# Patient Record
Sex: Female | Born: 1935
Health system: Southern US, Community
[De-identification: ages and names within clinical notes are randomized; demographics above are authoritative.]

## PROBLEM LIST (undated history)

## (undated) ENCOUNTER — Ambulatory Visit (HOSPITAL_COMMUNITY): Payer: Medicare Other

## (undated) DIAGNOSIS — I480 Paroxysmal atrial fibrillation: Secondary | ICD-10-CM

## (undated) DIAGNOSIS — E039 Hypothyroidism, unspecified: Secondary | ICD-10-CM

## (undated) DIAGNOSIS — R9430 Abnormal result of cardiovascular function study, unspecified: Secondary | ICD-10-CM

## (undated) DIAGNOSIS — Z950 Presence of cardiac pacemaker: Secondary | ICD-10-CM

## (undated) DIAGNOSIS — I10 Essential (primary) hypertension: Secondary | ICD-10-CM

## (undated) DIAGNOSIS — IMO0002 Reserved for concepts with insufficient information to code with codable children: Secondary | ICD-10-CM

## (undated) DIAGNOSIS — I4819 Other persistent atrial fibrillation: Secondary | ICD-10-CM

## (undated) DIAGNOSIS — L659 Nonscarring hair loss, unspecified: Secondary | ICD-10-CM

## (undated) DIAGNOSIS — I495 Sick sinus syndrome: Secondary | ICD-10-CM

## (undated) HISTORY — DX: Other persistent atrial fibrillation: I48.19

## (undated) HISTORY — DX: Paroxysmal atrial fibrillation: I48.0

## (undated) HISTORY — DX: Sick sinus syndrome: I49.5

## (undated) HISTORY — PX: JOINT REPLACEMENT: SHX530

## (undated) HISTORY — DX: Essential (primary) hypertension: I10

## (undated) HISTORY — DX: Abnormal result of cardiovascular function study, unspecified: R94.30

## (undated) HISTORY — PX: ABDOMINAL HYSTERECTOMY: SHX81

## (undated) HISTORY — DX: Nonscarring hair loss, unspecified: L65.9

## (undated) HISTORY — DX: Hypothyroidism, unspecified: E03.9

## (undated) HISTORY — DX: Reserved for concepts with insufficient information to code with codable children: IMO0002

---

## 2000-01-01 ENCOUNTER — Ambulatory Visit (HOSPITAL_COMMUNITY): Admission: RE | Admit: 2000-01-01 | Discharge: 2000-01-01 | Payer: Self-pay | Admitting: *Deleted

## 2001-02-18 ENCOUNTER — Encounter: Admission: RE | Admit: 2001-02-18 | Discharge: 2001-02-18 | Payer: Self-pay | Admitting: Family Medicine

## 2001-02-18 ENCOUNTER — Encounter: Payer: Self-pay | Admitting: Family Medicine

## 2001-08-15 ENCOUNTER — Ambulatory Visit (HOSPITAL_COMMUNITY): Admission: RE | Admit: 2001-08-15 | Discharge: 2001-08-15 | Payer: Self-pay | Admitting: Gastroenterology

## 2001-08-15 ENCOUNTER — Encounter (INDEPENDENT_AMBULATORY_CARE_PROVIDER_SITE_OTHER): Payer: Self-pay | Admitting: Specialist

## 2002-09-08 ENCOUNTER — Encounter: Admission: RE | Admit: 2002-09-08 | Discharge: 2002-09-08 | Payer: Self-pay | Admitting: *Deleted

## 2002-09-08 ENCOUNTER — Encounter: Payer: Self-pay | Admitting: *Deleted

## 2003-04-11 ENCOUNTER — Emergency Department (HOSPITAL_COMMUNITY): Admission: EM | Admit: 2003-04-11 | Discharge: 2003-04-11 | Payer: Self-pay | Admitting: Emergency Medicine

## 2003-05-19 ENCOUNTER — Emergency Department (HOSPITAL_COMMUNITY): Admission: EM | Admit: 2003-05-19 | Discharge: 2003-05-19 | Payer: Self-pay | Admitting: Emergency Medicine

## 2003-09-25 ENCOUNTER — Encounter: Admission: RE | Admit: 2003-09-25 | Discharge: 2003-09-25 | Payer: Self-pay | Admitting: Orthopedic Surgery

## 2003-10-14 ENCOUNTER — Ambulatory Visit (HOSPITAL_COMMUNITY): Admission: RE | Admit: 2003-10-14 | Discharge: 2003-10-14 | Payer: Self-pay | Admitting: Orthopedic Surgery

## 2004-07-05 ENCOUNTER — Ambulatory Visit: Payer: Self-pay | Admitting: Cardiology

## 2005-02-21 ENCOUNTER — Observation Stay (HOSPITAL_COMMUNITY): Admission: RE | Admit: 2005-02-21 | Discharge: 2005-02-22 | Payer: Self-pay | Admitting: Obstetrics and Gynecology

## 2005-07-09 ENCOUNTER — Ambulatory Visit: Payer: Self-pay | Admitting: Cardiology

## 2006-06-17 ENCOUNTER — Ambulatory Visit: Payer: Self-pay | Admitting: Cardiology

## 2007-06-12 HISTORY — PX: PACEMAKER PLACEMENT: SHX43

## 2007-06-16 ENCOUNTER — Ambulatory Visit: Payer: Self-pay | Admitting: Cardiology

## 2007-07-10 ENCOUNTER — Ambulatory Visit (HOSPITAL_COMMUNITY): Admission: RE | Admit: 2007-07-10 | Discharge: 2007-07-10 | Payer: Self-pay | Admitting: Internal Medicine

## 2008-03-05 ENCOUNTER — Observation Stay (HOSPITAL_COMMUNITY): Admission: EM | Admit: 2008-03-05 | Discharge: 2008-03-06 | Payer: Self-pay | Admitting: Emergency Medicine

## 2008-03-05 ENCOUNTER — Ambulatory Visit: Payer: Self-pay | Admitting: Cardiology

## 2008-03-17 ENCOUNTER — Ambulatory Visit: Payer: Self-pay

## 2008-03-17 ENCOUNTER — Encounter: Payer: Self-pay | Admitting: Cardiology

## 2008-03-22 ENCOUNTER — Ambulatory Visit: Payer: Self-pay | Admitting: Cardiology

## 2008-03-23 ENCOUNTER — Ambulatory Visit: Payer: Self-pay | Admitting: Cardiology

## 2008-04-09 ENCOUNTER — Ambulatory Visit: Payer: Self-pay | Admitting: Cardiology

## 2008-04-12 ENCOUNTER — Ambulatory Visit: Payer: Self-pay | Admitting: Internal Medicine

## 2008-04-12 LAB — CONVERTED CEMR LAB
BUN: 15 mg/dL (ref 6–23)
Basophils Absolute: 0.1 10*3/uL (ref 0.0–0.1)
Basophils Relative: 0.8 % (ref 0.0–3.0)
CO2: 32 meq/L (ref 19–32)
Calcium: 9.2 mg/dL (ref 8.4–10.5)
Chloride: 104 meq/L (ref 96–112)
Creatinine, Ser: 0.9 mg/dL (ref 0.4–1.2)
Eosinophils Absolute: 0.4 10*3/uL (ref 0.0–0.7)
Eosinophils Relative: 6.4 % — ABNORMAL HIGH (ref 0.0–5.0)
GFR calc Af Amer: 78 mL/min
GFR calc non Af Amer: 64 mL/min
Glucose, Bld: 85 mg/dL (ref 70–99)
HCT: 41 % (ref 36.0–46.0)
Hemoglobin: 14.5 g/dL (ref 12.0–15.0)
INR: 0.9 (ref 0.8–1.0)
Lymphocytes Relative: 30.8 % (ref 12.0–46.0)
MCHC: 35.3 g/dL (ref 30.0–36.0)
MCV: 93.7 fL (ref 78.0–100.0)
Monocytes Absolute: 0.9 10*3/uL (ref 0.1–1.0)
Monocytes Relative: 14.1 % — ABNORMAL HIGH (ref 3.0–12.0)
Neutro Abs: 3 10*3/uL (ref 1.4–7.7)
Neutrophils Relative %: 47.9 % (ref 43.0–77.0)
Platelets: 233 10*3/uL (ref 150–400)
Potassium: 3.9 meq/L (ref 3.5–5.1)
Prothrombin Time: 11 s (ref 10.9–13.3)
RBC: 4.37 M/uL (ref 3.87–5.11)
RDW: 11.8 % (ref 11.5–14.6)
Sodium: 141 meq/L (ref 135–145)
WBC: 6.4 10*3/uL (ref 4.5–10.5)
aPTT: 26.2 s (ref 21.7–29.8)

## 2008-04-16 ENCOUNTER — Ambulatory Visit: Payer: Self-pay | Admitting: Cardiology

## 2008-04-19 ENCOUNTER — Ambulatory Visit: Payer: Self-pay | Admitting: Internal Medicine

## 2008-04-20 ENCOUNTER — Inpatient Hospital Stay (HOSPITAL_COMMUNITY): Admission: RE | Admit: 2008-04-20 | Discharge: 2008-04-20 | Payer: Self-pay | Admitting: Internal Medicine

## 2008-04-21 ENCOUNTER — Ambulatory Visit: Payer: Self-pay | Admitting: Internal Medicine

## 2008-04-26 ENCOUNTER — Ambulatory Visit: Payer: Self-pay | Admitting: Cardiology

## 2008-04-26 ENCOUNTER — Ambulatory Visit: Payer: Self-pay

## 2008-05-10 ENCOUNTER — Ambulatory Visit: Payer: Self-pay

## 2008-05-10 ENCOUNTER — Ambulatory Visit: Payer: Self-pay | Admitting: Cardiovascular Disease

## 2008-05-19 ENCOUNTER — Ambulatory Visit: Payer: Self-pay | Admitting: Cardiology

## 2008-05-24 ENCOUNTER — Ambulatory Visit: Payer: Self-pay | Admitting: Cardiology

## 2008-06-07 ENCOUNTER — Ambulatory Visit: Payer: Self-pay | Admitting: Cardiology

## 2008-06-17 ENCOUNTER — Ambulatory Visit: Payer: Self-pay | Admitting: Cardiovascular Disease

## 2008-06-21 ENCOUNTER — Ambulatory Visit: Payer: Self-pay | Admitting: Cardiology

## 2008-07-05 ENCOUNTER — Ambulatory Visit: Payer: Self-pay | Admitting: Cardiology

## 2008-07-20 ENCOUNTER — Ambulatory Visit: Payer: Self-pay | Admitting: Internal Medicine

## 2008-08-02 ENCOUNTER — Ambulatory Visit: Payer: Self-pay | Admitting: Cardiology

## 2008-08-16 ENCOUNTER — Ambulatory Visit: Payer: Self-pay | Admitting: Cardiology

## 2008-08-23 ENCOUNTER — Ambulatory Visit: Payer: Self-pay | Admitting: Cardiology

## 2008-09-06 ENCOUNTER — Ambulatory Visit: Payer: Self-pay | Admitting: Cardiology

## 2008-09-27 ENCOUNTER — Telehealth: Payer: Self-pay | Admitting: Cardiology

## 2008-09-27 ENCOUNTER — Ambulatory Visit: Payer: Self-pay | Admitting: Cardiology

## 2008-10-01 ENCOUNTER — Encounter (INDEPENDENT_AMBULATORY_CARE_PROVIDER_SITE_OTHER): Payer: Self-pay | Admitting: *Deleted

## 2008-10-04 ENCOUNTER — Ambulatory Visit: Payer: Self-pay | Admitting: Cardiology

## 2008-10-25 ENCOUNTER — Ambulatory Visit: Payer: Self-pay | Admitting: Cardiovascular Disease

## 2008-11-05 ENCOUNTER — Encounter: Payer: Self-pay | Admitting: Cardiology

## 2008-11-09 ENCOUNTER — Encounter: Payer: Self-pay | Admitting: *Deleted

## 2008-11-22 ENCOUNTER — Ambulatory Visit: Payer: Self-pay | Admitting: Cardiology

## 2008-11-22 LAB — CONVERTED CEMR LAB
POC INR: 2.7
Protime: 19.8

## 2008-12-15 ENCOUNTER — Encounter: Payer: Self-pay | Admitting: *Deleted

## 2008-12-20 ENCOUNTER — Encounter (INDEPENDENT_AMBULATORY_CARE_PROVIDER_SITE_OTHER): Payer: Self-pay | Admitting: Cardiology

## 2008-12-20 ENCOUNTER — Ambulatory Visit: Payer: Self-pay | Admitting: Cardiovascular Disease

## 2008-12-20 LAB — CONVERTED CEMR LAB
POC INR: 2.1
Prothrombin Time: 17.9 s

## 2009-01-17 ENCOUNTER — Ambulatory Visit: Payer: Self-pay | Admitting: Cardiology

## 2009-01-17 LAB — CONVERTED CEMR LAB
POC INR: 3
Prothrombin Time: 20.8 s

## 2009-02-07 ENCOUNTER — Encounter: Payer: Self-pay | Admitting: Cardiology

## 2009-02-09 ENCOUNTER — Ambulatory Visit: Payer: Self-pay | Admitting: Cardiology

## 2009-02-09 LAB — CONVERTED CEMR LAB: POC INR: 4

## 2009-02-23 ENCOUNTER — Ambulatory Visit: Payer: Self-pay | Admitting: Cardiology

## 2009-02-23 LAB — CONVERTED CEMR LAB: POC INR: 2.7

## 2009-03-23 ENCOUNTER — Ambulatory Visit: Payer: Self-pay | Admitting: Cardiology

## 2009-03-23 LAB — CONVERTED CEMR LAB: POC INR: 2.7

## 2009-04-11 ENCOUNTER — Telehealth: Payer: Self-pay | Admitting: Cardiology

## 2009-04-20 ENCOUNTER — Ambulatory Visit: Payer: Self-pay | Admitting: Internal Medicine

## 2009-04-20 LAB — CONVERTED CEMR LAB: POC INR: 2.5

## 2009-05-10 ENCOUNTER — Telehealth (INDEPENDENT_AMBULATORY_CARE_PROVIDER_SITE_OTHER): Payer: Self-pay | Admitting: *Deleted

## 2009-05-18 ENCOUNTER — Ambulatory Visit: Payer: Self-pay | Admitting: Cardiology

## 2009-05-18 LAB — CONVERTED CEMR LAB: POC INR: 2.5

## 2009-06-10 ENCOUNTER — Encounter (INDEPENDENT_AMBULATORY_CARE_PROVIDER_SITE_OTHER): Payer: Self-pay | Admitting: Cardiology

## 2009-06-14 ENCOUNTER — Encounter: Payer: Self-pay | Admitting: Cardiology

## 2009-06-14 LAB — CONVERTED CEMR LAB
POC INR: 3.8
Prothrombin Time: 37.2 s

## 2009-06-15 ENCOUNTER — Ambulatory Visit: Payer: Self-pay | Admitting: Cardiovascular Disease

## 2009-06-15 LAB — CONVERTED CEMR LAB: POC INR: 3.2

## 2009-07-06 ENCOUNTER — Ambulatory Visit: Payer: Self-pay | Admitting: Cardiology

## 2009-07-06 LAB — CONVERTED CEMR LAB: POC INR: 3.4

## 2009-07-15 ENCOUNTER — Ambulatory Visit: Payer: Self-pay | Admitting: Internal Medicine

## 2009-07-15 LAB — CONVERTED CEMR LAB: POC INR: 2

## 2009-08-05 ENCOUNTER — Ambulatory Visit: Payer: Self-pay | Admitting: Cardiology

## 2009-08-05 LAB — CONVERTED CEMR LAB: POC INR: 2.5

## 2009-08-16 ENCOUNTER — Ambulatory Visit: Payer: Self-pay | Admitting: Cardiology

## 2009-08-31 ENCOUNTER — Telehealth: Payer: Self-pay | Admitting: Cardiology

## 2009-09-05 ENCOUNTER — Ambulatory Visit: Payer: Self-pay | Admitting: Internal Medicine

## 2009-09-05 LAB — CONVERTED CEMR LAB: POC INR: 1.2

## 2009-09-08 ENCOUNTER — Telehealth: Payer: Self-pay | Admitting: Cardiology

## 2009-09-15 ENCOUNTER — Ambulatory Visit: Payer: Self-pay | Admitting: Cardiology

## 2009-09-19 LAB — CONVERTED CEMR LAB
Basophils Absolute: 0 10*3/uL (ref 0.0–0.1)
Basophils Relative: 0.9 % (ref 0.0–3.0)
Eosinophils Absolute: 0.4 10*3/uL (ref 0.0–0.7)
Eosinophils Relative: 8.6 % — ABNORMAL HIGH (ref 0.0–5.0)
HCT: 41.5 % (ref 36.0–46.0)
Hemoglobin: 14.1 g/dL (ref 12.0–15.0)
Lymphocytes Relative: 32.6 % (ref 12.0–46.0)
Lymphs Abs: 1.6 10*3/uL (ref 0.7–4.0)
MCHC: 34.1 g/dL (ref 30.0–36.0)
MCV: 95.5 fL (ref 78.0–100.0)
Monocytes Absolute: 0.7 10*3/uL (ref 0.1–1.0)
Monocytes Relative: 13.9 % — ABNORMAL HIGH (ref 3.0–12.0)
Neutro Abs: 2.1 10*3/uL (ref 1.4–7.7)
Neutrophils Relative %: 44 % (ref 43.0–77.0)
Platelets: 218 10*3/uL (ref 150.0–400.0)
RBC: 4.35 M/uL (ref 3.87–5.11)
RDW: 13.7 % (ref 11.5–14.6)
WBC: 4.8 10*3/uL (ref 4.5–10.5)

## 2009-11-14 ENCOUNTER — Telehealth: Payer: Self-pay | Admitting: Internal Medicine

## 2009-11-21 ENCOUNTER — Emergency Department (HOSPITAL_COMMUNITY)
Admission: EM | Admit: 2009-11-21 | Discharge: 2009-11-21 | Payer: Self-pay | Source: Home / Self Care | Admitting: Emergency Medicine

## 2009-11-23 ENCOUNTER — Telehealth: Payer: Self-pay | Admitting: Cardiology

## 2010-01-03 ENCOUNTER — Telehealth: Payer: Self-pay | Admitting: Cardiology

## 2010-02-09 ENCOUNTER — Encounter: Payer: Self-pay | Admitting: Cardiology

## 2010-02-10 ENCOUNTER — Ambulatory Visit: Payer: Self-pay | Admitting: Cardiology

## 2010-02-10 ENCOUNTER — Encounter: Payer: Self-pay | Admitting: Internal Medicine

## 2010-02-15 ENCOUNTER — Telehealth: Payer: Self-pay | Admitting: Cardiology

## 2010-02-16 ENCOUNTER — Telehealth: Payer: Self-pay | Admitting: Cardiology

## 2010-03-06 ENCOUNTER — Ambulatory Visit: Payer: Self-pay | Admitting: Cardiology

## 2010-04-24 ENCOUNTER — Telehealth: Payer: Self-pay | Admitting: Cardiology

## 2010-05-10 ENCOUNTER — Encounter: Admission: RE | Admit: 2010-05-10 | Discharge: 2010-05-10 | Payer: Self-pay | Admitting: Internal Medicine

## 2010-06-15 ENCOUNTER — Ambulatory Visit
Admission: RE | Admit: 2010-06-15 | Discharge: 2010-06-15 | Payer: Self-pay | Source: Home / Self Care | Attending: Cardiology | Admitting: Cardiology

## 2010-06-19 ENCOUNTER — Telehealth: Payer: Self-pay | Admitting: Cardiology

## 2010-06-20 ENCOUNTER — Telehealth: Payer: Self-pay | Admitting: Cardiology

## 2010-07-09 LAB — CONVERTED CEMR LAB
Basophils Absolute: 0 10*3/uL (ref 0.0–0.1)
Basophils Relative: 0.8 % (ref 0.0–3.0)
Eosinophils Absolute: 0.5 10*3/uL (ref 0.0–0.7)
Eosinophils Relative: 7.9 % — ABNORMAL HIGH (ref 0.0–5.0)
HCT: 38.5 % (ref 36.0–46.0)
Hemoglobin: 13.4 g/dL (ref 12.0–15.0)
Lymphocytes Relative: 29.8 % (ref 12.0–46.0)
Lymphs Abs: 1.8 10*3/uL (ref 0.7–4.0)
MCHC: 34.8 g/dL (ref 30.0–36.0)
MCV: 95.6 fL (ref 78.0–100.0)
Monocytes Absolute: 0.8 10*3/uL (ref 0.1–1.0)
Monocytes Relative: 13.3 % — ABNORMAL HIGH (ref 3.0–12.0)
Neutro Abs: 2.8 10*3/uL (ref 1.4–7.7)
Neutrophils Relative %: 48.2 % (ref 43.0–77.0)
Platelets: 212 10*3/uL (ref 150.0–400.0)
RBC: 4.03 M/uL (ref 3.87–5.11)
RDW: 13.4 % (ref 11.5–14.6)
TSH: 0.59 microintl units/mL (ref 0.35–5.50)
WBC: 5.9 10*3/uL (ref 4.5–10.5)

## 2010-07-13 NOTE — Progress Notes (Signed)
Summary: refill request  Phone Note Refill Request Message from:  Patient on June 19, 2010 1:54 PM  Refills Requested: Medication #1:  PRADAXA 150 MG CAPS Take 1 tablet by mouth two times a day pt needs 90 day supply with a year of refills   Method Requested: Telephone to Pharmacy Initial call taken by: Glynda Jaeger,  June 19, 2010 1:55 PM    Prescriptions: PRADAXA 150 MG CAPS (DABIGATRAN ETEXILATE MESYLATE) Take 1 tablet by mouth two times a day  #180 x 3   Entered by:   Hardin Negus, RMA   Authorized by:   Talitha Givens, MD, [facility]   Signed by:   Hardin Negus, RMA on 06/20/2010   Method used:   [facility]  [address] [id number]       [address]       [place], [place]  [address]       Ph: [phone] or [phone]       Fax: [phone]   RxID:   [id number]

## 2010-07-13 NOTE — Progress Notes (Signed)
Summary: CALLING ABOUT   MEDICATION  Phone Note Call from Patient Call back at Home Phone [phone]   Caller: Patient Summary of Call: PT REQUEST CALL ABOUT A MEDICATION Initial call taken by: Judie Grieve,  August 31, 2009 1:42 PM  Follow-up for Phone Call        Called spoke with pt.  Pt has received Pradaxa in the mail.  Wishes to stop coumadin and start Pradaxa.  Instructed pt must be off coumadin x 3 days and have INR less than 2.0.  Instructed pt to take coumadin until 09/01/09.  Hold coumadin 3/25-3/28, made OV on 09/05/09 at 9:30 to check INR.  Follow-up by: Cloyde Reams RN,  August 31, 2009 2:14 PM

## 2010-07-13 NOTE — Progress Notes (Signed)
Summary: pt has questions about pacer  Phone Note Call from Patient Call back at Home Phone [phone]   Caller: Patient Reason for Call: Talk to Nurse, Talk to Doctor Summary of Call: pt has questions regarding pacer Initial call taken by: Omer Jack,  November 14, 2009 9:05 AM  Follow-up for Phone Call        Called patient and left message on machine Gypsy Balsam RN BSN  November 14, 2009 4:10 PM   Additional Follow-up for Phone Call Additional follow up Details #1::        [name] was home from [place] and had to go back a day early and patient walked through metal detector at airport.  Pt was concerned that it had affected her device.  Pt reassured.  Gypsy Balsam RN BSN  November 14, 2009 4:19 PM

## 2010-07-13 NOTE — Progress Notes (Signed)
Summary: pt wanting appt  Phone Note Call from Patient Call back at Home Phone [phone]   Caller: Patient [phone] Reason for Call: Talk to Nurse Complaint: Nausea/Vomiting/Diarrhea Summary of Call: pt calling re er visit last night bp 200/96 they didn't find anything wrong and sent her home w.instructions to see her pc/she has appt with dr Valentina Lucks tomorrow at 145p/now has dizziness,nausea, and some weakness, she however didn't tell the pc that, her brother suggested she cll and see dr Myrtis Ser today-pt advised he is not here Initial call taken by: Glynda Jaeger,  November 23, 2009 9:04 AM  Follow-up for Phone Call        spoke w/pt she will keep appt w/Dr Valentina Lucks and call us tom and let us know what he did Meredith Staggers, RN  November 23, 2009 1:49 PM

## 2010-07-13 NOTE — Assessment & Plan Note (Signed)
Summary: [name]   Visit Type:  Follow-up Primary Provider:  Kirby Funk, MD  CC:  atrial fibrillation.  History of Present Illness: The patient is seen for followup of atrial fibrillation.  She is doing well.  She has noted episodes of palpitations.  She also tells me about an episode of increased blood pressure. .  This was treated with the addition of an ACE inhibitor and then Losartan.  The pacemaker is interrogated today.  It is functioning properly.  She has had 12 episodes in the last 200 days with a burst of rapid atrial fibrillation as high as 190 beats per minute.  These spells have lasted as long as 12 minutes.  She feels this but has not had syncope or presyncope.  Current Medications (verified): 1)  Levoxyl 150 Mcg Tabs (Levothyroxine Sodium) .... Take 1 Tablet By Mouth Daily 2)  Metoprolol Succinate 25 Mg Xr24h-Tab (Metoprolol Succinate) .... Take One Tablet By Mouth Daily 3)  Rythmol Sr 225 Mg Xr12h-Cap (Propafenone Hcl) .Marland Kitchen.. 1 Tab Two Times A Day 4)  Calcium Carbonate-Vitamin D 600-400 Mg-Unit  Tabs (Calcium Carbonate-Vitamin D) .... Take 1 Tablet By Mouth Daily 5)  Fish Oil   Oil (Fish Oil) .... 1000mg  1 Tab Once Daily 6)  Ocuvite  Tabs (Multiple Vitamins-Minerals) .... Take 1 Tablet By Mouth Daily 7)  Niacin 500 Mg Tabs (Niacin) .... Take 1 Tablet By Mouth Daily 8)  Losartan Potassium-Hctz 100-25 Mg Tabs (Losartan Potassium-Hctz) .... Take 1 Tablet By Mouth Once A Day 9)  Vitamin D 400 Unit  Tabs (Cholecalciferol) .... Once Daily 10)  Pradaxa 150 Mg Caps (Dabigatran Etexilate Mesylate) .... Take 1 Tablet By Mouth Two Times A Day 11)  Voltaren 1 % Gel (Diclofenac Sodium) .... Two Times A Day  Allergies (verified): 1)  ! Lisinopril 2)  ! Penicillin  Past History:  Past Medical History: Hysterectomy * RHYTHMOL RX COUMADIN THERAPY (ICD-V58.61)  //  switch to pradaxa  March, 2011... patient request BRADYCARDIA-TACHYCARDIA SYNDROME (ICD-427.81) PACEMAKER,  PERMANENT (ICD-V45.01) HYPOTHYROIDISM (ICD-244.9) ATRIAL FIBRILLATION (ICD-427.31)  ..episodes of rapid atrial fib with pacemaker interrogation.. August, 2011 Cough... chronic component and ACE inhibitor component Hair loss... patient feels may be Coumadin related EF  70%... echo.... October, 2009.... vigorous LV function Nuclear   November, 2009...Marland KitchenMarland Kitchen no ischemia.... shifting breast attenuation.  Review of Systems       Patient denies fever, chills, headache, sweats, rash, change in vision, change in hearing, chest pain, cough, nausea vomiting, urinary symptoms.  All of the systems are reviewed and are negative.  Vital Signs:  Patient profile:   77 year old female Height:      64 inches Weight:      190 pounds BMI:     32.73 Pulse rate:   60 / minute BP sitting:   100 / 50  (right arm) Cuff size:   regular  Vitals Entered By: Hardin Negus, RMA (February 10, 2010 2:13 PM)  Physical Exam  General:  patient is stable today. Head:  head is atraumatic. Eyes:  no xanthelasma. Neck:  no jugular venous distention. Chest Wall:  no chest wall tenderness. Lungs:  lungs are clear.  Respiratory effort is nonlabored. Heart:  cardiac exam reveals S1-S2.  No clicks or significant murmurs. Abdomen:  abdomen is soft. Msk:  no musculoskeletal deformities. Extremities:  no peripheral edema. Skin:    No skin rashes. Psych:  patient is oriented to person time and place.  Affect is normal.   PPM  Specifications Following MD:  Belva Crome     PPM Vendor:  [employer]     PPM Model Number:  [id number]     PPM Serial Number:  [id number] PPM DOI:  04/29/2008     PPM Implanting MD:  [name], MD  Lead 1    Location: [facility]     DOI: 04/19/2008     Model #: S45     Serial #: [id number]     Status: active Lead 2    Location: [facility]     DOI: 04/19/2008     Model #: ST60     Serial #: [id number]     Status: active   Indications:  Tachy brady syndrome   Episodes Coumadin:  Yes  Parameters Mode:  DDD/CLS      Lower Rate Limit:  60     Upper Rate Limit:  120 Paced AV Delay:  250     Impression & Recommendations:  Problem # 1:  * RHYTHMOL RX The patient will continue on Rythmol.  I am considering increasing the dose to try to stop the episodes of atrial fib.  However I will not do this until she has better blockade of the AV node.  Problem # 2:  COUMADIN THERAPY (ICD-V58.61) The patient is actually on Pradaxa now.  She's stable with this.  Problem # 3:  BRADYCARDIA-TACHYCARDIA SYNDROME (ICD-427.81)  The following medications were removed from the medication list:    Warfarin Sodium 5 Mg Tabs (Warfarin sodium) ..... Use as directed by anticoagulation clinic Her updated medication list for this problem includes:    Metoprolol Succinate 25 Mg Xr24h-tab (Metoprolol succinate) .Marland Kitchen... Take one tablet by mouth daily    Rythmol Sr 225 Mg Xr12h-cap (Propafenone hcl) .Marland Kitchen... 1 tab two times a day    Cardizem Cd 240 Mg Xr24h-cap (Diltiazem hcl coated beads) .Marland Kitchen... Take 1 tablet by mouth once a day The patient has episodes of rapid atrial fibrillation.  We will take her off losartan and start Cardizem CD.  Her blood pressure will be watched.  She had been on some hydrocodone Dyazide with the Cozaar I will see her for early followup.  At that time we will interrogate her pacer in to see if she's having episodes and how fast they are.  I will then decide if the Rythmol dose should be adjusted  Problem # 4:  HYPOTHYROIDISM (ICD-244.9)  Her updated medication list for this problem includes:    Levoxyl 150 Mcg Tabs (Levothyroxine sodium) .Marland Kitchen... Take 1 tablet by mouth daily The patient's thyroid history.  TSH will be checked today to be sure that there is no suggestion that her dose has become too high for her.  Orders: TLB-TSH (Thyroid Stimulating Hormone) (84443-TSH)  Other Orders: TLB-CBC Platelet - w/Differential (85025-CBCD)  Patient Instructions: 1)  Your physician recommends that you schedule a follow-up  appointment in: 3 weeks 2)  Your physician has recommended you make the following change in your medication: please discontinue losartan/HCTZ and start cardiazem CD --1 tablet daily Prescriptions: CARDIZEM CD 240 MG XR24H-CAP (DILTIAZEM HCL COATED BEADS) Take 1 tablet by mouth once a day  #90 x 3   Entered by:   Ledon Snare, RN   Authorized by:   Talitha Givens, MD, [facility]   Signed by:   Ledon Snare, RN on 02/10/2010   Method used:   [facility]  [address] [phone]  [address]       [place], [place]  [address]       Ph: [phone] or [phone]       Fax: [phone]   RxID:   [id number]

## 2010-07-13 NOTE — Progress Notes (Signed)
Summary: needs labwork  Phone Note Outgoing Call   Call placed by: Meredith Staggers, RN,  September 08, 2009 10:31 AM Call placed to: Patient Summary of Call: pt started Pradaxa 3/28, needs CBC on 4/7 have called pt and Left message to call back   Follow-up for Phone Call        pt called back, lab sch for 4/7 at [facility], RN  September 08, 2009 2:46 PM

## 2010-07-13 NOTE — Assessment & Plan Note (Signed)
Summary: per check out/sf   Visit Type:  Follow-up Primary [name]:  [name] Funk, MD  CC:  atrial fibrillation.  History of Present Illness: The patient returns today for followup of atrial fibrillation.  I saw her last on February 10, 2010.  At that time she was having episodes of palpitations.  She also had unusual sensation of discomfort in her chest it did not sound like angina.  Interrogation of her pacemaker revealed that over 200 days she had 12 episodes of rapid bursts of atrial fib.  The longus was 12 minutes.  The rate was fast.  I chose to check her TSH which was normal.  Hemoglobin was 13.4.  Losartan hydrochlorothiazide was changed to Cardizem CD.  She had some mild swelling and we add hydrochlorothiazide back.  She feels much better.  She has not had any rapid palpitations.  Current Medications (verified): 1)  Levoxyl 150 Mcg Tabs (Levothyroxine Sodium) .... Take 1 Tablet By Mouth Daily 2)  Metoprolol Succinate 25 Mg Xr24h-Tab (Metoprolol Succinate) .... Take One Tablet By Mouth Daily 3)  Rythmol Sr 225 Mg Xr12h-Cap (Propafenone Hcl) .Marland Kitchen.. 1 Tab Two Times A Day 4)  Calcium Carbonate-Vitamin D 600-400 Mg-Unit  Tabs (Calcium Carbonate-Vitamin D) .... Take 1 Tablet By Mouth Daily 5)  Fish Oil   Oil (Fish Oil) .... 1000mg  1 Tab Once Daily 6)  Ocuvite  Tabs (Multiple Vitamins-Minerals) .... Take 1 Tablet By Mouth Daily 7)  Niacin 500 Mg Tabs (Niacin) .... Take 1 Tablet By Mouth Daily 8)  Vitamin D 400 Unit  Tabs (Cholecalciferol) .... Once Daily 9)  Pradaxa 150 Mg Caps (Dabigatran Etexilate Mesylate) .... Take 1 Tablet By Mouth Two Times A Day 10)  Voltaren 1 % Gel (Diclofenac Sodium) .... Two Times A Day 11)  Cardizem Cd 240 Mg Xr24h-Cap (Diltiazem Hcl Coated Beads) .... Take 1 Tablet By Mouth Once A Day 12)  Hydrochlorothiazide 25 Mg Tabs (Hydrochlorothiazide) .... Take One Tablet By Mouth Daily.  Allergies (verified): 1)  ! Lisinopril 2)  ! Penicillin  Past History:  Past  Medical History: Hysterectomy * RHYTHMOL RX COUMADIN THERAPY (ICD-V58.61)  //  switch to pradaxa  March, 2011... patient request BRADYCARDIA-TACHYCARDIA SYNDROME (ICD-427.81) PACEMAKER, PERMANENT (ICD-V45.01) HYPOTHYROIDISM (ICD-244.9) ATRIAL FIBRILLATION (ICD-427.31)  ..episodes of rapid atrial fib with pacemaker interrogation.. August, 2011..diltiazem added.... patient improved Cough... chronic component and ACE inhibitor component Hair loss... patient feels may be Coumadin related EF  70%... echo.... October, 2009.... vigorous LV function Nuclear   November, 2009...Marland KitchenMarland Kitchen no ischemia.... shifting breast attenuation.  Review of Systems       Patient denies fever, chills, headache, sweats, rash, change in vision, change in hearing, chest pain, cough, nausea vomiting, urinary symptoms.  All other systems are reviewed and are negative.  Vital Signs:  Patient profile:   77 year old female Height:      64 inches Weight:      192 pounds BMI:     33.08 Pulse rate:   70 / minute BP sitting:   128 / 66  (left arm) Cuff size:   regular  Vitals Entered By: [name], RMA (March 06, 2010 2:13 PM)  Physical Exam  General:  patient is stable today. Eyes:  no xanthelasma. Neck:  no jugular venous distention. Lungs:  lungs are clear.  Respiratory effort is nonlabored. Heart:  cardiac exam reveals S1 and S2.  No clicks or significant murmurs. Abdomen:  abdomen is soft. Extremities:  no peripheral edema. Psych:  patient is  oriented to person time and place.  Affect is normal.   PPM Specifications Following MD:  Belva Crome     PPM Vendor:  [employer]     PPM Model Number:  [id number]     PPM Serial Number:  [id number] PPM DOI:  04/29/2008     PPM Implanting MD:  [name], MD  Lead 1    Location: [facility]     DOI: 04/19/2008     Model #: S45     Serial #: [id number]     Status: active Lead 2    Location: [facility]     DOI: 04/19/2008     Model #: ST60     Serial #: [id number]     Status:  active   Indications:  Tachy brady syndrome   PPM Follow Up Pacer Dependent:  No      Episodes Coumadin:  Yes  Parameters Mode:  DDD/CLS     Lower Rate Limit:  60     Upper Rate Limit:  120 Paced AV Delay:  250     Impression & Recommendations:  Problem # 1:  * RHYTHMOL RX The patient is greatly improved with the addition of Cardizem.  At the time of the last visit I had considered interrogating the pacemaker again in considering a higher dose of the medication.  Clinically this does not appear to be necessary.  I would rather not raise the dose if not need.  Her pacemaker is not be interrogated today.  Problem # 2:  ATRIAL FIBRILLATION (ICD-427.31)  Her updated medication list for this problem includes:    Metoprolol Succinate 25 Mg Xr24h-tab (Metoprolol succinate) .Marland Kitchen... Take one tablet by mouth daily    Rythmol Sr 225 Mg Xr12h-cap (Propafenone hcl) .Marland Kitchen... 1 tab two times a day The patient's symptoms are much better with the addition of diltiazem.  She seems to have good blood pressure control with a combination of diltiazem and HCTZ.  No change today.  Problem # 3:  COUGH (ICD-786.2)  Her updated medication list for this problem includes:    Metoprolol Succinate 25 Mg Xr24h-tab (Metoprolol succinate) .Marland Kitchen... Take one tablet by mouth daily    Rythmol Sr 225 Mg Xr12h-cap (Propafenone hcl) .Marland Kitchen... 1 tab two times a day    Cardizem Cd 240 Mg Xr24h-cap (Diltiazem hcl coated beads) .Marland Kitchen... Take 1 tablet by mouth once a day Her cough is greatly improved.  Patient Instructions: 1)  Follow up in 3 months.

## 2010-07-13 NOTE — Miscellaneous (Signed)
  Clinical Lists Changes  Observations: Added new observation of PAST MED HX: Hysterectomy * RHYTHMOL RX COUMADIN THERAPY (ICD-V58.61)  //  switch to pradaxa  March, 2011... 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 chronic component and ACE inhibitor component Hair loss... patient feels may be Coumadin related EF  70%... echo.... October, 2009.... vigorous LV function Nuclear   November, 2009...Marland KitchenMarland Kitchen no ischemia.... shifting breast attenuation.  (02/09/2010 14:41) Added new observation of PRIMARY MD: Kirby Funk, MD (02/09/2010 14:41)       Past History:  Past Medical History: Hysterectomy * RHYTHMOL RX COUMADIN THERAPY (ICD-V58.61)  //  switch to pradaxa  March, 2011... 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 chronic component and ACE inhibitor component Hair loss... patient feels may be Coumadin related EF  70%... echo.... October, 2009.... vigorous LV function Nuclear   November, 2009...Marland KitchenMarland Kitchen no ischemia.... shifting breast attenuation.

## 2010-07-13 NOTE — Progress Notes (Signed)
Summary: Need prescription mailed to her for refills  Phone Note Call from Patient Call back at Home Phone [phone]   Caller: Patient Summary of Call: Pt calling to get a prescription for Propasenone 225mg   with a year refill with 180 tabs send  prescription to pt home. Initial call taken by: Judie Grieve,  February 16, 2010 9:08 AM  Follow-up for Phone Call        spoke w/pt she needs prescription to take to [facility], she is aware Dr Myrtis Ser will be here and sign on Mon and she will pick up then Meredith Staggers, RN  February 16, 2010 9:37 AM     Prescriptions: RYTHMOL SR 225 MG XR12H-CAP (PROPAFENONE HCL) 1 tab two times a day  #180 x 3   Entered by:   Meredith Staggers, RN   Authorized by:   Talitha Givens, MD, [facility]   Signed by:   Meredith Staggers, RN on 02/16/2010   Method used:   Print then Give to Patient   RxID:   [id number]

## 2010-07-13 NOTE — Medication Information (Signed)
Summary: rov/tm  Anticoagulant Therapy  Managed by: Cloyde Reams, RN, BSN PCP: Kirby Funk, MD Supervising MD: Shirlee Latch MD, Nedra Mcinnis Indication 1: Atrial Fibrillation (ICD-427.31) Lab Used: [facility] [place] Site: [address] INR POC 3.2 INR RANGE 2 - 3  Dietary changes: no    Health status changes: no    Bleeding/hemorrhagic complications: yes       Details: Bruising R arm and back s/p fall.  Recent/future hospitalizations: no    Any changes in medication regimen? yes       Details: Prn pain meds s/p fall.  Recent/future dental: no  Any missed doses?: yes     Details: Pt hasn't taken this am.  Had PT/INR done at Urgent Care INR 3.8.   Is patient compliant with meds? yes      Comments: Pt fell Monday 1 week ago in house went to urgent care, injured arm, cracked rib, bruised back.    Allergies (verified): 1)  ! Lisinopril 2)  ! Penicillin  Anticoagulation Management History:      The patient is taking warfarin and comes in today for a routine follow up visit.  Positive risk factors for bleeding include an age of 75 years or older.  The bleeding index is 'intermediate risk'.  Positive CHADS2 values include Age > 31 years old.  The start date was 04/09/2008.  Her last INR was 0.9 RATIO.  Anticoagulation responsible provider: Shirlee Latch MD, Giah Fickett.  INR POC: 3.2.  Cuvette Lot#: [id number].  Exp: 07/2010.    Anticoagulation Management Assessment/Plan:      The patient's current anticoagulation dose is Warfarin sodium 5 mg tabs: Use as directed by Anticoagulation Clinic, Warfarin sodium 2.5 mg tabs: Take as directed by coumadin clinic..  The target INR is 2 - 3.  The next INR is due 07/06/2009.  Anticoagulation instructions were given to patient.  Results were reviewed/authorized by Cloyde Reams, RN, BSN.  She was notified by Cloyde Reams RN.         Prior Anticoagulation Instructions: Attempted to reach pt, no answer, LMOM. Bethena Midget, RN, BSN  June 14, 2009  2:10 PM  D/w pt on 06/14/2009 at 1555...she states this test was done on Friday and she didn't receive instruction until today after  she had taken 0.5 tab.  She will hold tomorrow's dose until we see her.  Pt verbalizes understanding.    Current Anticoagulation Instructions: INR 3.2  Pt has held coumadin today per Shelby Dubin, PharmD yesterday.  Resume same dosage 5mg  daily except 2.5mg  on Tuesdays and Saturdays.  Recheck in 3 weeks.   Prescriptions: WARFARIN SODIUM 2.5 MG TABS (WARFARIN SODIUM) Take as directed by coumadin clinic.  #90 x 1   Entered by:   Cloyde Reams RN   Authorized by:   Talitha Givens, MD, [facility]   Signed by:   Cloyde Reams RN on 06/15/2009   Method used:   [facility]  [address] [id number]       [address]       [place], [place]  [address]       Ph: [phone] or [phone]       Fax: [phone]   RxID:   [id number]

## 2010-07-13 NOTE — Progress Notes (Signed)
Summary: edema  Phone Note Outgoing Call   Call placed by: Meredith Staggers, RN,  February 15, 2010 3:18 PM Summary of Call: called pt w/labs results, she states she was going to call us today her meds were changed on Fri at her ov and now she has noticed swelling in her feet, she had been on losartan/hctz and was changed to cardizem.  No sob, she does state she feels like her face is also a little swollen, will discussed w/Dr Myrtis Ser and call her back  Follow-up for Phone Call        Restart Hctz 25mg  daily.  Talitha Givens, MD, [facility]  February 15, 2010 4:42 PM  pt aware, new rx called in Meredith Staggers, RN  February 15, 2010 4:44 PM     New/Updated Medications: HYDROCHLOROTHIAZIDE 25 MG TABS (HYDROCHLOROTHIAZIDE) Take one tablet by mouth daily. Prescriptions: HYDROCHLOROTHIAZIDE 25 MG TABS (HYDROCHLOROTHIAZIDE) Take one tablet by mouth daily.  #30 x 6   Entered by:   Meredith Staggers, RN   Authorized by:   Talitha Givens, MD, [facility]   Signed by:   Meredith Staggers, RN on 02/15/2010   Method used:   [facility] [address] [facility]       [address]       [place], [place]  [address]       Ph: [phone] or [phone]       Fax: [phone]   RxID:   [id number]

## 2010-07-13 NOTE — Progress Notes (Signed)
Summary: need prescription mailed to her  Phone Note Call from Patient Call back at Home Phone [phone]   Caller: Patient Summary of Call: Pt wants a prescription for her Pradaxa mailed to her home not sent to a pharmacy.    Initial call taken by: Judie Grieve,  June 20, 2010 8:42 AM  Follow-up for Phone Call        called pt, LMTCB, Dr Myrtis Ser is not back in the office till the 17th. [facility], do we need to cancel script?  Hardin Negus, RMA  June 20, 2010 11:13 AM  Needs to mail  to the [place] .Marland Kitchen..she has mth& half left she is fine with waiting till Dr Myrtis Ser is back from vacation. i told her he would be back on the 17th and she has an appt close by so she would like to pick it up then.  Hardin Negus, RMA  June 21, 2010 3:01 PM  script is ready at the front desk for pick up   Hardin Negus, RMA  June 27, 2010 9:43 AM     Prescriptions: PRADAXA 150 MG CAPS (DABIGATRAN ETEXILATE MESYLATE) Take 1 tablet by mouth two times a day  #180 x 3   Entered by:   Hardin Negus, RMA   Authorized by:   Talitha Givens, MD, [facility]   Signed by:   Hardin Negus, RMA on 06/27/2010   Method used:   Print then Give to Patient   RxID:   [id number]

## 2010-07-13 NOTE — Assessment & Plan Note (Signed)
Summary: f43m   Visit Type:  Follow-up Primary Provider:  Kirby Funk, MD  CC:  atrial fibrillation.  History of Present Illness: The patient is seen in followup atrial fibrillation.  In September, 2011, she been having rapid palpitations.  Interrogation of her pacemaker revealed that she had had a good deal of atrial fibrillation with a rapid rate.  TSH was normal.  Her losartan was changed to Cardizem.  After that time she felt much better.  In fact she also diuresed.  She has done well since then.  She has some mild palpitations but they're very brief.  There is no chest pain.  Current Medications (verified): 1)  Levoxyl 150 Mcg Tabs (Levothyroxine Sodium) .... Take 1 Tablet By Mouth Daily 2)  Metoprolol Succinate 25 Mg Xr24h-Tab (Metoprolol Succinate) .... Take One Tablet By Mouth Daily 3)  Rythmol Sr 225 Mg Xr12h-Cap (Propafenone Hcl) .Marland Kitchen.. 1 Tab Two Times A Day 4)  Calcium Carbonate-Vitamin D 600-400 Mg-Unit  Tabs (Calcium Carbonate-Vitamin D) .... Take 1 Tablet By Mouth Daily 5)  Fish Oil   Oil (Fish Oil) .... 1000mg  1 Tab Once Daily 6)  Ocuvite  Tabs (Multiple Vitamins-Minerals) .... Take 1 Tablet By Mouth Daily 7)  Niacin 500 Mg Tabs (Niacin) .... Take 1 Tablet By Mouth Daily 8)  Vitamin D 400 Unit  Tabs (Cholecalciferol) .... Once Daily 9)  Pradaxa 150 Mg Caps (Dabigatran Etexilate Mesylate) .... Take 1 Tablet By Mouth Two Times A Day 10)  Voltaren 1 % Gel (Diclofenac Sodium) .... Two Times A Day 11)  Cardizem Cd 240 Mg Xr24h-Cap (Diltiazem Hcl Coated Beads) .... Take 1 Tablet By Mouth Once A Day 12)  Hydrochlorothiazide 25 Mg Tabs (Hydrochlorothiazide) .... Take One Tablet By Mouth Daily.  Allergies (verified): 1)  ! Lisinopril 2)  ! Penicillin  Past History:  Past Medical History: Hysterectomy * RHYTHMOL RX COUMADIN THERAPY (ICD-V58.61)  //  switch to pradaxa  March, 2011... 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 August, 2011..diltiazem added.... patient improved Cough... chronic component and ACE inhibitor component Hair loss... patient feels may be Coumadin related EF  70%... echo.... October, 2009.... vigorous LV function Nuclear   November, 2009...Marland KitchenMarland Kitchen no ischemia.... shifting breast attenuation...  Review of Systems       Patient denies fever, chills, headache, sweats, rash, change in vision, change in hearing, chest pain, cough, nausea vomiting, urinary symptoms.  All other systems are reviewed and are negative  Vital Signs:  Patient profile:   77 year old female Height:      64 inches Weight:      193 pounds BMI:     33.25 Pulse rate:   70 / minute BP sitting:   108 / 50  (left arm) Cuff size:   regular  Vitals Entered By: Hardin Negus, RMA (June 15, 2010 4:01 PM)  Physical Exam  General:  patient is stable today. Eyes:  normal extraocular motion. Neck:  no jugular venous distention. Lungs:  lungs are clear.  Respiratory effort is nonlabored. Heart:  cardiac exam reveals an S1-S2.  No clicks or significant murmurs. Abdomen:  abdomen soft. Msk:  no musculoskeletal deformities. Extremities:  no peripheral edema. Psych:  patient is oriented to person time and place.  Affect is normal.   PPM Specifications Following MD:  Belva Crome     PPM Vendor:  [employer]     PPM Model Number:  [id number]     PPM Serial Number:  [id number] DOI:  04/29/2008     PPM Implanting MD:  [name], MD  Lead 1    Location: [facility]     DOI: 04/19/2008     Model #: S45     Serial #: [id number]     Status: active Lead 2    Location: [facility]     DOI: 04/19/2008     Model #: ST60     Serial #: [id number]     Status: active   Indications:  Tachy brady syndrome   PPM Follow Up Pacer Dependent:  No      Episodes Coumadin:  Yes  Parameters Mode:  DDD/CLS     Lower Rate Limit:   60     Upper Rate Limit:  120 Paced AV Delay:  250     Impression & Recommendations:  Problem # 1:  * RHYTHMOL RX Patient continues on Rythmol.  She stable.  Problem # 2:  * PRADAXA THERAPY The patient is on pradaxa and she is doing well.  Problem # 3:  ATRIAL FIBRILLATION (ICD-427.31)  Her updated medication list for this problem includes:    Metoprolol Succinate 25 Mg Xr24h-tab (Metoprolol succinate) .Marland Kitchen... Take one tablet by mouth daily    Rythmol Sr 225 Mg Xr12h-cap (Propafenone hcl) .Marland Kitchen... 1 tab two times a day Clinically the patient is doing well with Rythmol metoprolol and Cardizem.  She will see Dr. Johney Frame for pacemaker check in February.  He can assess at that time how much atrial fib she is having and whether further medicine adjustments are needed.  I'll see her back in 3 months.  Patient Instructions: 1)  Your physician wants you to follow-up in:  3 months.  You will receive a reminder letter in the mail two months in advance. If you don't receive a letter, please call our office to schedule the follow-up appointment. Prescriptions: HYDROCHLOROTHIAZIDE 25 MG TABS (HYDROCHLOROTHIAZIDE) Take one tablet by mouth daily.  #90 x 3   Entered by:   Meredith Staggers, RN   Authorized by:   Talitha Givens, MD, [facility]   Signed by:   Meredith Staggers, RN on 06/15/2010   Method used:   Print then Give to Patient   RxID:   [id number] CARDIZEM CD 240 MG XR24H-CAP (DILTIAZEM HCL COATED BEADS) Take 1 tablet by mouth once a day  #90 x 3   Entered by:   Meredith Staggers, RN   Authorized by:   Talitha Givens, MD, [facility]   Signed by:   Meredith Staggers, RN on 06/15/2010   Method used:   Print then Give to Patient   RxID:   [id number]

## 2010-07-13 NOTE — Medication Information (Signed)
Summary: Coumadin Clinic  Anticoagulant Therapy  Managed by: Shelby Dubin PharmD, BCPS, CPP PCP: Kirby Funk, MD Supervising MD: Juanda Chance MD, Kenzli Barritt Indication 1: Atrial Fibrillation (ICD-427.31) Lab Used: [facility] [place] Site: [address] PT 37.2 INR POC 3.80 INR RANGE 2 - 3        Any missed doses?: yes     Details: Held dose per Urgent Care instruction, notified on 06/13/09.     Allergies: 1)  ! Lisinopril 2)  ! Penicillin  Anticoagulation Management History:      Her anticoagulation is being managed by telephone today.  Positive risk factors for bleeding include an age of 75 years or older.  The bleeding index is 'intermediate risk'.  Positive CHADS2 values include Age > 75 years old.  The start date was 04/09/2008.  Her last INR was 0.9 RATIO.  Prothrombin time is 37.2.  Anticoagulation responsible provider: Juanda Chance MD, Smitty Cords.  INR POC: 3.80.    Anticoagulation Management Assessment/Plan:      The patient's current anticoagulation dose is Warfarin sodium 5 mg tabs: Use as directed by Anticoagulation Clinic, Warfarin sodium 2.5 mg tabs: Take as directed by coumadin clinic..  The target INR is 2 - 3.  The next INR is due 06/15/2009.  Anticoagulation instructions were given to patient.  Results were reviewed/authorized by Shelby Dubin PharmD, BCPS, CPP.  She was notified by Shelby Dubin PharmD, BCPS, CPP.         Prior Anticoagulation Instructions: INR 2.5 Continue 5mg s daily except 2.5mg s on Tuesdays and Saturdays. Recheck in 4 weeks.   Current Anticoagulation Instructions: Attempted to reach pt, no answer, LMOM. Bethena Midget, RN, BSN  June 14, 2009 2:10 PM  D/w pt on 06/14/2009 at 1555...she states this test was done on Friday and she didn't receive instruction until today after  she had taken 0.5 tab.  She will hold tomorrow's dose until we see her.  Pt verbalizes understanding.

## 2010-07-13 NOTE — Medication Information (Signed)
Summary: rov/tm  Anticoagulant Therapy  Managed by: Cloyde Reams, RN, BSN PCP: Kirby Funk, MD Supervising MD: Johney Frame MD, Fayrene Fearing Indication 1: Atrial Fibrillation (ICD-427.31) Lab Used: [facility] [place] Site: [address] INR POC 2.0 INR RANGE 2 - 3  Dietary changes: no    Health status changes: yes       Details: C/O persistant arthritis pain.  Bleeding/hemorrhagic complications: no    Recent/future hospitalizations: no    Any changes in medication regimen? yes       Details: Decr use of Celebrex.    Recent/future dental: no  Any missed doses?: no       Is patient compliant with meds? yes       Allergies (verified): 1)  ! Lisinopril 2)  ! Penicillin  Anticoagulation Management History:      The patient is taking warfarin and comes in today for a routine follow up visit.  Positive risk factors for bleeding include an age of 22 years or older.  The bleeding index is 'intermediate risk'.  Positive CHADS2 values include Age > 42 years old.  The start date was 04/09/2008.  Her last INR was 0.9 RATIO.  Anticoagulation responsible provider: Basilio Meadow MD, Fayrene Fearing.  INR POC: 2.0.  Cuvette Lot#: [id number].  Exp: 09/2010.    Anticoagulation Management Assessment/Plan:      The patient's current anticoagulation dose is Warfarin sodium 5 mg tabs: Use as directed by Anticoagulation Clinic, Warfarin sodium 2.5 mg tabs: Take as directed by coumadin clinic..  The target INR is 2 - 3.  The next INR is due 08/05/2009.  Anticoagulation instructions were given to patient.  Results were reviewed/authorized by Cloyde Reams, RN, BSN.  She was notified by Cloyde Reams RN.         Prior Anticoagulation Instructions: INR 3.4  Skip today then decrease dose to 5mg  tablet daily except 2.5mg  on Tuesdays, Thursdays, and Saturdays. Recheck in 2 weeks.  Current Anticoagulation Instructions: INR 2.0  Take 5mg  tomorrow then resume same dosage 5mg  daily except 2.5mg  on Tuesdays, Thursdays  and Saturdays.  Recheck in 3 weeks.

## 2010-07-13 NOTE — Medication Information (Signed)
Summary: Pradaxa start/holding coumadin since 08/05/09/ewj  Anticoagulant Therapy  Managed by: Inactive PCP: Kirby Funk, MD Supervising MD: Tenny Craw MD, Gunnar Fusi Indication 1: Atrial Fibrillation (ICD-427.31) Lab Used: [facility] [place] Site: [address] INR POC 1.2 INR RANGE 2 - 3  Dietary changes: no    Health status changes: no    Bleeding/hemorrhagic complications: no    Recent/future hospitalizations: no    Any changes in medication regimen? no    Recent/future dental: no  Any missed doses?: yes     Details: Holding coumadin since 09/02/09.    Is patient compliant with meds? yes      Comments: Pending Pradaxa start, OK to start INR less than 2.0.  Will need CBC in 10 days after Pradaxa start.    Allergies: 1)  ! Lisinopril 2)  ! Penicillin  Anticoagulation Management History:      The patient is taking warfarin and comes in today for a routine follow up visit.  Positive risk factors for bleeding include an age of 75 years or older.  The bleeding index is 'intermediate risk'.  Positive CHADS2 values include Age > 75 years old.  The start date was 04/09/2008.  Her last INR was 0.9 RATIO.  Anticoagulation responsible provider: Tenny Craw MD, Gunnar Fusi.  INR POC: 1.2.  Cuvette Lot#: [id number].  Exp: 10/2010.    Anticoagulation Management Assessment/Plan:      The patient's current anticoagulation dose is Warfarin sodium 5 mg tabs: Use as directed by Anticoagulation Clinic.  The target INR is 2 - 3.  The next INR is due 09/02/2009.  Anticoagulation instructions were given to patient.  Results were reviewed/authorized by Inactive.  She was notified by Cloyde Reams RN.         Prior Anticoagulation Instructions: INR 2.5  Continue on same dosage 5mg  daily except 2.5mg  on Tuesdays, Thursdays, and Saturdays.  Recheck in 4 weeks.    Current Anticoagulation Instructions: INR 1.2  OK to start Pradaxa twice daily.  Will need CBC in 10 days after Pradax start.  Will send flag  to Dr Myrtis Ser pt is starting.

## 2010-07-13 NOTE — Assessment & Plan Note (Signed)
Summary: pc2   Visit Type:  Follow-up Primary Provider:  Kirby Funk, MD   History of Present Illness: The patient presents today for routine electrophysiology followup. She reports doing very well since last being seen in our clinic.  She feels that her afib has significantly improved.  The patient denies symptoms of palpitations, chest pain, shortness of breath, orthopnea, PND, lower extremity edema, dizziness, presyncope, syncope, or neurologic sequela. The patient is tolerating medications without difficulties and is otherwise without complaint today.  She reports occasional fatigue.  Current Medications (verified): 1)  Levoxyl 150 Mcg Tabs (Levothyroxine Sodium) .... Take 1 Tablet By Mouth Daily 2)  Metoprolol Succinate 25 Mg Xr24h-Tab (Metoprolol Succinate) .... Take One Tablet By Mouth Daily 3)  Rythmol Sr 325 Mg Xr12h-Cap (Propafenone Hcl) .... Take 1 Tablet By Mouth Two Times A Day 4)  Warfarin Sodium 5 Mg Tabs (Warfarin Sodium) .... Use As Directed By Anticoagulation Clinic 5)  Calcium Carbonate-Vitamin D 600-400 Mg-Unit  Tabs (Calcium Carbonate-Vitamin D) .... Take 1 Tablet By Mouth Daily 6)  Fish Oil   Oil (Fish Oil) .... Take 1200mg  Capsule By Mouth Once Daily 7)  Ocuvite  Tabs (Multiple Vitamins-Minerals) .... Take 1 Tablet By Mouth Daily 8)  Niacin 500 Mg Tabs (Niacin) .... Take 1 Tablet By Mouth Daily 9)  Hydrochlorothiazide 25 Mg Tabs (Hydrochlorothiazide) .... Take One Tablet By Mouth Daily. 10)  Warfarin Sodium 2.5 Mg Tabs (Warfarin Sodium) .... Take As Directed By Coumadin Clinic. 11)  Celebrex 200 Mg Caps (Celecoxib) .... As Needed  Allergies: 1)  ! Lisinopril 2)  ! Penicillin  Past History:  Past Medical History: Reviewed history from 02/07/2009 and no changes required. Hysterectomy * RHYTHMOL RX COUMADIN THERAPY (ICD-V58.61) BRADYCARDIA-TACHYCARDIA SYNDROME (ICD-427.81) PACEMAKER, PERMANENT (ICD-V45.01) HYPOTHYROIDISM (ICD-244.9) ATRIAL FIBRILLATION  (ICD-427.31)  Social History: Tobacco Use - Yes.  denies recent tobacco Alcohol Use - no Drug Use - no  Review of Systems       .  Vital Signs:  Patient profile:   77 year old female Height:      64 inches Weight:      198 pounds BMI:     34.11 Pulse rate:   58 / minute BP sitting:   132 / 62  (left arm)  Vitals Entered By: Laurance Flatten CMA (July 15, 2009 10:48 AM)  Physical Exam  General:  obese Head:  normocephalic and atraumatic Eyes:  PERRLA/EOM intact; conjunctiva and lids normal. Mouth:  Teeth, gums and palate normal. Oral mucosa normal. Neck:  Neck supple, no JVD. No masses, thyromegaly or abnormal cervical nodes. Chest Wall:  her pacemaker site is nicely healed. Lungs:  Clear bilaterally to auscultation and percussion. Heart:  Non-displaced PMI, chest non-tender; regular rate and rhythm, S1, S2 without murmurs, rubs or gallops. Carotid upstroke normal, no bruit. Normal abdominal aortic size, no bruits. Femorals normal pulses, no bruits. Pedals normal pulses. No edema, no varicosities. Abdomen:  Bowel sounds positive; abdomen soft and non-tender without masses, organomegaly, or hernias noted. No hepatosplenomegaly. Msk:  Back normal, normal gait. Muscle strength and tone normal. Pulses:  pulses normal in all 4 extremities Extremities:  No clubbing or cyanosis. Neurologic:  Alert and oriented x 3. Skin:  Intact without lesions or rashes. Cervical Nodes:  no significant adenopathy Psych:  Normal affect.   PPM Specifications Following MD:  Belva Crome     PPM Vendor:  [employer]     PPM Model Number:  [id number]     PPM Serial  Number:  [id number] DOI:  04/29/2008     PPM Implanting MD:  [name], MD  Lead 1    Location: [facility]     DOI: 04/19/2008     Model #: S45     Serial #: [id number]     Status: active Lead 2    Location: [facility]     DOI: 04/19/2008     Model #: ST60     Serial #: [id number]     Status: active   Indications:  Tachy brady syndrome   PPM Follow  Up Remote Check?  No Battery Est. Longevity:  6.4 years       PPM Device Measurements Atrium  Amplitude: 2.6 mV, Impedance: 593 ohms, Threshold: 0.8 V at 0.4 msec Right Ventricle  Amplitude: 31 mV, Impedance: 982 ohms, Threshold: 0.7 V at 0.4 msec  Episodes MS Episodes:  85     Percent Mode Switch:  1%     Coumadin:  Yes Atrial Pacing:  70%     Ventricular Pacing:  11%  Parameters Mode:  DDD/CLS     Lower Rate Limit:  60     Upper Rate Limit:  120 Paced AV Delay:  250     Next Cardiology Appt Due:  01/09/2010 Tech Comments:  CLS programmed on.  Checked by [employer].  ROV 6 months. Altha Harm, LPN  July 15, 2009 11:09 AM  MD Comments:  CLS programmed on for better rate reponse. Pt has had some afib, longest 3 hours 57 minutes  Impression & Recommendations:  Problem # 1:  ATRIAL FIBRILLATION (ICD-427.31) stable pt is currently happy with her afib burden. rates are controlled continue coumadin  Her updated medication list for this problem includes:    Metoprolol Succinate 25 Mg Xr24h-tab (Metoprolol succinate) .Marland Kitchen... Take one tablet by mouth daily    Rythmol Sr 325 Mg Xr12h-cap (Propafenone hcl) .Marland Kitchen... Take 1 tablet by mouth two times a day    Warfarin Sodium 5 Mg Tabs (Warfarin sodium) ..... Use as directed by anticoagulation clinic    Warfarin Sodium 2.5 Mg Tabs (Warfarin sodium) .Marland Kitchen... Take as directed by coumadin clinic.  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  Her updated medication list for this problem includes:    Metoprolol Succinate 25 Mg Xr24h-tab (Metoprolol succinate) .Marland Kitchen... Take one tablet by mouth daily    Rythmol Sr 325 Mg Xr12h-cap (Propafenone hcl) .Marland Kitchen... Take 1 tablet by mouth two times a day    Warfarin Sodium 5 Mg Tabs (Warfarin sodium) ..... Use as directed by anticoagulation clinic    Warfarin Sodium 2.5 Mg Tabs (Warfarin sodium) .Marland Kitchen... Take as directed by coumadin clinic.  Patient  Instructions: 1)  Your physician recommends that you schedule a follow-up appointment in: 12 months with Dr Johney Frame

## 2010-07-13 NOTE — Medication Information (Signed)
Summary: rov/ewj  Anticoagulant Therapy  Managed by: Shelby Dubin, PharmD PCP: Kirby Funk, MD Supervising MD: Juanda Chance MD, Perla Echavarria Indication 1: Atrial Fibrillation (ICD-427.31) Lab Used: [facility] [place] Site: [address] INR POC 3.4 INR RANGE 2 - 3  Dietary changes: no    Health status changes: no    Bleeding/hemorrhagic complications: no    Recent/future hospitalizations: no    Any changes in medication regimen? yes       Details: Started Celebrex 200mg  2 weeks ago for arthritis.  Recent/future dental: no  Any missed doses?: no       Is patient compliant with meds? yes       Current Medications (verified): 1)  Levoxyl 150 Mcg Tabs (Levothyroxine Sodium) .... Take 1 Tablet By Mouth Daily 2)  Metoprolol Succinate 25 Mg Xr24h-Tab (Metoprolol Succinate) .... Take One Tablet By Mouth Daily 3)  Rythmol Sr 325 Mg Xr12h-Cap (Propafenone Hcl) .... Take 1 Tablet By Mouth Two Times A Day 4)  Warfarin Sodium 5 Mg Tabs (Warfarin Sodium) .... Use As Directed By Anticoagulation Clinic 5)  Calcium Carbonate-Vitamin D 600-400 Mg-Unit  Tabs (Calcium Carbonate-Vitamin D) .... Take 1 Tablet By Mouth Daily 6)  Fish Oil   Oil (Fish Oil) .... Take 1200mg  Capsule By Mouth Two Times A Day 7)  Ocuvite  Tabs (Multiple Vitamins-Minerals) .... Take 1 Tablet By Mouth Daily 8)  Niacin 500 Mg Tabs (Niacin) .... Take 1 Tablet By Mouth Daily 9)  Hydrochlorothiazide 25 Mg Tabs (Hydrochlorothiazide) .... Take One Tablet By Mouth Daily. 10)  Warfarin Sodium 2.5 Mg Tabs (Warfarin Sodium) .... Take As Directed By Coumadin Clinic. 11)  Celebrex 200 Mg Caps (Celecoxib) .... Take 1 Tablet Daily For Back Pain.  Allergies: 1)  ! Lisinopril 2)  ! Penicillin  Anticoagulation Management History:      The patient is taking warfarin and comes in today for a routine follow up visit.  Positive risk factors for bleeding include an age of 95 years or older.  The bleeding index is 'intermediate risk'.   Positive CHADS2 values include Age > 27 years old.  The start date was 04/09/2008.  Her last INR was 0.9 RATIO.  Anticoagulation responsible provider: Juanda Chance MD, Smitty Cords.  INR POC: 3.4.  Cuvette Lot#: [id number].  Exp: 07/2010.    Anticoagulation Management Assessment/Plan:      The patient's current anticoagulation dose is Warfarin sodium 5 mg tabs: Use as directed by Anticoagulation Clinic, Warfarin sodium 2.5 mg tabs: Take as directed by coumadin clinic..  The target INR is 2 - 3.  The next INR is due 07/15/2009.  Anticoagulation instructions were given to patient.  Results were reviewed/authorized by Shelby Dubin, PharmD.  She was notified by Lew Dawes, PharmD Candidate.         Prior Anticoagulation Instructions: INR 3.2  Pt has held coumadin today per Shelby Dubin, PharmD yesterday.  Resume same dosage 5mg  daily except 2.5mg  on Tuesdays and Saturdays.  Recheck in 3 weeks.    Current Anticoagulation Instructions: INR 3.4  Skip today then decrease dose to 5mg  tablet daily except 2.5mg  on Tuesdays, Thursdays, and Saturdays. Recheck in 2 weeks.

## 2010-07-13 NOTE — Procedures (Signed)
Summary: Cardiology Device Clinic   Allergies: 1)  ! Lisinopril 2)  ! Penicillin  PPM Specifications Following MD:  Belva Crome     PPM Vendor:  [employer]     PPM Model Number:  [id number]     PPM Serial Number:  [id number] DOI:  04/29/2008     PPM Implanting MD:  [name], MD  Lead 1    Location: [facility]     DOI: 04/19/2008     Model #: S45     Serial #: [id number]     Status: active Lead 2    Location: [facility]     DOI: 04/19/2008     Model #: ST60     Serial #: [id number]     Status: active   Indications:  Tachy brady syndrome   PPM Follow Up Remote Check?  No Battery Voltage:  2.77 V     Battery Est. Longevity:  5.6years     Pacer Dependent:  No       PPM Device Measurements Atrium  Amplitude: 1.6 mV, Threshold: 0.7 V at 0.4 msec Right Ventricle  Amplitude: 28.7 mV, Threshold: 0.8 V at 0.4 msec  Episodes Percent Mode Switch:  7%     Coumadin:  Yes Atrial Pacing:  80%     Ventricular Pacing:  25%  Parameters Mode:  DDD/CLS     Lower Rate Limit:  60     Upper Rate Limit:  120 Paced AV Delay:  250     Tech Comments:  A-fib with RVR 7%.  Checked by industry during scheduled appt. with Dr. Myrtis Ser.   Altha Harm, LPN  February 10, 2010 2:58 PM

## 2010-07-13 NOTE — Assessment & Plan Note (Signed)
Summary: [name]   Visit Type:  Follow-up Primary Provider:  Kirby Funk, MD  CC:  atrial fibrillation.  History of Present Illness: The patient is seen for followup of atrial fibrillation.  She has tachybradycardia syndrome.  She is quite stable on Rythmol with her pacemaker.  Her pacemaker parameters were changed and she feels better.  We know that she had a cough that was made worse with an ACE inhibitor.  She tells me that there is still some cough but it is much better.  It appears that she has both a chronic cough and an additional reaction to an ACE inhibitor.  Current Medications (verified): 1)  Levoxyl 150 Mcg Tabs (Levothyroxine Sodium) .... Take 1 Tablet By Mouth Daily 2)  Metoprolol Succinate 25 Mg Xr24h-Tab (Metoprolol Succinate) .... Take One Tablet By Mouth Daily 3)  Rythmol Sr 225 Mg Xr12h-Cap (Propafenone Hcl) .Marland Kitchen.. 1 Tab Two Times A Day 4)  Warfarin Sodium 5 Mg Tabs (Warfarin Sodium) .... Use As Directed By Anticoagulation Clinic 5)  Calcium Carbonate-Vitamin D 600-400 Mg-Unit  Tabs (Calcium Carbonate-Vitamin D) .... Take 1 Tablet By Mouth Daily 6)  Fish Oil   Oil (Fish Oil) .... 1000mg  1 Tab Two Times A Day 7)  Ocuvite  Tabs (Multiple Vitamins-Minerals) .... Take 1 Tablet By Mouth Daily 8)  Niacin 500 Mg Tabs (Niacin) .... Take 1 Tablet By Mouth Daily 9)  Hydrochlorothiazide 25 Mg Tabs (Hydrochlorothiazide) .... Take One Tablet By Mouth Daily. 10)  Celebrex 200 Mg Caps (Celecoxib) .... As Needed 11)  Vitamin D 400iu .Marland Kitchen.. 1 Tab Once Daily  Allergies (verified): 1)  ! Lisinopril 2)  ! Penicillin  Past History:  Past Medical History: Hysterectomy * RHYTHMOL RX COUMADIN THERAPY (ICD-V58.61)  //  switch to pradaxa  March, 2011... 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 chronic component and ACE inhibitor component Hair loss... patient feels may  be Coumadin related  Review of Systems       Patient denies fever, chills, headache, sweats, rash, change in vision, change in hearing.  She is having some hair loss.  She has no nausea or vomiting.  All other systems are reviewed and are negative.  Vital Signs:  Patient profile:   77 year old female Height:      64 inches Weight:      199 pounds BMI:     34.28 Pulse rate:   68 / minute BP sitting:   123 / 70  (left arm) Cuff size:   large  Vitals Entered By: Burnett Kanaris, CNA (August 16, 2009 12:05 PM)  Physical Exam  General:  patient is stable. Head:  head is atraumatic. Eyes:  no xanthelasma. Neck:  no jugular venous attention. Lungs:  eyes are clear.  Respiratory effort is nonlabored. Extremities:  no peripheral edema. Psych:  patient is oriented to person time and place.  Affect is normal.   PPM Specifications Following MD:  Belva Crome     PPM Vendor:  Biotronik     PPM Model Number:  [id number]     PPM Serial Number:  [id number] PPM DOI:  04/29/2008     PPM Implanting MD:  [name], MD  Lead 1    Location: [facility]     DOI: 04/19/2008     Model #: [id number]     Serial #: [id number]     Status: active Lead 2    Location: RV     DOI: 04/19/2008  Model #: [id number]     Serial #: [id number]     Status: active   Indications:  Tachy brady syndrome   Episodes Coumadin:  Yes  Parameters Mode:  DDD/CLS     Lower Rate Limit:  60     Upper Rate Limit:  120 Paced AV Delay:  250     Impression & Recommendations:  Problem # 1:  * HAIR LOSS Patient feels this is related to Coumadin.  Problem # 2:  COUGH (ICD-786.2)  The following medications were removed from the medication list:    Warfarin Sodium 2.5 Mg Tabs (Warfarin sodium) .Marland Kitchen... Take as directed by coumadin clinic. Her updated medication list for this problem includes:    Metoprolol Succinate 25 Mg Xr24h-tab (Metoprolol succinate) .Marland Kitchen... Take one tablet by mouth daily    Rythmol Sr 225 Mg Xr12h-cap (Propafenone hcl) .Marland Kitchen... 1 tab two  times a day    Warfarin Sodium 5 Mg Tabs (Warfarin sodium) ..... Use as directed by anticoagulation clinic We will keep the patient off ACE inhibitors.  There is an additional chronic component to her cough also.  Problem # 3:  COUMADIN THERAPY (ICD-V58.61) The patient will be changed to pradaxa at her choice.  Problem # 4:  PACEMAKER, PERMANENT (ICD-V45.01) The patient feels even better since her pacer was adjusted.  Patient Instructions: 1)  We have given you a prescription for Pradaxa to send to the [place] when you get the medicine call the coumadin clinic and let them know, you will need to be off coumadin for 3 days and then see them for ok to start Pradaxa. 2)  Follow up in 6 months Prescriptions: PRADAXA 150 MG CAPS (DABIGATRAN ETEXILATE MESYLATE) Take 1 tablet by mouth two times a day  #180 x 3   Entered by:   Meredith Staggers, RN   Authorized by:   Talitha Givens, MD, [facility]   Signed by:   Meredith Staggers, RN on 08/16/2009   Method used:   Print then Give to Patient   RxID:   [id number]

## 2010-07-13 NOTE — Progress Notes (Signed)
Summary: refill request/2nd call refill  Phone Note Refill Request Message from:  Patient on April 24, 2010 8:53 AM  Refills Requested: Medication #1:  CARDIZEM CD 240 MG XR24H-CAP Take 1 tablet by mouth once a day pt wants written rx- wants to pick up-wants 90 days with three refills-pt will call to see if ready while in the area today   Method Requested: Pick up at Office Initial call taken by: Glynda Jaeger,  April 24, 2010 8:56 AM  Follow-up for Phone Call        pt would like to know when she can pick up prescription [phone] or cell# [phone] Follow-up by: Roe Coombs,  April 24, 2010 12:53 PM  Additional Follow-up for Phone Call Additional follow up Details #1::        called Pt, let her know that Dr Myrtis Ser is not in the office today but I can get this done for her 1st thing in the morning on Wed, so she can pick it up then and it would be waiting at the front desk. Hardin Negus, RMA  April 24, 2010 1:36 PM     Prescriptions: CARDIZEM CD 240 MG XR24H-CAP (DILTIAZEM HCL COATED BEADS) Take 1 tablet by mouth once a day  #90 x 3   Entered by:   Hardin Negus, RMA   Authorized by:   Talitha Givens, MD, [facility]   Signed by:   Hardin Negus, RMA on 04/25/2010   Method used:   Print then Give to Patient   RxID:   [id number]   Appended Document: refill request/2nd call refill Annice Pih returned a prescription that was left at the front so I called and spoke with Ms Morford, it was Clelia Schaumann be hard to get it in time sending it to the [place] so she got a script local and is coming in to see Dr Myrtis Ser on 1/5 and will get a script then if she is to continue on med.

## 2010-07-13 NOTE — Progress Notes (Signed)
Summary: question re med orthopedic prescribed  Phone Note Call from Patient   Caller: Patient Reason for Call: Talk to Nurse Summary of Call: pt went to orthopedic dr for knee and back -was told to check with [place] to see if she can take the voltaren she was prescribed-if so she wants to fill it asap-pls call [phone] Initial call taken by: Glynda Jaeger,  January 03, 2010 9:34 AM  Follow-up for Phone Call        spoke w/pt Dr Penni Bombard gave her prescription for Voltareen gel and wanted to call and make sure it was ok w/Dr Myrtis Ser before she started it, advised Dr Myrtis Ser on vacation until 8/11, will send to him for review Dennis Bast, RN  Additional Follow-up for Phone Call Additional follow up Details #1::        Agree.   OK to take med.  Talitha Givens, MD, [facility]  January 09, 2010 9:56 AM  pt is aware Meredith Staggers, RN  January 09, 2010 3:44 PM

## 2010-07-13 NOTE — Medication Information (Signed)
Summary: rov/ewj  Anticoagulant Therapy  Managed by: Cloyde Reams, RN, BSN PCP: Kirby Funk, MD Supervising MD: Jens Som MD, Arlys John Indication 1: Atrial Fibrillation (ICD-427.31) Lab Used: [facility] [place] Site: [address] INR POC 2.5 INR RANGE 2 - 3  Dietary changes: no    Health status changes: no    Bleeding/hemorrhagic complications: no    Recent/future hospitalizations: no    Any changes in medication regimen? no    Recent/future dental: no  Any missed doses?: no       Is patient compliant with meds? yes       Allergies (verified): 1)  ! Lisinopril 2)  ! Penicillin  Anticoagulation Management History:      The patient is taking warfarin and comes in today for a routine follow up visit.  Positive risk factors for bleeding include an age of 75 years or older.  The bleeding index is 'intermediate risk'.  Positive CHADS2 values include Age > 63 years old.  The start date was 04/09/2008.  Her last INR was 0.9 RATIO.  Anticoagulation responsible provider: Jens Som MD, Arlys John.  INR POC: 2.5.  Cuvette Lot#: [id number].  Exp: 10/2010.    Anticoagulation Management Assessment/Plan:      The patient's current anticoagulation dose is Warfarin sodium 5 mg tabs: Use as directed by Anticoagulation Clinic, Warfarin sodium 2.5 mg tabs: Take as directed by coumadin clinic..  The target INR is 2 - 3.  The next INR is due 09/02/2009.  Anticoagulation instructions were given to patient.  Results were reviewed/authorized by Cloyde Reams, RN, BSN.  She was notified by Cloyde Reams RN.         Prior Anticoagulation Instructions: INR 2.0  Take 5mg  tomorrow then resume same dosage 5mg  daily except 2.5mg  on Tuesdays, Thursdays and Saturdays.  Recheck in 3 weeks.    Current Anticoagulation Instructions: INR 2.5  Continue on same dosage 5mg  daily except 2.5mg  on Tuesdays, Thursdays, and Saturdays.  Recheck in 4 weeks.

## 2010-07-20 ENCOUNTER — Encounter: Payer: Self-pay | Admitting: Internal Medicine

## 2010-07-20 ENCOUNTER — Encounter (INDEPENDENT_AMBULATORY_CARE_PROVIDER_SITE_OTHER): Payer: Medicare Other | Admitting: Internal Medicine

## 2010-07-20 ENCOUNTER — Other Ambulatory Visit: Payer: Self-pay | Admitting: Internal Medicine

## 2010-07-20 DIAGNOSIS — I10 Essential (primary) hypertension: Secondary | ICD-10-CM

## 2010-07-20 DIAGNOSIS — I495 Sick sinus syndrome: Secondary | ICD-10-CM

## 2010-07-20 DIAGNOSIS — I4891 Unspecified atrial fibrillation: Secondary | ICD-10-CM

## 2010-07-21 LAB — BASIC METABOLIC PANEL
BUN: 20 mg/dL (ref 6–23)
CO2: 33 mEq/L — ABNORMAL HIGH (ref 19–32)
Calcium: 9.6 mg/dL (ref 8.4–10.5)
Chloride: 99 mEq/L (ref 96–112)
Creatinine, Ser: 1 mg/dL (ref 0.4–1.2)
GFR: 54.67 mL/min — ABNORMAL LOW (ref >60.00)
Glucose, Bld: 87 mg/dL (ref 70–99)
Potassium: 3.6 mEq/L (ref 3.5–5.1)
Sodium: 139 mEq/L (ref 135–145)

## 2010-07-21 LAB — CBC WITH DIFFERENTIAL/PLATELET
Basophils Absolute: 0 10*3/uL (ref 0.0–0.1)
Basophils Relative: 0.7 % (ref 0.0–3.0)
Eosinophils Absolute: 0.5 10*3/uL (ref 0.0–0.7)
Eosinophils Relative: 7.2 % — ABNORMAL HIGH (ref 0.0–5.0)
HCT: 41.9 % (ref 36.0–46.0)
Hemoglobin: 14.1 g/dL (ref 12.0–15.0)
Lymphocytes Relative: 28.5 % (ref 12.0–46.0)
Lymphs Abs: 2 10*3/uL (ref 0.7–4.0)
MCHC: 33.7 g/dL (ref 30.0–36.0)
MCV: 96.6 fl (ref 78.0–100.0)
Monocytes Absolute: 1 10*3/uL (ref 0.1–1.0)
Monocytes Relative: 14.9 % — ABNORMAL HIGH (ref 3.0–12.0)
Neutro Abs: 3.4 10*3/uL (ref 1.4–7.7)
Neutrophils Relative %: 48.7 % (ref 43.0–77.0)
Platelets: 223 10*3/uL (ref 150.0–400.0)
RBC: 4.34 Mil/uL (ref 3.87–5.11)
RDW: 13.5 % (ref 11.5–14.6)
WBC: 6.9 10*3/uL (ref 4.5–10.5)

## 2010-07-27 NOTE — Assessment & Plan Note (Signed)
Summary: f1y/per pt call/mj/kwb   Visit Type:  Follow-up Primary Provider:  Kirby Funk, MD   History of Present Illness: The patient presents today for routine electrophysiology followup. She reports doing very well since last being seen in our clinic.  Her afib burden has improved.  Her palpitations have significantly improved with cardizem.  She remains very active for her age.  She denies chest pain, shortness of breath, orthopnea, PND, lower extremity edema, dizziness, presyncope, syncope, or neurologic sequela. The patient is tolerating medications without difficulties and is otherwise without complaint today.   Current Medications (verified): 1)  Levoxyl 150 Mcg Tabs (Levothyroxine Sodium) .... Take 1 Tablet By Mouth Daily 2)  Metoprolol Succinate 25 Mg Xr24h-Tab (Metoprolol Succinate) .... Take One Tablet By Mouth Daily 3)  Rythmol Sr 225 Mg Xr12h-Cap (Propafenone Hcl) .Marland Kitchen.. 1 Tab Two Times A Day 4)  Calcium Carbonate-Vitamin D 600-400 Mg-Unit  Tabs (Calcium Carbonate-Vitamin D) .... Take 1 Tablet By Mouth Daily 5)  Fish Oil   Oil (Fish Oil) .... 1000mg  1 Tab Once Daily 6)  Ocuvite  Tabs (Multiple Vitamins-Minerals) .... Take 1 Tablet By Mouth Daily 7)  Niacin 500 Mg Tabs (Niacin) .... Take 1 Tablet By Mouth Daily 8)  Vitamin D 400 Unit  Tabs (Cholecalciferol) .... Once Daily 9)  Pradaxa 150 Mg Caps (Dabigatran Etexilate Mesylate) .... Take 1 Tablet By Mouth Two Times A Day 10)  Voltaren 1 % Gel (Diclofenac Sodium) .... Two Times A Day 11)  Cardizem Cd 240 Mg Xr24h-Cap (Diltiazem Hcl Coated Beads) .... Take 1 Tablet By Mouth Once A Day 12)  Hydrochlorothiazide 25 Mg Tabs (Hydrochlorothiazide) .... Take One Tablet By Mouth Daily. 13)  Pravastatin Sodium 20 Mg Tabs (Pravastatin Sodium) .... Take One Tablet By Mouth Daily At Bedtime 14)  Vitamin D 400 Unit Caps (Cholecalciferol) .... Uad  Allergies: 1)  ! Lisinopril 2)  ! Penicillin  Past History:  Past Medical  History: Hysterectomy * RHYTHMOL RX COUMADIN THERAPY (ICD-V58.61)  //  switch to pradaxa  March, 2011... patient request BRADYCARDIA-TACHYCARDIA SYNDROME (ICD-427.81) s/p PPM by JA HYPOTHYROIDISM (ICD-244.9) PAROXYSMAL ATRIAL FIBRILLATION (ICD-427.31)  ..episodes of rapid atrial fib with pacemaker interrogation.. August, 2011..diltiazem added.... patient improved Cough... chronic component and ACE inhibitor component Hair loss... patient feels may be Coumadin related EF  70%... echo.... October, 2009.... vigorous LV function Nuclear   November, 2009...Marland KitchenMarland Kitchen no ischemia.... shifting breast attenuation...  Past Surgical History: s/p Biotronic PPM by JA 04/19/08  Social History: Reviewed history from 07/15/2009 and no changes required. Tobacco Use - Yes.  denies recent tobacco Alcohol Use - no Drug Use - no  Review of Systems       All systems are reviewed and negative except as listed in the HPI.   Vital Signs:  Patient profile:   77 year old female Weight:      1933 pounds Pulse rate:   68 / minute BP sitting:   140 / 70  (left arm)  Vitals Entered By: Laurance Flatten CMA (July 20, 2010 4:05 PM)  Physical Exam  General:  NAD, appears very well for her age Head:  head is atraumatic. Eyes:  PERRLA/EOM intact; conjunctiva and lids normal. Mouth:  Teeth, gums and palate normal. Oral mucosa normal. Neck:  supple Chest Wall:  pacemaker pocket is well healed Lungs:  Clear bilaterally to auscultation and percussion. Heart:  RRR, no m/r/g Abdomen:  Bowel sounds positive; abdomen soft and non-tender without masses, organomegaly, or hernias noted. No hepatosplenomegaly.  Msk:  Back normal, normal gait. Muscle strength and tone normal. Extremities:  No clubbing or cyanosis. Neurologic:  Alert and oriented x 3.   PPM Specifications Following MD:  Belva Crome     PPM Vendor:  Biotronik     PPM Model Number:  [id number]     PPM Serial Number:  [id number] PPM DOI:  04/29/2008     PPM  Implanting MD:  [name], MD  Lead 1    Location: RA     DOI: 04/19/2008     Model #: S45     Serial #: [id number]     Status: active Lead 2    Location: RV     DOI: 04/19/2008     Model #: [id number]     Serial #: [id number]     Status: active   Indications:  Tachy brady syndrome   PPM Follow Up Battery Voltage:  2.76 V     Battery Est. Longevity:  5 yrs 4 mths     Pacer Dependent:  No       PPM Device Measurements Atrium  Amplitude: 3.8 mV, Impedance: 549 ohms, Threshold: 0.8 V at 0.4 msec Right Ventricle  Amplitude: 30.7 mV, Impedance: 869 ohms, Threshold: 0.8 V at 0.4 msec  Episodes MS Episodes:  2440     Percent Mode Switch:  3.0%     Coumadin:  Yes Ventricular High Rate:  0     Atrial Pacing:  83%     Ventricular Pacing:  53%  Parameters Mode:  DDD/CLS     Lower Rate Limit:  60     Upper Rate Limit:  120 Paced AV Delay:  300     Next Cardiology Appt Due:  01/10/2011 Tech Comments:  CHECKED BY INDUSTRY---2440 MODE SWITCHES--LONGEST WAS 4 HRS. AF W/RVR 174. NORMAL DEVICE FUNCTION. CHANGED PAV DELAY FROM 250 TO 300. ROV IN 6 MTHS W/DEVICE CLINIC. Vella Kohler  July 20, 2010 4:51 PM  MD Comments:  agree She V paces 53%.  AV delay adjusted as above to minimze ventricular pacing  Impression & Recommendations:  Problem # 1:  ATRIAL FIBRILLATION (ICD-427.31) maintaining sinus rhythm afib burden by PPM check is only 3% at this point V rates remain slightly elevated at times during afib.  We may need to increase cardizem further down the road.  As she is doing so well, no changes are made today.  We will check Creatinine clearance and CBC today as she is on pradaxa.  Problem # 2:  BRADYCARDIA-TACHYCARDIA SYNDROME (ICD-427.81) normal pacemaker function as above  Problem # 3:  ESSENTIAL HYPERTENSION, BENIGN (ICD-401.1) controlled no changes today  Other Orders: TLB-CBC Platelet - w/Differential (85025-CBCD) TLB-BMP (Basic Metabolic Panel-BMET) (80048-METABOL)  Patient  Instructions: 1)  Your physician recommends that you schedule a follow-up appointment in: 6 months with device clinic. 2)  Your physician recommends that you continue on your current medications as directed. Please refer to the Current Medication list given to you today.

## 2010-08-16 ENCOUNTER — Encounter (INDEPENDENT_AMBULATORY_CARE_PROVIDER_SITE_OTHER): Payer: Self-pay | Admitting: *Deleted

## 2010-08-22 NOTE — Letter (Signed)
Summary: Appointment - Reschedule  [facility], Main Office  [address]   [place], [place] [address]   Phone: [phone]  Fax: [phone]     August 16, 2010 MRN: [id number]   [facility] [address] [place], [place]  [address]   Dear Ms. SPRUNG,   Due to a change in our office schedule, your appointment on  Apirl 11,2012 at 9:30 must be changed.  It is very important that we reach you to reschedule this appointment. We look forward to participating in your health care needs. Please contact us at the number listed above at your earliest convenience to reschedule this appointment.     Sincerely, Pension scheme manager

## 2010-08-26 ENCOUNTER — Encounter: Payer: Self-pay | Admitting: Cardiology

## 2010-09-06 ENCOUNTER — Encounter: Payer: Self-pay | Admitting: Cardiology

## 2010-09-06 ENCOUNTER — Ambulatory Visit (INDEPENDENT_AMBULATORY_CARE_PROVIDER_SITE_OTHER)
Admission: RE | Admit: 2010-09-06 | Discharge: 2010-09-06 | Disposition: A | Discharge status: Home / Self Care | Payer: Medicare Other | Source: Ambulatory Visit | Attending: Cardiology | Admitting: Cardiology

## 2010-09-06 ENCOUNTER — Ambulatory Visit (INDEPENDENT_AMBULATORY_CARE_PROVIDER_SITE_OTHER): Payer: Medicare Other | Admitting: Cardiology

## 2010-09-06 VITALS — BP 120/66 | HR 75 | Ht 63.0 in | Wt 196.0 lb

## 2010-09-06 DIAGNOSIS — Z79899 Other long term (current) drug therapy: Secondary | ICD-10-CM | POA: Insufficient documentation

## 2010-09-06 DIAGNOSIS — E039 Hypothyroidism, unspecified: Secondary | ICD-10-CM | POA: Insufficient documentation

## 2010-09-06 DIAGNOSIS — R05 Cough: Secondary | ICD-10-CM

## 2010-09-06 DIAGNOSIS — R059 Cough, unspecified: Secondary | ICD-10-CM | POA: Insufficient documentation

## 2010-09-06 DIAGNOSIS — I495 Sick sinus syndrome: Secondary | ICD-10-CM | POA: Insufficient documentation

## 2010-09-06 DIAGNOSIS — Z950 Presence of cardiac pacemaker: Secondary | ICD-10-CM

## 2010-09-06 DIAGNOSIS — I4891 Unspecified atrial fibrillation: Secondary | ICD-10-CM

## 2010-09-06 DIAGNOSIS — I480 Paroxysmal atrial fibrillation: Secondary | ICD-10-CM | POA: Insufficient documentation

## 2010-09-06 NOTE — Assessment & Plan Note (Signed)
Pacemaker is working well.  No change in her settings are needed.  She was not interrogated today.  She has had pacemaker followup here in the office.

## 2010-09-06 NOTE — Assessment & Plan Note (Signed)
Atrial fibrillation is under good control.  I will not be changing any of her medicines at this time.

## 2010-09-06 NOTE — Patient Instructions (Signed)
Labwork today A chest x-Epler takes a picture of the organs and structures inside the chest, including the heart, lungs, and blood vessels. This test can show several things, including, whether the heart is enlarges; whether fluid is building up in the lungs; and whether pacemaker / defibrillator leads are still in place. Your physician recommends that you schedule a follow-up appointment in: 3 months.

## 2010-09-06 NOTE — Assessment & Plan Note (Signed)
The patient has had a cough in the past.  We felt that there may have been an ACE inhibitor component.  She is not on an ace.  She has a wet cough here today.  She'll need further followup with her primary physician.  Chest x-Heaps will be ordered

## 2010-09-06 NOTE — Progress Notes (Signed)
HPI Patient is seen for followup of atrial fibrillation.  She is not having a lot of palpitations.  She has been bothered by a cough.  This has been persistent.  In the past we felt that her cough is multifactorial.  There may have been an ACE component but she is no longer on an ACE.  She is not having chest pain.  The cough is productive intermittently. Allergies  Allergen Reactions  . Lisinopril     REACTION: Cough  . Penicillins     REACTION: Swelling, Rash    Current Outpatient Prescriptions  Medication Sig Dispense Refill  . Calcium Carbonate-Vit D-Min (CALCIUM 600+D PLUS MINERALS) 600-400 MG-UNIT TABS Take 1 tablet by mouth daily.        . Cholecalciferol (VITAMIN D) 400 UNITS capsule Take 400 Units by mouth daily.        . dabigatran (PRADAXA) 150 MG CAPS Take 150 mg by mouth every 12 (twelve) hours.        . diclofenac sodium (VOLTAREN) 1 % GEL Apply topically 2 (two) times daily.       Marland Kitchen diltiazem (CARDIZEM CD) 240 MG 24 hr capsule Take 240 mg by mouth daily.        . hydrochlorothiazide 25 MG tablet Take 25 mg by mouth daily.        Marland Kitchen levothyroxine (LEVOXYL) 150 MCG tablet Take 150 mcg by mouth daily.        . metoprolol succinate (TOPROL-XL) 25 MG 24 hr tablet Take 25 mg by mouth daily.        . Multiple Vitamins-Minerals (OCUVITE PO) 1 tab po qd       . niacin (NIASPAN) 500 MG CR tablet Take 500 mg by mouth at bedtime.        . Omega-3 Fatty Acids (FISH OIL EXTRA STRENGTH PO) 1 tab po qd       . pravastatin (PRAVACHOL) 20 MG tablet Take 20 mg by mouth daily.        . propafenone (RYTHMOL) 225 MG tablet Take 225 mg by mouth 2 (two) times daily.          History   Social History  . Marital Status: Widowed    Spouse Name: N/A    Number of Children: N/A  . Years of Education: N/A   Occupational History  . Not on file.   Social History Main Topics  . Smoking status: Former Smoker    Quit date: 09/06/1990  . Smokeless tobacco: Not on file  . Alcohol Use: No  . Drug  Use: No  . Sexually Active: Not on file   Other Topics Concern  . Not on file   Social History Narrative  . No narrative on file    Family History  Problem Relation Age of Onset  . Coronary artery disease      Past Medical History  Diagnosis Date  . Brady-tachy syndrome     Pacemaker   Dr.Allred  . Hypothyroidism   . Atrial fibrillation     Episodes rapid atrial fib noted by pacemaker interrogation, August, 2011, diltiazem added, patient improved  . History of hysterectomy   . Drug therapy     Rythmol, atrial fibrillation  . Drug therapy     Pradaxa  (Switch from Coumadin-patient request-March, 2011)  . Pacemaker     Brady tachycardia syndrome  . Cough     Chronic--an ACE inhibitor component  . Hair loss     Patient questioned  Coumadin, changed toPradaxa  . Atrial fibrillation     EF 70%, echo, October, 2009, vigorous LV function  /  nuclear November, 2009, no ischemia, shifting breast attenuation  . Cough     March, 2012    Past Surgical History  Procedure Date  . Pacemaker placement 2009    PPM-Biotronik    ROS  Patient denies fever, chills, headache, sweats, rash, change in vision, change in hearing, chest pain, nausea or vomiting, urinary symptoms.  She feels fatigued in general.  All other systems are reviewed and are negative. PHYSICAL EXAM Patient is overweight.   She is oriented to person time and place.  Affect is normal.  Head is atraumatic.  There is no xanthelasma.  There is no jugular venous distention.  Lungs reveal a few rhonchi.  There is no respiratory distress.  Cardiac exam reveals S1-S2.  There no clicks or significant murmurs.  Abdomen is soft.  There is no peripheral edema.  There are no musculoskeletal deformities.  There is no significant skin rashes. Filed Vitals:   09/06/10 1551  BP: 120/66  Pulse: 75  Height: 5\' 3"  (1.6 m)  Weight: 196 lb (88.905 kg)    EKG EKG is not done today.  ASSESSMENT & PLAN

## 2010-09-07 LAB — CBC WITH DIFFERENTIAL/PLATELET
Basophils Absolute: 0 10*3/uL (ref 0.0–0.1)
Eosinophils Absolute: 0.5 10*3/uL (ref 0.0–0.7)
Lymphocytes Relative: 27.5 % (ref 12.0–46.0)
Lymphs Abs: 1.8 10*3/uL (ref 0.7–4.0)
MCHC: 34 g/dL (ref 30.0–36.0)
Monocytes Relative: 12.7 % — ABNORMAL HIGH (ref 3.0–12.0)
Neutro Abs: 3.5 10*3/uL (ref 1.4–7.7)
Platelets: 208 10*3/uL (ref 150.0–400.0)
RDW: 12.8 % (ref 11.5–14.6)

## 2010-09-07 LAB — BASIC METABOLIC PANEL
BUN: 21 mg/dL (ref 6–23)
CO2: 31 mEq/L (ref 19–32)
Calcium: 9.6 mg/dL (ref 8.4–10.5)
GFR: 50.14 mL/min — ABNORMAL LOW (ref >60.00)
Glucose, Bld: 91 mg/dL (ref 70–99)

## 2010-09-20 ENCOUNTER — Ambulatory Visit: Payer: Medicare Other | Admitting: Cardiology

## 2010-10-24 NOTE — Assessment & Plan Note (Signed)
[facility]                            CARDIOLOGY OFFICE NOTE   [name], [name]                         MRN:          [id number]  DATE:08/16/2008                            DOB:          June 02, 1928    HISTORY OF PRESENT ILLNESS:  [name] is seen for cardiology follow-up.  From the cardiac viewpoint, she is stable.  She has had paroxysmal  atrial fibrillation and brady tachy syndrome.  She is on Coumadin and  she does have a pacemaker in place.  This was placed on April 19, 2008.  She is feeling and doing well.   Recently, she had an upper respiratory infection.  This was treated, but  she has recurrence, and she will be seeing her primary physician today.   ALLERGIES:  PENICILLIN.   MEDICATIONS:  Niacin, multivitamin, calcium, Levoxyl, Vesicare,  lisinopril, Ocuvite, Coumadin, Rythmol 225 b.i.d., fish oil and Toprol  25.   OTHER MEDICAL PROBLEMS:  See the list below.   REVIEW OF SYSTEMS:  She is not having any GI or GU symptoms.  She has no  rashes.  She does have some slight upper respiratory wheezing on end  expiration by her report at home.  Her major problem is her ongoing  upper respiratory problems.  Otherwise, review of systems is negative.   PHYSICAL EXAMINATION:  VITAL SIGNS:  Blood pressure 150/88 with a pulse  60.  The patient is oriented to person, time and place.  Affect normal.  HEENT:  Reveals no xanthelasma.  She has normal extraocular motion.  There are no carotid bruits.  There is no jugular venous distention.  LUNGS:  Clear.  Respiratory effort is not labored.  CARDIAC:  Exam reveals S1-S2.  There are no clicks or significant  murmur.  ABDOMEN:  Soft.  She has no peripheral edema.   PROBLEMS:  1. Status post hysterectomy.  2. Hypertension treated.  3. Paroxysmal atrial fibrillation that was symptomatic in the past.  4. Hypothyroidism treated.  5. Brady-tachy syndrome treated with a pacemaker.  6. Coumadin  therapy.  7. Current respiratory infection.   The patient has a penicillin allergy.  I am not sure of the full history  with this.  If she is to received a medication in the erythromycin  bracket, a Z-Pak would be the preferred route.  She is on Rythmol.  The  erythromycin is not absolutely contraindicated.  However, a third  generation  cephalosporin would probably be optimal unless it is felt that she has  high risk of cross reactivity from her penicillin allergy.  I will leave  this up to her primary physician.     [name] Abed, MD, [facility]  Electronically Signed    JDK/MedQ  DD: 08/16/2008  DT: 08/16/2008  Job #: [id number]   cc:   [name], M.D.

## 2010-10-24 NOTE — Assessment & Plan Note (Signed)
[facility]ALTHCARE                            CARDIOLOGY OFFICE NOTE   [name], [name]                         MRN:          [id number]  DATE:06/16/2007                            DOB:          July 13, 1927    Ms. Kren is doing well.  She describes one very limited episode of atrial  fibrillation recently.  She had taken cold medicine.  She is not having  this on a regular basis.  With this in mind, I am still comfortable with  her being treated with aspirin as she is. She is not having any chest  pain or shortness of breath.  She does walk regularly.   PAST MEDICAL HISTORY:  Allergies to PENICILLIN.   MEDICATIONS:  Multivitamin, calcium and vitamin D, Toprol XL 25, fish  oil, aspirin, Levoxyl, Vesicare, Crestor and lisinopril.   OTHER MEDICAL PROBLEMS:  See the list below.   REVIEW OF SYSTEMS:  She really doing well and she has no significant  complaints.  See the HPI.   PHYSICAL EXAM:  Blood pressure is 139/65 with a pulse of 58.  The  patient is oriented to person, time and place.  Affect is normal.  HEENT:  Reveals no xanthelasma.  She has normal extraocular motion.  There are no carotid bruits.  There is no jugular venous distention.  LUNGS:  Clear.  Respiratory effort is not labored.  CARDIAC:  Reveals an S1 with an S2.  There are no clicks or significant  murmurs.  ABDOMEN:  Soft.  She has no peripheral edema.   EKG reveals mild sinus bradycardia with old nonspecific ST-T wave  changes.   IMPRESSION:  1. Status post hysterectomy.  2. Hypertension.  Lisinopril has been added since last year and she is      nicely controlled.  3. Very rare episode of very limited atrial fib in the past.  We have      chosen not to have her on Coumadin and I am still comfortable with      this. She is on aspirin.  4. Hypothyroidism treated.  5. Good left ventricular function.  6. History of elevated triglycerides followed by Dr. Timothy Lasso.   Cardiac status is  stable.  No other workup at this time.  I will see her  back in 1 year.     Luis Abed, MD, [facility]  Electronically Signed    JDK/MedQ  DD: 06/16/2007  DT: 06/16/2007  Job #: [id number]   cc:   Gwen Pounds, MD

## 2010-10-24 NOTE — Assessment & Plan Note (Signed)
[facility]                            CARDIOLOGY OFFICE NOTE   COLISHA, REDLER                         MRN:          [id number]  DATE:03/22/2008                            DOB:          1927-12-16    Ms. Art is here for the followup of her atrial fibrillation with tachy-  brady syndrome and palpitations.  She was recently hospitalized.  This  occurred from March 05, 2008, to March 06, 2008.  She awoke with  palpitations.  She slept more and then called the office, and came to  the emergency room.  When she was at home she had felt poorly.  She  thought she might pass out going to the bathroom.  When she arrived to  the emergency room, she gave this history.  We are assuming she was  having bursts of atrial fib.  But I cannot absolutely document that we  saw atrial fib in the emergency room.  Her EKG in the emergency room  revealed sinus bradycardia with rates ranging from 40-50.  She  stabilized and had no MI and was allowed to go home.  She was discharged  on March 06, 2008.  TSH was documented in the normal range of 0.844.   Since being at home she may have had one additional short burst of  atrial fib, but otherwise has been stable.  She is here today with her  daughter for a very careful and complete discussion.  She mentioned that  the question of a pacemaker was mentioned in the hospital.  She all-day  also mentioned that there was question of her wearing an event recorder  which I am in agreement with.   PAST MEDICAL HISTORY:   ALLERGIES:  PENICILLIN.   MEDICATIONS:  1. Niacin.  2. Calcium.  3. Toprol XL 12.5 b.i.d.  4. Fish oil.  5. Aspirin.  6. Levoxyl.  7. Vesicare.  8. Lisinopril.   OTHER MEDICAL PROBLEMS:  See the list below.   REVIEW OF SYSTEMS:  She is not having any fevers or chills.  She has no  GI or GU symptoms.  Her review of systems is negative.   PHYSICAL EXAMINATION:  VITAL SIGNS:  Blood pressure is  140/66 with the  pulse of 54.  GENERAL:  The patient is oriented to person, time and place.  Affect is  normal.  As I mentioned, she is here with her daughter.  HEENT:  No xanthelasma.  She has normal extraocular motion.  NECK:  There are no carotid bruits.  There is no jugular venous  distention.  LUNGS:  Clear.  Respiratory effort is not labored.  CARDIAC:  S1 with an S2.  There are no clicks or significant murmurs.  ABDOMEN:  Soft.  EXTREMITIES:  She has no peripheral edema.   I have reviewed her exercise Cardiolite scan.  She walked four and a  half minutes.  I read the study.  I felt that she had some changes in  the anterior wall that were due to shifting breast attenuation.  She had  a  2-D echo that showed that she has vigorous LV function with an  ejection fraction of 75%, but no evidence of outflow tract obstruction.   PROBLEMS:  1. Status post hysterectomy.  2. History of hypertension under good control.  3. Episodes of atrial fibrillation in the past.  Most recently      palpitations that occurred during the night and she felt poorly.      When she arrived to the emergency room, her 12-lead revealed sinus      bradycardia.  We are assuming she had atrial fibrillation.  I am      not sure that this could be documented.  Her thyroid-stimulating      hormone was normal.  The patient clearly has bradycardia-      tachycardia syndrome.  I cannot push her medications any further.      We we will place an event recorder to try to get a better handle on      her overall swings in heart rate.  We will then adjust her      medicines.  The patient may well need a pacemaker.  4. Hypothyroidism, treated.  5. Question of Coumadin.  The patient is over 70 years of age.  She      therefore along with other potential risk factors followed by [place]      for the treatment of atrial fibrillation.  She qualifies for      Coumadin treatment.  I have made this clear to her, but have not       pushed forward today.  The patient seems hesitant.  The patient's      daughter was still trying to understand the situation.  We will      obtain an event recorder.  I will see her back in 2 weeks to make      sure how she is doing and then we will continue to discuss the      issues of bradycardia, bradycardia-tachycardia syndrome, and      Coumadin.     Luis Abed, MD, [facility]  Electronically Signed    JDK/MedQ  DD: 03/22/2008  DT: 03/23/2008  Job #: [id number]

## 2010-10-24 NOTE — H&P (Signed)
[name], [name]                  ACCOUNT NO.:  [id number]   MEDICAL RECORD NO.:  [id number]          PATIENT TYPE:  INP   LOCATION:  4731                         FACILITY:  [facility]   PHYSICIAN:  Thomas C. Wall, MD, FACCDATE OF BIRTH:  [birth year]   DATE OF ADMISSION:  03/05/2008  DATE OF DISCHARGE:                              HISTORY & PHYSICAL   PRIMARY CARDIOLOGIST:  Luis Abed, MD, [facility]   PRIMARY CARE Jaunice Mirza:  Thora Lance, MD   [name] is a very pleasant 77 year old Caucasian female with a known  history of paroxysmal atrial fib, who presented to [facility] Emergency  Room today complaining of palpitations with presyncopal symptoms.  She  has had intermittent burst of atrial fib since 2000, usually has been  controlled with a beta-blocker.  Over the last few weeks, she has  increased episodes of palpitations with increased frequency and  increased symptoms with increased duration.  She states last night  around 11, she took some trash out, had some palpitations then, went on  to bed, woke up around 3:20 this morning because of the palpitations.  She took an extra 12.5 of her Toprol and tried to relax, started to  develop a headache, took 2 Tylenol then laid down.  She finally fell  back asleep, slept till 6 this morning, got up, and took her thyroid  medicine, and went back to sleep.  She still was having irregular  rhythms, woke up around 7:15.  She states she got up to go the bathroom,  felt like she is going to pass out.  She called our office and was told  to come to the ER.  She denies any chest heaviness, tightness, or  pressure with these episodes.  Denies any over-the-counter cold-type  medications.   Past medical history includes:  1. PAF, previous Coumadin therapy.  The patient has a CHADS score of      1, and it was decided in the past not to use Coumadin on her, as      she has done so well.  2. Dyslipidemia.  3. Hypothyroidism.  4. Osteoarthritis.   Surgical history includes:  1. Hysterectomy.  2. Right knee surgery.  3. Rectocele.  4. Cataract surgery.  5. She has had 6 vaginal deliveries without any problems.   SOCIAL HISTORY:  She lives in [place] alone.  She is very active in  her church.  She enjoys walking.  She has 6 children.  Tries to follow a  heart healthy diet.   FAMILY HISTORY:  Mother deceased.  She had some type of heart rhythm  problem.  The patient remembers her wearing a heart monitor.  Father  deceased at age 21 with complications of a stroke following bypass.  He  has had previous MI.  She has brothers, who have had coronary artery  disease.   REVIEW OF SYSTEMS:  Positive for headache, shortness of breath, dyspnea  on exertion, palpitations, presyncope, and weakness.   Allergies include PENICILLIN.   MEDICINES:  1. Toprol XL 12.5 b.i.d.  2. Aspirin  325.  3. Lisinopril 10 mg.  4. VESIcare.  5. Vitamin D and calcium.  6. Fish oil.  7. Niacin.  8. Ocuvite.  9. Levoxyl 137 mcg.   PHYSICAL EXAMINATION:  VITAL SIGNS:  Temperature 96.9, heart rate 55,  respirations 18, blood pressure 127/64, sat 99% on room air.  GENERAL:  In no acute distress.  HEENT:  Unremarkable.  NECK:  Supple without lymphadenopathy.  No bruits.  No JVD.  CARDIOVASCULAR:  S1 and S2, brady rhythm at this time.  LUNGS:  Clear to auscultation.  SKIN:  Warm and dry.  ABDOMEN:  Soft, nontender, positive bowel sounds.  LOWER EXTREMITIES:  Without clubbing, cyanosis, or edema.  NEUROLOGIC:  Alert and oriented x3.   Chest x-Moeser is pending.  EKG, the patient is currently in sinus brady in  the 40s and 50s.  No acute ST or T-wave changes.  Lab work is pending.   IMPRESSION:  Paroxysmal atrial fibrillation with CHADS score of 1.  The  patient with increased frequency and duration of PAF with increased  dyspnea, presyncopal episodes, and now in sinus brady.  We ill admit the  patient for 23-hour observation, cycle enzymes, check a  TSH, baseline  lab work.  If no further problems, the patient will be discharged home  to follow up with Dr. Myrtis Ser.  I have scheduled her for outpatient  echocardiogram, adenosine Myoview, question whether or not the patient  might benefit from antiarrhythmic therapy.  We will go ahead and arrange  for followup with Dr. Myrtis Ser after her stress echo.  Dr. Valera Castle has  been in to examine and assess the patient and agrees with plan of care.      Dorian Pod, ACNP      Jesse Sans. Daleen Squibb, MD, [facility]  Electronically Signed    MB/MEDQ  D:  03/05/2008  T:  03/06/2008  Job:  [id number]

## 2010-10-24 NOTE — Assessment & Plan Note (Signed)
[facility]ALTHCARE                            CARDIOLOGY OFFICE NOTE   LASHYA, PASSE                         MRN:          [id number]  DATE:04/09/2008                            DOB:          [birth year]    [name] is seen for the followup of her atrial fibrillation and tachy-  brady syndrome.  I had seen her on March 22, 2008.  See my complete  note.  At that time, we decided to arrange for an event recorder to see  if we could further document the type of rhythm she is having, when she  has symptoms.  We know that she has bradycardia and tachycardia.  We did  not document well in the hospital, however, what her major problem was.  We know that she has good LV function.   Today, she returns and she tells me that on March 28, 2008, she had a  spell in the evening that she did called in.  She felt the rapid heart  rate.  She also feels poorly with this and gets a headache.  She also  has increased urination around this time.  She does not get chest pain.   I have reviewed carefully the information we have from her event  recorder.  In fact, she does have sinus bradycardia at many times, which  is fairly significant.  On March 28, 2008, she had definite episode of  atrial fibrillation with an increased rate.  This clearly corresponds  with the symptoms that she had at that time.   PAST MEDICAL HISTORY:   ALLERGIES:  PENICILLIN.   MEDICATIONS:  Niacin, calcium, Toprol 12.5 mg b.i.d., fish oil, aspirin,  Levoxyl, VESIcare, lisinopril, and vitamins.   OTHER MEDICAL PROBLEMS:  See the list below.   REVIEW OF SYSTEMS:  She is not having any GI or GU symptoms.  She has no  fevers or chills and no skin rashes.  Otherwise, her review of systems  is negative.   PHYSICAL EXAMINATION:  VITAL SIGNS:  Blood pressure today is 150/60.  Her pulse rate is 44.  GENERAL:  The patient is oriented to person, time, and place.  Affect is  normal.  HEENT:  No  xanthelasma.  She has normal extraocular motion.  There were  no carotid bruits.  NECK:  There is no jugular venous distention.  LUNGS:  Clear.  Respiratory effort is not labored.  CARDIAC:  An S1 with an S2.  There are no clicks or significant murmurs.  ABDOMEN:  Soft.  She has no peripheral edema.   As outlined above, I have carefully reviewed her event recorder data as  outlined above.  She has a significant bradycardia that is sinus.  She  also has symptomatic first tachycardia.  I think it is now time to  formally address the issue of pacemaker.  I believe she does need a  pacemaker.  I will refer her to EP for full evaluation to get their  formal opinion.  The patient is in agreement with this approach.   PROBLEMS:  1.  Status post hysterectomy.  2. Hypertension.  3. Paroxysmal atrial fibrillation that is symptomatic.  4. Hypothyroidism.  5. Bradycardia with brady-tachy syndrome.  6. Question of Coumadin.  She will need Coumadin in the future, but I      have not started it yet.     Luis Abed, MD, [facility]  Electronically Signed    JDK/MedQ  DD: 04/09/2008  DT: 04/09/2008  Job #: [id number]

## 2010-10-24 NOTE — Assessment & Plan Note (Signed)
[facility]                            CARDIOLOGY OFFICE NOTE   JANEECE, [name]                         MRN:          [id number]  DATE:05/19/2008                            DOB:          03-Jul-1927    [name] is seen for followup.  I had seen her on April 09, 2008, and  felt that a pacemaker was needed.  She saw Dr. Johney Frame and she did  receive a pacemaker.  She is doing great.  She is back to her  activities.  She feels much better.  She has had some rare palpitations.  When her pacer was interrogated, she had not been having any atrial fib.  She knows to take extra beta-blocker if she has a prolonged episode of  increased heart rate.   PAST MEDICAL HISTORY:   ALLERGIES:  PENICILLIN.   MEDICATIONS:  See the flow sheet.   REVIEW OF SYSTEMS:  She has no GI or GU symptoms.  She is not having any  headache, fevers, or chills.  Her review of systems, otherwise, is  negative.   PHYSICAL EXAMINATION:  VITAL SIGNS:  Blood pressure is 140/82 with a  pulse of 75.  GENERAL:  The patient is oriented to person, time, and place.  Affect is  normal.  HEENT:  No xanthelasma.  She has normal extraocular motion.  NECK:  There are no carotid bruits.  There is no jugular venous  distention.  LUNGS:  Clear.  Respiratory effort is not labored.  CARDIAC:  S1 with an S2.  There are no clicks or significant murmurs.  Her pacemaker site is nicely healed.  ABDOMEN:  Soft.  EXTREMITIES:  She has no significant peripheral edema.   Problems are listed on my note of April 09, 2008.  #7.  Status post permanent transvenous pacemaker.  She is doing very  well.  She is not having significant burst of atrial fibrillation.  I  will not change her medications at this time.  She will be following up  with Dr. Johney Frame post pacer and I will see her back for Cardiology  followup in 3 months.     Luis Abed, MD, [facility]  Electronically Signed    JDK/MedQ  DD:  05/19/2008  DT: 05/20/2008  Job #: [id number]

## 2010-10-24 NOTE — Discharge Summary (Signed)
NAMELIVI, MCGANN                  ACCOUNT NO.:  [id number]   MEDICAL RECORD NO.:  [id number]          PATIENT TYPE:  INP   LOCATION:  4731                         FACILITY:  [facility]   PHYSICIAN:  Learta Codding, MD,FACC DATE OF BIRTH:  [birth year]   DATE OF ADMISSION:  03/05/2008  DATE OF DISCHARGE:  03/06/2008                               DISCHARGE SUMMARY   PRIMARY CARDIOLOGIST:  Luis Abed, MD, [facility]   PRIMARY CARE PHYSICIAN:  Thora Lance, MD   PROCEDURES PERFORMED DURING HOSPITALIZATION:  None.   FINAL DISCHARGE DIAGNOSES:  1. Presyncope.  2. Paroxysmal atrial fibrillation with probable sick sinus syndrome.  3. Rule out chronotropic insufficiency.  4. Shortness of breath with dyspnea.      a.     Rule out coronary artery disease with already scheduled       myocardial perfusion study and echocardiogram.  5. Sinus bradycardia.   HISTORY OF PRESENT ILLNESS:  This is a 77 year old Caucasian female who  woke up secondary to palpitations around 3 a.m. early morning hours on  day of admission.  The patient took an extra 2.5 mg of metoprolol, which  she normally takes b.i.d. along with Tylenol secondary headache and laid  down.  She slept until around 6 a.m. and got up and took her regular  medications, still felt her heart rate irregular, called the office, and  was told to come to the emergency room.  She felt some presyncope  lasting several minutes and had some episodes of atrial fibrillation in  the past with several episodes of presyncope lately.  As a result of  this, the patient came to the emergency room at the request of the  office and was seen and examined by Dr. Valera Castle.  The patient was  admitted to rule out myocardial infarction and monitored overnight.  The  patient's blood pressure was normal at 127/64, heart rate 55, and  respirations 18 on admission.   The following day, the patient was seen and examined by Dr. Lewayne Bunting.  There were no evidence of  arrhythmias or pauses or dyspnea on exertion  during overnight hours.  The patient's labs were normal.  Cardiac  enzymes were negative.  The patient was found to be stable for discharge  with followup appointment with Dr. Myrtis Ser, and previously scheduled stress  Myoview, echocardiogram.  Dr. Andee Lineman recommends an outpatient CardioNet  to evaluate for premature pauses and arrhythmias.  This will be set up  in our office once the patient has been discharged.   DISCHARGE LABORATORIES:  Sodium 141, potassium 4.1, chloride 105, CO2 of  29, BUN 17, creatinine 0.93, and glucose 89.  Cardiac enzymes are  negative x3.  BNP 226.  Hemoglobin 15.1, hematocrit 43.6, white blood  cells 6.2, and platelets 249.   Blood pressure 133/51, heart rate of 54, respirations 17, and  temperature 97.8.   DISCHARGE MEDICATIONS:  1. Metoprolol 12.5 mg twice a day.  2. Lisinopril 10 mg daily.  3. Niacin 500 mg daily.  4. Aspirin 325 mg daily.  5. Levothyroxine  137 mcg daily.  6. Enablex 15 mg daily.  7. Lovaza 1 g daily.  8. VESIcare as directed.   ALLERGIES:  PENICILLIN.   FOLLOWUP PLANS AND APPOINTMENT:  1. The patient is scheduled for a stress Myoview and an echocardiogram      on March 17, 2008, at 8:15 a.m. at the [facility] Cardiology office.  2. The patient is to follow up with Dr. Willa Rough on March 22, 2008, at 2:15 p.m.  3. If above stress test and echocardiogram are normal, recommendations      for CardioNet recorder has been issued per Dr. Andee Lineman, to be      followed at Dr. Henrietta Hoover discretion.   Time spent with the patient to include physician time 40 minutes.       Bettey Mare. Lyman Bishop, NP      Learta Codding, MD,FACC  Electronically Signed    KML/MEDQ  D:  03/06/2008  T:  03/07/2008  Job:  [id number]

## 2010-10-24 NOTE — Op Note (Signed)
[name], TROOP                  ACCOUNT NO.:  [id number]   MEDICAL RECORD NO.:  [id number]          PATIENT TYPE:  INP   LOCATION:  3703                         FACILITY:  [facility]   PHYSICIAN:  [name], MD       DATE OF BIRTH:  May 16, 1928   DATE OF PROCEDURE:  04/19/2008  DATE OF DISCHARGE:                               OPERATIVE REPORT   PREPROCEDURE DIAGNOSES:  1. Paroxysmal atrial fibrillation.  2. Tachycardia-bradycardia syndrome.  3. Sick sinus syndrome.   PROCEDURE:  Dual-chamber pacemaker implantation.   DESCRIPTION OF PROCEDURE:  Informed written consent was obtained and the  patient was brought to the Electrophysiology lab in the fasting state.  She was adequately sedated with intravenous Versed and fentanyl as  outlined in the nursing report.  The patient's left chest was prepped  and draped in the usual sterile fashion by the EP lab staff.  The skin  overlying the left deltopectoral region was infiltrated with lidocaine  for local anesthetic.  A 5-cm incision was then made into the left  deltopectoral region.  A pacemaker pocket was fashioned using a  combination of blunt and sharp dissection.  Electrocautery was used to  assure hemostasis.  The left cephalic vein was visualized and  cannulated.  Through this vein, a Biotronik Setrox S - 45, (serial  [id number]), right atrial lead and a Biotronik Selox ST - 60 (serial  [id number]), right ventricular lead were advanced into the right atrial  appendage and right ventricular apex positions respectively.  The leads  were then secured to the left deltopectoral fascia using #2 silk suture  over the suture sleeve.  Fluoroscopic visualization was used throughout  the case.  Initial lead measurements revealed an atrial lead P-wave of 3  mV with an impedance of 527 ohms and a threshold of 1.3 volts at 0.4  msec.  The right ventricular lead R-wave measured 22 mV with an  impedance of 1767 ohms (high voltage lead) and a  threshold of 0.4 volts  at 0.4 msec.  The leads were then connected to a Biotronik Cylos DR  (serial [id number]) dual-chamber pacemaker.  The pacemaker was placed  into the subcutaneous pocket.  The pocket was irrigated with copious  gentamicin solution.  The pocket was then closed in 2 layers with 2-0  Vicryl suture for the subcutaneous and subcuticular layers.  Steri-  Strips and a sterile dressings were then applied.  There were no early  apparent complications.   CONCLUSIONS:  1. Successful dual-chamber pacemaker implanted.  2. No early apparent complications.      [name], MD  Electronically Signed     JA/MEDQ  D:  04/19/2008  T:  04/19/2008  Job:  [id number]   cc:   Luis Abed, MD, [facility]

## 2010-10-24 NOTE — Discharge Summary (Signed)
NAMEZELINA, JIMERSON                  ACCOUNT NO.:  [id number]   MEDICAL RECORD NO.:  [id number]          PATIENT TYPE:  OIB   LOCATION:  3703                         FACILITY:  [facility]   PHYSICIAN:  [name], MD       DATE OF BIRTH:  [birth year]   DATE OF ADMISSION:  04/19/2008  DATE OF DISCHARGE:  04/20/2008                               DISCHARGE SUMMARY   ALLERGIES:  This patient has an allergy to PENICILLIN.   TIME FOR THIS DICTATION AND EXAM:  Greater than 35 minutes.   FINAL DIAGNOSES:  1. Discharging day #1 status post Biotronik CYLOS DR dual-chamber      pacemaker.  2. Paroxysmal atrial fibrillation, on chronic Coumadin therapy.  3. Tachy-brady syndrome with presyncope, had marked bradycardia.  4. Symptomatic tachycardia, which leads to the patient with fatigue      and the patient feels palpitations.   SECONDARY DIAGNOSES:  1. Hyperthyroidism, noted as cause of her original atrial fibrillation      onset.  2. Hypertension.  3. Total abdominal hysterectomy.  4. Status post rectocele repair.  5. Right total knee arthroplasty.  6. Right cataract repair.   PROCEDURES:  On April 19, 2008, implant of the Biotronik dual-chamber  pacemaker, Dr. [name].  The patient has no hematoma at the  incision site and the pain is controlled well with oral analgesia.   BRIEF HISTORY:  Ms. Escalera is a 77 year old female.  She is referred by Dr.  Myrtis Ser to Dr. Johney Frame for consideration of pacemaker implantation.  The  patient has a history of paroxysmal atrial fibrillation.  She also has  found to have tachy-brady syndrome   The patient was initially diagnosed with atrial fibrillation in 2000.  She was also diagnosed with hyperthyroidism.  She had iodine-131  ablation.  She has done well for about 3-4 years without any further  atrial fibrillation; however, over the past 3-4 years, she has had  recurrent symptoms of atrial fibrillation.  They are increasing in  frequency and duration  lately.   The patient has been treated with Toprol-XL 12.5 mg twice daily;  however, the patient has symptomatic bradycardia and it has limited her  activities.  Was hospitalized several weeks ago following an episode of  presyncope.  This was thought to be secondary to marked bradycardia.  The patient also has atrial fibrillation with rapid rates, which cause  palpitation, later the patient fatigued.   THERAPEUTIC STRATEGIES FOR ATRIAL FIBRILLATION:  It is rapid or limited  due to the proclivity for the patient to have bradycardia.  At this  time, pacemaker implantation seems is a good choice.  The risks and  benefits have been described to the patient and she wishes to proceed.   HOSPITAL COURSE:  The patient presents electively on April 19, 2008.  She underwent implantation of a Biotronik dual-chamber pacemaker without  complication.  She is discharging postprocedure day #1.  She is asked to  keep her incision dry for the next 7 days and to sponge bathe until  Monday, April 26, 2008.  MEDICATIONS:  1. A new one, Rythmol 225 mg twice daily.  2. Increases her Toprol-XL to 25 mg daily.  3. Coumadin 5 mg daily or as directed by Coumadin Clinic.  4. Levoxyl 137 mcg daily.  5. VESIcare 10 mg daily.  6. Lisinopril 10 mg daily.  7. Niacin 500 mg daily.   She has followup at [facility], [address].  1. Coumadin Clinic on Wednesday, April 21, 2008.  2. Stress test on Monday, April 26, 2008, at 9 o'clock.  She is      asked to eat nothing after midnight on Sunday, April 25, 2008.      She may take her morning meds with sips of water.  This stress test      is to evaluate for ischemia with the patient on Rythmol.   She also follows up at [facility] regarding her pacemaker.  1. Pacer Clinic on Monday, May 10, 2008, at 9:20.  2. Sees Dr. Johney Frame on Tuesday, July 20, 2008, at 10:40.  At that      time, Dr. Johney Frame will also evaluate for  further therapies for her      atrial fibrillation.  Of note, the patient did have a stress test      on March 17, 2008.  The study showed that the patient had an      ejection fraction of 76%.  Overall, left ventricular function is      normal.  Normal uptake in all areas of the myocardium.   LABORATORY STUDIES:  Pertinent to this admission were drawn on April 12, 2008.  White cells 6.4, hemoglobin 14.5, hematocrit 41, platelets of  233.  Protime 11, INR 0.9, sodium 141, potassium 3.9, chloride 104,  carbonate 32, glucose 85, BUN is 15, creatinine is 0.9.      Maple Mirza, [place]      [name], MD  Electronically Signed    GM/MEDQ  D:  04/20/2008  T:  04/20/2008  Job:  [id number]   cc:   Luis Abed, MD, [facility]  Thora Lance, M.D.

## 2010-10-24 NOTE — Letter (Signed)
April 12, 2008    Luis Abed, MD, [facility]  [address]  [address]  [place], [place] [address]   RE:  [name]  MRN:  [id number]  /  DOB:  February 14, 1928   Dear [name],   It was my pleasure to see your patient [name] in EP consultation  today.  As you recall, she is a very pleasant 77 year old female with  paroxysmal atrial fibrillation and tachycardia-bradycardia syndrome.  She reports initially being diagnosed with atrial fibrillation in 2000  after developing symptomatic heart racing.  She was evaluated by you  and diagnosed with hyperthyroidism which was successfully treated with  radioactive iodine administration.  She did well for 3-4 years without  any further symptomatic episodes of atrial fibrillation.  Unfortunately  over the past 3-4 years, she has developed recurrent symptoms of atrial  fibrillation.  Initially, these episodes occurred only once per year,  however, they have subsequently increased in both frequency and  duration.  Presently, she reports that every 3-4 weeks, she has  symptomatic heart racing lasting for several hours.  These episodes  typically occur at night and worsened if she has been excessively active  during the preceding day.  She has been treated with Toprol-XL 12.5 mg  twice daily, however, significant symptomatic bradycardia has limited  her therapies.  She was hospitalized several weeks ago following an  episode of presyncope which was felt to be possibly secondary to  symptomatic bradycardia.  Upon recent evaluation in your office, an  event monitor was placed which revealed sinus bradycardia with heart  rates in the 40s and 50s as well as atrial fibrillation with rapid  ventricular rates up to 150 beats per minute.  The patient reports  significant symptoms of palpitations and irregular heart beating at that  time.  She reports that after episodes of atrial fibrillation that she  frequently feels washed out for the following day.  She is  otherwise  without chest pain, nausea, vomiting, diaphoresis, or syncope.  She  reports that she was previously very active, walking approximately 2  miles per day.  Over the past 7 months, she has had difficulty with  recurrent radicular pain within her left leg and therefore has  significantly decreased her exercise ability.  The patient also reports  frequent fatigue of unclear etiology, but decreased exercise capacity  associated with this.  She is otherwise without complaint today.   PAST MEDICAL HISTORY:  1. Paroxysmal atrial fibrillation (as above).  2. Hyperthyroidism, status post radioactive iodine administration in      2000.  3. Symptomatic tachycardia-bradycardia syndrome (as above).  4. Hypertension.  5. Degenerative disk disease.  6. G6,P6.  7. Status post total abdominal hysterectomy.  8. Status post rectocele repair.  9. Status post right knee arthroscopy.  10.Status post right cataract surgery.   MEDICATIONS:  1. Niacin 500 mg daily.  2. Multivitamin daily.  3. Calcium plus vitamin D 600 mg daily.  4. Toprol-XL 12.5 mg b.i.d.  5. Fish oil 2000 mg daily.  6. Aspirin 325 mg daily.  7. Levothyroxine 137 mcg daily.  8. VESIcare 10 mg daily.  9. Lisinopril 10 mg daily.   ALLERGIES:  PENICILLIN causes rash.   SOCIAL HISTORY:  The patient lives in [place], [place], alone.  She is retired.  She has a history of tobacco, but quit approximately 20  years ago.  She denies alcohol or drug use.   FAMILY HISTORY:  She denies  diabetes.  The patient has a brother with  ITP and a second brother, who had coronary artery disease in his 89s.   REVIEW OF SYSTEMS:  All systems were reviewed and negative except as  outlined in the HPI above and as documented below.  The patient reports  polyuria without UTI symptoms for which she has been treated  successfully with VESIcare.   PHYSICAL EXAMINATION:  VITAL SIGNS:  Blood pressure 128/62, heart rate  56, respirations  18, and weight 188 pounds.  GENERAL:  The patient is an elderly female in no acute distress.  She  appears younger than her stated age.  HEENT:  Normocephalic and atraumatic.  Sclerae clear.  Conjunctivae  pink.  Oropharynx clear.  NECK:  Supple.  No JVD, lymphadenopathy, or bruits.  LUNGS:  Clear to auscultation bilaterally.  HEART:  Bradycardic, regular rhythm.  No murmurs, rubs, or gallops.  GI:  Soft, nontender, and nondistended.  Positive bowel sounds.  EXTREMITIES:  No clubbing, cyanosis, or edema.  NEUROLOGIC:  Strength and sensation are intact.  PSYCH:  Euthymic mood and full affect.  SKIN:  No ecchymosis or lacerations.  MUSCULOSKELETAL:  No deformity or atrophy.   IMPRESSION:  Ms. Balash is a very pleasant 77 year old female with a  history of hypertension, symptomatic tachycardia-bradycardia syndrome,  and paroxysmal atrial fibrillation, who presents today for EP  consultation.  [name], I agree with you that therapeutic strategies for  atrial fibrillation with rapid ventricular rates are very limited due to  the patient's symptomatic episodes of bradycardia.  Therapeutic  strategies for atrial fibrillation including both medicine and pacemaker  implantation were discussed with discussed in detail with the patient  today.  I have also reviewed the patient's risk for stroke with atrial  fibrillation given her CHADS score of 2.  I believe that the best  strategy for this patient would be pacemaker implantation which will  then allow for aggressive medical therapy of her atrial fibrillation.   PLAN:  Risks, benefits, and alternatives to pacemaker implantation were  discussed in detail with the patient today.  These risk include, but are  not limited to bleeding, pain, infection, perforation, pericardial  effusion, pneumothorax, and renal failure.  The patient accepts these  risks and wishes to proceed with pacemaker implantation at the next  available date.  We will therefore plan  to implant a dual-chamber  pacemaker at the next available time.  After pacemaker has been  implanted, we can then be more intentional with beta-blocker therapy for  control of rapid ventricular rates.  I would also consider initiation of  an antiarrhythmic medication  probably propafenone at the same time.  I have taken the liberty to  initiate the patient on Coumadin today with a goal INR of 2-3.   I appreciate the opportunity participating in the care of Ms. Matsumura.    Sincerely,      [name], MD  Electronically Signed    JA/MedQ  DD: 04/12/2008  DT: 04/13/2008  Job #: [id number]   CC:    Thora Lance, M.D.

## 2010-10-24 NOTE — Assessment & Plan Note (Signed)
[facility]                         ELECTROPHYSIOLOGY OFFICE NOTE   [name], [name]                         MRN:          [id number]  DATE:07/20/2008                            DOB:          1927-12-31    INTRODUCTION:  [name] is a pleasant 77 year old female with a history  of atrial fibrillation and tachycardia-bradycardia syndrome status post  implantation of a Biotronik Cylos [id number] dual-chamber pacemaker,  April 19, 2008, who presents today for followup.   PROBLEM LIST:  1. Paroxysmal atrial fibrillation.  2. Tachycardia-bradycardia syndrome.  3. Sick sinus syndrome.  4. Successful implantation of a Biotronik Cylos DR dual-chamber      pacemaker, April 19, 2008.  The right atrial lead is a Biotronik      Setrox S-45 lead and the right ventricular lead is a Biotronik      Selox ST-60 lead.  5. Hypertension.   INTERVAL HISTORY:  [name] presents today for routine followup.  She  reports doing very well since her pacemaker was implanted.  She reports  very rare palpitations.  She reports no difficulties with her pacemaker  and feels that her pocket has healed quite nicely.  She denies chest  pain, shortness of breath, presyncope or syncope, and is otherwise  without complaint today.   PHYSICAL EXAMINATION:  VITALS:  Blood pressure 116/60, heart rate 64,  respirations 18.  GENERAL:  The patient is a well-appearing female in no acute distress.  She is alert and oriented x3.  HEENT:  Normocephalic, atraumatic.  Sclerae clear.  Conjunctivae pink.  Oropharynx clear.  NECK:  Supple.  No JVD, thyromegaly, or bruits.  LUNGS:  Clear to auscultation bilaterally.  HEART:  Regular rate and rhythm.  No murmurs, rubs, or gallops.  GASTROINTESTINAL:  Soft, nontender, nondistended, positive bowel sounds.  EXTREMITIES:  No clubbing, cyanosis, or edema.  SKIN:  The patient's pacemaker pocket appears to be well healed with no  evidence of hematoma  or infection.  NEUROLOGIC:  Nonfocal.   DEVICE INTERROGATION:  The patient's dual-chamber pacemaker is  interrogated today and found to be functioning appropriately and the DDD  pacing mode with a lower rate limit of 60 and an upper tracking rate of  120 beats per minute.  She is paced in the atrium 92% of the time and in  the ventricle 32% of the time.  90% of her heart rates are between 60  and 90 beats per minute with the upper sensed heart rate of 130 beats  per minute.  No mode switches are observed.  The battery status is good  with a voltage of 2.78 volts.  The atrial lead P-wave measures 2.5  millivolts with an impedance of 593 ohms and a threshold of 0.9 volts at  0.4 milliseconds.  The right ventricular lead R-wave measures 35  millivolts with an impedance of 1180 ohms and a threshold of 0.7 volts  at 0.5 milliseconds.  The right atrial and right ventricular pacing  outputs were decreased to 2.0 volts at 0.4 milliseconds and 2.5 volts at  0.4 milliseconds respectively.  Rate response therapy was programmed on.  No other changes were made today.   IMPRESSION:  [name] is a very pleasant female with a history of  paroxysmal atrial fibrillation and tachycardia-bradycardia syndrome  status post pacemaker implantation.  She appears to be doing quite well  with minimal symptomatic episodes of atrial fibrillation.  I would  therefore recommend that we continue her on Rhythmol and Toprol-XL for  rate control.  She appears to be tolerating Coumadin without any  evidence of bleeding.  Her pacemaker appears to be functioning  appropriately.  Her blood pressure is at goal today.  Her device was  reprogrammed as noted above.  No other medication changes were made  today.   PLAN:  1. Followup with Dr. Myrtis Ser as previously scheduled.  2. Followup with me in 1 year.     [name], MD  Electronically Signed    JA/MedQ  DD: 07/20/2008  DT: 07/21/2008  Job #: [id number]   cc:   Luis Abed, MD, [facility]

## 2010-10-27 NOTE — Op Note (Signed)
[name], [name]                  ACCOUNT NO.:  [id number]   MEDICAL RECORD NO.:  [id number]          PATIENT TYPE:  INP   LOCATION:  9374                          FACILITY:  [facility]   PHYSICIAN:  [name], M.D.   DATE OF BIRTH:  [birth year]   DATE OF PROCEDURE:  02/21/2005  DATE OF DISCHARGE:                                 OPERATIVE REPORT   PREOPERATIVE DIAGNOSIS:  Symptomatic rectocele.   POSTOP DIAGNOSES:  Symptomatic rectocele.   PROCEDURE:  Posterior colporrhaphies with perineorrhaphy.   SURGEON:  [name], M.D.   ASSISTANT:  Cordelia Pen A. Rosalio Macadamia, M.D. and Gerri Spore B. Earlene Plater, M.D.   COMPLICATIONS:  None.   ESTIMATED BLOOD LOSS:  100 mL.   SPECIMENS:  None.   FINDINGS:  Large rectocele extending 3/4 of the way up the vagina.   INDICATIONS:  This is a 77 year old gravida 6, para 6 white female with a  large symptomatic rectocele who presents today for repair. The patient's  symptoms consist of a pressure sensation and a bulge in the vagina as well  as having to splint in order to move her bowels. Prior to procedure the  risks, benefits, alternatives of the procedure reviewed with the patient in  detail. We discussed risks which include but are not limited to bleeding,  infection, fistula formation, injury to the rectum which could require  additional surgery in the future and anesthetic related risks including DVT,  the patient voiced understanding of all these risks before proceeding to the  OR and informed consent was obtained.   PROCEDURE:  The patient was taken to the operating room where she was given  a general anesthetic. She was then prepped and draped in the usual sterile  fashion and placed in the dorsal lithotomy position. A red rubber catheter  was then used to drain the bladder. Allis clamps were then placed on the  edge of the original hymeneal ring and a wedge-shaped perineorrhaphy  incision was then made. The vaginal mucosa was then undermined  using Strully  scissors and a linear incision was then made with the scissors along the  length of the rectocele. The edges of the vaginal mucosa was then held on  tension with Allis clamps and the underlying rectum was then dissected off  the vaginal mucosa until the endopelvic fascia could be identified. Once  adequate dissection had been performed, there was very large midline defect  in the midline. The rectocele was reduced by placing a pursestring Vicryl  suture and then the endopelvic fascia was reapproximated using interrupted  figure-of-eight Vicryl sutures. Once adequate reapproximation had been  performed and any areas of bleeding cauterized with the Bovie cautery, a  Rectal exam was performed and there was no evidence of any suture in the  rectum and there was good reduction of the rectocele.  There was no  enterocele.  At this point the edges of vaginal mucosa were then trimmed and  the vagina was then reapproximated in the midline with interrupted Vicryl  suture. Just proximal to the hymeneal ring, there was area bleeding noted  coming from the edge of the repair that was made hemostatic using the Bovie  and a small piece of Gelfoam was placed for additional hemostasis. Once  hemostasis was secured the remainder of the vaginal incision was then  reapproximated. The perineorrhaphy incision was repaired using 3-0 Vicryl to  recreate the perineal body and the skin was then closed with a subcuticular  3-0 Vicryl suture. Rectovaginal exam was then performed. There was good  support of the rectovaginal septum. The vagina was then packed with 2-inch  plain gauze soaked in Estrace cream and Foley catheter was placed. The  patient tolerated the procedure very well without complications and was  taken to the recovery room awake and in stable condition.      [name], M.D.  Electronically Signed     JW/MEDQ  D:  02/21/2005  T:  02/22/2005  Job:  [id number]

## 2010-10-27 NOTE — Discharge Summary (Signed)
NAMEAISLYN, [name]                  ACCOUNT NO.:  [id number]   MEDICAL RECORD NO.:  [id number]          PATIENT TYPE:  OBV   LOCATION:  9374                          FACILITY:  [facility]   PHYSICIAN:  [name], M.D.   DATE OF BIRTH:  [birth year]   DATE OF ADMISSION:  02/21/2005  DATE OF DISCHARGE:  02/22/2005                                 DISCHARGE SUMMARY   ADMISSION DIAGNOSIS:  Symptomatic rectocele.   HISTORY OF PRESENT ILLNESS:  Please see admission history and physical for  details.  Briefly, this is a 77 year old gravida 6, para 77, white female who  underwent repair of a symptomatic rectocele on February 21, 2005.  The  patient's surgery was uncomplicated.  Medical history significant for atrial  fibrillation not requiring anticoagulation.  The patient was monitored in  the hospital the night following her surgery for routine postoperative care.  On postoperative day #1 the patient was ambulating well, was voiding without  difficulty, passing flatus and tolerating a regular diet, and her pain was  controlled.  Cardiac rhythm remained regular throughout her hospital stay.  She was discharged to home on postoperative day #1 in good condition.  Labs  on postoperative day #1 showed a white count of 10.6, hematocrit of 36.7,  hemoglobin of 12.4, platelet count of 204.   DISPOSITION:  To home.   CONDITION:  Good.   MEDICATIONS:  Percocet one to two tablets q.4-6h. p.r.n. pain, Colace 100 mg  twice daily.  The patient is to be continued on her home medications, which  include Levoxyl, Toprol XL, Crestor, Fosamax, calcium supplementation,  aspirin, folic acid, multivitamin.   INSTRUCTIONS:  Routine postop instructions were reviewed with the patient.  She is to call for fever greater than 100.4, pain not relieved with pain  medication, or increased vaginal bleeding.  She was given instructions not  to place anything in the vagina or rectum for six weeks, and she is to  follow up  for routine postop visit in four weeks.      [name], M.D.  Electronically Signed     JW/MEDQ  D:  04/09/2005  T:  04/10/2005  Job:  [id number]

## 2010-10-27 NOTE — H&P (Signed)
NAMEWILHELMENIA, [name]                  ACCOUNT NO.:  [id number]   MEDICAL RECORD NO.:  [id number]         PATIENT TYPE:  WOBV   LOCATION:                                 FACILITY:   PHYSICIAN:  [name], M.D.        DATE OF BIRTH:   DATE OF ADMISSION:  02/21/2005  DATE OF DISCHARGE:  02/22/2005                                HISTORY & PHYSICAL   PREOPERATIVE DIAGNOSIS:  Symptomatic rectocele.   HISTORY OF PRESENT ILLNESS:  This is a 77 year old white female, gravida 6,  para 6, who has a very large symptomatic rectocele, who presents today for  repair of rectocele.  The patient's symptoms are such that she can palpate a  bulge in the vagina and also has to splint in order to move her bowels.  She  denies any involuntary loss of stool or flatus.  She does have urge  incontinence symptoms and has been evaluated by urology, who performed  urodynamic studies which showed no evidence of any stress incontinence and  no indication for a concomitant procedure with a transvaginal tape.   PAST OBSTETRICAL HISTORY:  Vaginal delivery x6.   GYNECOLOGIC HISTORY:  Status post total abdominal hysterectomy with some  sort of bladder suspension performed in 1973 and was on estrogen replacement  therapy for menopausal symptoms, but discontinued.   PAST MEDICAL HISTORY:  Hypothyroidism, atrial fibrillation, osteoporosis,  hypertriglyceridemia.   PAST SURGICAL HISTORY:  Total abdominal hysterectomy with bladder suspension  and radiation to the thyroid.   FAMILY HISTORY:  Significant for heart disease, no history of breast,  ovarian, uterine, or colon cancer.   SOCIAL HISTORY:  Denies tobacco, alcohol, or drugs.   MEDICATIONS:  1.  Crestor.  2.  Toprol.  3.  Levoxyl.  4.  Aspirin.  5.  Fosamax.   ALLERGIES:  PENICILLIN.   REVIEW OF SYSTEMS:  Denies chest pain, shortness of breath, fever, chills,  sweats, nausea, vomiting, or diarrhea.  Complains of pressure in the vagina  and loss of  urine when the bladder is too full.   PHYSICAL EXAMINATION:  VITAL SIGNS:  Blood pressure 110/70, weight 179,  height 5 feet 3-3/4 inches.  GENERAL:  She is a well-developed, well-nourished, white female who is in no  acute distress.  HEART:  Regular rate and rhythm.  LUNGS:  Clear to auscultation bilaterally.  ABDOMEN:  Soft, nontender, and nondistended with no palpable masses.  Liver  and spleen are normal.  There is no hernia.  EXTREMITIES:  No cyanosis, clubbing, or edema.  Distal pulses are intact.  PELVIC:  The external genitalia appear normal.  The vagina is without  lesions and with good vaginal apex support.  Bimanual examination; there are  no palpable masses.  Adnexa are not palpable.  There is a very large  rectocele with a bulge noted at the introitus.  Rectovaginal examination  confirms the above examination with no obvious enterocele.  Good rectal  tone.   ASSESSMENT:  A 77 year old gravida 6, para 6 white female with symptomatic  rectocele, who presents for  repair.   PLAN:  Will proceed with posterior colporrhaphy and possible perineorrhaphy  for repair of rectocele.  Risks, benefits, and alternatives to the procedure  reviewed with the patient in detail.  We discussed the risks which include  but are not limited to, hemorrhage, infection, anesthetic-related  complications, DVT and pulmonary embolism, and damage to the rectum at the  time of the procedure.  The patient voiced understanding of the above risks  and desires to proceed.  Informed consent has been obtained.      [name], M.D.  Electronically Signed     JW/MEDQ  D:  02/20/2005  T:  02/20/2005  Job:  [id number]

## 2010-10-27 NOTE — Op Note (Signed)
NAME:  [name], [name]                            ACCOUNT NO.:  [id number]   MEDICAL RECORD NO.:  [id number]                   PATIENT TYPE:  AMB   LOCATION:  DAY                                  FACILITY:  [facility]   PHYSICIAN:  Ollen Gross, M.D.                 DATE OF BIRTH:  1928/02/28   DATE OF PROCEDURE:  10/14/2003  DATE OF DISCHARGE:                                 OPERATIVE REPORT   PREOPERATIVE DIAGNOSES:  Right knee medial and lateral meniscal tears.   POSTOPERATIVE DIAGNOSES:  Right knee medial and lateral meniscal tears.   PROCEDURE:  Right knee arthroscopy with medial and lateral meniscal  debridement.   SURGEON:  Ollen Gross, M.D.   ANESTHESIA:  General.   ESTIMATED BLOOD LOSS:  Minimal.   DRAINS:  Hemovac x1.   COMPLICATIONS:  Stable to recovery.   CONDITION:  Stable to recovery.   BRIEF CLINICAL NOTE:  Ms. Magos is a 77 year old female who has a 1-2 month  history of significant medial and lateral discomfort in her right knee.  She  has had significant mechanical symptoms.  Examination and history are  consistent with meniscal tear confirmed by MRI. She presents now for  arthroscopy and debridement.   DESCRIPTION OF PROCEDURE:  After successful administration of general  anesthetic, a tourniquet was placed high on the right thigh and right lower  extremity prepped and draped in the usual sterile fashion.  Standard  superomedial and inferolateral incisions are made after infiltration of the  three portals with a total of 10 mL of 1% Xylocaine. The incisions are made,  the inflow cannula passed superomedial, camera passed inferolateral.  Arthroscopic visualization proceeds.   The suprapatellar area shows no loose bodies. The undersurface of the  patella looks normal. The trochlea shows some grade 1 change but no focal  chondral defects. The medial and lateral gutters are visualized, there are  no loose bodies.  Flexion and valgus force is applied to the knee  and the  medial compartment entered. Immediately upon entering, it is noted that she  has some grade 2 and 3 change on focal areas of the medial and femoral  condyle but once again no full thickness cartilage lost. There was a  degenerative tear throughout the body and posterior horn of the medial  meniscus.  It is debrided back to a stable meniscal remnant with baskets and  a 4.2 mm shaver.  It is probed and found to be stable. The intercondylar  notch is visualized, ACL looks normal.  The lateral compartment is entered  and there is a degenerative tear in the body extending to the posterior horn  of the lateral meniscus. It is debrided back to a stable base with baskets  and a shaver. The rest of the joint is again inspected and there is no other  focal abnormalities.  The arthroscopic equipment is then  removed from the  inferior portals which are closed with interrupted 4-0 nylon.  20 mL of  0.25% Marcaine with epinephrine are injected through the inflow cannula and  a  Hemovac drain is threaded through the cannula.  The cannula removed and the  incision closed with an interrupted 4-0 nylon. The drain is then hooked to  suction and a bulky sterile dressing applied and she is awakened and  transported to recovery in stable condition.                                               Ollen Gross, M.D.    FA/MEDQ  D:  10/14/2003  T:  10/14/2003  Job:  [id number]

## 2010-10-27 NOTE — Assessment & Plan Note (Signed)
[facility]                            CARDIOLOGY OFFICE NOTE   AYANE, DELANCEY                         MRN:          [id number]  DATE:06/17/2006                            DOB:          1928/03/02    Ms. Seith is doing well.  In the remote past, she had some atrial  fibrillation.  We had very carefully considered Coumadin.  She had no  recurrence, and she is not on Coumadin.  She is on full-dose aspirin.  She is not having any chest pain or shortness of breath.  She is stable.   PAST MEDICAL HISTORY:  Allergies:  Penicillin.   MEDICATIONS:  1. Vitamins.  2. Levoxyl 0.112.  3. Toprol XL 12.5 in the morning, 12.5 in the evening.  4. Fish oil.  5. Aspirin 325.  6. Vesicare.  7. Tricor.   OTHER MEDICAL PROBLEMS:  See the list below.   REVIEW OF SYSTEMS:  She has had several stressors in her life.  She also  has had cataract surgery.  At this point, she is otherwise relatively  stable, and her review of systems otherwise is negative.   PHYSICAL EXAMINATION:  VITAL SIGNS:  Blood pressure today is elevated at  188/75.  She and I discussed this, and the results will be sent to Dr.  Timothy Lasso.  Pulse is 56.  The patient is oriented to person, time and place,  and her affect is normal.  There is no xanthelasma.  She has no carotid  bruits.  There is no jugular venous distention.  LUNGS:  Clear.  Respiratory effort is non-labored.  CARDIAC:  Reveals an S1 and S2.  There are no clicks or significant  murmurs.  ABDOMEN:  Soft.  There are no masses or bruits.  She has no peripheral  edema.   EKG reveals sinus rhythm with mild sinus bradycardia.   PROBLEMS:  1. Status post hysterectomy.  2. Hypertension.  This has been borderline in the past.  I have talked      with her about this, and she will be seeing Dr. Timothy Lasso in the near      future.  At that time, I am sure that they will want to consider      addition of other meds for her blood pressure.  3.  Very rare episode of atrial fib in the remote past.  She is not on      Coumadin.  She is on full-dose aspirin.  Over time, we will      continue to keep this in mind and decide if there should be any      other approach.  4. Hypothyroidism, treated.  5. Good LV function.  6. Elevated triglycerides, treated.   Ms. Olivero is stable, and I will see her for cardiology followup in one  year.     Luis Abed, MD, [facility]  Electronically Signed    JDK/MedQ  DD: 06/17/2006  DT: 06/17/2006  Job #: [id number]   cc:   Gwen Pounds, MD

## 2010-10-27 NOTE — Procedures (Signed)
[facility]  Patient:    [name], [name] Visit Number: [id number] MRN: [id number]          Service Type: END Location: ENDO Attending Physician:  Nelda Marseille Dictated by:   Petra Kuba, M.D. Proc. Date: 08/15/01 Admit Date:  08/15/2001   CC:         Gwen Pounds, M.D.   Procedure Report  PROCEDURE:  Colonoscopy and polypectomy.  INDICATION:  Screening. Consent was signed after risk, benefits, methods, and options were thoroughly discussed in the office.  MEDICINES USED:  Demerol 60 mg, Versed 6 mg.  DESCRIPTION OF PROCEDURE:  Rectal inspection is pertinent for external hemorrhoids, small. Digital exam is negative. Video pediatric adjustable colonoscope was inserted, fairly easily advanced around the colon to the cecum. This did require some left lower quadrant pressure and turning her on her back. On insertion, a distal sigmoid polyp was seen. Also on insertion, some left-sided diverticula were seen. A small descending polyp was seen and was cold biopsied x 1 on insertion to mark the spot. No other abnormalities were seen as we advanced to the cecum which was identified by the appendiceal orifice and ileocecal valve. The scope was slowly withdrawn. The prep was adequate. There was some liquid stool that required washing and suctioning. In the ascending colon, a small polyp was seen and was hot biopsied x 2. The scope was further withdrawn. The left-sided diverticula were confirmed. The descending polyp cold biopsy was seen and hot biopsied as well x 1 and put with the other polyp in the ascending and the cold biopsy portion as well in container #1. No other abnormalities were seen other than the left-sided diverticula as we withdrew to the distal sigmoid. The sigmoid polyp was found, snare electrocautery applied and the polyp was suctioned onto the head of the scope after removal. This polyp was recovered and put in a second  container. The scope was reinserted to the polypectomy site. There was a nice white coagulum without any obvious residual and no missed tissue. The scope was further withdrawn back to the rectum to retroflex; pertinent for some internal hemorrhoids. The scope was straightened, advanced shortways up the sigmoid. Air was suctioned, scope removed. The patient tolerated the procedure well. There was no obvious immediate complication.  ENDOSCOPIC DIAGNOSES: 1. Internal/external hemorrhoids. 2. Left-sided diverticula. 3. Sigmoid 1-cm polyp, status post snare. 4. Two small polyps, one in the descending colon and hot biopsied; one in the    ascending, hot biopsied x 2. 5. Otherwise within normal limits of the cecum.  PLAN:  She will have followup p.r.n. with yearly rectals and guaiacs per Dr. Timothy Lasso. I will be happy to see her back but will await pathology to determine future colonic screening. Dictated by:   Petra Kuba, M.D. Attending Physician:  Nelda Marseille DD:  08/15/01 TD:  08/16/01 Job: [id number]

## 2010-11-23 ENCOUNTER — Encounter: Payer: Self-pay | Admitting: Cardiology

## 2010-11-23 ENCOUNTER — Ambulatory Visit (INDEPENDENT_AMBULATORY_CARE_PROVIDER_SITE_OTHER): Payer: Medicare Other | Admitting: Cardiology

## 2010-11-23 DIAGNOSIS — R05 Cough: Secondary | ICD-10-CM

## 2010-11-23 DIAGNOSIS — R110 Nausea: Secondary | ICD-10-CM | POA: Insufficient documentation

## 2010-11-23 DIAGNOSIS — R059 Cough, unspecified: Secondary | ICD-10-CM

## 2010-11-23 DIAGNOSIS — Z79899 Other long term (current) drug therapy: Secondary | ICD-10-CM

## 2010-11-23 DIAGNOSIS — Z5189 Encounter for other specified aftercare: Secondary | ICD-10-CM

## 2010-11-23 DIAGNOSIS — I4891 Unspecified atrial fibrillation: Secondary | ICD-10-CM

## 2010-11-23 LAB — CBC WITH DIFFERENTIAL/PLATELET
Eosinophils Relative: 7 % — ABNORMAL HIGH (ref 0.0–5.0)
HCT: 39.7 % (ref 36.0–46.0)
Hemoglobin: 13.6 g/dL (ref 12.0–15.0)
Lymphs Abs: 1.6 10*3/uL (ref 0.7–4.0)
MCV: 95.3 fl (ref 78.0–100.0)
Monocytes Absolute: 0.7 10*3/uL (ref 0.1–1.0)
Monocytes Relative: 11 % (ref 3.0–12.0)
Neutro Abs: 3.2 10*3/uL (ref 1.4–7.7)
RDW: 13 % (ref 11.5–14.6)
WBC: 6 10*3/uL (ref 4.5–10.5)

## 2010-11-23 NOTE — Patient Instructions (Signed)
Lab today (CBC)  Call Dr Jone Baseman office and make an appt for as soon as you return  Your physician recommends that you schedule a follow-up appointment in: 3 months

## 2010-11-23 NOTE — Assessment & Plan Note (Signed)
I am very concerned about this symptom.  I have asked her to see her primary physician soon.  She is on anticoagulant for her atrial fib.  CBC will be checked today.

## 2010-11-23 NOTE — Progress Notes (Signed)
HPI Patient is seen for followup of atrial fibrillation.  I saw her last September 06, 2010.  She had a cough at that time.  Chest x-Facemire showed no marked abnormality.  She has been treated nicely by her primary physician and she is feeling better.  She has not had any significant palpitations.  She is on Rythmol.  The patient mentions to me that sometimes she has some mild nausea several hours after eating.  This feels better if she has something to eat.  She does not describe any other signs or symptoms of peptic ulcer disease.  This would be a significant concern as she is on an anticoagulant Allergies  Allergen Reactions  . Lisinopril     REACTION: Cough  . Penicillins     REACTION: Swelling, Rash    Current Outpatient Prescriptions  Medication Sig Dispense Refill  . Calcium Carbonate-Vit D-Min (CALCIUM 600+D PLUS MINERALS) 600-400 MG-UNIT TABS Take 1 tablet by mouth daily.        . Cholecalciferol (VITAMIN D) 400 UNITS capsule Take 400 Units by mouth daily.        . dabigatran (PRADAXA) 150 MG CAPS Take 150 mg by mouth every 12 (twelve) hours.        . diclofenac sodium (VOLTAREN) 1 % GEL Apply topically 2 (two) times daily.       Marland Kitchen diltiazem (CARDIZEM CD) 240 MG 24 hr capsule Take 240 mg by mouth daily.        . hydrochlorothiazide 25 MG tablet Take 25 mg by mouth daily.        Marland Kitchen levothyroxine (LEVOXYL) 150 MCG tablet Take 150 mcg by mouth daily.        . metoprolol succinate (TOPROL-XL) 25 MG 24 hr tablet Take 25 mg by mouth daily.        . Multiple Vitamins-Minerals (OCUVITE PO) 1 tab po qd       . niacin (NIASPAN) 500 MG CR tablet Take 500 mg by mouth at bedtime.        . Omega-3 Fatty Acids (FISH OIL EXTRA STRENGTH PO) 1 tab po qd       . pravastatin (PRAVACHOL) 20 MG tablet Take 20 mg by mouth daily.        . propafenone (RYTHMOL) 225 MG tablet Take 225 mg by mouth 2 (two) times daily.          History   Social History  . Marital Status: Widowed    Spouse Name: N/A    Number  of Children: N/A  . Years of Education: N/A   Occupational History  . Not on file.   Social History Main Topics  . Smoking status: Former Smoker    Quit date: 09/06/1990  . Smokeless tobacco: Not on file  . Alcohol Use: No  . Drug Use: No  . Sexually Active: Not on file   Other Topics Concern  . Not on file   Social History Narrative  . No narrative on file    Family History  Problem Relation Age of Onset  . Coronary artery disease      Past Medical History  Diagnosis Date  . Brady-tachy syndrome     Pacemaker   Dr.Allred  . Hypothyroidism   . Atrial fibrillation     Episodes rapid atrial fib noted by pacemaker interrogation, August, 2011, diltiazem added, patient improved  . History of hysterectomy   . Drug therapy     Rythmol, atrial fibrillation  . Drug  therapy     Pradaxa  (Switch from Coumadin-patient request-March, 2011)  . Pacemaker     Brady tachycardia syndrome  . Cough     Chronic--an ACE inhibitor component  . Hair loss     Patient questioned Coumadin, changed toPradaxa  . Atrial fibrillation     EF 70%, echo, October, 2009, vigorous LV function  /  nuclear November, 2009, no ischemia, shifting breast attenuation  . Cough     March, 2012    Past Surgical History  Procedure Date  . Pacemaker placement 2009    PPM-Biotronik    ROS  Patient denies fever, chills, headache, sweats, rash, change in vision, change in hearing, chest pain, Urinary symptoms.  All other systems are reviewed and are negative.  PHYSICAL EXAM Patient is oriented to person time and place.  Affect is normal.  Head is atraumatic.  Lungs are clear.  Respiratory effort is nonlabored.  There is no jugular venous distention.  Cardiac exam reveals an S1-S2.  The rhythm is regular.  Abdomen is soft.  There is no peripheral edema.  There are no musculoskeletal deformities.  No skin rashes. Filed Vitals:   11/23/10 1400  BP: 126/65  Pulse: 66  Resp: 20  Height: 5\' 3"  (1.6 m)    Weight: 197 lb (89.359 kg)    EKG Is done today.  There is atrial pacing with normal conduction to a narrow QRS.  ASSESSMENT & PLAN

## 2010-11-23 NOTE — Assessment & Plan Note (Signed)
Her cough is improved with the therapy that she has received from her primary physician.  No further workup

## 2010-11-23 NOTE — Assessment & Plan Note (Signed)
The patient's rhythm is stable.  She needs no further workup at this time.  She is on an anticoagulant.

## 2010-11-23 NOTE — Assessment & Plan Note (Signed)
The patient does well with Rythmol.  No change.

## 2010-11-23 NOTE — Assessment & Plan Note (Signed)
The patient is not Pradaxa.  She has been stable with this.  She is getting some symptoms that may represent dyspepsia.  I've urged her to see her primary physician soon.  We will also check a CBC today.

## 2010-12-12 ENCOUNTER — Telehealth: Payer: Self-pay | Admitting: Cardiology

## 2010-12-12 NOTE — Telephone Encounter (Signed)
Ok w/Pradaxa per Dr Excell Seltzer, left voice mail for pt ok to take

## 2010-12-12 NOTE — Telephone Encounter (Signed)
Pt calling to see if ok to take metronidazole 500 mg with her plavix

## 2010-12-29 ENCOUNTER — Telehealth: Payer: Self-pay | Admitting: Cardiology

## 2010-12-29 NOTE — Telephone Encounter (Signed)
On 10.12.12 pt is having knee replacement. Pt will fax sur clearance form.

## 2010-12-29 NOTE — Telephone Encounter (Signed)
Pt just wanted to give [place] a heads up on her upcoming surgery she states Dr Despina Hick is going to fax a clearance form to [place]

## 2011-01-22 ENCOUNTER — Encounter: Payer: Self-pay | Admitting: Internal Medicine

## 2011-01-22 ENCOUNTER — Ambulatory Visit (INDEPENDENT_AMBULATORY_CARE_PROVIDER_SITE_OTHER): Payer: Medicare Other | Admitting: Internal Medicine

## 2011-01-22 DIAGNOSIS — I495 Sick sinus syndrome: Secondary | ICD-10-CM

## 2011-01-22 DIAGNOSIS — I4891 Unspecified atrial fibrillation: Secondary | ICD-10-CM

## 2011-01-22 DIAGNOSIS — Z950 Presence of cardiac pacemaker: Secondary | ICD-10-CM

## 2011-01-22 LAB — PACEMAKER DEVICE OBSERVATION
AL IMPEDENCE PM: 593 Ohm
ATRIAL PACING PM: 78
BATTERY VOLTAGE: 2.76 V
RV LEAD IMPEDENCE PM: 982 Ohm

## 2011-01-22 MED ORDER — PROPAFENONE HCL 225 MG PO TABS: 225.0000 mg | ORAL_TABLET | Freq: Two times a day (BID) | ORAL | Status: DC

## 2011-01-22 NOTE — Patient Instructions (Signed)
Your physician wants you to follow-up in: 6 months with the device clinic. You will receive a reminder letter in the mail two months in advance. If you don't receive a letter, please call our office to schedule the follow-up appointment.  

## 2011-01-22 NOTE — Assessment & Plan Note (Signed)
Stable Check BMET and CBC on pradaxa.

## 2011-01-22 NOTE — Progress Notes (Signed)
The patient presents today for routine electrophysiology followup.  Since last being seen in our clinic, the patient reports doing very well.  Her afib is stable.  Her rates have improved with diltiazem.  Today, she denies symptoms of palpitations, chest pain, shortness of breath, orthopnea, PND, lower extremity edema, dizziness, presyncope, syncope, or neurologic sequela.  The patient feels that she is tolerating medications without difficulties and is otherwise without complaint today.   Past Medical History  Diagnosis Date  . Brady-tachy syndrome     Pacemaker   Dr.Jossie Smoot  . Hypothyroidism   . Atrial fibrillation   . History of hysterectomy   . Drug therapy     Rythmol, atrial fibrillation  . Drug therapy     Pradaxa  (Switch from Coumadin-patient request-March, 2011)  . Pacemaker     Brady tachycardia syndrome  . Cough     Chronic--an ACE inhibitor component  . Hair loss     Patient questioned Coumadin, changed toPradaxa  . Atrial fibrillation     EF 70%, echo, October, 2009, vigorous LV function  /  nuclear November, 2009, no ischemia, shifting breast attenuation  . Cough     March, 2012  . Nausea     Nausea that occurs 2 or 3 hours after eating that is improved with eating a small snack   Past Surgical History  Procedure Date  . Pacemaker placement 2009    PPM-Biotronik    Current Outpatient Prescriptions  Medication Sig Dispense Refill  . Calcium Carbonate-Vit D-Min (CALCIUM 600+D PLUS MINERALS) 600-400 MG-UNIT TABS Take 1 tablet by mouth daily.        . Cholecalciferol (VITAMIN D) 400 UNITS capsule Take 400 Units by mouth daily.        . dabigatran (PRADAXA) 150 MG CAPS Take 150 mg by mouth every 12 (twelve) hours.        . diclofenac sodium (VOLTAREN) 1 % GEL Apply topically 2 (two) times daily.       Marland Kitchen diltiazem (CARDIZEM CD) 240 MG 24 hr capsule Take 240 mg by mouth daily.        . hydrochlorothiazide 25 MG tablet Take 25 mg by mouth daily.        Marland Kitchen levothyroxine  (LEVOXYL) 150 MCG tablet Take 150 mcg by mouth daily.        . metoprolol succinate (TOPROL-XL) 25 MG 24 hr tablet Take 25 mg by mouth daily.        . Multiple Vitamins-Minerals (OCUVITE PO) 1 tab po qd       . niacin (NIASPAN) 500 MG CR tablet Take 500 mg by mouth at bedtime.        . Omega-3 Fatty Acids (FISH OIL EXTRA STRENGTH PO) 1 tab po qd       . pravastatin (PRAVACHOL) 20 MG tablet Take 20 mg by mouth daily.        . propafenone (RYTHMOL) 225 MG tablet Take 1 tablet (225 mg total) by mouth 2 (two) times daily.  60 tablet  11    Allergies  Allergen Reactions  . Lisinopril     REACTION: Cough  . Penicillins     REACTION: Swelling, Rash    History   Social History  . Marital Status: Widowed    Spouse Name: N/A    Number of Children: N/A  . Years of Education: N/A   Occupational History  . Not on file.   Social History Main Topics  . Smoking status: Former  Smoker    Quit date: 09/06/1990  . Smokeless tobacco: Not on file  . Alcohol Use: No  . Drug Use: No  . Sexually Active: Not on file   Other Topics Concern  . Not on file   Social History Narrative  . No narrative on file    Family History  Problem Relation Age of Onset  . Coronary artery disease     Physical Exam: Filed Vitals:   01/22/11 1115  BP: 110/59  Pulse: 65  Resp: 12  Height: 5\' 3"  (1.6 m)  Weight: 198 lb (89.812 kg)    GEN- The patient is well appearing, alert and oriented x 3 today.   Head- normocephalic, atraumatic Eyes-  Sclera clear, conjunctiva pink Ears- hearing intact Oropharynx- clear Neck- supple, no JVP Lymph- no cervical lymphadenopathy Lungs- Clear to ausculation bilaterally, normal work of breathing Chest- pacemaker pocket is well healed Heart- Regular rate and rhythm, no murmurs, rubs or gallops, PMI not laterally displaced GI- soft, NT, ND, + BS Extremities- no clubbing, cyanosis, or edema Neuro- strength and sensation are intact  Pacemaker interrogation- reviewed  in detail today,  See PACEART report  Assessment and Plan:

## 2011-01-22 NOTE — Assessment & Plan Note (Signed)
Normal pacemaker function See Pace Art report No changes today  

## 2011-01-31 ENCOUNTER — Telehealth: Payer: Self-pay | Admitting: Cardiology

## 2011-01-31 MED ORDER — METOPROLOL SUCCINATE ER 25 MG PO TB24: 25.0000 mg | ORAL_TABLET | Freq: Every day | ORAL | Status: DC

## 2011-01-31 NOTE — Telephone Encounter (Signed)
Toprol refilled.

## 2011-01-31 NOTE — Telephone Encounter (Signed)
Pt needs written rx for metoprolol 25 mg and valtaren gel, lives in [place] and will be in the area around lunch time if possible that it can be ready by then, pt [phone]

## 2011-02-19 ENCOUNTER — Telehealth: Payer: Self-pay | Admitting: Cardiology

## 2011-02-19 NOTE — Telephone Encounter (Signed)
Pt calling regarding pt knee replacement. Pt needs to get surgical clearance prior to surgery. Please return pt call to discuss further.

## 2011-02-19 NOTE — Telephone Encounter (Signed)
Pt is scheduled for a knee replacement on March 23, 2011.  She is needing a surgery clearance and has an appt with Dr Myrtis Ser 02/28/11 and is wondering if any testing is needed prior to the appt.

## 2011-02-22 NOTE — Telephone Encounter (Signed)
Pt was notified that no testing is needed in advance of her appt.

## 2011-02-22 NOTE — Telephone Encounter (Signed)
Pt rtn call to debbie re surgical clearance, pls call [phone]

## 2011-02-27 ENCOUNTER — Encounter: Payer: Self-pay | Admitting: Cardiology

## 2011-02-27 DIAGNOSIS — IMO0001 Reserved for inherently not codable concepts without codable children: Secondary | ICD-10-CM | POA: Insufficient documentation

## 2011-02-27 DIAGNOSIS — R9430 Abnormal result of cardiovascular function study, unspecified: Secondary | ICD-10-CM | POA: Insufficient documentation

## 2011-02-27 DIAGNOSIS — IMO0002 Reserved for concepts with insufficient information to code with codable children: Secondary | ICD-10-CM | POA: Insufficient documentation

## 2011-02-28 ENCOUNTER — Encounter: Payer: Self-pay | Admitting: Cardiology

## 2011-02-28 ENCOUNTER — Ambulatory Visit (INDEPENDENT_AMBULATORY_CARE_PROVIDER_SITE_OTHER): Payer: Medicare Other | Admitting: Cardiology

## 2011-02-28 DIAGNOSIS — Z5189 Encounter for other specified aftercare: Secondary | ICD-10-CM

## 2011-02-28 DIAGNOSIS — Z01810 Encounter for preprocedural cardiovascular examination: Secondary | ICD-10-CM

## 2011-02-28 DIAGNOSIS — I495 Sick sinus syndrome: Secondary | ICD-10-CM

## 2011-02-28 DIAGNOSIS — Z79899 Other long term (current) drug therapy: Secondary | ICD-10-CM

## 2011-02-28 NOTE — Assessment & Plan Note (Signed)
The patient is stable on Rythmol.  No change in therapy.

## 2011-02-28 NOTE — Assessment & Plan Note (Signed)
The patient's Pradaxa Will have to be held for several days before her knee surgery.  This will be safe from the cardiac viewpoint.  Her anticoagulation can be restarted carefully after her surgery.

## 2011-02-28 NOTE — Assessment & Plan Note (Signed)
The patient's cardiac status is stable for knee surgery.  Her Pradaxa will have to be held for several days and then anticoagulation will have to be started after her surgery

## 2011-02-28 NOTE — Assessment & Plan Note (Signed)
The patient has not had any marked symptoms of arrhythmias.  No further workup.

## 2011-02-28 NOTE — Progress Notes (Signed)
HPI Patient is seen to followup atrial fibrillation.  I've seen her last on November 23, 2010.  Her overall cardiac status was stable.  However I was concerned about her nausea.  She has seen her primary physician and been treated in the meantime.  Her cardiac status is stable.  She needs to be cleared for knee surgery. Allergies  Allergen Reactions  . Lisinopril     REACTION: Cough  . Penicillins     REACTION: Swelling, Rash    Current Outpatient Prescriptions  Medication Sig Dispense Refill  . Calcium Carbonate-Vit D-Min (CALCIUM 600+D PLUS MINERALS) 600-400 MG-UNIT TABS Take 1 tablet by mouth daily.        . Cholecalciferol (VITAMIN D) 400 UNITS capsule Take 400 Units by mouth daily.        . dabigatran (PRADAXA) 150 MG CAPS Take 150 mg by mouth every 12 (twelve) hours.        . diclofenac sodium (VOLTAREN) 1 % GEL Apply topically 2 (two) times daily.       Marland Kitchen diltiazem (CARDIZEM CD) 240 MG 24 hr capsule Take 240 mg by mouth daily.        . hydrochlorothiazide 25 MG tablet Take 25 mg by mouth daily.        Marland Kitchen levothyroxine (LEVOXYL) 150 MCG tablet Take 150 mcg by mouth daily.        . metoprolol succinate (TOPROL-XL) 25 MG 24 hr tablet Take 1 tablet (25 mg total) by mouth daily.  30 tablet  6  . Multiple Vitamins-Minerals (OCUVITE PO) 1 tab po qd       . niacin (NIASPAN) 500 MG CR tablet Take 500 mg by mouth at bedtime.        . Omega-3 Fatty Acids (FISH OIL EXTRA STRENGTH PO) 1 tab po qd       . pravastatin (PRAVACHOL) 20 MG tablet Take 20 mg by mouth daily.        . propafenone (RYTHMOL) 225 MG tablet Take 1 tablet (225 mg total) by mouth 2 (two) times daily.  60 tablet  11    History   Social History  . Marital Status: Widowed    Spouse Name: N/A    Number of Children: N/A  . Years of Education: N/A   Occupational History  . Not on file.   Social History Main Topics  . Smoking status: Former Smoker    Quit date: 09/06/1990  . Smokeless tobacco: Not on file  . Alcohol Use: No   . Drug Use: No  . Sexually Active: Not on file   Other Topics Concern  . Not on file   Social History Narrative  . No narrative on file    Family History  Problem Relation Age of Onset  . Coronary artery disease      Past Medical History  Diagnosis Date  . Brady-tachy syndrome     Pacemaker   Dr.Allred  . Hypothyroidism   . Atrial fibrillation   . History of hysterectomy   . Drug therapy     Rythmol, atrial fibrillation  . Drug therapy     Pradaxa  (Switch from Coumadin-patient request-March, 2011)  . Pacemaker     Brady tachycardia syndrome  . Cough     Chronic--an ACE inhibitor component  . Hair loss     Patient questioned Coumadin, changed toPradaxa  . Atrial fibrillation     EF 70%, echo, October, 2009, vigorous LV function  /  nuclear November,  2009, no ischemia, shifting breast attenuation  . Cough     March, 2012  . Nausea     Nausea that occurs 2 or 3 hours after eating that is improved with eating a small snack  . Ejection fraction     EF 70%, echo, October, 2009  . Normal nuclear stress test     Nuclear, November, 2009, breast attenuation  . Preop cardiovascular exam     Clearance for her right knee surgery October, 2012    Past Surgical History  Procedure Date  . Pacemaker placement 2009    PPM-Biotronik    ROS  Patient denies fever, chills, headache, sweats, rash,Change in vision, change in hearing, chest pain, cough, or urinary symptoms.  All other systems are reviewed and are negative. PHYSICAL EXAM Patient is stable.  She is oriented to person time and place.  Affect is normal.  Head is atraumatic.  There is no jugulovenous distention.  Lungs are clear.  Respiratory effort is nonlabored.  Cardiac exam reveals an S1-S2.  No clicks or significant murmurs.  The abdomen is soft.  No peripheral edema. Filed Vitals:   02/28/11 1114  BP: 132/70  Pulse: 84  Height: 5' 3.5" (1.613 m)  Weight: 199 lb (90.266 kg)    EKGEKG is not done  today.  ASSESSMENT & PLAN

## 2011-02-28 NOTE — Patient Instructions (Signed)
Your physician recommends that you schedule a follow-up appointment in: 6 months with Dr. Katz  

## 2011-03-12 LAB — CBC
HCT: 43.6
Hemoglobin: 15.1 — ABNORMAL HIGH
MCV: 94.1
RBC: 4.64
WBC: 6.2

## 2011-03-12 LAB — COMPREHENSIVE METABOLIC PANEL
AST: 27
Albumin: 4
Alkaline Phosphatase: 42
Chloride: 105
Creatinine, Ser: 0.93
GFR calc Af Amer: >60
Potassium: 4.1
Total Bilirubin: 0.8
Total Protein: 6.8

## 2011-03-12 LAB — TSH: TSH: 0.844

## 2011-03-12 LAB — CARDIAC PANEL(CRET KIN+CKTOT+MB+TROPI)
Relative Index: 2.4
Relative Index: 2.8 — ABNORMAL HIGH
Troponin I: 0.03

## 2011-03-12 LAB — POCT CARDIAC MARKERS
CKMB, poc: 3.6
Troponin i, poc: <0.05

## 2011-03-12 LAB — CK TOTAL AND CKMB (NOT AT ARMC): Total CK: 195 — ABNORMAL HIGH

## 2011-03-13 LAB — PROTIME-INR
INR: 2.1 — ABNORMAL HIGH
Prothrombin Time: 25 — ABNORMAL HIGH

## 2011-03-20 ENCOUNTER — Encounter (HOSPITAL_COMMUNITY): Payer: Medicare Other

## 2011-03-20 ENCOUNTER — Other Ambulatory Visit: Payer: Self-pay | Admitting: Orthopedic Surgery

## 2011-03-20 LAB — CBC
MCV: 93.7 fL (ref 78.0–100.0)
Platelets: 193 10*3/uL (ref 150–400)
RBC: 4.6 MIL/uL (ref 3.87–5.11)
RDW: 12.7 % (ref 11.5–15.5)
WBC: 6 10*3/uL (ref 4.0–10.5)

## 2011-03-20 LAB — COMPREHENSIVE METABOLIC PANEL
ALT: 53 U/L — ABNORMAL HIGH (ref 0–35)
BUN: 17 mg/dL (ref 6–23)
CO2: 29 mEq/L (ref 19–32)
Calcium: 9.8 mg/dL (ref 8.4–10.5)
GFR calc Af Amer: 77 mL/min — ABNORMAL LOW (ref >90)
GFR calc non Af Amer: 66 mL/min — ABNORMAL LOW (ref >90)
Glucose, Bld: 88 mg/dL (ref 70–99)
Total Protein: 7.2 g/dL (ref 6.0–8.3)

## 2011-03-20 LAB — URINALYSIS, ROUTINE W REFLEX MICROSCOPIC
Bilirubin Urine: NEGATIVE
Ketones, ur: NEGATIVE mg/dL
Nitrite: NEGATIVE
Protein, ur: NEGATIVE mg/dL
Urobilinogen, UA: 0.2 mg/dL (ref 0.0–1.0)

## 2011-03-20 LAB — ABO/RH: ABO/RH(D): A POS

## 2011-03-20 LAB — PROTIME-INR
INR: 0.97 (ref 0.00–1.49)
Prothrombin Time: 13.1 seconds (ref 11.6–15.2)

## 2011-03-20 LAB — SURGICAL PCR SCREEN: MRSA, PCR: NEGATIVE

## 2011-03-20 LAB — APTT: aPTT: 31 seconds (ref 24–37)

## 2011-03-23 ENCOUNTER — Inpatient Hospital Stay (HOSPITAL_COMMUNITY)
Admission: RE | Admit: 2011-03-23 | Discharge: 2011-03-26 | DRG: 470 | Disposition: A | Discharge status: Skilled Nursing Facility | Payer: Medicare Other | Source: Ambulatory Visit | Attending: Orthopedic Surgery | Admitting: Orthopedic Surgery

## 2011-03-23 DIAGNOSIS — E876 Hypokalemia: Secondary | ICD-10-CM | POA: Diagnosis not present

## 2011-03-23 DIAGNOSIS — E7800 Pure hypercholesterolemia, unspecified: Secondary | ICD-10-CM | POA: Diagnosis present

## 2011-03-23 DIAGNOSIS — F29 Unspecified psychosis not due to a substance or known physiological condition: Secondary | ICD-10-CM | POA: Diagnosis not present

## 2011-03-23 DIAGNOSIS — Z01812 Encounter for preprocedural laboratory examination: Secondary | ICD-10-CM

## 2011-03-23 DIAGNOSIS — H539 Unspecified visual disturbance: Secondary | ICD-10-CM | POA: Diagnosis present

## 2011-03-23 DIAGNOSIS — T382X5A Adverse effect of antithyroid drugs, initial encounter: Secondary | ICD-10-CM | POA: Diagnosis present

## 2011-03-23 DIAGNOSIS — E018 Other iodine-deficiency related thyroid disorders and allied conditions: Secondary | ICD-10-CM | POA: Diagnosis present

## 2011-03-23 DIAGNOSIS — Z780 Asymptomatic menopausal state: Secondary | ICD-10-CM

## 2011-03-23 DIAGNOSIS — Z87440 Personal history of urinary (tract) infections: Secondary | ICD-10-CM

## 2011-03-23 DIAGNOSIS — Z950 Presence of cardiac pacemaker: Secondary | ICD-10-CM

## 2011-03-23 DIAGNOSIS — I10 Essential (primary) hypertension: Secondary | ICD-10-CM | POA: Diagnosis present

## 2011-03-23 DIAGNOSIS — E871 Hypo-osmolality and hyponatremia: Secondary | ICD-10-CM | POA: Diagnosis not present

## 2011-03-23 DIAGNOSIS — L259 Unspecified contact dermatitis, unspecified cause: Secondary | ICD-10-CM | POA: Diagnosis present

## 2011-03-23 DIAGNOSIS — M1710 Unilateral primary osteoarthritis, unspecified knee: Principal | ICD-10-CM | POA: Diagnosis present

## 2011-03-23 DIAGNOSIS — I4891 Unspecified atrial fibrillation: Secondary | ICD-10-CM | POA: Diagnosis present

## 2011-03-23 DIAGNOSIS — K649 Unspecified hemorrhoids: Secondary | ICD-10-CM | POA: Diagnosis present

## 2011-03-23 LAB — TYPE AND SCREEN: ABO/RH(D): A POS

## 2011-03-23 NOTE — Op Note (Signed)
[name], [name]                  ACCOUNT NO.:  [id number]  MEDICAL RECORD NO.:  [id number]  LOCATION:  0002                         FACILITY:  [facility]  PHYSICIAN:  Ollen Gross, M.D.    DATE OF BIRTH:  [birth year]  DATE OF PROCEDURE:  03/23/2011 DATE OF DISCHARGE:                              OPERATIVE REPORT   PREOPERATIVE DIAGNOSIS:  Osteoarthritis, right knee.  POSTOPERATIVE DIAGNOSIS:  Osteoarthritis, right knee.  PROCEDURE:  Right total knee arthroplasty.  SURGEON:  Ollen Gross, M.D.  ASSISTANT:  Alexzandrew L. Perkins, P.A.C.  ANESTHESIA:  Spinal.  ESTIMATED BLOOD LOSS:  Minimal.  DRAINS:  Hemovac x1.  TOURNIQUET TIME:  34 minutes at 300 mmHg.  COMPLICATIONS:  None.  CONDITION:  Stable to recovery.  CLINICAL NOTE:  [name] is an 77 year old female with advanced end-stage arthritis of the right knee with progressively worsening pain and dysfunction.  She has bone-on-bone changes in the medial and patellofemoral compartments.  She has failed nonoperative management including injections and analgesics.  She presents now for right total knee arthroplasty.  PROCEDURE IN DETAIL:  After successful administration of spinal anesthetic, a tourniquet was placed high on her right thigh and her right lower extremity was prepped and draped in the usual sterile fashion.  Extremities wrapped in Esmarch, knee flexed, and tourniquet inflated to 300 mmHg.  Midline incision was made with a 10-blade through subcutaneous tissue to the level of the extensor mechanism.  A fresh blade was used to make a medial parapatellar arthrotomy.  Soft tissue on the proximal medial tibia was subperiosteally elevated to the joint line with the knife and into the semimembranosus bursa with a Cobb elevator. Soft tissue laterally was elevated with attention being paid to avoiding the patellar tendon on tibial tubercle.  The patella was everted, knee flexed 90 degrees, ACL and PCL removed.  Drill  was used to create a starting hole in the distal femur and the canal was thoroughly irrigated.  The 5-degree right valgus alignment guide was placed. Distal femoral cutting block was pinned to remove 10 mm off the distal femur.  Resection was made with an oscillating saw.  The tibia subluxed forward and the menisci removed.  The extramedullary tibial alignment guide was placed, referencing proximally at the medial aspect of the tibial tubercle and distally along the second metatarsal axis and tibial crest.  Block was pinned to remove 2-mm off the more deficient medial side.  Tibial resection was made with an oscillating saw.  Size 3 was the most appropriate tibial component.  The proximal tibia was then prepared to modular drill and keel punch for the size 3.  The femoral sizing guide was placed, size 4 narrow as most appropriate. Rotations marked at the epicondylar axis and confirmed by creating a rectangular flexion gap at 90 degrees.  The block was pinned in this rotation and the anterior, posterior, and chamfer cuts were made. Intercondylar block was placed and that cut was made.  Trial size 4 narrow posterior stabilized femur was placed.  A 10 mm posterior stabilized rotating platform insert trial was placed, with the 10 full extensions achieved with excellent varus-valgus and anterior-posterior balance throughout  full range of motion.  Patella was everted and the thickness measured to be 24-mm.  Freehand resection was taken to 14-mm, 35 template was placed, lug holes were drilled, trial patella was placed and it tracks normally.  Osteophytes removed off the posterior femur with the trial in place.  All trials were removed and the cut bone surfaces were prepared with pulsatile lavage.  Cement was mixed and once ready for implantation, the size 3 mobile bearing tibial tray, size 4 narrow posterior stabilized femur, and 35 patella were cemented into place.  The patella was held with  a clamp.  Trial 10-mm inserts were placed, knee held in full extension, and all extruded cement removed. When cement fully hardened, then the permanent 10-mm posterior stabilized rotating platform insert was placed into the tibial tray. The wound was copiously irrigated with saline solution and the arthrotomy closed over Hemovac drain with interrupted #1 PDS.  Flexion against gravity was about 135 degrees and patella tracks normally. Tourniquets released with total tourniquet time of 34 minutes.  Subcu was closed with interrupted 2-0 Vicryl and subcuticular running 4-0 Monocryl.  Catheter for Marcaine pain pump placed and pumps initiated. Incisions cleaned and dried and Steri-Strips and a bulky sterile dressing were applied.  She was then awakened and transported to recovery in stable condition.  Please note that the intraoperative findings including bone-on-bone changes in the medial and patellofemoral compartments.  Also please note that a surgical assistant was a medical necessity for this procedure in order to perform it in a safe and expeditious manner.  Surgical assistant was necessary for retraction of ligaments and vital neurovascular structures.  Surgical assistant was also necessary for proper positioning of the limb to allow for anatomic placement of the prosthesis.     Ollen Gross, M.D.     FA/MEDQ  D:  03/23/2011  T:  03/23/2011  Job:  [id number]  Electronically Signed by Ollen Gross M.D. on 03/23/2011 84:13:24 PM

## 2011-03-24 LAB — BASIC METABOLIC PANEL
BUN: 16 mg/dL (ref 6–23)
CO2: 28 mEq/L (ref 19–32)
Calcium: 8.6 mg/dL (ref 8.4–10.5)
Chloride: 99 mEq/L (ref 96–112)
Creatinine, Ser: 0.68 mg/dL (ref 0.50–1.10)

## 2011-03-24 LAB — CBC
HCT: 35.3 % — ABNORMAL LOW (ref 36.0–46.0)
MCH: 31.4 pg (ref 26.0–34.0)
MCV: 94.6 fL (ref 78.0–100.0)
Platelets: 163 10*3/uL (ref 150–400)
RBC: 3.73 MIL/uL — ABNORMAL LOW (ref 3.87–5.11)
RDW: 12.8 % (ref 11.5–15.5)
WBC: 8.8 10*3/uL (ref 4.0–10.5)

## 2011-03-25 LAB — CBC
HCT: 31.3 % — ABNORMAL LOW (ref 36.0–46.0)
MCHC: 34.2 g/dL (ref 30.0–36.0)
MCV: 92.9 fL (ref 78.0–100.0)
Platelets: 128 10*3/uL — ABNORMAL LOW (ref 150–400)
RDW: 12.5 % (ref 11.5–15.5)
WBC: 8.9 10*3/uL (ref 4.0–10.5)

## 2011-03-25 LAB — BASIC METABOLIC PANEL
BUN: 8 mg/dL (ref 6–23)
Calcium: 8.5 mg/dL (ref 8.4–10.5)
Creatinine, Ser: 0.68 mg/dL (ref 0.50–1.10)
GFR calc Af Amer: >90 mL/min (ref >90)
GFR calc non Af Amer: 80 mL/min — ABNORMAL LOW (ref >90)

## 2011-03-26 LAB — CBC
HCT: 29.1 % — ABNORMAL LOW (ref 36.0–46.0)
MCHC: 34.7 g/dL (ref 30.0–36.0)
Platelets: 125 10*3/uL — ABNORMAL LOW (ref 150–400)
RDW: 12.4 % (ref 11.5–15.5)
WBC: 9.2 10*3/uL (ref 4.0–10.5)

## 2011-04-04 NOTE — Discharge Summary (Addendum)
[name], [name]                  ACCOUNT NO.:  [id number]  MEDICAL RECORD NO.:  [id number]  LOCATION:  1614                         FACILITY:  [facility]  PHYSICIAN:  Ollen Gross, M.D.    DATE OF BIRTH:  10-12-1927  DATE OF ADMISSION:  03/23/2011 DATE OF DISCHARGE:  03/26/2011                        DISCHARGE SUMMARY - REFERRING   ADMITTING DIAGNOSES: 1. Osteoarthritis, right knee. 2. Impaired vision. 3. Hypertension. 4. Hypercholesterolemia. 5. Paroxysmal atrial fibrillation. 6. Hemorrhoids. 7. Tachybrady syndrome requiring pacemaker. 8. Hyperthyroidism post iodine treatment. 9. Currently hypothyroidism. 10.Postmenopausal. 11.Childhood illness, measles, mumps. 12.Eczema. 13.History of urinary tract infection.  DISCHARGE DIAGNOSES: 1. Osteoarthritis, right knee status post right total knee replacement     and arthroplasty. 2. Postoperative hyponatremia. 3. Postoperative hypokalemia. 4. Osteoarthritis, right knee. 5. Impaired vision. 6. Hypertension. 7. Hypercholesterolemia. 8. Paroxysmal atrial fibrillation. 9. Hemorrhoids. 10.Tachybrady syndrome requiring pacemaker. 11.Hyperthyroidism post iodine treatment. 12.Currently hypothyroidism. 13.Postmenopausal. 14.Childhood illness, measles, mumps. 15.Eczema. 16.History of urinary tract infection.  PROCEDURE:  On March 23, 2011, right total knee.  SURGEON:  Ollen Gross, M.D.  ASSISTANT:  Alexzandrew L. Perkins, P.A.C.  ANESTHESIA:  Spinal.  TOURNIQUET TIME:  34 minutes.  CONSULTATIONS:  None.  BRIEF HISTORY:  The patient is an 77 year old female with advanced arthritis to the right knee, progressive worsening pain and dysfunction, bone-on-bone changes medial and patellofemoral compartments, failed nonoperative management including injections and analgesics who now presents for total knee arthroplasty.  LABORATORY DATA:  Preoperative CBC showed hemoglobin of 14.3, hematocrit 43.1, white cell count 6.0,  and platelet 193.  PT and INR 13.1 and 0.97 with PTT of 31.  Chemistry panel on admission, total bilirubin low at 0.2, AST elevated at 61, and ALT elevated at 53.  Remaining Chem panel within normal limits.  Preoperative UA, large leukocytes, rare squamous,21 to 50 red cells treated preoperatively.  Blood group type A positive.  Nasal swab showed negative Staphylococcus aureus and negative for MRSA.  Serial CBCs were followed.  Hemoglobin dropped down to 14,13, 11.7 and then 10.7.  Last noted H and H 10.1 and 29.1.  Serial BMETs were positive for sodium had slightly dropped from 137 to 134 and potassium dropped from 4.2 to 3.2.  Chest x-Spike dated on September 06, 2010, no active lung disease, mild peribronchial thickening may indicate bronchitis.  EKG dated on June 24, 2010, electronic atrial pacer performed by Dr. Samuel Bouche.  HOSPITAL COURSE:  The patient was admitted to [facility] and taken to OR, underwent above stated procedure without complications and the patient tolerated the procedure well and later transferred to the recovery room orthopedic floor, given 24 hours postoperative IV antibiotics, started on IV and p.o. analgesics for pain control, actually had a pretty rough night on the evening of surgery and felt a little bit better on day #1 started therapy, started getting out of bed,allowed to be weightbearing as tolerated and they would not look into skilled facility, so social work involved.  Postoperative laboratories were followed, had a hemoglobin 11.7 on day #1 and 10.7 on day #2 and did pretty well.  She was getting up and walking short distances on day #1 and day #2 and by  the end of the weekend she is walking about 20 to 25 feet.  She had a little bit of low O2 saturation and otherwise no complaints.  She is doing well.  She was seen on round on day #3 as long as she do well with therapy and progress very well, she will be transferred over to a friend's home in  [place] where she is residing.  DISCHARGE PLAN:  Tentative transfer today on March 26, 2011, to friend's home in [place].  DISCHARGE DIAGNOSES:  Please see above.  DISCHARGE MEDICATIONS: 1. Her current medications at the time of transfer include Xarelto 10     mg p.o. daily for 3 weeks and then discontinue the Xarelto. 2. Colace 100 mg p.o. b.i.d. 3. Artificial tears OP daily for dry eyes. 4. Flonase nasal spray 2 sprays nasally daily. 5. Niacin 500 mg p.o. daily. 6. Pravastatin 20 mg daily. 7. Metoprolol XL 25 mg p.o. q.h.s. 8. Diltiazem 240 mg p.o. q.h.s. 9. Ocuvite daily. 10.Hydrochlorothiazide 25 mg daily. 11.Rythmol 225 mg twice a day. 12.Levothyroxine 150 mcg p.o. daily. 13.Tylenol 325 one or two every 4 to 6 hours as needed for mild pain,     temperature, or headaches. 14.Lax of choice. 15.Enema of choice. 16.Robaxin 500 mg p.o. q.6-8 h. p.r.n. spasm. 17.Restoril 15 to 30 mg p.o. q.h.s. p.r.n. sleep. 18.OxyIR 5 to 10 mg p.o. q.4 h. p.r.n. pain.  DIET:  Heart healthy diet.  ACTIVITY:  Weightbearing as tolerated, total knee protocol, PT and OT for gait training, ambulation, ADLs, range of motion, strength, and exercises.  Continue a total knee protocol.  FOLLOWUP:  The patient will follow up with Dr. Lequita Halt in the office 2 weeks from the date of surgery.  Please contact the office at [phone], help setup appointment and transfer the patient.  DISPOSITION:  Plan to go to a friend's home at [place].  CONDITION UPON DISCHARGE:  Pending at the time of dictation, but to be transferred if improved.     Alexzandrew L. Julien Girt, P.A.C.   ______________________________ Ollen Gross, M.D.    ALP/MEDQ  D:  03/26/2011  T:  03/26/2011  Job:  [id number]  cc:   Dr. Massie Maroon, MD [address] [place], [place] [address]  Electronically Signed by Patrica Duel P.A.C. on 03/31/2011 09:16:40 AM Electronically Signed by  Ollen Gross M.D. on 04/04/2011 11:25:57 AM Electronically Signed by Ollen Gross M.D. on 04/04/2011 11:33:45 AM

## 2011-04-05 ENCOUNTER — Telehealth: Payer: Self-pay | Admitting: Cardiology

## 2011-04-05 NOTE — Telephone Encounter (Signed)
Pt was ok'd to continue her pradaxa, but needs to verify dosage pls fax to [phone] att helene

## 2011-04-05 NOTE — Telephone Encounter (Signed)
Dose faxed.

## 2011-04-15 NOTE — H&P (Signed)
NAMEMANNIE, WINELAND NO.:  [id number]  MEDICAL RECORD NO.:  [id number]  LOCATION:                               FACILITY:  [facility]  PHYSICIAN:  Ollen Gross, M.D.    DATE OF BIRTH:  07/30/1927  DATE OF ADMISSION:  03/23/2011 DATE OF DISCHARGE:                             HISTORY & PHYSICAL   DATE OF ADMISSION:  March 23, 2011  CHIEF COMPLAINT:  Right knee pain.  HISTORY OF PRESENT ILLNESS:  The patient is an 77 year old female who has been seen by Dr. Lequita Halt for ongoing right knee pain.  She has had known significant progressive arthritis for quite some time now.  She has been treated conservatively in the past including injections and Synvisc; however, she has failed conservative management and injections. It is felt due to the arthritic bone-on-bone changes in the medial and patellofemoral compartment, she would benefit from undergoing surgical intervention.  Risks and benefits have been discussed.  She elected to proceed with surgery.  She has been seen by Dr. Myrtis Ser and also by Dr. Valentina Lucks and felt to be stable for the upcoming procedure.  She denies any ongoing contraindications such as infection or progressive neurological disease.  ALLERGIES:  PENICILLIN causes throat swelling and rash.  "MYCINS" cause tongue swelling and diarrhea.  CURRENT MEDICATIONS:  Pradaxa, Cardizem, hydrochlorothiazide, Levoxyl, metoprolol, Niaspan, pravastatin, Rythmol, calcium with vitamin D, multivitamin, fish oil.  PAST MEDICAL HISTORY: 1. Right hand carpal tunnel syndrome. 2. Impaired vision. 3. Cataracts. 4. Hypertension. 5. Hypercholesterolemia. 6. Tachybrady syndrome, requiring pacemaker. 7. History of paroxysmal atrial fibrillation. 8. External hemorrhoids. 9. History of hyperthyroidism with iodine treatment. 10.History of thyroid goiter, currently hypothyroidism post iodine     treatment. 11.Urinary incontinence. 12.History of urinary tract  infections. 13.Hyperglycemia. 14.Osteopenia/osteoporosis postmenopausal.  CHILDHOOD ILLNESSES:  Measles, mumps, and also eczema.  PREVIOUS SURGERY: 1. Hysterectomy. 2. Right cataracts. 3. Rectocele repair. 4. Pacemaker placement.  FAMILY HISTORY:  Father deceased in his 7s with heart disease and stroke.  Mother deceased, age 34 with depression.  SOCIAL HISTORY:  Widowed retired.  Past smoker.  No alcohol.  She lives over at [facility] and wants to go to the Skilled Facility at [facility].  She does have a living will, healthcare power of attorney.  REVIEW OF SYSTEMS:  GENERAL:  No fevers, chills, or night sweats. NEUROLOGIC:  No seizure, syncope, paralysis.  RESPIRATORY:  No shortness of breath, productive cough, or hemoptysis.  Occasional dry cough. CARDIOVASCULAR:  No chest pain.  No orthopnea.  GI:  No nausea, vomiting, diarrhea, or constipation.  GU:  Little bit of urinary incontinence, nocturia.  No dysuria, hematuria.  MUSCULOSKELETAL:  Knee pain.  PHYSICAL EXAMINATION:  VITAL SIGNS:  Pulse 68, respirations 12, blood pressure 132/60. GENERAL:  An 77 year old female, well nourished, well developed, slightly overweight, no acute distress.  Alert and cooperative, pleasant. HEENT:  Normocephalic, atraumatic.  Pupils are round, reactive.  EOMs intact.  Noted to have contacts.  Does have upper and lower denture plates. NECK:  Supple. CHEST:  Clear. HEART:  Regular rate and rhythm with a faint early systolic ejection murmur. ABDOMEN:  Soft,  round.  Bowel sounds present. RECTAL, BREASTS, GENITALIA:  Not done, not part of present illness. EXTREMITIES:  Right knee, no effusion.  Range of motion 5 to 125, marked crepitus.  Tender over medial and lateral.  No instability.  IMPRESSION:  Osteoarthritis, right knee.  PLAN:  The patient admitted to [facility] to undergo right total knee replacement and arthroplasty.  Surgery will be performed by Dr.  Ollen Gross.  She wants to go to the Skilled Unit over at [facility], and we would consider [facility] if [facility] is full.     Alexzandrew L. Julien Girt, P.A.C.   ______________________________ Ollen Gross, M.D.    ALP/MEDQ  D:  04/12/2011  T:  04/12/2011  Job:  [id number]  cc:   Thora Lance, M.D. Fax: [phone]  Luis Abed, MD, [facility] [address] [place] [address]  Electronically Signed by Patrica Duel P.A.C. on 04/14/2011 09:27:54 AM Electronically Signed by Ollen Gross M.D. on 04/15/2011 08:39:50 PM

## 2011-05-16 ENCOUNTER — Telehealth: Payer: Self-pay | Admitting: Cardiology

## 2011-05-16 NOTE — Telephone Encounter (Signed)
Pt needs HZTZ [facility]days [facility] [address], written rx needed for 90 days with three refills, as well as Diltiazem and Pradaxa

## 2011-05-17 MED ORDER — DABIGATRAN ETEXILATE MESYLATE 150 MG PO CAPS: 150.0000 mg | ORAL_CAPSULE | Freq: Two times a day (BID) | ORAL | Status: DC

## 2011-05-17 MED ORDER — HYDROCHLOROTHIAZIDE 25 MG PO TABS: 25.0000 mg | ORAL_TABLET | Freq: Every day | ORAL | Status: DC

## 2011-05-17 MED ORDER — DILTIAZEM HCL ER COATED BEADS 240 MG PO CP24: 240.0000 mg | ORAL_CAPSULE | Freq: Every day | ORAL | Status: DC

## 2011-05-18 ENCOUNTER — Telehealth: Payer: Self-pay | Admitting: Cardiology

## 2011-05-18 DIAGNOSIS — I4891 Unspecified atrial fibrillation: Secondary | ICD-10-CM

## 2011-05-18 NOTE — Telephone Encounter (Signed)
Walk in pt Form " Pt needs to Discuss Prescriptions" sent to Message nurse 05/18/11/km

## 2011-05-18 NOTE — Telephone Encounter (Signed)
Pt's written rx's were called into the pharmacy, pls call into [facility] by mail, instead of written rx

## 2011-05-22 MED ORDER — PROPAFENONE HCL 225 MG PO TABS: 225.0000 mg | ORAL_TABLET | Freq: Two times a day (BID) | ORAL | Status: DC

## 2011-05-22 MED ORDER — HYDROCHLOROTHIAZIDE 25 MG PO TABS: 25.0000 mg | ORAL_TABLET | Freq: Every day | ORAL | Status: DC

## 2011-05-22 MED ORDER — METOPROLOL SUCCINATE ER 25 MG PO TB24: 25.0000 mg | ORAL_TABLET | Freq: Every day | ORAL | Status: DC

## 2011-05-22 MED ORDER — DILTIAZEM HCL ER COATED BEADS 240 MG PO CP24: 240.0000 mg | ORAL_CAPSULE | Freq: Every day | ORAL | Status: DC

## 2011-05-22 MED ORDER — DABIGATRAN ETEXILATE MESYLATE 150 MG PO CAPS: 150.0000 mg | ORAL_CAPSULE | Freq: Two times a day (BID) | ORAL | Status: DC

## 2011-05-22 MED ORDER — PRAVASTATIN SODIUM 20 MG PO TABS: 20.0000 mg | ORAL_TABLET | Freq: Every day | ORAL | Status: DC

## 2011-05-22 NOTE — Telephone Encounter (Signed)
Will call her and let her know when it is done so she can pick them up

## 2011-05-28 NOTE — Telephone Encounter (Signed)
These were signed by the DOD and left at the front desk, pt is aware

## 2011-07-03 ENCOUNTER — Other Ambulatory Visit: Payer: Self-pay | Admitting: Dermatology

## 2011-07-03 DIAGNOSIS — C44319 Basal cell carcinoma of skin of other parts of face: Secondary | ICD-10-CM | POA: Diagnosis not present

## 2011-08-14 DIAGNOSIS — Z85828 Personal history of other malignant neoplasm of skin: Secondary | ICD-10-CM | POA: Diagnosis not present

## 2011-08-14 DIAGNOSIS — D485 Neoplasm of uncertain behavior of skin: Secondary | ICD-10-CM | POA: Diagnosis not present

## 2011-08-22 ENCOUNTER — Ambulatory Visit (INDEPENDENT_AMBULATORY_CARE_PROVIDER_SITE_OTHER): Payer: Medicare Other | Admitting: Family Medicine

## 2011-08-22 VITALS — BP 146/77 | HR 80 | Temp 98.4°F | Resp 16 | Ht 64.0 in | Wt 194.0 lb

## 2011-08-22 DIAGNOSIS — N390 Urinary tract infection, site not specified: Secondary | ICD-10-CM

## 2011-08-22 DIAGNOSIS — R300 Dysuria: Secondary | ICD-10-CM | POA: Diagnosis not present

## 2011-08-22 LAB — POCT URINALYSIS DIPSTICK
Bilirubin, UA: NEGATIVE
pH, UA: 5.5

## 2011-08-22 LAB — POCT UA - MICROSCOPIC ONLY
Bacteria, U Microscopic: NEGATIVE
Casts, Ur, LPF, POC: NEGATIVE
Mucus, UA: NEGATIVE

## 2011-08-22 MED ORDER — CIPROFLOXACIN HCL 500 MG PO TABS: 500.0000 mg | ORAL_TABLET | Freq: Two times a day (BID) | ORAL | Status: AC

## 2011-08-22 MED ORDER — ONDANSETRON 8 MG PO TBDP: 8.0000 mg | ORAL_TABLET | Freq: Three times a day (TID) | ORAL | Status: AC | PRN

## 2011-08-22 NOTE — Patient Instructions (Signed)
Nausea and Vomiting  Nausea is a sick feeling that often comes before throwing up (vomiting). Vomiting is a reflex where stomach contents come out of your mouth. Vomiting can cause severe loss of body fluids (dehydration). Children and elderly adults can become dehydrated quickly, especially if they also have diarrhea. Nausea and vomiting are symptoms of a condition or disease. It is important to find the cause of your symptoms.  CAUSES    Direct irritation of the stomach lining. This irritation can result from increased acid production (gastroesophageal reflux disease), infection, food poisoning, taking certain medicines (such as nonsteroidal anti-inflammatory drugs), alcohol use, or tobacco use.   Signals from the brain.These signals could be caused by a headache, heat exposure, an inner ear disturbance, increased pressure in the brain from injury, infection, a tumor, or a concussion, pain, emotional stimulus, or metabolic problems.   An obstruction in the gastrointestinal tract (bowel obstruction).   Illnesses such as diabetes, hepatitis, gallbladder problems, appendicitis, kidney problems, cancer, sepsis, atypical symptoms of a heart attack, or eating disorders.   Medical treatments such as chemotherapy and radiation.   Receiving medicine that makes you sleep (general anesthetic) during surgery.  DIAGNOSIS  Your caregiver may ask for tests to be done if the problems do not improve after a few days. Tests may also be done if symptoms are severe or if the reason for the nausea and vomiting is not clear. Tests may include:   Urine tests.   Blood tests.   Stool tests.   Cultures (to look for evidence of infection).   X-rays or other imaging studies.  Test results can help your caregiver make decisions about treatment or the need for additional tests.  TREATMENT  You need to stay well hydrated. Drink frequently but in small amounts.You may wish to drink water, sports drinks, clear broth, or eat frozen  ice pops or gelatin dessert to help stay hydrated.When you eat, eating slowly may help prevent nausea.There are also some antinausea medicines that may help prevent nausea.  HOME CARE INSTRUCTIONS    Take all medicine as directed by your caregiver.   If you do not have an appetite, do not force yourself to eat. However, you must continue to drink fluids.   If you have an appetite, eat a normal diet unless your caregiver tells you differently.   Eat a variety of complex carbohydrates (rice, wheat, potatoes, bread), lean meats, yogurt, fruits, and vegetables.   Avoid high-fat foods because they are more difficult to digest.   Drink enough water and fluids to keep your urine clear or pale yellow.   If you are dehydrated, ask your caregiver for specific rehydration instructions. Signs of dehydration may include:   Severe thirst.   Dry lips and mouth.   Dizziness.   Dark urine.   Decreasing urine frequency and amount.   Confusion.   Rapid breathing or pulse.  SEEK IMMEDIATE MEDICAL CARE IF:    You have blood or brown flecks (like coffee grounds) in your vomit.   You have black or bloody stools.   You have a severe headache or stiff neck.   You are confused.   You have severe abdominal pain.   You have chest pain or trouble breathing.   You do not urinate at least once every 8 hours.   You develop cold or clammy skin.   You continue to vomit for longer than 24 to 48 hours.   You have a fever.  MAKE SURE YOU:      Understand these instructions.   Will watch your condition.   Will get help right away if you are not doing well or get worse.  Document Released: 05/28/2005 Document Revised: 05/17/2011 Document Reviewed: 10/25/2010  [employer] Patient Information 2012 [employer].

## 2011-08-22 NOTE — Progress Notes (Signed)
S:  3 days of vomiting and diarrhea followed by intense dysuria and frequency last 12 hours.   Results for orders placed in visit on 08/22/11  POCT URINALYSIS DIPSTICK      Component Value Range   Color, UA orange     Clarity, UA cloudy     Glucose, UA 100mg      Bilirubin, UA neg     Ketones, UA trace     Spec Grav, UA 1.015     Blood, UA large     pH, UA 5.5     Protein, UA 100mg      Urobilinogen, UA 1.0     Nitrite, UA pos     Leukocytes, UA large (3+)    POCT UA - MICROSCOPIC ONLY      Component Value Range   WBC, Ur, HPF, POC tntc     RBC, urine, microscopic 1-4     Bacteria, U Microscopic neg     Mucus, UA neg     Epithelial cells, urine per micros 0-1     Crystals, Ur, HPF, POC neg     Casts, Ur, LPF, POC neg     Yeast, UA neg    chest:  Clear Heart: reg, no murmur Abd: mildly tender, no CAT  A:  UTI, acute  P:  Cipro 500 mg bid x 5 days chk U.cx

## 2011-08-23 ENCOUNTER — Encounter: Payer: Self-pay | Admitting: Cardiology

## 2011-08-25 LAB — URINE CULTURE: Colony Count: 15000

## 2011-08-27 ENCOUNTER — Ambulatory Visit: Payer: Medicare Other | Admitting: Cardiology

## 2011-08-30 DIAGNOSIS — IMO0002 Reserved for concepts with insufficient information to code with codable children: Secondary | ICD-10-CM | POA: Diagnosis not present

## 2011-08-30 DIAGNOSIS — M1710 Unilateral primary osteoarthritis, unspecified knee: Secondary | ICD-10-CM | POA: Diagnosis not present

## 2011-10-12 ENCOUNTER — Ambulatory Visit (INDEPENDENT_AMBULATORY_CARE_PROVIDER_SITE_OTHER): Payer: Medicare Other | Admitting: Cardiology

## 2011-10-12 ENCOUNTER — Encounter: Payer: Self-pay | Admitting: Cardiology

## 2011-10-12 VITALS — BP 124/60 | HR 75 | Ht 64.0 in | Wt 199.4 lb

## 2011-10-12 DIAGNOSIS — Z79899 Other long term (current) drug therapy: Secondary | ICD-10-CM

## 2011-10-12 DIAGNOSIS — Z5189 Encounter for other specified aftercare: Secondary | ICD-10-CM | POA: Diagnosis not present

## 2011-10-12 DIAGNOSIS — I495 Sick sinus syndrome: Secondary | ICD-10-CM | POA: Diagnosis not present

## 2011-10-12 DIAGNOSIS — I4891 Unspecified atrial fibrillation: Secondary | ICD-10-CM

## 2011-10-12 DIAGNOSIS — I10 Essential (primary) hypertension: Secondary | ICD-10-CM

## 2011-10-12 NOTE — Assessment & Plan Note (Signed)
She's not having any significant symptomsShe's not having any significant symptoms of bradycardia tachycardia syndrome.

## 2011-10-12 NOTE — Assessment & Plan Note (Signed)
Blood pressures control. No change in therapy. 

## 2011-10-12 NOTE — Assessment & Plan Note (Signed)
Patient is switched from Coumadin to Pradaxa. She takes a very carefully and she does well.

## 2011-10-12 NOTE — Progress Notes (Signed)
HPI Patient seen as followup 6 sinus syndrome with paroxysmal atrial fibrillation.  Facilitation last September, 2012. She was cleared at that time for knee surgery. This was done October, 2012 she's done very well. She has not had any marked palpitations.  Allergies  Allergen Reactions  . Lisinopril     REACTION: Cough  . Penicillins     REACTION: Swelling, Rash  . Penicillins Cross Reactors     Any meds with mycin in the name    Current Outpatient Prescriptions  Medication Sig Dispense Refill  . Calcium Carbonate-Vit D-Min (CALCIUM 600+D PLUS MINERALS) 600-400 MG-UNIT TABS Take 1 tablet by mouth daily.        . Cholecalciferol (VITAMIN D) 400 UNITS capsule Take 400 Units by mouth daily.        . dabigatran (PRADAXA) 150 MG CAPS Take 1 capsule (150 mg total) by mouth every 12 (twelve) hours.  180 capsule  3  . diclofenac sodium (VOLTAREN) 1 % GEL Apply 1 application topically as needed.       . diltiazem (CARDIZEM CD) 240 MG 24 hr capsule Take 1 capsule (240 mg total) by mouth daily.  90 capsule  3  . hydrochlorothiazide (HYDRODIURIL) 25 MG tablet Take 1 tablet (25 mg total) by mouth daily.  90 tablet  3  . levothyroxine (LEVOXYL) 150 MCG tablet Take 150 mcg by mouth daily.        . metoprolol succinate (TOPROL-XL) 25 MG 24 hr tablet Take 1 tablet (25 mg total) by mouth daily.  90 tablet  3  . Multiple Vitamins-Minerals (OCUVITE PO) 1 tab po qd       . Omega-3 Fatty Acids (FISH OIL EXTRA STRENGTH PO) 1 tab po qd       . potassium chloride SA (K-DUR,KLOR-CON) 20 MEQ tablet Take 20 mEq by mouth daily.      . pravastatin (PRAVACHOL) 20 MG tablet Take 1 tablet (20 mg total) by mouth daily.  90 tablet  3  . propafenone (RYTHMOL) 225 MG tablet Take 1 tablet (225 mg total) by mouth 2 (two) times daily.  180 tablet  3    History   Social History  . Marital Status: Widowed    Spouse Name: N/A    Number of Children: N/A  . Years of Education: N/A   Occupational History  . Not on  file.   Social History Main Topics  . Smoking status: Former Smoker    Quit date: 09/06/1990  . Smokeless tobacco: Not on file  . Alcohol Use: No  . Drug Use: No  . Sexually Active: Not on file   Other Topics Concern  . Not on file   Social History Narrative  . No narrative on file    Family History  Problem Relation Age of Onset  . Coronary artery disease      Past Medical History  Diagnosis Date  . Brady-tachy syndrome     Pacemaker   Dr.Allred  . Hypothyroidism   . Atrial fibrillation   . History of hysterectomy   . Drug therapy     Rythmol, atrial fibrillation  . Drug therapy     Pradaxa  (Switch from Coumadin-patient request-March, 2011)  . Pacemaker     Brady tachycardia syndrome  . Cough     Chronic--an ACE inhibitor component  . Hair loss     Patient questioned Coumadin, changed toPradaxa  . Atrial fibrillation     EF 70%, echo, October, 2009, vigorous  LV function  /  nuclear November, 2009, no ischemia, shifting breast attenuation  . Cough     March, 2012  . Nausea     Nausea that occurs 2 or 3 hours after eating that is improved with eating a small snack  . Ejection fraction     EF 70%, echo, October, 2009  . Normal nuclear stress test     Nuclear, November, 2009, breast attenuation  . Preop cardiovascular exam     Clearance for her right knee surgery October, 2012    Past Surgical History  Procedure Date  . Pacemaker placement 2009    PPM-Biotronik    ROS  Patient denies fever, chills, headache, sweats, rash, change in vision, change in hearing, chest pain, cough, nausea vomiting, urinary symptoms. All other systems are reviewed and are negative.  PHYSICAL EXAM Patient is oriented to person time and place. Affect is normal. There is no jugulovenous distention. There no carotid bruits. Lungs are clear. Respiratory effort is nonlabored. Cardiac exam vitals S1 and S2. There no clicks or significant murmurs. The abdomen is soft. There is no  peripheral edema. There no musculoskeletal deformities. There are no skin rashes.  Filed Vitals:   10/12/11 1408  BP: 124/60  Pulse: 75  Height: 5\' 4"  (1.626 m)  Weight: 199 lb 6.4 oz (90.447 kg)   EKG is done today and reviewed by me. Her baseline tracing shows atrial pacing. With a magnet she has dual-chamber pacing.  ASSESSMENT & PLAN

## 2011-10-12 NOTE — Assessment & Plan Note (Signed)
Patient continues on Rythmol diltiazem. She's very stable with this. No change in therapy.

## 2011-10-12 NOTE — Patient Instructions (Signed)
Your physician wants you to follow-up in:  6 months. You will receive a reminder letter in the mail two months in advance. If you don't receive a letter, please call our office to schedule the follow-up appointment.   

## 2011-11-01 ENCOUNTER — Encounter: Payer: Self-pay | Admitting: Family Medicine

## 2011-11-01 ENCOUNTER — Ambulatory Visit (INDEPENDENT_AMBULATORY_CARE_PROVIDER_SITE_OTHER): Payer: Medicare Other | Admitting: Family Medicine

## 2011-11-01 VITALS — BP 132/62 | HR 60 | Temp 97.0°F | Resp 16 | Ht 63.5 in | Wt 198.0 lb

## 2011-11-01 DIAGNOSIS — E039 Hypothyroidism, unspecified: Secondary | ICD-10-CM | POA: Diagnosis not present

## 2011-11-01 LAB — TSH: TSH: 0.598 u[IU]/mL (ref 0.350–4.500)

## 2011-11-01 LAB — T4, FREE: Free T4: 1.61 ng/dL (ref 0.80–1.80)

## 2011-11-01 NOTE — Progress Notes (Signed)
This 78 year old retired woman comes in to get established. In particular she is have her thyroid checked. It was last evaluated in change in October of 2012. At that time her dose was increased from 150 mcg to 175 mcg of levothyroxine.  Patient's history of thyroid disease dates back approximately 7 years when she was diagnosed with hyperthyroidism. She was treated with radioactive iodine and her thyroid was completely ablated. She's been taking thyroid replacement since.  Patient is careful about taking her thyroid on an empty stomach and never with other medicines. She's having no new symptoms referable to her thyroid disease.  Past medical history is significant for atrial fibrillation which is intermittent at this time.  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  Objective: Older woman in no distress, overweight and well groomed  HEENT: Unremarkable  Chest: Clear  Heart: Regular with a 2/6 early systolic murmur, no gallop  Extremities: No edema  Skin: No rash  Assessment: Stable on current dose clinically, this seems to be a very high dose however.  Plan: Set up time for physical exam  TS H. and free T4 today 1. Hypothyroid  TSH, T4, Free

## 2011-11-01 NOTE — Patient Instructions (Signed)

## 2011-11-06 DIAGNOSIS — H524 Presbyopia: Secondary | ICD-10-CM | POA: Diagnosis not present

## 2011-11-06 DIAGNOSIS — Z961 Presence of intraocular lens: Secondary | ICD-10-CM | POA: Diagnosis not present

## 2011-11-13 DIAGNOSIS — D1801 Hemangioma of skin and subcutaneous tissue: Secondary | ICD-10-CM | POA: Diagnosis not present

## 2011-11-13 DIAGNOSIS — D235 Other benign neoplasm of skin of trunk: Secondary | ICD-10-CM | POA: Diagnosis not present

## 2011-11-13 DIAGNOSIS — D485 Neoplasm of uncertain behavior of skin: Secondary | ICD-10-CM | POA: Diagnosis not present

## 2011-11-13 DIAGNOSIS — Z85828 Personal history of other malignant neoplasm of skin: Secondary | ICD-10-CM | POA: Diagnosis not present

## 2011-11-21 ENCOUNTER — Encounter: Payer: Self-pay | Admitting: Family Medicine

## 2011-11-21 ENCOUNTER — Ambulatory Visit (INDEPENDENT_AMBULATORY_CARE_PROVIDER_SITE_OTHER): Payer: Medicare Other | Admitting: Family Medicine

## 2011-11-21 VITALS — BP 122/59 | HR 96 | Temp 97.8°F | Resp 20 | Ht 64.0 in | Wt 200.2 lb

## 2011-11-21 DIAGNOSIS — M1990 Unspecified osteoarthritis, unspecified site: Secondary | ICD-10-CM

## 2011-11-21 DIAGNOSIS — R8281 Pyuria: Secondary | ICD-10-CM

## 2011-11-21 DIAGNOSIS — R5383 Other fatigue: Secondary | ICD-10-CM

## 2011-11-21 DIAGNOSIS — R2681 Unsteadiness on feet: Secondary | ICD-10-CM

## 2011-11-21 DIAGNOSIS — Z0000 Encounter for general adult medical examination without abnormal findings: Secondary | ICD-10-CM | POA: Diagnosis not present

## 2011-11-21 DIAGNOSIS — R5381 Other malaise: Secondary | ICD-10-CM | POA: Diagnosis not present

## 2011-11-21 DIAGNOSIS — E785 Hyperlipidemia, unspecified: Secondary | ICD-10-CM

## 2011-11-21 DIAGNOSIS — M549 Dorsalgia, unspecified: Secondary | ICD-10-CM

## 2011-11-21 DIAGNOSIS — R82998 Other abnormal findings in urine: Secondary | ICD-10-CM | POA: Diagnosis not present

## 2011-11-21 DIAGNOSIS — Z23 Encounter for immunization: Secondary | ICD-10-CM

## 2011-11-21 LAB — POCT UA - MICROSCOPIC ONLY
Casts, Ur, LPF, POC: NEGATIVE
Crystals, Ur, HPF, POC: NEGATIVE
Mucus, UA: NEGATIVE
Yeast, UA: NEGATIVE

## 2011-11-21 LAB — POCT URINALYSIS DIPSTICK
Bilirubin, UA: NEGATIVE
Glucose, UA: NEGATIVE
Ketones, UA: NEGATIVE
Nitrite, UA: NEGATIVE
Protein, UA: NEGATIVE
Spec Grav, UA: 1.025
Urobilinogen, UA: 0.2
pH, UA: 7

## 2011-11-21 LAB — IFOBT (OCCULT BLOOD): IFOBT: NEGATIVE

## 2011-11-21 MED ORDER — TETANUS-DIPHTH-ACELL PERTUSSIS 5-2.5-18.5 LF-MCG/0.5 IM SUSP: 0.5000 mL | Freq: Once | INTRAMUSCULAR | Status: DC

## 2011-11-21 NOTE — Progress Notes (Signed)
@[email or web address] @  Patient ID: [name] MRN: [id number], DOB: [birth year], 78 y.o. Date of Encounter: 11/21/2011, 12:55 PM  Primary Physician: Lillia Mountain, MD  Chief Complaint: Physical (CPE)  HPI: 78 y.o. y/o female with history of noted below here for CPE.  Doing well. Complains of chronic cough (x years) which improves with nasal spray which she doesn't like taking since it caused all nasal hairs to fall out.  She has recently restarted the fluticasone Complains of chronic fatigue Notes chronic use of walker since her total knee right last October.   H/O chronic back pain since fall in 1987 which fluctuates in intensity  LMP: 20 years ago at least Pap: 20 years ago MMG: over 5 years ago.  She knows this is her responsibility Review of Systems: Consitutional: No fever, chills, night sweats, lymphadenopathy, or weight changes. Eyes: No visual changes, eye redness, or discharge. ENT/Mouth: Ears: No otalgia, tinnitus, hearing loss, discharge. Nose: No congestion, rhinorrhea, sinus pain, or epistaxis. Throat: No sore throat, post nasal drip, or teeth pain. Cardiovascular: No CP, palpitations, diaphoresis, DOE, edema, orthopnea, PND. Respiratory: No hemoptysis, SOB, or wheezing. Gastrointestinal: No anorexia, dysphagia, reflux, pain, nausea, vomiting, hematemesis, diarrhea, constipation, BRBPR, or melena. Breast: No discharge, pain, swelling, or mass. Genitourinary: No dysuria, frequency, urgency, hematuria, incontinence, nocturia, amenorrhea, vaginal discharge, pruritis, burning, abnormal bleeding, or pain. Musculoskeletal: No decreased ROM, myalgias, stiffness, joint swelling, or weakness. Skin: No rash, erythema, lesion changes, pain, warmth, jaundice, or pruritis. Neurological: No headache, dizziness, syncope, seizures, tremors, memory loss, coordination problems, or paresthesias. Psychological: No anxiety, depression, hallucinations, SI/HI. Endocrine: No fatigue, polydipsia,  polyphagia, polyuria, or known diabetes. All other systems were reviewed and are otherwise negative. Questionnaire for depression score:  0 We reviewed advance directives copy of which is in administration at [facility]  Past Medical History  Diagnosis Date  . Brady-tachy syndrome     Pacemaker   Dr.Allred  . Hypothyroidism   . Atrial fibrillation   . History of hysterectomy   . Drug therapy     Rythmol, atrial fibrillation  . Drug therapy     Pradaxa  (Switch from Coumadin-patient request-March, 2011)  . Pacemaker     Brady tachycardia syndrome  . Cough     Chronic--an ACE inhibitor component  . Hair loss     Patient questioned Coumadin, changed toPradaxa  . Atrial fibrillation     EF 70%, echo, October, 2009, vigorous LV function  /  nuclear November, 2009, no ischemia, shifting breast attenuation  . Cough     March, 2012  . Nausea     Nausea that occurs 2 or 3 hours after eating that is improved with eating a small snack  . Ejection fraction     EF 70%, echo, October, 2009  . Normal nuclear stress test     Nuclear, November, 2009, breast attenuation  . Preop cardiovascular exam     Clearance for her right knee surgery October, 2012  . Back pain   . Heart murmur   . Hypertension      Past Surgical History  Procedure Date  . Pacemaker placement 2009    PPM-Biotronik  . Abdominal hysterectomy     Home Meds:  Prior to Admission medications   Medication Sig Start Date End Date Taking? Authorizing Provider  Calcium Carbonate-Vit D-Min (CALCIUM 600+D PLUS MINERALS) 600-400 MG-UNIT TABS Take 1 tablet by mouth daily.     Yes Historical Provider, MD  Cholecalciferol (VITAMIN D) 400 UNITS  capsule Take 400 Units by mouth daily.     Yes Historical Provider, MD  dabigatran (PRADAXA) 150 MG CAPS Take 1 capsule (150 mg total) by mouth every 12 (twelve) hours. 05/22/11  Yes Luis Abed, MD  diltiazem (CARDIZEM CD) 240 MG 24 hr capsule Take 1 capsule (240 mg total) by  mouth daily. 05/22/11  Yes Luis Abed, MD  fluticasone [place]) 50 MCG/ACT nasal spray Place 2 sprays into the nose daily.   Yes Historical Provider, MD  hydrochlorothiazide (HYDRODIURIL) 25 MG tablet Take 1 tablet (25 mg total) by mouth daily. 05/22/11  Yes Luis Abed, MD  levothyroxine (LEVOXYL) 150 MCG tablet Take 175 mcg by mouth daily.    Yes Historical Provider, MD  metoprolol succinate (TOPROL-XL) 25 MG 24 hr tablet Take 1 tablet (25 mg total) by mouth daily. 05/22/11  Yes Luis Abed, MD  Multiple Vitamins-Minerals (OCUVITE PO) 1 tab po qd    Yes Historical Provider, MD  Omega-3 Fatty Acids (FISH OIL EXTRA STRENGTH PO) 1 tab po qd    Yes Historical Provider, MD  pravastatin (PRAVACHOL) 20 MG tablet Take 1 tablet (20 mg total) by mouth daily. 05/22/11  Yes Luis Abed, MD  propafenone (RYTHMOL) 225 MG tablet Take 1 tablet (225 mg total) by mouth 2 (two) times daily. 05/22/11  Yes Luis Abed, MD  diclofenac sodium (VOLTAREN) 1 % GEL Apply 1 application topically as needed.     Historical Provider, MD  potassium chloride SA (K-DUR,KLOR-CON) 20 MEQ tablet Take 20 mEq by mouth daily.    Historical Provider, MD    Allergies:  Allergies  Allergen Reactions  . Erythromycin   . Lisinopril     REACTION: Cough  . Penicillins     REACTION: Swelling, Rash  . Penicillins Cross Reactors     Any meds with mycin in the name    History   Social History  . Marital Status: Widowed    Spouse Name: N/A    Number of Children: N/A  . Years of Education: N/A   Occupational History  . Not on file.   Social History Main Topics  . Smoking status: Former Smoker    Quit date: 09/06/1990  . Smokeless tobacco: Not on file  . Alcohol Use: No  . Drug Use: No  . Sexually Active: Not on file   Other Topics Concern  . Not on file   Social History Narrative  . No narrative on file    Family History  Problem Relation Age of Onset  . Coronary artery disease    . Heart disease  Father   . Heart disease Son     Physical Exam: overweight Blood pressure 122/59, pulse 96, temperature 97.8 F (36.6 C), resp. rate 20, height 5\' 4"  (1.626 m), weight 200 lb 3.2 oz (90.81 kg)., Body mass index is 34.36 kg/(m^2). General: Well developed, well nourished, in no acute distress. HEENT: Normocephalic, atraumatic. Conjunctiva pink, sclera non-icteric. Pupils 2 mm constricting to 1 mm, round, regular, and equally reactive to light and accomodation. EOMI. Internal auditory canal clear. TMs with good cone of light and without pathology. Nasal mucosa pink. Nares are without discharge. No sinus tenderness. Oral mucosa pink. Dentition: edentulous. Pharynx without exudate.   Neck: Supple. Trachea midline. No thyromegaly. Full ROM. No lymphadenopathy. Lungs: Clear to auscultation bilaterally without wheezes, rales, or rhonchi. Breathing is of normal effort and unlabored. Cardiovascular: RRR with S1 S2. No murmurs, rubs, or gallops appreciated. Distal  pulses 2+ symmetrically. No carotid or abdominal bruits. Breast: Symmetrical. No masses. Nipples without discharge. Abdomen: Soft, non-tender, non-distended with normoactive bowel sounds. No hepatosplenomegaly or masses. No rebound/guarding. No CVA tenderness. Without hernias. Rectal exam: no masses.  Hemoccult done. Musculoskeletal: Full range of motion and 5/5 strength throughout. Without swelling, atrophy, tenderness, crepitus, or warmth. Extremities without clubbing, cyanosis, or edema. Calves supple. Skin: Warm and moist without erythema, ecchymosis, wounds, or rash. Neuro: A+Ox3. CN II-XII grossly intact. Moves all extremities spontaneously. Full sensation throughout. Normal gait. DTR 2+ throughout upper and lower extremities. Finger to nose intact. Psych:  Responds to questions appropriately with a normal affect.   Studies:  CMET, Lipid all pending. Patient is not fasting UA:  Results for orders placed in visit on 11/01/11  TSH       Component Value Range   TSH 0.598  0.350 - 4.500 uIU/mL  T4, FREE      Component Value Range   Free T4 1.61  0.80 - 1.80 ng/dL     Assessment/Plan:  78 y.o. y/o female here for CPE.  It's unclear why patient has the fatigue. It's possible that the Rythmol is playing a role in this. It's also possible that she has an abnormality of her potassium. Her thyroid levels at the last visit were fine however.  I do think that her recovery from the total right knee surgery last October left her with an unsteady gait that needs attention as well.  Also of concern is her persistent congested cough. She does not seem bothered by this too much and she is willing to restart her fluticasone nasal spray. - 1. Healthcare maintenance  Lipid panel, IFOBT POC (occult bld, rslt in office), Comprehensive metabolic panel, TDaP (BOOSTRIX) injection 0.5 mL, POCT UA - Microscopic Only, POCT urinalysis dipstick  2. Hyperlipidemia  Lipid panel  3. Routine general medical examination at a health care facility  POCT UA - Microscopic Only, POCT urinalysis dipstick   I see no reason to administer the Hep B vaccine, as patient has no risk factors for contracting the disease She has had a pneumonia vaccine post age 50, so this is also not indicated. The PAP test is not indicated due to patient lifestyle and age.  Signed, Elvina Sidle, MD 11/21/2011 12:55 PM   Results for orders placed in visit on 11/21/11  IFOBT (OCCULT BLOOD)      Component Value Range   IFOBT Negative    POCT UA - MICROSCOPIC ONLY      Component Value Range   WBC, Ur, HPF, POC 0-2     RBC, urine, microscopic 0-1     Bacteria, U Microscopic trace     Mucus, UA neg     Epithelial cells, urine per micros 2-5     Crystals, Ur, HPF, POC neg     Casts, Ur, LPF, POC neg     Yeast, UA neg    POCT URINALYSIS DIPSTICK      Component Value Range   Color, UA yellow     Clarity, UA hazy     Glucose, UA neg     Bilirubin, UA neg     Ketones, UA neg      Spec Grav, UA 1.025     Blood, UA trace     pH, UA 7.0     Protein, UA neg     Urobilinogen, UA 0.2     Nitrite, UA neg     Leukocytes, UA moderate (2+)

## 2011-11-21 NOTE — Patient Instructions (Addendum)
Advance Directives (My Voice, My Choice) Advance directives are a means for you to make choices about your health care. It is a way that you may accept or refuse medical treatment if you cannot speak for yourself. An advance directive gives you a way to express your wishes about treatment choices in the event that you cannot speak for yourself. These directives protect your right to make your own health care choices. Some examples of advance directives would be:  A living will is a prepared document that designates your wishes in the event of a serious illness when you cannot care for yourself.   A patient advocate designation for health care means you choose someone who knows your wishes and can speak for you, on your behalf, should you not be able to do so yourself. This is often a close friend or family member.   Think about what is important for you in your life. To what extent do you want machines to keep you alive? How much pain are you willing to accept?   Decide what types of life-sustaining treatments you would or would not want.   Name a person to be your advocate who understands all your wishes and is willing and able to carry them out.   A durable power of attorney for health care is a formal legal agreement with an attorney or legal representative who will be bound to carry out your wishes in the event you are unable to care for or represent yourself. This should be someone you trust to make important medical decisions for you.   Do Not Resuscitate (DNR) is a request to do nothing in the event that your heart stops. A DNR order is used if you are very ill and not expected to recover. DNR orders are accepted by nearly all caregivers and hospitals.  Most caregiver's offices and hospitals have advance directive forms you can use. You may cancel or change these documents at any time. You must be mentally sound and able to communicate your wishes at the time you fill out these  forms. Regardless of how you let your final wishes be known in the event of a terminal illness, make sure you discuss them with your family and friends. Copies should be given to your caregiver, your hospital, your advocate or attorney, and to significant others. If you travel, you may want to find out what is legal and binding in the states where you will be. Laws vary from state to state. Document Released: 08/06/2001 Document Revised: 05/17/2011 Document Reviewed: 02/03/2008 ExitCare Patient Information 2012 [employer].   Health Maintenance, Females A healthy lifestyle and preventative care can promote health and wellness.  Maintain regular health, dental, and eye exams.   Eat a healthy diet. Foods like vegetables, fruits, whole grains, low-fat dairy products, and lean protein foods contain the nutrients you need without too many calories. Decrease your intake of foods high in solid fats, added sugars, and salt. Get information about a proper diet from your caregiver, if necessary.   Regular physical exercise is one of the most important things you can do for your health. Most adults should get at least 150 minutes of moderate-intensity exercise (any activity that increases your heart rate and causes you to sweat) each week. In addition, most adults need muscle-strengthening exercises on 2 or more days a week.    Maintain a healthy weight. The body mass index (BMI) is a screening tool to identify possible weight problems. It provides   an estimate of body fat based on height and weight. Your caregiver can help determine your BMI, and can help you achieve or maintain a healthy weight. For adults 20 years and older:   A BMI below 18.5 is considered underweight.   A BMI of 18.5 to 24.9 is normal.   A BMI of 25 to 29.9 is considered overweight.   A BMI of 30 and above is considered obese.   Maintain normal blood lipids and cholesterol by exercising and minimizing your intake of saturated  fat. Eat a balanced diet with plenty of fruits and vegetables. Blood tests for lipids and cholesterol should begin at age 20 and be repeated every 5 years. If your lipid or cholesterol levels are high, you are over 50, or you are a high risk for heart disease, you may need your cholesterol levels checked more frequently.Ongoing high lipid and cholesterol levels should be treated with medicines if diet and exercise are not effective.   If you smoke, find out from your caregiver how to quit. If you do not use tobacco, do not start.   If you are pregnant, do not drink alcohol. If you are breastfeeding, be very cautious about drinking alcohol. If you are not pregnant and choose to drink alcohol, do not exceed 1 drink per day. One drink is considered to be 12 ounces (355 mL) of beer, 5 ounces (148 mL) of wine, or 1.5 ounces (44 mL) of liquor.   Avoid use of street drugs. Do not share needles with anyone. Ask for help if you need support or instructions about stopping the use of drugs.   High blood pressure causes heart disease and increases the risk of stroke. Blood pressure should be checked at least every 1 to 2 years. Ongoing high blood pressure should be treated with medicines, if weight loss and exercise are not effective.   If you are 55 to 76 years old, ask your caregiver if you should take aspirin to prevent strokes.   Diabetes screening involves taking a blood sample to check your fasting blood sugar level. This should be done once every 3 years, after age 45, if you are within normal weight and without risk factors for diabetes. Testing should be considered at a younger age or be carried out more frequently if you are overweight and have at least 1 risk factor for diabetes.   Breast cancer screening is essential preventative care for women. You should practice "breast self-awareness." This means understanding the normal appearance and feel of your breasts and may include breast self-examination.  Any changes detected, no matter how small, should be reported to a caregiver. Women in their 20s and 30s should have a clinical breast exam (CBE) by a caregiver as part of a regular health exam every 1 to 3 years. After age 40, women should have a CBE every year. Starting at age 40, women should consider having a mammogram (breast X-Reny) every year. Women who have a family history of breast cancer should talk to their caregiver about genetic screening. Women at a high risk of breast cancer should talk to their caregiver about having an MRI and a mammogram every year.   The Pap test is a screening test for cervical cancer. Women should have a Pap test starting at age 21. Between ages 21 and 29, Pap tests should be repeated every 2 years. Beginning at age 30, you should have a Pap test every 3 years as long as the past 3 Pap   tests have been normal. If you had a hysterectomy for a problem that was not cancer or a condition that could lead to cancer, then you no longer need Pap tests. If you are between ages 65 and 70, and you have had normal Pap tests going back 10 years, you no longer need Pap tests. If you have had past treatment for cervical cancer or a condition that could lead to cancer, you need Pap tests and screening for cancer for at least 20 years after your treatment. If Pap tests have been discontinued, risk factors (such as a new sexual partner) need to be reassessed to determine if screening should be resumed. Some women have medical problems that increase the chance of getting cervical cancer. In these cases, your caregiver may recommend more frequent screening and Pap tests.   The human papillomavirus (HPV) test is an additional test that may be used for cervical cancer screening. The HPV test looks for the virus that can cause the cell changes on the cervix. The cells collected during the Pap test can be tested for HPV. The HPV test could be used to screen women aged 30 years and older, and should  be used in women of any age who have unclear Pap test results. After the age of 30, women should have HPV testing at the same frequency as a Pap test.   Colorectal cancer can be detected and often prevented. Most routine colorectal cancer screening begins at the age of 50 and continues through age 75. However, your caregiver may recommend screening at an earlier age if you have risk factors for colon cancer. On a yearly basis, your caregiver may provide home test kits to check for hidden blood in the stool. Use of a small camera at the end of a tube, to directly examine the colon (sigmoidoscopy or colonoscopy), can detect the earliest forms of colorectal cancer. Talk to your caregiver about this at age 50, when routine screening begins. Direct examination of the colon should be repeated every 5 to 10 years through age 75, unless early forms of pre-cancerous polyps or small growths are found.   Hepatitis C blood testing is recommended for all people born from 1945 through 1965 and any individual with known risks for hepatitis C.   Practice safe sex. Use condoms and avoid high-risk sexual practices to reduce the spread of sexually transmitted infections (STIs). Sexually active women aged 25 and younger should be checked for Chlamydia, which is a common sexually transmitted infection. Older women with new or multiple partners should also be tested for Chlamydia. Testing for other STIs is recommended if you are sexually active and at increased risk.   Osteoporosis is a disease in which the bones lose minerals and strength with aging. This can result in serious bone fractures. The risk of osteoporosis can be identified using a bone density scan. Women ages 65 and over and women at risk for fractures or osteoporosis should discuss screening with their caregivers. Ask your caregiver whether you should be taking a calcium supplement or vitamin D to reduce the rate of osteoporosis.   Menopause can be associated  with physical symptoms and risks. Hormone replacement therapy is available to decrease symptoms and risks. You should talk to your caregiver about whether hormone replacement therapy is right for you.   Use sunscreen with a sun protection factor (SPF) of 30 or greater. Apply sunscreen liberally and repeatedly throughout the day. You should seek shade when your shadow is shorter   than you. Protect yourself by wearing long sleeves, pants, a wide-brimmed hat, and sunglasses year round, whenever you are outdoors.   Notify your caregiver of new moles or changes in moles, especially if there is a change in shape or color. Also notify your caregiver if a mole is larger than the size of a pencil eraser.   Stay current with your immunizations.  Document Released: 12/11/2010 Document Revised: 05/17/2011 Document Reviewed: 12/11/2010 [employer] Patient Information 2012 [employer]. 

## 2011-11-22 LAB — CBC WITH DIFFERENTIAL/PLATELET
Basophils Absolute: 0 10*3/uL (ref 0.0–0.1)
Basophils Relative: 1 % (ref 0–1)
Eosinophils Absolute: 0.6 10*3/uL (ref 0.0–0.7)
Eosinophils Relative: 10 % — ABNORMAL HIGH (ref 0–5)
HCT: 45.8 % (ref 36.0–46.0)
Hemoglobin: 15.2 g/dL — ABNORMAL HIGH (ref 12.0–15.0)
Lymphocytes Relative: 36 % (ref 12–46)
Lymphs Abs: 2.1 10*3/uL (ref 0.7–4.0)
MCH: 31 pg (ref 26.0–34.0)
MCHC: 33.2 g/dL (ref 30.0–36.0)
MCV: 93.5 fL (ref 78.0–100.0)
Monocytes Absolute: 0.6 10*3/uL (ref 0.1–1.0)
Monocytes Relative: 11 % (ref 3–12)
Neutro Abs: 2.5 10*3/uL (ref 1.7–7.7)
Neutrophils Relative %: 42 % — ABNORMAL LOW (ref 43–77)
Platelets: 220 10*3/uL (ref 150–400)
RBC: 4.9 MIL/uL (ref 3.87–5.11)
RDW: 13.8 % (ref 11.5–15.5)
WBC: 5.8 10*3/uL (ref 4.0–10.5)

## 2011-11-22 LAB — COMPREHENSIVE METABOLIC PANEL
ALT: 51 U/L — ABNORMAL HIGH (ref 0–35)
AST: 56 U/L — ABNORMAL HIGH (ref 0–37)
Albumin: 4 g/dL (ref 3.5–5.2)
Alkaline Phosphatase: 58 U/L (ref 39–117)
BUN: 16 mg/dL (ref 6–23)
CO2: 26 mEq/L (ref 19–32)
Calcium: 9.4 mg/dL (ref 8.4–10.5)
Chloride: 106 mEq/L (ref 96–112)
Creat: 0.93 mg/dL (ref 0.50–1.10)
Glucose, Bld: 100 mg/dL — ABNORMAL HIGH (ref 70–99)
Potassium: 3.9 mEq/L (ref 3.5–5.3)
Sodium: 143 mEq/L (ref 135–145)
Total Bilirubin: 0.4 mg/dL (ref 0.3–1.2)
Total Protein: 6.9 g/dL (ref 6.0–8.3)

## 2011-11-22 LAB — LIPID PANEL
Cholesterol: 165 mg/dL (ref 0–200)
HDL: 42 mg/dL (ref >39)
LDL Cholesterol: 98 mg/dL (ref 0–99)
Total CHOL/HDL Ratio: 3.9 Ratio
Triglycerides: 127 mg/dL (ref <150)
VLDL: 25 mg/dL (ref 0–40)

## 2011-11-22 LAB — SEDIMENTATION RATE: Sed Rate: 5 mm/hr (ref 0–22)

## 2011-11-24 LAB — URINE CULTURE: Colony Count: 25000

## 2011-11-26 ENCOUNTER — Other Ambulatory Visit: Payer: Self-pay | Admitting: Family Medicine

## 2011-11-26 MED ORDER — TRIMETHOPRIM 100 MG PO TABS: 100.0000 mg | ORAL_TABLET | Freq: Two times a day (BID) | ORAL | Status: AC

## 2011-11-28 ENCOUNTER — Telehealth: Payer: Self-pay

## 2011-11-28 DIAGNOSIS — R269 Unspecified abnormalities of gait and mobility: Secondary | ICD-10-CM | POA: Diagnosis not present

## 2011-11-28 DIAGNOSIS — M545 Low back pain, unspecified: Secondary | ICD-10-CM | POA: Diagnosis not present

## 2011-11-28 DIAGNOSIS — R5381 Other malaise: Secondary | ICD-10-CM | POA: Diagnosis not present

## 2011-11-28 DIAGNOSIS — R5383 Other fatigue: Secondary | ICD-10-CM | POA: Diagnosis not present

## 2011-11-28 MED ORDER — NITROFURANTOIN MONOHYD MACRO 100 MG PO CAPS: 100.0000 mg | ORAL_CAPSULE | Freq: Two times a day (BID) | ORAL | Status: AC

## 2011-11-28 NOTE — Telephone Encounter (Signed)
See labs. Can we please send abx to [facility] on [address]. Thanks

## 2011-11-28 NOTE — Telephone Encounter (Signed)
Nitrofurantoin sent to pharmacy.

## 2011-11-30 DIAGNOSIS — R5381 Other malaise: Secondary | ICD-10-CM | POA: Diagnosis not present

## 2011-11-30 DIAGNOSIS — R269 Unspecified abnormalities of gait and mobility: Secondary | ICD-10-CM | POA: Diagnosis not present

## 2011-11-30 DIAGNOSIS — M545 Low back pain, unspecified: Secondary | ICD-10-CM | POA: Diagnosis not present

## 2011-11-30 DIAGNOSIS — R5383 Other fatigue: Secondary | ICD-10-CM | POA: Diagnosis not present

## 2011-12-03 DIAGNOSIS — R5381 Other malaise: Secondary | ICD-10-CM | POA: Diagnosis not present

## 2011-12-03 DIAGNOSIS — R269 Unspecified abnormalities of gait and mobility: Secondary | ICD-10-CM | POA: Diagnosis not present

## 2011-12-03 DIAGNOSIS — M545 Low back pain, unspecified: Secondary | ICD-10-CM | POA: Diagnosis not present

## 2011-12-05 DIAGNOSIS — R5381 Other malaise: Secondary | ICD-10-CM | POA: Diagnosis not present

## 2011-12-05 DIAGNOSIS — M545 Low back pain, unspecified: Secondary | ICD-10-CM | POA: Diagnosis not present

## 2011-12-05 DIAGNOSIS — R5383 Other fatigue: Secondary | ICD-10-CM | POA: Diagnosis not present

## 2011-12-05 DIAGNOSIS — R269 Unspecified abnormalities of gait and mobility: Secondary | ICD-10-CM | POA: Diagnosis not present

## 2011-12-07 DIAGNOSIS — R269 Unspecified abnormalities of gait and mobility: Secondary | ICD-10-CM | POA: Diagnosis not present

## 2011-12-07 DIAGNOSIS — R5383 Other fatigue: Secondary | ICD-10-CM | POA: Diagnosis not present

## 2011-12-07 DIAGNOSIS — R5381 Other malaise: Secondary | ICD-10-CM | POA: Diagnosis not present

## 2011-12-07 DIAGNOSIS — M545 Low back pain, unspecified: Secondary | ICD-10-CM | POA: Diagnosis not present

## 2011-12-11 DIAGNOSIS — R5381 Other malaise: Secondary | ICD-10-CM | POA: Diagnosis not present

## 2011-12-11 DIAGNOSIS — M545 Low back pain, unspecified: Secondary | ICD-10-CM | POA: Diagnosis not present

## 2011-12-11 DIAGNOSIS — R5383 Other fatigue: Secondary | ICD-10-CM | POA: Diagnosis not present

## 2011-12-11 DIAGNOSIS — R269 Unspecified abnormalities of gait and mobility: Secondary | ICD-10-CM | POA: Diagnosis not present

## 2011-12-12 DIAGNOSIS — R5381 Other malaise: Secondary | ICD-10-CM | POA: Diagnosis not present

## 2011-12-12 DIAGNOSIS — M545 Low back pain, unspecified: Secondary | ICD-10-CM | POA: Diagnosis not present

## 2011-12-12 DIAGNOSIS — R269 Unspecified abnormalities of gait and mobility: Secondary | ICD-10-CM | POA: Diagnosis not present

## 2011-12-12 DIAGNOSIS — R5383 Other fatigue: Secondary | ICD-10-CM | POA: Diagnosis not present

## 2011-12-14 ENCOUNTER — Other Ambulatory Visit: Payer: Self-pay | Admitting: Family Medicine

## 2011-12-14 DIAGNOSIS — R5381 Other malaise: Secondary | ICD-10-CM | POA: Diagnosis not present

## 2011-12-14 DIAGNOSIS — R5383 Other fatigue: Secondary | ICD-10-CM | POA: Diagnosis not present

## 2011-12-14 DIAGNOSIS — M545 Low back pain, unspecified: Secondary | ICD-10-CM | POA: Diagnosis not present

## 2011-12-14 DIAGNOSIS — R269 Unspecified abnormalities of gait and mobility: Secondary | ICD-10-CM | POA: Diagnosis not present

## 2011-12-18 DIAGNOSIS — M545 Low back pain, unspecified: Secondary | ICD-10-CM | POA: Diagnosis not present

## 2011-12-18 DIAGNOSIS — R269 Unspecified abnormalities of gait and mobility: Secondary | ICD-10-CM | POA: Diagnosis not present

## 2011-12-18 DIAGNOSIS — R5381 Other malaise: Secondary | ICD-10-CM | POA: Diagnosis not present

## 2011-12-20 DIAGNOSIS — M545 Low back pain, unspecified: Secondary | ICD-10-CM | POA: Diagnosis not present

## 2011-12-20 DIAGNOSIS — R5381 Other malaise: Secondary | ICD-10-CM | POA: Diagnosis not present

## 2011-12-20 DIAGNOSIS — R269 Unspecified abnormalities of gait and mobility: Secondary | ICD-10-CM | POA: Diagnosis not present

## 2011-12-26 ENCOUNTER — Encounter: Payer: Self-pay | Admitting: Internal Medicine

## 2011-12-26 ENCOUNTER — Ambulatory Visit (INDEPENDENT_AMBULATORY_CARE_PROVIDER_SITE_OTHER): Payer: Medicare Other | Admitting: Internal Medicine

## 2011-12-26 VITALS — BP 128/62 | HR 61 | Resp 18 | Ht 63.0 in | Wt 201.0 lb

## 2011-12-26 DIAGNOSIS — I4891 Unspecified atrial fibrillation: Secondary | ICD-10-CM

## 2011-12-26 DIAGNOSIS — R5381 Other malaise: Secondary | ICD-10-CM

## 2011-12-26 DIAGNOSIS — I10 Essential (primary) hypertension: Secondary | ICD-10-CM | POA: Diagnosis not present

## 2011-12-26 DIAGNOSIS — I495 Sick sinus syndrome: Secondary | ICD-10-CM

## 2011-12-26 DIAGNOSIS — R5383 Other fatigue: Secondary | ICD-10-CM

## 2011-12-26 LAB — PACEMAKER DEVICE OBSERVATION
AL IMPEDENCE PM: 549 Ohm
ATRIAL PACING PM: 71
RV LEAD IMPEDENCE PM: 869 Ohm
VENTRICULAR PACING PM: 26

## 2011-12-26 MED ORDER — FLECAINIDE ACETATE 100 MG PO TABS: 100.0000 mg | ORAL_TABLET | Freq: Two times a day (BID) | ORAL | Status: DC

## 2011-12-26 NOTE — Assessment & Plan Note (Signed)
As above.

## 2011-12-26 NOTE — Patient Instructions (Addendum)
Your physician wants you to follow-up in: 6 months in the device clinic and 12 months with Dr Johney Frame Bonita Quin will receive a reminder letter in the mail two months in advance. If you don't receive a letter, please call our office to schedule the follow-up appointment.  Your physician has recommended you make the following change in your medication:  1) Stop Rythmol 2) Start Flecanide 100mg  bid

## 2011-12-26 NOTE — Assessment & Plan Note (Signed)
Stable No change required today  

## 2011-12-26 NOTE — Assessment & Plan Note (Signed)
Normal pacemaker function See Pace Art report No changes today  

## 2011-12-26 NOTE — Assessment & Plan Note (Signed)
Stable by pacemaker interrogation, though she does have occasional episodes She would like to stop rhythmol.  Clearly she needs an AAD at this point. I dont think that rhythmol is the source for her troubles but am willing to try something else.  I suspect that her fatigue may actually be due to sleep apnea. She is very clear that she will not pursue sleep study at this time.  Stop rhythmol Start flecainide 100mg  BID Consider stopping toprol if fatigue does not improve I think that a sleep study would be quite helpful should she change her mind in the future.

## 2011-12-26 NOTE — Progress Notes (Signed)
Primary Cardiologist:  Dr Myrtis Ser PCP: Lillia Mountain, MD   The patient presents today for routine electrophysiology followup.  Since last being seen in our clinic, the patient reports doing well.  Her afib is stable. Her concern today is that she is "tired" in the mornings.  She is up during the night to use the bathroom and does not feel well rested up waking.  She snores.  She is concerned that rhythmol may be the cause for her feeling tired.  Today, she denies symptoms of palpitations, chest pain, shortness of breath, orthopnea, PND, lower extremity edema, dizziness, presyncope, syncope, or neurologic sequela.  The patient feels that she is tolerating medications without difficulties and is otherwise without complaint today.   Past Medical History  Diagnosis Date  . Brady-tachy syndrome     Pacemaker   Dr.Wenonah Milo  . Hypothyroidism   . Atrial fibrillation   . History of hysterectomy   . Drug therapy     Rythmol, atrial fibrillation  . Drug therapy     Pradaxa  (Switch from Coumadin-patient request-March, 2011)  . Pacemaker     Brady tachycardia syndrome  . Cough     Chronic--an ACE inhibitor component  . Hair loss     Patient questioned Coumadin, changed toPradaxa  . Atrial fibrillation     EF 70%, echo, October, 2009, vigorous LV function  /  nuclear November, 2009, no ischemia, shifting breast attenuation  . Cough     March, 2012  . Nausea     Nausea that occurs 2 or 3 hours after eating that is improved with eating a small snack  . Ejection fraction     EF 70%, echo, October, 2009  . Normal nuclear stress test     Nuclear, November, 2009, breast attenuation  . Preop cardiovascular exam     Clearance for her right knee surgery October, 2012  . Back pain   . Heart murmur   . Hypertension    Past Surgical History  Procedure Date  . Pacemaker placement 2009    PPM-Biotronik  . Abdominal hysterectomy   . Joint replacement     Current Outpatient Prescriptions    Medication Sig Dispense Refill  . Calcium Carbonate-Vit D-Min (CALCIUM 600+D PLUS MINERALS) 600-400 MG-UNIT TABS Take 1 tablet by mouth daily.        . Cholecalciferol (VITAMIN D) 400 UNITS capsule Take 400 Units by mouth daily.        . dabigatran (PRADAXA) 150 MG CAPS Take 1 capsule (150 mg total) by mouth every 12 (twelve) hours.  180 capsule  3  . diclofenac sodium (VOLTAREN) 1 % GEL Apply 1 application topically as needed.       . diltiazem (CARDIZEM CD) 240 MG 24 hr capsule Take 1 capsule (240 mg total) by mouth daily.  90 capsule  3  . fluticasone (FLONASE) 50 MCG/ACT nasal spray USE 2 SPRAYS IN EACH NOSTRIL EVERY DAY  16 g  2  . hydrochlorothiazide (HYDRODIURIL) 25 MG tablet Take 1 tablet (25 mg total) by mouth daily.  90 tablet  3  . levothyroxine (LEVOXYL) 150 MCG tablet Take 175 mcg by mouth daily.       . metoprolol succinate (TOPROL-XL) 25 MG 24 hr tablet Take 1 tablet (25 mg total) by mouth daily.  90 tablet  3  . Multiple Vitamins-Minerals (OCUVITE PO) 1 tab po qd       . Omega-3 Fatty Acids (FISH OIL EXTRA STRENGTH PO) 1 tab  po qd       . potassium chloride SA (K-DUR,KLOR-CON) 20 MEQ tablet Take 20 mEq by mouth daily.      . pravastatin (PRAVACHOL) 20 MG tablet Take 1 tablet (20 mg total) by mouth daily.  90 tablet  3  . propafenone (RYTHMOL) 225 MG tablet Take 1 tablet (225 mg total) by mouth 2 (two) times daily.  180 tablet  3   Current Facility-Administered Medications  Medication Dose Route Frequency Provider Last Rate Last Dose  . TDaP (BOOSTRIX) injection 0.5 mL  0.5 mL Intramuscular Once Elvina Sidle, MD        Allergies  Allergen Reactions  . Erythromycin   . Lisinopril     REACTION: Cough  . Penicillins     REACTION: Swelling, Rash  . Penicillins Cross Reactors     Any meds with mycin in the name    History   Social History  . Marital Status: Widowed    Spouse Name: N/A    Number of Children: N/A  . Years of Education: N/A   Occupational History   . Not on file.   Social History Main Topics  . Smoking status: Former Smoker    Quit date: 09/06/1990  . Smokeless tobacco: Not on file  . Alcohol Use: No  . Drug Use: No  . Sexually Active: Not on file   Other Topics Concern  . Not on file   Social History Narrative  . No narrative on file    Family History  Problem Relation Age of Onset  . Coronary artery disease    . Heart disease Father   . Heart disease Son    Physical Exam: Filed Vitals:   12/26/11 1403  BP: 128/62  Pulse: 61  Resp: 18  Height: 5\' 3"  (1.6 m)  Weight: 201 lb (91.173 kg)  SpO2: 97%    GEN- The patient is overweight appearing, alert and oriented x 3 today.   Head- normocephalic, atraumatic Eyes-  Sclera clear, conjunctiva pink Ears- hearing intact Oropharynx- clear Neck- supple, no JVP Lymph- no cervical lymphadenopathy Lungs- Clear to ausculation bilaterally, normal work of breathing Chest- pacemaker pocket is well healed Heart- Regular rate and rhythm, no murmurs, rubs or gallops, PMI not laterally displaced GI- soft, NT, ND, + BS Extremities- no clubbing, cyanosis, or edema Neuro- strength and sensation are intact  Pacemaker interrogation- reviewed in detail today,  See PACEART report  Assessment and Plan:

## 2012-01-01 ENCOUNTER — Encounter: Payer: Self-pay | Admitting: Family Medicine

## 2012-02-12 DIAGNOSIS — M1710 Unilateral primary osteoarthritis, unspecified knee: Secondary | ICD-10-CM | POA: Diagnosis not present

## 2012-02-12 DIAGNOSIS — IMO0002 Reserved for concepts with insufficient information to code with codable children: Secondary | ICD-10-CM | POA: Diagnosis not present

## 2012-02-20 ENCOUNTER — Telehealth: Payer: Self-pay | Admitting: Cardiology

## 2012-02-20 DIAGNOSIS — M545 Low back pain, unspecified: Secondary | ICD-10-CM | POA: Diagnosis not present

## 2012-02-20 NOTE — Telephone Encounter (Signed)
Pt saw dr Ethelene Hal yesterday, has to have back injections, needs to be off plavix for 5 days, if ok, call [phone]

## 2012-02-21 NOTE — Telephone Encounter (Signed)
02-21-12 dr Ethelene Hal office already made appt for pt for 9-20, needs ok for the plavix asap

## 2012-02-21 NOTE — Telephone Encounter (Signed)
N/A.  LM for Dr Grant Fontana office scheduler.

## 2012-02-21 NOTE — Telephone Encounter (Signed)
Pt not on Plavix but she is on Pradaxa.  It is ok for her to hold her Pradaxa for 5 days prior to her injection per Dr Myrtis Ser.

## 2012-02-28 ENCOUNTER — Other Ambulatory Visit: Payer: Self-pay | Admitting: Cardiology

## 2012-02-28 NOTE — Telephone Encounter (Signed)
New problem:  Need an written Rx . [facility]

## 2012-02-29 DIAGNOSIS — M545 Low back pain, unspecified: Secondary | ICD-10-CM | POA: Diagnosis not present

## 2012-02-29 DIAGNOSIS — M47817 Spondylosis without myelopathy or radiculopathy, lumbosacral region: Secondary | ICD-10-CM | POA: Diagnosis not present

## 2012-02-29 DIAGNOSIS — M5137 Other intervertebral disc degeneration, lumbosacral region: Secondary | ICD-10-CM | POA: Diagnosis not present

## 2012-02-29 DIAGNOSIS — M4310 Spondylolisthesis, site unspecified: Secondary | ICD-10-CM | POA: Diagnosis not present

## 2012-02-29 MED ORDER — FLECAINIDE ACETATE 100 MG PO TABS: 100.0000 mg | ORAL_TABLET | Freq: Two times a day (BID) | ORAL | Status: DC

## 2012-02-29 NOTE — Telephone Encounter (Signed)
Left message --- Dr Myrtis Ser is not in till Monday 23rd so we can have him sign.

## 2012-03-14 DIAGNOSIS — M545 Low back pain, unspecified: Secondary | ICD-10-CM | POA: Diagnosis not present

## 2012-03-31 DIAGNOSIS — L259 Unspecified contact dermatitis, unspecified cause: Secondary | ICD-10-CM | POA: Diagnosis not present

## 2012-03-31 DIAGNOSIS — Z85828 Personal history of other malignant neoplasm of skin: Secondary | ICD-10-CM | POA: Diagnosis not present

## 2012-04-01 DIAGNOSIS — Z23 Encounter for immunization: Secondary | ICD-10-CM | POA: Diagnosis not present

## 2012-04-14 ENCOUNTER — Ambulatory Visit (INDEPENDENT_AMBULATORY_CARE_PROVIDER_SITE_OTHER): Payer: Medicare Other | Admitting: Cardiology

## 2012-04-14 ENCOUNTER — Encounter: Payer: Self-pay | Admitting: Cardiology

## 2012-04-14 VITALS — BP 138/58 | HR 79 | Ht 63.0 in | Wt 202.4 lb

## 2012-04-14 DIAGNOSIS — I495 Sick sinus syndrome: Secondary | ICD-10-CM

## 2012-04-14 DIAGNOSIS — Z950 Presence of cardiac pacemaker: Secondary | ICD-10-CM | POA: Diagnosis not present

## 2012-04-14 DIAGNOSIS — Z79899 Other long term (current) drug therapy: Secondary | ICD-10-CM | POA: Diagnosis not present

## 2012-04-14 MED ORDER — PRAVASTATIN SODIUM 20 MG PO TABS: 20.0000 mg | ORAL_TABLET | Freq: Every day | ORAL | Status: DC

## 2012-04-14 MED ORDER — FLECAINIDE ACETATE 100 MG PO TABS: 100.0000 mg | ORAL_TABLET | Freq: Two times a day (BID) | ORAL | Status: DC

## 2012-04-14 MED ORDER — DABIGATRAN ETEXILATE MESYLATE 150 MG PO CAPS: 150.0000 mg | ORAL_CAPSULE | Freq: Two times a day (BID) | ORAL | Status: DC

## 2012-04-14 MED ORDER — DILTIAZEM HCL ER COATED BEADS 240 MG PO CP24: 240.0000 mg | ORAL_CAPSULE | Freq: Every day | ORAL | Status: DC

## 2012-04-14 MED ORDER — HYDROCHLOROTHIAZIDE 25 MG PO TABS: 25.0000 mg | ORAL_TABLET | Freq: Every day | ORAL | Status: DC

## 2012-04-14 MED ORDER — METOPROLOL SUCCINATE ER 25 MG PO TB24: 25.0000 mg | ORAL_TABLET | Freq: Every day | ORAL | Status: DC

## 2012-04-14 NOTE — Patient Instructions (Addendum)
Your physician recommends that you return for lab work in: when able (tsh and flecainide level)-Do not take your flecainide that morning until after you have your blood drawn.  Your physician wants you to follow-up in: 6 months.  You will receive a reminder letter in the mail two months in advance. If you don't receive a letter, please call our office to schedule the follow-up appointment.

## 2012-04-14 NOTE — Assessment & Plan Note (Signed)
Pacemaker is functioning well. No change in therapy.

## 2012-04-14 NOTE — Assessment & Plan Note (Signed)
Patient continues on pradaxa. She is tolerating this well.

## 2012-04-14 NOTE — Assessment & Plan Note (Signed)
Patient is on flecainide. We will obtain a trough level.

## 2012-04-14 NOTE — Progress Notes (Signed)
HPI   Patient returns today for followup of her bradycardia tachycardia syndrome. She has a pacemaker. She had been changed to flecainide July, 2013. She feels very good with this. She says that she thinks she does have some reactive hypoglycemia. She is careful to snack during the day as needed. She had her pacemaker checked in July, 2013. It was working well. This is when she was changed to flecainide.  Allergies  Allergen Reactions  . Erythromycin   . Lisinopril     REACTION: Cough  . Penicillins     REACTION: Swelling, Rash  . Penicillins Cross Reactors     Any meds with mycin in the name    Current Outpatient Prescriptions  Medication Sig Dispense Refill  . Calcium Carbonate-Vit D-Min (CALCIUM 600+D PLUS MINERALS) 600-400 MG-UNIT TABS Take 1 tablet by mouth daily.        . Cholecalciferol (VITAMIN D) 400 UNITS capsule Take 400 Units by mouth daily.        . dabigatran (PRADAXA) 150 MG CAPS Take 1 capsule (150 mg total) by mouth every 12 (twelve) hours.  180 capsule  3  . diclofenac sodium (VOLTAREN) 1 % GEL Apply 1 application topically as needed.       . diltiazem (CARDIZEM CD) 240 MG 24 hr capsule Take 1 capsule (240 mg total) by mouth daily.  90 capsule  3  . flecainide (TAMBOCOR) 100 MG tablet Take 1 tablet (100 mg total) by mouth 2 (two) times daily.  180 tablet  2  . fluticasone (FLONASE) 50 MCG/ACT nasal spray USE 2 SPRAYS IN EACH NOSTRIL EVERY DAY  16 g  2  . hydrochlorothiazide (HYDRODIURIL) 25 MG tablet Take 1 tablet (25 mg total) by mouth daily.  90 tablet  3  . levothyroxine (LEVOXYL) 150 MCG tablet Take 175 mcg by mouth daily.       . metoprolol succinate (TOPROL-XL) 25 MG 24 hr tablet Take 1 tablet (25 mg total) by mouth daily.  90 tablet  3  . Multiple Vitamins-Minerals (OCUVITE PO) 1 tab po qd       . Omega-3 Fatty Acids (FISH OIL EXTRA STRENGTH PO) 1 tab po qd       . pravastatin (PRAVACHOL) 20 MG tablet Take 1 tablet (20 mg total) by mouth daily.  90 tablet  3     Current Facility-Administered Medications  Medication Dose Route Frequency Provider Last Rate Last Dose  . TDaP (BOOSTRIX) injection 0.5 mL  0.5 mL Intramuscular Once Elvina Sidle, MD        History   Social History  . Marital Status: Widowed    Spouse Name: N/A    Number of Children: N/A  . Years of Education: N/A   Occupational History  . Not on file.   Social History Main Topics  . Smoking status: Former Smoker    Quit date: 09/06/1990  . Smokeless tobacco: Not on file  . Alcohol Use: No  . Drug Use: No  . Sexually Active: Not on file   Other Topics Concern  . Not on file   Social History Narrative  . No narrative on file    Family History  Problem Relation Age of Onset  . Coronary artery disease    . Heart disease Father   . Heart disease Son     Past Medical History  Diagnosis Date  . Brady-tachy syndrome     Pacemaker   Dr.Allred  . Hypothyroidism   . Atrial  fibrillation   . History of hysterectomy   . Drug therapy     Rythmol, atrial fibrillation  . Drug therapy     Pradaxa  (Switch from Coumadin-patient request-March, 2011)  . Pacemaker     Brady tachycardia syndrome  . Cough     Chronic--an ACE inhibitor component  . Hair loss     Patient questioned Coumadin, changed toPradaxa  . Atrial fibrillation     EF 70%, echo, October, 2009, vigorous LV function  /  nuclear November, 2009, no ischemia, shifting breast attenuation  . Cough     March, 2012  . Nausea     Nausea that occurs 2 or 3 hours after eating that is improved with eating a small snack  . Ejection fraction     EF 70%, echo, October, 2009  . Normal nuclear stress test     Nuclear, November, 2009, breast attenuation  . Preop cardiovascular exam     Clearance for her right knee surgery October, 2012  . Back pain   . Heart murmur   . Hypertension     Past Surgical History  Procedure Date  . Pacemaker placement 2009    PPM-Biotronik  . Abdominal hysterectomy   . Joint  replacement     Patient Active Problem List  Diagnosis  . PACEMAKER, PERMANENT  . ESSENTIAL HYPERTENSION, BENIGN  . Brady-tachy syndrome  . Hypothyroidism  . Atrial fibrillation  . Drug therapy  . Drug therapy  . Pacemaker  . Cough  . Nausea  . Ejection fraction  . Normal nuclear stress test  . Preop cardiovascular exam  . Fatigue    ROS  Patient denies fever, chills, headache, sweats, rash, change in vision, change in hearing, chest pain, cough, nausea vomiting, urinary symptoms. All other systems are reviewed and are negative.  PHYSICAL EXAM  Patient is stable. She is overweight. She is oriented to person time and place. Affect is normal. She had her knee replaced in the fall of 2012. This is working well. There is no jugulovenous distention. Lungs are clear. Respiratory effort is nonlabored. Cardiac exam reveals S1 and S2. There no clicks or significant murmurs. The abdomen is soft. There is no peripheral edema.  Filed Vitals:   04/14/12 0927  BP: 138/58  Pulse: 79  Height: 5\' 3"  (1.6 m)  Weight: 202 lb 6.4 oz (91.808 kg)  SpO2: 95%   EKG is done today. She is atrially paced. It is reviewed by me.  ASSESSMENT & PLAN

## 2012-04-26 ENCOUNTER — Ambulatory Visit (INDEPENDENT_AMBULATORY_CARE_PROVIDER_SITE_OTHER): Payer: Medicare Other | Admitting: Family Medicine

## 2012-04-26 VITALS — BP 129/66 | HR 66 | Temp 98.7°F | Resp 16 | Ht 63.5 in | Wt 201.0 lb

## 2012-04-26 DIAGNOSIS — R300 Dysuria: Secondary | ICD-10-CM | POA: Diagnosis not present

## 2012-04-26 DIAGNOSIS — R350 Frequency of micturition: Secondary | ICD-10-CM

## 2012-04-26 DIAGNOSIS — R81 Glycosuria: Secondary | ICD-10-CM

## 2012-04-26 LAB — POCT URINALYSIS DIPSTICK
Bilirubin, UA: NEGATIVE
Blood, UA: NEGATIVE
Ketones, UA: NEGATIVE
Leukocytes, UA: NEGATIVE
Nitrite, UA: POSITIVE
Protein, UA: NEGATIVE
Spec Grav, UA: <=1.005
Urobilinogen, UA: 1
pH, UA: 6

## 2012-04-26 LAB — POCT UA - MICROSCOPIC ONLY
Bacteria, U Microscopic: NEGATIVE
Casts, Ur, LPF, POC: NEGATIVE
Crystals, Ur, HPF, POC: NEGATIVE
Mucus, UA: NEGATIVE
Yeast, UA: NEGATIVE

## 2012-04-26 LAB — GLUCOSE, POCT (MANUAL RESULT ENTRY): POC Glucose: 77 mg/dl (ref 70–99)

## 2012-04-26 MED ORDER — CIPROFLOXACIN HCL 250 MG PO TABS
250.0000 mg | ORAL_TABLET | Freq: Two times a day (BID) | ORAL | Status: DC
Start: 2012-04-26 — End: 2012-07-03

## 2012-04-26 NOTE — Progress Notes (Signed)
Subjective:    Patient ID: [name], female    DOB: [birth year], 78 y.o.   MRN: [id number]  HPI [name] is a 78 y.o. female Dysuria, urgency - starting at 4am.  Small amount of urination then. Burning and sore at urethra.  No fever. No vomiting. Sore in low back, but chronic low back pain.  No known hx of DM.   Primary provider: Dr. Milus Glazier.   Tx: azo otc x1 dose this am.  Helped with urinating.   Review of Systems  Constitutional: Negative for fever and chills.  Gastrointestinal: Positive for abdominal pain. Negative for nausea and vomiting.       Lower abdomen/bladder area  Genitourinary: Positive for dysuria, urgency, frequency and difficulty urinating (initially, but improved after Azo. ). Negative for hematuria, vaginal bleeding, vaginal discharge, vaginal pain, menstrual problem and pelvic pain.  Musculoskeletal: Negative for back pain.       Objective:   Physical Exam  Constitutional: She is oriented to person, place, and time. She appears well-developed and well-nourished.  HENT:  Head: Normocephalic and atraumatic.  Pulmonary/Chest: Effort normal.  Abdominal: Soft. Normal appearance. She exhibits no distension. There is no tenderness. There is no rebound, no guarding and no CVA tenderness.  Neurological: She is alert and oriented to person, place, and time.  Skin: Skin is warm.  Psychiatric: She has a normal mood and affect. Her behavior is normal.   Results for orders placed in visit on 04/26/12  POCT URINALYSIS DIPSTICK      Component Value Range   Color, UA orange     Clarity, UA clear     Glucose, UA 100mg /dL     Bilirubin, UA neg     Ketones, UA neg     Spec Grav, UA <=1.005     Blood, UA neg     pH, UA 6.0     Protein, UA neg     Urobilinogen, UA 1.0     Nitrite, UA positive     Leukocytes, UA Negative    POCT UA - MICROSCOPIC ONLY      Component Value Range   WBC, Ur, HPF, POC 0-1     RBC, urine, microscopic 0-1     Bacteria, U  Microscopic neg     Mucus, UA neg     Epithelial cells, urine per micros 0-1     Crystals, Ur, HPF, POC neg     Casts, Ur, LPF, POC neg     Yeast, UA neg    GLUCOSE, POCT (MANUAL RESULT ENTRY)      Component Value Range   POC Glucose 77  70 - 99 mg/dl        Assessment & Plan:  [name] is a 78 y.o. female 1. Dysuria  POCT urinalysis dipstick, POCT UA - Microscopic Only, Urine culture  2. Urinary frequency  POCT urinalysis dipstick, POCT UA - Microscopic Only, POCT glucose (manual entry), Urine culture  3. Glycosuria  POCT glucose (manual entry)   Possible early UTI by symptoms. Likely azo effect on urine with glucose as serum glucose WNL.. Will treat with cipro 250mg  BID, ok to continue Azo for 2 days, and rtc precautions.  Patient Instructions  Your should receive a call or letter about your lab results within the next week to 10 days.  Return to the clinic or go to the nearest emergency room if any of your symptoms worsen or new symptoms occur.  Urinary Tract Infection Urinary  tract infections (UTIs) can develop anywhere along your urinary tract. Your urinary tract is your body's drainage system for removing wastes and extra water. Your urinary tract includes two kidneys, two ureters, a bladder, and a urethra. Your kidneys are a pair of bean-shaped organs. Each kidney is about the size of your fist. They are located below your ribs, one on each side of your spine. CAUSES Infections are caused by microbes, which are microscopic organisms, including fungi, viruses, and bacteria. These organisms are so small that they can only be seen through a microscope. Bacteria are the microbes that most commonly cause UTIs. SYMPTOMS  Symptoms of UTIs may vary by age and gender of the patient and by the location of the infection. Symptoms in young women typically include a frequent and intense urge to urinate and a painful, burning feeling in the bladder or urethra during urination. Older women  and men are more likely to be tired, shaky, and weak and have muscle aches and abdominal pain. A fever may mean the infection is in your kidneys. Other symptoms of a kidney infection include pain in your back or sides below the ribs, nausea, and vomiting. DIAGNOSIS To diagnose a UTI, your caregiver will ask you about your symptoms. Your caregiver also will ask to provide a urine sample. The urine sample will be tested for bacteria and white blood cells. White blood cells are made by your body to help fight infection. TREATMENT  Typically, UTIs can be treated with medication. Because most UTIs are caused by a bacterial infection, they usually can be treated with the use of antibiotics. The choice of antibiotic and length of treatment depend on your symptoms and the type of bacteria causing your infection. HOME CARE INSTRUCTIONS  If you were prescribed antibiotics, take them exactly as your caregiver instructs you. Finish the medication even if you feel better after you have only taken some of the medication.  Drink enough water and fluids to keep your urine clear or pale yellow.  Avoid caffeine, tea, and carbonated beverages. They tend to irritate your bladder.  Empty your bladder often. Avoid holding urine for long periods of time.  Empty your bladder before and after sexual intercourse.  After a bowel movement, women should cleanse from front to back. Use each tissue only once. SEEK MEDICAL CARE IF:   You have back pain.  You develop a fever.  Your symptoms do not begin to resolve within 3 days. SEEK IMMEDIATE MEDICAL CARE IF:   You have severe back pain or lower abdominal pain.  You develop chills.  You have nausea or vomiting.  You have continued burning or discomfort with urination. MAKE SURE YOU:   Understand these instructions.  Will watch your condition.  Will get help right away if you are not doing well or get worse. Document Released: 03/07/2005 Document Revised:  11/27/2011 Document Reviewed: 07/06/2011 [facility] Patient Information 2013 [place], [place].

## 2012-04-26 NOTE — Patient Instructions (Signed)
Your should receive a call or letter about your lab results within the next week to 10 days.  Return to the clinic or go to the nearest emergency room if any of your symptoms worsen or new symptoms occur.  Urinary Tract Infection Urinary tract infections (UTIs) can develop anywhere along your urinary tract. Your urinary tract is your body's drainage system for removing wastes and extra water. Your urinary tract includes two kidneys, two ureters, a bladder, and a urethra. Your kidneys are a pair of bean-shaped organs. Each kidney is about the size of your fist. They are located below your ribs, one on each side of your spine. CAUSES Infections are caused by microbes, which are microscopic organisms, including fungi, viruses, and bacteria. These organisms are so small that they can only be seen through a microscope. Bacteria are the microbes that most commonly cause UTIs. SYMPTOMS  Symptoms of UTIs may vary by age and gender of the patient and by the location of the infection. Symptoms in young women typically include a frequent and intense urge to urinate and a painful, burning feeling in the bladder or urethra during urination. Older women and men are more likely to be tired, shaky, and weak and have muscle aches and abdominal pain. A fever may mean the infection is in your kidneys. Other symptoms of a kidney infection include pain in your back or sides below the ribs, nausea, and vomiting. DIAGNOSIS To diagnose a UTI, your caregiver will ask you about your symptoms. Your caregiver also will ask to provide a urine sample. The urine sample will be tested for bacteria and white blood cells. White blood cells are made by your body to help fight infection. TREATMENT  Typically, UTIs can be treated with medication. Because most UTIs are caused by a bacterial infection, they usually can be treated with the use of antibiotics. The choice of antibiotic and length of treatment depend on your symptoms and the type  of bacteria causing your infection. HOME CARE INSTRUCTIONS  If you were prescribed antibiotics, take them exactly as your caregiver instructs you. Finish the medication even if you feel better after you have only taken some of the medication.  Drink enough water and fluids to keep your urine clear or pale yellow.  Avoid caffeine, tea, and carbonated beverages. They tend to irritate your bladder.  Empty your bladder often. Avoid holding urine for long periods of time.  Empty your bladder before and after sexual intercourse.  After a bowel movement, women should cleanse from front to back. Use each tissue only once. SEEK MEDICAL CARE IF:   You have back pain.  You develop a fever.  Your symptoms do not begin to resolve within 3 days. SEEK IMMEDIATE MEDICAL CARE IF:   You have severe back pain or lower abdominal pain.  You develop chills.  You have nausea or vomiting.  You have continued burning or discomfort with urination. MAKE SURE YOU:   Understand these instructions.  Will watch your condition.  Will get help right away if you are not doing well or get worse. Document Released: 03/07/2005 Document Revised: 11/27/2011 Document Reviewed: 07/06/2011 [facility] Patient Information 2013 [place], [place].

## 2012-04-27 LAB — URINE CULTURE: Colony Count: 25000

## 2012-05-14 ENCOUNTER — Ambulatory Visit (INDEPENDENT_AMBULATORY_CARE_PROVIDER_SITE_OTHER): Payer: Medicare Other | Admitting: *Deleted

## 2012-05-14 DIAGNOSIS — Z79899 Other long term (current) drug therapy: Secondary | ICD-10-CM

## 2012-05-14 DIAGNOSIS — R0989 Other specified symptoms and signs involving the circulatory and respiratory systems: Secondary | ICD-10-CM

## 2012-05-20 ENCOUNTER — Other Ambulatory Visit: Payer: Self-pay | Admitting: Family Medicine

## 2012-05-20 ENCOUNTER — Telehealth: Payer: Self-pay | Admitting: Radiology

## 2012-05-20 DIAGNOSIS — E039 Hypothyroidism, unspecified: Secondary | ICD-10-CM

## 2012-05-20 MED ORDER — LEVOTHYROXINE SODIUM 125 MCG PO TABS: 125.0000 ug | ORAL_TABLET | Freq: Every day | ORAL | Status: DC

## 2012-05-20 NOTE — Telephone Encounter (Signed)
I have left message for patient, Dr Milus Glazier wants her to decrease her thyroid meds, her dose is too high. I have left her a message to call back about this. Amy

## 2012-05-20 NOTE — Telephone Encounter (Signed)
He also wants to see her back in 2 months for follow up.

## 2012-05-20 NOTE — Progress Notes (Signed)
Thyroid medicine has been reduced.  The dose was shown to be too high.  Recheck in two months

## 2012-05-21 LAB — FLECAINIDE LEVEL: Flecainide: 0.52 ug/mL (ref 0.20–1.00)

## 2012-05-21 NOTE — Telephone Encounter (Signed)
Pt CB and I explained change to lower dose thyroid med and 2 mos f/up. Pt agreed.

## 2012-07-03 ENCOUNTER — Encounter: Payer: Self-pay | Admitting: Internal Medicine

## 2012-07-03 ENCOUNTER — Encounter: Payer: Self-pay | Admitting: Cardiology

## 2012-07-03 ENCOUNTER — Ambulatory Visit (INDEPENDENT_AMBULATORY_CARE_PROVIDER_SITE_OTHER): Payer: Medicare Other | Admitting: Cardiology

## 2012-07-03 VITALS — BP 131/70 | HR 66 | Ht 63.0 in | Wt 203.0 lb

## 2012-07-03 DIAGNOSIS — Z950 Presence of cardiac pacemaker: Secondary | ICD-10-CM | POA: Diagnosis not present

## 2012-07-03 DIAGNOSIS — I495 Sick sinus syndrome: Secondary | ICD-10-CM | POA: Diagnosis not present

## 2012-07-03 DIAGNOSIS — I4891 Unspecified atrial fibrillation: Secondary | ICD-10-CM

## 2012-07-03 LAB — PACEMAKER DEVICE OBSERVATION
AL AMPLITUDE: 1.3 mv
AL IMPEDENCE PM: 536 Ohm
ATRIAL PACING PM: 90
DEVICE MODEL PM: 76104415
RV LEAD AMPLITUDE: 29.9 mv
RV LEAD IMPEDENCE PM: 869 Ohm
VENTRICULAR PACING PM: 58

## 2012-07-03 NOTE — Patient Instructions (Addendum)
Your physician wants you to follow-up in: 6 MONTHS WITH DR. ALLRED. You will receive a reminder letter in the mail two months in advance. If you don't receive a letter, please call our office to schedule the follow-up appointment.  

## 2012-07-03 NOTE — Progress Notes (Signed)
ELECTROPHYSIOLOGY OFFICE NOTE  Patient ID: [name] MRN: [id number], DOB/AGE: 01-09-1928   Date of Visit: 07/03/2012  Primary Physician: Elvina Sidle, MD Primary Cardiologist: Willa Rough, MD Primary EP: [name], MD Reason for Visit: EP/device follow-up  History of Present Illness  [name] is a pleasant 79 year old woman with paroxysmal atrial fibrillation and tachy-brady syndrome s/p PPM who presents today for routine electrophysiology followup. Since last being seen in our clinic, she reports she is doing well. She states her palpitations have improved since Dr. Johney Frame changed her AAD from propafenone to flecainide. She denies chest pain, shortness of breath, orthopnea, PND, lower extremity edema, dizziness, near syncope or syncope. [name] feels that she is tolerating her medication without difficulty and is without complaint today.   Past Medical History Past Medical History  Diagnosis Date  . Brady-tachy syndrome     Pacemaker   Dr.Allred  . Hypothyroidism   . Atrial fibrillation   . History of hysterectomy   . Drug therapy     Rythmol, atrial fibrillation  . Drug therapy     Pradaxa  (Switch from Coumadin-patient request-March, 2011)  . Pacemaker     Brady tachycardia syndrome  . Cough     Chronic--an ACE inhibitor component  . Hair loss     Patient questioned Coumadin, changed toPradaxa  . Atrial fibrillation     EF 70%, echo, October, 2009, vigorous LV function  /  nuclear November, 2009, no ischemia, shifting breast attenuation  . Cough     March, 2012  . Nausea     Nausea that occurs 2 or 3 hours after eating that is improved with eating a small snack  . Ejection fraction     EF 70%, echo, October, 2009  . Normal nuclear stress test     Nuclear, November, 2009, breast attenuation  . Preop cardiovascular exam     Clearance for her right knee surgery October, 2012  . Back pain   . Heart murmur   . Hypertension     Past Surgical History Past  Surgical History  Procedure Date  . Pacemaker placement 2009    PPM-Biotronik  . Abdominal hysterectomy   . Joint replacement      Allergies/Intolerances Allergies  Allergen Reactions  . Erythromycin   . Lisinopril     REACTION: Cough  . Penicillins     REACTION: Swelling, Rash  . Penicillins Cross Reactors     Any meds with mycin in the name    Current Home Medications Current Outpatient Prescriptions  Medication Sig Dispense Refill  . Calcium Carbonate-Vit D-Min (CALCIUM 600+D PLUS MINERALS) 600-400 MG-UNIT TABS Take 1 tablet by mouth daily.        . Cholecalciferol (VITAMIN D) 400 UNITS capsule Take 400 Units by mouth daily.        . dabigatran (PRADAXA) 150 MG CAPS Take 1 capsule (150 mg total) by mouth every 12 (twelve) hours.  180 capsule  3  . diclofenac sodium (VOLTAREN) 1 % GEL Apply 1 application topically as needed.       . diltiazem (CARDIZEM CD) 240 MG 24 hr capsule Take 1 capsule (240 mg total) by mouth daily.  90 capsule  3  . flecainide (TAMBOCOR) 100 MG tablet Take 1 tablet (100 mg total) by mouth 2 (two) times daily.  180 tablet  3  . fluticasone (FLONASE) 50 MCG/ACT nasal spray       . hydrochlorothiazide (HYDRODIURIL) 25 MG tablet Take  1 tablet (25 mg total) by mouth daily.  90 tablet  3  . levothyroxine (SYNTHROID, LEVOTHROID) 125 MCG tablet Take 1 tablet (125 mcg total) by mouth daily.  90 tablet  0  . metoprolol succinate (TOPROL-XL) 25 MG 24 hr tablet Take 1 tablet (25 mg total) by mouth daily.  90 tablet  3  . Multiple Vitamins-Minerals (OCUVITE PO) 1 tab po qd       . Omega-3 Fatty Acids (FISH OIL EXTRA STRENGTH PO) 1 tab po qd       . pravastatin (PRAVACHOL) 20 MG tablet Take 1 tablet (20 mg total) by mouth daily.  90 tablet  3    Social History Social History  . Marital Status: Widowed   Social History Main Topics  . Smoking status: Former Smoker    Quit date: 09/06/1990  . Smokeless tobacco: Not on file  . Alcohol Use: No  . Drug Use: No    Review of Systems General: No chills, fever, night sweats or weight changes Cardiovascular: No chest pain, dyspnea on exertion, edema, orthopnea, palpitations, paroxysmal nocturnal dyspnea Dermatological: No rash, lesions or masses Respiratory: No cough, dyspnea Urologic: No hematuria, dysuria Abdominal: No nausea, vomiting, diarrhea, bright red blood per rectum, melena, or hematemesis Neurologic: No visual changes, weakness, changes in mental status All other systems reviewed and are otherwise negative except as noted above.  Physical Exam Blood pressure 131/70, pulse 66, height 5\' 3"  (1.6 m), weight 203 lb (92.08 kg), SpO2 96.00%.  General: Well developed, well appearing 79 year old female in no acute distress. HEENT: Normocephalic, atraumatic. EOMs intact. Sclera nonicteric. Oropharynx clear.  Neck: Supple. No JVD. Lungs: Respirations regular and unlabored, CTA bilaterally. No wheezes, rales or rhonchi. Heart: RRR. S1, S2 present. No murmurs, rub, S3 or S4. Abdomen: Soft, non-distended.  Extremities: No clubbing, cyanosis or edema.  Psych: Normal affect. Neuro: Alert and oriented X 3. Moves all extremities spontaneously.   Diagnostics Device interrogation performed today by industry shows normal PPM function with good battery status and stable lead measurements/parameters; 57 mode switch episodes, EGMs consistent with atrial fibrillation; AT/AF burden 1% of time; rates overall well controlled; no ventricular arrhythmias recorded; histograms appropriate; no programming changes made; see [employer] report  Assessment and Plan 1. Atrial fibrillation with tachy-brady syndrome s/p PPM Normal device function AT/AF burden improved with flecainide Continue flecainide, diltiazem and metoprolol Continue Pradaxa for embolic prophylaxis Follow-up with Dr. Johney Frame in 6 months  Signed, Rick Duff, PA-C 07/03/2012, 10:46 AM

## 2012-07-10 ENCOUNTER — Ambulatory Visit: Payer: Medicare Other | Admitting: Family Medicine

## 2012-07-24 ENCOUNTER — Ambulatory Visit: Payer: Medicare Other | Admitting: Family Medicine

## 2012-08-06 ENCOUNTER — Encounter: Payer: Self-pay | Admitting: Family Medicine

## 2012-08-06 ENCOUNTER — Ambulatory Visit (INDEPENDENT_AMBULATORY_CARE_PROVIDER_SITE_OTHER): Payer: Medicare Other | Admitting: Family Medicine

## 2012-08-06 VITALS — BP 126/68 | HR 68 | Temp 98.0°F | Resp 16 | Ht 64.5 in | Wt 203.0 lb

## 2012-08-06 DIAGNOSIS — E039 Hypothyroidism, unspecified: Secondary | ICD-10-CM

## 2012-08-06 LAB — TSH: TSH: 0.495 u[IU]/mL (ref 0.350–4.500)

## 2012-08-06 NOTE — Progress Notes (Signed)
S:  This 79 y.o. Cauc female has chronic hypothyroidism, treated with Levothyroxine 175 mcg until Dec 2013 when her dose was reduced to 125 mcg daily. She was advised to get thyroid lab checked 2 months after dose change; she is frustrated that her lab appointment has been rescheduled twice. Pt c/o hair loss and matted hair.  ROS: As per HPI; she has follow-up regularly w/ Dr. Myrtis Ser (cardiology).  O: Filed Vitals:   08/06/12 0959  BP: 126/68  Pulse: 68  Temp: 98 F (36.7 C)  Resp: 16   GEN: In NAD; WN,WD. HENT: [place]/AT; EOMI w/ clear  Conj/ sclerae. Oroph clear and moist. COR: RRR. LUNGS: Normal resp rate and effort. SKIN: W&D. NEURO: A&O x 3; CNs intact. Nonfocal. Pt is not in a pleasant mood.  A/P: Unspecified hypothyroidism - Plan: TSH, T3, Free; continue current medication dose pending lab results.  Pt declines to sch f/u with Dr. Milus Glazier. CPE w/ that M.D. was June 2013.

## 2012-08-06 NOTE — Patient Instructions (Addendum)
Hypothyroidism  Hypothyroidism is a condition due to under activity of the thyroid gland.  CAUSES   Hypothyroidism may be due to thyroid surgery or disease.   SYMPTOMS   Symptoms are often vague. They may include:  · Fatigue.  · Mental dullness.  · Weakness.  · Hoarseness.  · Constipation.  · Swelling in the face, hands or feet.  · Dry skin.  · Hair loss.  · Sensitivity to cold.  · Menstrual problems.  · Patients with hypothyroidism are also more likely to have problems with infections.  DIAGNOSIS   The diagnosis is confirmed by lab tests for levels of thyroid hormones (TSH, T3, and T4). These tests are also used to monitor treatment.  TREATMENT   Treatment includes the gradual replacement of thyroid hormones. It is very important that you take your thyroid medicine as prescribed daily, even after you begin to feel better. You will probably require thyroid medicine for the rest of your life. To reduce constipation, eat a diet high in fruits and vegetables. Try to get some exercise every day. Please see your doctor for follow up care as recommended so your treatment can be adjusted as your condition improves.   SEEK IMMEDIATE MEDICAL CARE IF:   You have chest pain, palpitations, shortness of breath, or any other serious symptoms.  Document Released: 07/05/2004 Document Revised: 08/20/2011 Document Reviewed: 05/28/2005  [employer]® Patient Information ©2013 [employer].

## 2012-08-07 NOTE — Progress Notes (Signed)
Quick Note:  Please notify pt that results are normal.   Provide pt with copy of labs. ______ 

## 2012-08-08 ENCOUNTER — Encounter: Payer: Self-pay | Admitting: *Deleted

## 2012-08-12 ENCOUNTER — Other Ambulatory Visit: Payer: Self-pay | Admitting: Family Medicine

## 2012-10-07 DIAGNOSIS — Z85828 Personal history of other malignant neoplasm of skin: Secondary | ICD-10-CM | POA: Diagnosis not present

## 2012-10-07 DIAGNOSIS — B353 Tinea pedis: Secondary | ICD-10-CM | POA: Diagnosis not present

## 2012-10-24 ENCOUNTER — Ambulatory Visit (INDEPENDENT_AMBULATORY_CARE_PROVIDER_SITE_OTHER): Payer: Medicare Other | Admitting: Cardiology

## 2012-10-24 ENCOUNTER — Encounter: Payer: Self-pay | Admitting: Cardiology

## 2012-10-24 ENCOUNTER — Other Ambulatory Visit: Payer: Self-pay | Admitting: Cardiology

## 2012-10-24 VITALS — BP 128/62 | HR 72 | Ht 63.5 in | Wt 203.0 lb

## 2012-10-24 DIAGNOSIS — Z950 Presence of cardiac pacemaker: Secondary | ICD-10-CM | POA: Diagnosis not present

## 2012-10-24 DIAGNOSIS — E039 Hypothyroidism, unspecified: Secondary | ICD-10-CM | POA: Diagnosis not present

## 2012-10-24 DIAGNOSIS — I4891 Unspecified atrial fibrillation: Secondary | ICD-10-CM

## 2012-10-24 DIAGNOSIS — Z7901 Long term (current) use of anticoagulants: Secondary | ICD-10-CM

## 2012-10-24 LAB — CBC WITH DIFFERENTIAL/PLATELET
Basophils Absolute: 0.1 10*3/uL (ref 0.0–0.1)
HCT: 41.6 % (ref 36.0–46.0)
Hemoglobin: 14.6 g/dL (ref 12.0–15.0)
Lymphocytes Relative: 34 % (ref 12–46)
MCV: 93.3 fL (ref 78.0–100.0)
Neutro Abs: 3.2 10*3/uL (ref 1.7–7.7)
Neutrophils Relative %: 45 % (ref 43–77)
Platelets: 183 10*3/uL (ref 150–400)
RDW: 13.5 % (ref 11.5–15.5)

## 2012-10-24 LAB — BASIC METABOLIC PANEL
CO2: 31 mEq/L (ref 19–32)
Chloride: 99 mEq/L (ref 96–112)
Creat: 1.04 mg/dL (ref 0.50–1.10)
Potassium: 3.7 mEq/L (ref 3.5–5.3)
Sodium: 140 mEq/L (ref 135–145)

## 2012-10-24 NOTE — Patient Instructions (Addendum)
**Note De-identified  Obfuscation** Your physician recommends that you continue on your current medications as directed. Please refer to the Current Medication list given to you today.  Your physician recommends that you return for lab work in: today  Your physician wants you to follow-up in: 6 months. You will receive a reminder letter in the mail two months in advance. If you don't receive a letter, please call our office to schedule the follow-up appointment.   

## 2012-10-24 NOTE — Addendum Note (Signed)
**Note De-Identified [name] Obfuscation** Addended by: Demetrios Loll on: 10/24/2012 04:19 PM   Modules accepted: Orders

## 2012-10-24 NOTE — Assessment & Plan Note (Signed)
At one point her TSH had been quite low. Her thyroid dose was reduced. In February there was followup in the TSH was low normal. We will repeat this lab now. Also I will repeat this in 6 months when I see her back.

## 2012-10-24 NOTE — Assessment & Plan Note (Signed)
Her atrial fib is under good control.  No change in therapy. 

## 2012-10-24 NOTE — Progress Notes (Signed)
HPI  Patient is seen to follow up atrial fibrillation. She's actually doing well. Over time we have adjusted her medicines and most recently she is on flecainide and Pradaxa. She is stable with these. She did fall and injure her right wrist. There was no syncope.  Allergies  Allergen Reactions  . Erythromycin   . Lisinopril     REACTION: Cough  . Penicillins     REACTION: Swelling, Rash  . Penicillins Cross Reactors     Any meds with mycin in the name    Current Outpatient Prescriptions  Medication Sig Dispense Refill  . Calcium Carbonate-Vit D-Min (CALCIUM 600+D PLUS MINERALS) 600-400 MG-UNIT TABS Take 1 tablet by mouth daily.        . Cholecalciferol (VITAMIN D) 400 UNITS capsule Take 400 Units by mouth daily.        . dabigatran (PRADAXA) 150 MG CAPS Take 1 capsule (150 mg total) by mouth every 12 (twelve) hours.  180 capsule  3  . diclofenac sodium (VOLTAREN) 1 % GEL Apply 1 application topically as needed.       . diltiazem (CARDIZEM CD) 240 MG 24 hr capsule Take 1 capsule (240 mg total) by mouth daily.  90 capsule  3  . flecainide (TAMBOCOR) 100 MG tablet Take 1 tablet (100 mg total) by mouth 2 (two) times daily.  180 tablet  3  . fluticasone (FLONASE) 50 MCG/ACT nasal spray       . hydrochlorothiazide (HYDRODIURIL) 25 MG tablet Take 1 tablet (25 mg total) by mouth daily.  90 tablet  3  . levothyroxine (SYNTHROID, LEVOTHROID) 125 MCG tablet TAKE 1 TABLET (125 MCG TOTAL) BY MOUTH DAILY.  90 tablet  0  . metoprolol succinate (TOPROL-XL) 25 MG 24 hr tablet Take 1 tablet (25 mg total) by mouth daily.  90 tablet  3  . Multiple Vitamins-Minerals (OCUVITE PO) 1 tab po qd       . Omega-3 Fatty Acids (FISH OIL EXTRA STRENGTH PO) 1 tab po qd       . pravastatin (PRAVACHOL) 20 MG tablet Take 1 tablet (20 mg total) by mouth daily.  90 tablet  3   No current facility-administered medications for this visit.    History   Social History  . Marital Status: Widowed    Spouse Name: N/A      Number of Children: N/A  . Years of Education: N/A   Occupational History  . Not on file.   Social History Main Topics  . Smoking status: Former Smoker    Quit date: 09/06/1990  . Smokeless tobacco: Not on file  . Alcohol Use: No  . Drug Use: No  . Sexually Active: Not on file   Other Topics Concern  . Not on file   Social History Narrative  . No narrative on file    Family History  Problem Relation Age of Onset  . Coronary artery disease    . Heart disease Father   . Heart disease Son     Past Medical History  Diagnosis Date  . Brady-tachy syndrome     Pacemaker   Dr.Allred  . Hypothyroidism   . Atrial fibrillation   . History of hysterectomy   . Drug therapy     Rythmol, atrial fibrillation  . Drug therapy     Pradaxa  (Switch from Coumadin-patient request-March, 2011)  . Pacemaker     Brady tachycardia syndrome  . Cough     Chronic--an ACE inhibitor  component  . Hair loss     Patient questioned Coumadin, changed toPradaxa  . Atrial fibrillation     EF 70%, echo, October, 2009, vigorous LV function  /  nuclear November, 2009, no ischemia, shifting breast attenuation  . Cough     March, 2012  . Nausea     Nausea that occurs 2 or 3 hours after eating that is improved with eating a small snack  . Ejection fraction     EF 70%, echo, October, 2009  . Normal nuclear stress test     Nuclear, November, 2009, breast attenuation  . Preop cardiovascular exam     Clearance for her right knee surgery October, 2012  . Back pain   . Heart murmur   . Hypertension     Past Surgical History  Procedure Laterality Date  . Pacemaker placement  2009    PPM-Biotronik  . Abdominal hysterectomy    . Joint replacement      Patient Active Problem List   Diagnosis Date Noted  . Preop cardiovascular exam     Priority: High  . Ejection fraction     Priority: High  . Normal nuclear stress test     Priority: High  . Nausea     Priority: High  . Drug therapy      Priority: High  . Drug therapy     Priority: High  . Pacemaker     Priority: High  . Cough     Priority: High  . Fatigue 12/26/2011  . Brady-tachy syndrome   . Hypothyroidism   . Atrial fibrillation   . ESSENTIAL HYPERTENSION, BENIGN 07/20/2010    ROS   Patient denies fever, chills, headache, sweats, rash, change in vision, change in hearing, chest pain, cough, nausea vomiting, urinary symptoms. All other systems are reviewed and are negative.  PHYSICAL EXAM  Patient is oriented to person time and place. Affect is normal. She is overweight. Lungs are clear. Respiratory effort is nonlabored. Cardiac exam reveals S1 and S2. The rhythm is regular. The abdomen is soft. There is no peripheral edema. She has mild swelling at the right wrist where she fell. There is slight ecchymoses.  Filed Vitals:   10/24/12 1534  BP: 128/62  Pulse: 72  Height: 5' 3.5" (1.613 m)  Weight: 203 lb (92.08 kg)     ASSESSMENT & PLAN

## 2012-10-24 NOTE — Addendum Note (Signed)
Addended by: Tonita Phoenix on: 10/24/2012 04:20 PM   Modules accepted: Orders

## 2012-10-24 NOTE — Assessment & Plan Note (Signed)
Her pacemaker is being followed carefully. No change in therapy.

## 2012-10-27 LAB — TSH: TSH: 2.751 u[IU]/mL (ref 0.350–4.500)

## 2012-10-29 ENCOUNTER — Encounter: Payer: Self-pay | Admitting: Cardiology

## 2012-10-29 NOTE — Progress Notes (Signed)
TSH continues up after dose adjusted. Need follow up again 2 months to be sure stable.  Jerral Bonito, MD

## 2012-10-31 ENCOUNTER — Other Ambulatory Visit: Payer: Self-pay

## 2012-10-31 DIAGNOSIS — E039 Hypothyroidism, unspecified: Secondary | ICD-10-CM

## 2012-11-07 ENCOUNTER — Ambulatory Visit: Payer: Medicare Other | Admitting: Cardiology

## 2012-11-17 ENCOUNTER — Other Ambulatory Visit: Payer: Self-pay | Admitting: Physician Assistant

## 2012-12-31 ENCOUNTER — Other Ambulatory Visit (INDEPENDENT_AMBULATORY_CARE_PROVIDER_SITE_OTHER): Payer: Medicare Other

## 2012-12-31 DIAGNOSIS — E039 Hypothyroidism, unspecified: Secondary | ICD-10-CM | POA: Diagnosis not present

## 2012-12-31 LAB — TSH: TSH: 1.53 u[IU]/mL (ref 0.35–5.50)

## 2013-01-20 ENCOUNTER — Telehealth: Payer: Self-pay | Admitting: Cardiology

## 2013-01-20 MED ORDER — LEVOTHYROXINE SODIUM 125 MCG PO TABS: 125.0000 ug | ORAL_TABLET | Freq: Every day | ORAL | Status: DC

## 2013-01-20 NOTE — Telephone Encounter (Signed)
**Note De-Identified  Obfuscation** Ok to refill Levothyroxine per Dr Myrtis Ser. RX sent to [facility] to fill, pt aware.

## 2013-01-20 NOTE — Telephone Encounter (Signed)
New prob  Pt would like to speak with you regarding her medication.

## 2013-01-24 ENCOUNTER — Other Ambulatory Visit: Payer: Self-pay | Admitting: Physician Assistant

## 2013-02-06 DIAGNOSIS — H18419 Arcus senilis, unspecified eye: Secondary | ICD-10-CM | POA: Diagnosis not present

## 2013-02-06 DIAGNOSIS — H25049 Posterior subcapsular polar age-related cataract, unspecified eye: Secondary | ICD-10-CM | POA: Diagnosis not present

## 2013-02-06 DIAGNOSIS — H2510 Age-related nuclear cataract, unspecified eye: Secondary | ICD-10-CM | POA: Diagnosis not present

## 2013-02-06 DIAGNOSIS — H02839 Dermatochalasis of unspecified eye, unspecified eyelid: Secondary | ICD-10-CM | POA: Diagnosis not present

## 2013-02-06 DIAGNOSIS — H25019 Cortical age-related cataract, unspecified eye: Secondary | ICD-10-CM | POA: Diagnosis not present

## 2013-02-06 DIAGNOSIS — Z961 Presence of intraocular lens: Secondary | ICD-10-CM | POA: Diagnosis not present

## 2013-02-13 ENCOUNTER — Telehealth: Payer: Self-pay | Admitting: Cardiology

## 2013-02-13 NOTE — Telephone Encounter (Signed)
Received request from Nurse, documents faxed for surgical clearance. To: [facility]  Fax number: [phone] Attention: 02/13/13/km

## 2013-02-25 ENCOUNTER — Encounter: Payer: Self-pay | Admitting: Internal Medicine

## 2013-02-25 ENCOUNTER — Ambulatory Visit (INDEPENDENT_AMBULATORY_CARE_PROVIDER_SITE_OTHER): Payer: Medicare Other | Admitting: Internal Medicine

## 2013-02-25 VITALS — BP 135/69 | HR 68 | Ht 63.0 in | Wt 205.4 lb

## 2013-02-25 DIAGNOSIS — I495 Sick sinus syndrome: Secondary | ICD-10-CM | POA: Diagnosis not present

## 2013-02-25 DIAGNOSIS — I4891 Unspecified atrial fibrillation: Secondary | ICD-10-CM

## 2013-02-25 DIAGNOSIS — Z950 Presence of cardiac pacemaker: Secondary | ICD-10-CM | POA: Diagnosis not present

## 2013-02-25 NOTE — Progress Notes (Signed)
Primary Cardiologist:  Dr Myrtis Ser PCP: Elvina Sidle, MD   The patient presents today for routine electrophysiology followup.  Since last being seen in our clinic, the patient reports doing well.  Her afib has been controlled with flecainide.   Today, she denies symptoms of palpitations, chest pain, shortness of breath, orthopnea, PND, lower extremity edema, dizziness, presyncope, syncope, or neurologic sequela.  The patient feels that she is tolerating medications without difficulties and is otherwise without complaint today.   Past Medical History  Diagnosis Date  . Brady-tachy syndrome     Pacemaker   Dr.Reagann Dolce  . Hypothyroidism   . Atrial fibrillation   . History of hysterectomy   . Drug therapy     Rythmol, atrial fibrillation  . Drug therapy     Pradaxa  (Switch from Coumadin-patient request-March, 2011)  . Pacemaker     Brady tachycardia syndrome  . Cough     Chronic--an ACE inhibitor component  . Hair loss     Patient questioned Coumadin, changed toPradaxa  . Atrial fibrillation     EF 70%, echo, October, 2009, vigorous LV function  /  nuclear November, 2009, no ischemia, shifting breast attenuation  . Cough     March, 2012  . Nausea     Nausea that occurs 2 or 3 hours after eating that is improved with eating a small snack  . Ejection fraction     EF 70%, echo, October, 2009  . Normal nuclear stress test     Nuclear, November, 2009, breast attenuation  . Preop cardiovascular exam     Clearance for her right knee surgery October, 2012  . Back pain   . Heart murmur   . Hypertension    Past Surgical History  Procedure Laterality Date  . Pacemaker placement  2009    PPM-Biotronik  . Abdominal hysterectomy    . Joint replacement      Current Outpatient Prescriptions  Medication Sig Dispense Refill  . Calcium Carbonate-Vit D-Min (CALCIUM 600+D PLUS MINERALS) 600-400 MG-UNIT TABS Take 1 tablet by mouth daily.        . Cholecalciferol (VITAMIN D) 400 UNITS capsule  Take 400 Units by mouth daily.        . dabigatran (PRADAXA) 150 MG CAPS Take 1 capsule (150 mg total) by mouth every 12 (twelve) hours.  180 capsule  3  . diclofenac sodium (VOLTAREN) 1 % GEL Apply 1 application topically as needed.       . diltiazem (CARDIZEM CD) 240 MG 24 hr capsule Take 1 capsule (240 mg total) by mouth daily.  90 capsule  3  . flecainide (TAMBOCOR) 100 MG tablet Take 1 tablet (100 mg total) by mouth 2 (two) times daily.  180 tablet  3  . fluticasone (FLONASE) 50 MCG/ACT nasal spray       . hydrochlorothiazide (HYDRODIURIL) 25 MG tablet Take 1 tablet (25 mg total) by mouth daily.  90 tablet  3  . levothyroxine (SYNTHROID, LEVOTHROID) 125 MCG tablet Take 1 tablet (125 mcg total) by mouth daily before breakfast.  90 tablet  0  . metoprolol succinate (TOPROL-XL) 25 MG 24 hr tablet Take 1 tablet (25 mg total) by mouth daily.  90 tablet  3  . Multiple Vitamins-Minerals (OCUVITE PO) 1 tab po qd       . Omega-3 Fatty Acids (FISH OIL EXTRA STRENGTH PO) 1 tab po qd       . pravastatin (PRAVACHOL) 20 MG tablet Take 1 tablet (20 mg  total) by mouth daily.  90 tablet  3   No current facility-administered medications for this visit.    Allergies  Allergen Reactions  . Erythromycin   . Lisinopril     REACTION: Cough  . Penicillins     REACTION: Swelling, Rash  . Penicillins Cross Reactors     Any meds with mycin in the name    History   Social History  . Marital Status: Widowed    Spouse Name: N/A    Number of Children: N/A  . Years of Education: N/A   Occupational History  . Not on file.   Social History Main Topics  . Smoking status: Former Smoker    Quit date: 09/06/1990  . Smokeless tobacco: Not on file  . Alcohol Use: No  . Drug Use: No  . Sexual Activity: Not on file   Other Topics Concern  . Not on file   Social History Narrative  . No narrative on file    Family History  Problem Relation Age of Onset  . Coronary artery disease    . Heart disease  Father   . Heart disease Son    Physical Exam: Filed Vitals:   02/25/13 0932  BP: 135/69  Pulse: 68  Height: 5\' 3"  (1.6 m)  Weight: 205 lb 6.4 oz (93.169 kg)    GEN- The patient is overweight appearing, alert and oriented x 3 today.   Head- normocephalic, atraumatic Eyes-  Sclera clear, conjunctiva pink Ears- hearing intact Oropharynx- clear Neck- supple, no JVP Lymph- no cervical lymphadenopathy Lungs- Clear to ausculation bilaterally, normal work of breathing Chest- pacemaker pocket is well healed Heart- Regular rate and rhythm, no murmurs, rubs or gallops, PMI not laterally displaced GI- soft, NT, ND, + BS Extremities- no clubbing, cyanosis, or edema Neuro- strength and sensation are intact  Pacemaker interrogation- reviewed in detail today,  See PACEART report  Assessment and Plan:  1. Tachy/bradycardia syndrome Normal pacemaker function See Pace Art report No changes today  2. Afib Well controlled Labs 5/14 are reviewed CrCL 60 at that time Dr Myrtis Ser to continue to follow  Return to the device clinic in 6 months I will see in a year

## 2013-02-25 NOTE — Patient Instructions (Addendum)
Your physician wants you to follow-up in: 6 months with device clinic and 12 months with Dr Allred You will receive a reminder letter in the mail two months in advance. If you don't receive a letter, please call our office to schedule the follow-up appointment.  

## 2013-02-26 LAB — PACEMAKER DEVICE OBSERVATION
AL AMPLITUDE: 1 mv
AL THRESHOLD: 0.8 V
BAMS-0001: 160 {beats}/min
DEVICE MODEL PM: 76104415
RV LEAD AMPLITUDE: 25.8 mv
RV LEAD THRESHOLD: 1.1 V

## 2013-03-20 DIAGNOSIS — H2510 Age-related nuclear cataract, unspecified eye: Secondary | ICD-10-CM | POA: Diagnosis not present

## 2013-03-20 DIAGNOSIS — H269 Unspecified cataract: Secondary | ICD-10-CM | POA: Diagnosis not present

## 2013-04-02 DIAGNOSIS — Z23 Encounter for immunization: Secondary | ICD-10-CM | POA: Diagnosis not present

## 2013-04-24 ENCOUNTER — Encounter: Payer: Self-pay | Admitting: Cardiology

## 2013-04-24 ENCOUNTER — Ambulatory Visit (INDEPENDENT_AMBULATORY_CARE_PROVIDER_SITE_OTHER): Payer: Medicare Other | Admitting: Cardiology

## 2013-04-24 VITALS — BP 130/70 | HR 69 | Ht 63.0 in | Wt 203.0 lb

## 2013-04-24 DIAGNOSIS — I495 Sick sinus syndrome: Secondary | ICD-10-CM | POA: Diagnosis not present

## 2013-04-24 DIAGNOSIS — Z79899 Other long term (current) drug therapy: Secondary | ICD-10-CM | POA: Diagnosis not present

## 2013-04-24 DIAGNOSIS — E039 Hypothyroidism, unspecified: Secondary | ICD-10-CM | POA: Diagnosis not present

## 2013-04-24 DIAGNOSIS — Z950 Presence of cardiac pacemaker: Secondary | ICD-10-CM | POA: Diagnosis not present

## 2013-04-24 LAB — CBC WITH DIFFERENTIAL/PLATELET
Basophils Relative: 0.5 % (ref 0.0–3.0)
Eosinophils Absolute: 0.5 10*3/uL (ref 0.0–0.7)
MCHC: 34.5 g/dL (ref 30.0–36.0)
MCV: 94.7 fl (ref 78.0–100.0)
Monocytes Absolute: 1.1 10*3/uL — ABNORMAL HIGH (ref 0.1–1.0)
Neutrophils Relative %: 58.1 % (ref 43.0–77.0)
Platelets: 218 10*3/uL (ref 150.0–400.0)
RBC: 4.49 Mil/uL (ref 3.87–5.11)
RDW: 12.7 % (ref 11.5–14.6)

## 2013-04-24 LAB — BASIC METABOLIC PANEL
BUN: 15 mg/dL (ref 6–23)
GFR: 65.98 mL/min (ref >60.00)
Potassium: 3.5 mEq/L (ref 3.5–5.1)

## 2013-04-24 LAB — TSH: TSH: 0.84 u[IU]/mL (ref 0.35–5.50)

## 2013-04-24 MED ORDER — DABIGATRAN ETEXILATE MESYLATE 150 MG PO CAPS: 150.0000 mg | ORAL_CAPSULE | Freq: Two times a day (BID) | ORAL | Status: DC

## 2013-04-24 MED ORDER — FLECAINIDE ACETATE 100 MG PO TABS: 100.0000 mg | ORAL_TABLET | Freq: Two times a day (BID) | ORAL | Status: DC

## 2013-04-24 MED ORDER — DILTIAZEM HCL ER COATED BEADS 240 MG PO CP24: 240.0000 mg | ORAL_CAPSULE | Freq: Every day | ORAL | Status: DC

## 2013-04-24 MED ORDER — PRAVASTATIN SODIUM 20 MG PO TABS: 20.0000 mg | ORAL_TABLET | Freq: Every day | ORAL | Status: DC

## 2013-04-24 MED ORDER — HYDROCHLOROTHIAZIDE 25 MG PO TABS: 25.0000 mg | ORAL_TABLET | Freq: Every day | ORAL | Status: DC

## 2013-04-24 MED ORDER — LEVOTHYROXINE SODIUM 125 MCG PO TABS: 125.0000 ug | ORAL_TABLET | Freq: Every day | ORAL | Status: DC

## 2013-04-24 MED ORDER — METOPROLOL SUCCINATE ER 25 MG PO TB24: 25.0000 mg | ORAL_TABLET | Freq: Every day | ORAL | Status: DC

## 2013-04-24 NOTE — Assessment & Plan Note (Signed)
She remains on pradaxa. We will check blood tests today to be sure she is stable at 6 months.

## 2013-04-24 NOTE — Progress Notes (Signed)
HPI  Patient is seen today to followup her history of bradycardia tachycardia syndrome. I saw her last in May, 2014. She's been stable. Her pacemaker was assessed in September, 2014. It was working well. She's doing well. She's not having palpitations or shortness of breath.  Allergies  Allergen Reactions  . Erythromycin   . Lisinopril     REACTION: Cough  . Penicillins     REACTION: Swelling, Rash  . Penicillins Cross Reactors     Any meds with mycin in the name    Current Outpatient Prescriptions  Medication Sig Dispense Refill  . Calcium Carbonate-Vit D-Min (CALCIUM 600+D PLUS MINERALS) 600-400 MG-UNIT TABS Take 1 tablet by mouth daily.        . Cholecalciferol (VITAMIN D) 400 UNITS capsule Take 400 Units by mouth daily.        . dabigatran (PRADAXA) 150 MG CAPS Take 1 capsule (150 mg total) by mouth every 12 (twelve) hours.  180 capsule  3  . diclofenac sodium (VOLTAREN) 1 % GEL Apply 1 application topically as needed.       . diltiazem (CARDIZEM CD) 240 MG 24 hr capsule Take 1 capsule (240 mg total) by mouth daily.  90 capsule  3  . flecainide (TAMBOCOR) 100 MG tablet Take 1 tablet (100 mg total) by mouth 2 (two) times daily.  180 tablet  3  . fluticasone (FLONASE) 50 MCG/ACT nasal spray       . hydrochlorothiazide (HYDRODIURIL) 25 MG tablet Take 1 tablet (25 mg total) by mouth daily.  90 tablet  3  . levothyroxine (SYNTHROID, LEVOTHROID) 125 MCG tablet Take 1 tablet (125 mcg total) by mouth daily before breakfast.  90 tablet  0  . metoprolol succinate (TOPROL-XL) 25 MG 24 hr tablet Take 1 tablet (25 mg total) by mouth daily.  90 tablet  3  . Multiple Vitamins-Minerals (OCUVITE PO) 1 tab po qd       . Omega-3 Fatty Acids (FISH OIL EXTRA STRENGTH PO) 1 tab po qd       . pravastatin (PRAVACHOL) 20 MG tablet Take 1 tablet (20 mg total) by mouth daily.  90 tablet  3   No current facility-administered medications for this visit.    History   Social History  . Marital Status:  Widowed    Spouse Name: N/A    Number of Children: N/A  . Years of Education: N/A   Occupational History  . Not on file.   Social History Main Topics  . Smoking status: Former Smoker    Quit date: 09/06/1990  . Smokeless tobacco: Not on file  . Alcohol Use: No  . Drug Use: No  . Sexual Activity: Not on file   Other Topics Concern  . Not on file   Social History Narrative  . No narrative on file    Family History  Problem Relation Age of Onset  . Coronary artery disease    . Heart disease Father   . Heart disease Son     Past Medical History  Diagnosis Date  . Brady-tachy syndrome     Pacemaker   Dr.Allred  . Hypothyroidism   . Atrial fibrillation   . History of hysterectomy   . Drug therapy     Rythmol, atrial fibrillation  . Drug therapy     Pradaxa  (Switch from Coumadin-patient request-March, 2011)  . Pacemaker     Brady tachycardia syndrome  . Cough     Chronic--an ACE  inhibitor component  . Hair loss     Patient questioned Coumadin, changed toPradaxa  . Atrial fibrillation     EF 70%, echo, October, 2009, vigorous LV function  /  nuclear November, 2009, no ischemia, shifting breast attenuation  . Cough     March, 2012  . Nausea     Nausea that occurs 2 or 3 hours after eating that is improved with eating a small snack  . Ejection fraction     EF 70%, echo, October, 2009  . Normal nuclear stress test     Nuclear, November, 2009, breast attenuation  . Preop cardiovascular exam     Clearance for her right knee surgery October, 2012  . Back pain   . Heart murmur   . Hypertension     Past Surgical History  Procedure Laterality Date  . Pacemaker placement  2009    PPM-Biotronik  . Abdominal hysterectomy    . Joint replacement      Patient Active Problem List   Diagnosis Date Noted  . Preop cardiovascular exam     Priority: High  . Ejection fraction     Priority: High  . Normal nuclear stress test     Priority: High  . Nausea      Priority: High  . Drug therapy     Priority: High  . Drug therapy     Priority: High  . Pacemaker     Priority: High  . Cough     Priority: High  . Fatigue 12/26/2011  . Brady-tachy syndrome   . Hypothyroidism   . Atrial fibrillation   . ESSENTIAL HYPERTENSION, BENIGN 07/20/2010    ROS   Patient denies fever, chills, headache, sweats, rash, change in vision, change in hearing, chest pain, cough, nausea vomiting, urinary symptoms. All other systems are reviewed and are negative.  PHYSICAL EXAM  She is overweight. She is stable. She is oriented to person time and place. Affect is normal. There is no jugulovenous distention. Lungs are clear. Respiratory effort is nonlabored. Cardiac exam reveals S1 and S2. There are no clicks or significant murmurs. The abdomen is soft. There is no peripheral edema.  Filed Vitals:   04/24/13 0906  BP: 130/70  Pulse: 69  Height: 5\' 3"  (1.6 m)  Weight: 203 lb (92.08 kg)   EKG is done today and reviewed by me. There appears to be some normally conducted beats. There is also question of some pacing. This is stable.  ASSESSMENT & PLAN

## 2013-04-24 NOTE — Assessment & Plan Note (Signed)
Pacemaker works well. She's not having any tachycardia symptoms. No change in therapy.

## 2013-04-24 NOTE — Assessment & Plan Note (Signed)
It is appropriate to check a TSH today to be sure that her TSH is remaining in the normal range.

## 2013-04-24 NOTE — Patient Instructions (Signed)
**Note De-identified  Obfuscation** Your physician recommends that you continue on your current medications as directed. Please refer to the Current Medication list given to you today.  Your physician recommends that you return for lab work in: today  Your physician wants you to follow-up in: 6 months. You will receive a reminder letter in the mail two months in advance. If you don't receive a letter, please call our office to schedule the follow-up appointment.   

## 2013-04-24 NOTE — Assessment & Plan Note (Signed)
Pacemaker is working well. No change in therapy. 

## 2013-04-24 NOTE — Assessment & Plan Note (Signed)
We know that she had a good flecainide level in November, 2013. It does not need to be repeated today.

## 2013-04-29 ENCOUNTER — Telehealth: Payer: Self-pay | Admitting: Cardiology

## 2013-04-29 DIAGNOSIS — E039 Hypothyroidism, unspecified: Secondary | ICD-10-CM

## 2013-04-29 NOTE — Telephone Encounter (Signed)
Pt given lab results, she verbalized understanding. TSH scheduled to be drawn on 2/16.

## 2013-04-29 NOTE — Telephone Encounter (Signed)
Follow Up  ° °Pt returned call// please call  °

## 2013-05-18 ENCOUNTER — Other Ambulatory Visit: Payer: Self-pay | Admitting: Cardiology

## 2013-05-18 NOTE — Telephone Encounter (Signed)
[name], did you provide patient a printed rx? If not it probably defaulted to print, just let me know and I can sent it electronically. Thanks, MI

## 2013-07-27 ENCOUNTER — Other Ambulatory Visit: Payer: Medicare Other

## 2013-08-03 ENCOUNTER — Other Ambulatory Visit (INDEPENDENT_AMBULATORY_CARE_PROVIDER_SITE_OTHER): Payer: Medicare Other

## 2013-08-03 DIAGNOSIS — E039 Hypothyroidism, unspecified: Secondary | ICD-10-CM

## 2013-08-03 LAB — TSH: TSH: 1.27 u[IU]/mL (ref 0.35–5.50)

## 2013-08-26 ENCOUNTER — Ambulatory Visit (INDEPENDENT_AMBULATORY_CARE_PROVIDER_SITE_OTHER): Payer: Medicare Other | Admitting: *Deleted

## 2013-08-26 ENCOUNTER — Encounter: Payer: Self-pay | Admitting: Internal Medicine

## 2013-08-26 DIAGNOSIS — I4891 Unspecified atrial fibrillation: Secondary | ICD-10-CM | POA: Diagnosis not present

## 2013-08-26 DIAGNOSIS — I495 Sick sinus syndrome: Secondary | ICD-10-CM | POA: Diagnosis not present

## 2013-08-26 LAB — MDC_IDC_ENUM_SESS_TYPE_INCLINIC
Battery Impedance: 2400 Ohm
Battery Voltage: 2.72 V
Brady Statistic RA Percent Paced: 13 %
Date Time Interrogation Session: 20150318133756
Lead Channel Impedance Value: 549 Ohm
Lead Channel Pacing Threshold Amplitude: 0.8 V
Lead Channel Pacing Threshold Pulse Width: 0.4 ms
Lead Channel Setting Pacing Amplitude: 2 V
Lead Channel Setting Pacing Amplitude: 2.4 V
Lead Channel Setting Pacing Pulse Width: 0.4 ms
Lead Channel Setting Sensing Sensitivity: 2.5 mV
MDC IDC MSMT LEADCHNL RA PACING THRESHOLD AMPLITUDE: 0.8 V
MDC IDC MSMT LEADCHNL RA PACING THRESHOLD PULSEWIDTH: 0.4 ms
MDC IDC MSMT LEADCHNL RA SENSING INTR AMPL: 1.1 mV
MDC IDC MSMT LEADCHNL RV IMPEDANCE VALUE: 1120 Ohm
MDC IDC MSMT LEADCHNL RV SENSING INTR AMPL: 28.9 mV
MDC IDC PG SERIAL: 76104415
MDC IDC STAT BRADY RV PERCENT PACED: 94 %

## 2013-08-28 NOTE — Progress Notes (Signed)
Pacemaker check in clinic (industry checked). Normal device function. Thresholds, sensing, impedances consistent with previous measurements. Device programmed to maximize longevity. 168 mode switches---max dur. 37 mins, Max rate 144---AFL+Pradaxa. 1 high ventricular rate noted x 4 sec on 11-2 @ 182bpm. Device programmed at appropriate safety margins. Histogram distribution appropriate for patient activity level. Device programmed to optimize intrinsic conduction. Estimated longevity 2 years. Patient will follow up with JA in 6 months.

## 2013-10-07 DIAGNOSIS — L821 Other seborrheic keratosis: Secondary | ICD-10-CM | POA: Diagnosis not present

## 2013-10-07 DIAGNOSIS — Z85828 Personal history of other malignant neoplasm of skin: Secondary | ICD-10-CM | POA: Diagnosis not present

## 2013-10-27 ENCOUNTER — Ambulatory Visit (INDEPENDENT_AMBULATORY_CARE_PROVIDER_SITE_OTHER): Payer: Medicare Other | Admitting: Cardiology

## 2013-10-27 ENCOUNTER — Encounter: Payer: Self-pay | Admitting: Cardiology

## 2013-10-27 VITALS — BP 130/70 | HR 64 | Ht 63.0 in | Wt 205.0 lb

## 2013-10-27 DIAGNOSIS — Z79899 Other long term (current) drug therapy: Secondary | ICD-10-CM | POA: Diagnosis not present

## 2013-10-27 DIAGNOSIS — I10 Essential (primary) hypertension: Secondary | ICD-10-CM

## 2013-10-27 DIAGNOSIS — E039 Hypothyroidism, unspecified: Secondary | ICD-10-CM

## 2013-10-27 DIAGNOSIS — I4891 Unspecified atrial fibrillation: Secondary | ICD-10-CM

## 2013-10-27 DIAGNOSIS — I495 Sick sinus syndrome: Secondary | ICD-10-CM

## 2013-10-27 LAB — CBC WITH DIFFERENTIAL/PLATELET
BASOS ABS: 0 10*3/uL (ref 0.0–0.1)
Basophils Relative: 0.6 % (ref 0.0–3.0)
EOS PCT: 5.9 % — AB (ref 0.0–5.0)
Eosinophils Absolute: 0.4 10*3/uL (ref 0.0–0.7)
HEMATOCRIT: 42.9 % (ref 36.0–46.0)
Hemoglobin: 14.5 g/dL (ref 12.0–15.0)
LYMPHS ABS: 2 10*3/uL (ref 0.7–4.0)
Lymphocytes Relative: 28.2 % (ref 12.0–46.0)
MCHC: 33.7 g/dL (ref 30.0–36.0)
MCV: 97.2 fl (ref 78.0–100.0)
Monocytes Absolute: 0.9 10*3/uL (ref 0.1–1.0)
Monocytes Relative: 12.3 % — ABNORMAL HIGH (ref 3.0–12.0)
NEUTROS PCT: 53 % (ref 43.0–77.0)
Neutro Abs: 3.8 10*3/uL (ref 1.4–7.7)
PLATELETS: 192 10*3/uL (ref 150.0–400.0)
RBC: 4.42 Mil/uL (ref 3.87–5.11)
RDW: 12.6 % (ref 11.5–15.5)
WBC: 7.1 10*3/uL (ref 4.0–10.5)

## 2013-10-27 LAB — BASIC METABOLIC PANEL
BUN: 17 mg/dL (ref 6–23)
CALCIUM: 9.4 mg/dL (ref 8.4–10.5)
CO2: 29 meq/L (ref 19–32)
Chloride: 99 mEq/L (ref 96–112)
Creatinine, Ser: 1 mg/dL (ref 0.4–1.2)
GFR: 58.13 mL/min — ABNORMAL LOW (ref >60.00)
Glucose, Bld: 84 mg/dL (ref 70–99)
POTASSIUM: 3.5 meq/L (ref 3.5–5.1)
SODIUM: 137 meq/L (ref 135–145)

## 2013-10-27 NOTE — Progress Notes (Signed)
Patient ID: [name], female   DOB: 04-05-1928, 80 y.o.   MRN: [id number]    HPI  Patient is here to followup Brady tachycardia syndrome. She's actually doing very well. She tells me that when she was in a rehabilitation center recently there was some type of problem with the medications that she could get there. Usually she gets her medicines from the [place]. Ultimately she was sent home on day 11 from the rehabilitation center. She thinks she may have been sent home on the early side because they could not obtain her medicines. Regardless of the details, she is totally fine. I told her that if she needs rehabilitation in the future, we can look into helping with the medication adjustments. She's not having any significant palpitations.  Allergies  Allergen Reactions  . Erythromycin   . Lisinopril     REACTION: Cough  . Morphine And Related     Patient states that she went "crazy" with this  . Penicillins     REACTION: Swelling, Rash  . Penicillins Cross Reactors     Any meds with mycin in the name    Current Outpatient Prescriptions  Medication Sig Dispense Refill  . Calcium Carbonate-Vit D-Min (CALCIUM 600+D PLUS MINERALS) 600-400 MG-UNIT TABS Take 1 tablet by mouth daily.        . Cholecalciferol (VITAMIN D) 400 UNITS capsule Take 400 Units by mouth daily.        . dabigatran (PRADAXA) 150 MG CAPS capsule Take 1 capsule (150 mg total) by mouth every 12 (twelve) hours.  180 capsule  3  . diclofenac sodium (VOLTAREN) 1 % GEL Apply 1 application topically as needed.       . diltiazem (CARDIZEM CD) 240 MG 24 hr capsule Take 1 capsule (240 mg total) by mouth daily.  90 capsule  3  . flecainide (TAMBOCOR) 100 MG tablet Take 1 tablet (100 mg total) by mouth 2 (two) times daily.  180 tablet  3  . fluticasone (FLONASE) 50 MCG/ACT nasal spray       . hydrochlorothiazide (HYDRODIURIL) 25 MG tablet Take 1 tablet (25 mg total) by mouth daily.  90 tablet  3  . levothyroxine (SYNTHROID, LEVOTHROID)  125 MCG tablet Take 1 tablet (125 mcg total) by mouth daily.  90 tablet  3  . metoprolol succinate (TOPROL-XL) 25 MG 24 hr tablet Take 1 tablet (25 mg total) by mouth daily.  90 tablet  3  . Multiple Vitamins-Minerals (OCUVITE PO) 1 tab po qd       . Omega-3 Fatty Acids (FISH OIL EXTRA STRENGTH PO) 1 tab po qd       . pravastatin (PRAVACHOL) 20 MG tablet Take 1 tablet (20 mg total) by mouth daily.  90 tablet  3   No current facility-administered medications for this visit.    History   Social History  . Marital Status: Widowed    Spouse Name: N/A    Number of Children: N/A  . Years of Education: N/A   Occupational History  . Not on file.   Social History Main Topics  . Smoking status: Former Smoker    Quit date: 09/06/1990  . Smokeless tobacco: Not on file  . Alcohol Use: No  . Drug Use: No  . Sexual Activity: Not on file   Other Topics Concern  . Not on file   Social History Narrative  . No narrative on file    Family History  Problem Relation Age of  Onset  . Coronary artery disease    . Heart disease Father   . Heart disease Son     Past Medical History  Diagnosis Date  . Brady-tachy syndrome     Pacemaker   Dr.Allred  . Hypothyroidism   . Atrial fibrillation   . History of hysterectomy   . Drug therapy     Rythmol, atrial fibrillation  . Drug therapy     Pradaxa  (Switch from Coumadin-patient request-March, 2011)  . Pacemaker     Brady tachycardia syndrome  . Cough     Chronic--an ACE inhibitor component  . Hair loss     Patient questioned Coumadin, changed toPradaxa  . Atrial fibrillation     EF 70%, echo, October, 2009, vigorous LV function  /  nuclear November, 2009, no ischemia, shifting breast attenuation  . Cough     March, 2012  . Nausea     Nausea that occurs 2 or 3 hours after eating that is improved with eating a small snack  . Ejection fraction     EF 70%, echo, October, 2009  . Normal nuclear stress test     Nuclear, November, 2009,  breast attenuation  . Preop cardiovascular exam     Clearance for her right knee surgery October, 2012  . Back pain   . Heart murmur   . Hypertension     Past Surgical History  Procedure Laterality Date  . Pacemaker placement  2009    PPM-Biotronik  . Abdominal hysterectomy    . Joint replacement      Patient Active Problem List   Diagnosis Date Noted  . Ejection fraction     Priority: High  . Normal nuclear stress test     Priority: High  . Nausea     Priority: High  . Drug therapy     Priority: High  . Drug therapy     Priority: High  . Pacemaker     Priority: High  . Cough     Priority: High  . Fatigue 12/26/2011  . Brady-tachy syndrome   . Hypothyroidism   . Atrial fibrillation   . ESSENTIAL HYPERTENSION, BENIGN 07/20/2010    ROS   Patient denies fever, chills, headache, sweats, rash, change in vision, change in hearing, chest pain, cough, nausea or vomiting, urinary symptoms. All other systems are reviewed and are negative.  PHYSICAL EXAM  Patient is oriented to person time and place. Affect is normal. She is overweight. Sclera and conjunctiva are normal. Head is atraumatic. There is no jugulovenous distention. Lungs are clear. Respiratory effort is nonlabored. Cardiac exam reveals S1 and S2. Abdomen is soft. There is no peripheral edema.  Filed Vitals:   10/27/13 1012  BP: 130/70  Pulse: 64  Height: 5\' 3"  (1.6 m)  Weight: 205 lb (92.987 kg)    ASSESSMENT & PLAN

## 2013-10-27 NOTE — Assessment & Plan Note (Signed)
She's not having any significant symptoms at this time. Plan to continue the same medications.

## 2013-10-27 NOTE — Assessment & Plan Note (Signed)
Recent TSH was normal 

## 2013-10-27 NOTE — Assessment & Plan Note (Signed)
Blood pressure is controlled. No change in therapy. 

## 2013-10-27 NOTE — Assessment & Plan Note (Signed)
Her rhythm is stable. No change in therapy. She is anticoagulated.

## 2013-10-27 NOTE — Assessment & Plan Note (Signed)
We will continue Pradaxa and flecainide. She is stable with these.

## 2013-10-27 NOTE — Patient Instructions (Signed)
Your physician recommends that you continue on your current medications as directed. Please refer to the Current Medication list given to you today.  Your physician wants you to follow-up in: 6 months. You will receive a reminder letter in the mail two months in advance. If you don't receive a letter, please call our office to schedule the follow-up appointment.  

## 2013-10-30 ENCOUNTER — Telehealth: Payer: Self-pay | Admitting: Cardiology

## 2013-10-30 NOTE — Telephone Encounter (Signed)
Per Dr Ron Parker the pt is advised that her lab work was normal, she verbalized understanding.

## 2013-10-30 NOTE — Telephone Encounter (Signed)
New message ° ° ° ° ° °Returning a nurses call °

## 2013-11-10 ENCOUNTER — Other Ambulatory Visit: Payer: Self-pay

## 2013-11-10 MED ORDER — LEVOTHYROXINE SODIUM 125 MCG PO TABS: 125.0000 ug | ORAL_TABLET | Freq: Every day | ORAL | Status: DC

## 2013-12-04 DIAGNOSIS — M6530 Trigger finger, unspecified finger: Secondary | ICD-10-CM | POA: Diagnosis not present

## 2014-03-01 ENCOUNTER — Encounter: Payer: Medicare Other | Admitting: Internal Medicine

## 2014-03-03 ENCOUNTER — Encounter: Payer: Self-pay | Admitting: Internal Medicine

## 2014-03-03 ENCOUNTER — Ambulatory Visit (INDEPENDENT_AMBULATORY_CARE_PROVIDER_SITE_OTHER): Payer: Medicare Other | Admitting: Internal Medicine

## 2014-03-03 VITALS — BP 114/78 | HR 83 | Ht 63.5 in | Wt 206.8 lb

## 2014-03-03 DIAGNOSIS — Z950 Presence of cardiac pacemaker: Secondary | ICD-10-CM

## 2014-03-03 DIAGNOSIS — I4891 Unspecified atrial fibrillation: Secondary | ICD-10-CM

## 2014-03-03 DIAGNOSIS — I495 Sick sinus syndrome: Secondary | ICD-10-CM

## 2014-03-03 DIAGNOSIS — I480 Paroxysmal atrial fibrillation: Secondary | ICD-10-CM

## 2014-03-03 LAB — MDC_IDC_ENUM_SESS_TYPE_INCLINIC
Battery Impedance: 2900 Ohm
Battery Voltage: 2.7 V
Brady Statistic RA Percent Paced: 95 %
Implantable Pulse Generator Serial Number: 76104415
Lead Channel Impedance Value: 536 Ohm
Lead Channel Pacing Threshold Amplitude: 0.9 V
Lead Channel Pacing Threshold Amplitude: 0.9 V
Lead Channel Pacing Threshold Pulse Width: 0.4 ms
Lead Channel Pacing Threshold Pulse Width: 0.4 ms
Lead Channel Setting Pacing Amplitude: 2 V
Lead Channel Setting Pacing Amplitude: 2.4 V
Lead Channel Setting Pacing Pulse Width: 0.4 ms
Lead Channel Setting Sensing Sensitivity: 2.5 mV
MDC IDC MSMT LEADCHNL RA SENSING INTR AMPL: 1.4 mV
MDC IDC MSMT LEADCHNL RV IMPEDANCE VALUE: 1310 Ohm
MDC IDC MSMT LEADCHNL RV SENSING INTR AMPL: 24.1 mV
MDC IDC SESS DTM: 20150923134933
MDC IDC STAT BRADY RV PERCENT PACED: 92 %

## 2014-03-03 NOTE — Progress Notes (Signed)
Primary Cardiologist:  Dr Ron Parker PCP: Robyn Haber, MD  The patient presents today for routine electrophysiology followup.  Since last being seen in our clinic, the patient reports doing well.  Her afib has been controlled with flecainide.   Today, she denies symptoms of palpitations, chest pain, shortness of breath, orthopnea, PND, lower extremity edema, dizziness, presyncope, syncope, or neurologic sequela.  The patient feels that she is tolerating medications without difficulties and is otherwise without complaint today.   Past Medical History  Diagnosis Date  . Brady-tachy syndrome     Pacemaker   Dr.Wilford Merryfield  . Hypothyroidism   . Atrial fibrillation   . History of hysterectomy   . Drug therapy     Rythmol, atrial fibrillation  . Drug therapy     Pradaxa  (Switch from Coumadin-patient request-March, 2011)  . Pacemaker     Brady tachycardia syndrome  . Cough     Chronic--an ACE inhibitor component  . Hair loss     Patient questioned Coumadin, changed toPradaxa  . Atrial fibrillation     EF 70%, echo, October, 2009, vigorous LV function  /  nuclear November, 2009, no ischemia, shifting breast attenuation  . Cough     March, 2012  . Nausea     Nausea that occurs 2 or 3 hours after eating that is improved with eating a small snack  . Ejection fraction     EF 70%, echo, October, 2009  . Normal nuclear stress test     Nuclear, November, 2009, breast attenuation  . Preop cardiovascular exam     Clearance for her right knee surgery October, 2012  . Back pain   . Heart murmur   . Hypertension    Past Surgical History  Procedure Laterality Date  . Pacemaker placement  2009    PPM-Biotronik  . Abdominal hysterectomy    . Joint replacement      Current Outpatient Prescriptions  Medication Sig Dispense Refill  . Calcium Carbonate-Vit D-Min (CALCIUM 600+D PLUS MINERALS) 600-400 MG-UNIT TABS Take 1 tablet by mouth daily.        . Cholecalciferol (VITAMIN D) 400 UNITS capsule Take  400 Units by mouth daily.        . dabigatran (PRADAXA) 150 MG CAPS capsule Take 1 capsule (150 mg total) by mouth every 12 (twelve) hours.  180 capsule  3  . diclofenac sodium (VOLTAREN) 1 % GEL Apply 1 application topically as needed (pain).       Marland Kitchen diltiazem (CARDIZEM CD) 240 MG 24 hr capsule Take 1 capsule (240 mg total) by mouth daily.  90 capsule  3  . flecainide (TAMBOCOR) 100 MG tablet Take 1 tablet (100 mg total) by mouth 2 (two) times daily.  180 tablet  3  . fluticasone (FLONASE) 50 MCG/ACT nasal spray Place 2 sprays into both nostrils as needed (cough). Takes about 3 days a week      . hydrochlorothiazide (HYDRODIURIL) 25 MG tablet Take 1 tablet (25 mg total) by mouth daily.  90 tablet  3  . levothyroxine (SYNTHROID, LEVOTHROID) 125 MCG tablet Take 1 tablet (125 mcg total) by mouth daily.  90 tablet  1  . metoprolol succinate (TOPROL-XL) 25 MG 24 hr tablet Take 1 tablet (25 mg total) by mouth daily.  90 tablet  3  . Multiple Vitamins-Minerals (OCUVITE PO) Take 1 tablet by mouth daily.       . Omega-3 Fatty Acids (FISH OIL EXTRA STRENGTH PO) Take 1 tablet by mouth daily.       Marland Kitchen  pravastatin (PRAVACHOL) 20 MG tablet Take 1 tablet (20 mg total) by mouth daily.  90 tablet  3   No current facility-administered medications for this visit.    Allergies  Allergen Reactions  . Morphine And Related Other (See Comments)    Patient states that she went "crazy" with this  . Penicillins Swelling and Rash  . Clindamycin/Lincomycin Other (See Comments)    Any meds with mycin in the name  . Erythromycin Other (See Comments)    Any meds with mycin in the name  . Lisinopril Other (See Comments)    REACTION: Cough  . Penicillins Cross Reactors Other (See Comments)    Any meds with mycin in the name   ROS- all systems are reviewed and negative except as per HPI above  History   Social History  . Marital Status: Widowed    Spouse Name: N/A    Number of Children: N/A  . Years of Education:  N/A   Occupational History  . Not on file.   Social History Main Topics  . Smoking status: Former Smoker    Quit date: 09/06/1990  . Smokeless tobacco: Not on file  . Alcohol Use: No  . Drug Use: No  . Sexual Activity: Not on file   Other Topics Concern  . Not on file   Social History Narrative  . No narrative on file    Family History  Problem Relation Age of Onset  . Coronary artery disease    . Heart disease Father   . Heart disease Son    Physical Exam: Filed Vitals:   03/03/14 1131  BP: 114/78  Pulse: 83  Height: 5' 3.5" (1.613 m)  Weight: 206 lb 12.8 oz (93.804 kg)    GEN- The patient is overweight appearing, alert and oriented x 3 today.   Head- normocephalic, atraumatic Eyes-  Sclera clear, conjunctiva pink Ears- hearing intact Oropharynx- clear Neck- supple  Lungs- Clear to ausculation bilaterally, normal work of breathing Chest- pacemaker pocket is well healed Heart- Regular rate and rhythm, no murmurs, rubs or gallops, PMI not laterally displaced GI- soft, NT, ND, + BS Extremities- no clubbing, cyanosis, or edema Neuro- strength and sensation are intact  Pacemaker interrogation- reviewed in detail today,  See PACEART report ekg today reveals sinus with V pacing  Assessment and Plan:  1. Tachy/bradycardia syndrome Normal pacemaker function See Pace Art report No changes today  2. Afib Well controlled  Return to the device clinic in 6 months I will see in a year

## 2014-03-03 NOTE — Patient Instructions (Signed)
Your physician wants you to follow-up in: 6 months in the device clinic and 12 months with Dr Rayann Heman Dennis Bast will receive a reminder letter in the mail two months in advance. If you don't receive a letter, please call our office to schedule the follow-up appointment.  Thank you for choosing [facility]!!     Janan Halter, RN [phone]

## 2014-03-16 DIAGNOSIS — Z23 Encounter for immunization: Secondary | ICD-10-CM | POA: Diagnosis not present

## 2014-04-28 ENCOUNTER — Encounter: Payer: Self-pay | Admitting: Cardiology

## 2014-04-28 ENCOUNTER — Other Ambulatory Visit: Payer: Self-pay

## 2014-04-28 ENCOUNTER — Ambulatory Visit (INDEPENDENT_AMBULATORY_CARE_PROVIDER_SITE_OTHER): Payer: Medicare Other | Admitting: Cardiology

## 2014-04-28 VITALS — BP 130/62 | HR 69 | Ht 63.5 in | Wt 210.2 lb

## 2014-04-28 DIAGNOSIS — I480 Paroxysmal atrial fibrillation: Secondary | ICD-10-CM | POA: Diagnosis not present

## 2014-04-28 DIAGNOSIS — I10 Essential (primary) hypertension: Secondary | ICD-10-CM | POA: Diagnosis not present

## 2014-04-28 DIAGNOSIS — R609 Edema, unspecified: Secondary | ICD-10-CM

## 2014-04-28 DIAGNOSIS — Z950 Presence of cardiac pacemaker: Secondary | ICD-10-CM | POA: Diagnosis not present

## 2014-04-28 DIAGNOSIS — Z79899 Other long term (current) drug therapy: Secondary | ICD-10-CM

## 2014-04-28 DIAGNOSIS — K219 Gastro-esophageal reflux disease without esophagitis: Secondary | ICD-10-CM

## 2014-04-28 DIAGNOSIS — R600 Localized edema: Secondary | ICD-10-CM | POA: Diagnosis not present

## 2014-04-28 HISTORY — DX: Gastro-esophageal reflux disease without esophagitis: K21.9

## 2014-04-28 LAB — BASIC METABOLIC PANEL
BUN: 15 mg/dL (ref 6–23)
CO2: 29 mEq/L (ref 19–32)
Calcium: 9.5 mg/dL (ref 8.4–10.5)
Chloride: 101 mEq/L (ref 96–112)
Creatinine, Ser: 0.9 mg/dL (ref 0.4–1.2)
GFR: 67.61 mL/min (ref >60.00)
Glucose, Bld: 83 mg/dL (ref 70–99)
POTASSIUM: 3.6 meq/L (ref 3.5–5.1)
SODIUM: 139 meq/L (ref 135–145)

## 2014-04-28 LAB — CBC WITH DIFFERENTIAL/PLATELET
BASOS ABS: 0 10*3/uL (ref 0.0–0.1)
BASOS PCT: 0.6 % (ref 0.0–3.0)
EOS PCT: 7.2 % — AB (ref 0.0–5.0)
Eosinophils Absolute: 0.5 10*3/uL (ref 0.0–0.7)
HCT: 43.6 % (ref 36.0–46.0)
HEMOGLOBIN: 14.4 g/dL (ref 12.0–15.0)
LYMPHS PCT: 28.1 % (ref 12.0–46.0)
Lymphs Abs: 2 10*3/uL (ref 0.7–4.0)
MCHC: 33.1 g/dL (ref 30.0–36.0)
MCV: 97.3 fl (ref 78.0–100.0)
MONOS PCT: 13.2 % — AB (ref 3.0–12.0)
Monocytes Absolute: 0.9 10*3/uL (ref 0.1–1.0)
NEUTROS ABS: 3.6 10*3/uL (ref 1.4–7.7)
Neutrophils Relative %: 50.9 % (ref 43.0–77.0)
Platelets: 186 10*3/uL (ref 150.0–400.0)
RBC: 4.49 Mil/uL (ref 3.87–5.11)
RDW: 12.6 % (ref 11.5–15.5)
WBC: 7.1 10*3/uL (ref 4.0–10.5)

## 2014-04-28 MED ORDER — DABIGATRAN ETEXILATE MESYLATE 150 MG PO CAPS: 150.0000 mg | ORAL_CAPSULE | Freq: Two times a day (BID) | ORAL | Status: DC

## 2014-04-28 MED ORDER — HYDROCHLOROTHIAZIDE 25 MG PO TABS
25.0000 mg | ORAL_TABLET | Freq: Every day | ORAL | Status: DC
Start: 2014-04-28 — End: 2015-03-09

## 2014-04-28 MED ORDER — FLECAINIDE ACETATE 100 MG PO TABS: 100.0000 mg | ORAL_TABLET | Freq: Two times a day (BID) | ORAL | Status: DC

## 2014-04-28 MED ORDER — DILTIAZEM HCL ER COATED BEADS 240 MG PO CP24: 240.0000 mg | ORAL_CAPSULE | Freq: Every day | ORAL | Status: DC

## 2014-04-28 MED ORDER — PRAVASTATIN SODIUM 20 MG PO TABS: 20.0000 mg | ORAL_TABLET | Freq: Every day | ORAL | Status: DC

## 2014-04-28 MED ORDER — METOPROLOL SUCCINATE ER 25 MG PO TB24: 25.0000 mg | ORAL_TABLET | Freq: Every day | ORAL | Status: DC

## 2014-04-28 NOTE — Assessment & Plan Note (Signed)
Patient will continue on flecainide.

## 2014-04-28 NOTE — Assessment & Plan Note (Signed)
Recently she's had some episodes of true liquid reflux. She may have a stricture. She is looking into whether she can have GI evaluation.

## 2014-04-28 NOTE — Progress Notes (Signed)
Patient ID: [name], female   DOB: 1928/04/21, 80 y.o.   MRN: [id number]    HPI Patient is seen today to follow-up bradycardia tachycardia syndrome. She is also seen for the assessment of some edema in her legs. She is not having chest pain or shortness of breath.  Patient saw Dr. Rayann Heman For pacemaker follow-up September, 2015. Her rhythm was holding sinus on flecainide.. Pacemaker function was good.  The patient does not carefully watch her salt intake. Also she drinks excess water. She has some mild leg edema. Usually this goes away after resting at night time. There is no shortness of breath.  The patient is also had some recent episodes that may represent significant  GERD  Allergies  Allergen Reactions  . Morphine And Related Other (See Comments)    Patient states that she went "crazy" with this  . Penicillins Swelling and Rash  . Clindamycin/Lincomycin Other (See Comments)    Any meds with mycin in the name  . Erythromycin Other (See Comments)    Any meds with mycin in the name  . Lisinopril Other (See Comments)    REACTION: Cough  . Penicillins Cross Reactors Other (See Comments)    Any meds with mycin in the name    Current Outpatient Prescriptions  Medication Sig Dispense Refill  . Calcium Carbonate-Vit D-Min (CALCIUM 600+D PLUS MINERALS) 600-400 MG-UNIT TABS Take 1 tablet by mouth daily.      . Cholecalciferol (VITAMIN D) 400 UNITS capsule Take 400 Units by mouth daily.      . dabigatran (PRADAXA) 150 MG CAPS capsule Take 1 capsule (150 mg total) by mouth every 12 (twelve) hours. 180 capsule 3  . diclofenac sodium (VOLTAREN) 1 % GEL Apply 1 application topically as needed (pain).     Marland Kitchen diltiazem (CARDIZEM CD) 240 MG 24 hr capsule Take 1 capsule (240 mg total) by mouth daily. 90 capsule 3  . flecainide (TAMBOCOR) 100 MG tablet Take 1 tablet (100 mg total) by mouth 2 (two) times daily. 180 tablet 3  . fluticasone (FLONASE) 50 MCG/ACT nasal spray Place 2 sprays into both  nostrils as needed. Takes about 3 days a week    . hydrochlorothiazide (HYDRODIURIL) 25 MG tablet Take 1 tablet (25 mg total) by mouth daily. 90 tablet 3  . levothyroxine (SYNTHROID, LEVOTHROID) 125 MCG tablet Take 1 tablet (125 mcg total) by mouth daily. 90 tablet 1  . metoprolol succinate (TOPROL-XL) 25 MG 24 hr tablet Take 1 tablet (25 mg total) by mouth daily. 90 tablet 3  . Multiple Vitamins-Minerals (OCUVITE PO) Take 1 tablet by mouth daily.     . Omega-3 Fatty Acids (FISH OIL EXTRA STRENGTH PO) Take 1 tablet by mouth daily.     . pravastatin (PRAVACHOL) 20 MG tablet Take 1 tablet (20 mg total) by mouth daily. 90 tablet 3   No current facility-administered medications for this visit.    History   Social History  . Marital Status: Widowed    Spouse Name: N/A    Number of Children: N/A  . Years of Education: N/A   Occupational History  . Not on file.   Social History Main Topics  . Smoking status: Former Smoker    Quit date: 09/06/1990  . Smokeless tobacco: Not on file  . Alcohol Use: No  . Drug Use: No  . Sexual Activity: Not on file   Other Topics Concern  . Not on file   Social History Narrative  Family History  Problem Relation Age of Onset  . Coronary artery disease    . Heart disease Father   . Heart disease Son     Past Medical History  Diagnosis Date  . Brady-tachy syndrome     Pacemaker   Dr.Allred  . Hypothyroidism   . Atrial fibrillation   . History of hysterectomy   . Drug therapy     Rythmol, atrial fibrillation  . Drug therapy     Pradaxa  (Switch from Coumadin-patient request-March, 2011)  . Pacemaker     Brady tachycardia syndrome  . Cough     Chronic--an ACE inhibitor component  . Hair loss     Patient questioned Coumadin, changed toPradaxa  . Atrial fibrillation     EF 70%, echo, October, 2009, vigorous LV function  /  nuclear November, 2009, no ischemia, shifting breast attenuation  . Cough     March, 2012  . Nausea     Nausea  that occurs 2 or 3 hours after eating that is improved with eating a small snack  . Ejection fraction     EF 70%, echo, October, 2009  . Normal nuclear stress test     Nuclear, November, 2009, breast attenuation  . Preop cardiovascular exam     Clearance for her right knee surgery October, 2012  . Back pain   . Heart murmur   . Hypertension     Past Surgical History  Procedure Laterality Date  . Pacemaker placement  2009    PPM-Biotronik  . Abdominal hysterectomy    . Joint replacement      Patient Active Problem List   Diagnosis Date Noted  . Ejection fraction     Priority: High  . Normal nuclear stress test     Priority: High  . Nausea     Priority: High  . Drug therapy     Priority: High  . Drug therapy     Priority: High  . Pacemaker     Priority: High  . Cough     Priority: High  . Edema leg 04/28/2014  . Fatigue 12/26/2011  . Brady-tachy syndrome   . Hypothyroidism   . Atrial fibrillation   . ESSENTIAL HYPERTENSION, BENIGN 07/20/2010    ROS  Patient denies fever, chills, headache, sweats, rash, change in vision, change in hearing, chest pain, cough, nausea or vomiting, urinary symptoms. All other systems are reviewed and are negative.  PHYSICAL EXAM Patient is overweight. She is stable. She is oriented to person time and place. Affect is normal. Head is atraumatic. Sclerae and conjunctivae are normal. She is overweight. There is no jugular venous distention. Lungs are clear. Respiratory effort is nonlabored. Cardiac exam reveals S1 and S2. Abdomen is soft. She has 1+ bilateral peripheral edema. There are no musculoskeletal deformities. There are no skin rashes.  Filed Vitals:   04/28/14 1043  BP: 130/62  Pulse: 69  Height: 5' 3.5" (1.613 m)  Weight: 210 lb 3.2 oz (95.346 kg)  SpO2: 95%     ASSESSMENT & PLAN

## 2014-04-28 NOTE — Assessment & Plan Note (Signed)
The patient is developed some mild edema. Her weight has gone up since her last visit. She does not add salt. However she does not eat a salt modified diet. Also, she does drink an excessive amount of water. She is on hydrochlorothiazide. I have encouraged her to cut back her total fluid and salt as the first step for treating her edema. Two-dimensional echo be done to be sure that she has not developed significant left ventricular dysfunction. I will then see her for follow-up.

## 2014-04-28 NOTE — Assessment & Plan Note (Signed)
Blood pressure is controlled. No change in therapy. 

## 2014-04-28 NOTE — Assessment & Plan Note (Signed)
Her pacemaker is being followed carefully. No further workup.

## 2014-04-28 NOTE — Assessment & Plan Note (Signed)
Patient will continue on Pradaxa.

## 2014-04-28 NOTE — Assessment & Plan Note (Signed)
The patient responds well to flecainide. She was holding sinus at the time of her last pacemaker check. Flecainide will be continued. She is anticoagulated.

## 2014-04-28 NOTE — Patient Instructions (Addendum)
**Note De-Identified  Obfuscation** Your physician recommends that you continue on your current medications as directed. Please refer to the Current Medication list given to you today.  Your physician has requested that you have an echocardiogram. Echocardiography is a painless test that uses sound waves to create images of your heart. It provides your doctor with information about the size and shape of your heart and how well your heart's chambers and valves are working. This procedure takes approximately one hour. There are no restrictions for this procedure.  Your physician recommends that you schedule a follow-up appointment in: about 1 week after Echo  Dr Ron Parker wants you to decrease your fluid and salt intake.

## 2014-04-30 ENCOUNTER — Ambulatory Visit (HOSPITAL_COMMUNITY): Payer: Medicare Other | Attending: Cardiovascular Disease | Admitting: Cardiology

## 2014-04-30 DIAGNOSIS — I10 Essential (primary) hypertension: Secondary | ICD-10-CM | POA: Diagnosis not present

## 2014-04-30 DIAGNOSIS — R600 Localized edema: Secondary | ICD-10-CM | POA: Insufficient documentation

## 2014-04-30 DIAGNOSIS — I480 Paroxysmal atrial fibrillation: Secondary | ICD-10-CM

## 2014-04-30 DIAGNOSIS — R609 Edema, unspecified: Secondary | ICD-10-CM | POA: Diagnosis not present

## 2014-04-30 NOTE — Progress Notes (Signed)
Echo performed. 

## 2014-05-03 ENCOUNTER — Encounter: Payer: Self-pay | Admitting: Cardiology

## 2014-05-03 DIAGNOSIS — I340 Nonrheumatic mitral (valve) insufficiency: Secondary | ICD-10-CM | POA: Insufficient documentation

## 2014-05-03 HISTORY — DX: Nonrheumatic mitral (valve) insufficiency: I34.0

## 2014-05-07 ENCOUNTER — Ambulatory Visit (INDEPENDENT_AMBULATORY_CARE_PROVIDER_SITE_OTHER): Payer: Medicare Other | Admitting: Cardiology

## 2014-05-07 ENCOUNTER — Telehealth: Payer: Self-pay | Admitting: *Deleted

## 2014-05-07 ENCOUNTER — Encounter: Payer: Self-pay | Admitting: Cardiology

## 2014-05-07 VITALS — BP 102/62 | HR 69 | Ht 63.5 in | Wt 206.8 lb

## 2014-05-07 DIAGNOSIS — I10 Essential (primary) hypertension: Secondary | ICD-10-CM | POA: Diagnosis not present

## 2014-05-07 DIAGNOSIS — E039 Hypothyroidism, unspecified: Secondary | ICD-10-CM

## 2014-05-07 DIAGNOSIS — R600 Localized edema: Secondary | ICD-10-CM

## 2014-05-07 LAB — TSH: TSH: 2.57 u[IU]/mL (ref 0.35–4.50)

## 2014-05-07 NOTE — Assessment & Plan Note (Signed)
Her edema is definitely improved as she is cut back on her salt and fluid intake. No further workup is needed. LV function is normal.

## 2014-05-07 NOTE — Patient Instructions (Signed)
LAB WORK TODAY; TSH  Your physician wants you to follow-up in: [place] DR. KATZ You will receive a reminder letter in the mail two months in advance. If you don't receive a letter, please call our office to schedule the follow-up appointment.

## 2014-05-07 NOTE — Assessment & Plan Note (Signed)
Blood pressure stable. No change in therapy.

## 2014-05-07 NOTE — Progress Notes (Signed)
Patient ID: [name], female   DOB: Apr 29, 1928, 80 y.o.   MRN: [id number]    HPI Patient is seen today to follow-up edema. I saw her on April 28, 2014. We had a careful discussion about her edema. She was not doing a good job overseeing her salt and fluid intake. At that time we discussed at length. She has decreased her salt. She has cut back on extra water she was drinking during the day. She has diuresed 4 pounds without any change in her medications. Her edema is improved and she feels better. We also checked her labs as she is on an anticoagulant. Chemistry and hematology was stable. Two-dimensional echo was done. Her EF is 55-60%. There is septal dyssynergy related to pacing. There was mild mitral regurgitation. This does not need any further workup at this time.  Allergies  Allergen Reactions  . Morphine And Related Other (See Comments)    Patient states that she went "crazy" with this  . Penicillins Swelling and Rash  . Clindamycin/Lincomycin Other (See Comments)    Any meds with mycin in the name  . Erythromycin Other (See Comments)    Any meds with mycin in the name  . Lisinopril Other (See Comments)    REACTION: Cough  . Penicillins Cross Reactors Other (See Comments)    Any meds with mycin in the name    Current Outpatient Prescriptions  Medication Sig Dispense Refill  . Calcium Carbonate-Vit D-Min (CALCIUM 600+D PLUS MINERALS) 600-400 MG-UNIT TABS Take 1 tablet by mouth daily.      . Cholecalciferol (VITAMIN D) 400 UNITS capsule Take 400 Units by mouth daily.      . dabigatran (PRADAXA) 150 MG CAPS capsule Take 1 capsule (150 mg total) by mouth every 12 (twelve) hours. 180 capsule 3  . diclofenac sodium (VOLTAREN) 1 % GEL Apply 1 application topically as needed (pain).     Marland Kitchen diltiazem (CARDIZEM CD) 240 MG 24 hr capsule Take 1 capsule (240 mg total) by mouth daily. 90 capsule 3  . flecainide (TAMBOCOR) 100 MG tablet Take 1 tablet (100 mg total) by mouth 2 (two) times  daily. 180 tablet 3  . fluticasone (FLONASE) 50 MCG/ACT nasal spray Place 2 sprays into both nostrils as needed. Takes about 3 days a week    . hydrochlorothiazide (HYDRODIURIL) 25 MG tablet Take 1 tablet (25 mg total) by mouth daily. 90 tablet 3  . levothyroxine (SYNTHROID, LEVOTHROID) 125 MCG tablet Take 1 tablet (125 mcg total) by mouth daily. 90 tablet 1  . metoprolol succinate (TOPROL-XL) 25 MG 24 hr tablet Take 1 tablet (25 mg total) by mouth daily. 90 tablet 3  . Multiple Vitamins-Minerals (OCUVITE PO) Take 1 tablet by mouth daily.     . Omega-3 Fatty Acids (FISH OIL EXTRA STRENGTH PO) Take 1 tablet by mouth daily.     . pravastatin (PRAVACHOL) 20 MG tablet Take 1 tablet (20 mg total) by mouth daily. 90 tablet 3   No current facility-administered medications for this visit.    History   Social History  . Marital Status: Widowed    Spouse Name: N/A    Number of Children: N/A  . Years of Education: N/A   Occupational History  . Not on file.   Social History Main Topics  . Smoking status: Former Smoker    Quit date: 09/06/1990  . Smokeless tobacco: Not on file  . Alcohol Use: No  . Drug Use: No  . Sexual Activity:  Not on file   Other Topics Concern  . Not on file   Social History Narrative    Family History  Problem Relation Age of Onset  . Coronary artery disease    . Heart disease Father   . Heart disease Son     Past Medical History  Diagnosis Date  . Brady-tachy syndrome     Pacemaker   Dr.Allred  . Hypothyroidism   . Atrial fibrillation   . History of hysterectomy   . Drug therapy     Rythmol, atrial fibrillation  . Drug therapy     Pradaxa  (Switch from Coumadin-patient request-March, 2011)  . Pacemaker     Brady tachycardia syndrome  . Cough     Chronic--an ACE inhibitor component  . Hair loss     Patient questioned Coumadin, changed toPradaxa  . Atrial fibrillation     EF 70%, echo, October, 2009, vigorous LV function  /  nuclear November,  2009, no ischemia, shifting breast attenuation  . Cough     March, 2012  . Nausea     Nausea that occurs 2 or 3 hours after eating that is improved with eating a small snack  . Ejection fraction     EF 70%, echo, October, 2009  . Normal nuclear stress test     Nuclear, November, 2009, breast attenuation  . Preop cardiovascular exam     Clearance for her right knee surgery October, 2012  . Back pain   . Heart murmur   . Hypertension     Past Surgical History  Procedure Laterality Date  . Pacemaker placement  2009    PPM-Biotronik  . Abdominal hysterectomy    . Joint replacement      Patient Active Problem List   Diagnosis Date Noted  . Ejection fraction     Priority: High  . Normal nuclear stress test     Priority: High  . Nausea     Priority: High  . Drug therapy     Priority: High  . Drug therapy     Priority: High  . Pacemaker     Priority: High  . Cough     Priority: High  . Mitral regurgitation 05/03/2014  . Edema leg 04/28/2014  . GERD (gastroesophageal reflux disease) 04/28/2014  . Fatigue 12/26/2011  . Brady-tachy syndrome   . Hypothyroidism   . Paroxysmal atrial fibrillation   . ESSENTIAL HYPERTENSION, BENIGN 07/20/2010    ROS  Patient denies fever, chills, headache, sweats, rash, change in vision, change in hearing, chest pain, cough, nausea or vomiting, urinary symptoms. All other systems are reviewed and are negative.  PHYSICAL EXAM She looks quite good today. She is overweight but stable. Head is atraumatic. Sclera and conjunctiva are normal. There is no jugular venous distention. Lungs are clear. Respiratory effort is nonlabored. Cardiac exam reveals S1 and S2. The abdomen is soft. There is no peripheral edema today. There are no musculoskeletal deformities. There are no skin rashes.  Filed Vitals:   05/07/14 1018  BP: 102/62  Pulse: 69  Height: 5' 3.5" (1.613 m)  Weight: 206 lb 12.8 oz (93.804 kg)  SpO2: 96%     ASSESSMENT & PLAN

## 2014-05-07 NOTE — Telephone Encounter (Signed)
pt notified about normal lab results with verbal understanding 

## 2014-05-11 ENCOUNTER — Other Ambulatory Visit: Payer: Self-pay

## 2014-05-11 MED ORDER — LEVOTHYROXINE SODIUM 125 MCG PO TABS: 125.0000 ug | ORAL_TABLET | Freq: Every day | ORAL | Status: DC

## 2014-05-20 ENCOUNTER — Telehealth: Payer: Self-pay | Admitting: Family Medicine

## 2014-05-20 NOTE — Telephone Encounter (Signed)
Left a message for patient to come in and receive flu shot or call and update the chart with flu shot information.

## 2014-07-20 DIAGNOSIS — H26492 Other secondary cataract, left eye: Secondary | ICD-10-CM | POA: Diagnosis not present

## 2014-07-20 DIAGNOSIS — H31011 Macula scars of posterior pole (postinflammatory) (post-traumatic), right eye: Secondary | ICD-10-CM | POA: Diagnosis not present

## 2014-07-20 DIAGNOSIS — H1851 Endothelial corneal dystrophy: Secondary | ICD-10-CM | POA: Diagnosis not present

## 2014-09-02 ENCOUNTER — Ambulatory Visit (INDEPENDENT_AMBULATORY_CARE_PROVIDER_SITE_OTHER): Payer: Medicare Other | Admitting: *Deleted

## 2014-09-02 DIAGNOSIS — I480 Paroxysmal atrial fibrillation: Secondary | ICD-10-CM | POA: Diagnosis not present

## 2014-09-02 DIAGNOSIS — I495 Sick sinus syndrome: Secondary | ICD-10-CM

## 2014-09-02 LAB — MDC_IDC_ENUM_SESS_TYPE_INCLINIC
Brady Statistic RA Percent Paced: 95 %
Brady Statistic RV Percent Paced: 96 %
Lead Channel Impedance Value: 1310 Ohm
Lead Channel Pacing Threshold Amplitude: 0.9 V
Lead Channel Pacing Threshold Pulse Width: 0.4 ms
Lead Channel Pacing Threshold Pulse Width: 0.4 ms
Lead Channel Setting Pacing Amplitude: 2.4 V
Lead Channel Setting Pacing Pulse Width: 0.4 ms
Lead Channel Setting Sensing Sensitivity: 2.5 mV
MDC IDC MSMT LEADCHNL RA IMPEDANCE VALUE: 510 Ohm
MDC IDC MSMT LEADCHNL RA SENSING INTR AMPL: 1.4 mV
MDC IDC MSMT LEADCHNL RV PACING THRESHOLD AMPLITUDE: 1 V
MDC IDC MSMT LEADCHNL RV SENSING INTR AMPL: 26.8 mV
MDC IDC PG SERIAL: 76104415
MDC IDC SET LEADCHNL RA PACING AMPLITUDE: 2 V

## 2014-09-02 NOTE — Progress Notes (Signed)
Pacemaker check in clinic. Normal device function. Thresholds, sensing, impedances consistent with previous measurements. Device programmed to maximize longevity. No mode switch or high ventricular rates noted. Device programmed at appropriate safety margins. Histogram distribution appropriate for patient activity level. Device programmed to optimize intrinsic conduction. Estimated longevity 1 year 5 months. ROV in September with JA.

## 2014-09-14 ENCOUNTER — Encounter: Payer: Self-pay | Admitting: Internal Medicine

## 2014-09-21 DIAGNOSIS — E785 Hyperlipidemia, unspecified: Secondary | ICD-10-CM | POA: Diagnosis not present

## 2014-09-21 DIAGNOSIS — I480 Paroxysmal atrial fibrillation: Secondary | ICD-10-CM | POA: Diagnosis not present

## 2014-09-21 DIAGNOSIS — I10 Essential (primary) hypertension: Secondary | ICD-10-CM | POA: Diagnosis not present

## 2014-09-21 DIAGNOSIS — E039 Hypothyroidism, unspecified: Secondary | ICD-10-CM | POA: Diagnosis not present

## 2014-09-21 DIAGNOSIS — I495 Sick sinus syndrome: Secondary | ICD-10-CM | POA: Diagnosis not present

## 2014-09-24 DIAGNOSIS — E785 Hyperlipidemia, unspecified: Secondary | ICD-10-CM | POA: Diagnosis not present

## 2014-09-24 DIAGNOSIS — I10 Essential (primary) hypertension: Secondary | ICD-10-CM | POA: Diagnosis not present

## 2014-09-24 DIAGNOSIS — E039 Hypothyroidism, unspecified: Secondary | ICD-10-CM | POA: Diagnosis not present

## 2014-10-29 ENCOUNTER — Telehealth: Payer: Self-pay | Admitting: Cardiology

## 2014-10-29 NOTE — Telephone Encounter (Signed)
**Note De-Identified  Obfuscation** The pt states that when she is walking, at times, she feels a "surge" that goes up her neck into her temples and she then has a headache. She states that when she sits down and relaxes it goes away. She reports that this has happened X 3 since March. She does not know what her BP is at this time but states that it was 139/61 last night. She is unsure if her symptoms are due to her BP or her pacemaker as she states that she was advised that her pacer battery is close to the end of life. She wants both Dr Ron Parker and Dr Rayann Heman to be aware of her s/s. I offered to schedule her an appointment with a NP or PA but she states she is ok right now and has scheduled a f/u with Dr Ron Parker on 12/17/14. She states that if her symptoms worsen she will call back to be seen sooner.   Will send this note to both Dr Ron Parker and Dr Rayann Heman as Juluis Rainier.

## 2014-10-29 NOTE — Telephone Encounter (Signed)
Tried calling patient back. No answer or voicemail picking up.  She is due to see Dr Ron Parker in May per his last note. Hard to know what is going on until I speak with patient

## 2014-10-29 NOTE — Telephone Encounter (Signed)
New Message   Patient states that she has a pacemaker, and she doesn't know if its her BP but when she walks she get like a surges (lightheadness) to her head and it makes her head hurt , she just needs to speak to nurse. She says she feels fine now.

## 2014-11-03 ENCOUNTER — Other Ambulatory Visit: Payer: Self-pay | Admitting: *Deleted

## 2014-11-03 DIAGNOSIS — Z08 Encounter for follow-up examination after completed treatment for malignant neoplasm: Secondary | ICD-10-CM | POA: Diagnosis not present

## 2014-11-03 DIAGNOSIS — Z85828 Personal history of other malignant neoplasm of skin: Secondary | ICD-10-CM | POA: Diagnosis not present

## 2014-11-03 DIAGNOSIS — L570 Actinic keratosis: Secondary | ICD-10-CM | POA: Diagnosis not present

## 2014-11-03 MED ORDER — LEVOTHYROXINE SODIUM 125 MCG PO TABS: 125.0000 ug | ORAL_TABLET | Freq: Every day | ORAL | Status: DC

## 2014-12-06 DIAGNOSIS — Z23 Encounter for immunization: Secondary | ICD-10-CM | POA: Diagnosis not present

## 2014-12-06 DIAGNOSIS — E039 Hypothyroidism, unspecified: Secondary | ICD-10-CM | POA: Diagnosis not present

## 2014-12-17 ENCOUNTER — Ambulatory Visit (INDEPENDENT_AMBULATORY_CARE_PROVIDER_SITE_OTHER): Payer: Medicare Other | Admitting: Cardiology

## 2014-12-17 ENCOUNTER — Encounter: Payer: Self-pay | Admitting: Cardiology

## 2014-12-17 VITALS — BP 138/78 | HR 69 | Ht 63.0 in | Wt 207.0 lb

## 2014-12-17 DIAGNOSIS — Z79899 Other long term (current) drug therapy: Secondary | ICD-10-CM

## 2014-12-17 DIAGNOSIS — I495 Sick sinus syndrome: Secondary | ICD-10-CM

## 2014-12-17 DIAGNOSIS — Z950 Presence of cardiac pacemaker: Secondary | ICD-10-CM

## 2014-12-17 DIAGNOSIS — I480 Paroxysmal atrial fibrillation: Secondary | ICD-10-CM

## 2014-12-17 DIAGNOSIS — Z7901 Long term (current) use of anticoagulants: Secondary | ICD-10-CM

## 2014-12-17 DIAGNOSIS — R600 Localized edema: Secondary | ICD-10-CM

## 2014-12-17 MED ORDER — FLUTICASONE PROPIONATE 50 MCG/ACT NA SUSP: 2.0000 | NASAL | Status: DC | PRN

## 2014-12-17 NOTE — Assessment & Plan Note (Signed)
Patient has chronic mild peripheral edema. I encouraged her again to watch her salt and fluid intake. I've chosen not to change her mild diarrhetic. I believe that some of the changes are related to venous insufficiency.

## 2014-12-17 NOTE — Assessment & Plan Note (Addendum)
Patient has paroxysmal atrial fibrillation with bradycardia tachycardia syndrome historically. She has been nicely controlled in sinus rhythm with flecainide. A level will be checked. She has a pacemaker.[name]

## 2014-12-17 NOTE — Assessment & Plan Note (Signed)
Patient has a pacemaker for bradycardia tachycardia syndrome. She has normal pacing function is followed by our electrophysiology team.

## 2014-12-17 NOTE — Patient Instructions (Addendum)
**Note De-Identified  Obfuscation** Medication Instructions:  Same-no change  Labwork: July 12-Flecainide level and BMET  Testing/Procedures: None  Follow-Up: Your physician wants you to follow-up in: 6 months with Dr Meda Coffee. You will receive a reminder letter in the mail two months in advance. If you don't receive a letter, please call our office to schedule the follow-up appointment.

## 2014-12-17 NOTE — Assessment & Plan Note (Signed)
The patient is been on flecainide since 2013. A level was checked at that time and was in the therapeutic range. We will arrange for a follow-up flecainide level that will be a trough level.

## 2014-12-17 NOTE — Assessment & Plan Note (Addendum)
The patient is on Pradaxa. This will be continued. CBC and chemistry will be checked.Marland Kitchen

## 2014-12-17 NOTE — Progress Notes (Signed)
Cardiology Office Note   Date:  12/17/2014   ID:  [name], DOB Oct 23, 1927, MRN [id number]  PCP:  Robyn Haber, MD  Cardiologist:  Dola Argyle, MD   Chief Complaint  Patient presents with  . Appointment    Follow-up history of edema      History of Present Illness: [name] is a 81 y.o. female who presents today to follow-up atrial fibrillation and mild chronic edema. She is on long-term flecainide. She is held sinus rhythm. Her flecainide level was checked in 2013.  The patient is aware that I will retire at the end of September, 2016. After a discussion today she would like to be followed by Dr. Meda Coffee. We will make arrangements for this.    Past Medical History  Diagnosis Date  . Brady-tachy syndrome     Pacemaker   Dr.Allred  . Hypothyroidism   . Atrial fibrillation   . History of hysterectomy   . Drug therapy     Rythmol, atrial fibrillation  . Drug therapy     Pradaxa  (Switch from Coumadin-patient request-March, 2011)  . Pacemaker     Brady tachycardia syndrome  . Cough     Chronic--an ACE inhibitor component  . Hair loss     Patient questioned Coumadin, changed toPradaxa  . Atrial fibrillation     EF 70%, echo, October, 2009, vigorous LV function  /  nuclear November, 2009, no ischemia, shifting breast attenuation  . Cough     March, 2012  . Nausea     Nausea that occurs 2 or 3 hours after eating that is improved with eating a small snack  . Ejection fraction     EF 70%, echo, October, 2009  . Normal nuclear stress test     Nuclear, November, 2009, breast attenuation  . Preop cardiovascular exam     Clearance for her right knee surgery October, 2012  . Back pain   . Heart murmur   . Hypertension     Past Surgical History  Procedure Laterality Date  . Pacemaker placement  2009    PPM-Biotronik  . Abdominal hysterectomy    . Joint replacement      Patient Active Problem List   Diagnosis Date Noted  . Ejection fraction    Priority: High  . Normal nuclear stress test     Priority: High  . Nausea     Priority: High  . Drug therapy     Priority: High  . Drug therapy     Priority: High  . Pacemaker     Priority: High  . Cough     Priority: High  . Mitral regurgitation 05/03/2014  . Edema leg 04/28/2014  . GERD (gastroesophageal reflux disease) 04/28/2014  . Fatigue 12/26/2011  . Brady-tachy syndrome   . Hypothyroidism   . Paroxysmal atrial fibrillation   . ESSENTIAL HYPERTENSION, BENIGN 07/20/2010      Current Outpatient Prescriptions  Medication Sig Dispense Refill  . Calcium Carbonate-Vit D-Min (CALCIUM 600+D PLUS MINERALS) 600-400 MG-UNIT TABS Take 1 tablet by mouth every other day.     . Cholecalciferol (VITAMIN D) 400 UNITS capsule Take 1,000 Units by mouth daily.     . dabigatran (PRADAXA) 150 MG CAPS capsule Take 1 capsule (150 mg total) by mouth every 12 (twelve) hours. 180 capsule 3  . diclofenac sodium (VOLTAREN) 1 % GEL Apply 1 application topically as needed (pain).     Marland Kitchen diltiazem (CARDIZEM CD) 240 MG 24 hr  capsule Take 1 capsule (240 mg total) by mouth daily. 90 capsule 3  . flecainide (TAMBOCOR) 100 MG tablet Take 1 tablet (100 mg total) by mouth 2 (two) times daily. 180 tablet 3  . fluticasone (FLONASE) 50 MCG/ACT nasal spray Place 2 sprays into both nostrils as needed. Takes about 3 days a week 9.9 g 6  . hydrochlorothiazide (HYDRODIURIL) 25 MG tablet Take 1 tablet (25 mg total) by mouth daily. 90 tablet 3  . levothyroxine (SYNTHROID, LEVOTHROID) 125 MCG tablet Take 1 tablet (125 mcg total) by mouth daily. 90 tablet 0  . metoprolol succinate (TOPROL-XL) 25 MG 24 hr tablet Take 1 tablet (25 mg total) by mouth daily. 90 tablet 3  . Multiple Vitamins-Minerals (OCUVITE PO) Take 1 tablet by mouth daily.      No current facility-administered medications for this visit.    Allergies:   Morphine and related; Penicillins; Clindamycin/lincomycin; Erythromycin; Lisinopril; and Penicillins  cross reactors    Social History:  The patient  reports that she quit smoking about 24 years ago. She does not have any smokeless tobacco history on file. She reports that she does not drink alcohol or use illicit drugs.   Family History:  The patient's family history includes Coronary artery disease in an other family member; Heart disease in her father and son.    ROS:  Please see the history of present illness.     Patient denies fever, chills, headache, sweats, rash, change in vision, change in hearing, chest pain, cough, nausea or vomiting, urinary symptoms. All other systems are reviewed and are negative.   PHYSICAL EXAM: VS:  BP 138/78 mmHg  Pulse 69  Ht 5\' 3"  (1.6 m)  Wt 207 lb (93.895 kg)  BMI 36.68 kg/m2 , Patient is stable. She is oriented to person time and place. Affect is normal. She is overweight. She has mild chronic peripheral edema. Head is atraumatic. Sclera and conjunctiva are normal. There is no jugular venous distention. Lungs are clear. Respiratory effort is not labored. Cardiac exam reveals S1 and S2. Abdomen is soft. She does have mild peripheral edema. There are no musculoskeletal deformities. There are no skin rashes. Neurologic is grossly intact.  EKG:   EKG is done today and reviewed by me. The rhythm is paced.   Recent Labs: 04/28/2014: BUN 15; Creatinine, Ser 0.9; Hemoglobin 14.4; Platelets 186.0; Potassium 3.6; Sodium 139 05/07/2014: TSH 2.57    Lipid Panel    Component Value Date/Time   CHOL 165 11/21/2011 1322   TRIG 127 11/21/2011 1322   HDL 42 11/21/2011 1322   CHOLHDL 3.9 11/21/2011 1322   VLDL 25 11/21/2011 1322   LDLCALC 98 11/21/2011 1322      Wt Readings from Last 3 Encounters:  12/17/14 207 lb (93.895 kg)  05/07/14 206 lb 12.8 oz (93.804 kg)  04/28/14 210 lb 3.2 oz (95.346 kg)      Current medicines are reviewed  The patient understands her medications.     ASSESSMENT AND PLAN:

## 2014-12-21 ENCOUNTER — Telehealth: Payer: Self-pay | Admitting: *Deleted

## 2014-12-21 ENCOUNTER — Other Ambulatory Visit (INDEPENDENT_AMBULATORY_CARE_PROVIDER_SITE_OTHER): Payer: Medicare Other | Admitting: *Deleted

## 2014-12-21 DIAGNOSIS — I495 Sick sinus syndrome: Secondary | ICD-10-CM

## 2014-12-21 DIAGNOSIS — Z7901 Long term (current) use of anticoagulants: Secondary | ICD-10-CM | POA: Diagnosis not present

## 2014-12-21 DIAGNOSIS — Z79899 Other long term (current) drug therapy: Secondary | ICD-10-CM | POA: Diagnosis not present

## 2014-12-21 DIAGNOSIS — E876 Hypokalemia: Secondary | ICD-10-CM

## 2014-12-21 LAB — CBC WITH DIFFERENTIAL/PLATELET
Basophils Absolute: 0 10*3/uL (ref 0.0–0.1)
Basophils Relative: 0.4 % (ref 0.0–3.0)
EOS PCT: 5.4 % — AB (ref 0.0–5.0)
Eosinophils Absolute: 0.3 10*3/uL (ref 0.0–0.7)
HEMATOCRIT: 43.4 % (ref 36.0–46.0)
Hemoglobin: 14.7 g/dL (ref 12.0–15.0)
LYMPHS PCT: 29.4 % (ref 12.0–46.0)
Lymphs Abs: 1.9 10*3/uL (ref 0.7–4.0)
MCHC: 33.8 g/dL (ref 30.0–36.0)
MCV: 95.6 fl (ref 78.0–100.0)
MONO ABS: 0.7 10*3/uL (ref 0.1–1.0)
Monocytes Relative: 10.7 % (ref 3.0–12.0)
NEUTROS PCT: 54.1 % (ref 43.0–77.0)
Neutro Abs: 3.5 10*3/uL (ref 1.4–7.7)
Platelets: 201 10*3/uL (ref 150.0–400.0)
RBC: 4.54 Mil/uL (ref 3.87–5.11)
RDW: 12.7 % (ref 11.5–15.5)
WBC: 6.4 10*3/uL (ref 4.0–10.5)

## 2014-12-21 LAB — BASIC METABOLIC PANEL
BUN: 17 mg/dL (ref 6–23)
CHLORIDE: 101 meq/L (ref 96–112)
CO2: 29 mEq/L (ref 19–32)
Calcium: 9.7 mg/dL (ref 8.4–10.5)
Creatinine, Ser: 0.87 mg/dL (ref 0.40–1.20)
GFR: 65.72 mL/min (ref >60.00)
Glucose, Bld: 103 mg/dL — ABNORMAL HIGH (ref 70–99)
Potassium: 3.2 mEq/L — ABNORMAL LOW (ref 3.5–5.1)
Sodium: 138 mEq/L (ref 135–145)

## 2014-12-21 MED ORDER — POTASSIUM CHLORIDE CRYS ER 20 MEQ PO TBCR: 20.0000 meq | EXTENDED_RELEASE_TABLET | Freq: Every day | ORAL | Status: DC

## 2014-12-21 NOTE — Telephone Encounter (Signed)
Contacted the pt due to her lab results came back and revealed that her potassium level was 3.2. Noted pt is on no potassium supplementation at this time.  Pt is on HCTZ.  Showed the DOD [employer] PA-C, and per [place] the pt should start taking a potassium supplement for hypokalemia.  Informed the pt that per Richardson Dopp PA-C, the pt should take Potassium 20 mEq 2 tabs (total 40 mEq) tonight only, then start taking Potassium 20 mEq (1 tab tomorrow 7/13) po daily thereafter, and come in for a repeat BMET in one week.  Went over again with the pt on the phone that she should take 2 tabs tonight with dinner to total 40 mEq, and then starting tomorrow she will only take 1 tab to total 20 mEq po daily thereafter. Confirmed the pharmacy of choice with the pt.  Scheduled the pt for a repeat BMET for next Tuesday 7/19 at our office to reassess her K level.  Pt verbalized understanding and agree with this plan.  Will forward this note to Dr Ron Parker and covering nurse to make them aware of new med change and repeat BMET for next week.

## 2014-12-27 ENCOUNTER — Telehealth: Payer: Self-pay | Admitting: Cardiology

## 2014-12-27 NOTE — Telephone Encounter (Signed)
Returned pt's call and she said she was calling to see if she is suppose to be seen by a physician tomorrow while she is here. She states she knows she has to come in for lab work because her K+ was low but she wasn't sure about whether or not she was suppose to see a doctor for it. Informed pt that she is just to have blood work drawn so that we can be sure that her K+ came back up to a normal level and that she would not need to see a provider. Pt verbalized understanding and was in agreement with this plan.

## 2014-12-27 NOTE — Telephone Encounter (Signed)
New message    Pt is having some swelling in your legs, ankles and feet.  Test showed potassium was low. Pt has a scratch on leg and it very sore. Pt wants to know if she should be seen by the doctor.    Pt c/o swelling: STAT is pt has developed SOB within 24 hours  1. How long have you been experiencing swelling? Some since beginning of year but has gotten better since visit on July 8th   2. Where is the swelling located? Legs, ankles and feet  3.  Are you currently taking a "fluid pill"? Hydrochlorothiazide  4.  Are you currently SOB? No  5.  Have you traveled recently?No  Please call to discuss

## 2014-12-28 ENCOUNTER — Other Ambulatory Visit (INDEPENDENT_AMBULATORY_CARE_PROVIDER_SITE_OTHER): Payer: Medicare Other

## 2014-12-28 DIAGNOSIS — E876 Hypokalemia: Secondary | ICD-10-CM

## 2014-12-28 LAB — BASIC METABOLIC PANEL
BUN: 18 mg/dL (ref 6–23)
CO2: 30 mEq/L (ref 19–32)
Calcium: 9.7 mg/dL (ref 8.4–10.5)
Chloride: 102 mEq/L (ref 96–112)
Creatinine, Ser: 0.85 mg/dL (ref 0.40–1.20)
GFR: 67.5 mL/min (ref >60.00)
Glucose, Bld: 114 mg/dL — ABNORMAL HIGH (ref 70–99)
Potassium: 3.7 mEq/L (ref 3.5–5.1)
Sodium: 138 mEq/L (ref 135–145)

## 2015-01-28 DIAGNOSIS — R300 Dysuria: Secondary | ICD-10-CM | POA: Diagnosis not present

## 2015-01-28 DIAGNOSIS — N390 Urinary tract infection, site not specified: Secondary | ICD-10-CM | POA: Diagnosis not present

## 2015-01-31 ENCOUNTER — Other Ambulatory Visit: Payer: Self-pay | Admitting: *Deleted

## 2015-01-31 NOTE — Telephone Encounter (Signed)
**Note De-Identified  Obfuscation** Please refer to the pts PCP.

## 2015-01-31 NOTE — Telephone Encounter (Signed)
Ok to refill 

## 2015-02-01 ENCOUNTER — Other Ambulatory Visit: Payer: Self-pay | Admitting: *Deleted

## 2015-02-01 MED ORDER — LEVOTHYROXINE SODIUM 125 MCG PO TABS: 125.0000 ug | ORAL_TABLET | Freq: Every day | ORAL | Status: DC

## 2015-03-08 DIAGNOSIS — R32 Unspecified urinary incontinence: Secondary | ICD-10-CM | POA: Diagnosis not present

## 2015-03-08 DIAGNOSIS — R300 Dysuria: Secondary | ICD-10-CM | POA: Diagnosis not present

## 2015-03-08 DIAGNOSIS — Z23 Encounter for immunization: Secondary | ICD-10-CM | POA: Diagnosis not present

## 2015-03-09 ENCOUNTER — Encounter: Payer: Self-pay | Admitting: Internal Medicine

## 2015-03-09 ENCOUNTER — Ambulatory Visit (INDEPENDENT_AMBULATORY_CARE_PROVIDER_SITE_OTHER): Payer: Medicare Other | Admitting: Internal Medicine

## 2015-03-09 VITALS — BP 122/80 | HR 74 | Ht 63.0 in | Wt 207.4 lb

## 2015-03-09 DIAGNOSIS — I10 Essential (primary) hypertension: Secondary | ICD-10-CM | POA: Diagnosis not present

## 2015-03-09 DIAGNOSIS — Z950 Presence of cardiac pacemaker: Secondary | ICD-10-CM

## 2015-03-09 DIAGNOSIS — I480 Paroxysmal atrial fibrillation: Secondary | ICD-10-CM | POA: Diagnosis not present

## 2015-03-09 DIAGNOSIS — R600 Localized edema: Secondary | ICD-10-CM

## 2015-03-09 DIAGNOSIS — I495 Sick sinus syndrome: Secondary | ICD-10-CM

## 2015-03-09 LAB — CUP PACEART INCLINIC DEVICE CHECK
Date Time Interrogation Session: 20160928141258
Lead Channel Pacing Threshold Amplitude: 0.8 V
Lead Channel Sensing Intrinsic Amplitude: 1.1 mV
Lead Channel Setting Pacing Amplitude: 2 V
Lead Channel Setting Pacing Amplitude: 2.4 V
MDC IDC MSMT BATTERY REMAINING LONGEVITY: 6 mo
MDC IDC MSMT LEADCHNL RA PACING THRESHOLD AMPLITUDE: 1.1 V
MDC IDC MSMT LEADCHNL RA PACING THRESHOLD PULSEWIDTH: 0.4 ms
MDC IDC MSMT LEADCHNL RV PACING THRESHOLD PULSEWIDTH: 0.4 ms
MDC IDC MSMT LEADCHNL RV SENSING INTR AMPL: 26.5 mV
MDC IDC SET LEADCHNL RV PACING PULSEWIDTH: 0.4 ms
MDC IDC SET LEADCHNL RV SENSING SENSITIVITY: 2.5 mV
Pulse Gen Serial Number: 76104415

## 2015-03-09 MED ORDER — DILTIAZEM HCL ER COATED BEADS 120 MG PO CP24: 120.0000 mg | ORAL_CAPSULE | Freq: Every day | ORAL | Status: DC

## 2015-03-09 MED ORDER — HYDROCHLOROTHIAZIDE 25 MG PO TABS: 12.5000 mg | ORAL_TABLET | Freq: Every day | ORAL | Status: DC

## 2015-03-09 MED ORDER — METOPROLOL SUCCINATE ER 25 MG PO TB24: 25.0000 mg | ORAL_TABLET | Freq: Every day | ORAL | Status: DC

## 2015-03-09 NOTE — Patient Instructions (Signed)
Medication Instructions:  Your physician has recommended you make the following change in your medication:  1) Decrease Diltiazem to 120mg  daily 2) Decrease HCTZ to 12.5mg  daily   Labwork: None ordered   Testing/Procedures: None ordered   Follow-Up: Your physician recommends that you schedule a follow-up appointment in: 3 months in the device clinic   Any Other Special Instructions Will Be Listed Below (If Applicable).

## 2015-03-09 NOTE — Progress Notes (Signed)
Primary Cardiologist:  Dr Ron Parker PCP: Milagros Evener, MD  The patient presents today for routine electrophysiology followup.  Since last being seen in our clinic, the patient reports doing well.  Her afib has been controlled with flecainide.  She has occasional postural dizziness. Today, she denies symptoms of palpitations, chest pain, shortness of breath, orthopnea, PND, lower extremity edema, presyncope, syncope, or neurologic sequela.  The patient feels that she is tolerating medications without difficulties and is otherwise without complaint today.   Past Medical History  Diagnosis Date  . Brady-tachy syndrome     Pacemaker   Dr.Allred  . Hypothyroidism   . Atrial fibrillation   . History of hysterectomy   . Drug therapy     Rythmol, atrial fibrillation  . Drug therapy     Pradaxa  (Switch from Coumadin-patient request-March, 2011)  . Pacemaker     Brady tachycardia syndrome  . Cough     Chronic--an ACE inhibitor component  . Hair loss     Patient questioned Coumadin, changed toPradaxa  . Atrial fibrillation     EF 70%, echo, October, 2009, vigorous LV function  /  nuclear November, 2009, no ischemia, shifting breast attenuation  . Cough     March, 2012  . Nausea     Nausea that occurs 2 or 3 hours after eating that is improved with eating a small snack  . Ejection fraction     EF 70%, echo, October, 2009  . Normal nuclear stress test     Nuclear, November, 2009, breast attenuation  . Preop cardiovascular exam     Clearance for her right knee surgery October, 2012  . Back pain   . Heart murmur   . Hypertension    Past Surgical History  Procedure Laterality Date  . Pacemaker placement  2009    PPM-Biotronik  . Abdominal hysterectomy    . Joint replacement      Current Outpatient Prescriptions  Medication Sig Dispense Refill  . Calcium Carbonate-Vit D-Min (CALCIUM 600+D PLUS MINERALS) 600-400 MG-UNIT TABS Take 1 tablet by mouth every other day.     . Cholecalciferol  (VITAMIN D) 400 UNITS capsule Take 400 Units by mouth daily.     . dabigatran (PRADAXA) 150 MG CAPS capsule Take 1 capsule (150 mg total) by mouth every 12 (twelve) hours. 180 capsule 3  . diclofenac sodium (VOLTAREN) 1 % GEL Apply 1 application topically daily as needed (pain).     Marland Kitchen diltiazem (CARDIZEM CD) 120 MG 24 hr capsule Take 1 capsule (120 mg total) by mouth daily. 90 capsule 3  . flecainide (TAMBOCOR) 100 MG tablet Take 1 tablet (100 mg total) by mouth 2 (two) times daily. 180 tablet 3  . fluticasone (FLONASE) 50 MCG/ACT nasal spray Place 2 sprays into both nostrils daily as needed for allergies or rhinitis.    . hydrochlorothiazide (HYDRODIURIL) 25 MG tablet Take 0.5 tablets (12.5 mg total) by mouth daily. 45 tablet 3  . levothyroxine (SYNTHROID, LEVOTHROID) 125 MCG tablet Take 1 tablet (125 mcg total) by mouth daily. 90 tablet 2  . metoprolol succinate (TOPROL-XL) 25 MG 24 hr tablet Take 1 tablet (25 mg total) by mouth daily. 90 tablet 3  . Multiple Vitamins-Minerals (OCUVITE PO) Take 1 tablet by mouth daily.     . potassium chloride SA (K-DUR,KLOR-CON) 20 MEQ tablet Take 1 tablet (20 mEq total) by mouth daily. 60 tablet 2   No current facility-administered medications for this visit.    Allergies  Allergen  Reactions  . Morphine And Related Other (See Comments)    Patient states that she went "crazy" with this  . Penicillins Swelling and Rash  . Clindamycin/Lincomycin Other (See Comments)    Any meds with mycin in the name  . Erythromycin Other (See Comments)    Any meds with mycin in the name  . Lisinopril Other (See Comments)    REACTION: Cough  . Penicillins Cross Reactors Other (See Comments)    Any meds with mycin in the name   ROS- all systems are reviewed and negative except as per HPI above  Social History   Social History  . Marital Status: Widowed    Spouse Name: N/A  . Number of Children: N/A  . Years of Education: N/A   Occupational History  . Not on  file.   Social History Main Topics  . Smoking status: Former Smoker    Quit date: 09/06/1990  . Smokeless tobacco: Not on file  . Alcohol Use: No  . Drug Use: No  . Sexual Activity: Not on file   Other Topics Concern  . Not on file   Social History Narrative    Family History  Problem Relation Age of Onset  . Coronary artery disease    . Heart disease Father   . Heart disease Son    Physical Exam: Filed Vitals:   03/09/15 1148  BP: 122/80  Pulse: 74  Height: 5\' 3"  (1.6 m)  Weight: 207 lb 6.4 oz (94.076 kg)    GEN- The patient is overweight appearing, alert and oriented x 3 today.   Head- normocephalic, atraumatic Eyes-  Sclera clear, conjunctiva pink Ears- hearing intact Oropharynx- clear Neck- supple  Lungs- Clear to ausculation bilaterally, normal work of breathing Chest- pacemaker pocket is well healed Heart- Regular rate and rhythm, no murmurs, rubs or gallops, PMI not laterally displaced GI- soft, NT, ND, + BS Extremities- no clubbing, cyanosis, +2 edema Neuro- strength and sensation are intact  Pacemaker interrogation- reviewed in detail today,  See PACEART report ekg today reveals A V pacing  Assessment and Plan:  1. Tachy/bradycardia syndrome Normal pacemaker function See Pace Art report No changes today 6 months estimated battery longevity remains Would like switch to a SJM or MDT device at time of generator change for better battery longevity Will return in 3 months to the device clinic.  Once she reaches ERI, she can be scheduled for generator change without an additional visit required.  2. Afib Well controlled Reduce diltiazem to 120mg  daily (particularly given edema)  3. HTN I worry that recent postural dizziness is due to dehydration Reduce hctz to 12.5mg  daily  Return to the device clinic in 3 months Thompson Grayer MD, [facility] 03/09/2015 10:08 PM

## 2015-03-10 ENCOUNTER — Telehealth: Payer: Self-pay | Admitting: Internal Medicine

## 2015-03-10 MED ORDER — DILTIAZEM HCL ER COATED BEADS 120 MG PO CP24
120.0000 mg | ORAL_CAPSULE | Freq: Every day | ORAL | Status: DC
Start: 2015-03-10 — End: 2015-05-24

## 2015-03-10 NOTE — Telephone Encounter (Signed)
New message      Pt was seen yesterday.  She said the wrong medication was called in to the pharmacy.  Please call

## 2015-03-10 NOTE — Telephone Encounter (Signed)
Called her Diltiazem 120 mg to her local drug store  She will let me know if there is a problem.  All her other medications she gets from the [place]

## 2015-03-28 ENCOUNTER — Encounter: Payer: Self-pay | Admitting: Internal Medicine

## 2015-04-07 DIAGNOSIS — R300 Dysuria: Secondary | ICD-10-CM | POA: Diagnosis not present

## 2015-04-21 DIAGNOSIS — J029 Acute pharyngitis, unspecified: Secondary | ICD-10-CM | POA: Diagnosis not present

## 2015-04-21 DIAGNOSIS — R05 Cough: Secondary | ICD-10-CM | POA: Diagnosis not present

## 2015-04-21 DIAGNOSIS — J3489 Other specified disorders of nose and nasal sinuses: Secondary | ICD-10-CM | POA: Diagnosis not present

## 2015-04-21 DIAGNOSIS — R0982 Postnasal drip: Secondary | ICD-10-CM | POA: Diagnosis not present

## 2015-05-23 ENCOUNTER — Telehealth: Payer: Self-pay | Admitting: Cardiology

## 2015-05-23 DIAGNOSIS — E876 Hypokalemia: Secondary | ICD-10-CM

## 2015-05-23 NOTE — Telephone Encounter (Signed)
New message   *STAT* If patient is at the pharmacy, call can be transferred to refill team.   1. Which medications need to be refilled? (please list name of each medication and dose if known)  1. dabigatran (PRADAXA) 150 MG CAPS capsule 2. diltiazem (CARDIZEM CD) 120 MG 24 hr capsule 3. flecainide (TAMBOCOR) 100 MG tablet 4. metoprolol succinate (TOPROL-XL) 25 MG 24 hr tablet 5. potassium chloride SA (K-DUR,KLOR-CON) 20 MEQ tablet (NEW) 6. fluticasone (FLONASE) 50 MCG/ACT nasal spray  2. Which pharmacy/location (including street and city if local pharmacy) is medication to be sent to? Jeani Hawking normally typed this out so that she can mail it out to [place]. Please mail the scripts to her.   3. Do they need a 30 day or 90 day supply? 90 day supply of each. 1 script plus 3 refills for each one.

## 2015-05-24 MED ORDER — METOPROLOL SUCCINATE ER 25 MG PO TB24: 25.0000 mg | ORAL_TABLET | Freq: Every day | ORAL | Status: DC

## 2015-05-24 MED ORDER — FLECAINIDE ACETATE 100 MG PO TABS: 100.0000 mg | ORAL_TABLET | Freq: Two times a day (BID) | ORAL | Status: DC

## 2015-05-24 MED ORDER — DILTIAZEM HCL ER COATED BEADS 120 MG PO CP24: 120.0000 mg | ORAL_CAPSULE | Freq: Every day | ORAL | Status: DC

## 2015-05-24 MED ORDER — POTASSIUM CHLORIDE CRYS ER 20 MEQ PO TBCR: 20.0000 meq | EXTENDED_RELEASE_TABLET | Freq: Every day | ORAL | Status: DC

## 2015-05-24 MED ORDER — DABIGATRAN ETEXILATE MESYLATE 150 MG PO CAPS: 150.0000 mg | ORAL_CAPSULE | Freq: Two times a day (BID) | ORAL | Status: DC

## 2015-05-24 NOTE — Telephone Encounter (Signed)
SPOKE TO PT. SHE STATES SHE HAS 4 TO 5 WEEKS LEFT OF MEDS  WILL FORWARD TO [facility] AND PT. WILL FOLLOW UP WITH DR. Meda Coffee

## 2015-05-24 NOTE — Telephone Encounter (Signed)
Will route this refill request to Dr Meda Coffee to approve and have printed off for her to sign, so these medications can be on a hard script for the pt to submit to the [place] to fill.  This is a former Dr Ron Parker pt, and will be due to see Dr Meda Coffee for reestablished 30 min pt slot (former Ron Parker pt) on 07/18/15 at 2pm.  Will go ahead and reorder and print these meds, and have Dr Meda Coffee sign this when she returns to the office this week.  Will contact the pt to inform her when the prescriptions will be available for pick-up.

## 2015-05-24 NOTE — Telephone Encounter (Signed)
Patient of Dr. Hassell Done  but he is not in the office this week, refills sent to DOD Nurse Penni Homans.

## 2015-05-24 NOTE — Telephone Encounter (Signed)
LEFT VM TO CALL THE OFFICE CONCERNING HER REFILL REQUEST

## 2015-05-25 NOTE — Telephone Encounter (Signed)
I will sign tomorrow.

## 2015-05-26 DIAGNOSIS — N390 Urinary tract infection, site not specified: Secondary | ICD-10-CM | POA: Diagnosis not present

## 2015-05-26 DIAGNOSIS — B9620 Unspecified Escherichia coli [E. coli] as the cause of diseases classified elsewhere: Secondary | ICD-10-CM | POA: Diagnosis not present

## 2015-06-07 DIAGNOSIS — I10 Essential (primary) hypertension: Secondary | ICD-10-CM | POA: Diagnosis not present

## 2015-06-07 DIAGNOSIS — E039 Hypothyroidism, unspecified: Secondary | ICD-10-CM | POA: Diagnosis not present

## 2015-06-07 DIAGNOSIS — E785 Hyperlipidemia, unspecified: Secondary | ICD-10-CM | POA: Diagnosis not present

## 2015-06-08 ENCOUNTER — Encounter: Payer: Self-pay | Admitting: Cardiology

## 2015-06-15 ENCOUNTER — Encounter: Payer: Self-pay | Admitting: Internal Medicine

## 2015-06-15 ENCOUNTER — Ambulatory Visit (INDEPENDENT_AMBULATORY_CARE_PROVIDER_SITE_OTHER): Payer: Medicare Other | Admitting: *Deleted

## 2015-06-15 DIAGNOSIS — I495 Sick sinus syndrome: Secondary | ICD-10-CM

## 2015-06-15 LAB — CUP PACEART INCLINIC DEVICE CHECK
Date Time Interrogation Session: 20170104143205
Implantable Lead Implant Date: 20091109
Implantable Lead Location: 753859
Implantable Lead Serial Number: 28411861
Lead Channel Pacing Threshold Amplitude: 0.7 V
Lead Channel Pacing Threshold Pulse Width: 0.4 ms
Lead Channel Setting Pacing Amplitude: 2 V
Lead Channel Setting Pacing Pulse Width: 0.4 ms
Lead Channel Setting Sensing Sensitivity: 2.5 mV
MDC IDC LEAD IMPLANT DT: 20091109
MDC IDC LEAD LOCATION: 753860
MDC IDC LEAD SERIAL: 24891460
MDC IDC MSMT LEADCHNL RA PACING THRESHOLD AMPLITUDE: 0.8 V
MDC IDC MSMT LEADCHNL RA PACING THRESHOLD PULSEWIDTH: 0.4 ms
MDC IDC MSMT LEADCHNL RA SENSING INTR AMPL: 1.5 mV
MDC IDC MSMT LEADCHNL RA SENSING INTR AMPL: 1.7 mV
MDC IDC MSMT LEADCHNL RV SENSING INTR AMPL: 27.5 mV
MDC IDC PG SERIAL: 76104415
MDC IDC SET LEADCHNL RV PACING AMPLITUDE: 2.4 V

## 2015-06-15 NOTE — Progress Notes (Signed)
Pacemaker check in clinic. Normal device function. Thresholds, sensing, impedances consistent with previous measurements. Device programmed to maximize longevity. No mode switch or high ventricular rates noted since last f/u. Device programmed at appropriate safety margins. Histogram distribution appropriate for patient activity level. Device programmed to optimize intrinsic conduction. Estimated longevity 0 months to ERI---battery status is OK. Patient will follow up with the [facility] on 2/6 (batt check only/next billable 09-2015).

## 2015-06-24 ENCOUNTER — Telehealth: Payer: Self-pay | Admitting: Internal Medicine

## 2015-06-24 DIAGNOSIS — I495 Sick sinus syndrome: Secondary | ICD-10-CM

## 2015-06-24 NOTE — Telephone Encounter (Signed)
New problem    Pt stated her heartrate is going slower.. And need to speak to nurse

## 2015-06-24 NOTE — Telephone Encounter (Signed)
Pt has concerns because she was told her pacemaker needs a new battery and she now is noticing her heart rate slowing. Last app 1/4. Currently feeling well but has occasional need to take a deeper breath. No further symptoms. 1/11 pulse was 64, 1/12 p 57 and today p 54. Pt was asked to take pulse 2-3 times a day for further evaluation.  Pt has f/u 07/18/15, told her to call office again with concerns and was told I will forward to Dr Rayann Heman and Claiborne Billings RN to update them. Pt was satisfied with information.

## 2015-06-24 NOTE — Telephone Encounter (Signed)
Please call and reassure patient that her symptoms should not be from pacemaker  OK to go ahead and schedule gen change. Would use [employer] for the gen change.

## 2015-06-27 NOTE — Telephone Encounter (Signed)
06/15/15---Estimated longevity 0 months to ERI---battery status is OK. Patient will follow up with the [facility] on  2/6 (batt check only/next billable 09-2015).Memory Dance  Do I still need to schedule generator change if device not ERI

## 2015-06-28 NOTE — Telephone Encounter (Signed)
I think we go ahead and set her up. Says 0 months to ERI. I am not sure how reliable this biotronik battery ERI algorythm is. IF patient is concerned, might be best to go ahead and change.

## 2015-07-16 NOTE — Telephone Encounter (Signed)
Will have her labs drawn on 07/18/15 while here to see Dr Meda Coffee  I have set her up for PPM generator change out on 08/02/15.   She will need to check in at the [place] [facility] at 8:00am on 08/02/15 Do not eat or drink after midnight and do not take medications the morning of your procedure

## 2015-07-18 ENCOUNTER — Encounter: Payer: Self-pay | Admitting: Cardiology

## 2015-07-18 ENCOUNTER — Other Ambulatory Visit (INDEPENDENT_AMBULATORY_CARE_PROVIDER_SITE_OTHER): Payer: Medicare Other | Admitting: *Deleted

## 2015-07-18 ENCOUNTER — Ambulatory Visit (INDEPENDENT_AMBULATORY_CARE_PROVIDER_SITE_OTHER): Payer: Medicare Other | Admitting: Cardiology

## 2015-07-18 ENCOUNTER — Ambulatory Visit (INDEPENDENT_AMBULATORY_CARE_PROVIDER_SITE_OTHER): Payer: Medicare Other | Admitting: *Deleted

## 2015-07-18 VITALS — BP 130/60 | HR 56 | Ht 63.0 in | Wt 209.8 lb

## 2015-07-18 DIAGNOSIS — I10 Essential (primary) hypertension: Secondary | ICD-10-CM

## 2015-07-18 DIAGNOSIS — Z950 Presence of cardiac pacemaker: Secondary | ICD-10-CM

## 2015-07-18 DIAGNOSIS — I495 Sick sinus syndrome: Secondary | ICD-10-CM

## 2015-07-18 DIAGNOSIS — I480 Paroxysmal atrial fibrillation: Secondary | ICD-10-CM | POA: Diagnosis not present

## 2015-07-18 LAB — CBC WITH DIFFERENTIAL/PLATELET
BASOS ABS: 0.1 10*3/uL (ref 0.0–0.1)
BASOS PCT: 1 % (ref 0–1)
EOS ABS: 0.5 10*3/uL (ref 0.0–0.7)
EOS PCT: 6 % — AB (ref 0–5)
HEMATOCRIT: 42.9 % (ref 36.0–46.0)
HEMOGLOBIN: 14.6 g/dL (ref 12.0–15.0)
LYMPHS ABS: 2.4 10*3/uL (ref 0.7–4.0)
Lymphocytes Relative: 31 % (ref 12–46)
MCH: 32.6 pg (ref 26.0–34.0)
MCHC: 34 g/dL (ref 30.0–36.0)
MCV: 95.8 fL (ref 78.0–100.0)
MPV: 9.1 fL (ref 8.6–12.4)
Monocytes Absolute: 1 10*3/uL (ref 0.1–1.0)
Monocytes Relative: 13 % — ABNORMAL HIGH (ref 3–12)
Neutro Abs: 3.9 10*3/uL (ref 1.7–7.7)
Neutrophils Relative %: 49 % (ref 43–77)
Platelets: 195 10*3/uL (ref 150–400)
RBC: 4.48 MIL/uL (ref 3.87–5.11)
RDW: 13.7 % (ref 11.5–15.5)
WBC: 7.9 10*3/uL (ref 4.0–10.5)

## 2015-07-18 LAB — CUP PACEART INCLINIC DEVICE CHECK
Date Time Interrogation Session: 20170206160823
Implantable Lead Implant Date: 20091109
Implantable Lead Location: 753860
Implantable Lead Model: 350
Implantable Lead Serial Number: 24891460
Lead Channel Setting Pacing Amplitude: 2 V
Lead Channel Setting Pacing Amplitude: 2.4 V
Lead Channel Setting Pacing Pulse Width: 0.4 ms
Lead Channel Setting Sensing Sensitivity: 2.5 mV
MDC IDC LEAD IMPLANT DT: 20091109
MDC IDC LEAD LOCATION: 753859
MDC IDC LEAD SERIAL: 28411861
MDC IDC PG SERIAL: 76104415

## 2015-07-18 LAB — BASIC METABOLIC PANEL
BUN: 18 mg/dL (ref 7–25)
CALCIUM: 9.5 mg/dL (ref 8.6–10.4)
CHLORIDE: 101 mmol/L (ref 98–110)
CO2: 27 mmol/L (ref 20–31)
CREATININE: 1.01 mg/dL — AB (ref 0.60–0.88)
Glucose, Bld: 85 mg/dL (ref 65–99)
Potassium: 4.1 mmol/L (ref 3.5–5.3)
Sodium: 140 mmol/L (ref 135–146)

## 2015-07-18 NOTE — Patient Instructions (Signed)
Medication Instructions:   Your physician recommends that you continue on your current medications as directed. Please refer to the Current Medication list given to you today.  ----PER DR ALLRED, YOU SHOULD HOLD YOUR PRADAXA 48 HOURS PRIOR TO YOUR SCHEDULED GEN CHANGE ON 08/02/15---    Labwork:  TODAY-- FOR YOUR PRE-PROCEDURE WORK-UP, FOR YOUR GEN CHANGE SCHEDULED FOR 08/02/15 (CBC W DIFF AND BMET)     Follow-Up:  Your physician wants you to follow-up in: [place] will receive a reminder letter in the mail two months in advance. If you don't receive a letter, please call our office to schedule the follow-up appointment.    ---PER DR ALLRED, YOU SHOULD HOLD YOUR PRADAXA 48 HOURS PRIOR TO YOUR SCHEDULED GEN CHANGE ON 08/02/15---      If you need a refill on your cardiac medications before your next appointment, please call your pharmacy.

## 2015-07-18 NOTE — Progress Notes (Signed)
Patient ID: [name], female   DOB: 1928/05/15, 82 y.o.   MRN: [id number]    Cardiology Office Note   Date:  07/18/2015   ID:  [name], DOB [birth year], MRN [id number]  PCP:  Aretta Nip, MD  Cardiologist:  Dorothy Spark, MD, previously Dr Ron Parker, EP: Dr Rayann Heman  Chief complain: Dizziness, LE edema   History of Present Illness: [name] is a 82 y.o. female who presents today to follow-up atrial fibrillation and mild chronic diastolic CHF. She has known tachy-brady syndrome, s/p PM placement and exchanged planned for 08/02/2015 by Dr Rayann Heman. On long term flecainide. She has been started on Diltiazem in the past that caused her significant LE edema and skin discoloration and rash, it has subsided with decreased dose. She sometimes feels dizzy, but denies syncope. She has been feeling progressively more tired in the last 2 tears, slowly quitting volunteering activities.  Denies chest pain.   Past Medical History  Diagnosis Date  . Brady-tachy syndrome ([place])     Pacemaker   Dr.Allred  . Hypothyroidism   . Atrial fibrillation ([place])   . History of hysterectomy   . Drug therapy     Rythmol, atrial fibrillation  . Drug therapy     Pradaxa  (Switch from Coumadin-patient request-March, 2011)  . Pacemaker     Brady tachycardia syndrome  . Cough     Chronic--an ACE inhibitor component  . Hair loss     Patient questioned Coumadin, changed toPradaxa  . Atrial fibrillation ([place])     EF 70%, echo, October, 2009, vigorous LV function  /  nuclear November, 2009, no ischemia, shifting breast attenuation  . Cough     March, 2012  . Nausea     Nausea that occurs 2 or 3 hours after eating that is improved with eating a small snack  . Ejection fraction     EF 70%, echo, October, 2009  . Normal nuclear stress test     Nuclear, November, 2009, breast attenuation  . Preop cardiovascular exam     Clearance for her right knee surgery October, 2012  . Back pain   . Heart murmur    . Hypertension     Past Surgical History  Procedure Laterality Date  . Pacemaker placement  2009    PPM-Biotronik  . Abdominal hysterectomy    . Joint replacement      Patient Active Problem List   Diagnosis Date Noted  . Chronic anticoagulation 12/17/2014  . Mitral regurgitation 05/03/2014  . Edema leg 04/28/2014  . GERD (gastroesophageal reflux disease) 04/28/2014  . Fatigue 12/26/2011  . Ejection fraction   . Normal nuclear stress test   . Nausea   . Brady-tachy syndrome ([place])   . Hypothyroidism   . Paroxysmal atrial fibrillation ([facility])   . Drug therapy   . Drug therapy   . Pacemaker   . Cough   . ESSENTIAL HYPERTENSION, BENIGN 07/20/2010      Current Outpatient Prescriptions  Medication Sig Dispense Refill  . Calcium Carbonate-Vit D-Min (CALCIUM 600+D PLUS MINERALS) 600-400 MG-UNIT TABS Take 1 tablet by mouth every other day.     . Cholecalciferol (VITAMIN D) 400 UNITS capsule Take 400 Units by mouth daily.     . dabigatran (PRADAXA) 150 MG CAPS capsule Take 1 capsule (150 mg total) by mouth every 12 (twelve) hours. 180 capsule 3  . diclofenac sodium (VOLTAREN) 1 % GEL Apply 1 application topically daily as needed (pain).     Marland Kitchen  diltiazem (CARDIZEM CD) 120 MG 24 hr capsule Take 1 capsule (120 mg total) by mouth daily. 90 capsule 3  . flecainide (TAMBOCOR) 100 MG tablet Take 1 tablet (100 mg total) by mouth 2 (two) times daily. 180 tablet 3  . fluticasone (FLONASE) 50 MCG/ACT nasal spray Place 2 sprays into both nostrils daily as needed for allergies or rhinitis.    . hydrochlorothiazide (HYDRODIURIL) 25 MG tablet Take 0.5 tablets (12.5 mg total) by mouth daily. 45 tablet 3  . levothyroxine (SYNTHROID, LEVOTHROID) 137 MCG tablet Take 137 mcg by mouth daily.  0  . metoprolol succinate (TOPROL-XL) 25 MG 24 hr tablet Take 1 tablet (25 mg total) by mouth daily. 90 tablet 3  . Multiple Vitamins-Minerals (OCUVITE PO) Take 1 tablet by mouth daily.     . nitrofurantoin  (MACRODANTIN) 50 MG capsule Take 50 mg by mouth at bedtime.    . potassium chloride SA (K-DUR,KLOR-CON) 20 MEQ tablet Take 1 tablet (20 mEq total) by mouth daily. 90 tablet 3   No current facility-administered medications for this visit.    Allergies:   Morphine and related; Penicillins; Clindamycin/lincomycin; Erythromycin; Lisinopril; and Penicillins cross reactors    Social History:  The patient  reports that she quit smoking about 24 years ago. She does not have any smokeless tobacco history on file. She reports that she does not drink alcohol or use illicit drugs.   Family History:  The patient's family history includes Heart disease in her father and son.    ROS:  Please see the history of present illness.     Patient denies fever, chills, headache, sweats, rash, change in vision, change in hearing, chest pain, cough, nausea or vomiting, urinary symptoms. All other systems are reviewed and are negative.   PHYSICAL EXAM: VS:  BP 130/60 mmHg  Pulse 56  Ht 5\' 3"  (1.6 m)  Wt 209 lb 12.8 oz (95.165 kg)  BMI 37.17 kg/m2 , Patient is stable. She is oriented to person time and place. Affect is normal. She is overweight. She has mild chronic peripheral edema. Head is atraumatic. Sclera and conjunctiva are normal. There is no jugular venous distention. Lungs are clear. Respiratory effort is not labored. Cardiac exam reveals S1 and S2. Abdomen is soft. She does have mild peripheral edema. There are no musculoskeletal deformities. There are no skin rashes. Neurologic is grossly intact.  EKG:   EKG is done today and reviewed by me. The rhythm is paced.   Recent Labs: 12/21/2014: Hemoglobin 14.7; Platelets 201.0 12/28/2014: BUN 18; Creatinine, Ser 0.85; Potassium 3.7; Sodium 138    Lipid Panel    Component Value Date/Time   CHOL 165 11/21/2011 1322   TRIG 127 11/21/2011 1322   HDL 42 11/21/2011 1322   CHOLHDL 3.9 11/21/2011 1322   VLDL 25 11/21/2011 1322   LDLCALC 98 11/21/2011 1322       Wt Readings from Last 3 Encounters:  07/18/15 209 lb 12.8 oz (95.165 kg)  03/09/15 207 lb 6.4 oz (94.076 kg)  12/17/14 207 lb (93.895 kg)      Current medicines are reviewed  The patient understands her medications.  ECG: Ventricular pacing, non-specific ST- T wave abnormalities, previously av sequential pacing   ASSESSMENT AND PLAN:  1. Tachy/bradycardia syndrome PM exchange schedule for 08/02/2015.  2. Afib Well controlled Continue flecainide, cardizem, metoprolol  3. HTN Controlled.  4. Chronic diastolic CHF - if her LE persists (mild today) or her rash worsens, consider d/c cardizem and increasing  metoprolol.  Follow up in 6 months.  Dorothy Spark 07/18/2015

## 2015-07-18 NOTE — Patient Instructions (Signed)
Instructions:   - Check in at the [facility] at 8:00am on 08/02/15. - Do not eat or drink after midnight and do not take medications the morning of your procedure.

## 2015-07-18 NOTE — Progress Notes (Signed)
Battery check in clinic to confirm ERI. ERI as of 06/22/15. Plan for generator change on 08/02/15.

## 2015-08-01 ENCOUNTER — Telehealth: Payer: Self-pay | Admitting: Physician Assistant

## 2015-08-01 NOTE — Telephone Encounter (Signed)
Patient contacted his our cardiology service for advice on tomorrow's pacemaker device change out procedure. Patient's device has reached ERI since January 2017. She is currently pending device change out tomorrow on 08/02/2015. However she started having cold-like symptoms including nasal congestion and voice change. I have discussed with Dr. Rayann Heman, she does not need to come to the hospital at 8 AM tomorrow, we will further reassess, Dr. Rayann Heman will ask his nurse practitioner to contact the patient tomorrow. It is possible we will cancel the procedure if she continued to feel bad. However if decided to proceed with the procedure, the earliest time is probably around 11 AM. I have forwarded this to the patient will display clear understanding and agreeable to the plan.  Hilbert Corrigan PA Pager: [phone]

## 2015-08-02 ENCOUNTER — Telehealth: Payer: Self-pay | Admitting: Nurse Practitioner

## 2015-08-02 NOTE — Telephone Encounter (Signed)
Error

## 2015-08-02 NOTE — Telephone Encounter (Signed)
Spoke with patient. Head cold worse today. Will reschedule pacemaker generator change for Monday 08/08/15 at 11AM. Instructions reviewed with patient. Ok to resume Pradaxa for now.  Hold after Saturday morning prior to procedure.  Chanetta Marshall, NP 08/02/2015 7:10 AM

## 2015-08-04 MED ORDER — HEPARIN SODIUM (PORCINE) 1000 UNIT/ML IJ SOLN
INTRAMUSCULAR | Status: AC
  Filled 2015-08-04: qty 1

## 2015-08-04 MED ORDER — ISOPROTERENOL HCL 0.2 MG/ML IJ SOLN
INTRAMUSCULAR | Status: AC
  Filled 2015-08-04: qty 5

## 2015-08-04 MED ORDER — HEPARIN (PORCINE) IN NACL 2-0.9 UNIT/ML-% IJ SOLN
INTRAMUSCULAR | Status: AC
  Filled 2015-08-04: qty 500

## 2015-08-04 MED ORDER — BUPIVACAINE HCL (PF) 0.25 % IJ SOLN
INTRAMUSCULAR | Status: AC
  Filled 2015-08-04: qty 30

## 2015-08-07 MED ORDER — VANCOMYCIN HCL IN DEXTROSE 1-5 GM/200ML-% IV SOLN
1000.0000 mg | INTRAVENOUS | Status: AC
  Administered 2015-08-08: 1000 mg via INTRAVENOUS
  Filled 2015-08-07 (×3): qty 200

## 2015-08-07 MED ORDER — SODIUM CHLORIDE 0.9 % IR SOLN
80.0000 mg | Status: AC
  Administered 2015-08-08: 80 mg
  Filled 2015-08-07: qty 2

## 2015-08-08 ENCOUNTER — Encounter (HOSPITAL_COMMUNITY): Payer: Self-pay | Admitting: Internal Medicine

## 2015-08-08 ENCOUNTER — Encounter: Payer: Self-pay | Admitting: Internal Medicine

## 2015-08-08 ENCOUNTER — Encounter (HOSPITAL_COMMUNITY)
Admission: RE | Disposition: A | Discharge status: Home / Self Care | Payer: Self-pay | Source: Ambulatory Visit | Attending: Internal Medicine

## 2015-08-08 ENCOUNTER — Ambulatory Visit (HOSPITAL_COMMUNITY)
Admission: RE | Admit: 2015-08-08 | Discharge: 2015-08-08 | Disposition: A | Discharge status: Home / Self Care | Payer: Medicare Other | Source: Ambulatory Visit | Attending: Internal Medicine | Admitting: Internal Medicine

## 2015-08-08 DIAGNOSIS — E039 Hypothyroidism, unspecified: Secondary | ICD-10-CM | POA: Insufficient documentation

## 2015-08-08 DIAGNOSIS — Z8249 Family history of ischemic heart disease and other diseases of the circulatory system: Secondary | ICD-10-CM | POA: Insufficient documentation

## 2015-08-08 DIAGNOSIS — I480 Paroxysmal atrial fibrillation: Secondary | ICD-10-CM | POA: Diagnosis present

## 2015-08-08 DIAGNOSIS — Z7901 Long term (current) use of anticoagulants: Secondary | ICD-10-CM

## 2015-08-08 DIAGNOSIS — Z90710 Acquired absence of both cervix and uterus: Secondary | ICD-10-CM | POA: Insufficient documentation

## 2015-08-08 DIAGNOSIS — I495 Sick sinus syndrome: Secondary | ICD-10-CM | POA: Diagnosis not present

## 2015-08-08 DIAGNOSIS — I10 Essential (primary) hypertension: Secondary | ICD-10-CM | POA: Insufficient documentation

## 2015-08-08 DIAGNOSIS — Z87891 Personal history of nicotine dependence: Secondary | ICD-10-CM | POA: Insufficient documentation

## 2015-08-08 DIAGNOSIS — Z880 Allergy status to penicillin: Secondary | ICD-10-CM | POA: Insufficient documentation

## 2015-08-08 DIAGNOSIS — Z45010 Encounter for checking and testing of cardiac pacemaker pulse generator [battery]: Secondary | ICD-10-CM | POA: Insufficient documentation

## 2015-08-08 HISTORY — PX: EP IMPLANTABLE DEVICE: SHX172B

## 2015-08-08 LAB — BASIC METABOLIC PANEL
Anion gap: 10 (ref 5–15)
BUN: 11 mg/dL (ref 6–20)
CHLORIDE: 104 mmol/L (ref 101–111)
CO2: 28 mmol/L (ref 22–32)
Calcium: 9.4 mg/dL (ref 8.9–10.3)
Creatinine, Ser: 1.03 mg/dL — ABNORMAL HIGH (ref 0.44–1.00)
GFR calc Af Amer: 56 mL/min — ABNORMAL LOW (ref >60)
GFR calc non Af Amer: 48 mL/min — ABNORMAL LOW (ref >60)
GLUCOSE: 103 mg/dL — AB (ref 65–99)
POTASSIUM: 4.1 mmol/L (ref 3.5–5.1)
Sodium: 142 mmol/L (ref 135–145)

## 2015-08-08 LAB — SURGICAL PCR SCREEN
MRSA, PCR: NEGATIVE
Staphylococcus aureus: NEGATIVE

## 2015-08-08 LAB — CBC
HCT: 40.9 % (ref 36.0–46.0)
Hemoglobin: 13.7 g/dL (ref 12.0–15.0)
MCH: 31.9 pg (ref 26.0–34.0)
MCHC: 33.5 g/dL (ref 30.0–36.0)
MCV: 95.1 fL (ref 78.0–100.0)
Platelets: 175 10*3/uL (ref 150–400)
RBC: 4.3 MIL/uL (ref 3.87–5.11)
RDW: 12.5 % (ref 11.5–15.5)
WBC: 6.3 10*3/uL (ref 4.0–10.5)

## 2015-08-08 SURGERY — PPM/BIV PPM GENERATOR CHANGEOUT
Anesthesia: LOCAL

## 2015-08-08 MED ORDER — SODIUM CHLORIDE 0.9 % IR SOLN
Status: AC
  Filled 2015-08-08: qty 2

## 2015-08-08 MED ORDER — SODIUM CHLORIDE 0.9 % IV SOLN
INTRAVENOUS | Status: DC
  Administered 2015-08-08: 10:00:00 via INTRAVENOUS

## 2015-08-08 MED ORDER — SODIUM CHLORIDE 0.9% FLUSH: 3.0000 mL | INTRAVENOUS | Status: DC | PRN

## 2015-08-08 MED ORDER — DABIGATRAN ETEXILATE MESYLATE 150 MG PO CAPS: 150.0000 mg | ORAL_CAPSULE | Freq: Two times a day (BID) | ORAL | Status: DC

## 2015-08-08 MED ORDER — MUPIROCIN 2 % EX OINT
TOPICAL_OINTMENT | CUTANEOUS | Status: AC
  Administered 2015-08-08: 1 via TOPICAL
  Filled 2015-08-08: qty 22

## 2015-08-08 MED ORDER — LIDOCAINE HCL (PF) 1 % IJ SOLN
INTRAMUSCULAR | Status: AC
  Filled 2015-08-08: qty 60

## 2015-08-08 MED ORDER — FENTANYL CITRATE (PF) 100 MCG/2ML IJ SOLN
INTRAMUSCULAR | Status: DC | PRN
  Administered 2015-08-08 (×2): 12.5 ug via INTRAVENOUS

## 2015-08-08 MED ORDER — MIDAZOLAM HCL 2 MG/2ML IJ SOLN
INTRAMUSCULAR | Status: AC
  Filled 2015-08-08: qty 2

## 2015-08-08 MED ORDER — SODIUM CHLORIDE 0.9 % IV SOLN: 250.0000 mL | INTRAVENOUS | Status: DC | PRN

## 2015-08-08 MED ORDER — CHLORHEXIDINE GLUCONATE 4 % EX LIQD: 60.0000 mL | Freq: Once | CUTANEOUS | Status: DC

## 2015-08-08 MED ORDER — MIDAZOLAM HCL 5 MG/5ML IJ SOLN
INTRAMUSCULAR | Status: DC | PRN
  Administered 2015-08-08 (×2): 1 mg via INTRAVENOUS

## 2015-08-08 MED ORDER — ACETAMINOPHEN 325 MG PO TABS: 325.0000 mg | ORAL_TABLET | ORAL | Status: DC | PRN

## 2015-08-08 MED ORDER — ONDANSETRON HCL 4 MG/2ML IJ SOLN: 4.0000 mg | Freq: Four times a day (QID) | INTRAMUSCULAR | Status: DC | PRN

## 2015-08-08 MED ORDER — LIDOCAINE HCL (PF) 1 % IJ SOLN
INTRAMUSCULAR | Status: DC | PRN
  Administered 2015-08-08: 37 mL via INTRADERMAL

## 2015-08-08 MED ORDER — SODIUM CHLORIDE 0.9% FLUSH: 3.0000 mL | Freq: Two times a day (BID) | INTRAVENOUS | Status: DC

## 2015-08-08 MED ORDER — FENTANYL CITRATE (PF) 100 MCG/2ML IJ SOLN
INTRAMUSCULAR | Status: AC
  Filled 2015-08-08: qty 2

## 2015-08-08 MED ORDER — MUPIROCIN 2 % EX OINT
1.0000 "application " | TOPICAL_OINTMENT | Freq: Once | CUTANEOUS | Status: AC
  Administered 2015-08-08: 1 via TOPICAL

## 2015-08-08 SURGICAL SUPPLY — 4 items
CABLE SURGICAL S-101-97-12 (CABLE) ×2 IMPLANT
ELECT DEFIB PAD ADLT CADENCE (PAD) ×2 IMPLANT
PPM ASSURITY DR PM2240 (Pacemaker) ×2 IMPLANT
TRAY PACEMAKER INSERTION (PACKS) ×2 IMPLANT

## 2015-08-08 NOTE — Discharge Instructions (Signed)
Pacemaker Battery Change °A pacemaker battery usually lasts 4 to 12 years. Once or twice per year, you will be asked to visit your health care provider to have a full evaluation of your pacemaker. When a battery needs to be replaced, the entire pacemaker is replaced so that you can benefit from new circuitry and any new features that have been added to pacemakers. Most often, this procedure is very simple because the leads are already in place.  °There are many things that affect how long a pacemaker battery will last, including:  °· The age of the pacemaker.   °· The number of leads (1, 2, or 3).   °· The pacemaker work load. If the pacemaker is helping the heart more often, the battery will not last as long as it would if the pacemaker did not need to help the heart.   °· Power (voltage) settings.  °LET YOUR HEALTH CARE PROVIDER KNOW ABOUT:  °· Any allergies you have.   °· All medicines you are taking, including vitamins, herbs, eye drops, creams, and over-the-counter medicines.   °· Previous problems you or members of your family have had with the use of anesthetics.   °· Any blood disorders you have.   °· Previous surgeries you have had, especially since your last pacemaker placement.   °· Medical conditions you have.   °· Possibility of pregnancy, if this applies. °· Symptoms of chest pain, trouble breathing, palpitations, light-headedness, or feelings of an abnormal or irregular heartbeat. °RISKS AND COMPLICATIONS  °Generally, this is a safe procedure. However, as with any procedure, problems can occur and include:  °· Bleeding.   °· Bruising of the skin around where the incision was made.   °· Pain at the incision site.   °· Pulling apart of the skin at the incision site.   °· Infection.   °· Allergic reaction to anesthetics or other medicines used during the procedure.   °People with diabetes may have a temporary increase in their blood sugar after any surgical procedure.  °BEFORE THE PROCEDURE  °· Wash all  of the skin around the area of the chest where the pacemaker is located.   °· Ask your health care provider for help with any medicine adjustments before the pacemaker is replaced.   °· Do not eat or drink anything after midnight on the night before the procedure or as directed by your health care provider. °· Ask your health care provider if you can take a sip of water with any approved medicines the morning of the procedure. °PROCEDURE  °· After giving medicine to numb the skin (local anesthetic), your health care provider will make a cut to reopen the pocket holding the pacemaker.   °· The old pacemaker will be disconnected from its leads.   °· The leads will be tested.   °· If needed, the leads will be replaced. If the leads are functioning properly, the new pacemaker may be connected to the existing leads. °· A heart monitor and the pacemaker programmer will be used to make sure that the new pacemaker is working properly. °· The incision site will then be closed. A dressing will be placed over the pacemaker site. The dressing will be removed 24-48 hours afterward. °AFTER THE PROCEDURE  °· You will be taken to a recovery area after the new pacemaker implant is completed. Your vital signs such as blood pressure, heart rate, breathing, and oxygen levels will be monitored. °· Your health care provider will tell you when you will need to next test your pacemaker or when to return to the office for follow-up   for removal of stitches. °  °This information is not intended to replace advice given to you by your health care provider. Make sure you discuss any questions you have with your health care provider. °  °Document Released: 09/05/2006 Document Revised: 06/18/2014 Document Reviewed: 12/10/2012 °Elsevier Interactive Patient Education ©2016 [employer] ° °

## 2015-08-08 NOTE — H&P (Signed)
The patient presents today for pacemaker generator change.  Since last being seen in our clinic, the patient reports doing well. Her afib has been controlled with flecainide. She has occasional postural dizziness. Today, she denies symptoms of palpitations, chest pain, shortness of breath, orthopnea, PND, lower extremity edema, presyncope, syncope, or neurologic sequela. The patient feels that she is tolerating medications without difficulties and is otherwise without complaint today.   Past Medical History  Diagnosis Date  . Brady-tachy syndrome     Pacemaker Dr.Webber Michiels  . Hypothyroidism   . Atrial fibrillation   . History of hysterectomy   . Drug therapy     Rythmol, atrial fibrillation  . Drug therapy     Pradaxa (Switch from Coumadin-patient request-March, 2011)  . Pacemaker     Brady tachycardia syndrome  . Cough     Chronic--an ACE inhibitor component  . Hair loss     Patient questioned Coumadin, changed toPradaxa  . Atrial fibrillation     EF 70%, echo, October, 2009, vigorous LV function / nuclear November, 2009, no ischemia, shifting breast attenuation  . Cough     March, 2012  . Nausea     Nausea that occurs 2 or 3 hours after eating that is improved with eating a small snack  . Ejection fraction     EF 70%, echo, October, 2009  . Normal nuclear stress test     Nuclear, November, 2009, breast attenuation  . Preop cardiovascular exam     Clearance for her right knee surgery October, 2012  . Back pain   . Heart murmur   . Hypertension    Past Surgical History  Procedure Laterality Date  . Pacemaker placement  2009    PPM-Biotronik  . Abdominal hysterectomy    . Joint replacement      Current Outpatient Prescriptions  Medication Sig Dispense Refill  . Calcium Carbonate-Vit D-Min (CALCIUM 600+D PLUS MINERALS) 600-400 MG-UNIT TABS Take  1 tablet by mouth every other day.     . Cholecalciferol (VITAMIN D) 400 UNITS capsule Take 400 Units by mouth daily.     . dabigatran (PRADAXA) 150 MG CAPS capsule Take 1 capsule (150 mg total) by mouth every 12 (twelve) hours. 180 capsule 3  . diclofenac sodium (VOLTAREN) 1 % GEL Apply 1 application topically daily as needed (pain).     Marland Kitchen diltiazem (CARDIZEM CD) 120 MG 24 hr capsule Take 1 capsule (120 mg total) by mouth daily. 90 capsule 3  . flecainide (TAMBOCOR) 100 MG tablet Take 1 tablet (100 mg total) by mouth 2 (two) times daily. 180 tablet 3  . fluticasone (FLONASE) 50 MCG/ACT nasal spray Place 2 sprays into both nostrils daily as needed for allergies or rhinitis.    . hydrochlorothiazide (HYDRODIURIL) 25 MG tablet Take 0.5 tablets (12.5 mg total) by mouth daily. 45 tablet 3  . levothyroxine (SYNTHROID, LEVOTHROID) 125 MCG tablet Take 1 tablet (125 mcg total) by mouth daily. 90 tablet 2  . metoprolol succinate (TOPROL-XL) 25 MG 24 hr tablet Take 1 tablet (25 mg total) by mouth daily. 90 tablet 3  . Multiple Vitamins-Minerals (OCUVITE PO) Take 1 tablet by mouth daily.     . potassium chloride SA (K-DUR,KLOR-CON) 20 MEQ tablet Take 1 tablet (20 mEq total) by mouth daily. 60 tablet 2   No current facility-administered medications for this visit.    Allergies  Allergen Reactions  . Morphine And Related Other (See Comments)    Patient states that she went "  crazy" with this  . Penicillins Swelling and Rash  . Clindamycin/Lincomycin Other (See Comments)    Any meds with mycin in the name  . Erythromycin Other (See Comments)    Any meds with mycin in the name  . Lisinopril Other (See Comments)    REACTION: Cough  . Penicillins Cross Reactors Other (See Comments)    Any meds with mycin in the name   ROS- all systems are reviewed and negative except as per HPI above  Social History    Social History  . Marital Status: Widowed    Spouse Name: N/A  . Number of Children: N/A  . Years of Education: N/A   Occupational History  . Not on file.   Social History Main Topics  . Smoking status: Former Smoker    Quit date: 09/06/1990  . Smokeless tobacco: Not on file  . Alcohol Use: No  . Drug Use: No  . Sexual Activity: Not on file   Other Topics Concern  . Not on file   Social History Narrative    Family History  Problem Relation Age of Onset  . Coronary artery disease    . Heart disease Father   . Heart disease Son    Physical Exam: .                  Filed Vitals:   08/08/15 0900 08/08/15 0931  BP: 140/122 154/134  Pulse: 51 53  Temp: 98.1 F (36.7 C)   Resp: 18   GEN- The patient is overweight appearing, alert and oriented x 3 today.  Head- normocephalic, atraumatic Eyes- Sclera clear, conjunctiva pink Ears- hearing intact Oropharynx- clear Neck- supple  Lungs- Clear to ausculation bilaterally, normal work of breathing Chest- pacemaker pocket is well healed Heart- Regular rate and rhythm, no murmurs, rubs or gallops, PMI not laterally displaced GI- soft, NT, ND, + BS Extremities- no clubbing, cyanosis, +1 edema Neuro- strength and sensation are intact   Assessment and Plan:  1. Tachy/bradycardia syndrome She has reached ERI battery status Risks, benefits, and alternatives to pacemaker pulse generator replacement were discussed in detail today.  The patient understands that risks include but are not limited to bleeding, infection, pneumothorax, perforation, tamponade, vascular damage, renal failure, MI, stroke, death,  damage to his existing leads, and lead dislodgement and wishes to proceed.  She has not taken pradaxa x 48 hours.  Thompson Grayer MD, [facility] 08/08/2015 10:06 AM

## 2015-08-08 NOTE — Progress Notes (Signed)
Notified Aaron Edelman, RN in cath lab  H&P needed and BMP pending.  BP 154/134, HR 53 Dr Allred aware ok to send pt to cath lab

## 2015-08-12 DIAGNOSIS — E039 Hypothyroidism, unspecified: Secondary | ICD-10-CM | POA: Diagnosis not present

## 2015-08-13 DIAGNOSIS — R1110 Vomiting, unspecified: Secondary | ICD-10-CM | POA: Diagnosis not present

## 2015-08-19 ENCOUNTER — Ambulatory Visit (INDEPENDENT_AMBULATORY_CARE_PROVIDER_SITE_OTHER): Payer: Medicare Other | Admitting: *Deleted

## 2015-08-19 DIAGNOSIS — Z950 Presence of cardiac pacemaker: Secondary | ICD-10-CM | POA: Diagnosis not present

## 2015-08-19 DIAGNOSIS — I495 Sick sinus syndrome: Secondary | ICD-10-CM | POA: Diagnosis not present

## 2015-08-19 LAB — CUP PACEART INCLINIC DEVICE CHECK
Battery Remaining Longevity: 130.8
Battery Voltage: 3.17 V
Brady Statistic RA Percent Paced: 80 %
Brady Statistic RV Percent Paced: 8.1 %
Date Time Interrogation Session: 20170310142116
Implantable Lead Location: 753860
Implantable Lead Serial Number: 28411861
Lead Channel Impedance Value: 1425 Ohm
Lead Channel Pacing Threshold Amplitude: 1 V
Lead Channel Pacing Threshold Pulse Width: 0.4 ms
Lead Channel Pacing Threshold Pulse Width: 0.4 ms
Lead Channel Setting Pacing Amplitude: 2 V
Lead Channel Setting Sensing Sensitivity: 2 mV
MDC IDC LEAD IMPLANT DT: 20091109
MDC IDC LEAD IMPLANT DT: 20091109
MDC IDC LEAD LOCATION: 753859
MDC IDC LEAD SERIAL: 24891460
MDC IDC MSMT LEADCHNL RA IMPEDANCE VALUE: 450 Ohm
MDC IDC MSMT LEADCHNL RA PACING THRESHOLD AMPLITUDE: 1 V
MDC IDC MSMT LEADCHNL RA SENSING INTR AMPL: 0.7 mV
MDC IDC MSMT LEADCHNL RV PACING THRESHOLD AMPLITUDE: 1 V
MDC IDC MSMT LEADCHNL RV PACING THRESHOLD AMPLITUDE: 1 V
MDC IDC MSMT LEADCHNL RV PACING THRESHOLD PULSEWIDTH: 0.4 ms
MDC IDC MSMT LEADCHNL RV PACING THRESHOLD PULSEWIDTH: 0.4 ms
MDC IDC MSMT LEADCHNL RV SENSING INTR AMPL: 12 mV
MDC IDC PG SERIAL: 7885781
MDC IDC SET LEADCHNL RV PACING AMPLITUDE: 2.5 V
MDC IDC SET LEADCHNL RV PACING PULSEWIDTH: 0.4 ms

## 2015-08-19 NOTE — Progress Notes (Signed)
Wound check appointment s/p pacemaker generator replacement 08/08/15 by Dr. Rayann Heman. Steri-strips removed. Wound without redness or edema. Incision edges approximated, wound well healed. Normal device function. Thresholds, RV sensing, and impedances consistent with implant measurements. P waves 0.63mV today- autosense turned on. Histogram distribution appropriate for patient and level of activity. 2 mode switches- known AF (longest 1.5 mins)- on diltiazem and pradaxa. No high ventricular rates noted. Patient educated about wound care. ROV with JA 11/09/15 at 1:45pm.

## 2015-09-05 DIAGNOSIS — Z87440 Personal history of urinary (tract) infections: Secondary | ICD-10-CM | POA: Diagnosis not present

## 2015-09-05 DIAGNOSIS — N390 Urinary tract infection, site not specified: Secondary | ICD-10-CM | POA: Diagnosis not present

## 2015-09-05 DIAGNOSIS — Z0000 Encounter for general adult medical examination without abnormal findings: Secondary | ICD-10-CM | POA: Diagnosis not present

## 2015-10-06 ENCOUNTER — Telehealth: Payer: Self-pay | Admitting: *Deleted

## 2015-10-06 NOTE — Telephone Encounter (Signed)
Returned patient's call.  Advised that the monitor is set up correctly as it transmitted to [place] that the device software had been updated.  Patient states she is also concerned about an itchy area on the left lateral side of her incision.  She states a scab developed and she has been putting Neosporin on it.  She denies redness, swelling, drainage, fever, or chills.  Advised patient that we should schedule her for a device clinic appointment.  She is agreeable to appointment tomorrow, 10/07/15 at 1:00pm.  Patient is appreciative of call and denies additional questions at this time.

## 2015-10-06 NOTE — Telephone Encounter (Signed)
Follow up     Calling to talk to [facility]

## 2015-10-06 NOTE — Telephone Encounter (Addendum)
Patient received Merlin transmitter in the mail and called to request assistance with sending a transmission.  Patient states that she will have to find her phone jack and call me back.  Patient found her phone jack.  Plugged in monitor, proceeded to set up transmission.  Unable to stay on phone with patient as Location manager requires phone line.  Advised patient that I will call her back when transmission is received.  She is agreeable to this plan.  Patient's daughter called back to say that monitor still isn't transmitting.  Determined that patient received a cell adapter.  Assisted daughter in connecting adapter.  She states that patient has to go to a meeting this afternoon and that she will set up the monitor after.  Advised that this will be fine.  Patient's daughter is appreciative of assistance.

## 2015-10-07 ENCOUNTER — Ambulatory Visit (INDEPENDENT_AMBULATORY_CARE_PROVIDER_SITE_OTHER): Payer: Medicare Other | Admitting: *Deleted

## 2015-10-07 DIAGNOSIS — Z950 Presence of cardiac pacemaker: Secondary | ICD-10-CM

## 2015-10-07 NOTE — Progress Notes (Signed)
Patient seen in clinic, added-on for "itchy area" to the left of her PPM incision.  Patient denies drainage, swelling, tenderness, fever, or chills.  She states that the pruritic area was initially the size of her fingernail, but is now just a pinhole covered with a scab.  Area immediately surrounding site is slightly erythemic, but patient states this is related to scratching.  Dr. Rayann Heman saw patient, site appears to be unrelated to healed incision and is to the left of the incision by ~1cm.  Dr. Rayann Heman palpated and probed area, no stitch, tunneling, or drainage noted.  Bandaid applied, patient instructed to keep covered for now so that she does not scratch the site.  Patient instructed to call us if she develops any signs/symptoms of infection, or if her symptoms worsen.  She verbalizes understanding of all instructions and denies additional questions at this time.

## 2015-11-02 DIAGNOSIS — L570 Actinic keratosis: Secondary | ICD-10-CM | POA: Diagnosis not present

## 2015-11-02 DIAGNOSIS — Z85828 Personal history of other malignant neoplasm of skin: Secondary | ICD-10-CM | POA: Diagnosis not present

## 2015-11-09 ENCOUNTER — Encounter: Payer: Self-pay | Admitting: Internal Medicine

## 2015-11-09 ENCOUNTER — Ambulatory Visit (INDEPENDENT_AMBULATORY_CARE_PROVIDER_SITE_OTHER): Payer: Medicare Other | Admitting: Internal Medicine

## 2015-11-09 VITALS — BP 132/60 | HR 63 | Ht 63.5 in | Wt 208.0 lb

## 2015-11-09 DIAGNOSIS — I480 Paroxysmal atrial fibrillation: Secondary | ICD-10-CM | POA: Diagnosis not present

## 2015-11-09 DIAGNOSIS — R600 Localized edema: Secondary | ICD-10-CM

## 2015-11-09 DIAGNOSIS — I10 Essential (primary) hypertension: Secondary | ICD-10-CM

## 2015-11-09 DIAGNOSIS — I495 Sick sinus syndrome: Secondary | ICD-10-CM | POA: Diagnosis not present

## 2015-11-09 LAB — CUP PACEART INCLINIC DEVICE CHECK
Battery Voltage: 3.02 V
Implantable Lead Implant Date: 20091109
Implantable Lead Implant Date: 20091109
Implantable Lead Location: 753859
Implantable Lead Serial Number: 24891460
Implantable Lead Serial Number: 28411861
Lead Channel Impedance Value: 462.5 Ohm
Lead Channel Pacing Threshold Amplitude: 1 V
Lead Channel Pacing Threshold Amplitude: 1 V
Lead Channel Pacing Threshold Amplitude: 1 V
Lead Channel Pacing Threshold Pulse Width: 0.4 ms
Lead Channel Pacing Threshold Pulse Width: 0.4 ms
Lead Channel Sensing Intrinsic Amplitude: 1.8 mV
Lead Channel Setting Pacing Amplitude: 2.5 V
Lead Channel Setting Pacing Pulse Width: 0.4 ms
MDC IDC LEAD LOCATION: 753860
MDC IDC MSMT BATTERY REMAINING LONGEVITY: 126
MDC IDC MSMT LEADCHNL RA PACING THRESHOLD AMPLITUDE: 1 V
MDC IDC MSMT LEADCHNL RA PACING THRESHOLD PULSEWIDTH: 0.4 ms
MDC IDC MSMT LEADCHNL RA PACING THRESHOLD PULSEWIDTH: 0.4 ms
MDC IDC MSMT LEADCHNL RV IMPEDANCE VALUE: 1450 Ohm
MDC IDC MSMT LEADCHNL RV SENSING INTR AMPL: 12 mV
MDC IDC SESS DTM: 20170531172012
MDC IDC SET LEADCHNL RA PACING AMPLITUDE: 2 V
MDC IDC SET LEADCHNL RV SENSING SENSITIVITY: 2 mV
MDC IDC STAT BRADY RA PERCENT PACED: 90 %
MDC IDC STAT BRADY RV PERCENT PACED: 14 %
Pulse Gen Serial Number: 7885781

## 2015-11-09 MED ORDER — HYDROCHLOROTHIAZIDE 25 MG PO TABS: 25.0000 mg | ORAL_TABLET | Freq: Every day | ORAL | Status: DC

## 2015-11-09 NOTE — Patient Instructions (Addendum)
Medication Instructions:  Your physician has recommended you make the following change in your medication:  1) INCREASE Hydrochlorothiazide to 25 mg daily  Labwork: None ordered  Testing/Procedures: None ordered  Follow-Up: Remote monitoring is used to monitor your Pacemaker of ICD from home. This monitoring reduces the number of office visits required to check your device to one time per year. It allows [place] to keep an eye on the functioning of your device to ensure it is working properly. You are scheduled for a device check from home on 02/08/2016. You may send your transmission at any time that day. If you have a wireless device, the transmission will be sent automatically. After your physician reviews your transmission, you will receive a postcard with your next transmission date.  Your physician wants you to follow-up in: 12 months with Dr. Chanetta Marshall, NP. You will receive a reminder letter in the mail two months in advance. If you don't receive a letter, please call our office to schedule the follow-up appointment.  If you need a refill on your cardiac medications before your next appointment, please call your pharmacy.  Thank you for choosing [facility]!!     Any Other Special Instructions Will Be Listed Below (If Applicable). Low-Sodium Eating Plan Sodium raises blood pressure and causes water to be held in the body. Getting less sodium from food will help lower your blood pressure, reduce any swelling, and protect your heart, liver, and kidneys. We get sodium by adding salt (sodium chloride) to food. Most of our sodium comes from canned, boxed, and frozen foods. Restaurant foods, fast foods, and pizza are also very high in sodium. Even if you take medicine to lower your blood pressure or to reduce fluid in your body, getting less sodium from your food is important. WHAT IS MY PLAN? Most people should limit their sodium intake to 2,300 mg a day. Your health care provider  recommends that you limit your sodium intake to __2000 mg__ a day.  WHAT DO I NEED TO KNOW ABOUT THIS EATING PLAN? For the low-sodium eating plan, you will follow these general guidelines:  Choose foods with a % Daily Value for sodium of less than 5% (as listed on the food label).   Use salt-free seasonings or herbs instead of table salt or sea salt.   Check with your health care provider or pharmacist before using salt substitutes.   Eat fresh foods.  Eat more vegetables and fruits.  Limit canned vegetables. If you do use them, rinse them well to decrease the sodium.   Limit cheese to 1 oz (28 g) per day.   Eat lower-sodium products, often labeled as "lower sodium" or "no salt added."  Avoid foods that contain monosodium glutamate (MSG). MSG is sometimes added to [place] food and some canned foods.  Check food labels (Nutrition Facts labels) on foods to learn how much sodium is in one serving.  Eat more home-cooked food and less restaurant, buffet, and fast food.  When eating at a restaurant, ask that your food be prepared with less salt, or no salt if possible.  HOW DO I READ FOOD LABELS FOR SODIUM INFORMATION? The Nutrition Facts label lists the amount of sodium in one serving of the food. If you eat more than one serving, you must multiply the listed amount of sodium by the number of servings. Food labels may also identify foods as:  Sodium free--Less than 5 mg in a serving.  Very low sodium--35 mg or less  in a serving.  Low sodium--140 mg or less in a serving.  Light in sodium--50% less sodium in a serving. For example, if a food that usually has 300 mg of sodium is changed to become light in sodium, it will have 150 mg of sodium.  Reduced sodium--25% less sodium in a serving. For example, if a food that usually has 400 mg of sodium is changed to reduced sodium, it will have 300 mg of sodium. WHAT FOODS CAN I EAT? Grains Low-sodium cereals, including oats,  puffed wheat and rice, and shredded wheat cereals. Low-sodium crackers. Unsalted rice and pasta. Lower-sodium bread.  Vegetables Frozen or fresh vegetables. Low-sodium or reduced-sodium canned vegetables. Low-sodium or reduced-sodium tomato sauce and paste. Low-sodium or reduced-sodium tomato and vegetable juices.  Fruits Fresh, frozen, and canned fruit. Fruit juice.  Meat and Other Protein Products Low-sodium canned tuna and salmon. Fresh or frozen meat, poultry, seafood, and fish. Lamb. Unsalted nuts. Dried beans, peas, and lentils without added salt. Unsalted canned beans. Homemade soups without salt. Eggs.  Dairy Milk. Soy milk. Ricotta cheese. Low-sodium or reduced-sodium cheeses. Yogurt.  Condiments Fresh and dried herbs and spices. Salt-free seasonings. Onion and garlic powders. Low-sodium varieties of mustard and ketchup. Fresh or refrigerated horseradish. Lemon juice.  Fats and Oils Reduced-sodium salad dressings. Unsalted butter.  Other Unsalted popcorn and pretzels.  The items listed above may not be a complete list of recommended foods or beverages. Contact your dietitian for more options. WHAT FOODS ARE NOT RECOMMENDED? Grains Instant hot cereals. Bread stuffing, pancake, and biscuit mixes. Croutons. Seasoned rice or pasta mixes. Noodle soup cups. Boxed or frozen macaroni and cheese. Self-rising flour. Regular salted crackers. Vegetables Regular canned vegetables. Regular canned tomato sauce and paste. Regular tomato and vegetable juices. Frozen vegetables in sauces. Salted [place] fries. Olives. Angie Fava. Relishes. Sauerkraut. Salsa. Meat and Other Protein Products Salted, canned, smoked, spiced, or pickled meats, seafood, or fish. Bacon, ham, sausage, hot dogs, corned beef, chipped beef, and packaged luncheon meats. Salt pork. Jerky. Pickled herring. Anchovies, regular canned tuna, and sardines. Salted nuts. Dairy Processed cheese and cheese spreads. Cheese curds.  Blue cheese and cottage cheese. Buttermilk.  Condiments Onion and garlic salt, seasoned salt, table salt, and sea salt. Canned and packaged gravies. Worcestershire sauce. Tartar sauce. Barbecue sauce. Teriyaki sauce. Soy sauce, including reduced sodium. Steak sauce. Fish sauce. Oyster sauce. Cocktail sauce. Horseradish that you find on the shelf. Regular ketchup and mustard. Meat flavorings and tenderizers. Bouillon cubes. Hot sauce. Tabasco sauce. Marinades. Taco seasonings. Relishes. Fats and Oils Regular salad dressings. Salted butter. Margarine. Ghee. Bacon fat.  Other Potato and tortilla chips. Corn chips and puffs. Salted popcorn and pretzels. Canned or dried soups. Pizza. Frozen entrees and pot pies.  The items listed above may not be a complete list of foods and beverages to avoid. Contact your dietitian for more information.   This information is not intended to replace advice given to you by your health care provider. Make sure you discuss any questions you have with your health care provider.   Document Released: 11/17/2001 Document Revised: 06/18/2014 Document Reviewed: 04/01/2013 Elsevier Interactive Patient Education [employer].

## 2015-11-09 NOTE — Progress Notes (Signed)
Primary Cardiologist:  previously Dr Meda Coffee PCP: Aretta Nip, MD  The patient presents today for routine electrophysiology followup.  Since her recent generator change, the patient reports doing well.  Her afib has been controlled with flecainide.  + edema due to venous insufficiency.  Today, she denies symptoms of palpitations, chest pain, shortness of breath, orthopnea, PND,  presyncope, syncope, or neurologic sequela.  The patient feels that she is tolerating medications without difficulties and is otherwise without complaint today.   Past Medical History  Diagnosis Date  . Brady-tachy syndrome ([place])     Pacemaker   Dr.Jiayi Lengacher  . Hypothyroidism   . Atrial fibrillation ([place])   . History of hysterectomy   . Drug therapy     Rythmol, atrial fibrillation  . Drug therapy     Pradaxa  (Switch from Coumadin-patient request-March, 2011)  . Pacemaker     Brady tachycardia syndrome  . Cough     Chronic--an ACE inhibitor component  . Hair loss     Patient questioned Coumadin, changed toPradaxa  . Atrial fibrillation ([place])     EF 70%, echo, October, 2009, vigorous LV function  /  nuclear November, 2009, no ischemia, shifting breast attenuation  . Cough     March, 2012  . Nausea     Nausea that occurs 2 or 3 hours after eating that is improved with eating a small snack  . Ejection fraction     EF 70%, echo, October, 2009  . Normal nuclear stress test     Nuclear, November, 2009, breast attenuation  . Preop cardiovascular exam     Clearance for her right knee surgery October, 2012  . Back pain   . Heart murmur   . Hypertension    Past Surgical History  Procedure Laterality Date  . Pacemaker placement  2009    PPM-Biotronik  . Abdominal hysterectomy    . Joint replacement    . Ep implantable device N/A 08/08/2015    Generator change with SJM Assurity DR PPM by Dr Rayann Heman    Current Outpatient Prescriptions  Medication Sig Dispense Refill  . Calcium Carbonate-Vit D-Min  (CALCIUM 600+D PLUS MINERALS) 600-400 MG-UNIT TABS Take 1 tablet by mouth daily.     . Cholecalciferol (VITAMIN D) 400 UNITS capsule Take 400 Units by mouth daily.     Marland Kitchen CRANBERRY PO Take 1 capsule by mouth daily.    . dabigatran (PRADAXA) 150 MG CAPS capsule Take 1 capsule (150 mg total) by mouth every 12 (twelve) hours. Resume pradaxa 2/29/17 am 180 capsule 3  . diclofenac sodium (VOLTAREN) 1 % GEL Apply 1 application topically daily as needed (pain).     Marland Kitchen diltiazem (CARDIZEM CD) 120 MG 24 hr capsule Take 1 capsule (120 mg total) by mouth daily. 90 capsule 3  . flecainide (TAMBOCOR) 100 MG tablet Take 1 tablet (100 mg total) by mouth 2 (two) times daily. 180 tablet 3  . fluticasone (FLONASE) 50 MCG/ACT nasal spray Place 2 sprays into both nostrils daily as needed for allergies or rhinitis.    . hydrochlorothiazide (HYDRODIURIL) 25 MG tablet Take 0.5 tablets (12.5 mg total) by mouth daily. 45 tablet 3  . levothyroxine (SYNTHROID, LEVOTHROID) 137 MCG tablet Take 137 mcg by mouth daily.  0  . metoprolol succinate (TOPROL-XL) 25 MG 24 hr tablet Take 1 tablet (25 mg total) by mouth daily. 90 tablet 3  . Multiple Vitamins-Minerals (OCUVITE PO) Take 1 tablet by mouth daily.     Marland Kitchen  nitrofurantoin (MACRODANTIN) 50 MG capsule Take 50 mg by mouth at bedtime.  2  . potassium chloride SA (K-DUR,KLOR-CON) 20 MEQ tablet Take 1 tablet (20 mEq total) by mouth daily. 90 tablet 3   No current facility-administered medications for this visit.    Allergies  Allergen Reactions  . Morphine And Related Other (See Comments)    Patient states that she went "crazy" with this  . Penicillins Swelling and Rash    Has patient had a PCN reaction causing immediate rash, facial/tongue/throat swelling, SOB or lightheadedness with hypotension: No Has patient had a PCN reaction causing severe rash involving mucus membranes or skin necrosis: Yes Has patient had a PCN reaction that required hospitalization No Has patient had a  PCN reaction occurring within the last 10 years: No If all of the above answers are "NO", then may proceed with Cephalosporin use.  . Clindamycin/Lincomycin Other (See Comments)    Any meds with mycin in the name Unknown reaction  . Erythromycin Other (See Comments)    Any meds with mycin in the name Unknown reaction  . Lisinopril Other (See Comments)    REACTION: Cough  . Penicillins Cross Reactors Other (See Comments)    Any meds with mycin in the name Unknown reaction   ROS- all systems are reviewed and negative except as per HPI above  Social History   Social History  . Marital Status: Widowed    Spouse Name: N/A  . Number of Children: N/A  . Years of Education: N/A   Occupational History  . Not on file.   Social History Main Topics  . Smoking status: Former Smoker    Quit date: 09/06/1990  . Smokeless tobacco: Not on file  . Alcohol Use: No  . Drug Use: No  . Sexual Activity: Not on file   Other Topics Concern  . Not on file   Social History Narrative    Family History  Problem Relation Age of Onset  . Coronary artery disease    . Heart disease Father   . Heart disease Son    Physical Exam: Filed Vitals:   11/09/15 1347  BP: 132/60  Pulse: 63  Height: 5' 3.5" (1.613 m)  Weight: 208 lb (94.348 kg)    GEN- The patient is overweight appearing, alert and oriented x 3 today.   Head- normocephalic, atraumatic Eyes-  Sclera clear, conjunctiva pink Ears- hearing intact Oropharynx- clear Neck- supple  Lungs- Clear to ausculation bilaterally, normal work of breathing Chest- pacemaker pocket is well healed Heart- Regular rate and rhythm, no murmurs, rubs or gallops, PMI not laterally displaced GI- soft, NT, ND, + BS Extremities- no clubbing, cyanosis, +2 edema Neuro- strength and sensation are intact  Pacemaker interrogation- reviewed in detail today,  See PACEART report  Assessment and Plan:  1. Tachy/bradycardia syndrome Normal pacemaker  function See Pace Art report No changes today  2. Afib Well controlled  3. HTN Controlled  4. Venous insufficiency Previously reduced hctz due to postural dizziness. I have tried to educate the patient regarding venous insufficiency and that diuresis is not anticipated to be an effective treatment. She is very clear that she wishes to increase hctz back to 25mg  daily.  2 gram sodium restriction advised Support hose and referral to vein specialist encouraged but she declines If dizziness returns, would reduce hctz I have also advised stopping diltiazem but she declines  [place] Return to see EP NP in 1 year Follow-up with Dr Meda Coffee as scheduled  Thompson Grayer MD, [facility] 11/09/2015 2:00 PM

## 2015-11-14 DIAGNOSIS — N3281 Overactive bladder: Secondary | ICD-10-CM | POA: Diagnosis not present

## 2015-11-14 DIAGNOSIS — N3000 Acute cystitis without hematuria: Secondary | ICD-10-CM | POA: Diagnosis not present

## 2015-12-15 DIAGNOSIS — H5203 Hypermetropia, bilateral: Secondary | ICD-10-CM | POA: Diagnosis not present

## 2015-12-15 DIAGNOSIS — H524 Presbyopia: Secondary | ICD-10-CM | POA: Diagnosis not present

## 2015-12-15 DIAGNOSIS — I10 Essential (primary) hypertension: Secondary | ICD-10-CM | POA: Diagnosis not present

## 2015-12-15 DIAGNOSIS — H52223 Regular astigmatism, bilateral: Secondary | ICD-10-CM | POA: Diagnosis not present

## 2015-12-30 DIAGNOSIS — E039 Hypothyroidism, unspecified: Secondary | ICD-10-CM | POA: Diagnosis not present

## 2015-12-30 DIAGNOSIS — E781 Pure hyperglyceridemia: Secondary | ICD-10-CM | POA: Diagnosis not present

## 2015-12-30 DIAGNOSIS — I10 Essential (primary) hypertension: Secondary | ICD-10-CM | POA: Diagnosis not present

## 2015-12-30 DIAGNOSIS — N390 Urinary tract infection, site not specified: Secondary | ICD-10-CM | POA: Diagnosis not present

## 2016-01-25 DIAGNOSIS — N3020 Other chronic cystitis without hematuria: Secondary | ICD-10-CM | POA: Diagnosis not present

## 2016-01-25 DIAGNOSIS — I10 Essential (primary) hypertension: Secondary | ICD-10-CM | POA: Diagnosis not present

## 2016-02-06 ENCOUNTER — Telehealth: Payer: Self-pay | Admitting: *Deleted

## 2016-02-06 NOTE — Telephone Encounter (Signed)
Patient called with questions regarding her home monitor.  Benjamine Mola, RN spoke with patient and advised that transmission from home monitor is scheduled for 02/08/16 and that it should come through overnight automatically.  Our office will call if transmission is not successfully received.  Patient denied additional questions or concerns at this time.

## 2016-02-08 ENCOUNTER — Telehealth: Payer: Self-pay | Admitting: Cardiology

## 2016-02-08 ENCOUNTER — Ambulatory Visit (INDEPENDENT_AMBULATORY_CARE_PROVIDER_SITE_OTHER): Payer: Medicare Other | Admitting: *Deleted

## 2016-02-08 DIAGNOSIS — I495 Sick sinus syndrome: Secondary | ICD-10-CM | POA: Diagnosis not present

## 2016-02-08 NOTE — Telephone Encounter (Signed)
New message    1. Has your device fired? yes 2. Is you device beeping? yes  3. Are you experiencing draining or swelling at device site? no  4. Are you calling to see if we received your device transmission?no 5. Have you passed out? no

## 2016-02-08 NOTE — Telephone Encounter (Signed)
Spoke with pt and reminded pt of remote transmission that is due today. Pt verbalized understanding.   

## 2016-02-08 NOTE — Telephone Encounter (Signed)
Spoke w/ pt and informed her that we did receive her remote transmission. Pt verbalized understanding.  

## 2016-02-08 NOTE — Progress Notes (Signed)
Remote pacemaker transmission.   

## 2016-02-10 ENCOUNTER — Encounter: Payer: Self-pay | Admitting: Cardiology

## 2016-02-14 DIAGNOSIS — N3020 Other chronic cystitis without hematuria: Secondary | ICD-10-CM | POA: Diagnosis not present

## 2016-02-16 ENCOUNTER — Other Ambulatory Visit: Payer: Self-pay

## 2016-02-22 ENCOUNTER — Encounter: Payer: Self-pay | Admitting: Cardiology

## 2016-02-24 LAB — CUP PACEART REMOTE DEVICE CHECK
Brady Statistic RV Percent Paced: 16 %
Implantable Lead Implant Date: 20091109
Implantable Lead Location: 753859
Implantable Lead Model: 350
Lead Channel Pacing Threshold Amplitude: 1 V
Lead Channel Pacing Threshold Amplitude: 1 V
Lead Channel Pacing Threshold Pulse Width: 0.4 ms
MDC IDC LEAD IMPLANT DT: 20091109
MDC IDC LEAD LOCATION: 753860
MDC IDC LEAD SERIAL: 24891460
MDC IDC LEAD SERIAL: 28411861
MDC IDC MSMT BATTERY VOLTAGE: 3.01 V
MDC IDC MSMT LEADCHNL RA SENSING INTR AMPL: 1.4 mV
MDC IDC MSMT LEADCHNL RV PACING THRESHOLD PULSEWIDTH: 0.4 ms
MDC IDC MSMT LEADCHNL RV SENSING INTR AMPL: 12 mV
MDC IDC SESS DTM: 20170915104632
MDC IDC STAT BRADY RA PERCENT PACED: 87 %
Pulse Gen Serial Number: 7885781

## 2016-03-05 ENCOUNTER — Ambulatory Visit: Payer: Medicare Other | Admitting: Cardiology

## 2016-03-08 ENCOUNTER — Ambulatory Visit (INDEPENDENT_AMBULATORY_CARE_PROVIDER_SITE_OTHER): Payer: Medicare Other | Admitting: Cardiology

## 2016-03-08 ENCOUNTER — Encounter (INDEPENDENT_AMBULATORY_CARE_PROVIDER_SITE_OTHER): Payer: Self-pay

## 2016-03-08 VITALS — BP 130/74 | HR 60 | Ht 63.5 in | Wt 206.0 lb

## 2016-03-08 DIAGNOSIS — Z23 Encounter for immunization: Secondary | ICD-10-CM

## 2016-03-08 DIAGNOSIS — I480 Paroxysmal atrial fibrillation: Secondary | ICD-10-CM | POA: Diagnosis not present

## 2016-03-08 DIAGNOSIS — I350 Nonrheumatic aortic (valve) stenosis: Secondary | ICD-10-CM | POA: Insufficient documentation

## 2016-03-08 DIAGNOSIS — I10 Essential (primary) hypertension: Secondary | ICD-10-CM | POA: Diagnosis not present

## 2016-03-08 DIAGNOSIS — E876 Hypokalemia: Secondary | ICD-10-CM | POA: Diagnosis not present

## 2016-03-08 DIAGNOSIS — I495 Sick sinus syndrome: Secondary | ICD-10-CM | POA: Diagnosis not present

## 2016-03-08 DIAGNOSIS — Z950 Presence of cardiac pacemaker: Secondary | ICD-10-CM | POA: Diagnosis not present

## 2016-03-08 DIAGNOSIS — R011 Cardiac murmur, unspecified: Secondary | ICD-10-CM

## 2016-03-08 HISTORY — DX: Nonrheumatic aortic (valve) stenosis: I35.0

## 2016-03-08 MED ORDER — FUROSEMIDE 20 MG PO TABS: 20.0000 mg | ORAL_TABLET | Freq: Every day | ORAL | 3 refills | Status: DC

## 2016-03-08 MED ORDER — DABIGATRAN ETEXILATE MESYLATE 150 MG PO CAPS: 150.0000 mg | ORAL_CAPSULE | Freq: Two times a day (BID) | ORAL | 3 refills | Status: DC

## 2016-03-08 MED ORDER — FLECAINIDE ACETATE 100 MG PO TABS: 100.0000 mg | ORAL_TABLET | Freq: Two times a day (BID) | ORAL | 3 refills | Status: DC

## 2016-03-08 MED ORDER — POTASSIUM CHLORIDE CRYS ER 20 MEQ PO TBCR: 20.0000 meq | EXTENDED_RELEASE_TABLET | Freq: Every day | ORAL | 3 refills | Status: DC

## 2016-03-08 MED ORDER — DILTIAZEM HCL ER COATED BEADS 120 MG PO CP24: 120.0000 mg | ORAL_CAPSULE | Freq: Every day | ORAL | 3 refills | Status: DC

## 2016-03-08 NOTE — Patient Instructions (Addendum)
Medication Instructions:   STOP TAKING HYDROCHLOROTHIAZIDE NOW  START TAKING LASIX 20 MG ONCE DAILY    Labwork:  PRIOR TO YOUR 6 MONTH FOLLOW-UP APPOINTMENT WITH DR Meda Coffee TO CHECK---CMET AND LIPIDS---PLEASE COME FASTING TO THIS LAB APPOINTMENT   Your physician has requested that you have an echocardiogram. Echocardiography is a painless test that uses sound waves to create images of your heart. It provides your doctor with information about the size and shape of your heart and how well your heart's chambers and valves are working. This procedure takes approximately one hour. There are no restrictions for this procedure.    Follow-Up:  Your physician wants you to follow-up in: [place] will receive a reminder letter in the mail two months in advance. If you don't receive a letter, please call our office to schedule the follow-up appointment.  PLEASE HAVE YOUR LABS DONE PRIOR TO THIS OFFICE VISIT       If you need a refill on your cardiac medications before your next appointment, please call your pharmacy.

## 2016-03-08 NOTE — Progress Notes (Signed)
Patient ID: [name], female   DOB: 11/03/1927, 82 y.o.   MRN: [id number]    Cardiology Office Note   Date:  03/08/2016   ID:  [name], DOB Apr 23, 1928, MRN [id number]  PCP:  Aretta Nip, MD  Cardiologist:  Ena Dawley, MD, previously Dr Ron Parker, EP: Dr Rayann Heman  Chief complain: Dizziness, LE edema   History of Present Illness: [name] is a 82 y.o. female who presents today to follow-up atrial fibrillation and mild chronic diastolic CHF. She has known tachy-brady syndrome, s/p PM placement and exchanged planned for 08/02/2015 by Dr Rayann Heman. On long term flecainide. She has been started on Diltiazem in the past that caused her significant LE edema and skin discoloration and rash, it has subsided with decreased dose. She sometimes feels dizzy, but denies syncope. She has been feeling progressively more tired in the last 2 tears, slowly quitting volunteering activities.  Denies chest pain.  03/08/16 - 4 months follow up, feels well, has on and off LE edema, L>R, stable DOE, no chest pain, palpitations or syncope. Compliant with her meds. She feels better since receiving the new PM in 07/2015. No fever, well healed incision site, no drainage. No bleeding with Dabigatran.   Past Medical History:  Diagnosis Date  . Atrial fibrillation ([place])   . Atrial fibrillation ([place])    EF 70%, echo, October, 2009, vigorous LV function  /  nuclear November, 2009, no ischemia, shifting breast attenuation  . Back pain   . Brady-tachy syndrome ([place])    Pacemaker   Dr.Allred  . Cough    Chronic--an ACE inhibitor component  . Cough    March, 2012  . Drug therapy    Rythmol, atrial fibrillation  . Drug therapy    Pradaxa  (Switch from Coumadin-patient request-March, 2011)  . Ejection fraction    EF 70%, echo, October, 2009  . Hair loss    Patient questioned Coumadin, changed toPradaxa  . Heart murmur   . History of hysterectomy   . Hypertension   . Hypothyroidism   . Nausea    Nausea that occurs 2 or 3 hours after eating that is improved with eating a small snack  . Normal nuclear stress test    Nuclear, November, 2009, breast attenuation  . Pacemaker    Brady tachycardia syndrome  . Preop cardiovascular exam    Clearance for her right knee surgery October, 2012    Past Surgical History:  Procedure Laterality Date  . ABDOMINAL HYSTERECTOMY    . EP IMPLANTABLE DEVICE N/A 08/08/2015   Generator change with SJM Assurity DR PPM by Dr Rayann Heman  . JOINT REPLACEMENT    . PACEMAKER PLACEMENT  2009   PPM-Biotronik    Patient Active Problem List   Diagnosis Date Noted  . Aortic stenosis 03/08/2016  . Chronic anticoagulation 12/17/2014  . Mitral regurgitation 05/03/2014  . Edema leg 04/28/2014  . GERD (gastroesophageal reflux disease) 04/28/2014  . Fatigue 12/26/2011  . Ejection fraction   . Normal nuclear stress test   . Nausea   . Brady-tachy syndrome ([place])   . Hypothyroidism   . Paroxysmal atrial fibrillation ([facility])   . Drug therapy   . Drug therapy   . Pacemaker   . Cough   . ESSENTIAL HYPERTENSION, BENIGN 07/20/2010      Current Outpatient Prescriptions  Medication Sig Dispense Refill  . Calcium Carbonate-Vit D-Min (CALCIUM 600+D PLUS MINERALS) 600-400 MG-UNIT TABS Take 1 tablet by mouth daily.     Marland Kitchen  Cholecalciferol (VITAMIN D) 400 UNITS capsule Take 400 Units by mouth daily.     Marland Kitchen CRANBERRY PO Take 2 capsules by mouth daily.     . dabigatran (PRADAXA) 150 MG CAPS capsule Take 1 capsule (150 mg total) by mouth every 12 (twelve) hours. Resume pradaxa 2/29/17 am 180 capsule 3  . diclofenac sodium (VOLTAREN) 1 % GEL Apply 1 application topically daily as needed (pain).     Marland Kitchen diltiazem (CARDIZEM CD) 120 MG 24 hr capsule Take 1 capsule (120 mg total) by mouth daily. 90 capsule 3  . flecainide (TAMBOCOR) 100 MG tablet Take 1 tablet (100 mg total) by mouth 2 (two) times daily. 180 tablet 3  . fluticasone (FLONASE) 50 MCG/ACT nasal spray Place 2 sprays into  both nostrils daily as needed for allergies or rhinitis.    Marland Kitchen levothyroxine (SYNTHROID, LEVOTHROID) 137 MCG tablet Take 137 mcg by mouth daily.  0  . metoprolol succinate (TOPROL-XL) 25 MG 24 hr tablet Take 1 tablet (25 mg total) by mouth daily. 90 tablet 3  . Multiple Vitamins-Minerals (OCUVITE PO) Take 1 tablet by mouth daily.     . nitrofurantoin (MACRODANTIN) 50 MG capsule Take 50 mg by mouth at bedtime.  2  . potassium chloride SA (K-DUR,KLOR-CON) 20 MEQ tablet Take 1 tablet (20 mEq total) by mouth daily. 90 tablet 3  . Probiotic Product (PROBIOTIC PO) Take 1 tablet by mouth daily.    . furosemide (LASIX) 20 MG tablet Take 1 tablet (20 mg total) by mouth daily. 90 tablet 3   No current facility-administered medications for this visit.     Allergies:   Morphine and related; Penicillins; Clindamycin/lincomycin; Erythromycin; Lisinopril; and Penicillins cross reactors    Social History:  The patient  reports that she quit smoking about 25 years ago. She has never used smokeless tobacco. She reports that she does not drink alcohol or use drugs.   Family History:  The patient's family history includes Heart disease in her father and son.    ROS:  Please see the history of present illness.     Patient denies fever, chills, headache, sweats, rash, change in vision, change in hearing, chest pain, cough, nausea or vomiting, urinary symptoms. All other systems are reviewed and are negative.   PHYSICAL EXAM: VS:  BP 130/74   Pulse 60   Ht 5' 3.5" (1.613 m)   Wt 206 lb (93.4 kg)   BMI 35.92 kg/m  , Patient is stable. She is oriented to person time and place. Affect is normal. She is overweight. She has mild chronic peripheral edema. Head is atraumatic. Sclera and conjunctiva are normal. There is no jugular venous distention. Lungs are clear. Respiratory effort is not labored. Cardiac exam reveals S1 and S2. 3/6 systolic murmur at the RUSB.  Abdomen is soft. She does have mild peripheral edema.  There are no musculoskeletal deformities. There are no skin rashes. Neurologic is grossly intact.  EKG:   EKG is done today and reviewed by me. The rhythm is paced, intermittent atrial.   Recent Labs: 08/08/2015: BUN 11; Creatinine, Ser 1.03; Hemoglobin 13.7; Platelets 175; Potassium 4.1; Sodium 142    Lipid Panel    Component Value Date/Time   CHOL 165 11/21/2011 1322   TRIG 127 11/21/2011 1322   HDL 42 11/21/2011 1322   CHOLHDL 3.9 11/21/2011 1322   VLDL 25 11/21/2011 1322   LDLCALC 98 11/21/2011 1322      Wt Readings from Last 3 Encounters:  03/08/16  206 lb (93.4 kg)  11/09/15 208 lb (94.3 kg)  08/08/15 209 lb (94.8 kg)    Current medicines are reviewed  The patient understands her medications.  ECG: Ventricular pacing, non-specific ST- T wave abnormalities, previously av sequential pacing   ASSESSMENT AND PLAN:  1. Tachy/bradycardia syndrome PM exchange in 2/17, interrogated by Dr Rayann Heman in May 2017, functioning well.  2. Afib Well controlled Continue flecainide, cardizem, metoprolol  3. HTN Controlled.  4. Acute on Chronic diastolic CHF -with LE edema, d/c hydrochlorothiazide and start lasix 20 mg po daily.  5. Systolic murmur - most probably aortic stenosis, previosuly sclerosis, we will order an echocardiogram.  Follow up in 6 months.  Ena Dawley 03/08/2016

## 2016-03-09 NOTE — Addendum Note (Signed)
Addended by: Marlis Edelson C on: 03/09/2016 01:49 PM   Modules accepted: Orders

## 2016-03-21 ENCOUNTER — Ambulatory Visit (HOSPITAL_COMMUNITY): Payer: Medicare Other | Attending: Cardiovascular Disease

## 2016-03-21 ENCOUNTER — Other Ambulatory Visit: Payer: Self-pay

## 2016-03-21 ENCOUNTER — Telehealth: Payer: Self-pay | Admitting: *Deleted

## 2016-03-21 DIAGNOSIS — Z950 Presence of cardiac pacemaker: Secondary | ICD-10-CM | POA: Diagnosis not present

## 2016-03-21 DIAGNOSIS — I110 Hypertensive heart disease with heart failure: Secondary | ICD-10-CM | POA: Insufficient documentation

## 2016-03-21 DIAGNOSIS — Z87891 Personal history of nicotine dependence: Secondary | ICD-10-CM | POA: Diagnosis not present

## 2016-03-21 DIAGNOSIS — I480 Paroxysmal atrial fibrillation: Secondary | ICD-10-CM

## 2016-03-21 DIAGNOSIS — Z8249 Family history of ischemic heart disease and other diseases of the circulatory system: Secondary | ICD-10-CM | POA: Diagnosis not present

## 2016-03-21 DIAGNOSIS — I509 Heart failure, unspecified: Secondary | ICD-10-CM | POA: Diagnosis not present

## 2016-03-21 DIAGNOSIS — I350 Nonrheumatic aortic (valve) stenosis: Secondary | ICD-10-CM

## 2016-03-21 DIAGNOSIS — I340 Nonrheumatic mitral (valve) insufficiency: Secondary | ICD-10-CM | POA: Diagnosis not present

## 2016-03-21 DIAGNOSIS — E876 Hypokalemia: Secondary | ICD-10-CM

## 2016-03-21 DIAGNOSIS — I10 Essential (primary) hypertension: Secondary | ICD-10-CM | POA: Diagnosis not present

## 2016-03-21 DIAGNOSIS — I4891 Unspecified atrial fibrillation: Secondary | ICD-10-CM | POA: Diagnosis present

## 2016-03-21 MED ORDER — FUROSEMIDE 20 MG PO TABS: 20.0000 mg | ORAL_TABLET | Freq: Every day | ORAL | 0 refills | Status: DC

## 2016-03-21 NOTE — Telephone Encounter (Signed)
Pt walked into the clinic to discuss that she mailed off her information and prescription Dr Meda Coffee printed off and gave her at her last OV on 9/28, for lasix 20 mg po daily.  Pt states that the mail delivery service will not have her med delivered to her for a couple of weeks from now, so she will need a month supply to also be sent to her local pharmacy of choice ([facility]), so she will be able to continue taking this on a daily scheduled basis.  Informed the pt that I will send this in now and make a note to the pharmacy that this is only for a 30 day supply, to hold her over until she gets her mail service meds delivered to her.  Pt verbalized understanding, agrees with this plan, and gracious for all the assistance provided.

## 2016-05-09 ENCOUNTER — Ambulatory Visit (INDEPENDENT_AMBULATORY_CARE_PROVIDER_SITE_OTHER): Payer: Medicare Other | Admitting: *Deleted

## 2016-05-09 DIAGNOSIS — I495 Sick sinus syndrome: Secondary | ICD-10-CM | POA: Diagnosis not present

## 2016-05-10 NOTE — Progress Notes (Signed)
Remote pacemaker transmission.   

## 2016-05-18 ENCOUNTER — Encounter: Payer: Self-pay | Admitting: Cardiology

## 2016-05-22 ENCOUNTER — Encounter: Payer: Self-pay | Admitting: Cardiology

## 2016-06-21 LAB — CUP PACEART REMOTE DEVICE CHECK
Battery Remaining Longevity: 121 mo
Brady Statistic AP VS Percent: 72 %
Brady Statistic AS VP Percent: 1 %
Date Time Interrogation Session: 20171129070015
Implantable Lead Implant Date: 20091109
Implantable Lead Location: 753859
Implantable Lead Model: 350
Implantable Lead Serial Number: 24891460
Lead Channel Impedance Value: 450 Ohm
Lead Channel Pacing Threshold Amplitude: 1 V
Lead Channel Sensing Intrinsic Amplitude: 12 mV
Lead Channel Setting Pacing Amplitude: 2 V
Lead Channel Setting Pacing Amplitude: 2.5 V
Lead Channel Setting Pacing Pulse Width: 0.4 ms
MDC IDC LEAD IMPLANT DT: 20091109
MDC IDC LEAD LOCATION: 753860
MDC IDC LEAD SERIAL: 28411861
MDC IDC MSMT BATTERY REMAINING PERCENTAGE: 95.5 %
MDC IDC MSMT BATTERY VOLTAGE: 3.01 V
MDC IDC MSMT LEADCHNL RA PACING THRESHOLD AMPLITUDE: 1 V
MDC IDC MSMT LEADCHNL RA PACING THRESHOLD PULSEWIDTH: 0.4 ms
MDC IDC MSMT LEADCHNL RA SENSING INTR AMPL: 1.3 mV
MDC IDC MSMT LEADCHNL RV IMPEDANCE VALUE: 1475 Ohm
MDC IDC MSMT LEADCHNL RV PACING THRESHOLD PULSEWIDTH: 0.4 ms
MDC IDC PG IMPLANT DT: 20170227
MDC IDC SET LEADCHNL RV SENSING SENSITIVITY: 2 mV
MDC IDC STAT BRADY AP VP PERCENT: 15 %
MDC IDC STAT BRADY AS VS PERCENT: 13 %
MDC IDC STAT BRADY RA PERCENT PACED: 87 %
MDC IDC STAT BRADY RV PERCENT PACED: 15 %
Pulse Gen Model: 2240
Pulse Gen Serial Number: 7885781

## 2016-07-06 ENCOUNTER — Telehealth: Payer: Self-pay | Admitting: Cardiology

## 2016-07-06 MED ORDER — HYDROCHLOROTHIAZIDE 25 MG PO TABS: 25.0000 mg | ORAL_TABLET | Freq: Every day | ORAL | 3 refills | Status: DC

## 2016-07-06 NOTE — Telephone Encounter (Signed)
Called patient about Dr. Francesca Oman recommendations. Sent HCTZ 25 mg by mouth daily to patient's pharmacy. Added Lasix to patient's allergy list. Took Lasix off patient's list. Patient verbalized understanding.

## 2016-07-06 NOTE — Telephone Encounter (Signed)
Please switch back to HCTZ 25 mg po daily

## 2016-07-06 NOTE — Telephone Encounter (Signed)
Pt taking Lasix, thinks she is having an allergic reaction-itching, dryness, stopped taking it 10 days ago, felt better, started taking again now having symptoms again-pls advise--ringing in her ears all summer  Pt c/o medication issue:  1. Name of Medication: lasix  2. How are you currently taking this medication (dosage and times per day)? 1 a day  3. Are you having a reaction (difficulty breathing--STAT)? no  4. What is your medication issue? Wants to go back on the HZTZ  OK TO LEAVE MESSAGE ON VM

## 2016-07-17 DIAGNOSIS — L298 Other pruritus: Secondary | ICD-10-CM | POA: Diagnosis not present

## 2016-08-01 DIAGNOSIS — R8299 Other abnormal findings in urine: Secondary | ICD-10-CM | POA: Diagnosis not present

## 2016-08-01 DIAGNOSIS — R300 Dysuria: Secondary | ICD-10-CM | POA: Diagnosis not present

## 2016-08-08 ENCOUNTER — Ambulatory Visit (INDEPENDENT_AMBULATORY_CARE_PROVIDER_SITE_OTHER): Payer: Medicare Other | Admitting: *Deleted

## 2016-08-08 DIAGNOSIS — I495 Sick sinus syndrome: Secondary | ICD-10-CM | POA: Diagnosis not present

## 2016-08-08 NOTE — Progress Notes (Signed)
Remote pacemaker transmission.   

## 2016-08-09 ENCOUNTER — Encounter: Payer: Self-pay | Admitting: Cardiology

## 2016-08-14 LAB — CUP PACEART REMOTE DEVICE CHECK
Battery Remaining Longevity: 121 mo
Brady Statistic AP VP Percent: 15 %
Brady Statistic AP VS Percent: 72 %
Brady Statistic AS VP Percent: 1 %
Brady Statistic AS VS Percent: 13 %
Brady Statistic RV Percent Paced: 15 %
Implantable Lead Implant Date: 20091109
Implantable Lead Location: 753859
Implantable Lead Model: 350
Implantable Lead Serial Number: 24891460
Implantable Lead Serial Number: 28411861
Lead Channel Impedance Value: 1500 Ohm
Lead Channel Impedance Value: 490 Ohm
Lead Channel Pacing Threshold Amplitude: 1 V
Lead Channel Pacing Threshold Pulse Width: 0.4 ms
Lead Channel Sensing Intrinsic Amplitude: 1.1 mV
Lead Channel Sensing Intrinsic Amplitude: 12 mV
Lead Channel Setting Pacing Amplitude: 2 V
Lead Channel Setting Pacing Amplitude: 2.5 V
Lead Channel Setting Pacing Pulse Width: 0.4 ms
Lead Channel Setting Sensing Sensitivity: 2 mV
MDC IDC LEAD IMPLANT DT: 20091109
MDC IDC LEAD LOCATION: 753860
MDC IDC MSMT BATTERY REMAINING PERCENTAGE: 95.5 %
MDC IDC MSMT BATTERY VOLTAGE: 3.01 V
MDC IDC MSMT LEADCHNL RA PACING THRESHOLD AMPLITUDE: 1 V
MDC IDC MSMT LEADCHNL RA PACING THRESHOLD PULSEWIDTH: 0.4 ms
MDC IDC PG IMPLANT DT: 20170227
MDC IDC PG SERIAL: 7885781
MDC IDC SESS DTM: 20180228070033
MDC IDC STAT BRADY RA PERCENT PACED: 87 %
Pulse Gen Model: 2240

## 2016-09-03 DIAGNOSIS — N3020 Other chronic cystitis without hematuria: Secondary | ICD-10-CM | POA: Diagnosis not present

## 2016-09-20 DIAGNOSIS — M25572 Pain in left ankle and joints of left foot: Secondary | ICD-10-CM | POA: Diagnosis not present

## 2016-10-01 ENCOUNTER — Encounter: Payer: Self-pay | Admitting: Nurse Practitioner

## 2016-10-01 NOTE — Progress Notes (Signed)
Electrophysiology Office Note Date: 10/09/2016  ID:  [name], DOB 07/16/1927, MRN [id number]  PCP: Aretta Nip, MD Primary Cardiologist: Meda Coffee Electrophysiologist: Allred  CC: Pacemaker follow-up  [name] is a 83 y.o. female seen today for Dr Rayann Heman.  She presents today for routine electrophysiology followup.  Since last being seen in our clinic, the patient reports doing reasonably well.  She is mostly sedentary at home.  She denies chest pain, palpitations, dyspnea, PND, orthopnea, nausea, vomiting, dizziness, syncope, edema, weight gain, or early satiety.  Device History: Biotronik dual chamber PPM implanted 2009 for tachy/brady; gen change 2017 to STJ PPM   Past Medical History:  Diagnosis Date  . Brady-tachy syndrome ([place])    a. Biotronik dual chamber PPM implanted 2009 b. gen change to STJ dual chamber PPM 2017  . Hair loss    Patient questioned Coumadin, changed toPradaxa  . Hypertension   . Hypothyroidism   . Persistent atrial fibrillation [facility])    Past Surgical History:  Procedure Laterality Date  . ABDOMINAL HYSTERECTOMY    . EP IMPLANTABLE DEVICE N/A 08/08/2015   Generator change with SJM Assurity DR PPM by Dr Rayann Heman  . JOINT REPLACEMENT    . PACEMAKER PLACEMENT  2009        Current Outpatient Prescriptions  Medication Sig Dispense Refill  . Calcium Carbonate-Vit D-Min (CALCIUM 600+D PLUS MINERALS) 600-400 MG-UNIT TABS Take 1 tablet by mouth daily.     . Cholecalciferol (VITAMIN D) 400 UNITS capsule Take 400 Units by mouth daily.     Marland Kitchen CRANBERRY PO Take 2 capsules by mouth daily.     . dabigatran (PRADAXA) 150 MG CAPS capsule Take 1 capsule (150 mg total) by mouth every 12 (twelve) hours. Resume pradaxa 2/29/17 am 180 capsule 3  . diclofenac sodium (VOLTAREN) 1 % GEL Apply 1 application topically daily as needed (pain).     Marland Kitchen diltiazem (CARDIZEM CD) 120 MG 24 hr capsule Take 1 capsule (120 mg total) by mouth daily. 90 capsule 3  . flecainide  (TAMBOCOR) 100 MG tablet Take 1 tablet (100 mg total) by mouth 2 (two) times daily. 180 tablet 3  . fluticasone (FLONASE) 50 MCG/ACT nasal spray Place 2 sprays into both nostrils daily as needed for allergies or rhinitis.    Marland Kitchen levothyroxine (SYNTHROID, LEVOTHROID) 137 MCG tablet Take 137 mcg by mouth daily.  0  . metoprolol succinate (TOPROL-XL) 25 MG 24 hr tablet Take 1 tablet (25 mg total) by mouth daily. 90 tablet 3  . Multiple Vitamins-Minerals (OCUVITE PO) Take 1 tablet by mouth daily.     . potassium chloride SA (K-DUR,KLOR-CON) 20 MEQ tablet Take 1 tablet (20 mEq total) by mouth daily. 90 tablet 3  . Probiotic Product (PROBIOTIC PO) Take 1 tablet by mouth daily.    . hydrochlorothiazide (HYDRODIURIL) 25 MG tablet Take 1 tablet (25 mg total) by mouth daily. 90 tablet 3   No current facility-administered medications for this visit.     Allergies:   Morphine and related; Penicillins; Lasix [furosemide]; Clindamycin/lincomycin; Erythromycin; Lisinopril; and Penicillins cross reactors   Social History: Social History   Social History  . Marital status: Widowed    Spouse name: N/A  . Number of children: N/A  . Years of education: N/A   Occupational History  . Not on file.   Social History Main Topics  . Smoking status: Former Smoker    Quit date: 09/06/1990  . Smokeless tobacco: Never Used  .  Alcohol use No  . Drug use: No  . Sexual activity: Not on file   Other Topics Concern  . Not on file   Social History Narrative  . No narrative on file    Family History: Family History  Problem Relation Age of Onset  . Heart disease Father   . Coronary artery disease    . Heart disease Son      Review of Systems: All other systems reviewed and are otherwise negative except as noted above.   Physical Exam: VS:  BP (!) 140/50   Pulse 65   Ht 5\' 3"  (1.6 m)   Wt 203 lb (92.1 kg)   SpO2 95%   BMI 35.96 kg/m  , BMI Body mass index is 35.96 kg/m.  GEN- The patient is  elderly and obese appearing, alert and oriented x 3 today.   HEENT: normocephalic, atraumatic; sclera clear, conjunctiva pink; hearing intact; oropharynx clear; neck supple Lungs- Clear to ausculation bilaterally, normal work of breathing.  No wheezes, rales, rhonchi Heart- Regular rate and rhythm GI- soft, non-tender, non-distended, bowel sounds present Extremities- no clubbing, cyanosis, or edema MS- no significant deformity or atrophy Skin- warm and dry, no rash or lesion; PPM pocket well healed Psych- euthymic mood, full affect Neuro- strength and sensation are intact  PPM Interrogation- reviewed in detail today,  See PACEART report  EKG:  EKG is not ordered today.  Recent Labs: No results found for requested labs within last 8760 hours.   Wt Readings from Last 3 Encounters:  10/09/16 203 lb (92.1 kg)  03/08/16 206 lb (93.4 kg)  11/09/15 208 lb (94.3 kg)     Other studies Reviewed: Additional studies/ records that were reviewed today include: Dr. Jackalyn Lombard office notes  Assessment and Plan:  1.  Tachy/brady syndrome Normal PPM function See Pace Art report No changes today  2.  Paroxysmal atrial fibrillation  Burden by device interrogation 0% Continue flecainide  Continue Pradaxa for CHADS2VASC of 4 CBC, BMET today   3.  HTN Stable No change required today  4.  Chronic diastolic heart failure Euvolemic Diet reviewed Continue current therapy   Current medicines are reviewed at length with the patient today.   The patient does not have concerns regarding her medicines.  The following changes were made today:  none  Labs/ tests ordered today include: CBC, BMET Orders Placed This Encounter  Procedures  . Basic Metabolic Panel (BMET)  . CBC w/Diff  . CUP PACEART INCLINIC DEVICE CHECK     Disposition:   Follow up with Delilah Shan, Dr Rayann Heman 1 year     Signed, Chanetta Marshall, NP 10/09/2016 10:06 AM  [facility] [address] [place] [address] [phone] (office) [phone] (fax)

## 2016-10-02 DIAGNOSIS — E039 Hypothyroidism, unspecified: Secondary | ICD-10-CM | POA: Diagnosis not present

## 2016-10-09 ENCOUNTER — Ambulatory Visit (INDEPENDENT_AMBULATORY_CARE_PROVIDER_SITE_OTHER): Payer: Medicare Other | Admitting: Nurse Practitioner

## 2016-10-09 ENCOUNTER — Encounter: Payer: Self-pay | Admitting: Nurse Practitioner

## 2016-10-09 VITALS — BP 140/50 | HR 65 | Ht 63.0 in | Wt 203.0 lb

## 2016-10-09 DIAGNOSIS — I10 Essential (primary) hypertension: Secondary | ICD-10-CM

## 2016-10-09 DIAGNOSIS — I481 Persistent atrial fibrillation: Secondary | ICD-10-CM

## 2016-10-09 DIAGNOSIS — I495 Sick sinus syndrome: Secondary | ICD-10-CM | POA: Diagnosis not present

## 2016-10-09 DIAGNOSIS — I4819 Other persistent atrial fibrillation: Secondary | ICD-10-CM

## 2016-10-09 DIAGNOSIS — I5032 Chronic diastolic (congestive) heart failure: Secondary | ICD-10-CM | POA: Diagnosis not present

## 2016-10-09 LAB — BASIC METABOLIC PANEL
BUN/Creatinine Ratio: 16 (ref 12–28)
BUN: 15 mg/dL (ref 8–27)
CALCIUM: 9.8 mg/dL (ref 8.7–10.3)
CO2: 24 mmol/L (ref 18–29)
Chloride: 97 mmol/L (ref 96–106)
Creatinine, Ser: 0.94 mg/dL (ref 0.57–1.00)
GFR, EST AFRICAN AMERICAN: 63 mL/min/{1.73_m2} (ref >59)
GFR, EST NON AFRICAN AMERICAN: 55 mL/min/{1.73_m2} — AB (ref >59)
Glucose: 108 mg/dL — ABNORMAL HIGH (ref 65–99)
Potassium: 3.9 mmol/L (ref 3.5–5.2)
Sodium: 140 mmol/L (ref 134–144)

## 2016-10-09 LAB — CBC WITH DIFFERENTIAL/PLATELET
BASOS ABS: 0.1 10*3/uL (ref 0.0–0.2)
BASOS: 2 %
EOS (ABSOLUTE): 0.4 10*3/uL (ref 0.0–0.4)
Eos: 6 %
Hematocrit: 42.4 % (ref 34.0–46.6)
Hemoglobin: 14.9 g/dL (ref 11.1–15.9)
IMMATURE GRANS (ABS): 0 10*3/uL (ref 0.0–0.1)
Immature Granulocytes: 0 %
LYMPHS: 31 %
Lymphocytes Absolute: 2 10*3/uL (ref 0.7–3.1)
MCH: 32.8 pg (ref 26.6–33.0)
MCHC: 35.1 g/dL (ref 31.5–35.7)
MCV: 93 fL (ref 79–97)
MONOCYTES: 13 %
Monocytes Absolute: 0.8 10*3/uL (ref 0.1–0.9)
NEUTROS ABS: 3.1 10*3/uL (ref 1.4–7.0)
NEUTROS PCT: 48 %
PLATELETS: 179 10*3/uL (ref 150–379)
RBC: 4.54 x10E6/uL (ref 3.77–5.28)
RDW: 12.8 % (ref 12.3–15.4)
WBC: 6.3 10*3/uL (ref 3.4–10.8)

## 2016-10-09 LAB — CUP PACEART INCLINIC DEVICE CHECK
Implantable Lead Implant Date: 20091109
Implantable Lead Implant Date: 20091109
Implantable Lead Location: 753859
Implantable Lead Model: 350
Implantable Lead Serial Number: 28411861
Implantable Pulse Generator Implant Date: 20170227
MDC IDC LEAD LOCATION: 753860
MDC IDC LEAD SERIAL: 24891460
MDC IDC PG SERIAL: 7885781
MDC IDC SESS DTM: 20180501091150

## 2016-10-09 NOTE — Patient Instructions (Addendum)
Medication Instructions: Your physician recommends that you continue on your current medications as directed. Please refer to the Current Medication list given to you today.  Labwork: Your physician recommends that you have lab work today : CBC and BMET  Procedures/Testing: None Orderd  Follow-Up: Remote monitoring is used to monitor your Pacemaker from home. This monitoring reduces the number of office visits required to check your device to one time per year. It allows [place] to keep an eye on the functioning of your device to ensure it is working properly. You are scheduled for a device check from home on 01/08/17. You may send your transmission at any time that day. If you have a wireless device, the transmission will be sent automatically. After your physician reviews your transmission, you will receive a postcard with your next transmission date.   Your physician wants you to follow-up in 1 YEAR with Dr. Rayann Heman. You will receive a reminder letter in the mail two months in advance. If you don't receive a letter, please call our office to schedule the follow-up appointment.   Any Additional Special Instructions Will Be Listed Below (If Applicable).   If you need a refill on your cardiac medications before your next appointment, please call your pharmacy.

## 2016-10-31 DIAGNOSIS — L820 Inflamed seborrheic keratosis: Secondary | ICD-10-CM | POA: Diagnosis not present

## 2016-10-31 DIAGNOSIS — L905 Scar conditions and fibrosis of skin: Secondary | ICD-10-CM | POA: Diagnosis not present

## 2016-10-31 DIAGNOSIS — Z85828 Personal history of other malignant neoplasm of skin: Secondary | ICD-10-CM | POA: Diagnosis not present

## 2016-11-23 ENCOUNTER — Ambulatory Visit: Payer: Medicare Other | Admitting: Cardiology

## 2016-12-17 DIAGNOSIS — L218 Other seborrheic dermatitis: Secondary | ICD-10-CM | POA: Diagnosis not present

## 2016-12-24 ENCOUNTER — Telehealth: Payer: Self-pay | Admitting: Cardiology

## 2016-12-24 MED ORDER — HYDROCHLOROTHIAZIDE 25 MG PO TABS: 25.0000 mg | ORAL_TABLET | Freq: Two times a day (BID) | ORAL | 2 refills | Status: DC

## 2016-12-24 NOTE — Telephone Encounter (Signed)
Pt calling to let Dr Meda Coffee know that she increased her HCTZ 25 mg po daily to BID x 5 days, for noted swelling throughout the course of those days.  Pt states that when she increased this med to BID, it worked well for her LEE.  Pt states she is allergic to lasix.  Pt would like to ask Dr Meda Coffee if she could switch her HCTZ 25 mg po daily, to HCTZ 25 mg po BID.  Pt states she will soon be up for a refill of this med.  Advised the pt that she should take this as prescribed, until further recommendations are provided.  Informed the pt that Dr Meda Coffee is out of the office today, but I will route this message to her for further review and recommendation, and follow-up with the pt accordingly, thereafter.  Pt verbalized understanding and agrees with this plan.

## 2016-12-24 NOTE — Telephone Encounter (Signed)
Notified the pt that per Dr Meda Coffee, its ok to switch her to HCTZ 25 mg po BID, and she should be careful with her sodium intake, and use compression stocking as needed.  Confirmed the pharmacy of choice with the pt.  Pt verbalized understanding and agrees with this plan.

## 2016-12-24 NOTE — Telephone Encounter (Signed)
New message    Pt is calling asking for a call back. She needs to ask about one of her medications. Please call.

## 2016-12-24 NOTE — Telephone Encounter (Signed)
That's ok, however she should be very careful about salt intake, no added salt, no chips, no canned food. Also advise use of compression stockings.

## 2017-01-08 ENCOUNTER — Ambulatory Visit (INDEPENDENT_AMBULATORY_CARE_PROVIDER_SITE_OTHER): Payer: Medicare Other | Admitting: *Deleted

## 2017-01-08 DIAGNOSIS — I495 Sick sinus syndrome: Secondary | ICD-10-CM | POA: Diagnosis not present

## 2017-01-08 NOTE — Progress Notes (Signed)
Remote pacemaker transmission.   

## 2017-01-11 ENCOUNTER — Encounter: Payer: Self-pay | Admitting: Cardiology

## 2017-02-01 ENCOUNTER — Encounter: Payer: Self-pay | Admitting: Cardiology

## 2017-02-01 ENCOUNTER — Ambulatory Visit (INDEPENDENT_AMBULATORY_CARE_PROVIDER_SITE_OTHER): Payer: Medicare Other | Admitting: Cardiology

## 2017-02-01 VITALS — BP 124/58 | HR 66 | Ht 63.0 in | Wt 206.0 lb

## 2017-02-01 DIAGNOSIS — Z950 Presence of cardiac pacemaker: Secondary | ICD-10-CM | POA: Diagnosis not present

## 2017-02-01 DIAGNOSIS — I4819 Other persistent atrial fibrillation: Secondary | ICD-10-CM

## 2017-02-01 DIAGNOSIS — I480 Paroxysmal atrial fibrillation: Secondary | ICD-10-CM

## 2017-02-01 DIAGNOSIS — I10 Essential (primary) hypertension: Secondary | ICD-10-CM

## 2017-02-01 DIAGNOSIS — I495 Sick sinus syndrome: Secondary | ICD-10-CM | POA: Diagnosis not present

## 2017-02-01 DIAGNOSIS — E876 Hypokalemia: Secondary | ICD-10-CM | POA: Diagnosis not present

## 2017-02-01 DIAGNOSIS — I481 Persistent atrial fibrillation: Secondary | ICD-10-CM | POA: Diagnosis not present

## 2017-02-01 MED ORDER — DABIGATRAN ETEXILATE MESYLATE 150 MG PO CAPS: 150.0000 mg | ORAL_CAPSULE | Freq: Two times a day (BID) | ORAL | 3 refills | Status: DC

## 2017-02-01 MED ORDER — METOPROLOL SUCCINATE ER 50 MG PO TB24: 50.0000 mg | ORAL_TABLET | Freq: Every day | ORAL | 1 refills | Status: DC

## 2017-02-01 MED ORDER — FLECAINIDE ACETATE 100 MG PO TABS: 100.0000 mg | ORAL_TABLET | Freq: Two times a day (BID) | ORAL | 3 refills | Status: DC

## 2017-02-01 MED ORDER — POTASSIUM CHLORIDE CRYS ER 20 MEQ PO TBCR: 20.0000 meq | EXTENDED_RELEASE_TABLET | Freq: Every day | ORAL | 3 refills | Status: DC

## 2017-02-01 MED ORDER — HYDROCHLOROTHIAZIDE 25 MG PO TABS: 25.0000 mg | ORAL_TABLET | Freq: Two times a day (BID) | ORAL | 2 refills | Status: DC

## 2017-02-01 NOTE — Progress Notes (Signed)
Patient ID: [name], female   DOB: 01/04/1928, 83 y.o.   MRN: [id number]    Cardiology Office Note   Date:  02/03/2017   ID:  [name], DOB Jul 08, 1927, MRN [id number]  PCP:  Aretta Nip, MD  Cardiologist:  Ena Dawley, MD, previously Dr Ron Parker, EP: Dr Rayann Heman  Chief complain: Dizziness, LE edema   History of Present Illness: [name] is a 83 y.o. female who presents today to follow-up atrial fibrillation and mild chronic diastolic CHF. She has known tachy-brady syndrome, s/p PM placement and exchanged planned for 08/02/2015 by Dr Rayann Heman. On long term flecainide. She has been started on Diltiazem in the past that caused her significant LE edema and skin discoloration and rash, it has subsided with decreased dose. She sometimes feels dizzy, but denies syncope. She has been feeling progressively more tired in the last 2 tears, slowly quitting volunteering activities.  Denies chest pain.  03/08/16 - 4 months follow up, feels well, has on and off LE edema, L>R, stable DOE, no chest pain, palpitations or syncope. Compliant with her meds. She feels better since receiving the new PM in 07/2015. No fever, well healed incision site, no drainage. No bleeding with Dabigatran.  02/05/2017 - the patient complains of worsening LE edema, no palpitations, no syncope. No orthopnea, PND. No chest pain, DOE. Complaint with meds.   Past Medical History:  Diagnosis Date  . Brady-tachy syndrome ([place])    a. Biotronik dual chamber PPM implanted 2009 b. gen change to STJ dual chamber PPM 2017  . Hair loss    Patient questioned Coumadin, changed toPradaxa  . Hypertension   . Hypothyroidism   . Persistent atrial fibrillation [facility])     Past Surgical History:  Procedure Laterality Date  . ABDOMINAL HYSTERECTOMY    . EP IMPLANTABLE DEVICE N/A 08/08/2015   Generator change with SJM Assurity DR PPM by Dr Rayann Heman  . JOINT REPLACEMENT    . PACEMAKER PLACEMENT  2009        Patient Active  Problem List   Diagnosis Date Noted  . Aortic stenosis 03/08/2016  . Chronic anticoagulation 12/17/2014  . Mitral regurgitation 05/03/2014  . Edema leg 04/28/2014  . GERD (gastroesophageal reflux disease) 04/28/2014  . Fatigue 12/26/2011  . Ejection fraction   . Normal nuclear stress test   . Nausea   . Brady-tachy syndrome ([place])   . Hypothyroidism   . Paroxysmal atrial fibrillation ([facility])   . Drug therapy   . Drug therapy   . Pacemaker   . Cough   . ESSENTIAL HYPERTENSION, BENIGN 07/20/2010      Current Outpatient Prescriptions  Medication Sig Dispense Refill  . Calcium Carbonate-Vit D-Min (CALCIUM 600+D PLUS MINERALS) 600-400 MG-UNIT TABS Take 1 tablet by mouth daily.     . Cholecalciferol (VITAMIN D) 400 UNITS capsule Take 400 Units by mouth daily.     Marland Kitchen CRANBERRY PO Take 2 capsules by mouth daily.     . dabigatran (PRADAXA) 150 MG CAPS capsule Take 1 capsule (150 mg total) by mouth every 12 (twelve) hours. Resume pradaxa 2/29/17 am 180 capsule 3  . diclofenac sodium (VOLTAREN) 1 % GEL Apply 1 application topically daily as needed (pain).     . flecainide (TAMBOCOR) 100 MG tablet Take 1 tablet (100 mg total) by mouth 2 (two) times daily. 180 tablet 3  . fluticasone (FLONASE) 50 MCG/ACT nasal spray Place 2 sprays into both nostrils daily as needed for allergies or rhinitis.    Marland Kitchen  hydrochlorothiazide (HYDRODIURIL) 25 MG tablet Take 1 tablet (25 mg total) by mouth 2 (two) times daily. 180 tablet 2  . levothyroxine (SYNTHROID, LEVOTHROID) 137 MCG tablet Take 137 mcg by mouth daily.  0  . Multiple Vitamins-Minerals (OCUVITE PO) Take 1 tablet by mouth daily.     . potassium chloride SA (K-DUR,KLOR-CON) 20 MEQ tablet Take 1 tablet (20 mEq total) by mouth daily. 90 tablet 3  . Probiotic Product (PROBIOTIC PO) Take 1 tablet by mouth daily.    . metoprolol succinate (TOPROL-XL) 50 MG 24 hr tablet Take 1 tablet (50 mg total) by mouth daily. Take with or immediately following a meal. 90  tablet 1   No current facility-administered medications for this visit.     Allergies:   Morphine and related; Penicillins; Lasix [furosemide]; Clindamycin/lincomycin; Erythromycin; Lisinopril; and Penicillins cross reactors    Social History:  The patient  reports that she quit smoking about 26 years ago. She has never used smokeless tobacco. She reports that she does not drink alcohol or use drugs.   Family History:  The patient's family history includes Coronary artery disease in her unknown relative; Heart disease in her father and son.    ROS:  Please see the history of present illness.     Patient denies fever, chills, headache, sweats, rash, change in vision, change in hearing, chest pain, cough, nausea or vomiting, urinary symptoms. All other systems are reviewed and are negative.   PHYSICAL EXAM: VS:  BP (!) 124/58   Pulse 66   Ht 5\' 3"  (1.6 m)   Wt 206 lb (93.4 kg)   BMI 36.49 kg/m  , Patient is stable. She is oriented to person time and place. Affect is normal. She is overweight. She has mild chronic peripheral edema. Head is atraumatic. Sclera and conjunctiva are normal. There is no jugular venous distention. Lungs are clear. Respiratory effort is not labored. Cardiac exam reveals S1 and S2. 3/6 systolic murmur at the RUSB.  Abdomen is soft. She does have mild peripheral edema. There are no musculoskeletal deformities. There are no skin rashes. Neurologic is grossly intact.  EKG:   EKG is done today and reviewed by me. The rhythm is paced, intermittent atrial.   Recent Labs: 10/09/2016: BUN 15; Creatinine, Ser 0.94; Hemoglobin 14.9; Platelets 179; Potassium 3.9; Sodium 140    Lipid Panel    Component Value Date/Time   CHOL 165 11/21/2011 1322   TRIG 127 11/21/2011 1322   HDL 42 11/21/2011 1322   CHOLHDL 3.9 11/21/2011 1322   VLDL 25 11/21/2011 1322   LDLCALC 98 11/21/2011 1322      Wt Readings from Last 3 Encounters:  02/01/17 206 lb (93.4 kg)  10/09/16 203 lb  (92.1 kg)  03/08/16 206 lb (93.4 kg)    Current medicines are reviewed  The patient understands her medications.  ECG: Ventricular pacing, non-specific ST- T wave abnormalities, previously av sequential pacing   ASSESSMENT AND PLAN:  1. Tachy/bradycardia syndrome PM exchange in 2/17, interrogated by Dr Rayann Heman in May 2017, functioning well.  2. Afib Well controlled Continue flecainide, d/c cardizem, increase toprol XL to 50 mg po daily  3. HTN Controlled.  4. Acute on Chronic diastolic CHF -with LE edema, d/c cardizem start, continue 20 mg po daily.  5. Systolic murmur - mild aortic stenosis on most recent echo in 04/2016.  Follow up in 6 months.  Ena Dawley 02/03/2017

## 2017-02-01 NOTE — Patient Instructions (Signed)
Medication Instructions:   STOP TAKING CARDIZEM CD (DILTIAZEM) NOW  INCREASE YOUR TOPROL XL (METOPROLOL SUCCINATE) TO 50 MG ONCE DAILY     Follow-Up:  3 MONTHS WITH DR Meda Coffee      If you need a refill on your cardiac medications before your next appointment, please call your pharmacy.

## 2017-02-12 LAB — CUP PACEART REMOTE DEVICE CHECK
Battery Voltage: 2.99 V
Brady Statistic AS VP Percent: 1 %
Brady Statistic RA Percent Paced: 84 %
Date Time Interrogation Session: 20180731060012
Implantable Lead Implant Date: 20091109
Implantable Lead Implant Date: 20091109
Implantable Lead Location: 753859
Implantable Lead Location: 753860
Implantable Lead Model: 350
Implantable Lead Serial Number: 24891460
Lead Channel Impedance Value: 460 Ohm
Lead Channel Pacing Threshold Amplitude: 1 V
Lead Channel Pacing Threshold Amplitude: 1 V
Lead Channel Pacing Threshold Pulse Width: 0.4 ms
Lead Channel Sensing Intrinsic Amplitude: 12 mV
Lead Channel Setting Pacing Amplitude: 2 V
Lead Channel Setting Pacing Amplitude: 2.5 V
Lead Channel Setting Pacing Pulse Width: 0.4 ms
MDC IDC LEAD SERIAL: 28411861
MDC IDC MSMT BATTERY REMAINING LONGEVITY: 127 mo
MDC IDC MSMT BATTERY REMAINING PERCENTAGE: 95.5 %
MDC IDC MSMT LEADCHNL RA SENSING INTR AMPL: 1.4 mV
MDC IDC MSMT LEADCHNL RV IMPEDANCE VALUE: 1575 Ohm
MDC IDC MSMT LEADCHNL RV PACING THRESHOLD PULSEWIDTH: 0.4 ms
MDC IDC PG IMPLANT DT: 20170227
MDC IDC SET LEADCHNL RV SENSING SENSITIVITY: 2 mV
MDC IDC STAT BRADY AP VP PERCENT: 12 %
MDC IDC STAT BRADY AP VS PERCENT: 72 %
MDC IDC STAT BRADY AS VS PERCENT: 16 %
MDC IDC STAT BRADY RV PERCENT PACED: 12 %
Pulse Gen Model: 2240
Pulse Gen Serial Number: 7885781

## 2017-03-18 ENCOUNTER — Telehealth: Payer: Self-pay | Admitting: Cardiology

## 2017-03-18 ENCOUNTER — Other Ambulatory Visit: Payer: Self-pay | Admitting: *Deleted

## 2017-03-18 DIAGNOSIS — I495 Sick sinus syndrome: Secondary | ICD-10-CM

## 2017-03-18 DIAGNOSIS — I4819 Other persistent atrial fibrillation: Secondary | ICD-10-CM

## 2017-03-18 MED ORDER — METOPROLOL SUCCINATE ER 50 MG PO TB24: 50.0000 mg | ORAL_TABLET | Freq: Every day | ORAL | 2 refills | Status: DC

## 2017-03-18 NOTE — Telephone Encounter (Signed)
New message    Pt is calling asking for a call back.    *STAT* If patient is at the pharmacy, call can be transferred to refill team.   1. Which medications need to be refilled? (please list name of each medication and dose if known) metoprolol 50 mg  2. Which pharmacy/location (including street and city if local pharmacy) is medication to be sent to? [facility] by mail  3. Do they need a 30 day or 90 day supply? 90 days

## 2017-03-18 NOTE — Telephone Encounter (Signed)
Spoke with patient to make her aware that I would send the rx for metoprolol to meds by mail as requested. Patient very appreciative.

## 2017-04-03 DIAGNOSIS — Z23 Encounter for immunization: Secondary | ICD-10-CM | POA: Diagnosis not present

## 2017-04-03 DIAGNOSIS — I10 Essential (primary) hypertension: Secondary | ICD-10-CM | POA: Diagnosis not present

## 2017-04-03 DIAGNOSIS — E039 Hypothyroidism, unspecified: Secondary | ICD-10-CM | POA: Diagnosis not present

## 2017-04-09 ENCOUNTER — Ambulatory Visit (INDEPENDENT_AMBULATORY_CARE_PROVIDER_SITE_OTHER): Payer: Medicare Other | Admitting: *Deleted

## 2017-04-09 DIAGNOSIS — I495 Sick sinus syndrome: Secondary | ICD-10-CM | POA: Diagnosis not present

## 2017-04-09 NOTE — Progress Notes (Signed)
Remote pacemaker transmission.   

## 2017-04-17 ENCOUNTER — Encounter: Payer: Self-pay | Admitting: Cardiology

## 2017-04-18 LAB — CUP PACEART REMOTE DEVICE CHECK
Battery Remaining Percentage: 95.5 %
Battery Voltage: 2.99 V
Brady Statistic AP VP Percent: 12 %
Brady Statistic AP VS Percent: 77 %
Brady Statistic AS VS Percent: 11 %
Brady Statistic RV Percent Paced: 12 %
Implantable Lead Implant Date: 20091109
Implantable Lead Location: 753860
Implantable Lead Serial Number: 28411861
Implantable Pulse Generator Implant Date: 20170227
Lead Channel Impedance Value: 1550 Ohm
Lead Channel Pacing Threshold Amplitude: 1 V
Lead Channel Sensing Intrinsic Amplitude: 2 mV
Lead Channel Setting Sensing Sensitivity: 2 mV
MDC IDC LEAD IMPLANT DT: 20091109
MDC IDC LEAD LOCATION: 753859
MDC IDC LEAD SERIAL: 24891460
MDC IDC MSMT BATTERY REMAINING LONGEVITY: 126 mo
MDC IDC MSMT LEADCHNL RA IMPEDANCE VALUE: 460 Ohm
MDC IDC MSMT LEADCHNL RA PACING THRESHOLD PULSEWIDTH: 0.4 ms
MDC IDC MSMT LEADCHNL RV PACING THRESHOLD AMPLITUDE: 1 V
MDC IDC MSMT LEADCHNL RV PACING THRESHOLD PULSEWIDTH: 0.4 ms
MDC IDC MSMT LEADCHNL RV SENSING INTR AMPL: 12 mV
MDC IDC PG SERIAL: 7885781
MDC IDC SESS DTM: 20181030073517
MDC IDC SET LEADCHNL RA PACING AMPLITUDE: 2 V
MDC IDC SET LEADCHNL RV PACING AMPLITUDE: 2.5 V
MDC IDC SET LEADCHNL RV PACING PULSEWIDTH: 0.4 ms
MDC IDC STAT BRADY AS VP PERCENT: 1 %
MDC IDC STAT BRADY RA PERCENT PACED: 89 %

## 2017-04-23 DIAGNOSIS — L820 Inflamed seborrheic keratosis: Secondary | ICD-10-CM | POA: Diagnosis not present

## 2017-04-23 DIAGNOSIS — B078 Other viral warts: Secondary | ICD-10-CM | POA: Diagnosis not present

## 2017-04-30 DIAGNOSIS — I4819 Other persistent atrial fibrillation: Secondary | ICD-10-CM | POA: Insufficient documentation

## 2017-04-30 DIAGNOSIS — L659 Nonscarring hair loss, unspecified: Secondary | ICD-10-CM | POA: Insufficient documentation

## 2017-04-30 DIAGNOSIS — I10 Essential (primary) hypertension: Secondary | ICD-10-CM | POA: Insufficient documentation

## 2017-05-13 ENCOUNTER — Ambulatory Visit (INDEPENDENT_AMBULATORY_CARE_PROVIDER_SITE_OTHER): Payer: Medicare Other | Admitting: Cardiology

## 2017-05-13 ENCOUNTER — Encounter: Payer: Self-pay | Admitting: Cardiology

## 2017-05-13 VITALS — BP 140/72 | HR 66 | Ht 63.0 in | Wt 207.0 lb

## 2017-05-13 DIAGNOSIS — I10 Essential (primary) hypertension: Secondary | ICD-10-CM | POA: Diagnosis not present

## 2017-05-13 DIAGNOSIS — I495 Sick sinus syndrome: Secondary | ICD-10-CM | POA: Diagnosis not present

## 2017-05-13 DIAGNOSIS — Z7901 Long term (current) use of anticoagulants: Secondary | ICD-10-CM

## 2017-05-13 DIAGNOSIS — I480 Paroxysmal atrial fibrillation: Secondary | ICD-10-CM | POA: Diagnosis not present

## 2017-05-13 DIAGNOSIS — Z950 Presence of cardiac pacemaker: Secondary | ICD-10-CM

## 2017-05-13 NOTE — Progress Notes (Signed)
Patient ID: [name], female   DOB: 1927/09/05, 83 y.o.   MRN: [id number]    Cardiology Office Note   Date:  05/13/2017   ID:  [name], DOB [birth year], MRN [id number]  PCP:  Aretta Nip, MD  Cardiologist:  Ena Dawley, MD, previously Dr Ron Parker, EP: Dr Rayann Heman  Chief complain: LE edema   History of Present Illness: [name] is a 83 y.o. female who presents today to follow-up atrial fibrillation and mild chronic diastolic CHF. She has known tachy-brady syndrome, s/p PM placement and exchanged planned for 08/02/2015 by Dr Rayann Heman. On long term flecainide. She has been started on Diltiazem in the past that caused her significant LE edema and skin discoloration and rash, it has subsided with decreased dose. She sometimes feels dizzy, but denies syncope. She has been feeling progressively more tired in the last 2 tears, slowly quitting volunteering activities.  Denies chest pain.  03/08/16 - 4 months follow up, feels well, has on and off LE edema, L>R, stable DOE, no chest pain, palpitations or syncope. Compliant with her meds. She feels better since receiving the new PM in 07/2015. No fever, well healed incision site, no drainage. No bleeding with Dabigatran.  02/05/2017 - the patient complains of worsening LE edema, no palpitations, no syncope. No orthopnea, PND. No chest pain, DOE. Complaint with meds.  05/13/2017 - the patient is coming after 3 months, at the last visit I discontinued her Cardizem at her lower extremity edema has significantly improved as well as discolorations. She continues to be fully active, she walks with a cane, denies any palpitations dizziness or syncope. She has no orthopnea or personal nocturnal dyspnea. No bleeding with Pradaxa.   Past Medical History:  Diagnosis Date  . Brady-tachy syndrome ([place])    a. Biotronik dual chamber PPM implanted 2009 b. gen change to STJ dual chamber PPM 2017  . Hair loss    Patient questioned Coumadin, changed  toPradaxa  . Hypertension   . Hypothyroidism   . Persistent atrial fibrillation [facility])    Past Surgical History:  Procedure Laterality Date  . ABDOMINAL HYSTERECTOMY    . EP IMPLANTABLE DEVICE N/A 08/08/2015   Generator change with SJM Assurity DR PPM by Dr Rayann Heman  . JOINT REPLACEMENT    . PACEMAKER PLACEMENT  2009       Patient Active Problem List   Diagnosis Date Noted  . Persistent atrial fibrillation ([place])   . Hypertension   . Hair loss   . Aortic stenosis 03/08/2016  . Chronic anticoagulation 12/17/2014  . Mitral regurgitation 05/03/2014  . Edema leg 04/28/2014  . GERD (gastroesophageal reflux disease) 04/28/2014  . Fatigue 12/26/2011  . Ejection fraction   . Normal nuclear stress test   . Nausea   . Brady-tachy syndrome ([place])   . Hypothyroidism   . Paroxysmal atrial fibrillation ([facility])   . Drug therapy   . Drug therapy   . Pacemaker   . Cough   . ESSENTIAL HYPERTENSION, BENIGN 07/20/2010    Current Outpatient Medications  Medication Sig Dispense Refill  . Calcium Carbonate-Vit D-Min (CALCIUM 600+D PLUS MINERALS) 600-400 MG-UNIT TABS Take 1 tablet by mouth daily.     . Cholecalciferol (VITAMIN D) 400 UNITS capsule Take 400 Units by mouth daily.     Marland Kitchen CRANBERRY PO Take 2 capsules by mouth daily.     . dabigatran (PRADAXA) 150 MG CAPS capsule Take 1 capsule (150 mg total) by mouth every 12 (twelve)  hours. Resume pradaxa 2/29/17 am 180 capsule 3  . diclofenac sodium (VOLTAREN) 1 % GEL Apply 1 application topically daily as needed (pain).     . flecainide (TAMBOCOR) 100 MG tablet Take 1 tablet (100 mg total) by mouth 2 (two) times daily. 180 tablet 3  . fluticasone (FLONASE) 50 MCG/ACT nasal spray Place 2 sprays into both nostrils daily as needed for allergies or rhinitis.    Marland Kitchen levothyroxine (SYNTHROID, LEVOTHROID) 137 MCG tablet Take 137 mcg by mouth daily.  0  . metoprolol succinate (TOPROL-XL) 50 MG 24 hr tablet Take 1 tablet (50 mg total) by mouth daily. Take with or  immediately following a meal. 90 tablet 2  . Multiple Vitamins-Minerals (OCUVITE PO) Take 1 tablet by mouth daily.     . potassium chloride SA (K-DUR,KLOR-CON) 20 MEQ tablet Take 1 tablet (20 mEq total) by mouth daily. 90 tablet 3  . Probiotic Product (PROBIOTIC PO) Take 1 tablet by mouth daily.    . hydrochlorothiazide (HYDRODIURIL) 25 MG tablet Take 1 tablet (25 mg total) by mouth 2 (two) times daily. 180 tablet 2   No current facility-administered medications for this visit.    Allergies:   Morphine and related; Penicillins; Lasix [furosemide]; Clindamycin/lincomycin; Erythromycin; Lisinopril; and Penicillins cross reactors   Social History:  The patient  reports that she quit smoking about 26 years ago. she has never used smokeless tobacco. She reports that she does not drink alcohol or use drugs.   Family History:  The patient's family history includes Coronary artery disease in her unknown relative; Heart disease in her father and son.   ROS:  Please see the history of present illness.     Patient denies fever, chills, headache, sweats, rash, change in vision, change in hearing, chest pain, cough, nausea or vomiting, urinary symptoms. All other systems are reviewed and are negative.  PHYSICAL EXAM: VS:  BP 140/72   Pulse 66   Ht 5\' 3"  (1.6 m)   Wt 207 lb (93.9 kg)   SpO2 97%   BMI 36.67 kg/m  , Patient is stable. She is oriented to person time and place. Affect is normal. She is overweight. She has mild chronic peripheral edema predominantly around the ankles. Head is atraumatic. Sclera and conjunctiva are normal. There is no jugular venous distention. Lungs are clear. Respiratory effort is not labored. Cardiac exam reveals S1 and S2. 3/6 systolic murmur at the RUSB.  Abdomen is soft. She does have mild peripheral edema. There are no musculoskeletal deformities. There are no skin rashes. Neurologic is grossly intact.  EKG:   EKG is done today and reviewed by me. The rhythm is paced,  intermittent atrial.  Recent Labs: 10/09/2016: BUN 15; Creatinine, Ser 0.94; Hemoglobin 14.9; Platelets 179; Potassium 3.9; Sodium 140   Lipid Panel    Component Value Date/Time   CHOL 165 11/21/2011 1322   TRIG 127 11/21/2011 1322   HDL 42 11/21/2011 1322   CHOLHDL 3.9 11/21/2011 1322   VLDL 25 11/21/2011 1322   LDLCALC 98 11/21/2011 1322   Wt Readings from Last 3 Encounters:  05/13/17 207 lb (93.9 kg)  02/01/17 206 lb (93.4 kg)  10/09/16 203 lb (92.1 kg)    Current medicines are reviewed  The patient understands her medications.  ECG: not done today   ASSESSMENT AND PLAN:  1. Tachy/bradycardia syndrome PM exchange in 2/17, followed by Dr Rayann Heman in May 2017, functioning well.  2. Afib Well controlled Continue flecainide, off cardizem, increased toprol  XL to 50 mg po daily  3. HTN Controlled. Normal at home.  4. Acute on Chronic diastolic CHF - with LE edema, improved significantly with discontinuation of cardizem start. Unable to use Lasix as she previously had itching from it.  5. Systolic murmur - mild aortic stenosis on most recent echo in 04/2016. Unchanged murmur today. We will just follow for now.  Follow up in 6 months.  Ena Dawley 05/13/2017

## 2017-05-13 NOTE — Patient Instructions (Signed)
Medication Instructions:   Your physician recommends that you continue on your current medications as directed. Please refer to the Current Medication list given to you today.    Labwork:  TODAY--CMET, CBC W DIFF, TSH, AND LIPIDS      Follow-Up:  Your physician wants you to follow-up in: 6 MONTHS WITH DR NELSON You will receive a reminder letter in the mail two months in advance. If you don't receive a letter, please call our office to schedule the follow-up appointment.        If you need a refill on your cardiac medications before your next appointment, please call your pharmacy.   

## 2017-05-14 LAB — CBC WITH DIFFERENTIAL/PLATELET
Basophils Absolute: 0.1 10*3/uL (ref 0.0–0.2)
Basos: 1 %
EOS (ABSOLUTE): 0.5 10*3/uL — ABNORMAL HIGH (ref 0.0–0.4)
Eos: 7 %
Hematocrit: 42.5 % (ref 34.0–46.6)
Hemoglobin: 14.2 g/dL (ref 11.1–15.9)
Immature Grans (Abs): 0 10*3/uL (ref 0.0–0.1)
Immature Granulocytes: 0 %
Lymphocytes Absolute: 2.1 10*3/uL (ref 0.7–3.1)
Lymphs: 33 %
MCH: 31.7 pg (ref 26.6–33.0)
MCHC: 33.4 g/dL (ref 31.5–35.7)
MCV: 95 fL (ref 79–97)
Monocytes Absolute: 0.7 10*3/uL (ref 0.1–0.9)
Monocytes: 12 %
Neutrophils Absolute: 2.9 10*3/uL (ref 1.4–7.0)
Neutrophils: 47 %
Platelets: 176 10*3/uL (ref 150–379)
RBC: 4.48 x10E6/uL (ref 3.77–5.28)
RDW: 13.6 % (ref 12.3–15.4)
WBC: 6.3 10*3/uL (ref 3.4–10.8)

## 2017-05-14 LAB — LIPID PANEL
Chol/HDL Ratio: 4.7 ratio — ABNORMAL HIGH (ref 0.0–4.4)
Cholesterol, Total: 193 mg/dL (ref 100–199)
HDL: 41 mg/dL (ref >39)
LDL Calculated: 122 mg/dL — ABNORMAL HIGH (ref 0–99)
Triglycerides: 152 mg/dL — ABNORMAL HIGH (ref 0–149)
VLDL Cholesterol Cal: 30 mg/dL (ref 5–40)

## 2017-05-14 LAB — COMPREHENSIVE METABOLIC PANEL
ALT: 22 IU/L (ref 0–32)
AST: 31 IU/L (ref 0–40)
Albumin/Globulin Ratio: 1.4 (ref 1.2–2.2)
Albumin: 4.2 g/dL (ref 3.5–4.7)
Alkaline Phosphatase: 47 IU/L (ref 39–117)
BUN/Creatinine Ratio: 18 (ref 12–28)
BUN: 17 mg/dL (ref 8–27)
Bilirubin Total: 0.6 mg/dL (ref 0.0–1.2)
CO2: 27 mmol/L (ref 20–29)
Calcium: 9.8 mg/dL (ref 8.7–10.3)
Chloride: 101 mmol/L (ref 96–106)
Creatinine, Ser: 0.97 mg/dL (ref 0.57–1.00)
GFR calc Af Amer: 61 mL/min/{1.73_m2} (ref >59)
GFR calc non Af Amer: 53 mL/min/{1.73_m2} — ABNORMAL LOW (ref >59)
Globulin, Total: 2.9 g/dL (ref 1.5–4.5)
Glucose: 103 mg/dL — ABNORMAL HIGH (ref 65–99)
Potassium: 4.2 mmol/L (ref 3.5–5.2)
Sodium: 145 mmol/L — ABNORMAL HIGH (ref 134–144)
Total Protein: 7.1 g/dL (ref 6.0–8.5)

## 2017-05-14 LAB — TSH: TSH: 1.29 u[IU]/mL (ref 0.450–4.500)

## 2017-05-15 ENCOUNTER — Telehealth: Payer: Self-pay | Admitting: *Deleted

## 2017-05-15 MED ORDER — CRESTOR 5 MG PO TABS: 5.0000 mg | ORAL_TABLET | ORAL | 1 refills | Status: DC

## 2017-05-15 NOTE — Telephone Encounter (Signed)
Notified the pt that per Dr Meda Coffee, her labs showed normal cbc, cmet, TSH, elevated lipids, and she recommends that she start taking Crestor 5 mg po three times weekly, take on Tuesdays, Thursdays, and Saturdays.  Confirmed the pharmacy of choice with the pt.  Pt verbalized understanding and agrees with this plan.

## 2017-05-15 NOTE — Telephone Encounter (Signed)
-----   Message from Dorothy Spark, MD sent at 05/15/2017  1:42 PM EST ----- Normal CBC, CMP, TSH, elevated lipids - I would advise to start taking crestor 5 mg on Tuesdays, thursdays and Saturdays, [place]

## 2017-06-10 DIAGNOSIS — E039 Hypothyroidism, unspecified: Secondary | ICD-10-CM | POA: Diagnosis not present

## 2017-07-09 ENCOUNTER — Ambulatory Visit (INDEPENDENT_AMBULATORY_CARE_PROVIDER_SITE_OTHER): Payer: Medicare Other | Admitting: *Deleted

## 2017-07-09 DIAGNOSIS — I495 Sick sinus syndrome: Secondary | ICD-10-CM

## 2017-07-09 NOTE — Progress Notes (Signed)
Remote pacemaker transmission.   

## 2017-07-11 ENCOUNTER — Encounter: Payer: Self-pay | Admitting: Cardiology

## 2017-07-24 LAB — CUP PACEART REMOTE DEVICE CHECK
Battery Remaining Percentage: 95.5 %
Battery Voltage: 2.99 V
Brady Statistic AP VP Percent: 13 %
Brady Statistic RA Percent Paced: 92 %
Brady Statistic RV Percent Paced: 13 %
Date Time Interrogation Session: 20190129082115
Implantable Lead Location: 753859
Implantable Lead Location: 753860
Implantable Lead Model: 350
Implantable Lead Serial Number: 24891460
Implantable Pulse Generator Implant Date: 20170227
Lead Channel Impedance Value: 480 Ohm
Lead Channel Pacing Threshold Pulse Width: 0.4 ms
Lead Channel Setting Pacing Amplitude: 2 V
Lead Channel Setting Pacing Amplitude: 2.5 V
Lead Channel Setting Pacing Pulse Width: 0.4 ms
Lead Channel Setting Sensing Sensitivity: 2 mV
MDC IDC LEAD IMPLANT DT: 20091109
MDC IDC LEAD IMPLANT DT: 20091109
MDC IDC LEAD SERIAL: 28411861
MDC IDC MSMT BATTERY REMAINING LONGEVITY: 126 mo
MDC IDC MSMT LEADCHNL RA PACING THRESHOLD AMPLITUDE: 1 V
MDC IDC MSMT LEADCHNL RA SENSING INTR AMPL: 1.8 mV
MDC IDC MSMT LEADCHNL RV IMPEDANCE VALUE: 1575 Ohm
MDC IDC MSMT LEADCHNL RV PACING THRESHOLD AMPLITUDE: 1 V
MDC IDC MSMT LEADCHNL RV PACING THRESHOLD PULSEWIDTH: 0.4 ms
MDC IDC MSMT LEADCHNL RV SENSING INTR AMPL: 12 mV
MDC IDC PG SERIAL: 7885781
MDC IDC STAT BRADY AP VS PERCENT: 79 %
MDC IDC STAT BRADY AS VP PERCENT: 1 %
MDC IDC STAT BRADY AS VS PERCENT: 8.2 %
Pulse Gen Model: 2240

## 2017-09-03 DIAGNOSIS — E039 Hypothyroidism, unspecified: Secondary | ICD-10-CM | POA: Diagnosis not present

## 2017-09-18 ENCOUNTER — Other Ambulatory Visit: Payer: Self-pay | Admitting: Cardiology

## 2017-10-08 ENCOUNTER — Ambulatory Visit (INDEPENDENT_AMBULATORY_CARE_PROVIDER_SITE_OTHER): Payer: Medicare Other | Admitting: *Deleted

## 2017-10-08 DIAGNOSIS — I495 Sick sinus syndrome: Secondary | ICD-10-CM | POA: Diagnosis not present

## 2017-10-08 NOTE — Progress Notes (Signed)
Remote pacemaker transmission.   

## 2017-10-09 ENCOUNTER — Encounter: Payer: Self-pay | Admitting: Cardiology

## 2017-10-25 DIAGNOSIS — Z96651 Presence of right artificial knee joint: Secondary | ICD-10-CM | POA: Diagnosis not present

## 2017-10-25 DIAGNOSIS — M1711 Unilateral primary osteoarthritis, right knee: Secondary | ICD-10-CM | POA: Diagnosis not present

## 2017-10-25 DIAGNOSIS — M1712 Unilateral primary osteoarthritis, left knee: Secondary | ICD-10-CM

## 2017-10-25 HISTORY — DX: Unilateral primary osteoarthritis, left knee: M17.12

## 2017-10-29 LAB — CUP PACEART REMOTE DEVICE CHECK
Battery Voltage: 2.99 V
Brady Statistic AP VP Percent: 16 %
Brady Statistic AP VS Percent: 75 %
Brady Statistic AS VP Percent: 1 %
Brady Statistic RA Percent Paced: 91 %
Brady Statistic RV Percent Paced: 16 %
Date Time Interrogation Session: 20190430060017
Implantable Lead Location: 753860
Implantable Lead Model: 350
Implantable Lead Serial Number: 24891460
Implantable Pulse Generator Implant Date: 20170227
Lead Channel Impedance Value: 1575 Ohm
Lead Channel Pacing Threshold Amplitude: 1 V
Lead Channel Pacing Threshold Amplitude: 1 V
Lead Channel Pacing Threshold Pulse Width: 0.4 ms
Lead Channel Sensing Intrinsic Amplitude: 12 mV
Lead Channel Setting Pacing Amplitude: 2 V
Lead Channel Setting Sensing Sensitivity: 2 mV
MDC IDC LEAD IMPLANT DT: 20091109
MDC IDC LEAD IMPLANT DT: 20091109
MDC IDC LEAD LOCATION: 753859
MDC IDC LEAD SERIAL: 28411861
MDC IDC MSMT BATTERY REMAINING LONGEVITY: 126 mo
MDC IDC MSMT BATTERY REMAINING PERCENTAGE: 95.5 %
MDC IDC MSMT LEADCHNL RA IMPEDANCE VALUE: 480 Ohm
MDC IDC MSMT LEADCHNL RA SENSING INTR AMPL: 2.1 mV
MDC IDC MSMT LEADCHNL RV PACING THRESHOLD PULSEWIDTH: 0.4 ms
MDC IDC SET LEADCHNL RV PACING AMPLITUDE: 2.5 V
MDC IDC SET LEADCHNL RV PACING PULSEWIDTH: 0.4 ms
MDC IDC STAT BRADY AS VS PERCENT: 8.7 %
Pulse Gen Model: 2240
Pulse Gen Serial Number: 7885781

## 2017-11-13 ENCOUNTER — Ambulatory Visit (INDEPENDENT_AMBULATORY_CARE_PROVIDER_SITE_OTHER): Payer: Medicare Other | Admitting: Internal Medicine

## 2017-11-13 ENCOUNTER — Encounter: Payer: Self-pay | Admitting: Internal Medicine

## 2017-11-13 ENCOUNTER — Encounter (INDEPENDENT_AMBULATORY_CARE_PROVIDER_SITE_OTHER): Payer: Self-pay

## 2017-11-13 VITALS — BP 124/82 | HR 62 | Ht 63.0 in | Wt 201.0 lb

## 2017-11-13 DIAGNOSIS — I10 Essential (primary) hypertension: Secondary | ICD-10-CM | POA: Diagnosis not present

## 2017-11-13 DIAGNOSIS — Z950 Presence of cardiac pacemaker: Secondary | ICD-10-CM | POA: Diagnosis not present

## 2017-11-13 DIAGNOSIS — I480 Paroxysmal atrial fibrillation: Secondary | ICD-10-CM

## 2017-11-13 DIAGNOSIS — I495 Sick sinus syndrome: Secondary | ICD-10-CM | POA: Diagnosis not present

## 2017-11-13 MED ORDER — FLECAINIDE ACETATE 50 MG PO TABS: 50.0000 mg | ORAL_TABLET | Freq: Two times a day (BID) | ORAL | 3 refills | Status: DC

## 2017-11-13 MED ORDER — FLECAINIDE ACETATE 100 MG PO TABS: 50.0000 mg | ORAL_TABLET | Freq: Two times a day (BID) | ORAL | 3 refills | Status: DC

## 2017-11-13 NOTE — Patient Instructions (Addendum)
Medication Instructions:  Your physician has recommended you make the following change in your medication:  1.  Reduce your flecainide 100 mg tablets- Take 1/2 tablet by mouth twice a day.  Labwork: None ordered.  Testing/Procedures: None ordered.  Follow-Up: Your physician wants you to follow-up in: one year with Chanetta Marshall, NP.  letter in the mail two months in advance. If you don't receive a letter, please call our office to schedule the follow-up appointment.  Remote monitoring is used to monitor your Pacemaker from home. This monitoring reduces the number of office visits required to check your device to one time per year. It allows [place] to keep an eye on the functioning of your device to ensure it is working properly. You are scheduled for a device check from home on 01/07/2018. You may send your transmission at any time that day. If you have a wireless device, the transmission will be sent automatically. After your physician reviews your transmission, you will receive a postcard with your next transmission date.  Any Other Special Instructions Will Be Listed Below (If Applicable).  If you need a refill on your cardiac medications before your next appointment, please call your pharmacy.

## 2017-11-13 NOTE — Progress Notes (Signed)
PCP: Aretta Nip, MD Primary Cardiologist: Dr Meda Coffee Primary EP:  Dr Noland Fordyce is a 84 y.o. female who presents today for routine electrophysiology followup.  Since last being seen in our clinic, the patient reports doing very well.   + stable venous insufficiency/ edema.  Today, she denies symptoms of palpitations, chest pain, shortness of breath,  dizziness, presyncope, or syncope.  The patient is otherwise without complaint today.   Past Medical History:  Diagnosis Date  . Brady-tachy syndrome ([place])    a. Biotronik dual chamber PPM implanted 2009 b. gen change to STJ dual chamber PPM 2017  . Hair loss    Patient questioned Coumadin, changed toPradaxa  . Hypertension   . Hypothyroidism   . Persistent atrial fibrillation [facility])    Past Surgical History:  Procedure Laterality Date  . ABDOMINAL HYSTERECTOMY    . EP IMPLANTABLE DEVICE N/A 08/08/2015   Generator change with SJM Ass[employer] DR PPM by Dr Rayann Heman  . JOINT REPLACEMENT    . PACEMAKER PLACEMENT  2009        ROS- all systems are reviewed and negative except as per HPI above  Current Outpatient Medications  Medication Sig Dispense Refill  . Calcium Carbonate-Vit D-Min (CALCIUM 600+D PLUS MINERALS) 600-400 MG-UNIT TABS Take 1 tablet by mouth daily.     . Cholecalciferol (VITAMIN D) 400 UNITS capsule Take 400 Units by mouth daily.     Marland Kitchen CRANBERRY PO Take 2 capsules by mouth daily.     . CRESTOR 5 MG tablet Take 1 tablet (5 mg total) by mouth 3 (three) times a week. Take on Tues, Thurs, and Saturdays 45 tablet 1  . dabigatran (PRADAXA) 150 MG CAPS capsule Take 1 capsule (150 mg total) by mouth every 12 (twelve) hours. Resume pradaxa 2/29/17 am 180 capsule 3  . diclofenac sodium (VOLTAREN) 1 % GEL Apply 1 application topically daily as needed (pain).     . flecainide (TAMBOCOR) 100 MG tablet Take 1 tablet (100 mg total) by mouth 2 (two) times daily. 180 tablet 3  . fluticasone (FLONASE) 50 MCG/ACT nasal spray  Place 2 sprays into both nostrils daily as needed for allergies or rhinitis.    . hydrochlorothiazide (HYDRODIURIL) 25 MG tablet Take 1 tablet (25 mg total) by mouth 2 (two) times daily. 180 tablet 2  . levothyroxine (SYNTHROID, LEVOTHROID) 137 MCG tablet Take 137 mcg by mouth daily.  0  . metoprolol succinate (TOPROL-XL) 50 MG 24 hr tablet Take 1 tablet (50 mg total) by mouth daily. Take with or immediately following a meal. 90 tablet 2  . Multiple Vitamins-Minerals (OCUVITE PO) Take 1 tablet by mouth daily.     . potassium chloride SA (K-DUR,KLOR-CON) 20 MEQ tablet Take 1 tablet (20 mEq total) by mouth daily. 90 tablet 3  . Probiotic Product (PROBIOTIC PO) Take 1 tablet by mouth daily.     No current facility-administered medications for this visit.     Physical Exam: Vitals:   11/13/17 1103  BP: 124/82  Pulse: 62  Weight: 201 lb (91.2 kg)  Height: 5\' 3"  (1.6 m)    GEN- The patient is well appearing, alert and oriented x 3 today.   Head- normocephalic, atraumatic Eyes-  Sclera clear, conjunctiva pink Ears- hearing intact Oropharynx- clear Lungs- Clear to ausculation bilaterally, normal work of breathing Chest- pacemaker pocket is well healed Heart- Regular rate and rhythm, no murmurs, rubs or gallops, PMI not laterally displaced GI- soft, NT,  ND, + BS Extremities- no clubbing, cyanosis, + dependant edema  Pacemaker interrogation- reviewed in detail today,  See PACEART report  ekg tracing ordered today is personally reviewed and shows sinus rhythm 62 bpm, PR 336 msec, IVCD, diffuse TWI  Assessment and Plan:  1. Symptomatic bradycardia  Normal pacemaker function See Pace Art report No changes today  2. afib Well controlled with flecainide (AF burden < 1 %, longest episode 32 seconds) Given first degree AV block, will reduce flecainide to 50mg  BID  3. HTN Stable No change required today  4. Venous insufficiency Stable No change required today Support hose  advised  Merlin Return to see EP NP annually I will see when needed  Thompson Grayer MD, [facility] 11/13/2017 11:45 AM

## 2017-11-26 LAB — CUP PACEART INCLINIC DEVICE CHECK
Brady Statistic RA Percent Paced: 92 %
Brady Statistic RV Percent Paced: 17 %
Date Time Interrogation Session: 20190618174356
Implantable Lead Location: 753859
Implantable Lead Serial Number: 24891460
Lead Channel Impedance Value: 510 Ohm
Lead Channel Sensing Intrinsic Amplitude: 1.7 mV
Lead Channel Setting Pacing Amplitude: 2.5 V
Lead Channel Setting Pacing Pulse Width: 0.4 ms
MDC IDC LEAD IMPLANT DT: 20091109
MDC IDC LEAD IMPLANT DT: 20091109
MDC IDC LEAD LOCATION: 753860
MDC IDC LEAD SERIAL: 28411861
MDC IDC MSMT LEADCHNL RA PACING THRESHOLD AMPLITUDE: 1 V
MDC IDC MSMT LEADCHNL RA PACING THRESHOLD PULSEWIDTH: 0.4 ms
MDC IDC MSMT LEADCHNL RV IMPEDANCE VALUE: 1600 Ohm
MDC IDC MSMT LEADCHNL RV PACING THRESHOLD AMPLITUDE: 1 V
MDC IDC MSMT LEADCHNL RV PACING THRESHOLD PULSEWIDTH: 0.4 ms
MDC IDC MSMT LEADCHNL RV SENSING INTR AMPL: 12 mV
MDC IDC PG IMPLANT DT: 20170227
MDC IDC SET LEADCHNL RA PACING AMPLITUDE: 2 V
MDC IDC SET LEADCHNL RV SENSING SENSITIVITY: 2 mV
Pulse Gen Model: 2240
Pulse Gen Serial Number: 7885781

## 2017-11-27 ENCOUNTER — Telehealth: Payer: Self-pay | Admitting: Cardiology

## 2017-11-27 NOTE — Telephone Encounter (Signed)
New Message   Pt states she would like to speak with a nurse sue to her esophageus stopped up and it took her 30 minutes for her to keep anything down

## 2017-11-27 NOTE — Telephone Encounter (Signed)
Pt is calling to ask if she should see her PCP for further evaluation of having a hard time swallowing certain foods at mealtime.  Pt states that she feels like its her esophagus or a hernia.  Pt asking if she should get an appt with her PCP first, to make sure she doesn't have anything acute going on? Pt states she does not have a GI MD. Pt has no cardiac complaints. Advised the pt to call her PCP today and try to get an appt for this week for further evaluation of complaints of difficulty swallowing and choking on certain foods.  Informed the pt that her PCP can also refer her to a GI Doctor as well, but can prescribe her some meds to take for temporary relief, until referral appt is made. Informed the pt that I will route this message to Dr Meda Coffee as a general FYI.  Pt verbalized understanding and agrees with this plan. Pt gracious for all the assistance provided.

## 2017-11-29 DIAGNOSIS — R1310 Dysphagia, unspecified: Secondary | ICD-10-CM | POA: Diagnosis not present

## 2017-12-09 ENCOUNTER — Encounter: Payer: Self-pay | Admitting: Cardiology

## 2017-12-23 ENCOUNTER — Ambulatory Visit (INDEPENDENT_AMBULATORY_CARE_PROVIDER_SITE_OTHER): Payer: Medicare Other | Admitting: Cardiology

## 2017-12-23 ENCOUNTER — Encounter: Payer: Self-pay | Admitting: Cardiology

## 2017-12-23 VITALS — BP 138/70 | HR 65 | Ht 63.0 in | Wt 203.0 lb

## 2017-12-23 DIAGNOSIS — I481 Persistent atrial fibrillation: Secondary | ICD-10-CM

## 2017-12-23 DIAGNOSIS — E876 Hypokalemia: Secondary | ICD-10-CM

## 2017-12-23 DIAGNOSIS — I5033 Acute on chronic diastolic (congestive) heart failure: Secondary | ICD-10-CM

## 2017-12-23 DIAGNOSIS — I480 Paroxysmal atrial fibrillation: Secondary | ICD-10-CM | POA: Diagnosis not present

## 2017-12-23 DIAGNOSIS — I10 Essential (primary) hypertension: Secondary | ICD-10-CM

## 2017-12-23 DIAGNOSIS — I4819 Other persistent atrial fibrillation: Secondary | ICD-10-CM

## 2017-12-23 DIAGNOSIS — Z950 Presence of cardiac pacemaker: Secondary | ICD-10-CM

## 2017-12-23 DIAGNOSIS — I350 Nonrheumatic aortic (valve) stenosis: Secondary | ICD-10-CM

## 2017-12-23 MED ORDER — SPIRONOLACTONE 25 MG PO TABS: 12.5000 mg | ORAL_TABLET | Freq: Every day | ORAL | 0 refills | Status: DC

## 2017-12-23 MED ORDER — FLECAINIDE ACETATE 50 MG PO TABS: 50.0000 mg | ORAL_TABLET | Freq: Two times a day (BID) | ORAL | 3 refills | Status: DC

## 2017-12-23 MED ORDER — DABIGATRAN ETEXILATE MESYLATE 150 MG PO CAPS: 150.0000 mg | ORAL_CAPSULE | Freq: Two times a day (BID) | ORAL | 3 refills | Status: DC

## 2017-12-23 NOTE — Progress Notes (Signed)
Patient ID: [name], female   DOB: September 21, 1927, 84 y.o.   MRN: [id number]      Cardiology Office Note   Date:  12/23/2017   ID:  [name], DOB January 31, 1928, MRN [id number] 12/23/2016 PCP:  Aretta Nip, MD  Cardiologist:  Ena Dawley, MD, previously Dr Ron Parker, EP: Dr Rayann Heman  Chief complain: 6 months follow-up   History of Present Illness: [name] is a 84 y.o. female who presents today to follow-up atrial fibrillation and mild chronic diastolic CHF. She has known tachy-brady syndrome, s/p PM placement and exchanged planned for 08/02/2015 by Dr Rayann Heman. On long term flecainide. She has been started on Diltiazem in the past that caused her significant LE edema and skin discoloration and rash, it has subsided with decreased dose. She sometimes feels dizzy, but denies syncope. She has been feeling progressively more tired in the last 2 tears, slowly quitting volunteering activities.  Denies chest pain.  03/08/16 - 4 months follow up, feels well, has on and off LE edema, L>R, stable DOE, no chest pain, palpitations or syncope. Compliant with her meds. She feels better since receiving the new PM in 07/2015. No fever, well healed incision site, no drainage. No bleeding with Dabigatran.  05/13/2017 - the patient is coming after 3 months, at the last visit I discontinued her Cardizem at her lower extremity edema has significantly improved as well as discolorations. She continues to be fully active, she walks with a cane, denies any palpitations dizziness or syncope. She has no orthopnea or personal nocturnal dyspnea. No bleeding with Pradaxa.  December 23, 2017 -this is 6 months follow-up patient was seen by Dr. Rayann Heman in June and pacemaker interrogation showed less than 1% atrial fibrillation burden, she is tolerating flecainide very well.  She continues to have lower extremity edema despite 2 pills of hydrochlorothiazide a day, she is allergic to furosemide, and she denies any orthopnea or  proximal nocturnal dyspnea.  She has no chest pain dizziness or syncope.  Past Medical History:  Diagnosis Date  . Brady-tachy syndrome ([place])    a. Biotronik dual chamber PPM implanted 2009 b. gen change to STJ dual chamber PPM 2017  . Hair loss    Patient questioned Coumadin, changed toPradaxa  . Hypertension   . Hypothyroidism   . Persistent atrial fibrillation [facility])    Past Surgical History:  Procedure Laterality Date  . ABDOMINAL HYSTERECTOMY    . EP IMPLANTABLE DEVICE N/A 08/08/2015   Generator change with SJM Assurity DR PPM by Dr Rayann Heman  . JOINT REPLACEMENT    . PACEMAKER PLACEMENT  2009       Patient Active Problem List   Diagnosis Date Noted  . Persistent atrial fibrillation ([place])   . Hypertension   . Hair loss   . Aortic stenosis 03/08/2016  . Chronic anticoagulation 12/17/2014  . Mitral regurgitation 05/03/2014  . Edema leg 04/28/2014  . GERD (gastroesophageal reflux disease) 04/28/2014  . Fatigue 12/26/2011  . Ejection fraction   . Normal nuclear stress test   . Nausea   . Brady-tachy syndrome ([place])   . Hypothyroidism   . Paroxysmal atrial fibrillation ([facility])   . Drug therapy   . Drug therapy   . Pacemaker   . Cough   . ESSENTIAL HYPERTENSION, BENIGN 07/20/2010    Current Outpatient Medications  Medication Sig Dispense Refill  . Calcium Carbonate-Vit D-Min (CALCIUM 600+D PLUS MINERALS) 600-400 MG-UNIT TABS Take 1 tablet by mouth daily.     Marland Kitchen  Cholecalciferol (VITAMIN D) 400 UNITS capsule Take 400 Units by mouth daily.     Marland Kitchen CRANBERRY PO Take 2 capsules by mouth daily.     . CRESTOR 5 MG tablet Take 1 tablet (5 mg total) by mouth 3 (three) times a week. Take on Tues, Thurs, and Saturdays 45 tablet 1  . dabigatran (PRADAXA) 150 MG CAPS capsule Take 1 capsule (150 mg total) by mouth every 12 (twelve) hours. Resume pradaxa 2/29/17 am 180 capsule 3  . diclofenac sodium (VOLTAREN) 1 % GEL Apply 1 application topically daily as needed (pain).     . flecainide  (TAMBOCOR) 50 MG tablet Take 1 tablet (50 mg total) by mouth 2 (two) times daily. 180 tablet 3  . fluticasone (FLONASE) 50 MCG/ACT nasal spray Place 2 sprays into both nostrils daily as needed for allergies or rhinitis.    . hydrochlorothiazide (HYDRODIURIL) 25 MG tablet Take 1 tablet (25 mg total) by mouth 2 (two) times daily. 180 tablet 2  . levothyroxine (SYNTHROID, LEVOTHROID) 137 MCG tablet Take 137 mcg by mouth daily.  0  . metoprolol succinate (TOPROL-XL) 50 MG 24 hr tablet Take 1 tablet (50 mg total) by mouth daily. Take with or immediately following a meal. 90 tablet 2  . Multiple Vitamins-Minerals (OCUVITE PO) Take 1 tablet by mouth daily.     . potassium chloride SA (K-DUR,KLOR-CON) 20 MEQ tablet Take 1 tablet (20 mEq total) by mouth daily. 90 tablet 3  . Probiotic Product (PROBIOTIC PO) Take 1 tablet by mouth daily.     No current facility-administered medications for this visit.    Allergies:   Morphine and related; Penicillins; Lasix [furosemide]; Clindamycin/lincomycin; Erythromycin; Lisinopril; and Penicillins cross reactors   Social History:  The patient  reports that she quit smoking about 27 years ago. She has never used smokeless tobacco. She reports that she does not drink alcohol or use drugs.   Family History:  The patient's family history includes Coronary artery disease in her unknown relative; Heart disease in her father and son.   ROS:  Please see the history of present illness.     Patient denies fever, chills, headache, sweats, rash, change in vision, change in hearing, chest pain, cough, nausea or vomiting, urinary symptoms. All other systems are reviewed and are negative.  PHYSICAL EXAM: VS:  BP 138/70   Pulse 65   Ht 5\' 3"  (1.6 m)   Wt 203 lb (92.1 kg)   SpO2 98%   BMI 35.96 kg/m  , Patient is stable. She is oriented to person time and place. Affect is normal. She is overweight. She has mild chronic peripheral edema predominantly around the ankles. Head is  atraumatic. Sclera and conjunctiva are normal. There is no jugular venous distention. Lungs are clear. Respiratory effort is not labored. Cardiac exam reveals S1 and S2. 5/6 systolic murmur at the RUSB.  Abdomen is soft. She does have mild peripheral edema. There are no musculoskeletal deformities. There are no skin rashes. Neurologic is grossly intact.  EKG:   EKG is done today and reviewed by me. The rhythm is paced, intermittent atrial.  Recent Labs: 05/13/2017: ALT 22; BUN 17; Creatinine, Ser 0.97; Hemoglobin 14.2; Platelets 176; Potassium 4.2; Sodium 145; TSH 1.290   Lipid Panel    Component Value Date/Time   CHOL 193 05/13/2017 1224   TRIG 152 (H) 05/13/2017 1224   HDL 41 05/13/2017 1224   CHOLHDL 4.7 (H) 05/13/2017 1224   CHOLHDL 3.9 11/21/2011 1322  VLDL 25 11/21/2011 1322   LDLCALC 122 (H) 05/13/2017 1224   Wt Readings from Last 3 Encounters:  12/23/17 203 lb (92.1 kg)  11/13/17 201 lb (91.2 kg)  05/13/17 207 lb (93.9 kg)    Current medicines are reviewed  The patient understands her medications.  ECG: not done today   ASSESSMENT AND PLAN:  1. Tachy/bradycardia syndrome PM exchange in 2/17, followed by Dr Rayann Heman in May 2017, functioning well.  2. Afib Well controlled Continue flecainide, off cardizem, increased toprol XL to 50 mg po daily, her A. fib burden is less than 1%, no bleeding on Pradaxa.  3. HTN Controlled. Normal at home.  4. Acute on Chronic diastolic CHF - with LE edema, improved significantly with discontinuation of cardizem start. Unable to use Lasix as she previously had itching from it. I will start spironolactone 12.5 mg daily, discontinue potassium supplements.  Follow-up in 2 weeks with a pharmacist for blood pressure and lower extremity edema follow-up and checking her BMP to see if she needs to continue taking potassium supplements.  5. Systolic murmur - mild aortic stenosis on most recent echo in 04/2016.  Her murmur is worse now we will  repeat echocardiogram.  Follow up with a pharmacist in 2 weeks, with me in 4 months.  Ena Dawley 12/23/2017

## 2017-12-23 NOTE — Patient Instructions (Signed)
Medication Instructions:   STOP TAKING POTASSIUM CHLORIDE NOW  START TAKING SPIRONOLACTONE 12.5 MG ONCE DAILY--THIS WAS CALLED INTO YOUR LOCAL PHARMACY AT [facility]    Labwork:  2 WEEKS--SAME DAY AS YOU COME BACK TO SEE OUR [facility] (PHARMACIST)--TO CHECK--BMET    Testing/Procedures:  Your physician has requested that you have an echocardiogram. Echocardiography is a painless test that uses sound waves to create images of your heart. It provides your doctor with information about the size and shape of your heart and how well your heart's chambers and valves are working. This procedure takes approximately one hour. There are no restrictions for this procedure.    Follow-Up:  2 WEEKS IN OUR [facility] TO SEE THE PHARMACIST--YOU WILL HAVE LAB DONE SAME DAY   4 MONTHS WITH DR Meda Coffee       If you need a refill on your cardiac medications before your next appointment, please call your pharmacy.

## 2018-01-07 ENCOUNTER — Ambulatory Visit (INDEPENDENT_AMBULATORY_CARE_PROVIDER_SITE_OTHER): Payer: Medicare Other | Admitting: *Deleted

## 2018-01-07 DIAGNOSIS — I495 Sick sinus syndrome: Secondary | ICD-10-CM | POA: Diagnosis not present

## 2018-01-07 NOTE — Progress Notes (Signed)
Remote pacemaker transmission.   

## 2018-01-08 ENCOUNTER — Encounter: Payer: Self-pay | Admitting: Cardiology

## 2018-01-09 ENCOUNTER — Ambulatory Visit (INDEPENDENT_AMBULATORY_CARE_PROVIDER_SITE_OTHER): Payer: Medicare Other

## 2018-01-09 ENCOUNTER — Other Ambulatory Visit: Payer: Medicare Other | Admitting: *Deleted

## 2018-01-09 ENCOUNTER — Ambulatory Visit (HOSPITAL_COMMUNITY): Payer: Medicare Other | Attending: Cardiology

## 2018-01-09 ENCOUNTER — Other Ambulatory Visit: Payer: Self-pay

## 2018-01-09 VITALS — BP 136/62 | HR 60

## 2018-01-09 DIAGNOSIS — E876 Hypokalemia: Secondary | ICD-10-CM | POA: Diagnosis not present

## 2018-01-09 DIAGNOSIS — I4819 Other persistent atrial fibrillation: Secondary | ICD-10-CM

## 2018-01-09 DIAGNOSIS — Z950 Presence of cardiac pacemaker: Secondary | ICD-10-CM

## 2018-01-09 DIAGNOSIS — E039 Hypothyroidism, unspecified: Secondary | ICD-10-CM | POA: Insufficient documentation

## 2018-01-09 DIAGNOSIS — I350 Nonrheumatic aortic (valve) stenosis: Secondary | ICD-10-CM | POA: Insufficient documentation

## 2018-01-09 DIAGNOSIS — I10 Essential (primary) hypertension: Secondary | ICD-10-CM | POA: Diagnosis not present

## 2018-01-09 DIAGNOSIS — I480 Paroxysmal atrial fibrillation: Secondary | ICD-10-CM | POA: Diagnosis not present

## 2018-01-09 DIAGNOSIS — I495 Sick sinus syndrome: Secondary | ICD-10-CM | POA: Insufficient documentation

## 2018-01-09 DIAGNOSIS — I5033 Acute on chronic diastolic (congestive) heart failure: Secondary | ICD-10-CM | POA: Diagnosis not present

## 2018-01-09 DIAGNOSIS — I481 Persistent atrial fibrillation: Secondary | ICD-10-CM | POA: Diagnosis not present

## 2018-01-09 LAB — ECHOCARDIOGRAM COMPLETE

## 2018-01-09 NOTE — Progress Notes (Signed)
Patient ID: [name]                 DOB: 12-Aug-1927                      MRN: [id number]     HPI: [name] is an 84 y.o. female referred by Dr. Meda Coffee to HTN clinic.PMH is significant for a fib, tachy/bradycardia syndrome s/p pace maker exchange 0/1027, HTN, systolic murmer, aortic stenosis, HFpEF with EF 60-65% in 2017.  Patient was recently started on spironolactone 2 weeks ago to help with blood pressure management and LE edema. Patient is allergic to furosemide (itching). Patient presents to clinic to monitor her blood pressure control and her BMP since starting spironolactone.   [name] presents in good spirits today, accompanied by her daughter. She reports tolerating spironolactone. She denies dizziness, falls, and balance issues; patient does endorse falling in the hallway of her independent living community but unsure of timing and believes it was before spironolactone added. Patient walks with cane or walker. Patient does endorse edema in her lower extremities. Mild edema was noted on presentation, more so in her left ankle than right ankle. Patient reports edema worsening throughout the day. Patient was counseled on ways to reduce edema including leg elevation, compression socks, and low sodium diet. Patient reports her spironolactone does help some with her edema.   Current HTN/CHF meds:HCTZ 25mg  BID, Metoprolol succinate 50mg  daily, spironlactone 12.5mg  daily Previously tried: allergic to furosemide (itching), allergic to lisinopril (cough), intolerance to diltiazem (caused edema and skin discoloration)  BP goal: <140/90   Family History: heart disease in father and son  Social History: former smoker, quit 70yr ago, denies alcohol and illicit drug use.   Diet: [employer] home dining room - cafeteria line; potato soup when cooks at home, daughter brings new dish, like saltines. Does not add salt to food.   Wt Readings from Last 3 Encounters:  12/23/17 203 lb (92.1 kg)    11/13/17 201 lb (91.2 kg)  05/13/17 207 lb (93.9 kg)   BP Readings from Last 3 Encounters:  12/23/17 138/70  11/13/17 124/82  05/13/17 140/72   Pulse Readings from Last 3 Encounters:  12/23/17 65  11/13/17 62  05/13/17 66    Renal function: CrCl cannot be calculated (Patient's most recent lab result is older than the maximum 21 days allowed.).  Past Medical History:  Diagnosis Date  . Brady-tachy syndrome ([place])    a. Biotronik dual chamber PPM implanted 2009 b. gen change to STJ dual chamber PPM 2017  . Hair loss    Patient questioned Coumadin, changed toPradaxa  . Hypertension   . Hypothyroidism   . Persistent atrial fibrillation ([place])     Current Outpatient Medications on File Prior to Visit  Medication Sig Dispense Refill  . Calcium Carbonate-Vit D-Min (CALCIUM 600+D PLUS MINERALS) 600-400 MG-UNIT TABS Take 1 tablet by mouth daily.     . Cholecalciferol (VITAMIN D) 400 UNITS capsule Take 400 Units by mouth daily.     Marland Kitchen CRANBERRY PO Take 2 capsules by mouth daily.     . CRESTOR 5 MG tablet Take 1 tablet (5 mg total) by mouth 3 (three) times a week. Take on Tues, Thurs, and Saturdays 45 tablet 1  . dabigatran (PRADAXA) 150 MG CAPS capsule Take 1 capsule (150 mg total) by mouth every 12 (twelve) hours. 180 capsule 3  . diclofenac sodium (VOLTAREN) 1 % GEL Apply 1 application topically  daily as needed (pain).     . flecainide (TAMBOCOR) 50 MG tablet Take 1 tablet (50 mg total) by mouth 2 (two) times daily. 180 tablet 3  . fluticasone (FLONASE) 50 MCG/ACT nasal spray Place 2 sprays into both nostrils daily as needed for allergies or rhinitis.    . hydrochlorothiazide (HYDRODIURIL) 25 MG tablet Take 1 tablet (25 mg total) by mouth 2 (two) times daily. 180 tablet 2  . levothyroxine (SYNTHROID, LEVOTHROID) 137 MCG tablet Take 137 mcg by mouth daily.  0  . metoprolol succinate (TOPROL-XL) 50 MG 24 hr tablet Take 1 tablet (50 mg total) by mouth daily. Take with or immediately  following a meal. 90 tablet 2  . Multiple Vitamins-Minerals (OCUVITE PO) Take 1 tablet by mouth daily.     . Probiotic Product (PROBIOTIC PO) Take 1 tablet by mouth daily.    Marland Kitchen spironolactone (ALDACTONE) 25 MG tablet Take 0.5 tablets (12.5 mg total) by mouth daily. 30 tablet 0   No current facility-administered medications on file prior to visit.      Assessment/Plan:  1. Hypertension - BP at goal today <140/93mmHg with reading of 138/62 and 136/64. Patient's labs are pending - will increase spironolactone to 25mg  daily if electrolytes are within normal range to help improve LEE. Will call [name] tomorrow with results of lab. Patient will try compression socks, elevating her legs more often, and continuing to limit her sodium intake to help reduce LEE as well.     Isaias Sakai, Sherian Rein D PGY1 Pharmacy Resident  Phone [phone] 01/09/2018      2:16 PM

## 2018-01-09 NOTE — Patient Instructions (Signed)
Blood pressure goal is less than 140/90 - you were at goal today with blood pressures of 138/62 and 136/64.   We will increase your spironolactone to 25mg  daily if your lab work from today comes back with your potassium in range. We will call you tomorrow (8/2) to let you know what your labs were.  We will need to recheck your blood work in 2-4 weeks to make sure your labs are still ok.   If you have any dizziness or light headedness please give [place] a call. [phone]

## 2018-01-10 ENCOUNTER — Telehealth: Payer: Self-pay

## 2018-01-10 LAB — BASIC METABOLIC PANEL
BUN/Creatinine Ratio: 25 (ref 12–28)
BUN: 22 mg/dL (ref 8–27)
CO2: 26 mmol/L (ref 20–29)
Calcium: 10.1 mg/dL (ref 8.7–10.3)
Chloride: 98 mmol/L (ref 96–106)
Creatinine, Ser: 0.89 mg/dL (ref 0.57–1.00)
GFR calc Af Amer: 67 mL/min/{1.73_m2} (ref >59)
GFR calc non Af Amer: 58 mL/min/{1.73_m2} — ABNORMAL LOW (ref >59)
Glucose: 107 mg/dL — ABNORMAL HIGH (ref 65–99)
Potassium: 3.7 mmol/L (ref 3.5–5.2)
Sodium: 143 mmol/L (ref 134–144)

## 2018-01-10 MED ORDER — SPIRONOLACTONE 25 MG PO TABS: 25.0000 mg | ORAL_TABLET | Freq: Every day | ORAL | 3 refills | Status: DC

## 2018-01-10 NOTE — Telephone Encounter (Signed)
-----   Message from Sueanne Margarita, MD sent at 01/10/2018  9:46 AM EDT ----- Stable labs - continue current meds and forward to PCP

## 2018-01-10 NOTE — Telephone Encounter (Signed)
Contacted [name] with results of her labs. Her potassium was wnl at 3.7. She will increase her spironolactone to 25mg  daily to help with edema. Patient was given pharmacist office number for any questions or concerns.  Isaias Sakai, Sherian Rein D PGY1 Pharmacy Resident  Phone [phone] 01/10/2018      1:45 PM

## 2018-01-10 NOTE — Telephone Encounter (Signed)
Notes recorded by Frederik Schmidt, RN on 01/10/2018 at 10:09 AM EDT Informed patient of lab results. She verbalized understanding and was wondering if she should increase her Spironolactone to 25 mg daily. Please advise, thank you. ------

## 2018-01-15 ENCOUNTER — Telehealth: Payer: Self-pay | Admitting: *Deleted

## 2018-01-15 DIAGNOSIS — I350 Nonrheumatic aortic (valve) stenosis: Secondary | ICD-10-CM

## 2018-01-15 DIAGNOSIS — I480 Paroxysmal atrial fibrillation: Secondary | ICD-10-CM

## 2018-01-15 NOTE — Telephone Encounter (Signed)
-----   Message from Sueanne Margarita, MD sent at 01/10/2018  4:43 PM EDT ----- Please let patient know that echo showed normal LVF with mildly thickened heart muscle and increased stiffness of heart muscle normal for age, moderate stenosis of the AV - compared to prior echo AS has progressed to moderate - repeat echo in 6 months for stability

## 2018-01-16 ENCOUNTER — Telehealth: Payer: Self-pay | Admitting: Cardiology

## 2018-01-16 NOTE — Telephone Encounter (Signed)
Pt's daughter Pat Kocher called to clarify to her pt's echo results. Results given. Daughter is aware  that Dr.'s Turner recommends to repeat echo in 6 months to review stability. Daughter verbalized understanding.

## 2018-01-16 NOTE — Telephone Encounter (Signed)
New message:      Pt's daughter is calling and states she would like to speak with a RN about the results of labs/test that the pt was called about yesterday.

## 2018-01-21 NOTE — Telephone Encounter (Signed)
Sent [facility] a message to call the pt back to arrange echo for 6 months out.

## 2018-01-22 ENCOUNTER — Telehealth: Payer: Self-pay | Admitting: Cardiology

## 2018-01-22 DIAGNOSIS — R600 Localized edema: Secondary | ICD-10-CM

## 2018-01-22 HISTORY — DX: Localized edema: R60.0

## 2018-01-22 MED ORDER — METOLAZONE 2.5 MG PO TABS: 2.5000 mg | ORAL_TABLET | ORAL | 3 refills | Status: DC

## 2018-01-22 NOTE — Telephone Encounter (Signed)
Pt calling in to inform Dr Meda Coffee that even with the new start of spironolactone 25 mg po daily, and taking HCTZ 25 mg po bid, her lower extremity swelling is unchanged.  Pt states she cannot take lasix due to causing her to itch, and marked as an allergy.  Pt states she lives in a retirement community where her diet is prepared for her, so unknown how much sodium she is actually consuming.  Pt states she is going to [place] with her daughters in the next couple of days, to the compression stocking outlet store.   Pt states she is elevating her lower extremities in a recliner, while at rest.  Pt does not weigh herself daily. Pt has no complaints of SOB, DOE, or CP.  Pt has no other cardiac complaints.  Pt would like for Dr Meda Coffee to advise on a change in med regimen, or further recommendations to help with her swelling.  Informed the pt that I will route this message to Dr Meda Coffee to review and advise on, and follow-up with the pt shortly thereafter.  Advised the pt to continue her regimen as is, elevate her extremities while at rest, proceed with getting her compression stockings, and maintain a low sodium diet.  Pt verbalized understanding and agrees with this plan.

## 2018-01-22 NOTE — Telephone Encounter (Signed)
Please d/c HCTZ and try metolazone 2.5 mg po every other day. BMP to be done in 2 weeks if that works for her

## 2018-01-22 NOTE — Telephone Encounter (Signed)
Cam Hai, LPN        Rianna Lukes   I left a vm for the patient, I will call again in a few days.   Romie Minus

## 2018-01-22 NOTE — Telephone Encounter (Signed)
New Message   Pt c/o swelling: STAT is pt has developed SOB within 24 hours  1) How much weight have you gained and in what time span? unknown  If swelling, where is the swelling located? Feet and legs 2) Are you currently taking a fluid pill? yes  3) Are you currently SOB? no  4) Do you have a log of your daily weights (if so, list)?no  5) Have you gained 3 pounds in a day or 5 pounds in a week? no  6) Have you traveled recently? no    Patient states that her feet and legs are still swelling. Please call.

## 2018-01-22 NOTE — Telephone Encounter (Signed)
Pts echo is scheduled for 07/14/2018 at 1030.  Pt made aware of appt date and time by [facility] scheduling.

## 2018-01-22 NOTE — Telephone Encounter (Signed)
Notified the pt that per Dr Meda Coffee, she can stop her HCTZ and start taking metolazone 2.5 mg po every other day, and come in for repeat BMET in 2 weeks. Pt states she will start this new medication on this Friday, and will come in for lab in 2 weeks, on 02/07/18.  Confirmed the pharmacy of choice with the pt. Pt verbalized understanding and agrees with this plan.

## 2018-02-07 ENCOUNTER — Other Ambulatory Visit: Payer: Medicare Other | Admitting: *Deleted

## 2018-02-07 DIAGNOSIS — R600 Localized edema: Secondary | ICD-10-CM

## 2018-02-07 LAB — BASIC METABOLIC PANEL
BUN/Creatinine Ratio: 18 (ref 12–28)
BUN: 15 mg/dL (ref 8–27)
CO2: 27 mmol/L (ref 20–29)
Calcium: 9.8 mg/dL (ref 8.7–10.3)
Chloride: 99 mmol/L (ref 96–106)
Creatinine, Ser: 0.85 mg/dL (ref 0.57–1.00)
GFR calc Af Amer: 71 mL/min/{1.73_m2} (ref >59)
GFR calc non Af Amer: 61 mL/min/{1.73_m2} (ref >59)
Glucose: 89 mg/dL (ref 65–99)
Potassium: 3.7 mmol/L (ref 3.5–5.2)
Sodium: 141 mmol/L (ref 134–144)

## 2018-02-08 LAB — CUP PACEART REMOTE DEVICE CHECK
Battery Remaining Longevity: 126 mo
Battery Remaining Percentage: 95.5 %
Brady Statistic AP VP Percent: 4.9 %
Brady Statistic AP VS Percent: 93 %
Brady Statistic AS VP Percent: 1 %
Brady Statistic AS VS Percent: 2.5 %
Brady Statistic RA Percent Paced: 97 %
Brady Statistic RV Percent Paced: 4.9 %
Date Time Interrogation Session: 20190730060013
Implantable Lead Implant Date: 20091109
Implantable Lead Model: 350
Implantable Lead Serial Number: 24891460
Implantable Lead Serial Number: 28411861
Lead Channel Impedance Value: 1575 Ohm
Lead Channel Impedance Value: 480 Ohm
Lead Channel Pacing Threshold Amplitude: 1 V
Lead Channel Pacing Threshold Pulse Width: 0.4 ms
Lead Channel Sensing Intrinsic Amplitude: 0.8 mV
Lead Channel Sensing Intrinsic Amplitude: 12 mV
Lead Channel Setting Pacing Amplitude: 2 V
Lead Channel Setting Pacing Pulse Width: 0.4 ms
Lead Channel Setting Sensing Sensitivity: 2 mV
MDC IDC LEAD IMPLANT DT: 20091109
MDC IDC LEAD LOCATION: 753859
MDC IDC LEAD LOCATION: 753860
MDC IDC MSMT BATTERY VOLTAGE: 2.99 V
MDC IDC MSMT LEADCHNL RA PACING THRESHOLD AMPLITUDE: 1 V
MDC IDC MSMT LEADCHNL RV PACING THRESHOLD PULSEWIDTH: 0.4 ms
MDC IDC PG IMPLANT DT: 20170227
MDC IDC SET LEADCHNL RV PACING AMPLITUDE: 2.5 V
Pulse Gen Model: 2240
Pulse Gen Serial Number: 7885781

## 2018-02-11 ENCOUNTER — Telehealth: Payer: Self-pay | Admitting: Cardiology

## 2018-02-11 DIAGNOSIS — R1310 Dysphagia, unspecified: Secondary | ICD-10-CM | POA: Diagnosis not present

## 2018-02-11 DIAGNOSIS — I4819 Other persistent atrial fibrillation: Secondary | ICD-10-CM

## 2018-02-11 DIAGNOSIS — I480 Paroxysmal atrial fibrillation: Secondary | ICD-10-CM | POA: Diagnosis not present

## 2018-02-11 DIAGNOSIS — I495 Sick sinus syndrome: Secondary | ICD-10-CM | POA: Diagnosis not present

## 2018-02-11 DIAGNOSIS — K219 Gastro-esophageal reflux disease without esophagitis: Secondary | ICD-10-CM | POA: Diagnosis not present

## 2018-02-11 MED ORDER — METOPROLOL SUCCINATE ER 50 MG PO TB24: 50.0000 mg | ORAL_TABLET | Freq: Every day | ORAL | 3 refills | Status: DC

## 2018-02-11 NOTE — Telephone Encounter (Signed)
New Message    *STAT* If patient is at the pharmacy, call can be transferred to refill team.   1. Which medications need to be refilled? (please list name of each medication and dose if known) metoprolol succinate (TOPROL-XL) 50 MG 24 hr tablet  2. Which pharmacy/location (including street and city if local pharmacy) is medication to be sent to? [facility] - [place], [place] - [address]  3. Do they need a 30 day or 90 day supply? [place]

## 2018-02-11 NOTE — Telephone Encounter (Signed)
Pt's medication was sent to pt's pharmacy as requested. Confirmation received.  °

## 2018-02-17 ENCOUNTER — Other Ambulatory Visit: Payer: Self-pay | Admitting: Gastroenterology

## 2018-02-17 DIAGNOSIS — R1310 Dysphagia, unspecified: Secondary | ICD-10-CM

## 2018-02-17 DIAGNOSIS — R1319 Other dysphagia: Secondary | ICD-10-CM

## 2018-02-21 ENCOUNTER — Ambulatory Visit
Admission: RE | Admit: 2018-02-21 | Discharge: 2018-02-21 | Disposition: A | Discharge status: Home / Self Care | Payer: Medicare Other | Source: Ambulatory Visit | Attending: Gastroenterology | Admitting: Gastroenterology

## 2018-02-21 DIAGNOSIS — R1310 Dysphagia, unspecified: Secondary | ICD-10-CM | POA: Diagnosis not present

## 2018-02-21 DIAGNOSIS — R1319 Other dysphagia: Secondary | ICD-10-CM

## 2018-03-17 DIAGNOSIS — I480 Paroxysmal atrial fibrillation: Secondary | ICD-10-CM | POA: Diagnosis not present

## 2018-03-17 DIAGNOSIS — E039 Hypothyroidism, unspecified: Secondary | ICD-10-CM | POA: Diagnosis not present

## 2018-03-17 DIAGNOSIS — Z23 Encounter for immunization: Secondary | ICD-10-CM | POA: Diagnosis not present

## 2018-03-17 DIAGNOSIS — I10 Essential (primary) hypertension: Secondary | ICD-10-CM | POA: Diagnosis not present

## 2018-04-08 ENCOUNTER — Ambulatory Visit (INDEPENDENT_AMBULATORY_CARE_PROVIDER_SITE_OTHER): Payer: Medicare Other | Admitting: *Deleted

## 2018-04-08 DIAGNOSIS — I495 Sick sinus syndrome: Secondary | ICD-10-CM

## 2018-04-08 NOTE — Progress Notes (Signed)
Remote pacemaker transmission.   

## 2018-04-17 ENCOUNTER — Other Ambulatory Visit: Payer: Self-pay | Admitting: Cardiology

## 2018-04-17 DIAGNOSIS — R600 Localized edema: Secondary | ICD-10-CM

## 2018-05-01 ENCOUNTER — Encounter: Payer: Self-pay | Admitting: Cardiology

## 2018-05-01 ENCOUNTER — Ambulatory Visit (INDEPENDENT_AMBULATORY_CARE_PROVIDER_SITE_OTHER): Payer: Medicare Other | Admitting: Cardiology

## 2018-05-01 VITALS — BP 124/56 | HR 83 | Ht 63.0 in | Wt 204.0 lb

## 2018-05-01 DIAGNOSIS — R600 Localized edema: Secondary | ICD-10-CM

## 2018-05-01 DIAGNOSIS — E876 Hypokalemia: Secondary | ICD-10-CM

## 2018-05-01 DIAGNOSIS — Z950 Presence of cardiac pacemaker: Secondary | ICD-10-CM | POA: Diagnosis not present

## 2018-05-01 DIAGNOSIS — I4819 Other persistent atrial fibrillation: Secondary | ICD-10-CM | POA: Diagnosis not present

## 2018-05-01 DIAGNOSIS — I10 Essential (primary) hypertension: Secondary | ICD-10-CM | POA: Diagnosis not present

## 2018-05-01 DIAGNOSIS — I480 Paroxysmal atrial fibrillation: Secondary | ICD-10-CM | POA: Diagnosis not present

## 2018-05-01 DIAGNOSIS — I350 Nonrheumatic aortic (valve) stenosis: Secondary | ICD-10-CM

## 2018-05-01 DIAGNOSIS — E785 Hyperlipidemia, unspecified: Secondary | ICD-10-CM

## 2018-05-01 MED ORDER — METOPROLOL SUCCINATE ER 25 MG PO TB24: 75.0000 mg | ORAL_TABLET | Freq: Every day | ORAL | 2 refills | Status: DC

## 2018-05-01 MED ORDER — METOLAZONE 2.5 MG PO TABS: 2.5000 mg | ORAL_TABLET | ORAL | 2 refills | Status: DC

## 2018-05-01 MED ORDER — DABIGATRAN ETEXILATE MESYLATE 150 MG PO CAPS: 150.0000 mg | ORAL_CAPSULE | Freq: Two times a day (BID) | ORAL | 3 refills | Status: DC

## 2018-05-01 MED ORDER — FLECAINIDE ACETATE 50 MG PO TABS: 50.0000 mg | ORAL_TABLET | Freq: Two times a day (BID) | ORAL | 3 refills | Status: DC

## 2018-05-01 NOTE — Progress Notes (Signed)
Patient ID: [name], female   DOB: 1928/02/10, 84 y.o.   MRN: [id number]      Cardiology Office Note   Date:  05/01/2018   ID:  [name], DOB [birth year], MRN [id number] 12/23/2016 PCP:  Aretta Nip, MD  Cardiologist:  Ena Dawley, MD, previously Dr Ron [place], EP: Dr Rayann Heman  Chief complain: 6 months follow-up   History of Present Illness: [name] is a 84 y.o. female who presents today to follow-up atrial fibrillation and mild chronic diastolic CHF. She has known tachy-brady syndrome, s/p PM placement and exchanged planned for 08/02/2015 by Dr Rayann Heman. On long term flecainide. She has been started on Diltiazem in the past that caused her significant LE edema and skin discoloration and rash, it has subsided with decreased dose. She sometimes feels dizzy, but denies syncope. She has been feeling progressively more tired in the last 2 tears, slowly quitting volunteering activities.  Denies chest pain.  03/08/16 - 4 months follow up, feels well, has on and off LE edema, L>R, stable DOE, no chest pain, palpitations or syncope. Compliant with her meds. She feels better since receiving the new PM in 07/2015. No fever, well healed incision site, no drainage. No bleeding with Dabigatran.  December 23, 2017 -this is 6 months follow-up patient was seen by Dr. Rayann Heman in June and pacemaker interrogation showed less than 1% atrial fibrillation burden, she is tolerating flecainide very well.  She continues to have lower extremity edema despite 2 pills of hydrochlorothiazide a day, she is allergic to furosemide, and she denies any orthopnea or proximal nocturnal dyspnea.  She has no chest pain dizziness or syncope.  05/01/2018 -4 months follow-up, the patient denies any dizziness or syncope, she is stable shortness of breath, no chest pain.  Stable lower extremity edema controlled by compression stockings.  Past Medical History:  Diagnosis Date  . Brady-tachy syndrome ([place])    a. Biotronik dual  chamber PPM implanted 2009 b. gen change to STJ dual chamber PPM 2017  . Hair loss    Patient questioned Coumadin, changed toPradaxa  . Hypertension   . Hypothyroidism   . Persistent atrial fibrillation    Past Surgical History:  Procedure Laterality Date  . ABDOMINAL HYSTERECTOMY    . EP IMPLANTABLE DEVICE N/A 08/08/2015   Generator change with [employer] DR PPM by Dr Rayann Heman  . JOINT REPLACEMENT    . PACEMAKER PLACEMENT  2009       Patient Active Problem List   Diagnosis Date Noted  . Lower extremity edema 01/22/2018  . History of total knee replacement, right 10/25/2017  . Osteoarthritis of left knee 10/25/2017  . Persistent atrial fibrillation   . Hypertension   . Hair loss   . Aortic stenosis 03/08/2016  . Chronic anticoagulation 12/17/2014  . Mitral regurgitation 05/03/2014  . Edema leg 04/28/2014  . GERD (gastroesophageal reflux disease) 04/28/2014  . Fatigue 12/26/2011  . Ejection fraction   . Normal nuclear stress test   . Nausea   . Brady-tachy syndrome ([place])   . Hypothyroidism   . Paroxysmal atrial fibrillation ([facility])   . Drug therapy   . Drug therapy   . Pacemaker   . Cough   . ESSENTIAL HYPERTENSION, BENIGN 07/20/2010    Current Outpatient Medications  Medication Sig Dispense Refill  . Calcium Carbonate-Vit D-Min (CALCIUM 600+D PLUS MINERALS) 600-400 MG-UNIT TABS Take 1 tablet by mouth daily.     . Cholecalciferol (VITAMIN D) 400 UNITS capsule Take  400 Units by mouth daily.     Marland Kitchen CRANBERRY PO Take 2 capsules by mouth daily.     . dabigatran (PRADAXA) 150 MG CAPS capsule Take 1 capsule (150 mg total) by mouth every 12 (twelve) hours. 180 capsule 3  . diclofenac sodium (VOLTAREN) 1 % GEL Apply 1 application topically daily as needed (pain).     . flecainide (TAMBOCOR) 50 MG tablet Take 1 tablet (50 mg total) by mouth 2 (two) times daily. 180 tablet 3  . fluticasone (FLONASE) 50 MCG/ACT nasal spray Place 2 sprays into both nostrils daily as needed for  allergies or rhinitis.    Marland Kitchen metolazone (ZAROXOLYN) 2.5 MG tablet TAKE 1 TABLET (2.5 MG TOTAL) BY MOUTH EVERY OTHER DAY. 45 tablet 2  . Multiple Vitamins-Minerals (OCUVITE PO) Take 1 tablet by mouth daily.     . Probiotic Product (PROBIOTIC PO) Take 1 tablet by mouth daily.     No current facility-administered medications for this visit.    Allergies:   Morphine and related; Penicillins; Lasix [furosemide]; Clindamycin/lincomycin; Diltiazem; Erythromycin; Lisinopril; and Penicillins cross reactors   Social History:  The patient  reports that she quit smoking about 27 years ago. She has never used smokeless tobacco. She reports that she does not drink alcohol or use drugs.   Family History:  The patient's family history includes Coronary artery disease in her unknown relative; Heart disease in her father and son.   ROS:  Please see the history of present illness.     Patient denies fever, chills, headache, sweats, rash, change in vision, change in hearing, chest pain, cough, nausea or vomiting, urinary symptoms. All other systems are reviewed and are negative.  PHYSICAL EXAM: VS:  BP (!) 124/56   Pulse 83   Ht 5\' 3"  (1.6 m)   Wt 204 lb (92.5 kg)   SpO2 96%   BMI 36.14 kg/m  , Patient is stable. She is oriented to person time and place. Affect is normal. She is overweight. She has mild chronic peripheral edema predominantly around the ankles. Head is atraumatic. Sclera and conjunctiva are normal. There is no jugular venous distention. Lungs are clear. Respiratory effort is not labored. Cardiac exam reveals S1 and S2. 5/6 systolic murmur at the RUSB.  Abdomen is soft. She does have mild peripheral edema. There are no musculoskeletal deformities. There are no skin rashes. Neurologic is grossly intact.  EKG:   EKG is done today and reviewed by me. The rhythm is paced, intermittent atrial.  Recent Labs: 05/13/2017: ALT 22; Hemoglobin 14.2; Platelets 176; TSH 1.290 02/07/2018: BUN 15; Creatinine,  Ser 0.85; Potassium 3.7; Sodium 141   Lipid Panel    Component Value Date/Time   CHOL 193 05/13/2017 1224   TRIG 152 (H) 05/13/2017 1224   HDL 41 05/13/2017 1224   CHOLHDL 4.7 (H) 05/13/2017 1224   CHOLHDL 3.9 11/21/2011 1322   VLDL 25 11/21/2011 1322   LDLCALC 122 (H) 05/13/2017 1224   Wt Readings from Last 3 Encounters:  05/01/18 204 lb (92.5 kg)  12/23/17 203 lb (92.1 kg)  11/13/17 201 lb (91.2 kg)    Current medicines are reviewed  The patient understands her medications.  ECG: not done today  TTE: 01/09/2018  - Left ventricle: The cavity size was normal. Wall thickness was   increased in a pattern of mild LVH. Systolic function was   vigorous. The estimated ejection fraction was in the range of 65%   to 70%. Wall motion was normal;  there were no regional wall   motion abnormalities. Doppler parameters are consistent with   abnormal left ventricular relaxation (grade 1 diastolic   dysfunction). The E/e&' ratio is >15, suggesting elevated LV   filling pressure. - Aortic valve: Calcified with moderate stenosis. Mean gradient   (S): 15 mm Hg. Peak gradient (S): 28 mm Hg. Valve area (VTI):   1.18 cm^2. Valve area (Vmax): 1.09 cm^2. Valve area (Vmean): 1.13   cm^2. - Mitral valve: Calcified annulus. Mildly thickened leaflets .   There was trivial regurgitation. - Left atrium: The atrium was normal in size. - Right ventricle: The cavity size was mildly dilated. Mildly   reduced systolic function. Pacer wire or catheter noted in right   ventricle. - Right atrium: The atrium was normal in size. Pacer wire or   catheter noted in right atrium. - Tricuspid valve: There was mild regurgitation. - Pulmonary arteries: PA peak pressure: 28 mm Hg (S) + RAP. - Systemic veins: Not visualized.  Impressions:  - Compared to a prior study in 2017, the LVEF is higher at 65-70%.   There is now moderate aortic stenosis.   ASSESSMENT AND PLAN:  1.  Paroxysmal atrial fibrillation, in  sinus rhythm today with elevated heart rates, I will increase Toprol-XL to 75 mg daily.  No bleeding on Pradaxa, continue flecainide.  2.  Moderate aortic stenosis -Based on the echo in August 2019, S2 is still present, I have repeat scheduled for February 2020 followed by visit with me, she is asymptomatic.  3. HTN Controlled. Normal at home.  4. Acute on Chronic diastolic CHF - with LE edema, improved significantly with discontinuation of cardizem.  She is allergic to Lasix, refused to take spironolactone as she is peeing every 15 minutes.  Lower extremity edema is controlled by compression socks and metolazone 2.5 mg every other day.  Follow up in 3 months.  Ena Dawley 05/01/2018

## 2018-05-01 NOTE — Patient Instructions (Signed)
Medication Instructions:   INCREASE YOUR METOPROLOL SUCCINATE TO 75 MG BY MOUTH DAILY  If you need a refill on your cardiac medications before your next appointment, please call your pharmacy.      Follow-Up:  3 MONTHS WITH DR Meda Coffee

## 2018-05-13 DIAGNOSIS — H52223 Regular astigmatism, bilateral: Secondary | ICD-10-CM | POA: Diagnosis not present

## 2018-05-13 DIAGNOSIS — T1510XA Foreign body in conjunctival sac, unspecified eye, initial encounter: Secondary | ICD-10-CM | POA: Diagnosis not present

## 2018-05-13 DIAGNOSIS — H3562 Retinal hemorrhage, left eye: Secondary | ICD-10-CM | POA: Diagnosis not present

## 2018-05-13 DIAGNOSIS — H04123 Dry eye syndrome of bilateral lacrimal glands: Secondary | ICD-10-CM | POA: Diagnosis not present

## 2018-05-13 DIAGNOSIS — H5203 Hypermetropia, bilateral: Secondary | ICD-10-CM | POA: Diagnosis not present

## 2018-05-13 DIAGNOSIS — H524 Presbyopia: Secondary | ICD-10-CM | POA: Diagnosis not present

## 2018-06-10 LAB — CUP PACEART REMOTE DEVICE CHECK
Brady Statistic AS VP Percent: 1 %
Brady Statistic RA Percent Paced: 95 %
Brady Statistic RV Percent Paced: 3.3 %
Date Time Interrogation Session: 20191029060015
Implantable Lead Implant Date: 20091109
Implantable Lead Location: 753859
Implantable Lead Location: 753860
Implantable Lead Model: 350
Implantable Lead Serial Number: 24891460
Lead Channel Pacing Threshold Pulse Width: 0.4 ms
Lead Channel Pacing Threshold Pulse Width: 0.4 ms
Lead Channel Sensing Intrinsic Amplitude: 12 mV
Lead Channel Setting Pacing Amplitude: 2 V
Lead Channel Setting Pacing Amplitude: 2.5 V
Lead Channel Setting Sensing Sensitivity: 2 mV
MDC IDC LEAD IMPLANT DT: 20091109
MDC IDC LEAD SERIAL: 28411861
MDC IDC MSMT BATTERY REMAINING LONGEVITY: 125 mo
MDC IDC MSMT BATTERY REMAINING PERCENTAGE: 95.5 %
MDC IDC MSMT BATTERY VOLTAGE: 2.99 V
MDC IDC MSMT LEADCHNL RA IMPEDANCE VALUE: 460 Ohm
MDC IDC MSMT LEADCHNL RA PACING THRESHOLD AMPLITUDE: 1 V
MDC IDC MSMT LEADCHNL RA SENSING INTR AMPL: 2 mV
MDC IDC MSMT LEADCHNL RV IMPEDANCE VALUE: 1600 Ohm
MDC IDC MSMT LEADCHNL RV PACING THRESHOLD AMPLITUDE: 1 V
MDC IDC PG IMPLANT DT: 20170227
MDC IDC SET LEADCHNL RV PACING PULSEWIDTH: 0.4 ms
MDC IDC STAT BRADY AP VP PERCENT: 2.8 %
MDC IDC STAT BRADY AP VS PERCENT: 94 %
MDC IDC STAT BRADY AS VS PERCENT: 3.3 %
Pulse Gen Model: 2240
Pulse Gen Serial Number: 7885781

## 2018-07-08 ENCOUNTER — Ambulatory Visit (INDEPENDENT_AMBULATORY_CARE_PROVIDER_SITE_OTHER): Payer: Medicare Other

## 2018-07-08 DIAGNOSIS — I495 Sick sinus syndrome: Secondary | ICD-10-CM | POA: Diagnosis not present

## 2018-07-09 NOTE — Progress Notes (Signed)
Remote pacemaker transmission.   

## 2018-07-11 LAB — CUP PACEART REMOTE DEVICE CHECK
Battery Remaining Longevity: 128 mo
Battery Voltage: 2.99 V
Brady Statistic AP VS Percent: 95 %
Brady Statistic AS VP Percent: 1 %
Brady Statistic AS VS Percent: 2.5 %
Date Time Interrogation Session: 20200128091344
Implantable Lead Implant Date: 20091109
Implantable Lead Location: 753859
Implantable Lead Location: 753860
Implantable Lead Model: 350
Implantable Lead Serial Number: 24891460
Lead Channel Impedance Value: 550 Ohm
Lead Channel Pacing Threshold Amplitude: 1 V
Lead Channel Pacing Threshold Amplitude: 1 V
Lead Channel Pacing Threshold Pulse Width: 0.4 ms
Lead Channel Pacing Threshold Pulse Width: 0.4 ms
Lead Channel Sensing Intrinsic Amplitude: 12 mV
Lead Channel Setting Pacing Amplitude: 2 V
Lead Channel Setting Pacing Amplitude: 2.5 V
MDC IDC LEAD IMPLANT DT: 20091109
MDC IDC LEAD SERIAL: 28411861
MDC IDC MSMT BATTERY REMAINING PERCENTAGE: 95.5 %
MDC IDC MSMT LEADCHNL RA SENSING INTR AMPL: 1.9 mV
MDC IDC MSMT LEADCHNL RV IMPEDANCE VALUE: 1600 Ohm
MDC IDC PG IMPLANT DT: 20170227
MDC IDC SET LEADCHNL RV PACING PULSEWIDTH: 0.4 ms
MDC IDC SET LEADCHNL RV SENSING SENSITIVITY: 2 mV
MDC IDC STAT BRADY AP VP PERCENT: 2.5 %
MDC IDC STAT BRADY RA PERCENT PACED: 96 %
MDC IDC STAT BRADY RV PERCENT PACED: 3.2 %
Pulse Gen Model: 2240
Pulse Gen Serial Number: 7885781

## 2018-07-14 ENCOUNTER — Ambulatory Visit (HOSPITAL_COMMUNITY): Payer: Medicare Other | Attending: Cardiology

## 2018-07-14 DIAGNOSIS — I350 Nonrheumatic aortic (valve) stenosis: Secondary | ICD-10-CM | POA: Diagnosis not present

## 2018-07-14 MED ORDER — PERFLUTREN LIPID MICROSPHERE
1.0000 mL | INTRAVENOUS | Status: AC | PRN
  Administered 2018-07-14: 2 mL via INTRAVENOUS

## 2018-07-28 ENCOUNTER — Encounter: Payer: Self-pay | Admitting: Cardiology

## 2018-08-25 ENCOUNTER — Ambulatory Visit: Payer: Medicare Other | Admitting: Cardiology

## 2018-09-23 DIAGNOSIS — E039 Hypothyroidism, unspecified: Secondary | ICD-10-CM | POA: Diagnosis not present

## 2018-09-23 DIAGNOSIS — I350 Nonrheumatic aortic (valve) stenosis: Secondary | ICD-10-CM | POA: Diagnosis not present

## 2018-09-23 DIAGNOSIS — I480 Paroxysmal atrial fibrillation: Secondary | ICD-10-CM | POA: Diagnosis not present

## 2018-09-23 DIAGNOSIS — Z950 Presence of cardiac pacemaker: Secondary | ICD-10-CM | POA: Diagnosis not present

## 2018-09-23 DIAGNOSIS — I10 Essential (primary) hypertension: Secondary | ICD-10-CM | POA: Diagnosis not present

## 2018-09-25 DIAGNOSIS — I10 Essential (primary) hypertension: Secondary | ICD-10-CM | POA: Diagnosis not present

## 2018-09-25 DIAGNOSIS — E039 Hypothyroidism, unspecified: Secondary | ICD-10-CM | POA: Diagnosis not present

## 2018-10-07 ENCOUNTER — Other Ambulatory Visit: Payer: Self-pay

## 2018-10-07 ENCOUNTER — Ambulatory Visit (INDEPENDENT_AMBULATORY_CARE_PROVIDER_SITE_OTHER): Payer: Medicare Other | Admitting: *Deleted

## 2018-10-07 DIAGNOSIS — I495 Sick sinus syndrome: Secondary | ICD-10-CM

## 2018-10-07 LAB — CUP PACEART REMOTE DEVICE CHECK
Battery Remaining Longevity: 126 mo
Battery Remaining Percentage: 95.5 %
Battery Voltage: 2.99 V
Brady Statistic AP VP Percent: 2.3 %
Brady Statistic AP VS Percent: 95 %
Brady Statistic AS VP Percent: 1 %
Brady Statistic AS VS Percent: 2 %
Brady Statistic RA Percent Paced: 96 %
Brady Statistic RV Percent Paced: 2.9 %
Date Time Interrogation Session: 20200428060016
Implantable Lead Implant Date: 20091109
Implantable Lead Implant Date: 20091109
Implantable Lead Location: 753859
Implantable Lead Location: 753860
Implantable Lead Model: 350
Implantable Lead Serial Number: 24891460
Implantable Lead Serial Number: 28411861
Implantable Pulse Generator Implant Date: 20170227
Lead Channel Impedance Value: 1625 Ohm
Lead Channel Impedance Value: 490 Ohm
Lead Channel Pacing Threshold Amplitude: 1 V
Lead Channel Pacing Threshold Amplitude: 1 V
Lead Channel Pacing Threshold Pulse Width: 0.4 ms
Lead Channel Pacing Threshold Pulse Width: 0.4 ms
Lead Channel Sensing Intrinsic Amplitude: 0.7 mV
Lead Channel Sensing Intrinsic Amplitude: 12 mV
Lead Channel Setting Pacing Amplitude: 2 V
Lead Channel Setting Pacing Amplitude: 2.5 V
Lead Channel Setting Pacing Pulse Width: 0.4 ms
Lead Channel Setting Sensing Sensitivity: 2 mV
Pulse Gen Model: 2240
Pulse Gen Serial Number: 7885781

## 2018-10-16 NOTE — Progress Notes (Signed)
Remote pacemaker transmission.   

## 2018-12-10 DIAGNOSIS — M25511 Pain in right shoulder: Secondary | ICD-10-CM | POA: Diagnosis not present

## 2019-01-02 DIAGNOSIS — M25511 Pain in right shoulder: Secondary | ICD-10-CM | POA: Diagnosis not present

## 2019-01-06 ENCOUNTER — Ambulatory Visit (INDEPENDENT_AMBULATORY_CARE_PROVIDER_SITE_OTHER): Payer: Medicare Other | Admitting: *Deleted

## 2019-01-06 DIAGNOSIS — I495 Sick sinus syndrome: Secondary | ICD-10-CM | POA: Diagnosis not present

## 2019-01-07 ENCOUNTER — Telehealth: Payer: Self-pay

## 2019-01-07 NOTE — Telephone Encounter (Signed)
Merlin sending her a part to her monitor. When she receive it she is calling us back to send the transmission.

## 2019-01-07 NOTE — Telephone Encounter (Signed)
Pt tried to send a manual transmission with her home monitor. We unplugged the monitor for 10 seconds and plugged it back in. I gave her the number to [facility] support to get additional help.

## 2019-01-07 NOTE — Telephone Encounter (Signed)
Left message for patient to remind of missed remote transmission.  

## 2019-01-13 ENCOUNTER — Other Ambulatory Visit: Payer: Self-pay | Admitting: Cardiology

## 2019-01-13 ENCOUNTER — Encounter: Payer: Self-pay | Admitting: Cardiology

## 2019-01-13 DIAGNOSIS — M25511 Pain in right shoulder: Secondary | ICD-10-CM | POA: Diagnosis not present

## 2019-01-13 DIAGNOSIS — R600 Localized edema: Secondary | ICD-10-CM

## 2019-01-13 LAB — CUP PACEART REMOTE DEVICE CHECK
Battery Remaining Longevity: 126 mo
Battery Remaining Percentage: 95.5 %
Battery Voltage: 2.99 V
Brady Statistic AP VP Percent: 2.5 %
Brady Statistic AP VS Percent: 96 %
Brady Statistic AS VP Percent: 1 %
Brady Statistic AS VS Percent: 1.6 %
Brady Statistic RA Percent Paced: 97 %
Brady Statistic RV Percent Paced: 3.1 %
Date Time Interrogation Session: 20200728060038
Implantable Lead Implant Date: 20091109
Implantable Lead Implant Date: 20091109
Implantable Lead Location: 753859
Implantable Lead Location: 753860
Implantable Lead Model: 350
Implantable Lead Serial Number: 24891460
Implantable Lead Serial Number: 28411861
Implantable Pulse Generator Implant Date: 20170227
Lead Channel Impedance Value: 1575 Ohm
Lead Channel Impedance Value: 490 Ohm
Lead Channel Pacing Threshold Amplitude: 1 V
Lead Channel Pacing Threshold Amplitude: 1 V
Lead Channel Pacing Threshold Pulse Width: 0.4 ms
Lead Channel Pacing Threshold Pulse Width: 0.4 ms
Lead Channel Sensing Intrinsic Amplitude: 1.2 mV
Lead Channel Sensing Intrinsic Amplitude: 12 mV
Lead Channel Setting Pacing Amplitude: 2 V
Lead Channel Setting Pacing Amplitude: 2.5 V
Lead Channel Setting Pacing Pulse Width: 0.4 ms
Lead Channel Setting Sensing Sensitivity: 2 mV
Pulse Gen Model: 2240
Pulse Gen Serial Number: 7885781

## 2019-01-13 NOTE — Telephone Encounter (Signed)
Patient called stating she wants these refills sent to [place], [place]

## 2019-01-13 NOTE — Telephone Encounter (Signed)
error 

## 2019-01-14 ENCOUNTER — Other Ambulatory Visit: Payer: Self-pay | Admitting: Cardiology

## 2019-01-14 DIAGNOSIS — R600 Localized edema: Secondary | ICD-10-CM

## 2019-01-14 MED ORDER — METOLAZONE 2.5 MG PO TABS: 2.5000 mg | ORAL_TABLET | ORAL | 0 refills | Status: DC

## 2019-01-14 NOTE — Telephone Encounter (Signed)
Pt waiting on mail order, sent short supply to local pharmacy

## 2019-01-14 NOTE — Telephone Encounter (Signed)
New Message      *STAT* If patient is at the pharmacy, call can be transferred to refill team.   1. Which medications need to be refilled? (please list name of each medication and dose if known) Metolazone 2.5  2. Which pharmacy/location (including street and city if local pharmacy) is medication to be sent to? [facility] on [address]   3. Do they need a 30 day or 90 day supply? 30   Pt hasn't received her medication and is almost out, they are wanting only a small supply sent to the pharmacy

## 2019-01-15 LAB — CUP PACEART REMOTE DEVICE CHECK
Battery Remaining Longevity: 125 mo
Battery Remaining Percentage: 95.5 %
Battery Voltage: 2.99 V
Brady Statistic AP VP Percent: 2.5 %
Brady Statistic AP VS Percent: 96 %
Brady Statistic AS VP Percent: 1 %
Brady Statistic AS VS Percent: 1.6 %
Brady Statistic RA Percent Paced: 97 %
Brady Statistic RV Percent Paced: 3.1 %
Date Time Interrogation Session: 20200806152742
Implantable Lead Implant Date: 20091109
Implantable Lead Implant Date: 20091109
Implantable Lead Location: 753859
Implantable Lead Location: 753860
Implantable Lead Model: 350
Implantable Lead Serial Number: 24891460
Implantable Lead Serial Number: 28411861
Implantable Pulse Generator Implant Date: 20170227
Lead Channel Impedance Value: 1525 Ohm
Lead Channel Impedance Value: 460 Ohm
Lead Channel Sensing Intrinsic Amplitude: 12 mV
Lead Channel Sensing Intrinsic Amplitude: 2 mV
Lead Channel Setting Pacing Amplitude: 2 V
Lead Channel Setting Pacing Amplitude: 2.5 V
Lead Channel Setting Pacing Pulse Width: 0.4 ms
Lead Channel Setting Sensing Sensitivity: 2 mV
Pulse Gen Model: 2240
Pulse Gen Serial Number: 7885781

## 2019-01-16 ENCOUNTER — Ambulatory Visit (INDEPENDENT_AMBULATORY_CARE_PROVIDER_SITE_OTHER): Payer: Medicare Other | Admitting: *Deleted

## 2019-01-16 DIAGNOSIS — I495 Sick sinus syndrome: Secondary | ICD-10-CM | POA: Diagnosis not present

## 2019-01-19 ENCOUNTER — Encounter: Payer: Self-pay | Admitting: Cardiology

## 2019-01-19 NOTE — Progress Notes (Signed)
Remote pacemaker transmission.   

## 2019-01-20 ENCOUNTER — Encounter: Payer: Self-pay | Admitting: Cardiology

## 2019-01-20 NOTE — Progress Notes (Signed)
Remote pacemaker transmission.   

## 2019-02-05 ENCOUNTER — Other Ambulatory Visit: Payer: Self-pay | Admitting: Cardiology

## 2019-02-05 DIAGNOSIS — R600 Localized edema: Secondary | ICD-10-CM

## 2019-02-09 NOTE — Progress Notes (Signed)
Cardiology Office Note    Date:  02/10/2019   ID:  [name], DOB April 21, 1928, MRN [id number]  PCP:  [name] Nip, MD  Cardiologist: [name] Dawley, MD EPS: None  Chief Complaint  Patient presents with  . Follow-up    History of Present Illness:  [name] is a 85 y.o. female with history of PAF on flecainide, pacemaker for brady-tachy syndrome,moderate AS, chronic diastolic CHF, chronic LE edema allergic to lasix on HCTZ.  Last saw Dr. Meda [name] 04/2018 and started on metolozone every other day for worsening LE edema. Echo 07/2018 moderate AS-calculated valve area 1.18 cm-mild with normal LVEF.  Patient comes in for regular f/u. Metolazone working well. Notices when she eats sweets her legs swell more. Denies palpitations, chest pain, dizziness or presyncope. Not having as many episodes of Afib as she used to. Lives at [facility].  Has been hard with lock down since covid19.  Past Medical History:  Diagnosis Date  . Brady-tachy syndrome ([facility])    a. Biotronik dual chamber PPM implanted 2009 b. gen change to STJ dual chamber PPM 2017  . Hair loss    Patient questioned Coumadin, changed toPradaxa  . Hypertension   . Hypothyroidism   . Persistent atrial fibrillation     Past Surgical History:  Procedure Laterality Date  . ABDOMINAL HYSTERECTOMY    . EP IMPLANTABLE DEVICE N/A 08/08/2015   Generator change with SJM Assurity DR PPM by Dr [name]  . JOINT REPLACEMENT    . PACEMAKER PLACEMENT  2009        Current Medications: Current Meds  Medication Sig  . Calcium Carbonate-Vit D-Min (CALCIUM 600+D PLUS MINERALS) 600-400 MG-UNIT TABS Take 1 tablet by mouth daily.   Marland Kitchen CRANBERRY PO Take 2 capsules by mouth daily.   . dabigatran (PRADAXA) 150 MG CAPS capsule Take 1 capsule (150 mg total) by mouth every 12 (twelve) hours.  . diclofenac sodium (VOLTAREN) 1 % GEL Apply 1 application topically daily as needed (pain).   . flecainide (TAMBOCOR) 50 MG  tablet Take 1 tablet (50 mg total) by mouth 2 (two) times daily.  . fluticasone (FLONASE) 50 MCG/ACT nasal spray Place 2 sprays into both nostrils daily as needed for allergies or rhinitis.  Marland Kitchen metolazone (ZAROXOLYN) 2.5 MG tablet TAKE 1 TABLET (2.5 MG TOTAL) BY MOUTH EVERY OTHER DAY.  . metoprolol succinate (TOPROL-XL) 25 MG 24 hr tablet TAKE THREE TABLETS BY MOUTH EVERY DAY  . Multiple Vitamins-Minerals (OCUVITE PO) Take 1 tablet by mouth daily.   . Probiotic Product (PROBIOTIC PO) Take 1 tablet by mouth daily.     Allergies:   Morphine and related, Penicillins, Lasix [furosemide], Clindamycin/lincomycin, Diltiazem, Erythromycin, Lisinopril, and Penicillins cross reactors   Social History   Socioeconomic History  . Marital status: Widowed    Spouse name: Not on file  . Number of children: Not on file  . Years of education: Not on file  . Highest education level: Not on file  Occupational History  . Not on file  Social Needs  . Financial resource strain: Not on file  . Food insecurity    Worry: Not on file    Inability: Not on file  . Transportation needs    Medical: Not on file    Non-medical: Not on file  Tobacco Use  . Smoking status: Former Smoker    Quit date: 09/06/1990    Years since quitting: 28.4  . Smokeless tobacco: Never Used  Substance  and Sexual Activity  . Alcohol use: No  . Drug use: No  . Sexual activity: Not on file  Lifestyle  . Physical activity    Days per week: Not on file    Minutes per session: Not on file  . Stress: Not on file  Relationships  . Social Herbalist on phone: Not on file    Gets together: Not on file    Attends religious service: Not on file    Active member of club or organization: Not on file    Attends meetings of clubs or organizations: Not on file    Relationship status: Not on file  Other Topics Concern  . Not on file  Social History Narrative  . Not on file     Family History:  The patient's family history  includes Coronary artery disease in an other family member; Heart disease in her father and son.   ROS:   Please see the history of present illness.    ROS All other systems reviewed and are negative.   PHYSICAL EXAM:   VS:  BP 136/70   Pulse 60   Ht 5' 3" (1.6 m)   Wt 202 lb 1.9 oz (91.7 kg)   BMI 35.80 kg/m   Physical Exam  GEN: Obese, in no acute distress  Neck: no JVD, carotid bruits, or masses Cardiac:RRR; no murmurs, rubs, or gallops  Respiratory:  clear to auscultation bilaterally, normal work of breathing GI: soft, nontender, nondistended, + BS Ext: +1 edema bilaterally, compression stockings in place otherwise without cyanosis, clubbing, Good distal pulses bilaterally Neuro:  Alert and Oriented x 3 Psych: euthymic mood, full affect  Wt Readings from Last 3 Encounters:  02/10/19 202 lb 1.9 oz (91.7 kg)  05/01/18 204 lb (92.5 kg)  12/23/17 203 lb (92.1 kg)      Studies/Labs Reviewed:   EKG:  EKG is  ordered today.  The ekg ordered today demonstrates Atrial paced at 60 beats per minute poor R wave progression question anteriorly T wave inversion anterior lateral similar to last EKG  Recent Labs: No results found for requested labs within last 8760 hours.   Lipid Panel    Component Value Date/Time   CHOL 193 05/13/2017 1224   TRIG 152 (H) 05/13/2017 1224   HDL 41 05/13/2017 1224   CHOLHDL 4.7 (H) 05/13/2017 1224   CHOLHDL 3.9 11/21/2011 1322   VLDL 25 11/21/2011 1322   LDLCALC 122 (H) 05/13/2017 1224    Additional studies/ records that were reviewed today include:  Echo 07/2018   1. The left ventricle has hyperdynamic systolic function of >32%. The cavity size is mildly increased. There is no left ventricular wall thickness. Echo evidence of pseudonormal diastolic filling patterns. Elevated left ventricular end-diastolic  pressure.  2. There is a dynamic mid LV chamber systolic gradient that is 43mHg at rest and increases to 138mg with Valsalva.  3. Mildly  dilated left atrial size.  4. Normal right atrial size.  5. The mitral valve normal in structure. There is moderate to severe mitral annular calcification present. Regurgitation is trivial by color flow Doppler.  6. Normal tricuspid valve.  7. Tricuspid regurgitation is mild.  8. The aortic valve tricuspid. There is moderate thickening and moderate calcification of the aortic valve. The calculated aortic valve area is 1.18 cm consistent with mild stenosis.  9. No atrial level shunt detected by color flow Doppler.   FINDINGS  Left Ventricle: Definity contrast agent was  given IV to delineate the left ventricular endocardial borders. No evidence of left ventricular regional wall motion abnormalities. The left ventricle has hyperdynamic systolic function of >56%. The cavity  size is mildly increased. There is no left ventricular wall thickness. Echo evidence of pseudonormal diastolic filling patterns. Elevated left ventricular end-diastolic pressure. Right Ventricle: The right ventricle is normal in size. There is normal hypertrophy. There is normal systolic function. Right ventricular systolic pressure is normal with an estimated pressure of 30.5 mmHg. Left Atrium: The left atrium is mildly dilated. Right Atrium: The right atrial size is normal in size. Interatrial Septum: No atrial level shunt detected by color flow Doppler.   Pericardium: There is no evidence of pericardial effusion. Mitral Valve: The mitral valve normal in structure. There is moderate to severe mitral annular calcification present. Regurgitation is trivial by color flow Doppler. Tricuspid Valve: The tricuspid valve is normal in structure. Tricuspid regurgitation is mild by color flow Doppler. Aortic Valve: The aortic valve tricuspid. There is moderate thickening and moderate calcification of the aortic valve. Aortic valve regurgitation was not visualized by color flow Doppler. The calculated aortic valve area is 1.18 cm  consistent with mild  stenosis. Pulmonic Valve: The pulmonic valve is normal. Pulmonic valve regurgitation is trivial by color flow Doppler. Venous: The inferior vena cava was normal in size with greater than 50% respiratory variablity. Additional Findings: There is a dynamic mid LV chamber systolic gradient that is 34mHg at rest and increases to 182mg with Valsalva.       ASSESSMENT:    1. Paroxysmal atrial fibrillation (HCC)   2. Essential hypertension, benign   3. Aortic valve stenosis, etiology of cardiac valve disease unspecified   4. Pacemaker   5. Edema leg      PLAN:  In order of problems listed above:  PAF on pradaxa and flecainide-normal sinus rhythm today.  She says she is not having as many A. fib episodes that she used to.  Will check surveillance labs of C met CBC and TSH  Essential HTN blood pressure stable  Mild AS on echo 07/2018 normal LVEF follow-up echo 07/2019 and visit with Dr. NeMeda Coffeet that time  Pacemaker for SSS device check 01/15/19 normal  Chronic LE edema on HCTZ and metolazone every other day.  Check surveillance labs.  2 g sodium diet.    Medication Adjustments/Labs and Tests Ordered: Current medicines are reviewed at length with the patient today.  Concerns regarding medicines are outlined above.  Medication changes, Labs and Tests ordered today are listed in the Patient Instructions below. Patient Instructions   Medication Instructions:  Your physician recommends that you continue on your current medications as directed. Please refer to the Current Medication list given to you today.  If you need a refill on your cardiac medications before your next appointment, please call your pharmacy.   Lab work: TODAY: CBC, CMET, TSH  If you have labs (blood work) drawn today and your tests are completely normal, you will receive your results only by: . Marland KitchenyChart Message (if you have MyChart) OR . A paper copy in the mail If you have any lab test  that is abnormal or we need to change your treatment, we will call you to review the results.  Testing/Procedures: Your physician has requested that you have an echocardiogram in February 2021. Echocardiography is a painless test that uses sound waves to create images of your heart. It provides your doctor with information about the size and shape  of your heart and how well your heart's chambers and valves are working. This procedure takes approximately one hour. There are no restrictions for this procedure.  Follow-Up: At [facility], you and your health needs are our priority.  As part of our continuing mission to provide you with exceptional heart care, we have created designated Provider Care Teams.  These Care Teams include your primary Cardiologist (physician) and Advanced Practice Providers (APPs -  Physician Assistants and Nurse Practitioners) who all work together to provide you with the care you need, when you need it. . You will need a follow up appointment in February 2021.  Please call our office 2 months in advance to schedule this appointment.  You may see [name] Dawley, MD or one of the following Advanced Practice Providers on your designated Care Team:   . Lyda Jester, PA-C . Dayna Dunn, PA-C . Ermalinda Barrios, PA-C  Any Other Special Instructions Will Be Listed Below (If Applicable).  Two Gram Sodium Diet 2000 mg  What is Sodium? Sodium is a mineral found naturally in many foods. The most significant source of sodium in the diet is table salt, which is about 40% sodium.  Processed, convenience, and preserved foods also contain a large amount of sodium.  The body needs only 500 mg of sodium daily to function,  A normal diet provides more than enough sodium even if you do not use salt.  Why Limit Sodium? A build up of sodium in the body can cause thirst, increased blood pressure, shortness of breath, and water retention.  Decreasing sodium in the diet can reduce edema and  risk of heart attack or stroke associated with high blood pressure.  Keep in mind that there are many other factors involved in these health problems.  Heredity, obesity, lack of exercise, cigarette smoking, stress and what you eat all play a role.  General Guidelines:  Do not add salt at the table or in cooking.  One teaspoon of salt contains over 2 grams of sodium.  Read food labels  Avoid processed and convenience foods  Ask your dietitian before eating any foods not dicussed in the menu planning guidelines  Consult your physician if you wish to use a salt substitute or a sodium containing medication such as antacids.  Limit milk and milk products to 16 oz (2 cups) per day.  Shopping Hints:  READ LABELS!! "Dietetic" does not necessarily mean low sodium.  Salt and other sodium ingredients are often added to foods during processing.   Menu Planning Guidelines Food Group Choose More Often Avoid  Beverages (see also the milk group All fruit juices, low-sodium, salt-free vegetables juices, low-sodium carbonated beverages Regular vegetable or tomato juices, commercially softened water used for drinking or cooking  Breads and Cereals Enriched white, wheat, rye and pumpernickel bread, hard rolls and dinner rolls; muffins, cornbread and waffles; most dry cereals, cooked cereal without added salt; unsalted crackers and breadsticks; low sodium or homemade bread crumbs Bread, rolls and crackers with salted tops; quick breads; instant hot cereals; pancakes; commercial bread stuffing; self-rising flower and biscuit mixes; regular bread crumbs or cracker crumbs  Desserts and Sweets Desserts and sweets mad with mild should be within allowance Instant pudding mixes and cake mixes  Fats Butter or margarine; vegetable oils; unsalted salad dressings, regular salad dressings limited to 1 Tbs; light, sour and heavy cream Regular salad dressings containing bacon fat, bacon bits, and salt pork; snack dips made  with instant soup mixes or processed  cheese; salted nuts  Fruits Most fresh, frozen and canned fruits Fruits processed with salt or sodium-containing ingredient (some dried fruits are processed with sodium sulfites        Vegetables Fresh, frozen vegetables and low- sodium canned vegetables Regular canned vegetables, sauerkraut, pickled vegetables, and others prepared in brine; frozen vegetables in sauces; vegetables seasoned with ham, bacon or salt pork  Condiments, Sauces, Miscellaneous  Salt substitute with physician's approval; pepper, herbs, spices; vinegar, lemon or lime juice; hot pepper sauce; garlic powder, onion powder, low sodium soy sauce (1 Tbs.); low sodium condiments (ketchup, chili sauce, mustard) in limited amounts (1 tsp.) fresh ground horseradish; unsalted tortilla chips, pretzels, potato chips, popcorn, salsa (1/4 cup) Any seasoning made with salt including garlic salt, celery salt, onion salt, and seasoned salt; sea salt, rock salt, kosher salt; meat tenderizers; monosodium glutamate; mustard, regular soy sauce, barbecue, sauce, chili sauce, teriyaki sauce, steak sauce, Worcestershire sauce, and most flavored vinegars; canned gravy and mixes; regular condiments; salted snack foods, olives, picles, relish, horseradish sauce, catsup   Food preparation: Try these seasonings Meats:    Pork Sage, onion Serve with applesauce  Chicken Poultry seasoning, thyme, parsley Serve with cranberry sauce  Lamb Curry powder, rosemary, garlic, thyme Serve with mint sauce or jelly  Veal Marjoram, basil Serve with current jelly, cranberry sauce  Beef Pepper, bay leaf Serve with dry mustard, unsalted chive butter  Fish [place] leaf, dill Serve with unsalted lemon butter, unsalted parsley butter  Vegetables:    Asparagus Lemon juice   Broccoli Lemon juice   Carrots Mustard dressing parsley, mint, nutmeg, glazed with unsalted butter and sugar   Green beans Marjoram, lemon juice, nutmeg,dill seed    Tomatoes Basil, marjoram, onion   Spice /blend for [employer]" 4 tsp ground thyme 1 tsp ground sage 3 tsp ground rosemary 4 tsp ground marjoram   Test your knowledge 1. A product that says "Salt Free" may still contain sodium. True or False 2. Garlic Powder and Hot Pepper Sauce an be used as alternative seasonings.True or False 3. Processed foods have more sodium than fresh foods.  True or False 4. Canned Vegetables have less sodium than froze True or False  WAYS TO DECREASE YOUR SODIUM INTAKE 1. Avoid the use of added salt in cooking and at the table.  Table salt (and other prepared seasonings which contain salt) is probably one of the greatest sources of sodium in the diet.  Unsalted foods can gain flavor from the sweet, sour, and butter taste sensations of herbs and spices.  Instead of using salt for seasoning, try the following seasonings with the foods listed.  Remember: how you use them to enhance natural food flavors is limited only by your creativity... Allspice-Meat, fish, eggs, fruit, peas, red and yellow vegetables Almond Extract-Fruit baked goods Anise Seed-Sweet breads, fruit, carrots, beets, cottage cheese, cookies (tastes like licorice) Basil-Meat, fish, eggs, vegetables, rice, vegetables salads, soups, sauces Bay Leaf-Meat, fish, stews, poultry Burnet-Salad, vegetables (cucumber-like flavor) Caraway Seed-Bread, cookies, cottage cheese, meat, vegetables, cheese, rice Cardamon-Baked goods, fruit, soups Celery Powder or seed-Salads, salad dressings, sauces, meatloaf, soup, bread.Do not use  celery salt Chervil-Meats, salads, fish, eggs, vegetables, cottage cheese (parsley-like flavor) Chili Power-Meatloaf, chicken cheese, corn, eggplant, egg dishes Chives-Salads cottage cheese, egg dishes, soups, vegetables, sauces Cilantro-Salsa, casseroles Cinnamon-Baked goods, fruit, pork, lamb, chicken, carrots Cloves-Fruit, baked goods, fish, pot roast, green beans, beets, carrots  Coriander-Pastry, cookies, meat, salads, cheese (lemon-orange flavor) Cumin-Meatloaf, fish,cheese, eggs, cabbage,fruit pie (caraway flavor)  [employer], fruit, eggs, fish, poultry, cottage cheese, vegetables Dill Seed-Meat, cottage cheese, poultry, vegetables, fish, salads, bread Fennel Seed-Bread, cookies, apples, pork, eggs, fish, beets, cabbage, cheese, Licorice-like flavor Garlic-(buds or powder) Salads, meat, poultry, fish, bread, butter, vegetables, potatoes.Do not  use garlic salt Ginger-Fruit, vegetables, baked goods, meat, fish, poultry Horseradish Root-Meet, vegetables, butter Lemon Juice or Extract-Vegetables, fruit, tea, baked goods, fish salads Mace-Baked goods fruit, vegetables, fish, poultry (taste like nutmeg) Maple Extract-Syrups Marjoram-Meat, chicken, fish, vegetables, breads, green salads (taste like Sage) Mint-Tea, lamb, sherbet, vegetables, desserts, carrots, cabbage Mustard, Dry or Seed-Cheese, eggs, meats, vegetables, poultry Nutmeg-Baked goods, fruit, chicken, eggs, vegetables, desserts Onion Powder-Meat, fish, poultry, vegetables, cheese, eggs, bread, rice salads (Do not use   Onion salt) Orange Extract-Desserts, baked goods Oregano-Pasta, eggs, cheese, onions, pork, lamb, fish, chicken, vegetables, green salads Paprika-Meat, fish, poultry, eggs, cheese, vegetables Parsley Flakes-Butter, vegetables, meat fish, poultry, eggs, bread, salads (certain forms may   Contain sodium Pepper-Meat fish, poultry, vegetables, eggs Peppermint Extract-Desserts, baked goods Poppy Seed-Eggs, bread, cheese, fruit dressings, baked goods, noodles, vegetables, cottage  [employer], poultry, meat, fish, cauliflower, turnips,eggs bread Saffron-Rice, bread, veal, chicken, fish, eggs Sage-Meat, fish, poultry, onions, eggplant, tomateos, pork, stews Savory-Eggs, salads, poultry, meat, rice, vegetables, soups, pork Tarragon-Meat, poultry,  fish, eggs, butter, vegetables (licorice-like flavor)  Thyme-Meat, poultry, fish, eggs, vegetables, (clover-like flavor), sauces, soups Tumeric-Salads, butter, eggs, fish, rice, vegetables (saffron-like flavor) Vanilla Extract-Baked goods, candy Vinegar-Salads, vegetables, meat marinades Walnut Extract-baked goods, candy  2. Choose your Foods Wisely   The following is a list of foods to avoid which are high in sodium:  Meats-Avoid all smoked, canned, salt cured, dried and kosher meat and fish as well as Anchovies   [employer] meats:Bologna, Liverwurst, Pastrami Canned meat or fish  Marinated herring Caviar    Pepperoni Corned Beef   Pizza Dried chipped beef  Salami Frozen breaded fish or meat Salt pork Frankfurters or hot dogs  Sardines Gefilte fish   Sausage Ham (boiled ham, Proscuitto Smoked butt    spiced ham)   Spam      TV Dinners Vegetables Canned vegetables (Regular) Relish Canned mushrooms  Sauerkraut Olives    Tomato juice Pickles  Bakery and Dessert Products Canned puddings  Cream pies Cheesecake   Decorated cakes Cookies  Beverages/Juices Tomato juice, regular  Gatorade   V-8 vegetable juice, regular  Breads and Cereals Biscuit mixes   Salted potato chips, corn chips, pretzels Bread stuffing mixes  Salted crackers and rolls Pancake and waffle mixes Self-rising flour  Seasonings Accent    Meat sauces Barbecue sauce  Meat tenderizer Catsup    Monosodium glutamate (MSG) Celery salt   Onion salt Chili sauce   Prepared mustard Garlic salt   Salt, seasoned salt, sea salt Gravy mixes   Soy sauce Horseradish   Steak sauce Ketchup   Tartar sauce Lite salt    Teriyaki sauce Marinade mixes   Worcestershire sauce  Others Baking powder   Cocoa and cocoa mixes Baking soda   Commercial casserole mixes Candy-caramels, chocolate  Dehydrated soups    Bars, fudge,nougats  Instant rice and pasta mixes Canned broth or soup  Maraschino cherries Cheese,  aged and processed cheese and cheese spreads  Learning Assessment Quiz  Indicated T (for True) or F (for False) for each of the following statements:  1. _____ Fresh fruits and vegetables and unprocessed grains are generally low in sodium 2. _____ Water may contain a considerable  amount of sodium, depending on the source 3. _____ You can always tell if a food is high in sodium by tasting it 4. _____ Certain laxatives my be high in sodium and should be avoided unless prescribed   by a physician or pharmacist 5. _____ Salt substitutes may be used freely by anyone on a sodium restricted diet 6. _____ Sodium is present in table salt, food additives and as a natural component of   most foods 7. _____ Table salt is approximately 90% sodium 8. _____ Limiting sodium intake may help prevent excess fluid accumulation in the body 9. _____ On a sodium-restricted diet, seasonings such as bouillon soy sauce, and    cooking wine should be used in place of table salt 10. _____ On an ingredient list, a product which lists monosodium glutamate as the first   ingredient is an appropriate food to include on a low sodium diet  Circle the best answer(s) to the following statements (Hint: there may be more than one correct answer)  11. On a low-sodium diet, some acceptable snack items are:    A. Olives  F. Bean dip   K. Grapefruit juice    B. Salted Pretzels G. Commercial Popcorn   L. Canned peaches    C. Carrot Sticks  H. Bouillon   M. Unsalted nuts   D. [place] fries  I. Peanut butter crackers N. Salami   E. Sweet pickles J. Tomato Juice   O. Pizza  12.  Seasonings that may be used freely on a reduced - sodium diet include   A. Lemon wedges F.Monosodium glutamate K. Celery seed    B.Soysauce   G. Pepper   L. Mustard powder   C. Sea salt  H. Cooking wine  M. Onion flakes   D. Vinegar  E. Prepared horseradish N. Salsa   E. Sage   J. Worcestershire sauce  O. Chutney      Signed, Ermalinda Barrios,  PA-C  02/10/2019 1:07 PM    [facility] [place], [place], [place]  [id number] Phone: [phone]; Fax: [phone]

## 2019-02-10 ENCOUNTER — Other Ambulatory Visit: Payer: Self-pay

## 2019-02-10 ENCOUNTER — Encounter: Payer: Self-pay | Admitting: Physician Assistant

## 2019-02-10 ENCOUNTER — Encounter

## 2019-02-10 ENCOUNTER — Ambulatory Visit (INDEPENDENT_AMBULATORY_CARE_PROVIDER_SITE_OTHER): Payer: Medicare Other | Admitting: Physician Assistant

## 2019-02-10 ENCOUNTER — Ambulatory Visit: Payer: Medicare Other | Admitting: Cardiology

## 2019-02-10 VITALS — BP 136/70 | HR 60 | Ht 63.0 in | Wt 202.1 lb

## 2019-02-10 DIAGNOSIS — Z950 Presence of cardiac pacemaker: Secondary | ICD-10-CM | POA: Diagnosis not present

## 2019-02-10 DIAGNOSIS — I480 Paroxysmal atrial fibrillation: Secondary | ICD-10-CM | POA: Diagnosis not present

## 2019-02-10 DIAGNOSIS — R600 Localized edema: Secondary | ICD-10-CM

## 2019-02-10 DIAGNOSIS — I10 Essential (primary) hypertension: Secondary | ICD-10-CM | POA: Diagnosis not present

## 2019-02-10 DIAGNOSIS — I350 Nonrheumatic aortic (valve) stenosis: Secondary | ICD-10-CM | POA: Diagnosis not present

## 2019-02-10 NOTE — Patient Instructions (Addendum)
Medication Instructions:  Your physician recommends that you continue on your current medications as directed. Please refer to the Current Medication list given to you today.  If you need a refill on your cardiac medications before your next appointment, please call your pharmacy.   Lab work: TODAY: CBC, CMET, TSH  If you have labs (blood work) drawn today and your tests are completely normal, you will receive your results only by: Marland Kitchen MyChart Message (if you have MyChart) OR . A paper copy in the mail If you have any lab test that is abnormal or we need to change your treatment, we will call you to review the results.  Testing/Procedures: Your physician has requested that you have an echocardiogram in February 2021. Echocardiography is a painless test that uses sound waves to create images of your heart. It provides your doctor with information about the size and shape of your heart and how well your heart's chambers and valves are working. This procedure takes approximately one hour. There are no restrictions for this procedure.  Follow-Up: At [facility], you and your health needs are our priority.  As part of our continuing mission to provide you with exceptional heart care, we have created designated Provider Care Teams.  These Care Teams include your primary Cardiologist (physician) and Advanced Practice Providers (APPs -  Physician Assistants and Nurse Practitioners) who all work together to provide you with the care you need, when you need it. . You will need a follow up appointment in February 2021.  Please call our office 2 months in advance to schedule this appointment.  You may see Ena Dawley, MD or one of the following Advanced Practice Providers on your designated Care Team:   . Lyda Jester, PA-C . Dayna Dunn, PA-C . Ermalinda Barrios, PA-C  Any Other Special Instructions Will Be Listed Below (If Applicable).  Two Gram Sodium Diet 2000 mg  What is Sodium? Sodium is a  mineral found naturally in many foods. The most significant source of sodium in the diet is table salt, which is about 40% sodium.  Processed, convenience, and preserved foods also contain a large amount of sodium.  The body needs only 500 mg of sodium daily to function,  A normal diet provides more than enough sodium even if you do not use salt.  Why Limit Sodium? A build up of sodium in the body can cause thirst, increased blood pressure, shortness of breath, and water retention.  Decreasing sodium in the diet can reduce edema and risk of heart attack or stroke associated with high blood pressure.  Keep in mind that there are many other factors involved in these health problems.  Heredity, obesity, lack of exercise, cigarette smoking, stress and what you eat all play a role.  General Guidelines:  Do not add salt at the table or in cooking.  One teaspoon of salt contains over 2 grams of sodium.  Read food labels  Avoid processed and convenience foods  Ask your dietitian before eating any foods not dicussed in the menu planning guidelines  Consult your physician if you wish to use a salt substitute or a sodium containing medication such as antacids.  Limit milk and milk products to 16 oz (2 cups) per day.  Shopping Hints:  READ LABELS!! "Dietetic" does not necessarily mean low sodium.  Salt and other sodium ingredients are often added to foods during processing.   Menu Planning Guidelines Food Group Choose More Often Avoid  Beverages (see also the  milk group All fruit juices, low-sodium, salt-free vegetables juices, low-sodium carbonated beverages Regular vegetable or tomato juices, commercially softened water used for drinking or cooking  Breads and Cereals Enriched white, wheat, rye and pumpernickel bread, hard rolls and dinner rolls; muffins, cornbread and waffles; most dry cereals, cooked cereal without added salt; unsalted crackers and breadsticks; low sodium or homemade bread crumbs  Bread, rolls and crackers with salted tops; quick breads; instant hot cereals; pancakes; commercial bread stuffing; self-rising flower and biscuit mixes; regular bread crumbs or cracker crumbs  Desserts and Sweets Desserts and sweets mad with mild should be within allowance Instant pudding mixes and cake mixes  Fats Butter or margarine; vegetable oils; unsalted salad dressings, regular salad dressings limited to 1 Tbs; light, sour and heavy cream Regular salad dressings containing bacon fat, bacon bits, and salt pork; snack dips made with instant soup mixes or processed cheese; salted nuts  Fruits Most fresh, frozen and canned fruits Fruits processed with salt or sodium-containing ingredient (some dried fruits are processed with sodium sulfites        Vegetables Fresh, frozen vegetables and low- sodium canned vegetables Regular canned vegetables, sauerkraut, pickled vegetables, and others prepared in brine; frozen vegetables in sauces; vegetables seasoned with ham, bacon or salt pork  Condiments, Sauces, Miscellaneous  Salt substitute with physician's approval; pepper, herbs, spices; vinegar, lemon or lime juice; hot pepper sauce; garlic powder, onion powder, low sodium soy sauce (1 Tbs.); low sodium condiments (ketchup, chili sauce, mustard) in limited amounts (1 tsp.) fresh ground horseradish; unsalted tortilla chips, pretzels, potato chips, popcorn, salsa (1/4 cup) Any seasoning made with salt including garlic salt, celery salt, onion salt, and seasoned salt; sea salt, rock salt, kosher salt; meat tenderizers; monosodium glutamate; mustard, regular soy sauce, barbecue, sauce, chili sauce, teriyaki sauce, steak sauce, Worcestershire sauce, and most flavored vinegars; canned gravy and mixes; regular condiments; salted snack foods, olives, picles, relish, horseradish sauce, catsup   Food preparation: Try these seasonings Meats:    Pork Sage, onion Serve with applesauce  Chicken Poultry seasoning,  thyme, parsley Serve with cranberry sauce  Lamb Curry powder, rosemary, garlic, thyme Serve with mint sauce or jelly  Veal Marjoram, basil Serve with current jelly, cranberry sauce  Beef Pepper, bay leaf Serve with dry mustard, unsalted chive butter  Fish [place] leaf, dill Serve with unsalted lemon butter, unsalted parsley butter  Vegetables:    Asparagus Lemon juice   Broccoli Lemon juice   Carrots Mustard dressing parsley, mint, nutmeg, glazed with unsalted butter and sugar   Green beans Marjoram, lemon juice, nutmeg,dill seed   Tomatoes Basil, marjoram, onion   Spice /blend for [employer]" 4 tsp ground thyme 1 tsp ground sage 3 tsp ground rosemary 4 tsp ground marjoram   Test your knowledge 1. A product that says "Salt Free" may still contain sodium. True or False 2. Garlic Powder and Hot Pepper Sauce an be used as alternative seasonings.True or False 3. Processed foods have more sodium than fresh foods.  True or False 4. Canned Vegetables have less sodium than froze True or False  WAYS TO DECREASE YOUR SODIUM INTAKE 1. Avoid the use of added salt in cooking and at the table.  Table salt (and other prepared seasonings which contain salt) is probably one of the greatest sources of sodium in the diet.  Unsalted foods can gain flavor from the sweet, sour, and butter taste sensations of herbs and spices.  Instead of using salt for seasoning, try the  following seasonings with the foods listed.  Remember: how you use them to enhance natural food flavors is limited only by your creativity... Allspice-Meat, fish, eggs, fruit, peas, red and yellow vegetables Almond Extract-Fruit baked goods Anise Seed-Sweet breads, fruit, carrots, beets, cottage cheese, cookies (tastes like licorice) Basil-Meat, fish, eggs, vegetables, rice, vegetables salads, soups, sauces Bay Leaf-Meat, fish, stews, poultry Burnet-Salad, vegetables (cucumber-like flavor) Caraway Seed-Bread, cookies, cottage cheese, meat,  vegetables, cheese, rice Cardamon-Baked goods, fruit, soups Celery Powder or seed-Salads, salad dressings, sauces, meatloaf, soup, bread.Do not use  celery salt Chervil-Meats, salads, fish, eggs, vegetables, cottage cheese (parsley-like flavor) Chili Power-Meatloaf, chicken cheese, corn, eggplant, egg dishes Chives-Salads cottage cheese, egg dishes, soups, vegetables, sauces Cilantro-Salsa, casseroles Cinnamon-Baked goods, fruit, pork, lamb, chicken, carrots Cloves-Fruit, baked goods, fish, pot roast, green beans, beets, carrots Coriander-Pastry, cookies, meat, salads, cheese (lemon-orange flavor) Cumin-Meatloaf, fish,cheese, eggs, cabbage,fruit pie (caraway flavor) [employer], fruit, eggs, fish, poultry, cottage cheese, vegetables Dill Seed-Meat, cottage cheese, poultry, vegetables, fish, salads, bread Fennel Seed-Bread, cookies, apples, pork, eggs, fish, beets, cabbage, cheese, Licorice-like flavor Garlic-(buds or powder) Salads, meat, poultry, fish, bread, butter, vegetables, potatoes.Do not  use garlic salt Ginger-Fruit, vegetables, baked goods, meat, fish, poultry Horseradish Root-Meet, vegetables, butter Lemon Juice or Extract-Vegetables, fruit, tea, baked goods, fish salads Mace-Baked goods fruit, vegetables, fish, poultry (taste like nutmeg) Maple Extract-Syrups Marjoram-Meat, chicken, fish, vegetables, breads, green salads (taste like Sage) Mint-Tea, lamb, sherbet, vegetables, desserts, carrots, cabbage Mustard, Dry or Seed-Cheese, eggs, meats, vegetables, poultry Nutmeg-Baked goods, fruit, chicken, eggs, vegetables, desserts Onion Powder-Meat, fish, poultry, vegetables, cheese, eggs, bread, rice salads (Do not use   Onion salt) Orange Extract-Desserts, baked goods Oregano-Pasta, eggs, cheese, onions, pork, lamb, fish, chicken, vegetables, green salads Paprika-Meat, fish, poultry, eggs, cheese, vegetables Parsley Flakes-Butter, vegetables, meat fish, poultry, eggs, bread,  salads (certain forms may   Contain sodium Pepper-Meat fish, poultry, vegetables, eggs Peppermint Extract-Desserts, baked goods Poppy Seed-Eggs, bread, cheese, fruit dressings, baked goods, noodles, vegetables, cottage  [employer], poultry, meat, fish, cauliflower, turnips,eggs bread Saffron-Rice, bread, veal, chicken, fish, eggs Sage-Meat, fish, poultry, onions, eggplant, tomateos, pork, stews Savory-Eggs, salads, poultry, meat, rice, vegetables, soups, pork Tarragon-Meat, poultry, fish, eggs, butter, vegetables (licorice-like flavor)  Thyme-Meat, poultry, fish, eggs, vegetables, (clover-like flavor), sauces, soups Tumeric-Salads, butter, eggs, fish, rice, vegetables (saffron-like flavor) Vanilla Extract-Baked goods, candy Vinegar-Salads, vegetables, meat marinades Walnut Extract-baked goods, candy  2. Choose your Foods Wisely   The following is a list of foods to avoid which are high in sodium:  Meats-Avoid all smoked, canned, salt cured, dried and kosher meat and fish as well as Anchovies   [employer] meats:Bologna, Liverwurst, Pastrami Canned meat or fish  Marinated herring Caviar    Pepperoni Corned Beef   Pizza Dried chipped beef  Salami Frozen breaded fish or meat Salt pork Frankfurters or hot dogs  Sardines Gefilte fish   Sausage Ham (boiled ham, Proscuitto Smoked butt    spiced ham)   Spam      TV Dinners Vegetables Canned vegetables (Regular) Relish Canned mushrooms  Sauerkraut Olives    Tomato juice Pickles  Bakery and Dessert Products Canned puddings  Cream pies Cheesecake   Decorated cakes Cookies  Beverages/Juices Tomato juice, regular  Gatorade   V-8 vegetable juice, regular  Breads and Cereals Biscuit mixes   Salted potato chips, corn chips, pretzels Bread stuffing mixes  Salted crackers and rolls Pancake and waffle mixes Self-rising flour  Seasonings Accent    Meat  sauces Barbecue sauce  Meat  tenderizer Catsup    Monosodium glutamate (MSG) Celery salt   Onion salt Chili sauce   Prepared mustard Garlic salt   Salt, seasoned salt, sea salt Gravy mixes   Soy sauce Horseradish   Steak sauce Ketchup   Tartar sauce Lite salt    Teriyaki sauce Marinade mixes   Worcestershire sauce  Others Baking powder   Cocoa and cocoa mixes Baking soda   Commercial casserole mixes Candy-caramels, chocolate  Dehydrated soups    Bars, fudge,nougats  Instant rice and pasta mixes Canned broth or soup  Maraschino cherries Cheese, aged and processed cheese and cheese spreads  Learning Assessment Quiz  Indicated T (for True) or F (for False) for each of the following statements:  1. _____ Fresh fruits and vegetables and unprocessed grains are generally low in sodium 2. _____ Water may contain a considerable amount of sodium, depending on the source 3. _____ You can always tell if a food is high in sodium by tasting it 4. _____ Certain laxatives my be high in sodium and should be avoided unless prescribed   by a physician or pharmacist 5. _____ Salt substitutes may be used freely by anyone on a sodium restricted diet 6. _____ Sodium is present in table salt, food additives and as a natural component of   most foods 7. _____ Table salt is approximately 90% sodium 8. _____ Limiting sodium intake may help prevent excess fluid accumulation in the body 9. _____ On a sodium-restricted diet, seasonings such as bouillon soy sauce, and    cooking wine should be used in place of table salt 10. _____ On an ingredient list, a product which lists monosodium glutamate as the first   ingredient is an appropriate food to include on a low sodium diet  Circle the best answer(s) to the following statements (Hint: there may be more than one correct answer)  11. On a low-sodium diet, some acceptable snack items are:    A. Olives  F. Bean dip   K. Grapefruit juice    B. Salted Pretzels G. Commercial Popcorn   L.  Canned peaches    C. Carrot Sticks  H. Bouillon   M. Unsalted nuts   D. [place] fries  I. Peanut butter crackers N. Salami   E. Sweet pickles J. Tomato Juice   O. Pizza  12.  Seasonings that may be used freely on a reduced - sodium diet include   A. Lemon wedges F.Monosodium glutamate K. Celery seed    B.Soysauce   G. Pepper   L. Mustard powder   C. Sea salt  H. Cooking wine  M. Onion flakes   D. Vinegar  E. Prepared horseradish N. Salsa   E. Sage   J. Worcestershire sauce  O. Chutney

## 2019-02-11 LAB — COMPREHENSIVE METABOLIC PANEL
ALT: 20 IU/L (ref 0–32)
AST: 37 IU/L (ref 0–40)
Albumin/Globulin Ratio: 1.5 (ref 1.2–2.2)
Albumin: 4.1 g/dL (ref 3.6–4.6)
Alkaline Phosphatase: 41 IU/L (ref 39–117)
BUN/Creatinine Ratio: 15 (ref 12–28)
BUN: 15 mg/dL (ref 8–27)
Bilirubin Total: 0.6 mg/dL (ref 0.0–1.2)
CO2: 28 mmol/L (ref 20–29)
Calcium: 9.6 mg/dL (ref 8.7–10.3)
Chloride: 101 mmol/L (ref 96–106)
Creatinine, Ser: 0.97 mg/dL (ref 0.57–1.00)
GFR calc Af Amer: 60 mL/min/{1.73_m2} (ref >59)
GFR calc non Af Amer: 52 mL/min/{1.73_m2} — ABNORMAL LOW (ref >59)
Globulin, Total: 2.8 g/dL (ref 1.5–4.5)
Glucose: 83 mg/dL (ref 65–99)
Potassium: 3.6 mmol/L (ref 3.5–5.2)
Sodium: 145 mmol/L — ABNORMAL HIGH (ref 134–144)
Total Protein: 6.9 g/dL (ref 6.0–8.5)

## 2019-02-11 LAB — CBC
Hematocrit: 44.7 % (ref 34.0–46.6)
Hemoglobin: 14.7 g/dL (ref 11.1–15.9)
MCH: 32.2 pg (ref 26.6–33.0)
MCHC: 32.9 g/dL (ref 31.5–35.7)
MCV: 98 fL — ABNORMAL HIGH (ref 79–97)
Platelets: 167 10*3/uL (ref 150–450)
RBC: 4.56 x10E6/uL (ref 3.77–5.28)
RDW: 13.1 % (ref 11.7–15.4)
WBC: 7.3 10*3/uL (ref 3.4–10.8)

## 2019-02-11 LAB — TSH: TSH: 2.62 u[IU]/mL (ref 0.450–4.500)

## 2019-02-24 DIAGNOSIS — Z23 Encounter for immunization: Secondary | ICD-10-CM | POA: Diagnosis not present

## 2019-03-31 DIAGNOSIS — E039 Hypothyroidism, unspecified: Secondary | ICD-10-CM | POA: Diagnosis not present

## 2019-03-31 DIAGNOSIS — I480 Paroxysmal atrial fibrillation: Secondary | ICD-10-CM | POA: Diagnosis not present

## 2019-03-31 DIAGNOSIS — I10 Essential (primary) hypertension: Secondary | ICD-10-CM | POA: Diagnosis not present

## 2019-03-31 DIAGNOSIS — I495 Sick sinus syndrome: Secondary | ICD-10-CM | POA: Diagnosis not present

## 2019-03-31 DIAGNOSIS — I350 Nonrheumatic aortic (valve) stenosis: Secondary | ICD-10-CM | POA: Diagnosis not present

## 2019-04-17 ENCOUNTER — Ambulatory Visit: Payer: Medicare Other | Admitting: *Deleted

## 2019-05-13 ENCOUNTER — Telehealth: Payer: Self-pay | Admitting: Cardiology

## 2019-05-13 ENCOUNTER — Other Ambulatory Visit: Payer: Self-pay | Admitting: Cardiology

## 2019-05-13 MED ORDER — METOPROLOL SUCCINATE ER 25 MG PO TB24: 75.0000 mg | ORAL_TABLET | Freq: Every day | ORAL | 0 refills | Status: DC

## 2019-05-13 NOTE — Telephone Encounter (Signed)
Called pt's daughter back to inform her that we do not have samples of metoprolol and daughter stated that the pt was out of medication and that pt still has received medications from [facility] and that it was in the mail. I explained to the daughter that I could send in a 30 day supply to local pharmacy so pt could get her meds. Daughter verbalized understanding. Rx sent. Confirmation received.

## 2019-05-13 NOTE — Telephone Encounter (Signed)
Pt's medication was sent to pt's pharmacy as requested. Confirmation received.  °

## 2019-05-13 NOTE — Telephone Encounter (Signed)
Patient calling the office for samples of medication:   1.  What medication and dosage are you requesting samples for? metoprolol succinate (TOPROL-XL) 25 MG 24 hr tablet  2.  Are you currently out of this medication? Yes  Pat Kocher to pick up          *STAT* If patient is at the pharmacy, call can be transferred to refill team.   1. Which medications need to be refilled? (please list name of each medication and dose if known) metoprolol succinate (TOPROL-XL) 25 MG 24 hr tablet  2. Which pharmacy/location (including street and city if local pharmacy) is medication to be sent to? [facility] - [place], [place] - [address]  3. Do they need a 30 day or 90 day supply? [place]

## 2019-06-04 ENCOUNTER — Other Ambulatory Visit: Payer: Self-pay | Admitting: Cardiology

## 2019-06-15 DIAGNOSIS — Z23 Encounter for immunization: Secondary | ICD-10-CM | POA: Diagnosis not present

## 2019-07-13 DIAGNOSIS — Z23 Encounter for immunization: Secondary | ICD-10-CM | POA: Diagnosis not present

## 2019-07-14 DIAGNOSIS — N816 Rectocele: Secondary | ICD-10-CM | POA: Diagnosis not present

## 2019-07-14 DIAGNOSIS — L309 Dermatitis, unspecified: Secondary | ICD-10-CM | POA: Diagnosis not present

## 2019-07-14 DIAGNOSIS — M8580 Other specified disorders of bone density and structure, unspecified site: Secondary | ICD-10-CM | POA: Diagnosis not present

## 2019-07-14 DIAGNOSIS — H25011 Cortical age-related cataract, right eye: Secondary | ICD-10-CM | POA: Diagnosis not present

## 2019-07-14 DIAGNOSIS — R2681 Unsteadiness on feet: Secondary | ICD-10-CM | POA: Diagnosis not present

## 2019-07-14 DIAGNOSIS — I4891 Unspecified atrial fibrillation: Secondary | ICD-10-CM | POA: Diagnosis not present

## 2019-07-14 DIAGNOSIS — M1990 Unspecified osteoarthritis, unspecified site: Secondary | ICD-10-CM | POA: Diagnosis not present

## 2019-07-14 DIAGNOSIS — E871 Hypo-osmolality and hyponatremia: Secondary | ICD-10-CM | POA: Diagnosis not present

## 2019-07-14 DIAGNOSIS — C4331 Malignant melanoma of nose: Secondary | ICD-10-CM | POA: Diagnosis not present

## 2019-07-14 DIAGNOSIS — E876 Hypokalemia: Secondary | ICD-10-CM | POA: Diagnosis not present

## 2019-07-14 DIAGNOSIS — Z9660 Presence of unspecified orthopedic joint implant: Secondary | ICD-10-CM | POA: Diagnosis not present

## 2019-07-14 DIAGNOSIS — M4800 Spinal stenosis, site unspecified: Secondary | ICD-10-CM | POA: Diagnosis not present

## 2019-07-14 DIAGNOSIS — N3281 Overactive bladder: Secondary | ICD-10-CM | POA: Diagnosis not present

## 2019-07-14 DIAGNOSIS — E781 Pure hyperglyceridemia: Secondary | ICD-10-CM | POA: Diagnosis not present

## 2019-07-14 DIAGNOSIS — H538 Other visual disturbances: Secondary | ICD-10-CM | POA: Diagnosis not present

## 2019-07-14 DIAGNOSIS — M6281 Muscle weakness (generalized): Secondary | ICD-10-CM | POA: Diagnosis not present

## 2019-07-16 ENCOUNTER — Other Ambulatory Visit: Payer: Self-pay

## 2019-07-16 ENCOUNTER — Encounter (INDEPENDENT_AMBULATORY_CARE_PROVIDER_SITE_OTHER): Payer: Self-pay

## 2019-07-16 ENCOUNTER — Ambulatory Visit (HOSPITAL_COMMUNITY): Payer: Medicare Other | Attending: Cardiology

## 2019-07-16 DIAGNOSIS — I350 Nonrheumatic aortic (valve) stenosis: Secondary | ICD-10-CM | POA: Insufficient documentation

## 2019-07-17 ENCOUNTER — Ambulatory Visit (INDEPENDENT_AMBULATORY_CARE_PROVIDER_SITE_OTHER): Payer: Medicare Other | Admitting: *Deleted

## 2019-07-17 DIAGNOSIS — Z950 Presence of cardiac pacemaker: Secondary | ICD-10-CM

## 2019-07-17 DIAGNOSIS — I4891 Unspecified atrial fibrillation: Secondary | ICD-10-CM | POA: Diagnosis not present

## 2019-07-17 DIAGNOSIS — R2681 Unsteadiness on feet: Secondary | ICD-10-CM | POA: Diagnosis not present

## 2019-07-17 DIAGNOSIS — N3281 Overactive bladder: Secondary | ICD-10-CM | POA: Diagnosis not present

## 2019-07-17 DIAGNOSIS — M6281 Muscle weakness (generalized): Secondary | ICD-10-CM | POA: Diagnosis not present

## 2019-07-17 DIAGNOSIS — E781 Pure hyperglyceridemia: Secondary | ICD-10-CM | POA: Diagnosis not present

## 2019-07-17 DIAGNOSIS — M8580 Other specified disorders of bone density and structure, unspecified site: Secondary | ICD-10-CM | POA: Diagnosis not present

## 2019-07-17 LAB — CUP PACEART REMOTE DEVICE CHECK
Battery Remaining Longevity: 126 mo
Battery Remaining Percentage: 95.5 %
Battery Voltage: 2.99 V
Brady Statistic AP VP Percent: 2.2 %
Brady Statistic AP VS Percent: 96 %
Brady Statistic AS VP Percent: 1 %
Brady Statistic AS VS Percent: 1.4 %
Brady Statistic RA Percent Paced: 97 %
Brady Statistic RV Percent Paced: 2.7 %
Date Time Interrogation Session: 20210205040014
Implantable Lead Implant Date: 20091108190000
Implantable Lead Implant Date: 20091108190000
Implantable Lead Location: 753859
Implantable Lead Location: 753860
Implantable Lead Model: 350
Implantable Lead Serial Number: 24891460
Implantable Lead Serial Number: 28411861
Implantable Pulse Generator Implant Date: 20170226190000
Lead Channel Impedance Value: 1600 Ohm
Lead Channel Impedance Value: 510 Ohm
Lead Channel Pacing Threshold Amplitude: 1 V
Lead Channel Pacing Threshold Amplitude: 1 V
Lead Channel Pacing Threshold Pulse Width: 0.4 ms
Lead Channel Pacing Threshold Pulse Width: 0.4 ms
Lead Channel Sensing Intrinsic Amplitude: 1 mV
Lead Channel Sensing Intrinsic Amplitude: 12 mV
Lead Channel Setting Pacing Amplitude: 2 V
Lead Channel Setting Pacing Amplitude: 2.5 V
Lead Channel Setting Pacing Pulse Width: 0.4 ms
Lead Channel Setting Sensing Sensitivity: 2 mV
Pulse Gen Model: 2240
Pulse Gen Serial Number: 7885781

## 2019-07-17 NOTE — Progress Notes (Signed)
PPM Remote  

## 2019-07-21 DIAGNOSIS — I4891 Unspecified atrial fibrillation: Secondary | ICD-10-CM | POA: Diagnosis not present

## 2019-07-21 DIAGNOSIS — R2681 Unsteadiness on feet: Secondary | ICD-10-CM | POA: Diagnosis not present

## 2019-07-21 DIAGNOSIS — M6281 Muscle weakness (generalized): Secondary | ICD-10-CM | POA: Diagnosis not present

## 2019-07-21 DIAGNOSIS — M8580 Other specified disorders of bone density and structure, unspecified site: Secondary | ICD-10-CM | POA: Diagnosis not present

## 2019-07-21 DIAGNOSIS — E781 Pure hyperglyceridemia: Secondary | ICD-10-CM | POA: Diagnosis not present

## 2019-07-21 DIAGNOSIS — N3281 Overactive bladder: Secondary | ICD-10-CM | POA: Diagnosis not present

## 2019-07-24 DIAGNOSIS — M6281 Muscle weakness (generalized): Secondary | ICD-10-CM | POA: Diagnosis not present

## 2019-07-24 DIAGNOSIS — E781 Pure hyperglyceridemia: Secondary | ICD-10-CM | POA: Diagnosis not present

## 2019-07-24 DIAGNOSIS — M8580 Other specified disorders of bone density and structure, unspecified site: Secondary | ICD-10-CM | POA: Diagnosis not present

## 2019-07-24 DIAGNOSIS — R2681 Unsteadiness on feet: Secondary | ICD-10-CM | POA: Diagnosis not present

## 2019-07-24 DIAGNOSIS — I4891 Unspecified atrial fibrillation: Secondary | ICD-10-CM | POA: Diagnosis not present

## 2019-07-24 DIAGNOSIS — N3281 Overactive bladder: Secondary | ICD-10-CM | POA: Diagnosis not present

## 2019-07-28 DIAGNOSIS — M6281 Muscle weakness (generalized): Secondary | ICD-10-CM | POA: Diagnosis not present

## 2019-07-28 DIAGNOSIS — E781 Pure hyperglyceridemia: Secondary | ICD-10-CM | POA: Diagnosis not present

## 2019-07-28 DIAGNOSIS — N3281 Overactive bladder: Secondary | ICD-10-CM | POA: Diagnosis not present

## 2019-07-28 DIAGNOSIS — I4891 Unspecified atrial fibrillation: Secondary | ICD-10-CM | POA: Diagnosis not present

## 2019-07-28 DIAGNOSIS — M8580 Other specified disorders of bone density and structure, unspecified site: Secondary | ICD-10-CM | POA: Diagnosis not present

## 2019-07-28 DIAGNOSIS — R2681 Unsteadiness on feet: Secondary | ICD-10-CM | POA: Diagnosis not present

## 2019-07-31 DIAGNOSIS — E781 Pure hyperglyceridemia: Secondary | ICD-10-CM | POA: Diagnosis not present

## 2019-07-31 DIAGNOSIS — M6281 Muscle weakness (generalized): Secondary | ICD-10-CM | POA: Diagnosis not present

## 2019-07-31 DIAGNOSIS — R2681 Unsteadiness on feet: Secondary | ICD-10-CM | POA: Diagnosis not present

## 2019-07-31 DIAGNOSIS — I4891 Unspecified atrial fibrillation: Secondary | ICD-10-CM | POA: Diagnosis not present

## 2019-07-31 DIAGNOSIS — M8580 Other specified disorders of bone density and structure, unspecified site: Secondary | ICD-10-CM | POA: Diagnosis not present

## 2019-07-31 DIAGNOSIS — N3281 Overactive bladder: Secondary | ICD-10-CM | POA: Diagnosis not present

## 2019-08-03 ENCOUNTER — Other Ambulatory Visit: Payer: Self-pay | Admitting: Cardiology

## 2019-08-03 DIAGNOSIS — E876 Hypokalemia: Secondary | ICD-10-CM

## 2019-08-03 DIAGNOSIS — I10 Essential (primary) hypertension: Secondary | ICD-10-CM

## 2019-08-03 DIAGNOSIS — I480 Paroxysmal atrial fibrillation: Secondary | ICD-10-CM

## 2019-08-03 DIAGNOSIS — Z950 Presence of cardiac pacemaker: Secondary | ICD-10-CM

## 2019-08-03 DIAGNOSIS — R600 Localized edema: Secondary | ICD-10-CM

## 2019-08-03 DIAGNOSIS — I4819 Other persistent atrial fibrillation: Secondary | ICD-10-CM

## 2019-08-03 MED ORDER — DABIGATRAN ETEXILATE MESYLATE 150 MG PO CAPS: 150.0000 mg | ORAL_CAPSULE | Freq: Two times a day (BID) | ORAL | 2 refills | Status: DC

## 2019-08-03 MED ORDER — METOLAZONE 2.5 MG PO TABS: 2.5000 mg | ORAL_TABLET | ORAL | 3 refills | Status: DC

## 2019-08-03 MED ORDER — FLECAINIDE ACETATE 50 MG PO TABS: 50.0000 mg | ORAL_TABLET | Freq: Two times a day (BID) | ORAL | 3 refills | Status: DC

## 2019-08-03 MED ORDER — METOPROLOL SUCCINATE ER 25 MG PO TB24: 75.0000 mg | ORAL_TABLET | Freq: Every day | ORAL | 2 refills | Status: DC

## 2019-08-03 NOTE — Telephone Encounter (Signed)
*  STAT* If patient is at the pharmacy, call can be transferred to refill team.   1. Which medications need to be refilled? (please list name of each medication and dose if known)  metolazone (ZAROXOLYN) 2.5 MG tablet flecainide (TAMBOCOR) 50 MG tablet dabigatran (PRADAXA) 150 MG CAPS capsule metoprolol succinate (TOPROL-XL) 25 MG 24 hr tablet  2. Which pharmacy/location (including street and city if local pharmacy) is medication to be sent to? [facility] - [place], [place] - [address]  3. Do they need a 30 day or 90 day supply? 90 day

## 2019-08-03 NOTE — Telephone Encounter (Signed)
I have filled Metolazone, Flecainide, and Metoprolol. Pradaxa needs to be sent in.

## 2019-08-03 NOTE — Telephone Encounter (Signed)
Pradaxa 150mg  refill request received. Pt is 86 years old, weight-91.7kg, Crea-0.97 on 02/10/2019, last seen by Estella Husk on 02/10/2019, Diagnosis-Afib, CrCl-56.34ml/min; Dose is appropriate based on dosing criteria. Will send in refill to requested pharmacy.

## 2019-09-10 ENCOUNTER — Emergency Department (HOSPITAL_COMMUNITY): Payer: Medicare Other

## 2019-09-10 ENCOUNTER — Emergency Department (HOSPITAL_BASED_OUTPATIENT_CLINIC_OR_DEPARTMENT_OTHER)
Admit: 2019-09-10 | Discharge: 2019-09-10 | Disposition: A | Discharge status: Home / Self Care | Payer: Medicare Other | Attending: Emergency Medicine | Admitting: Emergency Medicine

## 2019-09-10 ENCOUNTER — Emergency Department (HOSPITAL_COMMUNITY)
Admission: EM | Admit: 2019-09-10 | Discharge: 2019-09-10 | Disposition: A | Discharge status: Home / Self Care | Payer: Medicare Other | Attending: Emergency Medicine | Admitting: Emergency Medicine

## 2019-09-10 ENCOUNTER — Encounter (HOSPITAL_COMMUNITY): Payer: Self-pay | Admitting: Emergency Medicine

## 2019-09-10 ENCOUNTER — Telehealth: Payer: Self-pay | Admitting: Cardiology

## 2019-09-10 DIAGNOSIS — Z87891 Personal history of nicotine dependence: Secondary | ICD-10-CM | POA: Insufficient documentation

## 2019-09-10 DIAGNOSIS — Z79899 Other long term (current) drug therapy: Secondary | ICD-10-CM | POA: Diagnosis not present

## 2019-09-10 DIAGNOSIS — E039 Hypothyroidism, unspecified: Secondary | ICD-10-CM | POA: Diagnosis not present

## 2019-09-10 DIAGNOSIS — I10 Essential (primary) hypertension: Secondary | ICD-10-CM | POA: Diagnosis not present

## 2019-09-10 DIAGNOSIS — M7989 Other specified soft tissue disorders: Secondary | ICD-10-CM | POA: Diagnosis not present

## 2019-09-10 DIAGNOSIS — S9032XA Contusion of left foot, initial encounter: Secondary | ICD-10-CM | POA: Diagnosis not present

## 2019-09-10 DIAGNOSIS — I4891 Unspecified atrial fibrillation: Secondary | ICD-10-CM | POA: Diagnosis not present

## 2019-09-10 DIAGNOSIS — R2242 Localized swelling, mass and lump, left lower limb: Secondary | ICD-10-CM | POA: Diagnosis present

## 2019-09-10 DIAGNOSIS — R600 Localized edema: Secondary | ICD-10-CM | POA: Diagnosis not present

## 2019-09-10 LAB — COMPREHENSIVE METABOLIC PANEL
ALT: 18 U/L (ref 0–44)
AST: 36 U/L (ref 15–41)
Albumin: 3.5 g/dL (ref 3.5–5.0)
Alkaline Phosphatase: 34 U/L — ABNORMAL LOW (ref 38–126)
Anion gap: 11 (ref 5–15)
BUN: 17 mg/dL (ref 8–23)
CO2: 29 mmol/L (ref 22–32)
Calcium: 9.4 mg/dL (ref 8.9–10.3)
Chloride: 101 mmol/L (ref 98–111)
Creatinine, Ser: 0.87 mg/dL (ref 0.44–1.00)
GFR calc Af Amer: >60 mL/min (ref >60)
GFR calc non Af Amer: 59 mL/min — ABNORMAL LOW (ref >60)
Glucose, Bld: 103 mg/dL — ABNORMAL HIGH (ref 70–99)
Potassium: 3.3 mmol/L — ABNORMAL LOW (ref 3.5–5.1)
Sodium: 141 mmol/L (ref 135–145)
Total Bilirubin: 0.9 mg/dL (ref 0.3–1.2)
Total Protein: 6.8 g/dL (ref 6.5–8.1)

## 2019-09-10 LAB — CBC WITH DIFFERENTIAL/PLATELET
Abs Immature Granulocytes: 0.01 10*3/uL (ref 0.00–0.07)
Basophils Absolute: 0.1 10*3/uL (ref 0.0–0.1)
Basophils Relative: 1 %
Eosinophils Absolute: 0.4 10*3/uL (ref 0.0–0.5)
Eosinophils Relative: 8 %
HCT: 43 % (ref 36.0–46.0)
Hemoglobin: 14.3 g/dL (ref 12.0–15.0)
Immature Granulocytes: 0 %
Lymphocytes Relative: 28 %
Lymphs Abs: 1.5 10*3/uL (ref 0.7–4.0)
MCH: 32.6 pg (ref 26.0–34.0)
MCHC: 33.3 g/dL (ref 30.0–36.0)
MCV: 98.2 fL (ref 80.0–100.0)
Monocytes Absolute: 0.6 10*3/uL (ref 0.1–1.0)
Monocytes Relative: 11 %
Neutro Abs: 2.7 10*3/uL (ref 1.7–7.7)
Neutrophils Relative %: 52 %
Platelets: 144 10*3/uL — ABNORMAL LOW (ref 150–400)
RBC: 4.38 MIL/uL (ref 3.87–5.11)
RDW: 12.6 % (ref 11.5–15.5)
WBC: 5.3 10*3/uL (ref 4.0–10.5)
nRBC: 0 % (ref 0.0–0.2)

## 2019-09-10 LAB — PROTIME-INR
INR: 2.4 — ABNORMAL HIGH (ref 0.8–1.2)
Prothrombin Time: 25.6 seconds — ABNORMAL HIGH (ref 11.4–15.2)

## 2019-09-10 MED ORDER — POTASSIUM CHLORIDE CRYS ER 20 MEQ PO TBCR
20.0000 meq | EXTENDED_RELEASE_TABLET | Freq: Once | ORAL | Status: AC
  Administered 2019-09-10: 16:00:00 20 meq via ORAL
  Filled 2019-09-10: qty 1

## 2019-09-10 NOTE — Telephone Encounter (Signed)
Pts daughter is calling to report that her mother called her today and said that the top of her left foot all the way to her toes are "black" in color, and the pt is unable to walk on that extremity. Daughter states that her mom reports that the appearance is "black" and not "blue" anymore, and it looks like its spreading up that left extremity. Daughter states the pt did not say its red or warm to touch, just black and discolored.  Daughter states the pt is also complaining of tingling in both of her hands.  Daughter states that the pt lives alone, and she has not been able to see what her extremities look like, so there is no telling how long this extremity has been discolored.  Daughter is seeking recommendation, but she feels that the pt going to the ER would be the best decision.  I advised that the pt needs to go to the ER at [facility] immediately.  I advised the pts daughter to even call 911 to have her transferred there, for she could be having a number of things going on with that extremity.  Informed the pts daughter if her left extremity is appearing as she states, she therefore is not getting the appropriate blood flow to that area, which would meet urgent criteria to call 911, so that if this is from poor circulation, hopefully they can get her immediate medical attention to help save that extremity.  Pt has no history of PAD or diabetes.  I asked the pts daughter if she had trauma to that extremity lately, and she said she didn't think so.  Pts daughter states she is not complaining of sob, doe, chest pain, dizziness, pre-syncopal or syncopal episodes.  Advised the pts daughter to call 911 now and have her transported.  Informed the pts daughter that I will make Dr. Meda Coffee aware of this, by routing this message as an FYI, to review upon return to the office. Daughter verbalized understanding and agrees with this plan. Daughter states she is calling 911 now.

## 2019-09-10 NOTE — Discharge Instructions (Signed)
Whenever you are at rest, elevate your leg and apply ice.  Be sure to wear the compression stockings.  Follow-up with your primary care physician if not improving.

## 2019-09-10 NOTE — Telephone Encounter (Signed)
Pt is currently at [facility] ER for further evaluation.

## 2019-09-10 NOTE — ED Provider Notes (Signed)
[facility] EMERGENCY DEPARTMENT Provider Note   CSN: [id number] Arrival date & time: 09/10/19  1221     History Chief Complaint  Patient presents with   Circulatory Problem    [name] is a 86 y.o. female.  HPI 86 year old female presents with left foot bruising/swelling. Started ~3 days ago.  No trauma.  She is able to ambulate without significant difficulty.  She states there is no pain when she walks.  She denies being on a blood thinner though Pradaxa is on her med list.  No significant calf pain.  No weakness or numbness.  Skin color is darkened and was being described as black to the daughter.  Also notes tingling in her hands bilaterally for months.   Past Medical History:  Diagnosis Date   Brady-tachy syndrome ([place])    a. Biotronik dual chamber PPM implanted 2009 b. gen change to STJ dual chamber PPM 2017   Hair loss    Patient questioned Coumadin, changed toPradaxa   Hypertension    Hypothyroidism    Persistent atrial fibrillation [facility])     Patient Active Problem List   Diagnosis Date Noted   Lower extremity edema 01/22/2018   History of total knee replacement, right 10/25/2017   Osteoarthritis of left knee 10/25/2017   Persistent atrial fibrillation (HCC)    Hypertension    Hair loss    Aortic stenosis 03/08/2016   Chronic anticoagulation 12/17/2014   Mitral regurgitation 05/03/2014   Edema leg 04/28/2014   GERD (gastroesophageal reflux disease) 04/28/2014   Fatigue 12/26/2011   Ejection fraction    Normal nuclear stress test    Nausea    Brady-tachy syndrome (HCC)    Hypothyroidism    Paroxysmal atrial fibrillation [facility])    Drug therapy    Drug therapy    Pacemaker    Cough    ESSENTIAL HYPERTENSION, BENIGN 07/20/2010    Past Surgical History:  Procedure Laterality Date   ABDOMINAL HYSTERECTOMY     EP IMPLANTABLE DEVICE N/A 08/08/2015   Generator change with [employer] DR PPM by Dr Rayann Heman     JOINT REPLACEMENT     PACEMAKER PLACEMENT  2009         OB History   No obstetric history on file.     Family History  Problem Relation Age of Onset   Heart disease Father    Coronary artery disease Other    Heart disease Son     Social History   Tobacco Use   Smoking status: Former Smoker    Quit date: 09/06/1990    Years since quitting: 29.0   Smokeless tobacco: Never Used  Substance Use Topics   Alcohol use: No   Drug use: No    Home Medications Prior to Admission medications   Medication Sig Start Date End Date Taking? Authorizing Provider  acetaminophen (TYLENOL) 500 MG tablet Take 500-1,000 mg by mouth every 6 (six) hours as needed for moderate pain.   Yes [provider]  Calcium Carbonate-Vit D-Min (CALCIUM 600+D PLUS MINERALS) 600-400 MG-UNIT TABS Take 1 tablet by mouth daily.    Yes [provider]  cholecalciferol (VITAMIN D3) 25 MCG (1000 UNIT) tablet Take 1,000 Units by mouth daily.   Yes [provider]  CRANBERRY PO Take 1 capsule by mouth in the morning and at bedtime.    Yes [provider]  dabigatran (PRADAXA) 150 MG CAPS capsule Take 1 capsule (150 mg total) by mouth  every 12 (twelve) hours. 08/03/19  Yes Dorothy Spark, MD  diclofenac sodium (VOLTAREN) 1 % GEL Apply 1 application topically daily as needed (pain).    Yes [provider]  flecainide (TAMBOCOR) 50 MG tablet Take 1 tablet (50 mg total) by mouth 2 (two) times daily. Patient taking differently: Take 50 mg by mouth daily.  08/03/19  Yes Dorothy Spark, MD  fluticasone (FLONASE) 50 MCG/ACT nasal spray Place 2 sprays into both nostrils daily as needed for allergies or rhinitis.   Yes [provider]  levothyroxine (SYNTHROID) 112 MCG tablet Take 112 mcg by mouth daily before breakfast.    Yes [provider]  Menthol (HONEY LEMON COUGH DROPS MT) Use as directed 1 lozenge in the mouth or throat every 2 (two) hours as  needed (sore throat).   Yes [provider]  metolazone (ZAROXOLYN) 2.5 MG tablet Take 1 tablet (2.5 mg total) by mouth every other day. 08/03/19  Yes Dorothy Spark, MD  metoprolol succinate (TOPROL-XL) 25 MG 24 hr tablet Take 3 tablets (75 mg total) by mouth daily. 08/03/19  Yes Dorothy Spark, MD  Multiple Vitamins-Minerals (MEMORY VITE PO) Take 1 capsule by mouth daily.   Yes [provider]  Multiple Vitamins-Minerals (OCUVITE PO) Take 1 tablet by mouth daily.    Yes [provider]  vitamin B-12 (CYANOCOBALAMIN) 100 MCG tablet Take 100 mcg by mouth daily.   Yes [provider]    Allergies    Morphine and related, Penicillins, Lasix [furosemide], Aspirin, Clindamycin/lincomycin, Diltiazem, Erythromycin, Lisinopril, and Penicillins cross reactors  Review of Systems   Review of Systems  Cardiovascular: Positive for leg swelling.  Musculoskeletal: Positive for joint swelling.  Skin: Positive for color change.  All other systems reviewed and are negative.   Physical Exam Updated Vital Signs BP (!) 154/66    Pulse 61    Temp 98.4 F (36.9 C) (Oral)    Resp 18    Ht 5\' 3"  (1.6 m)    Wt 87.5 kg    SpO2 98%    BMI 34.19 kg/m   Physical Exam Vitals and nursing note reviewed.  Constitutional:      General: She is not in acute distress.    Appearance: She is well-developed. She is not ill-appearing or diaphoretic.  HENT:     Head: Normocephalic and atraumatic.     Right Ear: External ear normal.     Left Ear: External ear normal.     Nose: Nose normal.  Eyes:     General:        Right eye: No discharge.        Left eye: No discharge.  Cardiovascular:     Rate and Rhythm: Normal rate and regular rhythm.     Pulses:          Dorsalis pedis pulses are 2+ on the left side.  Pulmonary:     Effort: Pulmonary effort is normal.  Abdominal:     General: There is no distension.  Musculoskeletal:     Left foot: Swelling present. No tenderness.        Legs:     Comments: Normal gross sensation to left foot. No tenderness. No increased warmth or redness.  Some mild swelling/tenderness to distal calf  Skin:    General: Skin is warm and dry.  Neurological:     Mental Status: She is alert.  Psychiatric:        Mood and Affect:  Mood is not anxious.     ED Results / Procedures / Treatments   Labs (all labs ordered are listed, but only abnormal results are displayed) Labs Reviewed  COMPREHENSIVE METABOLIC PANEL - Abnormal; Notable for the following components:      Result Value   Potassium 3.3 (*)    Glucose, Bld 103 (*)    Alkaline Phosphatase 34 (*)    GFR calc non Af Amer 59 (*)    All other components within normal limits  CBC WITH DIFFERENTIAL/PLATELET - Abnormal; Notable for the following components:   Platelets 144 (*)    All other components within normal limits  PROTIME-INR - Abnormal; Notable for the following components:   Prothrombin Time 25.6 (*)    INR 2.4 (*)    All other components within normal limits    EKG None  Radiology DG Foot Complete Left  Result Date: 09/10/2019 CLINICAL DATA:  Swelling and ecchymosis. Bruising of toes 2 through 4. No known injury. EXAM: LEFT FOOT - COMPLETE 3+ VIEW COMPARISON:  None. FINDINGS: Hallux valgus with degenerative change at the first metatarsal phalangeal joint. Alignment is otherwise maintained. No evidence of acute fracture. No bony destruction or periosteal reaction. Small plantar calcaneal spur. Diffuse bony under mineralization. Soft tissue edema about the dorsal foot in the region of the metatarsals and digits. No soft tissue air or radiopaque foreign body. IMPRESSION: 1. Soft tissue edema without acute osseous abnormality. 2. Hallux valgus with degenerative change at the first metatarsophalangeal joint. Electronically Signed   By: Keith Rake M.D.   On: 09/10/2019 13:32   VAS [place] LOWER EXTREMITY VENOUS (DVT) ([facility] and [facility] 7a-7p)  Result Date: 09/10/2019  Lower  Venous DVTStudy Indications: Swelling, and discoloration.  Performing Technologist: June Leap RDMS, RVT  Examination Guidelines: A complete evaluation includes B-mode imaging, spectral Doppler, color Doppler, and power Doppler as needed of all accessible portions of each vessel. Bilateral testing is considered an integral part of a complete examination. Limited examinations for reoccurring indications may be performed as noted. The reflux portion of the exam is performed with the patient in reverse Trendelenburg.  +-----+---------------+---------+-----------+----------+--------------+  RIGHT Compressibility Phasicity Spontaneity Properties Thrombus Aging  +-----+---------------+---------+-----------+----------+--------------+  CFV   Full            Yes       Yes                                    +-----+---------------+---------+-----------+----------+--------------+   +---------+---------------+---------+-----------+----------+--------------+  LEFT      Compressibility Phasicity Spontaneity Properties Thrombus Aging  +---------+---------------+---------+-----------+----------+--------------+  CFV       Full            Yes       Yes                                    +---------+---------------+---------+-----------+----------+--------------+  SFJ       Full                                                             +---------+---------------+---------+-----------+----------+--------------+  FV Prox   Full                                                             +---------+---------------+---------+-----------+----------+--------------+  FV Mid    Full                                                             +---------+---------------+---------+-----------+----------+--------------+  FV Distal Full                                                             +---------+---------------+---------+-----------+----------+--------------+  PFV       Full                                                              +---------+---------------+---------+-----------+----------+--------------+  POP       Full            Yes       Yes                                    +---------+---------------+---------+-----------+----------+--------------+  PTV       Full                                                             +---------+---------------+---------+-----------+----------+--------------+  PERO      Full                                                             +---------+---------------+---------+-----------+----------+--------------+     Summary: RIGHT: - No evidence of deep vein thrombosis in the lower extremity. No indirect evidence of obstruction proximal to the inguinal ligament.  LEFT: - There is no evidence of deep vein thrombosis in the lower extremity.  - No cystic structure found in the popliteal fossa.  *See table(s) above for measurements and observations.    Preliminary     Procedures Procedures (including critical care time)  Medications Ordered in ED Medications  potassium chloride SA (KLOR-CON) CR tablet 20 mEq (has no administration in time range)    ED Course  I have reviewed the triage vital signs and the nursing notes.  Pertinent labs & imaging results that were available during my care of the patient were reviewed by me and considered in my medical decision making (see chart for details).    MDM Rules/Calculators/A&P                      Patient is neurovascularly intact.  No significant pain on exam.  DVT ultrasound is negative.  X-rays also negative.  After initial evaluation, patient found to  be on Pradaxa which is likely the cause of this.  Probably had some mild trauma she does remember but because of the blood thinner is having increased ecchymosis/swelling.  Advised ice, elevation.  Appears stable for discharge home. Final Clinical Impression(s) / ED Diagnoses Final diagnoses:  Edema of left foot    Rx / DC Orders ED Discharge Orders    None       Sherwood Gambler,  MD 09/10/19 1547

## 2019-09-10 NOTE — ED Notes (Signed)
Patient transported to Vascular lab. 

## 2019-09-10 NOTE — ED Triage Notes (Signed)
Pt arrives to ED with c/o of swelling left foot with a bruising appearance of three toes on left foot. Pt has intact pedal pulses with warmness and sensation intact. Pt denies any injury and is ambulatory, no pain.

## 2019-09-10 NOTE — Progress Notes (Signed)
Lower venous duplex       has been completed. Preliminary results can be found under CV proc through chart review. Niketa Turner, BS, RDMS, RVT   

## 2019-09-10 NOTE — Telephone Encounter (Signed)
New Message  Patient's daughter is calling in. States that patient's left foot and has spread from the top of her foot to her toes. States that patient is also having trouble walking. Please give patient a call back to assist.

## 2019-09-10 NOTE — ED Notes (Signed)
Patient c/o swelling and bruising to left foot, states she noticed her foot was darker 4 days ago.  Denies pain , strong left pedal pulse. Denies injury. States she doesn't have any problems waiting.

## 2019-09-15 NOTE — Progress Notes (Signed)
Cardiology Office Note    Date:  09/16/2019   ID:  [name], DOB [birth year], MRN [id number]  PCP:  Aretta Nip, MD  Cardiologist: Ena Dawley, MD EPS: None  Chief Complaint  Patient presents with  . Dizziness    History of Present Illness:  [name] is a 86 y.o. female with history of PAF on flecainide, pacemaker for brady-tachy syndrome,moderate AS, chronic diastolic CHF, chronic LE edema allergic to lasix on HCTZ.   Last saw Dr. Meda Coffee 04/2018 and started on metolozone every other day for worsening LE edema. Echo 07/2018 moderate AS-calculated valve area 1.18 cm-mild with normal LVEF. I saw the patient 02/2019 and she was doing well.   Patient went to the ED 09/10/19 with left foot swelling and bruising with suspected mild trauma that she didn't remember. Recommended ice and elevation. DVT ultrasound was negative, Xrays negative.  Patient comes in today accompanied by her daughter. Monday was having a sensation of feeling swirling in her head and everything going black. Would last 2-3 min and would go away for 2-3 hours and return. Denied palpitations or chest pain. Was busy that day with bell practice and playing the piano.K was 3.3 in ER and she was given replacement. She took zaroxolyn on Monday but doesn't have orthostatic dizziness.She has lost about 10 lbs.    Past Medical History:  Diagnosis Date  . Brady-tachy syndrome ([place])    a. Biotronik dual chamber PPM implanted 2009 b. gen change to STJ dual chamber PPM 2017  . Hair loss    Patient questioned Coumadin, changed toPradaxa  . Hypertension   . Hypothyroidism   . Persistent atrial fibrillation [facility])     Past Surgical History:  Procedure Laterality Date  . ABDOMINAL HYSTERECTOMY    . EP IMPLANTABLE DEVICE N/A 08/08/2015   Generator change with [facility] DR PPM by Dr Rayann Heman  . JOINT REPLACEMENT    . PACEMAKER PLACEMENT  2009        Current Medications: Current Meds  Medication Sig  .  acetaminophen (TYLENOL) 500 MG tablet Take 500-1,000 mg by mouth every 6 (six) hours as needed for moderate pain.  . Calcium Carbonate-Vit D-Min (CALCIUM 600+D PLUS MINERALS) 600-400 MG-UNIT TABS Take 1 tablet by mouth daily.   . cholecalciferol (VITAMIN D3) 25 MCG (1000 UNIT) tablet Take 1,000 Units by mouth daily.  Marland Kitchen CRANBERRY PO Take 1 capsule by mouth in the morning and at bedtime.   . dabigatran (PRADAXA) 150 MG CAPS capsule Take 1 capsule (150 mg total) by mouth every 12 (twelve) hours.  . diclofenac sodium (VOLTAREN) 1 % GEL Apply 1 application topically daily as needed (pain).   . flecainide (TAMBOCOR) 50 MG tablet Take 1 tablet (50 mg total) by mouth 2 (two) times daily.  . fluticasone (FLONASE) 50 MCG/ACT nasal spray Place 2 sprays into both nostrils daily as needed for allergies or rhinitis.  Marland Kitchen levothyroxine (SYNTHROID) 112 MCG tablet Take 112 mcg by mouth daily before breakfast.   . Menthol (HONEY LEMON COUGH DROPS MT) Use as directed 1 lozenge in the mouth or throat every 2 (two) hours as needed (sore throat).  . metoprolol succinate (TOPROL-XL) 25 MG 24 hr tablet Take 3 tablets (75 mg total) by mouth daily.  . Multiple Vitamins-Minerals (MEMORY VITE PO) Take 1 capsule by mouth daily.  . Multiple Vitamins-Minerals (OCUVITE PO) Take 1 tablet by mouth daily.   . vitamin B-12 (CYANOCOBALAMIN) 100 MCG tablet Take 100 mcg  by mouth daily.  . [DISCONTINUED] dabigatran (PRADAXA) 150 MG CAPS capsule Take 1 capsule (150 mg total) by mouth every 12 (twelve) hours.  . [DISCONTINUED] flecainide (TAMBOCOR) 50 MG tablet Take 1 tablet (50 mg total) by mouth 2 (two) times daily.  . [DISCONTINUED] metolazone (ZAROXOLYN) 2.5 MG tablet Take 1 tablet (2.5 mg total) by mouth every other day.  . [DISCONTINUED] metoprolol succinate (TOPROL-XL) 25 MG 24 hr tablet Take 3 tablets (75 mg total) by mouth daily.     Allergies:   Morphine and related, Penicillins, Lasix [furosemide], Aspirin,  Clindamycin/lincomycin, Diltiazem, Erythromycin, Lisinopril, and Penicillins cross reactors   Social History   Socioeconomic History  . Marital status: Widowed    Spouse name: Not on file  . Number of children: Not on file  . Years of education: Not on file  . Highest education level: Not on file  Occupational History  . Not on file  Tobacco Use  . Smoking status: Former Smoker    Quit date: 09/06/1990    Years since quitting: 29.0  . Smokeless tobacco: Never Used  Substance and Sexual Activity  . Alcohol use: No  . Drug use: No  . Sexual activity: Not on file  Other Topics Concern  . Not on file  Social History Narrative  . Not on file   Social Determinants of Health   Financial Resource Strain:   . Difficulty of Paying Living Expenses:   Food Insecurity:   . Worried About Charity fundraiser in the Last Year:   . Arboriculturist in the Last Year:   Transportation Needs:   . Film/video editor (Medical):   Marland Kitchen Lack of Transportation (Non-Medical):   Physical Activity:   . Days of Exercise per Week:   . Minutes of Exercise per Session:   Stress:   . Feeling of Stress :   Social Connections:   . Frequency of Communication with Friends and Family:   . Frequency of Social Gatherings with Friends and Family:   . Attends Religious Services:   . Active Member of Clubs or Organizations:   . Attends Archivist Meetings:   Marland Kitchen Marital Status:      Family History:  The patient's family history includes Coronary artery disease in an other family member; Heart disease in her father and son.   ROS:   Please see the history of present illness.    ROS All other systems reviewed and are negative.   PHYSICAL EXAM:   VS:  BP (!) 128/58   Pulse 67   Ht 5\' 3"  (1.6 m)   Wt 194 lb 1.9 oz (88.1 kg)   SpO2 93%   BMI 34.39 kg/m   Physical Exam  GEN: Well nourished, well developed, in no acute distress  Neck: no JVD, carotid bruits, or masses Cardiac:RRR; 99991111  systolic murmur LSB. Respiratory:  clear to auscultation bilaterally, normal work of breathing GI: soft, nontender, nondistended, + BS Ext: without cyanosis, clubbing, or edema, Good distal pulses bilaterally Neuro:  Alert and Oriented x 3 Psych: euthymic mood, full affect  Wt Readings from Last 3 Encounters:  09/16/19 194 lb 1.9 oz (88.1 kg)  09/10/19 193 lb (87.5 kg)  02/10/19 202 lb 1.9 oz (91.7 kg)      Studies/Labs Reviewed:   EKG:  EKG is not ordered today.    Recent Labs: 02/10/2019: TSH 2.620 09/10/2019: ALT 18; BUN 17; Creatinine, Ser 0.87; Hemoglobin 14.3; Platelets 144; Potassium 3.3; Sodium  141   Lipid Panel    Component Value Date/Time   CHOL 193 05/13/2017 1224   TRIG 152 (H) 05/13/2017 1224   HDL 41 05/13/2017 1224   CHOLHDL 4.7 (H) 05/13/2017 1224   CHOLHDL 3.9 11/21/2011 1322   VLDL 25 11/21/2011 1322   LDLCALC 122 (H) 05/13/2017 1224    Additional studies/ records that were reviewed today include:   Lower extremity dopplers 09/10/19  Summary:  RIGHT:  - No evidence of deep vein thrombosis in the lower extremity. No indirect  evidence of obstruction proximal to the inguinal ligament.    LEFT:  - There is no evidence of deep vein thrombosis in the lower extremity.    - No cystic structure found in the popliteal fossa.    *See table(s) above for measurements and observations.   Electronically signed by Monica Martinez MD on 09/10/2019 at 5:18:46 PM.        Final     Echo 07/2018   1. The left ventricle has hyperdynamic systolic function of 123XX123. The cavity size is mildly increased. There is no left ventricular wall thickness. Echo evidence of pseudonormal diastolic filling patterns. Elevated left ventricular end-diastolic  pressure.  2. There is a dynamic mid LV chamber systolic gradient that is 16mmHg at rest and increases to 58mmHg with Valsalva.  3. Mildly dilated left atrial size.  4. Normal right atrial size.  5. The mitral valve normal in  structure. There is moderate to severe mitral annular calcification present. Regurgitation is trivial by color flow Doppler.  6. Normal tricuspid valve.  7. Tricuspid regurgitation is mild.  8. The aortic valve tricuspid. There is moderate thickening and moderate calcification of the aortic valve. The calculated aortic valve area is 1.18 cm consistent with mild stenosis.  9. No atrial level shunt detected by color flow Doppler.   FINDINGS  Left Ventricle: Definity contrast agent was given IV to delineate the left ventricular endocardial borders. No evidence of left ventricular regional wall motion abnormalities. The left ventricle has hyperdynamic systolic function of 123XX123. The cavity  size is mildly increased. There is no left ventricular wall thickness. Echo evidence of pseudonormal diastolic filling patterns. Elevated left ventricular end-diastolic pressure. Right Ventricle: The right ventricle is normal in size. There is normal hypertrophy. There is normal systolic function. Right ventricular systolic pressure is normal with an estimated pressure of 30.5 mmHg. Left Atrium: The left atrium is mildly dilated. Right Atrium: The right atrial size is normal in size. Interatrial Septum: No atrial level shunt detected by color flow Doppler.   Pericardium: There is no evidence of pericardial effusion. Mitral Valve: The mitral valve normal in structure. There is moderate to severe mitral annular calcification present. Regurgitation is trivial by color flow Doppler. Tricuspid Valve: The tricuspid valve is normal in structure. Tricuspid regurgitation is mild by color flow Doppler. Aortic Valve: The aortic valve tricuspid. There is moderate thickening and moderate calcification of the aortic valve. Aortic valve regurgitation was not visualized by color flow Doppler. The calculated aortic valve area is 1.18 cm consistent with mild  stenosis. Pulmonic Valve: The pulmonic valve is normal. Pulmonic valve  regurgitation is trivial by color flow Doppler. Venous: The inferior vena cava was normal in size with greater than 50% respiratory variablity. Additional Findings: There is a dynamic mid LV chamber systolic gradient that is 50mmHg at rest and increases to 40mmHg with Valsalva.       ASSESSMENT:    1. Dizziness   2. Lower  extremity edema   3. Paroxysmal atrial fibrillation (HCC)   4. Essential hypertension   5. Aortic valve stenosis, etiology of cardiac valve disease unspecified   6. Pacemaker   7. Hypokalemia   8. Essential hypertension, benign      PLAN:  In order of problems listed above:  Dizziness with presyncope on Monday off/on described as everything turning black for 2-3 min and then would return after a couple hours. Took Zaroxolyn that am and today. Not orthostatic here. Has lost about 10 lbs. Labs 09/10/19 stable except K 3.3 and received replacement.echo 07/2019 moderate AS. Pacer check today normal function Will stop zaroxolyn and have her use lasix 40 mg only prn for edema. Check bmet, Mg, flecainide level  Leg swelling and bruising, DVT ruled out in ED. On Pradaxa and mild trauma suspected.Chronic LE edema on  metolazone every other day.   PAF on pradaxa and flecainide-renal and CBC stable  Essential HTN BP controlled  Mod AS on echo 07/2019 normal LVEF  Pacemaker for SSS followed by Dr. Rayann Heman       Medication Adjustments/Labs and Tests Ordered: Current medicines are reviewed at length with the patient today.  Concerns regarding medicines are outlined above.  Medication changes, Labs and Tests ordered today are listed in the Patient Instructions below. Patient Instructions  Medication Instructions:  Your physician has recommended you make the following change in your medication:   1. STOP: metolazone (xarolxolyn)   2. START: furosemide (lasix) 40 mg tablet: Take 1 tablet my mouth daily AS NEEDED   *If you need a refill on your cardiac medications before  your next appointment, please call your pharmacy*   Lab Work: TODAY: TSH, BMET, MG, Flecainide Level  If you have labs (blood work) drawn today and your tests are completely normal, you will receive your results only by: Marland Kitchen MyChart Message (if you have MyChart) OR . A paper copy in the mail If you have any lab test that is abnormal or we need to change your treatment, we will call you to review the results.   Testing/Procedures: None ordered   Follow-Up: Follow up with Ermalinda Barrios, PA on 11/04/19 at 10:45 AM  Other Instructions      Signed, Ermalinda Barrios, PA-C  09/16/2019 11:29 AM    [address], [place],   [address] Phone: [phone]; Fax: [phone]

## 2019-09-16 ENCOUNTER — Encounter: Payer: Self-pay | Admitting: Physician Assistant

## 2019-09-16 ENCOUNTER — Ambulatory Visit (INDEPENDENT_AMBULATORY_CARE_PROVIDER_SITE_OTHER): Payer: Medicare Other | Admitting: Physician Assistant

## 2019-09-16 ENCOUNTER — Other Ambulatory Visit: Payer: Self-pay

## 2019-09-16 ENCOUNTER — Ambulatory Visit (INDEPENDENT_AMBULATORY_CARE_PROVIDER_SITE_OTHER): Payer: Medicare Other | Admitting: *Deleted

## 2019-09-16 VITALS — BP 128/58 | HR 67 | Ht 63.0 in | Wt 194.1 lb

## 2019-09-16 DIAGNOSIS — R42 Dizziness and giddiness: Secondary | ICD-10-CM | POA: Diagnosis not present

## 2019-09-16 DIAGNOSIS — I480 Paroxysmal atrial fibrillation: Secondary | ICD-10-CM

## 2019-09-16 DIAGNOSIS — Z950 Presence of cardiac pacemaker: Secondary | ICD-10-CM

## 2019-09-16 DIAGNOSIS — I350 Nonrheumatic aortic (valve) stenosis: Secondary | ICD-10-CM

## 2019-09-16 DIAGNOSIS — I495 Sick sinus syndrome: Secondary | ICD-10-CM | POA: Diagnosis not present

## 2019-09-16 DIAGNOSIS — I10 Essential (primary) hypertension: Secondary | ICD-10-CM | POA: Diagnosis not present

## 2019-09-16 DIAGNOSIS — E876 Hypokalemia: Secondary | ICD-10-CM

## 2019-09-16 DIAGNOSIS — R600 Localized edema: Secondary | ICD-10-CM | POA: Diagnosis not present

## 2019-09-16 LAB — CUP PACEART INCLINIC DEVICE CHECK
Battery Remaining Longevity: 128 mo
Battery Voltage: 2.99 V
Brady Statistic RA Percent Paced: 97 %
Brady Statistic RV Percent Paced: 2.5 %
Date Time Interrogation Session: 20210407185157
Implantable Lead Implant Date: 20091109
Implantable Lead Implant Date: 20091109
Implantable Lead Location: 753859
Implantable Lead Location: 753860
Implantable Lead Model: 350
Implantable Lead Serial Number: 24891460
Implantable Lead Serial Number: 28411861
Implantable Pulse Generator Implant Date: 20170227
Lead Channel Impedance Value: 1600 Ohm
Lead Channel Impedance Value: 562.5 Ohm
Lead Channel Pacing Threshold Amplitude: 0.75 V
Lead Channel Pacing Threshold Amplitude: 1 V
Lead Channel Pacing Threshold Pulse Width: 0.4 ms
Lead Channel Pacing Threshold Pulse Width: 0.4 ms
Lead Channel Sensing Intrinsic Amplitude: 0.6 mV
Lead Channel Sensing Intrinsic Amplitude: 12 mV
Lead Channel Setting Pacing Amplitude: 2 V
Lead Channel Setting Pacing Amplitude: 2.5 V
Lead Channel Setting Pacing Pulse Width: 0.4 ms
Lead Channel Setting Sensing Sensitivity: 2 mV
Pulse Gen Model: 2240
Pulse Gen Serial Number: 7885781

## 2019-09-16 MED ORDER — METOPROLOL SUCCINATE ER 25 MG PO TB24: 75.0000 mg | ORAL_TABLET | Freq: Every day | ORAL | 3 refills | Status: DC

## 2019-09-16 MED ORDER — FLECAINIDE ACETATE 50 MG PO TABS: 50.0000 mg | ORAL_TABLET | Freq: Two times a day (BID) | ORAL | 3 refills | Status: DC

## 2019-09-16 MED ORDER — FUROSEMIDE 40 MG PO TABS: 40.0000 mg | ORAL_TABLET | Freq: Every day | ORAL | 0 refills | Status: DC | PRN

## 2019-09-16 MED ORDER — DABIGATRAN ETEXILATE MESYLATE 150 MG PO CAPS: 150.0000 mg | ORAL_CAPSULE | Freq: Two times a day (BID) | ORAL | 1 refills | Status: DC

## 2019-09-16 NOTE — Progress Notes (Signed)
Pacemaker check in clinic, added-on per Gerrianne Scale, PA-C, due to recent dizzy spells. Normal device function. Thresholds, RV sensing, and impedances consistent with previous measurements. P-waves measure 0.24mV today, previously ranged 1.5-2.75mV, AP 97%, auto sensitivity on with floor of 0.24mV. Device programmed to maximize longevity. <1% AT/AF burden, longest 1hr 82min, on Pradaxa and flecainide, No high ventricular rates noted. Device programmed at appropriate safety margins. Histogram distribution appropriate for patient activity level. Device programmed to optimize intrinsic conduction. No changes today. Estimated longevity 10.7 years. Patient enrolled in remote follow-up. Patient education completed. Merlin on 10/16/19. Message sent to scheduler as patient is overdue for f/u with Dr. Rayann Heman or EP APP.

## 2019-09-16 NOTE — Patient Instructions (Signed)
Medication Instructions:  Your physician has recommended you make the following change in your medication:   1. STOP: metolazone (xarolxolyn)   2. START: furosemide (lasix) 40 mg tablet: Take 1 tablet my mouth daily AS NEEDED   *If you need a refill on your cardiac medications before your next appointment, please call your pharmacy*   Lab Work: TODAY: TSH, BMET, MG, Flecainide Level  If you have labs (blood work) drawn today and your tests are completely normal, you will receive your results only by: Marland Kitchen MyChart Message (if you have MyChart) OR . A paper copy in the mail If you have any lab test that is abnormal or we need to change your treatment, we will call you to review the results.   Testing/Procedures: None ordered   Follow-Up: Follow up with Ermalinda Barrios, PA on 11/04/19 at 10:45 AM  Other Instructions

## 2019-09-17 ENCOUNTER — Telehealth: Payer: Self-pay | Admitting: Nurse Practitioner

## 2019-09-17 MED ORDER — FUROSEMIDE 40 MG PO TABS: 40.0000 mg | ORAL_TABLET | Freq: Every day | ORAL | 0 refills | Status: DC

## 2019-09-17 MED ORDER — POTASSIUM CHLORIDE CRYS ER 10 MEQ PO TBCR: EXTENDED_RELEASE_TABLET | ORAL | 11 refills | Status: DC

## 2019-09-17 NOTE — Telephone Encounter (Signed)
-----   Message from Imogene Burn, PA-C sent at 09/17/2019  8:13 AM EDT ----- Kdur 20 meq-take 2 tablets today and 2 tomorrow. Then only take 1 tablet when she takes Lasix as needed. Magnesium and thyroid normal. Flecainide level not back yet

## 2019-09-17 NOTE — Telephone Encounter (Signed)
Reviewed lab results with patient. I explained how to take the [place] because she asked for the 10 mEq tablets due to their smaller size and easier to swallow. I asked her to write down the instructions and reviewed them slowly with her. States she will not get furosemide from her mail order pharmacy for another week. I advised I will send furosemide 40 mg #10 to [facility]. I explained when to take the furosemide and asked her to make certain she has removed the metolazone from her regular medications. She verbalized understanding and agreement back to me of all instructions. I advised her to call back in the future with any questions. She thanked me for the call.

## 2019-09-22 LAB — BASIC METABOLIC PANEL
BUN/Creatinine Ratio: 20 (ref 12–28)
BUN: 16 mg/dL (ref 10–36)
CO2: 28 mmol/L (ref 20–29)
Calcium: 9.3 mg/dL (ref 8.7–10.3)
Chloride: 100 mmol/L (ref 96–106)
Creatinine, Ser: 0.81 mg/dL (ref 0.57–1.00)
GFR calc Af Amer: 74 mL/min/{1.73_m2} (ref >59)
GFR calc non Af Amer: 64 mL/min/{1.73_m2} (ref >59)
Glucose: 110 mg/dL — ABNORMAL HIGH (ref 65–99)
Potassium: 3.4 mmol/L — ABNORMAL LOW (ref 3.5–5.2)
Sodium: 143 mmol/L (ref 134–144)

## 2019-09-22 LAB — FLECAINIDE LEVEL: Flecainide: 0.28 ug/mL (ref 0.20–1.00)

## 2019-09-22 LAB — MAGNESIUM: Magnesium: 1.8 mg/dL (ref 1.6–2.3)

## 2019-09-22 LAB — TSH: TSH: 2.24 u[IU]/mL (ref 0.450–4.500)

## 2019-09-24 ENCOUNTER — Encounter: Payer: Self-pay | Admitting: Internal Medicine

## 2019-10-07 ENCOUNTER — Other Ambulatory Visit: Payer: Self-pay

## 2019-10-07 ENCOUNTER — Ambulatory Visit (INDEPENDENT_AMBULATORY_CARE_PROVIDER_SITE_OTHER): Payer: Medicare Other | Admitting: Student

## 2019-10-07 ENCOUNTER — Encounter: Payer: Self-pay | Admitting: Student

## 2019-10-07 VITALS — BP 118/60 | HR 60 | Ht 63.0 in | Wt 195.0 lb

## 2019-10-07 DIAGNOSIS — R600 Localized edema: Secondary | ICD-10-CM | POA: Diagnosis not present

## 2019-10-07 DIAGNOSIS — I480 Paroxysmal atrial fibrillation: Secondary | ICD-10-CM

## 2019-10-07 DIAGNOSIS — I10 Essential (primary) hypertension: Secondary | ICD-10-CM | POA: Diagnosis not present

## 2019-10-07 DIAGNOSIS — I495 Sick sinus syndrome: Secondary | ICD-10-CM | POA: Diagnosis not present

## 2019-10-07 LAB — CUP PACEART INCLINIC DEVICE CHECK
Battery Remaining Longevity: 127 mo
Battery Voltage: 2.99 V
Brady Statistic RA Percent Paced: 99 %
Brady Statistic RV Percent Paced: 0.48 %
Date Time Interrogation Session: 20210428100748
Implantable Lead Implant Date: 20091109
Implantable Lead Implant Date: 20091109
Implantable Lead Location: 753859
Implantable Lead Location: 753860
Implantable Lead Model: 350
Implantable Lead Serial Number: 24891460
Implantable Lead Serial Number: 28411861
Implantable Pulse Generator Implant Date: 20170227
Lead Channel Impedance Value: 1575 Ohm
Lead Channel Impedance Value: 512.5 Ohm
Lead Channel Pacing Threshold Amplitude: 0.75 V
Lead Channel Pacing Threshold Amplitude: 0.75 V
Lead Channel Pacing Threshold Amplitude: 1 V
Lead Channel Pacing Threshold Amplitude: 1 V
Lead Channel Pacing Threshold Pulse Width: 0.4 ms
Lead Channel Pacing Threshold Pulse Width: 0.4 ms
Lead Channel Pacing Threshold Pulse Width: 0.4 ms
Lead Channel Pacing Threshold Pulse Width: 0.4 ms
Lead Channel Sensing Intrinsic Amplitude: 0.7 mV
Lead Channel Sensing Intrinsic Amplitude: 12 mV
Lead Channel Setting Pacing Amplitude: 2 V
Lead Channel Setting Pacing Amplitude: 2.5 V
Lead Channel Setting Pacing Pulse Width: 0.4 ms
Lead Channel Setting Sensing Sensitivity: 2 mV
Pulse Gen Model: 2240
Pulse Gen Serial Number: 7885781

## 2019-10-07 NOTE — Patient Instructions (Addendum)
Medication Instructions:  none *If you need a refill on your cardiac medications before your next appointment, please call your pharmacy*   Lab Work: none If you have labs (blood work) drawn today and your tests are completely normal, you will receive your results only by: Marland Kitchen MyChart Message (if you have MyChart) OR . A paper copy in the mail If you have any lab test that is abnormal or we need to change your treatment, we will call you to review the results.   Testing/Procedures: none   Follow-Up: At [facility], you and your health needs are our priority.  As part of our continuing mission to provide you with exceptional heart care, we have created designated Provider Care Teams.  These Care Teams include your primary Cardiologist (physician) and Advanced Practice Providers (APPs -  Physician Assistants and Nurse Practitioners) who all work together to provide you with the care you need, when you need it.  We recommend signing up for the patient portal called "MyChart".  Sign up information is provided on this After Visit Summary.  MyChart is used to connect with patients for Virtual Visits (Telemedicine).  Patients are able to view lab/test results, encounter notes, upcoming appointments, etc.  Non-urgent messages can be sent to your provider as well.   To learn more about what you can do with MyChart, go to [email or web address].    Your next appointment:   1 year(s)  The format for your next appointment:   Either In Person or Virtual  Provider:   Dr Rayann Heman   Other Instructions Remote monitoring is used to monitor your Pacemaker  from home. This monitoring reduces the number of office visits required to check your device to one time per year. It allows [place] to keep an eye on the functioning of your device to ensure it is working properly. You are scheduled for a device check from home on 10/16/19. You may send your transmission at any time that day. If you have a wireless  device, the transmission will be sent automatically. After your physician reviews your transmission, you will receive a postcard with your next transmission date.

## 2019-10-07 NOTE — Progress Notes (Addendum)
Electrophysiology Office Note Date: 10/07/2019  ID:  [name], DOB May 12, 1928, MRN [id number]  PCP: Aretta Nip, MD Primary Cardiologist: Ena Dawley, MD Electrophysiologist: Thompson Grayer, MD   CC: Pacemaker follow-up  [name] is a 86 y.o. female seen today for Thompson Grayer, MD for routine electrophysiology followup.  Since last being seen in our clinic the patient reports doing well. Recently had adjustments to her diuretics, and has balanced out well. No further orthostatic type symptoms, and fluid well managed. She has not needed any extra lasix.  she denies chest pain, palpitations, dyspnea, PND, orthopnea, nausea, vomiting, dizziness, syncope, edema, weight gain, or early satiety.  Device History: [employer] Jude Dual Chamber PPM implanted 2009, gen change 2017 for tachy-brady syndrome  Past Medical History:  Diagnosis Date  . Brady-tachy syndrome ([place])    a. Biotronik dual chamber PPM implanted 2009 b. gen change to STJ dual chamber PPM 2017  . Hair loss    Patient questioned Coumadin, changed toPradaxa  . Hypertension   . Hypothyroidism   . Persistent atrial fibrillation [facility])    Past Surgical History:  Procedure Laterality Date  . ABDOMINAL HYSTERECTOMY    . EP IMPLANTABLE DEVICE N/A 08/08/2015   Generator change with [employer] Assurity DR PPM by Dr Rayann Heman  . JOINT REPLACEMENT    . PACEMAKER PLACEMENT  2009        Current Outpatient Medications  Medication Sig Dispense Refill  . acetaminophen (TYLENOL) 500 MG tablet Take 500-1,000 mg by mouth every 6 (six) hours as needed for moderate pain.    . Calcium Carbonate-Vit D-Min (CALCIUM 600+D PLUS MINERALS) 600-400 MG-UNIT TABS Take 1 tablet by mouth daily.     . cholecalciferol (VITAMIN D3) 25 MCG (1000 UNIT) tablet Take 1,000 Units by mouth daily.    Marland Kitchen CRANBERRY PO Take 1 capsule by mouth in the morning and at bedtime.     . dabigatran (PRADAXA) 150 MG CAPS capsule Take 1 capsule (150 mg total) by mouth every  12 (twelve) hours. 180 capsule 1  . diclofenac sodium (VOLTAREN) 1 % GEL Apply 1 application topically daily as needed (pain).     . flecainide (TAMBOCOR) 50 MG tablet Take 1 tablet (50 mg total) by mouth 2 (two) times daily. 180 tablet 3  . fluticasone (FLONASE) 50 MCG/ACT nasal spray Place 2 sprays into both nostrils daily as needed for allergies or rhinitis.    . furosemide (LASIX) 40 MG tablet Take 1 tablet (40 mg total) by mouth daily as needed for edema. 90 tablet 0  . levothyroxine (SYNTHROID) 112 MCG tablet Take 112 mcg by mouth daily before breakfast.     . Menthol (HONEY LEMON COUGH DROPS MT) Use as directed 1 lozenge in the mouth or throat every 2 (two) hours as needed (sore throat).    . metoprolol succinate (TOPROL-XL) 25 MG 24 hr tablet Take 3 tablets (75 mg total) by mouth daily. 270 tablet 3  . Multiple Vitamins-Minerals (MEMORY VITE PO) Take 1 capsule by mouth daily.    . Multiple Vitamins-Minerals (OCUVITE PO) Take 1 tablet by mouth daily.     . potassium chloride (KLOR-CON) 10 MEQ tablet Take 4 pills on day 1 and 2 then take 2 pills daily on days that you take furosemide (lasix) 60 tablet 11  . vitamin B-12 (CYANOCOBALAMIN) 100 MCG tablet Take 100 mcg by mouth daily.     No current facility-administered medications for this visit.    Allergies:  Morphine and related, Penicillins, Lasix [furosemide], Aspirin, Clindamycin/lincomycin, Diltiazem, Erythromycin, Lisinopril, and Penicillins cross reactors   Social History: Social History   Socioeconomic History  . Marital status: Widowed    Spouse name: Not on file  . Number of children: Not on file  . Years of education: Not on file  . Highest education level: Not on file  Occupational History  . Not on file  Tobacco Use  . Smoking status: Former Smoker    Quit date: 09/06/1990    Years since quitting: 29.1  . Smokeless tobacco: Never Used  Substance and Sexual Activity  . Alcohol use: No  . Drug use: No  . Sexual  activity: Not on file  Other Topics Concern  . Not on file  Social History Narrative  . Not on file   Social Determinants of Health   Financial Resource Strain:   . Difficulty of Paying Living Expenses:   Food Insecurity:   . Worried About Charity fundraiser in the Last Year:   . Arboriculturist in the Last Year:   Transportation Needs:   . Film/video editor (Medical):   Marland Kitchen Lack of Transportation (Non-Medical):   Physical Activity:   . Days of Exercise per Week:   . Minutes of Exercise per Session:   Stress:   . Feeling of Stress :   Social Connections:   . Frequency of Communication with Friends and Family:   . Frequency of Social Gatherings with Friends and Family:   . Attends Religious Services:   . Active Member of Clubs or Organizations:   . Attends Archivist Meetings:   Marland Kitchen Marital Status:   Intimate Partner Violence:   . Fear of Current or Ex-Partner:   . Emotionally Abused:   Marland Kitchen Physically Abused:   . Sexually Abused:     Family History: Family History  Problem Relation Age of Onset  . Heart disease Father   . Coronary artery disease Other   . Heart disease Son      Review of Systems: All other systems reviewed and are otherwise negative except as noted above.  Physical Exam: Vitals:   10/07/19 0949  BP: 118/60  Pulse: 60  SpO2: 96%  Weight: 195 lb (88.5 kg)  Height: 5\' 3"  (1.6 m)     GEN- The patient is well appearing, alert and oriented x 3 today.   HEENT: normocephalic, atraumatic; sclera clear, conjunctiva pink; hearing intact; oropharynx clear; neck supple  Lungs- Clear to ausculation bilaterally, normal work of breathing.  No wheezes, rales, rhonchi Heart- Regular rate and rhythm, no murmurs, rubs or gallops  GI- soft, non-tender, non-distended, bowel sounds present  Extremities- no clubbing, cyanosis, or edema  MS- no significant deformity or atrophy Skin- warm and dry, no rash or lesion; PPM pocket well healed Psych-  euthymic mood, full affect Neuro- strength and sensation are intact  PPM Interrogation- reviewed in detail today,  See PACEART report  EKG:  EKG is ordered today. NSR (A paced by device interrogation but difficult to appreciate pacer spikes on EKG) at 60 bpm.   Recent Labs: 09/10/2019: ALT 18; Hemoglobin 14.3; Platelets 144 09/16/2019: BUN 16; Creatinine, Ser 0.81; Magnesium 1.8; Potassium 3.4; Sodium 143; TSH 2.240   Wt Readings from Last 3 Encounters:  10/07/19 195 lb (88.5 kg)  09/16/19 194 lb 1.9 oz (88.1 kg)  09/10/19 193 lb (87.5 kg)     Other studies Reviewed: Additional studies/ records that were reviewed today  include: Previous EP office notes, Previous remote checks, Most recent labwork.   Assessment and Plan:  1. Symptomatic bradycardia s/p St. Jude PPM  Normal PPM function See Pace Art report No changes today  2. Atrial fibrillation, paroxysmal Well controlled on flecainide (dose lowered with 1st degree block) Burden <1%, longest recent episode <40 seconds  3. HTN Continue current medication  4. Venous insufficiency Stable Recommended compression hose Recently taken off metolazone with presumed orthostasis.  Current medicines are reviewed at length with the patient today.   The patient does not have concerns regarding her medicines.  The following changes were made today:  none  Labs/ tests ordered today include:  Orders Placed This Encounter  Procedures  . CUP PACEART [place]  . EKG 12-Lead    Disposition:   Follow up with EP APP in 12 Months    Signed, Annamaria Helling  10/07/2019 10:37 AM  [facility] [address] [place] [address] [phone] (office) [phone] (fax)

## 2019-10-16 ENCOUNTER — Ambulatory Visit (INDEPENDENT_AMBULATORY_CARE_PROVIDER_SITE_OTHER): Payer: Medicare Other | Admitting: *Deleted

## 2019-10-16 DIAGNOSIS — I495 Sick sinus syndrome: Secondary | ICD-10-CM

## 2019-10-16 NOTE — Progress Notes (Signed)
Remote pacemaker transmission.   

## 2019-10-19 LAB — CUP PACEART REMOTE DEVICE CHECK
Battery Remaining Longevity: 126 mo
Battery Remaining Percentage: 95.5 %
Battery Voltage: 2.99 V
Brady Statistic AP VP Percent: 1 %
Brady Statistic AP VS Percent: 99 %
Brady Statistic AS VP Percent: 1 %
Brady Statistic AS VS Percent: 1 %
Brady Statistic RA Percent Paced: 99 %
Brady Statistic RV Percent Paced: 1 %
Date Time Interrogation Session: 20210507040012
Implantable Lead Implant Date: 20091109
Implantable Lead Implant Date: 20091109
Implantable Lead Location: 753859
Implantable Lead Location: 753860
Implantable Lead Model: 350
Implantable Lead Serial Number: 24891460
Implantable Lead Serial Number: 28411861
Implantable Pulse Generator Implant Date: 20170227
Lead Channel Impedance Value: 1575 Ohm
Lead Channel Impedance Value: 460 Ohm
Lead Channel Pacing Threshold Amplitude: 0.75 V
Lead Channel Pacing Threshold Amplitude: 1 V
Lead Channel Pacing Threshold Pulse Width: 0.4 ms
Lead Channel Pacing Threshold Pulse Width: 0.4 ms
Lead Channel Sensing Intrinsic Amplitude: 1.1 mV
Lead Channel Sensing Intrinsic Amplitude: 12 mV
Lead Channel Setting Pacing Amplitude: 2 V
Lead Channel Setting Pacing Amplitude: 2.5 V
Lead Channel Setting Pacing Pulse Width: 0.4 ms
Lead Channel Setting Sensing Sensitivity: 2 mV
Pulse Gen Model: 2240
Pulse Gen Serial Number: 7885781

## 2019-10-20 DIAGNOSIS — Z9849 Cataract extraction status, unspecified eye: Secondary | ICD-10-CM | POA: Diagnosis not present

## 2019-10-20 DIAGNOSIS — Z961 Presence of intraocular lens: Secondary | ICD-10-CM | POA: Diagnosis not present

## 2019-10-20 DIAGNOSIS — H52221 Regular astigmatism, right eye: Secondary | ICD-10-CM | POA: Diagnosis not present

## 2019-10-20 DIAGNOSIS — H524 Presbyopia: Secondary | ICD-10-CM | POA: Diagnosis not present

## 2019-10-20 DIAGNOSIS — H18513 Endothelial corneal dystrophy, bilateral: Secondary | ICD-10-CM | POA: Diagnosis not present

## 2019-10-20 DIAGNOSIS — H5203 Hypermetropia, bilateral: Secondary | ICD-10-CM | POA: Diagnosis not present

## 2019-10-28 NOTE — Progress Notes (Signed)
Cardiology Office Note    Date:  11/04/2019   ID:  KHOU TUREAUD, DOB 06/11/1928, MRN [id number]  PCP:  Aretta Nip, MD  Cardiologist: Ena Dawley, MD EPS: Thompson Grayer, MD  Chief Complaint  Patient presents with  . Follow-up    History of Present Illness:  [name] is a 86 y.o. female with history of paroxysmal atrial fibrillation on flecainide and Pradaxa, bradycardia tachy syndrome status post pacemaker in 2009 with generator change to [employer] [facility] dual-chamber 2017, hypertension, hypothyroidism, chronic diastolic CHF. normal device function 10/16/2019  I saw the patient 09/16/2019 when she was having dizziness and presyncope after taking Zaroxolyn for edema.  She had lost 10 pounds.  She was not orthostatic in our office that day.  I stopped her Zaroxolyn and asked her to use Lasix 40 mg as needed for edema.  K was 3.4 creatinine 0.81. Saw EP and PAF well controlled on flecainide.  Patient comes in accompanied by her daughter. She's been taking lasix 40 daily instead of prn.No further dizziness, shortness of breath, edema.  She was diagnosed with a condition and told she would eventually go blind.  Past Medical History:  Diagnosis Date  . Aortic stenosis 03/08/2016  . Brady-tachy syndrome ([place])    a. Biotronik dual chamber PPM implanted 2009 b. gen change to STJ dual chamber PPM 2017  . Ejection fraction    EF 70%, echo, October, 2009  //   EF 55-60%, septal dyssynergy consistent with a paced rhythm, mild to moderate mitral regurgitation, echo, November, 2015   . GERD (gastroesophageal reflux disease) 04/28/2014   Episodes November, 2015 with fluid refluxing from her esophagus.   . Hair loss    Patient questioned Coumadin, changed toPradaxa  . Hypertension   . Hypothyroidism   . Lower extremity edema 01/22/2018  . Mitral regurgitation 05/03/2014   Mild-to-moderate mitral regurgitation, echo, November, 2015   . Osteoarthritis of left knee 10/25/2017  . Paroxysmal  atrial fibrillation (HCC)    Episodes rapid atrial fib noted by pacemaker interrogation, August, 2011, diltiazem added, patient improved. Patient continues on Rythmol  //   Changed to flecainide 2013  //   flecainide level checked in 2013, good level   . Persistent atrial fibrillation [facility])     Past Surgical History:  Procedure Laterality Date  . ABDOMINAL HYSTERECTOMY    . EP IMPLANTABLE DEVICE N/A 08/08/2015   Generator change with SJM Assurity DR PPM by Dr Rayann Heman  . JOINT REPLACEMENT    . PACEMAKER PLACEMENT  2009        Current Medications: Current Meds  Medication Sig  . acetaminophen (TYLENOL) 500 MG tablet Take 500-1,000 mg by mouth every 6 (six) hours as needed for moderate pain.  . Calcium Carbonate-Vit D-Min (CALCIUM 600+D PLUS MINERALS) 600-400 MG-UNIT TABS Take 1 tablet by mouth daily.   . cholecalciferol (VITAMIN D3) 25 MCG (1000 UNIT) tablet Take 1,000 Units by mouth daily.  Marland Kitchen CRANBERRY PO Take 1 capsule by mouth in the morning and at bedtime.   . dabigatran (PRADAXA) 150 MG CAPS capsule Take 1 capsule (150 mg total) by mouth every 12 (twelve) hours.  . diclofenac sodium (VOLTAREN) 1 % GEL Apply 1 application topically daily as needed (pain).   . flecainide (TAMBOCOR) 50 MG tablet Take 1 tablet (50 mg total) by mouth 2 (two) times daily.  . fluticasone (FLONASE) 50 MCG/ACT nasal spray Place 2 sprays into both nostrils daily as needed for allergies or  rhinitis.  . furosemide (LASIX) 20 MG tablet Take 1 tablet (20 mg total) by mouth daily.  Marland Kitchen levothyroxine (SYNTHROID) 112 MCG tablet Take 112 mcg by mouth daily before breakfast.   . Menthol (HONEY LEMON COUGH DROPS MT) Use as directed 1 lozenge in the mouth or throat every 2 (two) hours as needed (sore throat).  . metoprolol succinate (TOPROL-XL) 25 MG 24 hr tablet Take 3 tablets (75 mg total) by mouth daily.  . Multiple Vitamins-Minerals (MEMORY VITE PO) Take 1 capsule by mouth daily.  . Multiple Vitamins-Minerals (OCUVITE  PO) Take 1 tablet by mouth daily.   . potassium chloride (KLOR-CON) 10 MEQ tablet Take 1 tablet (10 mEq total) by mouth daily.  . vitamin B-12 (CYANOCOBALAMIN) 100 MCG tablet Take 100 mcg by mouth daily.  . [DISCONTINUED] furosemide (LASIX) 40 MG tablet Take 1 tablet (40 mg total) by mouth daily as needed for edema.  . [DISCONTINUED] potassium chloride (KLOR-CON) 10 MEQ tablet Take 4 pills on day 1 and 2 then take 2 pills daily on days that you take furosemide (lasix)     Allergies:   Morphine and related, Penicillins, Lasix [furosemide], Aspirin, Clindamycin/lincomycin, Diltiazem, Erythromycin, Lisinopril, and Penicillins cross reactors   Social History   Socioeconomic History  . Marital status: Widowed    Spouse name: Not on file  . Number of children: Not on file  . Years of education: Not on file  . Highest education level: Not on file  Occupational History  . Not on file  Tobacco Use  . Smoking status: Former Smoker    Quit date: 09/06/1990    Years since quitting: 29.1  . Smokeless tobacco: Never Used  Substance and Sexual Activity  . Alcohol use: No  . Drug use: No  . Sexual activity: Not on file  Other Topics Concern  . Not on file  Social History Narrative  . Not on file   Social Determinants of Health   Financial Resource Strain:   . Difficulty of Paying Living Expenses:   Food Insecurity:   . Worried About Charity fundraiser in the Last Year:   . Arboriculturist in the Last Year:   Transportation Needs:   . Film/video editor (Medical):   Marland Kitchen Lack of Transportation (Non-Medical):   Physical Activity:   . Days of Exercise per Week:   . Minutes of Exercise per Session:   Stress:   . Feeling of Stress :   Social Connections:   . Frequency of Communication with Friends and Family:   . Frequency of Social Gatherings with Friends and Family:   . Attends Religious Services:   . Active Member of Clubs or Organizations:   . Attends Archivist  Meetings:   Marland Kitchen Marital Status:      Family History:  The patient's family history includes Coronary artery disease in an other family member; Heart disease in her father and son.   ROS:   Please see the history of present illness.    ROS All other systems reviewed and are negative.   PHYSICAL EXAM:   VS:  BP (!) 148/64   Pulse 81   Ht 5\' 3"  (1.6 m)   Wt 195 lb (88.5 kg)   SpO2 92%   BMI 34.54 kg/m   Physical Exam  GEN: Well nourished, well developed, in no acute distress  Neck: no JVD, carotid bruits, or masses Cardiac:RRR; no murmurs, rubs, or gallops  Respiratory:  clear to  auscultation bilaterally, normal work of breathing GI: soft, nontender, nondistended, + BS Ext: Wearing compression stockings without cyanosis, clubbing, or edema, Good distal pulses bilaterally Neuro:  Alert and Oriented x 3,  Psych: euthymic mood, full affect  Wt Readings from Last 3 Encounters:  11/04/19 195 lb (88.5 kg)  10/07/19 195 lb (88.5 kg)  09/16/19 194 lb 1.9 oz (88.1 kg)      Studies/Labs Reviewed:   EKG:  EKG is not ordered today.   Recent Labs: 09/10/2019: ALT 18; Hemoglobin 14.3; Platelets 144 09/16/2019: BUN 16; Creatinine, Ser 0.81; Magnesium 1.8; Potassium 3.4; Sodium 143; TSH 2.240   Lipid Panel    Component Value Date/Time   CHOL 193 05/13/2017 1224   TRIG 152 (H) 05/13/2017 1224   HDL 41 05/13/2017 1224   CHOLHDL 4.7 (H) 05/13/2017 1224   CHOLHDL 3.9 11/21/2011 1322   VLDL 25 11/21/2011 1322   LDLCALC 122 (H) 05/13/2017 1224    Additional studies/ records that were reviewed today include:  Lower extremity dopplers 09/10/19  Summary:  RIGHT:  - No evidence of deep vein thrombosis in the lower extremity. No indirect  evidence of obstruction proximal to the inguinal ligament.    LEFT:  - There is no evidence of deep vein thrombosis in the lower extremity.    - No cystic structure found in the popliteal fossa.    *See table(s) above for measurements and observations.    Electronically signed by Monica Martinez MD on 09/10/2019 at 5:18:46 PM.        Final      Echo 07/2018   1. The left ventricle has hyperdynamic systolic function of 123XX123. The cavity size is mildly increased. There is no left ventricular wall thickness. Echo evidence of pseudonormal diastolic filling patterns. Elevated left ventricular end-diastolic  pressure.  2. There is a dynamic mid LV chamber systolic gradient that is 60mmHg at rest and increases to 101mmHg with Valsalva.  3. Mildly dilated left atrial size.  4. Normal right atrial size.  5. The mitral valve normal in structure. There is moderate to severe mitral annular calcification present. Regurgitation is trivial by color flow Doppler.  6. Normal tricuspid valve.  7. Tricuspid regurgitation is mild.  8. The aortic valve tricuspid. There is moderate thickening and moderate calcification of the aortic valve. The calculated aortic valve area is 1.18 cm consistent with mild stenosis.  9. No atrial level shunt detected by color flow Doppler.   FINDINGS  Left Ventricle: Definity contrast agent was given IV to delineate the left ventricular endocardial borders. No evidence of left ventricular regional wall motion abnormalities. The left ventricle has hyperdynamic systolic function of 123XX123. The cavity  size is mildly increased. There is no left ventricular wall thickness. Echo evidence of pseudonormal diastolic filling patterns. Elevated left ventricular end-diastolic pressure. Right Ventricle: The right ventricle is normal in size. There is normal hypertrophy. There is normal systolic function. Right ventricular systolic pressure is normal with an estimated pressure of 30.5 mmHg. Left Atrium: The left atrium is mildly dilated. Right Atrium: The right atrial size is normal in size. Interatrial Septum: No atrial level shunt detected by color flow Doppler.   Pericardium: There is no evidence of pericardial effusion. Mitral Valve: The  mitral valve normal in structure. There is moderate to severe mitral annular calcification present. Regurgitation is trivial by color flow Doppler. Tricuspid Valve: The tricuspid valve is normal in structure. Tricuspid regurgitation is mild by color flow Doppler. Aortic Valve: The aortic  valve tricuspid. There is moderate thickening and moderate calcification of the aortic valve. Aortic valve regurgitation was not visualized by color flow Doppler. The calculated aortic valve area is 1.18 cm consistent with mild  stenosis. Pulmonic Valve: The pulmonic valve is normal. Pulmonic valve regurgitation is trivial by color flow Doppler. Venous: The inferior vena cava was normal in size with greater than 50% respiratory variablity. Additional Findings: There is a dynamic mid LV chamber systolic gradient that is 107mmHg at rest and increases to 76mmHg with Valsalva.             ASSESSMENT:    1. Dizziness   2. Chronic diastolic CHF (congestive heart failure) (HCC)   3. Paroxysmal atrial fibrillation ([place])   4. Essential hypertension   5. Moderate aortic stenosis   6. Pacemaker      PLAN:  In order of problems listed above:  Dizziness with presyncope after taking Zaroxolyn and 10 pound weight loss resolved was to be on Lasix as needed for edema but patient has been taking 40 mg daily.  Weight is stable at 195 pounds.  Will reduce Lasix to 20 mg once daily potassium 10 mEq once daily, follow-up labs today.  Can take extra Lasix and potassium if needed for weight gain of 2 or 3 pounds overnight or swelling.  Chronic diastolic CHF and leg edema see above  PAF on Pradaxa and flecainide renal and CBC have been stable  Essential hypertension blood pressure up a little bit today but it has been stable.  No changes other than above  Moderate aortic stenosis with normal LV function on echo  Pacemaker for sick sinus syndrome followed by Dr. Rayann Heman     Medication Adjustments/Labs and Tests  Ordered: Current medicines are reviewed at length with the patient today.  Concerns regarding medicines are outlined above.  Medication changes, Labs and Tests ordered today are listed in the Patient Instructions below. Patient Instructions  Medication Instructions:  Your physician has recommended you make the following change in your medication:   1. CHANGE: furosemide (lasix) to 20 mg once a day. You may take an extra 20 mg AS NEEDED for more than 3 lb weight gain in 24 hours or more than 5 lbs in 1 week  2. CHANGE: potassium chloride (k-dur) to 10 mEq once a day  *If you need a refill on your cardiac medications before your next appointment, please call your pharmacy*   Lab Work: TODAY: BMET  If you have labs (blood work) drawn today and your tests are completely normal, you will receive your results only by: Marland Kitchen MyChart Message (if you have MyChart) OR . A paper copy in the mail If you have any lab test that is abnormal or we need to change your treatment, we will call you to review the results.   Testing/Procedures: None ordered   Follow-Up: Follow up with Ermalinda Barrios, PA on 12/09/19 at 10:45 AM  Other Instructions None     Signed, Ermalinda Barrios, PA-C  11/04/2019 1:15 PM    [facility] [place], [place], [place]  [address] Phone: [phone]; Fax: [phone]

## 2019-11-04 ENCOUNTER — Encounter: Payer: Self-pay | Admitting: Physician Assistant

## 2019-11-04 ENCOUNTER — Ambulatory Visit (INDEPENDENT_AMBULATORY_CARE_PROVIDER_SITE_OTHER): Payer: Medicare Other | Admitting: Physician Assistant

## 2019-11-04 ENCOUNTER — Other Ambulatory Visit: Payer: Self-pay

## 2019-11-04 VITALS — BP 148/64 | HR 81 | Ht 63.0 in | Wt 195.0 lb

## 2019-11-04 DIAGNOSIS — I5032 Chronic diastolic (congestive) heart failure: Secondary | ICD-10-CM

## 2019-11-04 DIAGNOSIS — I10 Essential (primary) hypertension: Secondary | ICD-10-CM

## 2019-11-04 DIAGNOSIS — I350 Nonrheumatic aortic (valve) stenosis: Secondary | ICD-10-CM

## 2019-11-04 DIAGNOSIS — Z950 Presence of cardiac pacemaker: Secondary | ICD-10-CM

## 2019-11-04 DIAGNOSIS — I480 Paroxysmal atrial fibrillation: Secondary | ICD-10-CM | POA: Diagnosis not present

## 2019-11-04 DIAGNOSIS — R42 Dizziness and giddiness: Secondary | ICD-10-CM

## 2019-11-04 MED ORDER — FUROSEMIDE 20 MG PO TABS: 20.0000 mg | ORAL_TABLET | Freq: Every day | ORAL | 3 refills | Status: DC

## 2019-11-04 MED ORDER — POTASSIUM CHLORIDE CRYS ER 10 MEQ PO TBCR: 10.0000 meq | EXTENDED_RELEASE_TABLET | Freq: Every day | ORAL | 11 refills | Status: DC

## 2019-11-04 NOTE — Patient Instructions (Signed)
Medication Instructions:  Your physician has recommended you make the following change in your medication:   1. CHANGE: furosemide (lasix) to 20 mg once a day. You may take an extra 20 mg AS NEEDED for more than 3 lb weight gain in 24 hours or more than 5 lbs in 1 week  2. CHANGE: potassium chloride (k-dur) to 10 mEq once a day  *If you need a refill on your cardiac medications before your next appointment, please call your pharmacy*   Lab Work: TODAY: BMET  If you have labs (blood work) drawn today and your tests are completely normal, you will receive your results only by: Marland Kitchen MyChart Message (if you have MyChart) OR . A paper copy in the mail If you have any lab test that is abnormal or we need to change your treatment, we will call you to review the results.   Testing/Procedures: None ordered   Follow-Up: Follow up with Ermalinda Barrios, PA on 12/09/19 at 10:45 AM  Other Instructions None

## 2019-11-05 LAB — BASIC METABOLIC PANEL
BUN/Creatinine Ratio: 18 (ref 12–28)
BUN: 16 mg/dL (ref 10–36)
CO2: 26 mmol/L (ref 20–29)
Calcium: 9.5 mg/dL (ref 8.7–10.3)
Chloride: 104 mmol/L (ref 96–106)
Creatinine, Ser: 0.87 mg/dL (ref 0.57–1.00)
GFR calc Af Amer: 68 mL/min/{1.73_m2} (ref >59)
GFR calc non Af Amer: 59 mL/min/{1.73_m2} — ABNORMAL LOW (ref >59)
Glucose: 79 mg/dL (ref 65–99)
Potassium: 4.4 mmol/L (ref 3.5–5.2)
Sodium: 145 mmol/L — ABNORMAL HIGH (ref 134–144)

## 2019-12-07 NOTE — Progress Notes (Signed)
Cardiology Office Note    Date:  12/09/2019   ID:  [name], DOB 1927/11/19, MRN [id number]  PCP:  Aretta Nip, MD  Cardiologist: Ena Dawley, MD EPS: Thompson Grayer, MD  No chief complaint on file.   History of Present Illness:  [name] is a 86 y.o. female with history of paroxysmal atrial fibrillation on flecainide and Pradaxa, bradycardia tachy syndrome status post pacemaker in 2009 with generator change to [facility] dual-chamber 2017, hypertension, hypothyroidism, chronic diastolic CHF. normal device function 10/16/2019   I saw the patient 09/16/2019 when she was having dizziness and presyncope after taking Zaroxolyn for edema.  She had lost 10 pounds.  She was not orthostatic in our office that day.  I stopped her Zaroxolyn and asked her to use Lasix 40 mg as needed for edema.  K was 3.4 creatinine 0.81. Saw EP and PAF well controlled on flecainide.   I saw the patient back 11/04/2019 and she was taking Lasix daily instead of as needed.  She was feeling well.  I reduce Lasix to 20 mg daily and potassium to 10 mEq daily. Crt was stable at 0.87  Patient comes in accompanied by her daughter. Has lost another 4 lbs. Has quite eating sweets. A bit confused over her lasix and potassium dose. Will call us when she gets home to verify. No shortness of breath and minimal edema.    Past Medical History:  Diagnosis Date  . Aortic stenosis 03/08/2016  . Brady-tachy syndrome ([place])    a. Biotronik dual chamber PPM implanted 2009 b. gen change to STJ dual chamber PPM 2017  . Ejection fraction    EF 70%, echo, October, 2009  //   EF 55-60%, septal dyssynergy consistent with a paced rhythm, mild to moderate mitral regurgitation, echo, November, 2015   . GERD (gastroesophageal reflux disease) 04/28/2014   Episodes November, 2015 with fluid refluxing from her esophagus.   . Hair loss    Patient questioned Coumadin, changed toPradaxa  . Hypertension   . Hypothyroidism   . Lower  extremity edema 01/22/2018  . Mitral regurgitation 05/03/2014   Mild-to-moderate mitral regurgitation, echo, November, 2015   . Osteoarthritis of left knee 10/25/2017  . Paroxysmal atrial fibrillation (HCC)    Episodes rapid atrial fib noted by pacemaker interrogation, August, 2011, diltiazem added, patient improved. Patient continues on Rythmol  //   Changed to flecainide 2013  //   flecainide level checked in 2013, good level   . Persistent atrial fibrillation [facility]     Past Surgical History:  Procedure Laterality Date  . ABDOMINAL HYSTERECTOMY    . EP IMPLANTABLE DEVICE N/A 08/08/2015   Generator change with [facility] [employer] DR PPM by Dr Rayann Heman  . JOINT REPLACEMENT    . PACEMAKER PLACEMENT  2009        Current Medications: Current Meds  Medication Sig  . acetaminophen (TYLENOL) 500 MG tablet Take 500-1,000 mg by mouth every 6 (six) hours as needed for moderate pain.  . Calcium Carbonate-Vit D-Min (CALCIUM 600+D PLUS MINERALS) 600-400 MG-UNIT TABS Take 1 tablet by mouth daily.   . cholecalciferol (VITAMIN D3) 25 MCG (1000 UNIT) tablet Take 1,000 Units by mouth daily.  Marland Kitchen CRANBERRY PO Take 1 capsule by mouth in the morning and at bedtime.   . dabigatran (PRADAXA) 150 MG CAPS capsule Take 1 capsule (150 mg total) by mouth every 12 (twelve) hours.  . diclofenac sodium (VOLTAREN) 1 % GEL Apply 1 application  topically daily as needed (pain).   . flecainide (TAMBOCOR) 50 MG tablet Take 1 tablet (50 mg total) by mouth 2 (two) times daily.  . fluticasone (FLONASE) 50 MCG/ACT nasal spray Place 2 sprays into both nostrils daily as needed for allergies or rhinitis.  . furosemide (LASIX) 20 MG tablet Take 1 tablet (20 mg total) by mouth daily.  Marland Kitchen levothyroxine (SYNTHROID) 112 MCG tablet Take 112 mcg by mouth daily before breakfast.   . Menthol (HONEY LEMON COUGH DROPS MT) Use as directed 1 lozenge in the mouth or throat every 2 (two) hours as needed (sore throat).  . metoprolol succinate (TOPROL-XL)  25 MG 24 hr tablet Take 3 tablets (75 mg total) by mouth daily.  . Multiple Vitamins-Minerals (MEMORY VITE PO) Take 1 capsule by mouth daily.  . Multiple Vitamins-Minerals (OCUVITE PO) Take 1 tablet by mouth daily.   . potassium chloride (KLOR-CON) 10 MEQ tablet Take 1 tablet (10 mEq total) by mouth daily.  . vitamin B-12 (CYANOCOBALAMIN) 100 MCG tablet Take 100 mcg by mouth daily.     Allergies:   Morphine and related, Penicillins, Lasix [furosemide], Aspirin, Clindamycin/lincomycin, Diltiazem, Erythromycin, Lisinopril, and Penicillins cross reactors   Social History   Socioeconomic History  . Marital status: Widowed    Spouse name: Not on file  . Number of children: Not on file  . Years of education: Not on file  . Highest education level: Not on file  Occupational History  . Not on file  Tobacco Use  . Smoking status: Former Smoker    Quit date: 09/06/1990    Years since quitting: 29.2  . Smokeless tobacco: Never Used  Vaping Use  . Vaping Use: Never used  Substance and Sexual Activity  . Alcohol use: No  . Drug use: No  . Sexual activity: Not on file  Other Topics Concern  . Not on file  Social History Narrative  . Not on file   Social Determinants of Health   Financial Resource Strain:   . Difficulty of Paying Living Expenses:   Food Insecurity:   . Worried About Charity fundraiser in the Last Year:   . Arboriculturist in the Last Year:   Transportation Needs:   . Film/video editor (Medical):   Marland Kitchen Lack of Transportation (Non-Medical):   Physical Activity:   . Days of Exercise per Week:   . Minutes of Exercise per Session:   Stress:   . Feeling of Stress :   Social Connections:   . Frequency of Communication with Friends and Family:   . Frequency of Social Gatherings with Friends and Family:   . Attends Religious Services:   . Active Member of Clubs or Organizations:   . Attends Archivist Meetings:   Marland Kitchen Marital Status:      Family History:   The patient's family history includes Coronary artery disease in an other family member; Heart disease in her father and son.   ROS:   Please see the history of present illness.    ROS All other systems reviewed and are negative.   PHYSICAL EXAM:   VS:  BP 130/62   Pulse 84   Ht 5' 3" (1.6 m)   Wt 191 lb (86.6 kg)   SpO2 94%   BMI 33.83 kg/m   Physical Exam  GEN: Well nourished, well developed, in no acute distress  Neck: no JVD, carotid bruits, or masses Cardiac:RRR; 2/6 systolic murmur at the left sternal  border Respiratory:  clear to auscultation bilaterally, normal work of breathing GI: soft, nontender, nondistended, + BS Ext: Trace of edema without cyanosis, clubbing,  Good distal pulses bilaterally Neuro:  Alert and Oriented x 3 Psych: euthymic mood, full affect  Wt Readings from Last 3 Encounters:  12/09/19 191 lb (86.6 kg)  11/04/19 195 lb (88.5 kg)  10/07/19 195 lb (88.5 kg)      Studies/Labs Reviewed:   EKG:  EKG is not ordered today.    Recent Labs: 09/10/2019: ALT 18; Hemoglobin 14.3; Platelets 144 09/16/2019: Magnesium 1.8; TSH 2.240 11/04/2019: BUN 16; Creatinine, Ser 0.87; Potassium 4.4; Sodium 145   Lipid Panel    Component Value Date/Time   CHOL 193 05/13/2017 1224   TRIG 152 (H) 05/13/2017 1224   HDL 41 05/13/2017 1224   CHOLHDL 4.7 (H) 05/13/2017 1224   CHOLHDL 3.9 11/21/2011 1322   VLDL 25 11/21/2011 1322   LDLCALC 122 (H) 05/13/2017 1224    Additional studies/ records that were reviewed today include:    Lower extremity dopplers 09/10/19  Summary:  RIGHT:  - No evidence of deep vein thrombosis in the lower extremity. No indirect  evidence of obstruction proximal to the inguinal ligament.    LEFT:  - There is no evidence of deep vein thrombosis in the lower extremity.    - No cystic structure found in the popliteal fossa.    *See table(s) above for measurements and observations.   Electronically signed by Monica Martinez MD on  09/10/2019 at 5:18:46 PM.        Final      Echo 07/2018   1. The left ventricle has hyperdynamic systolic function of >40%. The cavity size is mildly increased. There is no left ventricular wall thickness. Echo evidence of pseudonormal diastolic filling patterns. Elevated left ventricular end-diastolic  pressure.  2. There is a dynamic mid LV chamber systolic gradient that is 1mHg at rest and increases to 173mg with Valsalva.  3. Mildly dilated left atrial size.  4. Normal right atrial size.  5. The mitral valve normal in structure. There is moderate to severe mitral annular calcification present. Regurgitation is trivial by color flow Doppler.  6. Normal tricuspid valve.  7. Tricuspid regurgitation is mild.  8. The aortic valve tricuspid. There is moderate thickening and moderate calcification of the aortic valve. The calculated aortic valve area is 1.18 cm consistent with mild stenosis.  9. No atrial level shunt detected by color flow Doppler.   FINDINGS  Left Ventricle: Definity contrast agent was given IV to delineate the left ventricular endocardial borders. No evidence of left ventricular regional wall motion abnormalities. The left ventricle has hyperdynamic systolic function of >6>97%The cavity  size is mildly increased. There is no left ventricular wall thickness. Echo evidence of pseudonormal diastolic filling patterns. Elevated left ventricular end-diastolic pressure. Right Ventricle: The right ventricle is normal in size. There is normal hypertrophy. There is normal systolic function. Right ventricular systolic pressure is normal with an estimated pressure of 30.5 mmHg. Left Atrium: The left atrium is mildly dilated. Right Atrium: The right atrial size is normal in size. Interatrial Septum: No atrial level shunt detected by color flow Doppler.   Pericardium: There is no evidence of pericardial effusion. Mitral Valve: The mitral valve normal in structure. There is moderate to  severe mitral annular calcification present. Regurgitation is trivial by color flow Doppler. Tricuspid Valve: The tricuspid valve is normal in structure. Tricuspid regurgitation is mild by color flow  Doppler. Aortic Valve: The aortic valve tricuspid. There is moderate thickening and moderate calcification of the aortic valve. Aortic valve regurgitation was not visualized by color flow Doppler. The calculated aortic valve area is 1.18 cm consistent with mild  stenosis. Pulmonic Valve: The pulmonic valve is normal. Pulmonic valve regurgitation is trivial by color flow Doppler. Venous: The inferior vena cava was normal in size with greater than 50% respiratory variablity. Additional Findings: There is a dynamic mid LV chamber systolic gradient that is 80mHg at rest and increases to 158mg with Valsalva.           ASSESSMENT:    1. Chronic diastolic CHF (congestive heart failure) ([place]  2. Dizziness   3. Paroxysmal atrial fibrillation (HCC)   4. Essential hypertension   5. Aortic valve stenosis, etiology of cardiac valve disease unspecified   6. Pacemaker      PLAN:  In order of problems listed above:  Dizziness and presyncope after taking Zaroxolyn 10 pound weight loss. Dizziness Resolved when  Lasix reduced to 20 mg once daily   Chronic diastolic CHF and leg edema improved and weight continues to decrease.  Patient is a little confused on her Lasix dose and potassium.  [place] and verify that she is on Lasix 20 mg once daily and potassium 10 equivalents daily.  Check be met today and then follow-up with Dr. NeMeda Coffeen 4 months  PAF on flecainide and Pradaxa  Essential hypertension blood pressure controlled  Moderate aortic stenosis with normal LV function on echo 07/16/2019  Pacemaker for sick sinus syndrome followed by Dr. AlRayann Heman  Medication Adjustments/Labs and Tests Ordered: Current medicines are reviewed at length with the patient today.  Concerns regarding  medicines are outlined above.  Medication changes, Labs and Tests ordered today are listed in the Patient Instructions below. Patient Instructions  Medication Instructions:  Your physician recommends that you continue on your current medications as directed. Please refer to the Current Medication list given to you today.  *If you need a refill on your cardiac medications before your next appointment, please call your pharmacy*   Lab Work: TODAY: BMET  If you have labs (blood work) drawn today and your tests are completely normal, you will receive your results only by: . Marland KitchenyChart Message (if you have MyChart) OR . A paper copy in the mail If you have any lab test that is abnormal or we need to change your treatment, we will call you to review the results.   Testing/Procedures: None   Follow-Up: Follow up with Dr. NeMeda Coffeen 04/13/20 at 9:40 AM   Other Instructions Call back to report your current dose of lasix and potassium     Signed, MiErmalinda BarriosPA-C  12/09/2019 11:07 AM    [place]  [id number]: [phone]: [phone]

## 2019-12-09 ENCOUNTER — Ambulatory Visit (INDEPENDENT_AMBULATORY_CARE_PROVIDER_SITE_OTHER): Payer: Medicare Other | Admitting: Physician Assistant

## 2019-12-09 ENCOUNTER — Encounter: Payer: Self-pay | Admitting: Physician Assistant

## 2019-12-09 ENCOUNTER — Other Ambulatory Visit: Payer: Self-pay

## 2019-12-09 ENCOUNTER — Telehealth: Payer: Self-pay | Admitting: Physician Assistant

## 2019-12-09 VITALS — BP 130/62 | HR 84 | Ht 63.0 in | Wt 191.0 lb

## 2019-12-09 DIAGNOSIS — I10 Essential (primary) hypertension: Secondary | ICD-10-CM

## 2019-12-09 DIAGNOSIS — Z950 Presence of cardiac pacemaker: Secondary | ICD-10-CM

## 2019-12-09 DIAGNOSIS — I5032 Chronic diastolic (congestive) heart failure: Secondary | ICD-10-CM

## 2019-12-09 DIAGNOSIS — R42 Dizziness and giddiness: Secondary | ICD-10-CM | POA: Diagnosis not present

## 2019-12-09 DIAGNOSIS — I480 Paroxysmal atrial fibrillation: Secondary | ICD-10-CM | POA: Diagnosis not present

## 2019-12-09 DIAGNOSIS — I350 Nonrheumatic aortic (valve) stenosis: Secondary | ICD-10-CM

## 2019-12-09 LAB — BASIC METABOLIC PANEL
BUN/Creatinine Ratio: 17 (ref 12–28)
BUN: 15 mg/dL (ref 10–36)
CO2: 24 mmol/L (ref 20–29)
Calcium: 9.6 mg/dL (ref 8.7–10.3)
Chloride: 104 mmol/L (ref 96–106)
Creatinine, Ser: 0.9 mg/dL (ref 0.57–1.00)
GFR calc Af Amer: 65 mL/min/{1.73_m2} (ref >59)
GFR calc non Af Amer: 56 mL/min/{1.73_m2} — ABNORMAL LOW (ref >59)
Glucose: 92 mg/dL (ref 65–99)
Potassium: 4.4 mmol/L (ref 3.5–5.2)
Sodium: 144 mmol/L (ref 134–144)

## 2019-12-09 NOTE — Patient Instructions (Signed)
Medication Instructions:  Your physician recommends that you continue on your current medications as directed. Please refer to the Current Medication list given to you today.  *If you need a refill on your cardiac medications before your next appointment, please call your pharmacy*   Lab Work: TODAY: BMET  If you have labs (blood work) drawn today and your tests are completely normal, you will receive your results only by: Marland Kitchen MyChart Message (if you have MyChart) OR . A paper copy in the mail If you have any lab test that is abnormal or we need to change your treatment, we will call you to review the results.   Testing/Procedures: None   Follow-Up: Follow up with Dr. Meda Coffee on 04/13/20 at 9:40 AM   Other Instructions Call back to report your current dose of lasix and potassium

## 2019-12-09 NOTE — Telephone Encounter (Signed)
Patient called and states that she is taking lasix 20 mg QD and k-dur 10 mEq QD. Made patient aware that we will call her tomorrow with her lab results and any new recommendations.

## 2019-12-09 NOTE — Telephone Encounter (Signed)
Patient is calling to let Selinda Eon know that she is on potassium chloride (KLOR-CON) 10 MEQ tablet and on furosemide (LASIX) 20 MG tablet. Like we have on file.

## 2019-12-20 ENCOUNTER — Emergency Department (HOSPITAL_COMMUNITY)
Admission: EM | Admit: 2019-12-20 | Discharge: 2019-12-21 | Disposition: A | Discharge status: Home / Self Care | Payer: Medicare Other | Attending: Emergency Medicine | Admitting: Emergency Medicine

## 2019-12-20 ENCOUNTER — Emergency Department (HOSPITAL_COMMUNITY): Payer: Medicare Other

## 2019-12-20 ENCOUNTER — Encounter (HOSPITAL_COMMUNITY): Payer: Self-pay | Admitting: Emergency Medicine

## 2019-12-20 ENCOUNTER — Other Ambulatory Visit: Payer: Self-pay

## 2019-12-20 DIAGNOSIS — E039 Hypothyroidism, unspecified: Secondary | ICD-10-CM | POA: Insufficient documentation

## 2019-12-20 DIAGNOSIS — R0602 Shortness of breath: Secondary | ICD-10-CM | POA: Diagnosis not present

## 2019-12-20 DIAGNOSIS — I480 Paroxysmal atrial fibrillation: Secondary | ICD-10-CM | POA: Insufficient documentation

## 2019-12-20 DIAGNOSIS — R000 Tachycardia, unspecified: Secondary | ICD-10-CM | POA: Diagnosis not present

## 2019-12-20 DIAGNOSIS — R519 Headache, unspecified: Secondary | ICD-10-CM | POA: Diagnosis present

## 2019-12-20 DIAGNOSIS — I4891 Unspecified atrial fibrillation: Secondary | ICD-10-CM | POA: Diagnosis not present

## 2019-12-20 DIAGNOSIS — R21 Rash and other nonspecific skin eruption: Secondary | ICD-10-CM | POA: Diagnosis not present

## 2019-12-20 DIAGNOSIS — J9811 Atelectasis: Secondary | ICD-10-CM | POA: Diagnosis not present

## 2019-12-20 DIAGNOSIS — Z79899 Other long term (current) drug therapy: Secondary | ICD-10-CM | POA: Diagnosis not present

## 2019-12-20 DIAGNOSIS — R0902 Hypoxemia: Secondary | ICD-10-CM | POA: Diagnosis not present

## 2019-12-20 DIAGNOSIS — I10 Essential (primary) hypertension: Secondary | ICD-10-CM | POA: Diagnosis not present

## 2019-12-20 DIAGNOSIS — Z950 Presence of cardiac pacemaker: Secondary | ICD-10-CM | POA: Insufficient documentation

## 2019-12-20 DIAGNOSIS — G4489 Other headache syndrome: Secondary | ICD-10-CM | POA: Diagnosis not present

## 2019-12-20 DIAGNOSIS — Z87891 Personal history of nicotine dependence: Secondary | ICD-10-CM | POA: Insufficient documentation

## 2019-12-20 NOTE — ED Triage Notes (Signed)
Per EMS, pt from [facility], about 9pm she began to have a headache and feeling of heart palpations.  Pt denies any other symptoms at this time.  Alert and oriented at this time.    180/100 initially 140/82 afib 80-130HR

## 2019-12-21 DIAGNOSIS — R21 Rash and other nonspecific skin eruption: Secondary | ICD-10-CM | POA: Diagnosis not present

## 2019-12-21 DIAGNOSIS — I480 Paroxysmal atrial fibrillation: Secondary | ICD-10-CM | POA: Diagnosis not present

## 2019-12-21 DIAGNOSIS — R0602 Shortness of breath: Secondary | ICD-10-CM | POA: Diagnosis not present

## 2019-12-21 DIAGNOSIS — J9811 Atelectasis: Secondary | ICD-10-CM | POA: Diagnosis not present

## 2019-12-21 LAB — BASIC METABOLIC PANEL
Anion gap: 8 (ref 5–15)
BUN: 20 mg/dL (ref 8–23)
CO2: 26 mmol/L (ref 22–32)
Calcium: 9.6 mg/dL (ref 8.9–10.3)
Chloride: 109 mmol/L (ref 98–111)
Creatinine, Ser: 0.83 mg/dL (ref 0.44–1.00)
GFR calc Af Amer: >60 mL/min (ref >60)
GFR calc non Af Amer: >60 mL/min (ref >60)
Glucose, Bld: 99 mg/dL (ref 70–99)
Potassium: 3.5 mmol/L (ref 3.5–5.1)
Sodium: 143 mmol/L (ref 135–145)

## 2019-12-21 LAB — CBC WITH DIFFERENTIAL/PLATELET
Abs Immature Granulocytes: 0.02 10*3/uL (ref 0.00–0.07)
Basophils Absolute: 0.1 10*3/uL (ref 0.0–0.1)
Basophils Relative: 1 %
Eosinophils Absolute: 0.6 10*3/uL — ABNORMAL HIGH (ref 0.0–0.5)
Eosinophils Relative: 9 %
HCT: 43.6 % (ref 36.0–46.0)
Hemoglobin: 14.6 g/dL (ref 12.0–15.0)
Immature Granulocytes: 0 %
Lymphocytes Relative: 31 %
Lymphs Abs: 2.1 10*3/uL (ref 0.7–4.0)
MCH: 32.9 pg (ref 26.0–34.0)
MCHC: 33.5 g/dL (ref 30.0–36.0)
MCV: 98.2 fL (ref 80.0–100.0)
Monocytes Absolute: 1 10*3/uL (ref 0.1–1.0)
Monocytes Relative: 14 %
Neutro Abs: 3.1 10*3/uL (ref 1.7–7.7)
Neutrophils Relative %: 45 %
Platelets: 145 10*3/uL — ABNORMAL LOW (ref 150–400)
RBC: 4.44 MIL/uL (ref 3.87–5.11)
RDW: 12.3 % (ref 11.5–15.5)
WBC: 6.8 10*3/uL (ref 4.0–10.5)
nRBC: 0 % (ref 0.0–0.2)

## 2019-12-21 LAB — TROPONIN I (HIGH SENSITIVITY): Troponin I (High Sensitivity): 15 ng/L (ref <18)

## 2019-12-21 MED ORDER — FLECAINIDE ACETATE 50 MG PO TABS
50.0000 mg | ORAL_TABLET | Freq: Once | ORAL | Status: AC
  Administered 2019-12-21: 50 mg via ORAL
  Filled 2019-12-21: qty 1

## 2019-12-21 NOTE — ED Provider Notes (Signed)
[facility] EMERGENCY DEPARTMENT Provider Note   CSN: [id number] Arrival date & time: 12/20/19  2300     History Chief Complaint  Patient presents with  . Headache  . Shortness of Breath    [name] is a 86 y.o. female.  The history is provided by the patient and a relative.  Palpitations Palpitations quality:  Irregular Onset quality:  Sudden Duration:  2 hours Timing:  Constant Progression:  Unchanged Chronicity:  Recurrent Relieved by:  None tried Worsened by:  Nothing Associated symptoms: shortness of breath   Associated symptoms: no chest pain, no syncope and no vomiting    Patient with history of aortic stenosis, paroxysmal atrial fibrillation, hypertension presents with palpitations.  Patient reports she thinks she went into atrial fibrillation approximately 9 PM.  She reports mild shortness of breath.  No chest pain.  No syncope.  She also reports a mild headache that is now improving.  No focal weakness.  No recent falls. She reports she takes flecainide daily.    Past Medical History:  Diagnosis Date  . Aortic stenosis 03/08/2016  . Brady-tachy syndrome ([place])    a. Biotronik dual chamber PPM implanted 2009 b. gen change to STJ dual chamber PPM 2017  . Ejection fraction    EF 70%, echo, October, 2009  //   EF 55-60%, septal dyssynergy consistent with a paced rhythm, mild to moderate mitral regurgitation, echo, November, 2015   . GERD (gastroesophageal reflux disease) 04/28/2014   Episodes November, 2015 with fluid refluxing from her esophagus.   . Hair loss    Patient questioned Coumadin, changed toPradaxa  . Hypertension   . Hypothyroidism   . Lower extremity edema 01/22/2018  . Mitral regurgitation 05/03/2014   Mild-to-moderate mitral regurgitation, echo, November, 2015   . Osteoarthritis of left knee 10/25/2017  . Paroxysmal atrial fibrillation (HCC)    Episodes rapid atrial fib noted by pacemaker interrogation, August, 2011,  diltiazem added, patient improved. Patient continues on Rythmol  //   Changed to flecainide 2013  //   flecainide level checked in 2013, good level   . Persistent atrial fibrillation [facility])     Patient Active Problem List   Diagnosis Date Noted  . Lower extremity edema 01/22/2018  . History of total knee replacement, right 10/25/2017  . Osteoarthritis of left knee 10/25/2017  . Persistent atrial fibrillation ([place])   . Hypertension   . Hair loss   . Aortic stenosis 03/08/2016  . Chronic anticoagulation 12/17/2014  . Mitral regurgitation 05/03/2014  . Edema leg 04/28/2014  . GERD (gastroesophageal reflux disease) 04/28/2014  . Fatigue 12/26/2011  . Ejection fraction   . Normal nuclear stress test   . Nausea   . Brady-tachy syndrome ([place])   . Hypothyroidism   . Paroxysmal atrial fibrillation (HCC)   . Drug therapy   . Drug therapy   . Pacemaker   . Cough   . ESSENTIAL HYPERTENSION, BENIGN 07/20/2010    Past Surgical History:  Procedure Laterality Date  . ABDOMINAL HYSTERECTOMY    . EP IMPLANTABLE DEVICE N/A 08/08/2015   Generator change with SJM Assurity DR PPM by Dr Rayann Heman  . JOINT REPLACEMENT    . PACEMAKER PLACEMENT  2009         OB History   No obstetric history on file.     Family History  Problem Relation Age of Onset  . Heart disease Father   . Coronary artery disease Other   . Heart  disease Son     Social History   Tobacco Use  . Smoking status: Former Smoker    Quit date: 09/06/1990    Years since quitting: 29.3  . Smokeless tobacco: Never Used  Vaping Use  . Vaping Use: Never used  Substance Use Topics  . Alcohol use: No  . Drug use: No    Home Medications Prior to Admission medications   Medication Sig Start Date End Date Taking? Authorizing Provider  acetaminophen (TYLENOL) 500 MG tablet Take 500-1,000 mg by mouth every 6 (six) hours as needed for moderate pain.    [provider]  Calcium Carbonate-Vit D-Min (CALCIUM 600+D PLUS  MINERALS) 600-400 MG-UNIT TABS Take 1 tablet by mouth daily.     [provider]  cholecalciferol (VITAMIN D3) 25 MCG (1000 UNIT) tablet Take 1,000 Units by mouth daily.    [provider]  CRANBERRY PO Take 1 capsule by mouth in the morning and at bedtime.     [provider]  dabigatran (PRADAXA) 150 MG CAPS capsule Take 1 capsule (150 mg total) by mouth every 12 (twelve) hours. 09/16/19   Imogene Burn, PA-C  diclofenac sodium (VOLTAREN) 1 % GEL Apply 1 application topically daily as needed (pain).     [provider]  flecainide (TAMBOCOR) 50 MG tablet Take 1 tablet (50 mg total) by mouth 2 (two) times daily. 09/16/19   Imogene Burn, PA-C  fluticasone (FLONASE) 50 MCG/ACT nasal spray Place 2 sprays into both nostrils daily as needed for allergies or rhinitis.    [provider]  furosemide (LASIX) 20 MG tablet Take 1 tablet (20 mg total) by mouth daily. 11/04/19   Imogene Burn, PA-C  levothyroxine (SYNTHROID) 112 MCG tablet Take 112 mcg by mouth daily before breakfast.     [provider]  Menthol (HONEY LEMON COUGH DROPS MT) Use as directed 1 lozenge in the mouth or throat every 2 (two) hours as needed (sore throat).    [provider]  metoprolol succinate (TOPROL-XL) 25 MG 24 hr tablet Take 3 tablets (75 mg total) by mouth daily. 09/16/19   Imogene Burn, PA-C  Multiple Vitamins-Minerals (MEMORY VITE PO) Take 1 capsule by mouth daily.    [provider]  Multiple Vitamins-Minerals (OCUVITE PO) Take 1 tablet by mouth daily.     [provider]  potassium chloride (KLOR-CON) 10 MEQ tablet Take 1 tablet (10 mEq total) by mouth daily. 11/04/19   Imogene Burn, PA-C  vitamin B-12 (CYANOCOBALAMIN) 100 MCG tablet Take 100 mcg by mouth daily.    [provider]    Allergies    Morphine and related, Penicillins, Lasix [furosemide], Aspirin, Clindamycin/lincomycin, Diltiazem, Erythromycin, Lisinopril,  and Penicillins cross reactors  Review of Systems   Review of Systems  Constitutional: Negative for fever.  Respiratory: Positive for shortness of breath.   Cardiovascular: Positive for palpitations. Negative for chest pain and syncope.  Gastrointestinal: Negative for vomiting.  Neurological: Positive for headaches. Negative for syncope.  All other systems reviewed and are negative.   Physical Exam Updated Vital Signs BP 115/73   Pulse 100   Temp 98.3 F (36.8 C) (Oral)   Resp 18   Ht 1.6 m (5\' 3" )   Wt 85.3 kg   SpO2 94%   BMI 33.30 kg/m   Physical Exam CONSTITUTIONAL: Elderly, appears younger than stated age HEAD: Normocephalic/atraumatic, no temporal artery tenderness EYES: EOMI/PERRL ENMT: Mucous membranes moist NECK: supple no meningeal signs  SPINE/BACK:entire spine nontender CV: Irregular, no loud murmurs LUNGS: Lungs are clear to auscultation bilaterally, no apparent distress ABDOMEN: soft, nontender, no rebound or guarding, bowel sounds noted throughout abdomen GU:no cva tenderness NEURO: Pt is awake/alert/appropriate, moves all extremitiesx4.  No facial droop.  No arm or leg drift  EXTREMITIES: pulses normal/equal, full ROM SKIN: warm, color normal PSYCH: no abnormalities of mood noted, alert and oriented to situation  ED Results / Procedures / Treatments   Labs (all labs ordered are listed, but only abnormal results are displayed) Labs Reviewed  CBC WITH DIFFERENTIAL/PLATELET - Abnormal; Notable for the following components:      Result Value   Platelets 145 (*)    Eosinophils Absolute 0.6 (*)    All other components within normal limits  BASIC METABOLIC PANEL  TROPONIN I (HIGH SENSITIVITY)    EKG EKG Interpretation  Date/Time:  Monday December 21 2019 00:10:43 EDT Ventricular Rate:  106 PR Interval:    QRS Duration: 92 QT Interval:  337 QTC Calculation: 448 R Axis:   2 Text Interpretation: Atrial fibrillation Probable LVH with secondary repol  abnrm Confirmed by Ripley Fraise [phone]) on 12/21/2019 12:19:26 AM   Radiology DG Chest 2 View  Result Date: 12/21/2019 CLINICAL DATA:  86 year old female with shortness of breath. EXAM: CHEST - 2 VIEW COMPARISON:  Chest radiograph dated 09/06/2010. FINDINGS: There are minimal bibasilar atelectasis. No focal consolidation, large pleural effusion, or pneumothorax. The cardiac silhouette is within limits. Atherosclerotic calcification of the aorta. Left pectoral pacemaker device. No acute osseous pathology. IMPRESSION: No active cardiopulmonary disease. Electronically Signed   By: Anner Crete M.D.   On: 12/21/2019 00:05    Procedures Procedures   Medications Ordered in ED Medications  flecainide (TAMBOCOR) tablet 50 mg (50 mg Oral Given 12/21/19 0115)    ED Course  I have reviewed the triage vital signs and the nursing notes.  Pertinent labs & imaging results that were available during my care of the patient were reviewed by me and considered in my medical decision making (see chart for details).    MDM Rules/Calculators/A&P                          12:47 AM Patient with history of paroxysmal atrial fibrillation presents with recurrent episode.  She is in no acute distress.  She is rate controlled.  She is still in atrial fibrillation per EKG.  We will give her an additional dose of her flecainide.  Chest x-Foister was reviewed and is negative.  Labs are pending at this time 2:24 AM Stable, labs and imaging are negative.  Awaiting chemical cardioversion 4:02 AM Patient is improved.  No chest pain or shortness of breath.  Repeat EKG below Patient has paced rhythm  EKG Interpretation  Date/Time:  Monday December 21 2019 03:47:25 EDT Ventricular Rate:  63 PR Interval:    QRS Duration: 91 QT Interval:  429 QTC Calculation: 440 R Axis:   8 Text Interpretation: Atrial-paced rhythm Anteroseptal infarct, old Repol abnrm suggests ischemia, diffuse leads Confirmed by Ripley Fraise  [phone]) on 12/21/2019 3:50:55 AM      On the monitor patient does appear to have sinus beats. At this point I feel she is appropriate for discharge home.  I have instructed her to follow-up with her cardiologist this week.  She reports only taken flecainide once daily, but is listed in her med list as twice daily.  This needs to be reviewed by  cardiology Final Clinical Impression(s) / ED Diagnoses Final diagnoses:  Paroxysmal A-fib [facility])    Rx / DC Orders ED Discharge Orders    None       Ripley Fraise, MD 12/21/19 0403

## 2019-12-30 ENCOUNTER — Ambulatory Visit (HOSPITAL_COMMUNITY)
Admission: RE | Admit: 2019-12-30 | Discharge: 2019-12-30 | Disposition: A | Discharge status: Home / Self Care | Payer: Medicare Other | Source: Ambulatory Visit | Attending: Physician Assistant | Admitting: Physician Assistant

## 2019-12-30 ENCOUNTER — Encounter (HOSPITAL_COMMUNITY): Payer: Self-pay | Admitting: Physician Assistant

## 2019-12-30 ENCOUNTER — Other Ambulatory Visit: Payer: Self-pay

## 2019-12-30 VITALS — BP 140/66 | HR 62 | Ht 63.0 in | Wt 190.0 lb

## 2019-12-30 DIAGNOSIS — Z950 Presence of cardiac pacemaker: Secondary | ICD-10-CM | POA: Insufficient documentation

## 2019-12-30 DIAGNOSIS — I110 Hypertensive heart disease with heart failure: Secondary | ICD-10-CM | POA: Insufficient documentation

## 2019-12-30 DIAGNOSIS — Z79890 Hormone replacement therapy (postmenopausal): Secondary | ICD-10-CM | POA: Insufficient documentation

## 2019-12-30 DIAGNOSIS — I350 Nonrheumatic aortic (valve) stenosis: Secondary | ICD-10-CM | POA: Diagnosis not present

## 2019-12-30 DIAGNOSIS — I5032 Chronic diastolic (congestive) heart failure: Secondary | ICD-10-CM | POA: Diagnosis not present

## 2019-12-30 DIAGNOSIS — E039 Hypothyroidism, unspecified: Secondary | ICD-10-CM | POA: Diagnosis not present

## 2019-12-30 DIAGNOSIS — Z87891 Personal history of nicotine dependence: Secondary | ICD-10-CM | POA: Diagnosis not present

## 2019-12-30 DIAGNOSIS — I480 Paroxysmal atrial fibrillation: Secondary | ICD-10-CM | POA: Diagnosis not present

## 2019-12-30 DIAGNOSIS — D6869 Other thrombophilia: Secondary | ICD-10-CM | POA: Diagnosis not present

## 2019-12-30 DIAGNOSIS — Z79899 Other long term (current) drug therapy: Secondary | ICD-10-CM | POA: Insufficient documentation

## 2019-12-30 NOTE — Patient Instructions (Addendum)
Take Flecainide 50mg - TWICE DAILY

## 2019-12-30 NOTE — Progress Notes (Signed)
Primary Care Physician: Aretta Nip, MD Primary Cardiologist: Dr Meda Coffee Primary Electrophysiologist: Dr Rayann Heman Referring Physician: [facility]   [name] is a 86 y.o. female with a history of paroxysmal atrial fibrillation, bradycardia tachysyndrome status post PPM, hypertension, hypothyroidism, chronic diastolic CHF who presents for follow up in the [facility]. Patient is on Pradaxa for a CHADS2VASC score of 4. She was seen at the ED 12/21/19 for sudden onset palpitations and SOB, similar to her afib episodes in the past. Patient admits she has been taking flecainide only once per day. She denies any bleeding issues on anticoagulation.   Today, she denies symptoms of palpitations, chest pain, shortness of breath, orthopnea, PND, lower extremity edema, dizziness, presyncope, syncope, snoring, daytime somnolence, bleeding, or neurologic sequela. The patient is tolerating medications without difficulties and is otherwise without complaint today.    Atrial Fibrillation Risk Factors:  she does not have symptoms or diagnosis of sleep apnea. she does not have a history of rheumatic fever.  she has a BMI of Body mass index is 33.66 kg/m.Marland Kitchen Filed Weights   12/30/19 1134  Weight: 86.2 kg    Family History  Problem Relation Age of Onset  . Heart disease Father   . Coronary artery disease Other   . Heart disease Son      Atrial Fibrillation Management history:  Previous antiarrhythmic drugs: flecainide Previous cardioversions: none Previous ablations: none CHADS2VASC score: 4 Anticoagulation history: Pradaxa   Past Medical History:  Diagnosis Date  . Aortic stenosis 03/08/2016  . Brady-tachy syndrome ([place])    a. Biotronik dual chamber PPM implanted 2009 b. gen change to STJ dual chamber PPM 2017  . Ejection fraction    EF 70%, echo, October, 2009  //   EF 55-60%, septal dyssynergy consistent with a paced rhythm, mild to moderate mitral  regurgitation, echo, November, 2015   . GERD (gastroesophageal reflux disease) 04/28/2014   Episodes November, 2015 with fluid refluxing from her esophagus.   . Hair loss    Patient questioned Coumadin, changed toPradaxa  . Hypertension   . Hypothyroidism   . Lower extremity edema 01/22/2018  . Mitral regurgitation 05/03/2014   Mild-to-moderate mitral regurgitation, echo, November, 2015   . Osteoarthritis of left knee 10/25/2017  . Paroxysmal atrial fibrillation (HCC)    Episodes rapid atrial fib noted by pacemaker interrogation, August, 2011, diltiazem added, patient improved. Patient continues on Rythmol  //   Changed to flecainide 2013  //   flecainide level checked in 2013, good level   . Persistent atrial fibrillation [facility])    Past Surgical History:  Procedure Laterality Date  . ABDOMINAL HYSTERECTOMY    . EP IMPLANTABLE DEVICE N/A 08/08/2015   Generator change with SJM Assurity DR PPM by Dr Rayann Heman  . JOINT REPLACEMENT    . PACEMAKER PLACEMENT  2009        Current Outpatient Medications  Medication Sig Dispense Refill  . acetaminophen (TYLENOL) 500 MG tablet Take 500-1,000 mg by mouth every 6 (six) hours as needed for moderate pain.    . Calcium Carbonate-Vit D-Min (CALCIUM 600+D PLUS MINERALS) 600-400 MG-UNIT TABS Take 1 tablet by mouth daily.     . cholecalciferol (VITAMIN D3) 25 MCG (1000 UNIT) tablet Take 1,000 Units by mouth daily.    Marland Kitchen CRANBERRY PO Take 1 capsule by mouth in the morning and at bedtime.     . dabigatran (PRADAXA) 150 MG CAPS capsule Take 1 capsule (150  mg total) by mouth every 12 (twelve) hours. 180 capsule 1  . diclofenac sodium (VOLTAREN) 1 % GEL Apply 1 application topically daily as needed (pain).     . flecainide (TAMBOCOR) 50 MG tablet Take 1 tablet (50 mg total) by mouth 2 (two) times daily. 180 tablet 3  . fluticasone (FLONASE) 50 MCG/ACT nasal spray Place 2 sprays into both nostrils daily as needed for allergies or rhinitis.    . furosemide (LASIX)  20 MG tablet Take 1 tablet (20 mg total) by mouth daily. (Patient taking differently: Take 10 mg by mouth daily. ) 90 tablet 3  . levothyroxine (SYNTHROID) 112 MCG tablet Take 112 mcg by mouth daily before breakfast.     . Menthol (HONEY LEMON COUGH DROPS MT) Use as directed 1 lozenge in the mouth or throat every 2 (two) hours as needed (sore throat).    . metoprolol succinate (TOPROL-XL) 25 MG 24 hr tablet Take 3 tablets (75 mg total) by mouth daily. 270 tablet 3  . Multiple Vitamins-Minerals (MEMORY VITE PO) Take 1 capsule by mouth daily.    . Multiple Vitamins-Minerals (OCUVITE PO) Take 1 tablet by mouth daily.     . potassium chloride (KLOR-CON) 10 MEQ tablet Take 1 tablet (10 mEq total) by mouth daily. 60 tablet 11  . vitamin B-12 (CYANOCOBALAMIN) 100 MCG tablet Take 100 mcg by mouth daily.     No current facility-administered medications for this encounter.    Allergies  Allergen Reactions  . Morphine And Related Other (See Comments)    Patient states that she went "crazy" with this  . Penicillins Swelling and Rash      . Lasix [Furosemide] Itching    Patient stated it made her itch all over and dry.  . Aspirin     Pacemaker  . Clindamycin/Lincomycin Other (See Comments)    Any meds with mycin in the name Unknown reaction  . Diltiazem     Skin discoloration, lower extremity swelling  . Erythromycin Other (See Comments)    Any meds with mycin in the name Unknown reaction  . Lisinopril Other (See Comments)    REACTION: Cough  . Penicillins Cross Reactors Other (See Comments)    Any meds with mycin in the name Unknown reaction    Social History   Socioeconomic History  . Marital status: Widowed    Spouse name: Not on file  . Number of children: Not on file  . Years of education: Not on file  . Highest education level: Not on file  Occupational History  . Not on file  Tobacco Use  . Smoking status: Former Smoker    Quit date: 09/06/1990    Years since quitting: 29.3   . Smokeless tobacco: Never Used  Vaping Use  . Vaping Use: Never used  Substance and Sexual Activity  . Alcohol use: No  . Drug use: No  . Sexual activity: Not on file  Other Topics Concern  . Not on file  Social History Narrative  . Not on file   Social Determinants of Health   Financial Resource Strain:   . Difficulty of Paying Living Expenses:   Food Insecurity:   . Worried About Charity fundraiser in the Last Year:   . Arboriculturist in the Last Year:   Transportation Needs:   . Film/video editor (Medical):   Marland Kitchen Lack of Transportation (Non-Medical):   Physical Activity:   . Days of Exercise per Week:   .  Minutes of Exercise per Session:   Stress:   . Feeling of Stress :   Social Connections:   . Frequency of Communication with Friends and Family:   . Frequency of Social Gatherings with Friends and Family:   . Attends Religious Services:   . Active Member of Clubs or Organizations:   . Attends Archivist Meetings:   Marland Kitchen Marital Status:   Intimate Partner Violence:   . Fear of Current or Ex-Partner:   . Emotionally Abused:   Marland Kitchen Physically Abused:   . Sexually Abused:      ROS- All systems are reviewed and negative except as per the HPI above.  Physical Exam: Vitals:   12/30/19 1134  BP: 140/66  Pulse: 62  Weight: 86.2 kg  Height: 5\' 3"  (1.6 m)    GEN- The patient is well appearing elderly obese female, alert and oriented x 3 today.   HEENT-head normocephalic, atraumatic, sclera clear, conjunctiva pink, hearing intact, trachea midline. Lungs- Clear to ausculation bilaterally, normal work of breathing Heart- Regular rate and rhythm, no rubs or gallops. 2/6 systolic murmur  GI- soft, NT, ND, + BS Extremities- no clubbing, cyanosis, or edema MS- no significant deformity or atrophy Skin- no rash or lesion Psych- euthymic mood, full affect Neuro- strength and sensation are intact   Wt Readings from Last 3 Encounters:  12/30/19 86.2 kg    12/20/19 85.3 kg  12/09/19 86.6 kg    EKG today demonstrates A paced rhythm with prolonged conduction HR 62, NST, PR 278, QRS 88, QTc 432  Echo 07/16/19 demonstrated  1. Left ventricular ejection fraction, by visual estimation, is 70 to  75%. The left ventricle has hyperdynamic function. There is moderately  increased left ventricular hypertrophy.  2. Elevated left atrial pressure.  3. Left ventricular diastolic parameters are consistent with Grade I  diastolic dysfunction (impaired relaxation).  4. The left ventricle has no regional wall motion abnormalities.  5. Global right ventricle has normal systolic function.The right  ventricular size is normal. No increase in right ventricular wall  thickness.  6. Left atrial size was moderately dilated.  7. Right atrial size was normal.  8. Moderate thickening of the mitral valve leaflet(s).  9. Severe mitral annular calcification.  10. The mitral valve is normal in structure. Mild mitral valve  regurgitation. No evidence of mitral stenosis.  11. The tricuspid valve is normal in structure.  12. The tricuspid valve is normal in structure. Tricuspid valve  regurgitation is mild.  13. Aortic valve mean gradient measures 26.0 mmHg.  14. The aortic valve is normal in structure. Aortic valve regurgitation is  not visualized. Moderate aortic valve stenosis.  15. There is severe calcifcation of the aortic valve.  16. There is severe thickening of the aortic valve.  17. The pulmonic valve was normal in structure. Pulmonic valve  regurgitation is not visualized.  18. Normal pulmonary artery systolic pressure.  19. The tricuspid regurgitant velocity is 2.06 m/s, and with an assumed  right atrial pressure of 3 mmHg, the estimated right ventricular systolic  pressure is normal at 20.0 mmHg.  20. The inferior vena cava is normal in size with greater than 50%  respiratory variability, suggesting right atrial pressure of 3 mmHg.   Epic  records are reviewed at length today  CHA2DS2-VASc Score = 4  The patient's score is based upon: CHF History: 0 HTN History: 1 Age : 2 Diabetes History: 0 Stroke History: 0 Vascular Disease History: 0 Gender: 1  ASSESSMENT AND PLAN: 1. Paroxysmal Atrial Fibrillation (ICD10:  I48.0) The patient's CHA2DS2-VASc score is 4, indicating a 4.8% annual risk of stroke.   Overall afib burden low. Suspect recent episode related to her taking flecainide only once per day. Continue flecainide 50 mg BID. We discussed proper use of medication. Continue Toprol 75 mg daily Continue Pradaxa 150 mg BID  2. Secondary Hypercoagulable State (ICD10:  D68.69){ The patient is at significant risk for stroke/thromboembolism based upon her CHA2DS2-VASc Score of 4.  Continue Dabigatran (Pradaxa).   3. Symptomatic bradycardia S/p PPM, followed by Dr Rayann Heman and the device clinic.  4. HTN Stable, no changes today.   Follow up in the AF clinic next week for ECG.   [facility] [address] [place], [place] [address] [phone] 12/30/2019 12:13 PM

## 2020-01-05 ENCOUNTER — Ambulatory Visit (HOSPITAL_COMMUNITY)
Admission: RE | Admit: 2020-01-05 | Discharge: 2020-01-05 | Disposition: A | Discharge status: Home / Self Care | Payer: Medicare Other | Source: Ambulatory Visit | Attending: Physician Assistant | Admitting: Physician Assistant

## 2020-01-05 ENCOUNTER — Other Ambulatory Visit: Payer: Self-pay

## 2020-01-05 DIAGNOSIS — I4891 Unspecified atrial fibrillation: Secondary | ICD-10-CM | POA: Insufficient documentation

## 2020-01-05 NOTE — Progress Notes (Signed)
Pt in for EKG after increasing flecainide to 50 mg bid. EKG shows atrial paced rhythm at 60 bpm, pr int 290 ms, qrs int 90 ms, qtc 424 ms. She has not felt any further irregular rhythm.

## 2020-01-15 ENCOUNTER — Ambulatory Visit (INDEPENDENT_AMBULATORY_CARE_PROVIDER_SITE_OTHER): Payer: Medicare Other | Admitting: *Deleted

## 2020-01-15 DIAGNOSIS — I495 Sick sinus syndrome: Secondary | ICD-10-CM | POA: Diagnosis not present

## 2020-01-15 LAB — CUP PACEART REMOTE DEVICE CHECK
Battery Remaining Longevity: 125 mo
Battery Remaining Percentage: 95.5 %
Battery Voltage: 2.99 V
Brady Statistic AP VP Percent: 1.6 %
Brady Statistic AP VS Percent: 98 %
Brady Statistic AS VP Percent: 1 %
Brady Statistic AS VS Percent: 1 %
Brady Statistic RA Percent Paced: 99 %
Brady Statistic RV Percent Paced: 1.6 %
Date Time Interrogation Session: 20210806040014
Implantable Lead Implant Date: 20091109
Implantable Lead Implant Date: 20091109
Implantable Lead Location: 753859
Implantable Lead Location: 753860
Implantable Lead Model: 350
Implantable Lead Serial Number: 24891460
Implantable Lead Serial Number: 28411861
Implantable Pulse Generator Implant Date: 20170227
Lead Channel Impedance Value: 1425 Ohm
Lead Channel Impedance Value: 460 Ohm
Lead Channel Pacing Threshold Amplitude: 0.75 V
Lead Channel Pacing Threshold Amplitude: 1 V
Lead Channel Pacing Threshold Pulse Width: 0.4 ms
Lead Channel Pacing Threshold Pulse Width: 0.4 ms
Lead Channel Sensing Intrinsic Amplitude: 12 mV
Lead Channel Sensing Intrinsic Amplitude: 2.3 mV
Lead Channel Setting Pacing Amplitude: 2 V
Lead Channel Setting Pacing Amplitude: 2.5 V
Lead Channel Setting Pacing Pulse Width: 0.4 ms
Lead Channel Setting Sensing Sensitivity: 2 mV
Pulse Gen Model: 2240
Pulse Gen Serial Number: 7885781

## 2020-01-18 NOTE — Progress Notes (Signed)
Remote pacemaker transmission.   

## 2020-01-20 DIAGNOSIS — I10 Essential (primary) hypertension: Secondary | ICD-10-CM | POA: Diagnosis not present

## 2020-01-20 DIAGNOSIS — I495 Sick sinus syndrome: Secondary | ICD-10-CM | POA: Diagnosis not present

## 2020-01-20 DIAGNOSIS — E039 Hypothyroidism, unspecified: Secondary | ICD-10-CM | POA: Diagnosis not present

## 2020-01-20 DIAGNOSIS — I480 Paroxysmal atrial fibrillation: Secondary | ICD-10-CM | POA: Diagnosis not present

## 2020-02-19 ENCOUNTER — Other Ambulatory Visit: Payer: Self-pay

## 2020-02-19 MED ORDER — METOPROLOL SUCCINATE ER 25 MG PO TB24: 75.0000 mg | ORAL_TABLET | Freq: Every day | ORAL | 2 refills | Status: DC

## 2020-02-19 NOTE — Telephone Encounter (Signed)
Pt's medication was sent to pt's pharmacy as requested. Confirmation received.  °

## 2020-03-08 DIAGNOSIS — Z23 Encounter for immunization: Secondary | ICD-10-CM | POA: Diagnosis not present

## 2020-03-23 DIAGNOSIS — E039 Hypothyroidism, unspecified: Secondary | ICD-10-CM | POA: Diagnosis not present

## 2020-04-13 ENCOUNTER — Ambulatory Visit (INDEPENDENT_AMBULATORY_CARE_PROVIDER_SITE_OTHER): Payer: Medicare Other | Admitting: Cardiology

## 2020-04-13 ENCOUNTER — Encounter: Payer: Self-pay | Admitting: Cardiology

## 2020-04-13 ENCOUNTER — Other Ambulatory Visit: Payer: Self-pay

## 2020-04-13 VITALS — BP 128/70 | HR 60 | Ht 63.0 in | Wt 192.4 lb

## 2020-04-13 DIAGNOSIS — Z950 Presence of cardiac pacemaker: Secondary | ICD-10-CM | POA: Diagnosis not present

## 2020-04-13 DIAGNOSIS — I480 Paroxysmal atrial fibrillation: Secondary | ICD-10-CM

## 2020-04-13 DIAGNOSIS — E876 Hypokalemia: Secondary | ICD-10-CM | POA: Diagnosis not present

## 2020-04-13 DIAGNOSIS — I10 Essential (primary) hypertension: Secondary | ICD-10-CM

## 2020-04-13 DIAGNOSIS — I350 Nonrheumatic aortic (valve) stenosis: Secondary | ICD-10-CM

## 2020-04-13 DIAGNOSIS — I495 Sick sinus syndrome: Secondary | ICD-10-CM

## 2020-04-13 DIAGNOSIS — R42 Dizziness and giddiness: Secondary | ICD-10-CM

## 2020-04-13 DIAGNOSIS — I5032 Chronic diastolic (congestive) heart failure: Secondary | ICD-10-CM | POA: Diagnosis not present

## 2020-04-13 MED ORDER — POTASSIUM CHLORIDE CRYS ER 10 MEQ PO TBCR: 10.0000 meq | EXTENDED_RELEASE_TABLET | Freq: Every day | ORAL | 11 refills | Status: DC

## 2020-04-13 MED ORDER — NITROFURANTOIN MONOHYD MACRO 100 MG PO CAPS: 100.0000 mg | ORAL_CAPSULE | Freq: Two times a day (BID) | ORAL | 0 refills | Status: DC

## 2020-04-13 MED ORDER — FLECAINIDE ACETATE 50 MG PO TABS: 50.0000 mg | ORAL_TABLET | Freq: Two times a day (BID) | ORAL | 3 refills | Status: DC

## 2020-04-13 MED ORDER — DABIGATRAN ETEXILATE MESYLATE 150 MG PO CAPS: 150.0000 mg | ORAL_CAPSULE | Freq: Two times a day (BID) | ORAL | 2 refills | Status: DC

## 2020-04-13 MED ORDER — METOPROLOL SUCCINATE ER 25 MG PO TB24: 75.0000 mg | ORAL_TABLET | Freq: Every day | ORAL | 2 refills | Status: DC

## 2020-04-13 MED ORDER — FUROSEMIDE 40 MG PO TABS
40.0000 mg | ORAL_TABLET | Freq: Every day | ORAL | 1 refills | Status: DC
Start: 2020-04-13 — End: 2020-09-25

## 2020-04-13 NOTE — Patient Instructions (Addendum)
Medication Instructions:   START TAKING NITROFURANTOIN (MACROBID) 100 MG BY MOUTH TWICE DAILY FOR 5 DAYS ONLY FOR UTI.  INCREASE YOUR LASIX TO 40 MG BY MOUTH DAILY  *If you need a refill on your cardiac medications before your next appointment, please call your pharmacy*   Testing/Procedures:  Your physician has requested that you have an echocardiogram. Echocardiography is a painless test that uses sound waves to create images of your heart. It provides your doctor with information about the size and shape of your heart and how well your heart's chambers and valves are working. This procedure takes approximately one hour. There are no restrictions for this procedure.  PLEASE SCHEDULE ECHO FOR 5 TO 5 1/2 MONTHS OUT FROM NOW.  Follow-Up: At [facility], you and your health needs are our priority.  As part of our continuing mission to provide you with exceptional heart care, we have created designated Provider Care Teams.  These Care Teams include your primary Cardiologist (physician) and Advanced Practice Providers (APPs -  Physician Assistants and Nurse Practitioners) who all work together to provide you with the care you need, when you need it.  We recommend signing up for the patient portal called "MyChart".  Sign up information is provided on this After Visit Summary.  MyChart is used to connect with patients for Virtual Visits (Telemedicine).  Patients are able to view lab/test results, encounter notes, upcoming appointments, etc.  Non-urgent messages can be sent to your provider as well.   To learn more about what you can do with MyChart, go to [email or web address].    Your next appointment:   6 month(s)  The format for your next appointment:   In Person  Provider:   Ena Dawley, MD

## 2020-04-13 NOTE — Progress Notes (Signed)
Cardiology Office Note    Date:  04/13/2020   ID:  [name], DOB Dec 29, 1927, MRN [id number]  PCP:  Aretta Nip, MD  Cardiologist: Ena Dawley, MD EPS: Thompson Grayer, MD  Reason for visit: 6 months follow-up  History of Present Illness:  [name] is a 86 y.o. female with history of paroxysmal atrial fibrillation on flecainide and Pradaxa, bradycardia tachy syndrome status post pacemaker in 2009 with generator change to [facility] dual-chamber 2017, hypertension, hypothyroidism, chronic diastolic CHF. normal device function 10/16/2019   She was seen on 09/16/2019 when she was having dizziness and presyncope after taking Zaroxolyn for edema.  She had lost 10 pounds.  She was not orthostatic in our office that day. Zaroxolyn was stopped. Later in May a low-dose of Lasix 20 mg daily was added.  Today patient comes accompanied by her daughter, she states that she has mild lower extremity edema, she has been compliant with her medications and has no side effects. She denies any palpitations, she gets episodes of dizziness multiple times a week but no presyncope syncope or falls. She walks with a walker at home. Denies any chest pain, no orthopnea or proximal nocturnal dyspnea. She has noticed blood and dysuria in the last few days. In the past she used to have frequent urinary tract infection for which she was seeing a urologist but has not seen in the last 5 years.  Past Medical History:  Diagnosis Date  . Aortic stenosis 03/08/2016  . Brady-tachy syndrome ([place])    a. Biotronik dual chamber PPM implanted 2009 b. gen change to STJ dual chamber PPM 2017  . Ejection fraction    EF 70%, echo, October, 2009  //   EF 55-60%, septal dyssynergy consistent with a paced rhythm, mild to moderate mitral regurgitation, echo, November, 2015   . GERD (gastroesophageal reflux disease) 04/28/2014   Episodes November, 2015 with fluid refluxing from her esophagus.   . Hair loss    Patient  questioned Coumadin, changed toPradaxa  . Hypertension   . Hypothyroidism   . Lower extremity edema 01/22/2018  . Mitral regurgitation 05/03/2014   Mild-to-moderate mitral regurgitation, echo, November, 2015   . Osteoarthritis of left knee 10/25/2017  . Paroxysmal atrial fibrillation (HCC)    Episodes rapid atrial fib noted by pacemaker interrogation, August, 2011, diltiazem added, patient improved. Patient continues on Rythmol  //   Changed to flecainide 2013  //   flecainide level checked in 2013, good level   . Persistent atrial fibrillation [facility])    Past Surgical History:  Procedure Laterality Date  . ABDOMINAL HYSTERECTOMY    . EP IMPLANTABLE DEVICE N/A 08/08/2015   Generator change with [employer] DR PPM by Dr Rayann Heman  . JOINT REPLACEMENT    . PACEMAKER PLACEMENT  2009       Current Medications: Current Meds  Medication Sig  . acetaminophen (TYLENOL) 500 MG tablet Take 500-1,000 mg by mouth every 6 (six) hours as needed for moderate pain.   . Calcium Carbonate-Vit D-Min (CALCIUM 600+D PLUS MINERALS) 600-400 MG-UNIT TABS Take 1 tablet by mouth daily.   . cholecalciferol (VITAMIN D3) 25 MCG (1000 UNIT) tablet Take 1,000 Units by mouth daily.  . dabigatran (PRADAXA) 150 MG CAPS capsule Take 1 capsule (150 mg total) by mouth every 12 (twelve) hours.  . diclofenac sodium (VOLTAREN) 1 % GEL Apply 1 application topically daily as needed (pain).   . fluticasone (FLONASE) 50 MCG/ACT nasal spray Place 2  sprays into both nostrils daily as needed for allergies or rhinitis.   Marland Kitchen levothyroxine (SYNTHROID) 125 MCG tablet Take 125 mcg by mouth every morning.  . Menthol (HONEY LEMON COUGH DROPS MT) Use as directed 1 lozenge in the mouth or throat every 2 (two) hours as needed (sore throat).  . Multiple Vitamins-Minerals (MEMORY VITE PO) Take 1 capsule by mouth daily.  . Multiple Vitamins-Minerals (OCUVITE PO) Take 1 tablet by mouth daily.   . vitamin B-12 (CYANOCOBALAMIN) 100 MCG tablet Take 100  mcg by mouth daily.  . [DISCONTINUED] dabigatran (PRADAXA) 150 MG CAPS capsule Take 1 capsule (150 mg total) by mouth every 12 (twelve) hours.  . [DISCONTINUED] flecainide (TAMBOCOR) 50 MG tablet Take 1 tablet (50 mg total) by mouth 2 (two) times daily.  . [DISCONTINUED] furosemide (LASIX) 20 MG tablet Take 1 tablet (20 mg total) by mouth daily.  . [DISCONTINUED] metoprolol succinate (TOPROL-XL) 25 MG 24 hr tablet Take 3 tablets (75 mg total) by mouth daily.  . [DISCONTINUED] potassium chloride (KLOR-CON) 10 MEQ tablet Take 1 tablet (10 mEq total) by mouth daily.    Allergies:   Morphine and related, Penicillins, Lasix [furosemide], Aspirin, Clindamycin/lincomycin, Diltiazem, Erythromycin, Lisinopril, and Penicillins cross reactors   Social History   Socioeconomic History  . Marital status: Widowed    Spouse name: Not on file  . Number of children: Not on file  . Years of education: Not on file  . Highest education level: Not on file  Occupational History  . Not on file  Tobacco Use  . Smoking status: Former Smoker    Quit date: 09/06/1990    Years since quitting: 29.6  . Smokeless tobacco: Never Used  Vaping Use  . Vaping Use: Never used  Substance and Sexual Activity  . Alcohol use: No  . Drug use: No  . Sexual activity: Not on file  Other Topics Concern  . Not on file  Social History Narrative  . Not on file   Social Determinants of Health   Financial Resource Strain:   . Difficulty of Paying Living Expenses: Not on file  Food Insecurity:   . Worried About Charity fundraiser in the Last Year: Not on file  . Ran Out of Food in the Last Year: Not on file  Transportation Needs:   . Lack of Transportation (Medical): Not on file  . Lack of Transportation (Non-Medical): Not on file  Physical Activity:   . Days of Exercise per Week: Not on file  . Minutes of Exercise per Session: Not on file  Stress:   . Feeling of Stress : Not on file  Social Connections:   . Frequency  of Communication with Friends and Family: Not on file  . Frequency of Social Gatherings with Friends and Family: Not on file  . Attends Religious Services: Not on file  . Active Member of Clubs or Organizations: Not on file  . Attends Archivist Meetings: Not on file  . Marital Status: Not on file     Family History:  The patient's family history includes Coronary artery disease in an other family member; Heart disease in her father and son.   ROS:   Please see the history of present illness.    ROS All other systems reviewed and are negative.  PHYSICAL EXAM:   VS:  BP 128/70   Pulse 60   Ht 5\' 3"  (1.6 m)   Wt 192 lb 6.4 oz (87.3 kg)   SpO2  96%   BMI 34.08 kg/m   Physical Exam  GEN: Well nourished, well developed, in no acute distress  Neck: no JVD, carotid bruits, or masses Cardiac:RRR; 2/6 systolic murmur at the left sternal border Respiratory:  clear to auscultation bilaterally, normal work of breathing GI: soft, nontender, nondistended, + BS Ext: Trace of edema without cyanosis, clubbing,  Good distal pulses bilaterally Neuro:  Alert and Oriented x 3 Psych: euthymic mood, full affect  Wt Readings from Last 3 Encounters:  04/13/20 192 lb 6.4 oz (87.3 kg)  12/30/19 190 lb (86.2 kg)  12/20/19 188 lb (85.3 kg)    Studies/Labs Reviewed:   EKG:  EKG is ordered today.   It shows atrial paced rhythm with prolonged AV conduction of 240 ms negative T waves in the inferior anterolateral leads unchanged from prior. This was personally reviewed.  Recent Labs: 09/10/2019: ALT 18 09/16/2019: Magnesium 1.8; TSH 2.240 12/21/2019: BUN 20; Creatinine, Ser 0.83; Hemoglobin 14.6; Platelets 145; Potassium 3.5; Sodium 143   Lipid Panel    Component Value Date/Time   CHOL 193 05/13/2017 1224   TRIG 152 (H) 05/13/2017 1224   HDL 41 05/13/2017 1224   CHOLHDL 4.7 (H) 05/13/2017 1224   CHOLHDL 3.9 11/21/2011 1322   VLDL 25 11/21/2011 1322   LDLCALC 122 (H) 05/13/2017 1224    Additional studies/ records that were reviewed today include:   Lower extremity dopplers 09/10/19 RIGHT:  - No evidence of deep vein thrombosis in the lower extremity. No indirect  evidence of obstruction proximal to the inguinal ligament.    LEFT:  - There is no evidence of deep vein thrombosis in the lower extremity.  - No cystic structure found in the popliteal fossa.      Echo 07/2018   1. The left ventricle has hyperdynamic systolic function of >24%. The cavity size is mildly increased. There is no left ventricular wall thickness. Echo evidence of pseudonormal diastolic filling patterns. Elevated left ventricular end-diastolic  pressure.  2. There is a dynamic mid LV chamber systolic gradient that is 67mmHg at rest and increases to 22mmHg with Valsalva.  3. Mildly dilated left atrial size.  4. Normal right atrial size.  5. The mitral valve normal in structure. There is moderate to severe mitral annular calcification present. Regurgitation is trivial by color flow Doppler.  6. Normal tricuspid valve.  7. Tricuspid regurgitation is mild.  8. The aortic valve tricuspid. There is moderate thickening and moderate calcification of the aortic valve. The calculated aortic valve area is 1.18 cm consistent with mild stenosis.  9. No atrial level shunt detected by color flow Doppler.   FINDINGS  Left Ventricle: Definity contrast agent was given IV to delineate the left ventricular endocardial borders. No evidence of left ventricular regional wall motion abnormalities. The left ventricle has hyperdynamic systolic function of >40%. The cavity  size is mildly increased. There is no left ventricular wall thickness. Echo evidence of pseudonormal diastolic filling patterns. Elevated left ventricular end-diastolic pressure. Right Ventricle: The right ventricle is normal in size. There is normal hypertrophy. There is normal systolic function. Right ventricular systolic pressure is normal with an  estimated pressure of 30.5 mmHg. Left Atrium: The left atrium is mildly dilated. Right Atrium: The right atrial size is normal in size. Interatrial Septum: No atrial level shunt detected by color flow Doppler.   Pericardium: There is no evidence of pericardial effusion. Mitral Valve: The mitral valve normal in structure. There is moderate to severe mitral annular calcification  present. Regurgitation is trivial by color flow Doppler. Tricuspid Valve: The tricuspid valve is normal in structure. Tricuspid regurgitation is mild by color flow Doppler. Aortic Valve: The aortic valve tricuspid. There is moderate thickening and moderate calcification of the aortic valve. Aortic valve regurgitation was not visualized by color flow Doppler. The calculated aortic valve area is 1.18 cm consistent with mild  stenosis. Pulmonic Valve: The pulmonic valve is normal. Pulmonic valve regurgitation is trivial by color flow Doppler. Venous: The inferior vena cava was normal in size with greater than 50% respiratory variablity. Additional Findings: There is a dynamic mid LV chamber systolic gradient that is 71mmHg at rest and increases to 29mmHg with Valsalva.   ASSESSMENT:    1. Aortic valve stenosis, etiology of cardiac valve disease unspecified   2. Paroxysmal atrial fibrillation (HCC)   3. Hypokalemia   4. Essential hypertension, benign   5. Dizziness   6. Pacemaker   7. Moderate aortic stenosis   8. SSS (sick sinus syndrome) (HCC)   9. Chronic diastolic CHF (congestive heart failure) (HCC)     PLAN:   In order of problems listed above:  Acute on Chronic diastolic CHF -with mild lower extremity edema, I will increase Lasix to 40 mg daily.  PAF on flecainide and Pradaxa, now has blood in her urine  Essential hypertension blood pressure controlled, mild episode of orthostatic hypotension no falls.  Moderate aortic stenosis with normal LV function on echo 07/16/2019, she has 5 out of 6 systolic  murmur however S2 still present.  Pacemaker for sick sinus syndrome followed by Dr. Rayann Heman  Dysuria and hematuria -consistent with UTI, will start nitrofurantoin 100 mg p.o. twice daily for 5 days, it was verified with our pharmacist that there is no interaction with flecainide.  Medication Adjustments/Labs and Tests Ordered: Current medicines are reviewed at length with the patient today.  Concerns regarding medicines are outlined above.  Medication changes, Labs and Tests ordered today are listed in the Patient Instructions below. Patient Instructions  Medication Instructions:   START TAKING NITROFURANTOIN (MACROBID) 100 MG BY MOUTH TWICE DAILY FOR 5 DAYS ONLY FOR UTI.  INCREASE YOUR LASIX TO 40 MG BY MOUTH DAILY  *If you need a refill on your cardiac medications before your next appointment, please call your pharmacy*   Testing/Procedures:  Your physician has requested that you have an echocardiogram. Echocardiography is a painless test that uses sound waves to create images of your heart. It provides your doctor with information about the size and shape of your heart and how well your heart's chambers and valves are working. This procedure takes approximately one hour. There are no restrictions for this procedure.  PLEASE SCHEDULE ECHO FOR 5 TO 5 1/2 MONTHS OUT FROM NOW.  Follow-Up: At [facility], you and your health needs are our priority.  As part of our continuing mission to provide you with exceptional heart care, we have created designated Provider Care Teams.  These Care Teams include your primary Cardiologist (physician) and Advanced Practice Providers (APPs -  Physician Assistants and Nurse Practitioners) who all work together to provide you with the care you need, when you need it.  We recommend signing up for the patient portal called "MyChart".  Sign up information is provided on this After Visit Summary.  MyChart is used to connect with patients for Virtual Visits  (Telemedicine).  Patients are able to view lab/test results, encounter notes, upcoming appointments, etc.  Non-urgent messages can be sent to your provider  as well.   To learn more about what you can do with MyChart, go to [email or web address].    Your next appointment:   6 month(s)  The format for your next appointment:   In Person  Provider:   Ena Dawley, MD        Signed, Ena Dawley, MD  04/13/2020 10:46 AM    [address], [place], [place]  [address] Phone: [phone]; Fax: [phone]

## 2020-04-15 ENCOUNTER — Ambulatory Visit (INDEPENDENT_AMBULATORY_CARE_PROVIDER_SITE_OTHER): Payer: Medicare Other

## 2020-04-15 DIAGNOSIS — I495 Sick sinus syndrome: Secondary | ICD-10-CM

## 2020-04-15 LAB — CUP PACEART REMOTE DEVICE CHECK
Battery Remaining Longevity: 125 mo
Battery Remaining Percentage: 95.5 %
Battery Voltage: 2.99 V
Brady Statistic AP VP Percent: 3.7 %
Brady Statistic AP VS Percent: 96 %
Brady Statistic AS VP Percent: 1 %
Brady Statistic AS VS Percent: 1 %
Brady Statistic RA Percent Paced: 99 %
Brady Statistic RV Percent Paced: 3.7 %
Date Time Interrogation Session: 20211105040015
Implantable Lead Implant Date: 20091109
Implantable Lead Implant Date: 20091109
Implantable Lead Location: 753859
Implantable Lead Location: 753860
Implantable Lead Model: 350
Implantable Lead Serial Number: 24891460
Implantable Lead Serial Number: 28411861
Implantable Pulse Generator Implant Date: 20170227
Lead Channel Impedance Value: 1400 Ohm
Lead Channel Impedance Value: 450 Ohm
Lead Channel Pacing Threshold Amplitude: 0.75 V
Lead Channel Pacing Threshold Amplitude: 1 V
Lead Channel Pacing Threshold Pulse Width: 0.4 ms
Lead Channel Pacing Threshold Pulse Width: 0.4 ms
Lead Channel Sensing Intrinsic Amplitude: 0.9 mV
Lead Channel Sensing Intrinsic Amplitude: 12 mV
Lead Channel Setting Pacing Amplitude: 2 V
Lead Channel Setting Pacing Amplitude: 2.5 V
Lead Channel Setting Pacing Pulse Width: 0.4 ms
Lead Channel Setting Sensing Sensitivity: 2 mV
Pulse Gen Model: 2240
Pulse Gen Serial Number: 7885781

## 2020-04-15 NOTE — Progress Notes (Signed)
Remote pacemaker transmission.   

## 2020-04-19 DIAGNOSIS — Z23 Encounter for immunization: Secondary | ICD-10-CM | POA: Diagnosis not present

## 2020-07-15 ENCOUNTER — Ambulatory Visit (INDEPENDENT_AMBULATORY_CARE_PROVIDER_SITE_OTHER): Payer: Medicare Other

## 2020-07-15 DIAGNOSIS — I495 Sick sinus syndrome: Secondary | ICD-10-CM | POA: Diagnosis not present

## 2020-07-15 LAB — CUP PACEART REMOTE DEVICE CHECK
Battery Remaining Longevity: 125 mo
Battery Remaining Percentage: 95.5 %
Battery Voltage: 2.99 V
Brady Statistic AP VP Percent: 3.3 %
Brady Statistic AP VS Percent: 96 %
Brady Statistic AS VP Percent: 1 %
Brady Statistic AS VS Percent: 1 %
Brady Statistic RA Percent Paced: 99 %
Brady Statistic RV Percent Paced: 3.4 %
Date Time Interrogation Session: 20220204040014
Implantable Lead Implant Date: 20091109
Implantable Lead Implant Date: 20091109
Implantable Lead Location: 753859
Implantable Lead Location: 753860
Implantable Lead Model: 350
Implantable Lead Serial Number: 24891460
Implantable Lead Serial Number: 28411861
Implantable Pulse Generator Implant Date: 20170227
Lead Channel Impedance Value: 1375 Ohm
Lead Channel Impedance Value: 460 Ohm
Lead Channel Pacing Threshold Amplitude: 0.75 V
Lead Channel Pacing Threshold Amplitude: 1 V
Lead Channel Pacing Threshold Pulse Width: 0.4 ms
Lead Channel Pacing Threshold Pulse Width: 0.4 ms
Lead Channel Sensing Intrinsic Amplitude: 12 mV
Lead Channel Sensing Intrinsic Amplitude: 2 mV
Lead Channel Setting Pacing Amplitude: 2 V
Lead Channel Setting Pacing Amplitude: 2.5 V
Lead Channel Setting Pacing Pulse Width: 0.4 ms
Lead Channel Setting Sensing Sensitivity: 2 mV
Pulse Gen Model: 2240
Pulse Gen Serial Number: 7885781

## 2020-07-18 DIAGNOSIS — H18511 Endothelial corneal dystrophy, right eye: Secondary | ICD-10-CM | POA: Diagnosis not present

## 2020-07-19 NOTE — Progress Notes (Signed)
Remote pacemaker transmission.   

## 2020-08-02 ENCOUNTER — Other Ambulatory Visit: Payer: Self-pay | Admitting: Family Medicine

## 2020-08-02 DIAGNOSIS — I350 Nonrheumatic aortic (valve) stenosis: Secondary | ICD-10-CM | POA: Diagnosis not present

## 2020-08-02 DIAGNOSIS — Z950 Presence of cardiac pacemaker: Secondary | ICD-10-CM | POA: Diagnosis not present

## 2020-08-02 DIAGNOSIS — I480 Paroxysmal atrial fibrillation: Secondary | ICD-10-CM | POA: Diagnosis not present

## 2020-08-02 DIAGNOSIS — Z0000 Encounter for general adult medical examination without abnormal findings: Secondary | ICD-10-CM | POA: Diagnosis not present

## 2020-08-02 DIAGNOSIS — R221 Localized swelling, mass and lump, neck: Secondary | ICD-10-CM | POA: Diagnosis not present

## 2020-08-02 DIAGNOSIS — Z1389 Encounter for screening for other disorder: Secondary | ICD-10-CM | POA: Diagnosis not present

## 2020-08-02 DIAGNOSIS — I10 Essential (primary) hypertension: Secondary | ICD-10-CM | POA: Diagnosis not present

## 2020-08-02 DIAGNOSIS — E039 Hypothyroidism, unspecified: Secondary | ICD-10-CM | POA: Diagnosis not present

## 2020-08-02 DIAGNOSIS — I495 Sick sinus syndrome: Secondary | ICD-10-CM | POA: Diagnosis not present

## 2020-08-11 ENCOUNTER — Ambulatory Visit
Admission: RE | Admit: 2020-08-11 | Discharge: 2020-08-11 | Disposition: A | Discharge status: Home / Self Care | Payer: Medicare Other | Source: Ambulatory Visit | Attending: Family Medicine | Admitting: Family Medicine

## 2020-08-11 DIAGNOSIS — R221 Localized swelling, mass and lump, neck: Secondary | ICD-10-CM

## 2020-08-28 IMAGING — RF DG ESOPHAGUS
8 series · 14 of 24 positions shown · non-contrast
Comparison: None.

CLINICAL DATA: Dysphagia. Episode of food getting caught in throat.

EXAM:
ESOPHOGRAM / BARIUM SWALLOW / BARIUM TABLET STUDY
TECHNIQUE: Combined double contrast and single contrast examination performed
using effervescent crystals, thick barium liquid, and thin barium
liquid. The patient was observed with fluoroscopy swallowing a 13 mm
barium sulphate tablet.
FLUOROSCOPY TIME:  Fluoroscopy Time:  1 minutes and 18 seconds.
Radiation Exposure Index (if provided by the fluoroscopic device):
74 mGy
Number of Acquired Spot Images: 0

[Series 1: sequence · 0.28mm/px · 2 of 14 frames shown (1 of 7)]
[frame 3/14]
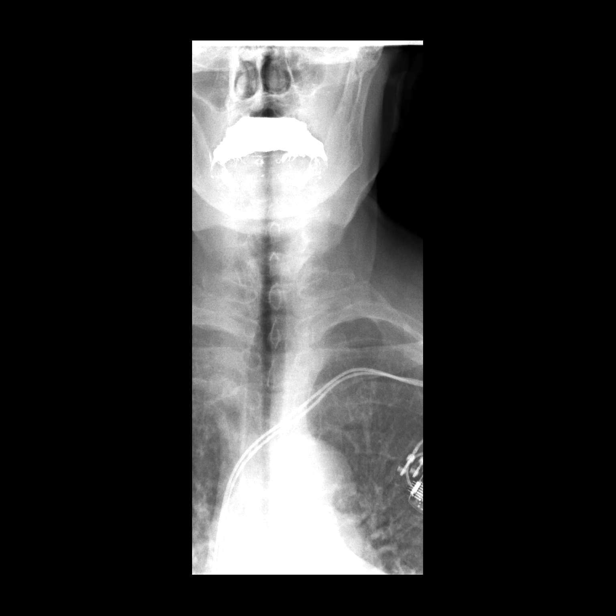
[frame 9/14]
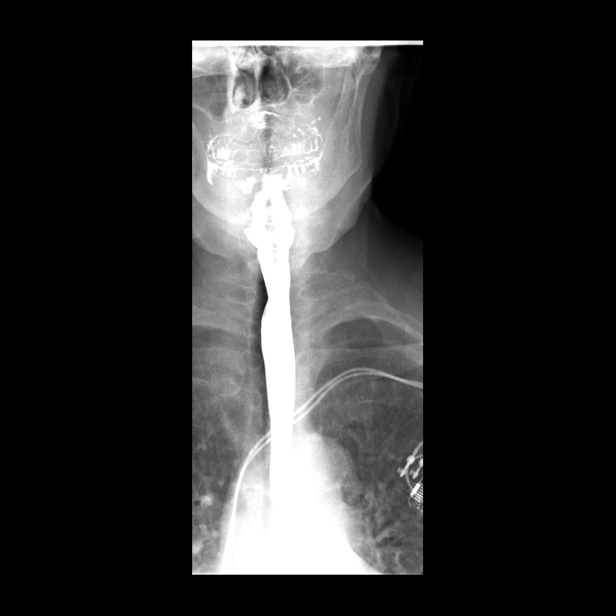

[Series 2: sequence · 0.28mm/px · 2 of 12 frames shown (2 of 7)]
[frame 7/12]
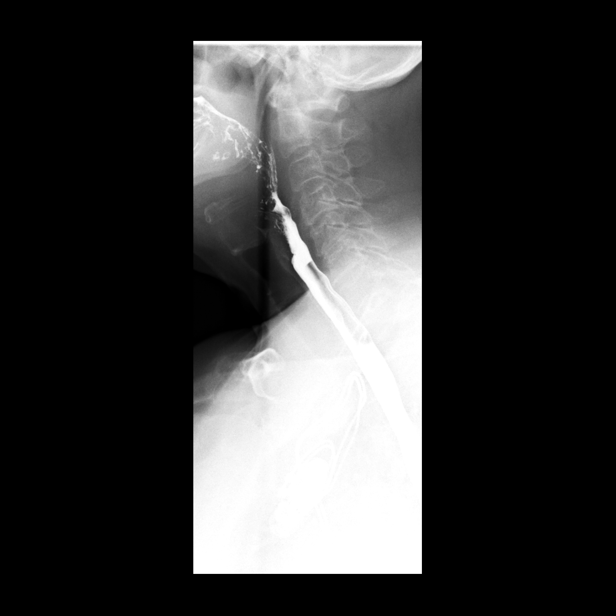
[frame 12/12]
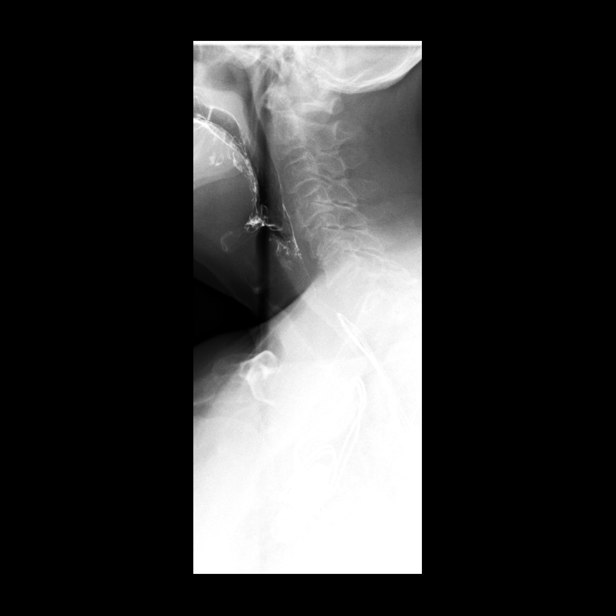

[Series 3: sequence · 0.28mm/px · 1 of 26 frames shown (3 of 7)]
[frame 4/26]
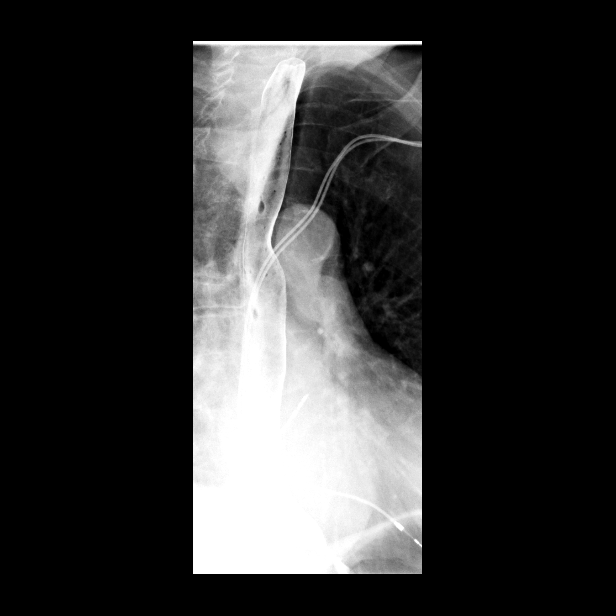

[Series 4: sequence · 0.28mm/px · 3 of 20 frames shown (4 of 7)]
[frame 4/20]
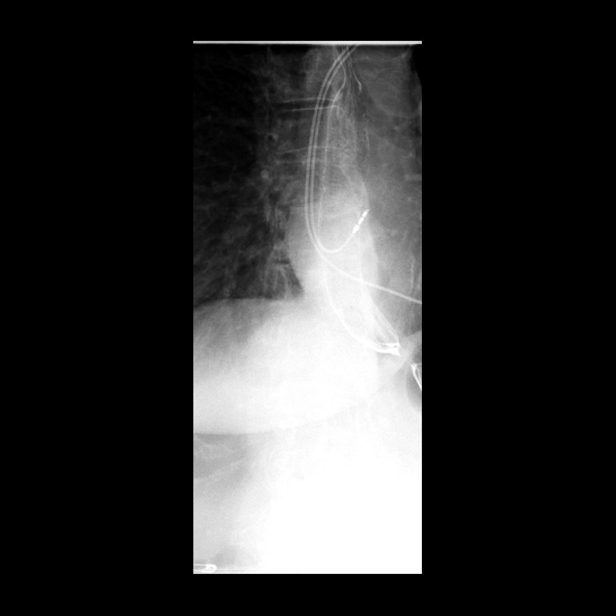
[frame 18/20]
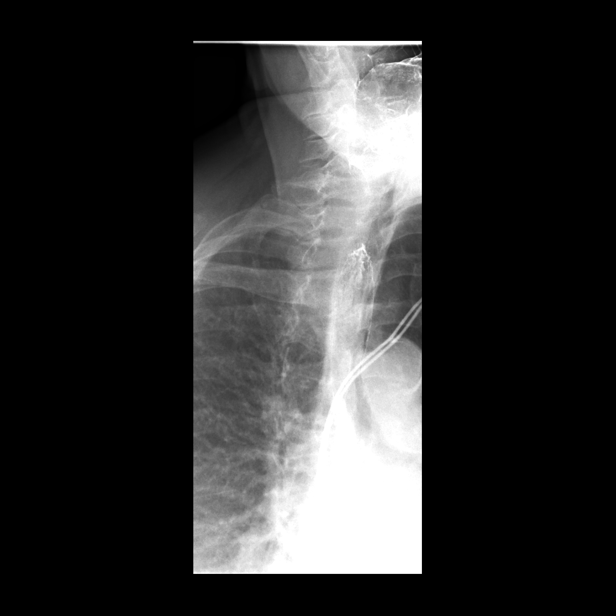
[frame 19/20]
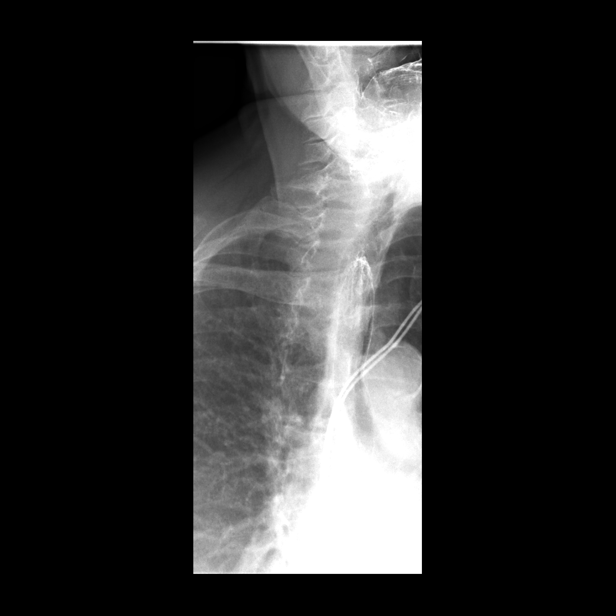

[Series 5: sequence · 1 of 36 frames shown (5 of 7)]
[frame 6/36]
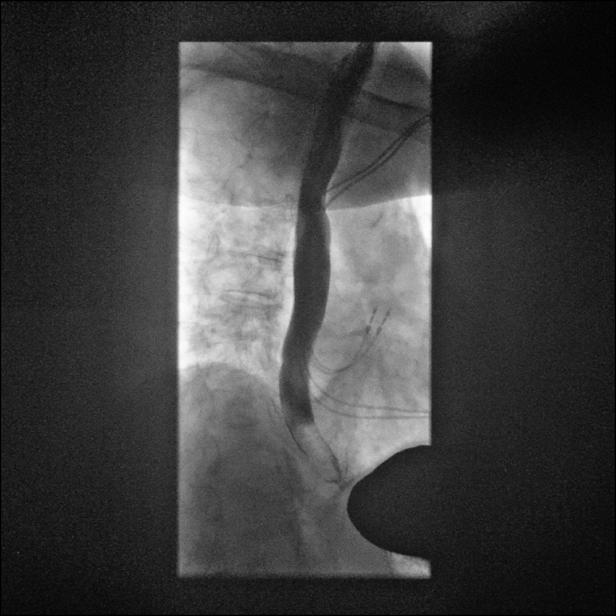

[Series 6: sequence · 3 of 37 frames shown (6 of 7)]
[frame 1/37]
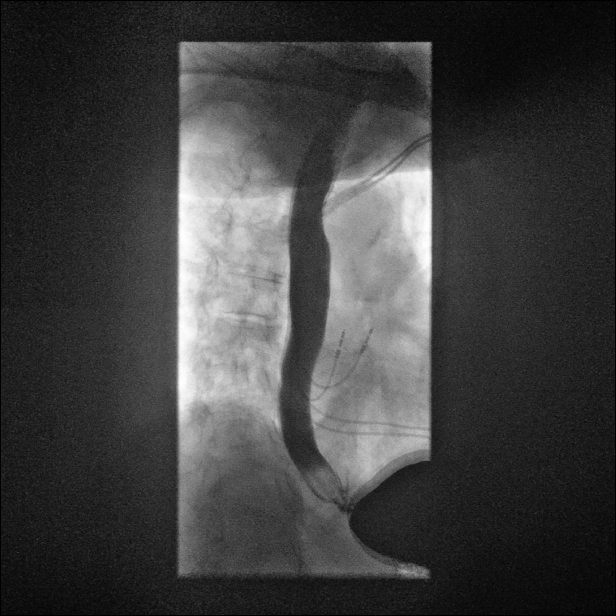
[frame 19/37]
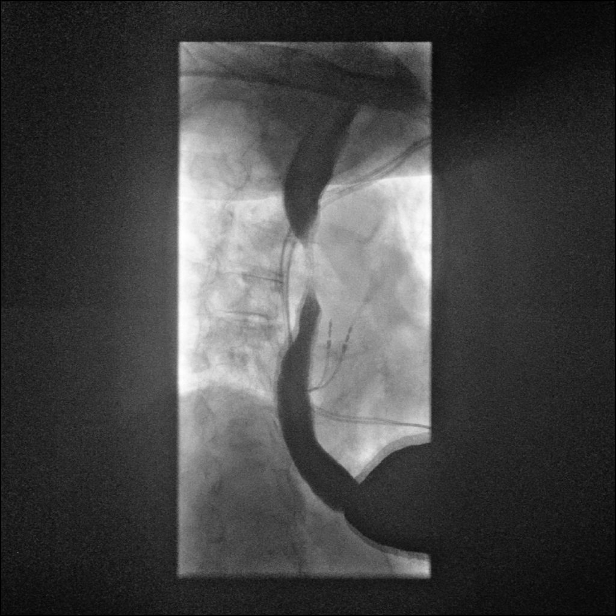
[frame 32/37]
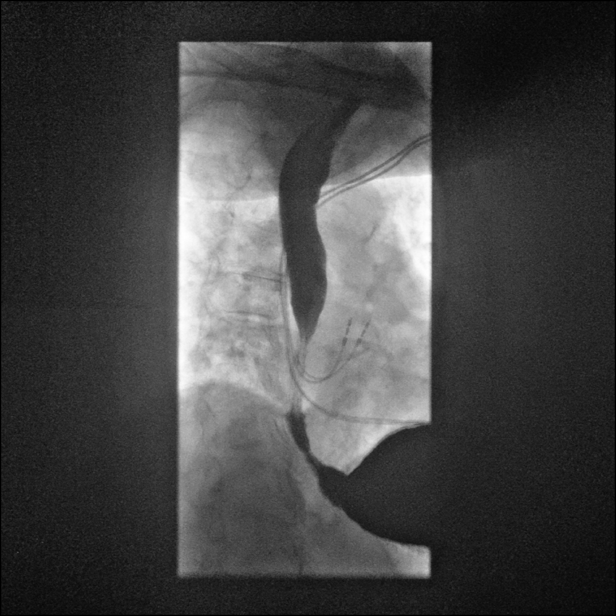

[Series 7: sequence · 1 of 37 frames shown (7 of 7)]
[frame 19/37]
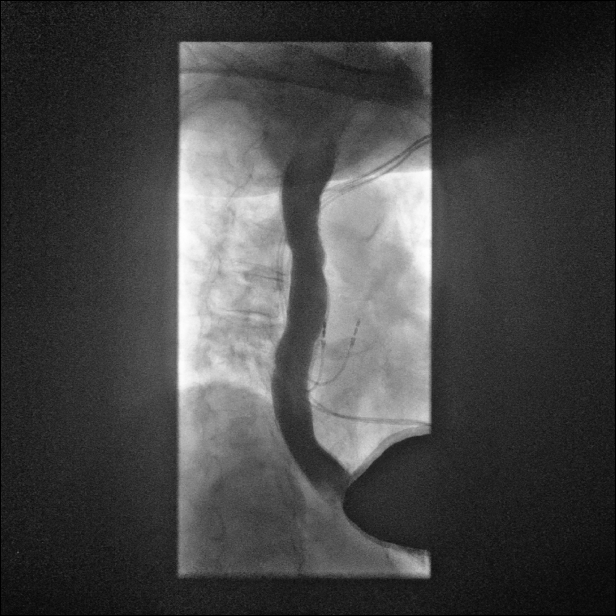

[Series 8: one shot · 1 of 1 slices shown]
[im 1/1]
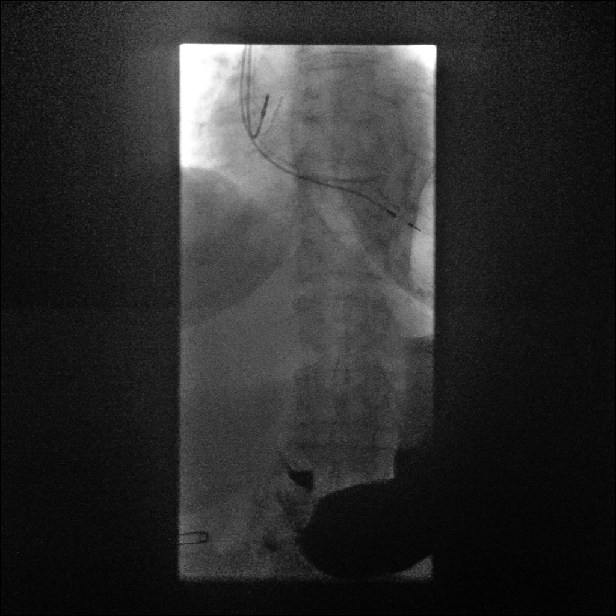

[14 of 24 positions shown; findings below may reference images not displayed]

FINDINGS: Frontal and lateral views of the hypopharynx while swallowing are
normal.

Double contrast imaging of the esophagus shows no focal mass lesion,
stricture, diverticulum, or gross mucosal ulceration.

Assessment of esophageal motility reveals complete absence of
primary peristalsis on all swallows. No tertiary contractions
observed. There is no evidence of presbyesophagus.

13 mm barium tablet passes readily into the stomach when taken with
water.
IMPRESSION: 1. Nonspecific esophageal motility disorder. Otherwise unremarkable
exam.

## 2020-09-20 ENCOUNTER — Telehealth: Payer: Self-pay | Admitting: *Deleted

## 2020-09-20 ENCOUNTER — Ambulatory Visit (HOSPITAL_COMMUNITY): Payer: Medicare Other | Attending: Internal Medicine

## 2020-09-20 ENCOUNTER — Other Ambulatory Visit: Payer: Self-pay

## 2020-09-20 DIAGNOSIS — I350 Nonrheumatic aortic (valve) stenosis: Secondary | ICD-10-CM | POA: Diagnosis not present

## 2020-09-20 LAB — ECHOCARDIOGRAM COMPLETE
AR max vel: 1.4 cm2
AV Area VTI: 1.27 cm2
AV Area mean vel: 1.3 cm2
AV Mean grad: 18 mmHg
AV Peak grad: 32.9 mmHg
Ao pk vel: 2.87 m/s
Area-P 1/2: 2.83 cm2
S' Lateral: 2.5 cm

## 2020-09-20 NOTE — Telephone Encounter (Signed)
-----   Message from Freada Bergeron, MD sent at 09/20/2020  5:02 PM EDT ----- Echo shows that her pumping function is normal. She continues to have moderate narrowing of her aortic valve which appears similar to her prior echoes. We will continue to monitor this every year to ensure it stays stable. We can image sooner if she develops symptoms such as worsening SOB, chest pain, fatigue or LE swelling.

## 2020-09-20 NOTE — Telephone Encounter (Signed)
Spoke with the pt and made her aware of her echo results and recommendations per Dr. Johney Frame, for her to have another echo repeated in one year, and we can move this test up as needed, if she develops worsening sob, chest pain, fatigue, and increase LEE, in the interim.  Informed the pt that I will go ahead and place the order for the repeat echo in the system for one year out, and have our Echo Scheduler call her to arrange this.  Pt verbalized understanding and agrees with this plan.

## 2020-09-25 ENCOUNTER — Emergency Department (HOSPITAL_COMMUNITY): Payer: Medicare Other

## 2020-09-25 ENCOUNTER — Other Ambulatory Visit: Payer: Self-pay

## 2020-09-25 ENCOUNTER — Encounter (HOSPITAL_COMMUNITY): Payer: Self-pay | Admitting: Emergency Medicine

## 2020-09-25 ENCOUNTER — Emergency Department (HOSPITAL_COMMUNITY)
Admission: EM | Admit: 2020-09-25 | Discharge: 2020-09-25 | Disposition: A | Discharge status: Home / Self Care | Payer: Medicare Other | Attending: Emergency Medicine | Admitting: Emergency Medicine

## 2020-09-25 DIAGNOSIS — S0083XA Contusion of other part of head, initial encounter: Secondary | ICD-10-CM | POA: Insufficient documentation

## 2020-09-25 DIAGNOSIS — Z87891 Personal history of nicotine dependence: Secondary | ICD-10-CM | POA: Insufficient documentation

## 2020-09-25 DIAGNOSIS — S0993XA Unspecified injury of face, initial encounter: Secondary | ICD-10-CM | POA: Diagnosis not present

## 2020-09-25 DIAGNOSIS — J9811 Atelectasis: Secondary | ICD-10-CM | POA: Diagnosis not present

## 2020-09-25 DIAGNOSIS — M25559 Pain in unspecified hip: Secondary | ICD-10-CM | POA: Diagnosis not present

## 2020-09-25 DIAGNOSIS — R079 Chest pain, unspecified: Secondary | ICD-10-CM | POA: Diagnosis not present

## 2020-09-25 DIAGNOSIS — I10 Essential (primary) hypertension: Secondary | ICD-10-CM | POA: Diagnosis not present

## 2020-09-25 DIAGNOSIS — Z79899 Other long term (current) drug therapy: Secondary | ICD-10-CM | POA: Diagnosis not present

## 2020-09-25 DIAGNOSIS — Z20822 Contact with and (suspected) exposure to covid-19: Secondary | ICD-10-CM | POA: Insufficient documentation

## 2020-09-25 DIAGNOSIS — S3993XA Unspecified injury of pelvis, initial encounter: Secondary | ICD-10-CM | POA: Diagnosis not present

## 2020-09-25 DIAGNOSIS — E039 Hypothyroidism, unspecified: Secondary | ICD-10-CM | POA: Insufficient documentation

## 2020-09-25 DIAGNOSIS — G9389 Other specified disorders of brain: Secondary | ICD-10-CM | POA: Diagnosis not present

## 2020-09-25 DIAGNOSIS — T1490XA Injury, unspecified, initial encounter: Secondary | ICD-10-CM

## 2020-09-25 DIAGNOSIS — Z950 Presence of cardiac pacemaker: Secondary | ICD-10-CM | POA: Diagnosis not present

## 2020-09-25 DIAGNOSIS — Y92129 Unspecified place in nursing home as the place of occurrence of the external cause: Secondary | ICD-10-CM | POA: Insufficient documentation

## 2020-09-25 DIAGNOSIS — M542 Cervicalgia: Secondary | ICD-10-CM | POA: Diagnosis not present

## 2020-09-25 DIAGNOSIS — W01198A Fall on same level from slipping, tripping and stumbling with subsequent striking against other object, initial encounter: Secondary | ICD-10-CM | POA: Diagnosis not present

## 2020-09-25 DIAGNOSIS — Z96651 Presence of right artificial knee joint: Secondary | ICD-10-CM | POA: Diagnosis not present

## 2020-09-25 DIAGNOSIS — M47816 Spondylosis without myelopathy or radiculopathy, lumbar region: Secondary | ICD-10-CM | POA: Diagnosis not present

## 2020-09-25 DIAGNOSIS — S0990XA Unspecified injury of head, initial encounter: Secondary | ICD-10-CM | POA: Diagnosis not present

## 2020-09-25 LAB — I-STAT CHEM 8, ED
BUN: 27 mg/dL — ABNORMAL HIGH (ref 8–23)
Calcium, Ion: 1.22 mmol/L (ref 1.15–1.40)
Chloride: 107 mmol/L (ref 98–111)
Creatinine, Ser: 1.1 mg/dL — ABNORMAL HIGH (ref 0.44–1.00)
Glucose, Bld: 101 mg/dL — ABNORMAL HIGH (ref 70–99)
HCT: 45 % (ref 36.0–46.0)
Hemoglobin: 15.3 g/dL — ABNORMAL HIGH (ref 12.0–15.0)
Potassium: 3.7 mmol/L (ref 3.5–5.1)
Sodium: 144 mmol/L (ref 135–145)
TCO2: 27 mmol/L (ref 22–32)

## 2020-09-25 LAB — COMPREHENSIVE METABOLIC PANEL
ALT: 20 U/L (ref 0–44)
AST: 32 U/L (ref 15–41)
Albumin: 3.7 g/dL (ref 3.5–5.0)
Alkaline Phosphatase: 48 U/L (ref 38–126)
Anion gap: 6 (ref 5–15)
BUN: 24 mg/dL — ABNORMAL HIGH (ref 8–23)
CO2: 29 mmol/L (ref 22–32)
Calcium: 9.5 mg/dL (ref 8.9–10.3)
Chloride: 107 mmol/L (ref 98–111)
Creatinine, Ser: 1.09 mg/dL — ABNORMAL HIGH (ref 0.44–1.00)
GFR, Estimated: 48 mL/min — ABNORMAL LOW (ref >60)
Glucose, Bld: 102 mg/dL — ABNORMAL HIGH (ref 70–99)
Potassium: 3.8 mmol/L (ref 3.5–5.1)
Sodium: 142 mmol/L (ref 135–145)
Total Bilirubin: 0.5 mg/dL (ref 0.3–1.2)
Total Protein: 7.2 g/dL (ref 6.5–8.1)

## 2020-09-25 LAB — CBC
HCT: 46.4 % — ABNORMAL HIGH (ref 36.0–46.0)
Hemoglobin: 15.2 g/dL — ABNORMAL HIGH (ref 12.0–15.0)
MCH: 32.2 pg (ref 26.0–34.0)
MCHC: 32.8 g/dL (ref 30.0–36.0)
MCV: 98.3 fL (ref 80.0–100.0)
Platelets: 144 10*3/uL — ABNORMAL LOW (ref 150–400)
RBC: 4.72 MIL/uL (ref 3.87–5.11)
RDW: 12.5 % (ref 11.5–15.5)
WBC: 7.3 10*3/uL (ref 4.0–10.5)
nRBC: 0 % (ref 0.0–0.2)

## 2020-09-25 LAB — SAMPLE TO BLOOD BANK

## 2020-09-25 LAB — LACTIC ACID, PLASMA: Lactic Acid, Venous: 1.4 mmol/L (ref 0.5–1.9)

## 2020-09-25 LAB — ETHANOL: Alcohol, Ethyl (B): <10 mg/dL (ref <10)

## 2020-09-25 LAB — PROTIME-INR
INR: 1.4 — ABNORMAL HIGH (ref 0.8–1.2)
Prothrombin Time: 17.6 seconds — ABNORMAL HIGH (ref 11.4–15.2)

## 2020-09-25 LAB — RESP PANEL BY RT-PCR (FLU A&B, COVID) ARPGX2
Influenza A by PCR: NEGATIVE
Influenza B by PCR: NEGATIVE
SARS Coronavirus 2 by RT PCR: NEGATIVE

## 2020-09-25 MED ORDER — ACETAMINOPHEN 500 MG PO TABS
1000.0000 mg | ORAL_TABLET | Freq: Once | ORAL | Status: AC
  Administered 2020-09-25: 1000 mg via ORAL
  Filled 2020-09-25: qty 2
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
## 2020-09-25 NOTE — ED Provider Notes (Signed)
I have personally seen and examined the patient. I have reviewed the documentation on PMH/FH/Soc Hx. I have discussed the plan of care with the resident and patient.  I have reviewed and agree with the resident's documentation. Please see associated encounter note.  Briefly, the patient is a 87 y.o. female here with mechanical fall.  Bruising and swelling to the right eye.  No signs of entrapment on exam.  Neurologically intact.  Patient on Pradaxa.  Level 2 trauma.  Lab work unremarkable.  No pneumothorax.  No pelvic fracture.  Will get head CT, face CT, ct neck.  Overall suspect may be an orbital wall fracture.  There is no traumatic pupil on exam.  Please read my resident's note for further results, evaluation and disposition of the patient.  Suspect that if imaging is normal she is okay for discharge to home.  This chart was dictated using voice recognition software.  Despite best efforts to proofread,  errors can occur which can change the documentation meaning.     EKG Interpretation None         Lennice Sites, DO 09/25/20 2153

## 2020-09-25 NOTE — ED Notes (Signed)
Patient transported to CT 

## 2020-09-25 NOTE — ED Notes (Signed)
Patient back from CT.

## 2020-10-14 ENCOUNTER — Ambulatory Visit (INDEPENDENT_AMBULATORY_CARE_PROVIDER_SITE_OTHER): Payer: Medicare Other

## 2020-10-14 DIAGNOSIS — I495 Sick sinus syndrome: Secondary | ICD-10-CM | POA: Diagnosis not present

## 2020-10-14 LAB — CUP PACEART REMOTE DEVICE CHECK
Battery Remaining Longevity: 126 mo
Battery Remaining Percentage: 95.5 %
Battery Voltage: 2.98 V
Brady Statistic AP VP Percent: 3.5 %
Brady Statistic AP VS Percent: 96 %
Brady Statistic AS VP Percent: 1 %
Brady Statistic AS VS Percent: 1 %
Brady Statistic RA Percent Paced: 99 %
Brady Statistic RV Percent Paced: 3.6 %
Date Time Interrogation Session: 20220506040012
Implantable Lead Implant Date: 20091109
Implantable Lead Implant Date: 20091109
Implantable Lead Location: 753859
Implantable Lead Location: 753860
Implantable Lead Model: 350
Implantable Lead Serial Number: 24891460
Implantable Lead Serial Number: 28411861
Implantable Pulse Generator Implant Date: 20170227
Lead Channel Impedance Value: 1375 Ohm
Lead Channel Impedance Value: 490 Ohm
Lead Channel Pacing Threshold Amplitude: 0.75 V
Lead Channel Pacing Threshold Amplitude: 1 V
Lead Channel Pacing Threshold Pulse Width: 0.4 ms
Lead Channel Pacing Threshold Pulse Width: 0.4 ms
Lead Channel Sensing Intrinsic Amplitude: 12 mV
Lead Channel Sensing Intrinsic Amplitude: 2.2 mV
Lead Channel Setting Pacing Amplitude: 2 V
Lead Channel Setting Pacing Amplitude: 2.5 V
Lead Channel Setting Pacing Pulse Width: 0.4 ms
Lead Channel Setting Sensing Sensitivity: 2 mV
Pulse Gen Model: 2240
Pulse Gen Serial Number: 7885781

## 2020-10-17 ENCOUNTER — Other Ambulatory Visit: Payer: Self-pay | Admitting: Physician Assistant

## 2020-10-19 NOTE — Progress Notes (Deleted)
Cardiology Office Note:    Date:  10/19/2020   ID:  [name], DOB [birth year], MRN [id number]  PCP:  Aretta Nip, MD   [place] [facility] Cardiologist:  Ena Dawley, MD (Inactive) Electrophysiologist:  Thompson Grayer, MD {    Referring MD: Aretta Nip, MD    History of Present Illness:    [name] is a 87 y.o. female with a hx of paroxysmal atrial fibrillation on flecainide and Pradaxa, bradycardia tachysyndrome status post pacemaker in 2009 with generator change to [facility] dual-chamber 2017, hypertension, hypothyroidism, and chronic diastolic CHF who was previously followed by Dr. Meda [name] who now presents to clinic for follow-up.  Last seen by Dr. Meda [name] on 04/13/20 where she was having mild LE edema as well as intermittent episodes of dizziness  Past Medical History:  Diagnosis Date  . Aortic stenosis 03/08/2016  . Brady-tachy syndrome ([place])    a. Biotronik dual chamber PPM implanted 2009 b. gen change to STJ dual chamber PPM 2017  . Ejection fraction    EF 70%, echo, October, 2009  //   EF 55-60%, septal dyssynergy consistent with a paced rhythm, mild to moderate mitral regurgitation, echo, November, 2015   . GERD (gastroesophageal reflux disease) 04/28/2014   Episodes November, 2015 with fluid refluxing from her esophagus.   . Hair loss    Patient questioned Coumadin, changed toPradaxa  . Hypertension   . Hypothyroidism   . Lower extremity edema 01/22/2018  . Mitral regurgitation 05/03/2014   Mild-to-moderate mitral regurgitation, echo, November, 2015   . Osteoarthritis of left knee 10/25/2017  . Paroxysmal atrial fibrillation (HCC)    Episodes rapid atrial fib noted by pacemaker interrogation, August, 2011, diltiazem added, patient improved. Patient continues on Rythmol  //   Changed to flecainide 2013  //   flecainide level checked in 2013, good level   . Persistent atrial fibrillation [facility])     Past Surgical History:  Procedure  Laterality Date  . ABDOMINAL HYSTERECTOMY    . EP IMPLANTABLE DEVICE N/A 08/08/2015   Generator change with SJM Assurity DR PPM by Dr Rayann Heman  . JOINT REPLACEMENT    . PACEMAKER PLACEMENT  2009        Current Medications: No outpatient medications have been marked as taking for the 10/20/20 encounter (Appointment) with Freada Bergeron, MD.     Allergies:   Morphine and related, Penicillins, Lasix [furosemide], Aspirin, Clindamycin/lincomycin, Crestor [rosuvastatin], Diltiazem, Erythromycin, Lisinopril, Metronidazole, and Penicillins cross reactors   Social History   Socioeconomic History  . Marital status: Widowed    Spouse name: Not on file  . Number of children: Not on file  . Years of education: Not on file  . Highest education level: Not on file  Occupational History  . Not on file  Tobacco Use  . Smoking status: Former Smoker    Quit date: 09/06/1990    Years since quitting: 30.1  . Smokeless tobacco: Never Used  Vaping Use  . Vaping Use: Never used  Substance and Sexual Activity  . Alcohol use: No  . Drug use: No  . Sexual activity: Not on file  Other Topics Concern  . Not on file  Social History Narrative  . Not on file   Social Determinants of Health   Financial Resource Strain: Not on file  Food Insecurity: Not on file  Transportation Needs: Not on file  Physical Activity: Not on file  Stress: Not on file  Social Connections: Not  on file     Family History: The patient's ***family history includes Coronary artery disease in an other family member; Heart disease in her father and son.  ROS:   Please see the history of present illness.    *** All other systems reviewed and are negative.  EKGs/Labs/Other Studies Reviewed:    The following studies were reviewed today: TTE 10-03-2020: IMPRESSIONS  1. Left ventricular ejection fraction, by estimation, is 65 to 70%. The  left ventricle has normal function. The left ventricle has no regional  wall  motion abnormalities. There is moderate left ventricular hypertrophy.  Left ventricular diastolic  parameters are consistent with Grade I diastolic dysfunction (impaired  relaxation). Elevated left ventricular end-diastolic pressure.  2. Right ventricular systolic function is normal. The right ventricular  size is normal. There is normal pulmonary artery systolic pressure.  3. Left atrial size was moderately dilated.  4. The mitral valve is abnormal. Trivial mitral valve regurgitation.  Moderate mitral annular calcification.  5. The aortic valve is tricuspid. Aortic valve regurgitation is not  visualized. Moderate aortic valve stenosis. Aortic valve area, by VTI  measures 1.27 cm. Aortic valve mean gradient measures 18.0 mmHg. Aortic  valve Vmax measures 2.87 m/s. DI is 0.41.  6. The inferior vena cava is normal in size with greater than 50%  respiratory variability, suggesting right atrial pressure of 3 mmHg.   Lower extremity dopplers 09/10/19 RIGHT:  - No evidence of deep vein thrombosis in the lower extremity. No indirect  evidence of obstruction proximal to the inguinal ligament.   LEFT:  - There is no evidence of deep vein thrombosis in the lower extremity.  - No cystic structure found in the popliteal fossa.   Echo 07/2018 1. The left ventricle has hyperdynamic systolic function of >30%. The cavity size is mildly increased. There is no left ventricular wall thickness. Echo evidence of pseudonormal diastolic filling patterns. Elevated left ventricular end-diastolic  pressure. 2. There is a dynamic mid LV chamber systolic gradient that is 55mmHg at rest and increases to 52mmHg with Valsalva. 3. Mildly dilated left atrial size. 4. Normal right atrial size. 5. The mitral valve normal in structure. There is moderate to severe mitral annular calcification present. Regurgitation is trivial by color flow Doppler. 6. Normal tricuspid valve. 7. Tricuspid regurgitation is  mild. 8. The aortic valve tricuspid. There is moderate thickening and moderate calcification of the aortic valve. The calculated aortic valve area is 1.18 cm consistent with mild stenosis. 9. No atrial level shunt detected by color flow Doppler.  FINDINGS Left Ventricle: Definity contrast agent was given IV to delineate the left ventricular endocardial borders. No evidence of left ventricular regional wall motion abnormalities. The left ventricle has hyperdynamic systolic function of >16%. The cavity  size is mildly increased. There is no left ventricular wall thickness. Echo evidence of pseudonormal diastolic filling patterns. Elevated left ventricular end-diastolic pressure. Right Ventricle: The right ventricle is normal in size. There is normal hypertrophy. There is normal systolic function. Right ventricular systolic pressure is normal with an estimated pressure of 30.5 mmHg. Left Atrium: The left atrium is mildly dilated. Right Atrium: The right atrial size is normal in size. Interatrial Septum: No atrial level shunt detected by color flow Doppler.  Pericardium: There is no evidence of pericardial effusion. Mitral Valve: The mitral valve normal in structure. There is moderate to severe mitral annular calcification present. Regurgitation is trivial by color flow Doppler. Tricuspid Valve: The tricuspid valve is normal in structure.  Tricuspid regurgitation is mild by color flow Doppler. Aortic Valve: The aortic valve tricuspid. There is moderate thickening and moderate calcification of the aortic valve. Aortic valve regurgitation was not visualized by color flow Doppler. The calculated aortic valve area is 1.18 cm consistent with mild  stenosis. Pulmonic Valve: The pulmonic valve is normal. Pulmonic valve regurgitation is trivial by color flow Doppler. Venous: The inferior vena cava was normal in size with greater than 50% respiratory variablity. Additional Findings: There is a dynamic  mid LV chamber systolic gradient that is 68mmHg at rest and increases to 16mmHg with Valsalva.  EKG:  EKG is *** ordered today.  The ekg ordered today demonstrates ***  Recent Labs: 09/25/2020: ALT 20; BUN 27; Creatinine, Ser 1.10; Hemoglobin 15.3; Platelets 144; Potassium 3.7; Sodium 144  Recent Lipid Panel    Component Value Date/Time   CHOL 193 05/13/2017 1224   TRIG 152 (H) 05/13/2017 1224   HDL 41 05/13/2017 1224   CHOLHDL 4.7 (H) 05/13/2017 1224   CHOLHDL 3.9 11/21/2011 1322   VLDL 25 11/21/2011 1322   LDLCALC 122 (H) 05/13/2017 1224     Risk Assessment/Calculations:   {Does this patient have ATRIAL FIBRILLATION?:[phone]}   Physical Exam:    VS:  There were no vitals taken for this visit.    Wt Readings from Last 3 Encounters:  09/25/20 198 lb 6.6 oz (90 kg)  04/13/20 192 lb 6.4 oz (87.3 kg)  12/30/19 190 lb (86.2 kg)     GEN: *** Well nourished, well developed in no acute distress HEENT: Normal NECK: No JVD; No carotid bruits LYMPHATICS: No lymphadenopathy CARDIAC: ***RRR, no murmurs, rubs, gallops RESPIRATORY:  Clear to auscultation without rales, wheezing or rhonchi  ABDOMEN: Soft, non-tender, non-distended MUSCULOSKELETAL:  No edema; No deformity  SKIN: Warm and dry NEUROLOGIC:  Alert and oriented x 3 PSYCHIATRIC:  Normal affect   ASSESSMENT:    No diagnosis found. PLAN:    In order of problems listed above:  #Acute on chronic HF: TTE with LVEF 65-70%, moderate LVH, G1DD.  -Continue metop 25mg  XL daily -Continue lasix 40mg  daily -Low Na dieta  #Paroxysmal Afib: -Continue flecainide 50mg  BID -Continu metop 25mg  XL daily -? Change pradaxa to apixaban  #HTN: -Continue metop 25mg  XL daily  #Moderate AS: Stable on TTE with mean gradient 52mmHg, Vmax 2.17m/s, DI 0.41. -Serial monitoring with TTE with next in 1 year  #SSS s/p PPM placement: Followed by Dr. Darrol Jump   {Are you ordering a CV Procedure (e.g. stress test, cath, DCCV, TEE, etc)?    Press F2        :[id number]}    Medication Adjustments/Labs and Tests Ordered: Current medicines are reviewed at length with the patient today.  Concerns regarding medicines are outlined above.  No orders of the defined types were placed in this encounter.  No orders of the defined types were placed in this encounter.   There are no Patient Instructions on file for this visit.   Signed, Freada Bergeron, MD  10/19/2020 8:04 PM    [place]

## 2020-10-20 ENCOUNTER — Encounter: Payer: Self-pay | Admitting: Cardiology

## 2020-10-20 ENCOUNTER — Other Ambulatory Visit: Payer: Self-pay

## 2020-10-20 ENCOUNTER — Ambulatory Visit (INDEPENDENT_AMBULATORY_CARE_PROVIDER_SITE_OTHER): Payer: Medicare Other | Admitting: Cardiology

## 2020-10-20 VITALS — BP 140/78 | HR 61 | Ht 63.0 in | Wt 189.8 lb

## 2020-10-20 DIAGNOSIS — I5032 Chronic diastolic (congestive) heart failure: Secondary | ICD-10-CM | POA: Diagnosis not present

## 2020-10-20 DIAGNOSIS — I350 Nonrheumatic aortic (valve) stenosis: Secondary | ICD-10-CM | POA: Diagnosis not present

## 2020-10-20 DIAGNOSIS — I495 Sick sinus syndrome: Secondary | ICD-10-CM

## 2020-10-20 DIAGNOSIS — Z950 Presence of cardiac pacemaker: Secondary | ICD-10-CM

## 2020-10-20 DIAGNOSIS — I10 Essential (primary) hypertension: Secondary | ICD-10-CM | POA: Diagnosis not present

## 2020-10-20 DIAGNOSIS — I480 Paroxysmal atrial fibrillation: Secondary | ICD-10-CM

## 2020-10-20 DIAGNOSIS — D6869 Other thrombophilia: Secondary | ICD-10-CM | POA: Diagnosis not present

## 2020-10-20 MED ORDER — ELIQUIS 5 MG PO TABS: 5.0000 mg | ORAL_TABLET | Freq: Two times a day (BID) | ORAL | 1 refills | Status: DC
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
## 2020-10-20 NOTE — Patient Instructions (Signed)
Medication Instructions:   STOP TAKING PRADAXA NOW  START TAKING ELIQUIS 5 MG BY MOUTH TWICE DAILY  *If you need a refill on your cardiac medications before your next appointment, please call your pharmacy*   Follow-Up: At [facility], you and your health needs are our priority.  As part of our continuing mission to provide you with exceptional heart care, we have created designated Provider Care Teams.  These Care Teams include your primary Cardiologist (physician) and Advanced Practice Providers (APPs -  Physician Assistants and Nurse Practitioners) who all work together to provide you with the care you need, when you need it.  We recommend signing up for the patient portal called "MyChart".  Sign up information is provided on this After Visit Summary.  MyChart is used to connect with patients for Virtual Visits (Telemedicine).  Patients are able to view lab/test results, encounter notes, upcoming appointments, etc.  Non-urgent messages can be sent to your provider as well.   To learn more about what you can do with MyChart, go to [email or web address].    Your next appointment:   6-8  month(s)  The format for your next appointment:   In Person  Provider:   You will see one of the following Advanced Practice Providers on your designated Care Team:    Richardson Dopp, PA-C  Vin Bhagat, PA-C  Cecilie Kicks NP  DAYNA DUNN PA-C  JILL MCDANIEL NP  [facility] PA-C

## 2020-10-31 NOTE — Progress Notes (Signed)
Remote pacemaker transmission.   

## 2020-11-08 DIAGNOSIS — Z23 Encounter for immunization: Secondary | ICD-10-CM | POA: Diagnosis not present

## 2020-11-28 ENCOUNTER — Telehealth: Payer: Self-pay | Admitting: Cardiology

## 2020-11-28 MED ORDER — FLECAINIDE ACETATE 50 MG PO TABS: 50.0000 mg | ORAL_TABLET | Freq: Two times a day (BID) | ORAL | 1 refills | Status: DC

## 2020-11-28 MED ORDER — METOPROLOL SUCCINATE ER 25 MG PO TB24: 75.0000 mg | ORAL_TABLET | Freq: Every day | ORAL | 1 refills | Status: DC

## 2020-11-28 NOTE — Telephone Encounter (Signed)
Pt is calling to have a month supply of her flecainide and metoprolol sent to her local pharmacy [facility] [address], then after that, she will need further refills to go to her mail order [facility], but it takes around 10 days to get to her.  Pt states go ahead and send the refills to her local, and she will call our office back in 2 weeks, to have further refills sent to [facility], for this will cost her nothing, and insurance will approve to have these sent to mail order at that time.  Note placed to the pts local pharmacy [facility], to refill both meds for one month, and pt will call the office back in 2 weeks, to have them both sent for further refills to her mail order [place].  Pt was gracious for all the assistance provided.

## 2020-11-28 NOTE — Telephone Encounter (Signed)
*  STAT* If patient is at the pharmacy, call can be transferred to refill team.   1. Which medications need to be refilled? (please list name of each medication and dose if known) need a new prescription to her local pharmacy for her Metoprolol and Flecainide  2. Which pharmacy/location (including street and city if local pharmacy) is medication to be sent to? [facility] [address] [place]   3. Do they need a 30 day or 90 day supply? #30 for Metoprolol and # 20 for Flecainide   Pt says she would like to talk to the person that order the medicine please

## 2020-12-20 ENCOUNTER — Telehealth: Payer: Self-pay | Admitting: Cardiology

## 2020-12-20 MED ORDER — FLECAINIDE ACETATE 50 MG PO TABS: 50.0000 mg | ORAL_TABLET | Freq: Two times a day (BID) | ORAL | 1 refills | Status: DC

## 2020-12-20 MED ORDER — FUROSEMIDE 20 MG PO TABS: 20.0000 mg | ORAL_TABLET | Freq: Every day | ORAL | 1 refills | Status: DC

## 2020-12-20 MED ORDER — POTASSIUM CHLORIDE CRYS ER 10 MEQ PO TBCR: 10.0000 meq | EXTENDED_RELEASE_TABLET | Freq: Every day | ORAL | 1 refills | Status: DC

## 2020-12-20 MED ORDER — METOPROLOL SUCCINATE ER 25 MG PO TB24: 75.0000 mg | ORAL_TABLET | Freq: Every day | ORAL | 1 refills | Status: DC

## 2020-12-20 NOTE — Telephone Encounter (Signed)
*  STAT* If patient is at the pharmacy, call can be transferred to refill team.   1. Which medications need to be refilled? (please list name of each medication and dose if known)  flecainide (TAMBOCOR) 50 MG tablet   metoprolol succinate (TOPROL-XL) 25 MG 24 hr tablet   potassium chloride (KLOR-CON) 10 MEQ tablet  furosemide (LASIX) 20 MG tablet Preservision, but patient is not sure if it is prescribed by the office apixaban (ELIQUIS) 5 MG TABS tablet  2. Which pharmacy/location (including street and city if local pharmacy) is medication to be sent to?   [facility] - [place], [place] - [address]  3. Do they need a 30 day or 90 day supply? 90 day   Patient requests a call back from [facility] for her refills. She states she would like her specifically to call her. She states her prescriptions were sent to the wrong pharmacy last time and she had to pay for it.

## 2020-12-20 NOTE — Telephone Encounter (Signed)
Requested refills sent to the pts confirmed pharmacy of choice, for 90 day supply. Pt aware of this and was gracious for all the assistance provided.

## 2020-12-20 NOTE — Telephone Encounter (Signed)
Patient called back and wanted to know if she requested a refill on the Lasix and Potassium. I advised patient that she did request those medications and someone was working on the refills. She voiced understanding.

## 2021-01-05 ENCOUNTER — Other Ambulatory Visit: Payer: Self-pay

## 2021-01-05 ENCOUNTER — Emergency Department (HOSPITAL_BASED_OUTPATIENT_CLINIC_OR_DEPARTMENT_OTHER)
Admission: EM | Admit: 2021-01-05 | Discharge: 2021-01-05 | Disposition: A | Discharge status: Home / Self Care | Payer: Medicare Other | Attending: Emergency Medicine | Admitting: Emergency Medicine

## 2021-01-05 ENCOUNTER — Emergency Department (HOSPITAL_BASED_OUTPATIENT_CLINIC_OR_DEPARTMENT_OTHER): Payer: Medicare Other | Admitting: Radiology

## 2021-01-05 ENCOUNTER — Encounter (HOSPITAL_BASED_OUTPATIENT_CLINIC_OR_DEPARTMENT_OTHER): Payer: Self-pay | Admitting: Emergency Medicine

## 2021-01-05 DIAGNOSIS — Z79899 Other long term (current) drug therapy: Secondary | ICD-10-CM | POA: Insufficient documentation

## 2021-01-05 DIAGNOSIS — Z7901 Long term (current) use of anticoagulants: Secondary | ICD-10-CM | POA: Insufficient documentation

## 2021-01-05 DIAGNOSIS — Y92009 Unspecified place in unspecified non-institutional (private) residence as the place of occurrence of the external cause: Secondary | ICD-10-CM | POA: Diagnosis not present

## 2021-01-05 DIAGNOSIS — E039 Hypothyroidism, unspecified: Secondary | ICD-10-CM | POA: Diagnosis not present

## 2021-01-05 DIAGNOSIS — Z96651 Presence of right artificial knee joint: Secondary | ICD-10-CM | POA: Insufficient documentation

## 2021-01-05 DIAGNOSIS — I10 Essential (primary) hypertension: Secondary | ICD-10-CM | POA: Insufficient documentation

## 2021-01-05 DIAGNOSIS — I4891 Unspecified atrial fibrillation: Secondary | ICD-10-CM | POA: Insufficient documentation

## 2021-01-05 DIAGNOSIS — W182XXA Fall in (into) shower or empty bathtub, initial encounter: Secondary | ICD-10-CM | POA: Insufficient documentation

## 2021-01-05 DIAGNOSIS — Z87891 Personal history of nicotine dependence: Secondary | ICD-10-CM | POA: Diagnosis not present

## 2021-01-05 DIAGNOSIS — S2231XA Fracture of one rib, right side, initial encounter for closed fracture: Secondary | ICD-10-CM | POA: Insufficient documentation

## 2021-01-05 DIAGNOSIS — Z950 Presence of cardiac pacemaker: Secondary | ICD-10-CM | POA: Insufficient documentation

## 2021-01-05 DIAGNOSIS — S299XXA Unspecified injury of thorax, initial encounter: Secondary | ICD-10-CM | POA: Diagnosis present

## 2021-01-05 NOTE — ED Notes (Signed)
RT educated pt on proper use of IS. Pt able to demonstrate understanding and able to accomplish 2500 mL. Pt able to teach back and describe proper use and frequency to this RT.

## 2021-01-05 NOTE — ED Provider Notes (Signed)
[facility] EMERGENCY DEPT Provider Note   CSN: [id number] Arrival date & time: 01/05/21  1439     History Chief Complaint  Patient presents with   Fall   ribcage pain    [name] is a 87 y.o. female.  Patient with a fall in the tub at the friend's home on Sunday.  No loss of consciousness did not hit her head.  She kind of struck her right back area on chair.  Patient with some bruising to that area.  She has been rubbing the area with Voltaren and taking Tylenol.  No shortness of breath or fevers.  Denies any other injuries.  No hip pain no leg pain no upper extremity pain.  No abdominal pain.  No fevers no shortness of breath.  No anterior chest pain.      Past Medical History:  Diagnosis Date   Aortic stenosis 03/08/2016   Brady-tachy syndrome ([place])    a. Biotronik dual chamber PPM implanted 2009 b. gen change to STJ dual chamber PPM 2017   Ejection fraction    EF 70%, echo, October, 2009  //   EF 55-60%, septal dyssynergy consistent with a paced rhythm, mild to moderate mitral regurgitation, echo, November, 2015    GERD (gastroesophageal reflux disease) 04/28/2014   Episodes November, 2015 with fluid refluxing from her esophagus.    Hair loss    Patient questioned Coumadin, changed toPradaxa   Hypertension    Hypothyroidism    Lower extremity edema 01/22/2018   Mitral regurgitation 05/03/2014   Mild-to-moderate mitral regurgitation, echo, November, 2015    Osteoarthritis of left knee 10/25/2017   Paroxysmal atrial fibrillation (HCC)    Episodes rapid atrial fib noted by pacemaker interrogation, August, 2011, diltiazem added, patient improved. Patient continues on Rythmol  //   Changed to flecainide 2013  //   flecainide level checked in 2013, good level    Persistent atrial fibrillation [facility])     Patient Active Problem List   Diagnosis Date Noted   Secondary hypercoagulable state ([place]) 12/30/2019   Lower extremity edema 01/22/2018   History of total  knee replacement, right 10/25/2017   Osteoarthritis of left knee 10/25/2017   Persistent atrial fibrillation (HCC)    Hypertension    Hair loss    Aortic stenosis 03/08/2016   Chronic anticoagulation 12/17/2014   Mitral regurgitation 05/03/2014   Edema leg 04/28/2014   GERD (gastroesophageal reflux disease) 04/28/2014   Fatigue 12/26/2011   Ejection fraction    Normal nuclear stress test    Nausea    Brady-tachy syndrome (HCC)    Hypothyroidism    Paroxysmal atrial fibrillation [facility])    Drug therapy    Drug therapy    Pacemaker    Cough    ESSENTIAL HYPERTENSION, BENIGN 07/20/2010    Past Surgical History:  Procedure Laterality Date   ABDOMINAL HYSTERECTOMY     EP IMPLANTABLE DEVICE N/A 08/08/2015   Generator change with SJM Assurity DR PPM by Dr Rayann Heman   JOINT REPLACEMENT     PACEMAKER PLACEMENT  2009         OB History   No obstetric history on file.     Family History  Problem Relation Age of Onset   Heart disease Father    Coronary artery disease Other    Heart disease Son     Social History   Tobacco Use   Smoking status: Former    Types: Cigarettes    Quit date: 09/06/1990  Years since quitting: 30.3   Smokeless tobacco: Never  Vaping Use   Vaping Use: Never used  Substance Use Topics   Alcohol use: No   Drug use: No    Home Medications Prior to Admission medications   Medication Sig Start Date End Date Taking? Authorizing Provider  acetaminophen (TYLENOL) 500 MG tablet Take 500 mg by mouth daily.    [provider]  apixaban (ELIQUIS) 5 MG TABS tablet Take 1 tablet (5 mg total) by mouth 2 (two) times daily. 10/20/20   Freada Bergeron, MD  Apoaequorin (PREVAGEN PO) Take 1 capsule by mouth daily.    [provider]  Calcium Carbonate-Vit D-Min (CALCIUM 600+D PLUS MINERALS) 600-400 MG-UNIT TABS Take 1 tablet by mouth daily.    [provider]  cholecalciferol (VITAMIN D3) 25 MCG (1000 UNIT) tablet Take 1,000 Units  by mouth daily.    [provider]  diclofenac sodium (VOLTAREN) 1 % GEL Apply 1 application topically daily as needed (pain).    [provider]  flecainide (TAMBOCOR) 50 MG tablet Take 1 tablet (50 mg total) by mouth 2 (two) times daily. 12/20/20   Freada Bergeron, MD  fluticasone (FLONASE) 50 MCG/ACT nasal spray Place 2 sprays into both nostrils daily as needed for allergies or rhinitis.     [provider]  furosemide (LASIX) 20 MG tablet Take 1 tablet (20 mg total) by mouth daily. 12/20/20   Freada Bergeron, MD  levothyroxine (SYNTHROID) 112 MCG tablet Take 112 mcg by mouth daily before breakfast.    [provider]  levothyroxine (SYNTHROID) 125 MCG tablet Take 125 mcg by mouth every morning. 01/22/20   [provider]  Menthol (HONEY LEMON COUGH DROPS MT) Use as directed 1 lozenge in the mouth or throat every 2 (two) hours as needed (sore throat).    [provider]  metoprolol succinate (TOPROL-XL) 25 MG 24 hr tablet Take 3 tablets (75 mg total) by mouth daily. 12/20/20   Freada Bergeron, MD  Multiple Vitamins-Minerals (OCUVITE PO) Take 1 tablet by mouth daily.    [provider]  nitrofurantoin, macrocrystal-monohydrate, (MACROBID) 100 MG capsule Take 1 capsule (100 mg total) by mouth 2 (two) times daily. Take for 5 days only for UTI. 04/13/20   Dorothy Spark, MD  potassium chloride (KLOR-CON) 10 MEQ tablet Take 1 tablet (10 mEq total) by mouth daily. 12/20/20   Freada Bergeron, MD  vitamin B-12 (CYANOCOBALAMIN) 100 MCG tablet Take 100 mcg by mouth daily.    [provider]    Allergies    Morphine and related, Penicillins, Lasix [furosemide], Aspirin, Clindamycin/lincomycin, Crestor [rosuvastatin], Diltiazem, Erythromycin, Lisinopril, Metronidazole, and Penicillins cross reactors  Review of Systems   Review of Systems  Constitutional:  Negative for chills and fever.  HENT:  Negative for ear pain  and sore throat.   Eyes:  Negative for pain and visual disturbance.  Respiratory:  Negative for cough and shortness of breath.   Cardiovascular:  Negative for chest pain and palpitations.  Gastrointestinal:  Negative for abdominal pain and vomiting.  Genitourinary:  Negative for dysuria and hematuria.  Musculoskeletal:  Positive for back pain. Negative for arthralgias.  Skin:  Negative for color change and rash.  Neurological:  Negative for seizures and syncope.  Psychiatric/Behavioral:  Negative for confusion.   All other systems reviewed and are negative.  Physical Exam Updated Vital Signs BP (!) 192/67   Pulse 60   Temp 98.4 F (36.9  C)   Resp 18   SpO2 98%   Physical Exam Vitals and nursing note reviewed.  Constitutional:      General: She is not in acute distress.    Appearance: Normal appearance. She is well-developed.  HENT:     Head: Normocephalic and atraumatic.  Eyes:     Conjunctiva/sclera: Conjunctivae normal.     Pupils: Pupils are equal, round, and reactive to light.  Cardiovascular:     Rate and Rhythm: Normal rate and regular rhythm.     Heart sounds: No murmur heard. Pulmonary:     Effort: Pulmonary effort is normal. No respiratory distress.     Breath sounds: Normal breath sounds. No wheezing or rales.     Comments: Right posterior back with an area of some ecchymosis side yellow dark purple in color suggestive of an old bruise.  Tenderness to palpation there.  Overlying sort of the ninth rib area. Chest:     Chest wall: Tenderness present.  Abdominal:     Palpations: Abdomen is soft.     Tenderness: There is no abdominal tenderness.  Musculoskeletal:        General: No signs of injury. Normal range of motion.     Cervical back: Normal range of motion and neck supple. No rigidity.  Skin:    General: Skin is warm and dry.  Neurological:     General: No focal deficit present.     Mental Status: She is alert. Mental status is at baseline.    ED  Results / Procedures / Treatments   Labs (all labs ordered are listed, but only abnormal results are displayed) Labs Reviewed - No data to display  EKG None  Radiology DG Ribs Unilateral W/Chest Right  Result Date: 01/05/2021 CLINICAL DATA:  Fall, posterior rib pain EXAM: RIGHT RIBS AND CHEST - 3+ VIEW COMPARISON:  09/25/2020 FINDINGS: Left pacer remains in place, unchanged. Heart is normal size. Aortic atherosclerosis. Fracture seen in an anterior lower rib, likely the 9th or 10th anterior right rib. No visible effusion or pneumothorax crash that no effusion or pneumothorax. IMPRESSION: Anterior right lower rib fracture. No associated effusion or pneumothorax. Electronically Signed   By: Rolm Baptise M.D.   On: 01/05/2021 16:08    Procedures Procedures   Medications Ordered in ED Medications - No data to display  ED Course  I have reviewed the triage vital signs and the nursing notes.  Pertinent labs & imaging results that were available during my care of the patient were reviewed by me and considered in my medical decision making (see chart for details).    MDM Rules/Calculators/A&P                           X-Frett shows evidence of ninth or 10th rib fracture.  Underlying lung is normal.  This is the exact location where the patient has or bruising correlating to the fall on Sunday.  Will give patient incentive spirometer.  We will have her continue her Voltaren rub.  And also have her take Tylenol.  She will return for any new or worse symptoms or anything that seems to be development of pneumonia.   Final Clinical Impression(s) / ED Diagnoses Final diagnoses:  Closed fracture of one rib of right side, initial encounter    Rx / DC Orders ED Discharge Orders     None        Fredia Sorrow, MD 01/05/21 2134

## 2021-01-05 NOTE — Discharge Instructions (Addendum)
Use the incentive spirometer as directed.  Chest x-Broden shows a right ninth or 10th rib fracture.  No underlying lung abnormalities.  Continue to use your Voltaren rub.  And Tylenol as needed.  Return for any fever cough or any suggestion of developing pneumonia such as shortness of breath.  Follow-up with your doctor.

## 2021-01-05 NOTE — ED Triage Notes (Signed)
[facility] after a fall on Sunday in the bathtub. Pt states she missed the chair when she went to sit down and she fell, injuring her right ribcage. Pt states she has been using voltaren on it since then, but that the nurse there recommended she have an xray. Pt states it hurts in both front and back, as well as the side. Pt denies any loc or hitting her head. Pt alert & oriented, nad noted.

## 2021-01-13 ENCOUNTER — Ambulatory Visit (INDEPENDENT_AMBULATORY_CARE_PROVIDER_SITE_OTHER): Payer: Medicare Other

## 2021-01-13 DIAGNOSIS — I495 Sick sinus syndrome: Secondary | ICD-10-CM | POA: Diagnosis not present

## 2021-01-16 LAB — CUP PACEART REMOTE DEVICE CHECK
Battery Remaining Longevity: 65 mo
Battery Remaining Percentage: 53 %
Battery Voltage: 2.98 V
Brady Statistic AP VP Percent: 3.8 %
Brady Statistic AP VS Percent: 96 %
Brady Statistic AS VP Percent: 1 %
Brady Statistic AS VS Percent: 1 %
Brady Statistic RA Percent Paced: 99 %
Brady Statistic RV Percent Paced: 3.9 %
Date Time Interrogation Session: 20220805040020
Implantable Lead Implant Date: 20091109
Implantable Lead Implant Date: 20091109
Implantable Lead Location: 753859
Implantable Lead Location: 753860
Implantable Lead Model: 350
Implantable Lead Serial Number: 24891460
Implantable Lead Serial Number: 28411861
Implantable Pulse Generator Implant Date: 20170227
Lead Channel Impedance Value: 1375 Ohm
Lead Channel Impedance Value: 450 Ohm
Lead Channel Pacing Threshold Amplitude: 0.75 V
Lead Channel Pacing Threshold Amplitude: 1 V
Lead Channel Pacing Threshold Pulse Width: 0.4 ms
Lead Channel Pacing Threshold Pulse Width: 0.4 ms
Lead Channel Sensing Intrinsic Amplitude: 12 mV
Lead Channel Sensing Intrinsic Amplitude: 2 mV
Lead Channel Setting Pacing Amplitude: 2 V
Lead Channel Setting Pacing Amplitude: 2.5 V
Lead Channel Setting Pacing Pulse Width: 0.4 ms
Lead Channel Setting Sensing Sensitivity: 2 mV
Pulse Gen Model: 2240
Pulse Gen Serial Number: 7885781

## 2021-02-01 DIAGNOSIS — I480 Paroxysmal atrial fibrillation: Secondary | ICD-10-CM | POA: Diagnosis not present

## 2021-02-01 DIAGNOSIS — I495 Sick sinus syndrome: Secondary | ICD-10-CM | POA: Diagnosis not present

## 2021-02-01 DIAGNOSIS — R195 Other fecal abnormalities: Secondary | ICD-10-CM | POA: Diagnosis not present

## 2021-02-01 DIAGNOSIS — I509 Heart failure, unspecified: Secondary | ICD-10-CM | POA: Diagnosis not present

## 2021-02-01 DIAGNOSIS — I350 Nonrheumatic aortic (valve) stenosis: Secondary | ICD-10-CM | POA: Diagnosis not present

## 2021-02-01 DIAGNOSIS — I10 Essential (primary) hypertension: Secondary | ICD-10-CM | POA: Diagnosis not present

## 2021-02-01 DIAGNOSIS — R5381 Other malaise: Secondary | ICD-10-CM | POA: Diagnosis not present

## 2021-02-01 DIAGNOSIS — Z950 Presence of cardiac pacemaker: Secondary | ICD-10-CM | POA: Diagnosis not present

## 2021-02-01 DIAGNOSIS — E039 Hypothyroidism, unspecified: Secondary | ICD-10-CM | POA: Diagnosis not present

## 2021-02-02 NOTE — Progress Notes (Signed)
Remote pacemaker transmission.   

## 2021-02-10 DIAGNOSIS — H02421 Myogenic ptosis of right eyelid: Secondary | ICD-10-CM | POA: Diagnosis not present

## 2021-02-10 DIAGNOSIS — H52223 Regular astigmatism, bilateral: Secondary | ICD-10-CM | POA: Diagnosis not present

## 2021-02-10 DIAGNOSIS — Z961 Presence of intraocular lens: Secondary | ICD-10-CM | POA: Diagnosis not present

## 2021-02-28 DIAGNOSIS — Z23 Encounter for immunization: Secondary | ICD-10-CM | POA: Diagnosis not present

## 2021-03-06 DIAGNOSIS — D485 Neoplasm of uncertain behavior of skin: Secondary | ICD-10-CM | POA: Diagnosis not present

## 2021-03-06 DIAGNOSIS — L820 Inflamed seborrheic keratosis: Secondary | ICD-10-CM | POA: Diagnosis not present

## 2021-03-06 DIAGNOSIS — D492 Neoplasm of unspecified behavior of bone, soft tissue, and skin: Secondary | ICD-10-CM | POA: Diagnosis not present

## 2021-03-06 DIAGNOSIS — L218 Other seborrheic dermatitis: Secondary | ICD-10-CM | POA: Diagnosis not present

## 2021-03-06 DIAGNOSIS — R208 Other disturbances of skin sensation: Secondary | ICD-10-CM | POA: Diagnosis not present

## 2021-03-07 ENCOUNTER — Telehealth: Payer: Self-pay | Admitting: Internal Medicine

## 2021-03-07 MED ORDER — APIXABAN 5 MG PO TABS: 5.0000 mg | ORAL_TABLET | Freq: Two times a day (BID) | ORAL | 5 refills | Status: DC

## 2021-03-07 NOTE — Telephone Encounter (Signed)
*  STAT* If patient is at the pharmacy, call can be transferred to refill team.   1. Which medications need to be refilled? (please list name of each medication and dose if known) Eliquis   2. Which pharmacy/location (including street and city if local pharmacy) is medication to be sent to? [facility]   3. Do they need a 30 day or 90 day supply? 30 days     Patient is out

## 2021-03-07 NOTE — Telephone Encounter (Signed)
Pt last saw Dr Johney Frame 10/20/20, last labs 09/25/20 Creat 1.10, age 87, weight 86.1kg, based on specified criteria pt is on appropriate dosage of Eliquis 5mg  BID for afib.  Will refill rx.

## 2021-03-21 ENCOUNTER — Telehealth: Payer: Self-pay | Admitting: Cardiology

## 2021-03-21 MED ORDER — FLECAINIDE ACETATE 50 MG PO TABS: 50.0000 mg | ORAL_TABLET | Freq: Two times a day (BID) | ORAL | 2 refills | Status: DC

## 2021-03-21 MED ORDER — METOPROLOL SUCCINATE ER 25 MG PO TB24: 75.0000 mg | ORAL_TABLET | Freq: Every day | ORAL | 2 refills | Status: DC

## 2021-03-21 MED ORDER — POTASSIUM CHLORIDE CRYS ER 10 MEQ PO TBCR: 10.0000 meq | EXTENDED_RELEASE_TABLET | Freq: Every day | ORAL | 2 refills | Status: DC

## 2021-03-21 NOTE — Telephone Encounter (Signed)
*  STAT* If patient is at the pharmacy, call can be transferred to refill team.   1. Which medications need to be refilled? (please list name of each medication and dose if known)  flecainide (TAMBOCOR) 50 MG tablet metoprolol succinate (TOPROL-XL) 25 MG 24 hr tablet Apoaequorin (PREVAGEN PO) potassium chloride (KLOR-CON) 10 MEQ tablet  2. Which pharmacy/location (including street and city if local pharmacy) is medication to be sent to? [facility] - [place], [address]  3. Do they need a 30 day or 90 day supply? 90 day

## 2021-03-21 NOTE — Telephone Encounter (Signed)
Pt's medications were sent to pt's pharmacy as requested. Confirmation received.  

## 2021-04-05 ENCOUNTER — Encounter: Payer: Medicare Other | Admitting: Internal Medicine

## 2021-04-05 DIAGNOSIS — I872 Venous insufficiency (chronic) (peripheral): Secondary | ICD-10-CM

## 2021-04-05 DIAGNOSIS — R001 Bradycardia, unspecified: Secondary | ICD-10-CM

## 2021-04-05 DIAGNOSIS — I10 Essential (primary) hypertension: Secondary | ICD-10-CM

## 2021-04-05 DIAGNOSIS — I480 Paroxysmal atrial fibrillation: Secondary | ICD-10-CM

## 2021-04-14 ENCOUNTER — Ambulatory Visit (INDEPENDENT_AMBULATORY_CARE_PROVIDER_SITE_OTHER): Payer: Medicare Other

## 2021-04-14 DIAGNOSIS — I495 Sick sinus syndrome: Secondary | ICD-10-CM | POA: Diagnosis not present

## 2021-04-14 LAB — CUP PACEART REMOTE DEVICE CHECK
Battery Remaining Longevity: 62 mo
Battery Remaining Percentage: 51 %
Battery Voltage: 2.98 V
Brady Statistic AP VP Percent: 3.9 %
Brady Statistic AP VS Percent: 96 %
Brady Statistic AS VP Percent: 1 %
Brady Statistic AS VS Percent: 1 %
Brady Statistic RA Percent Paced: 99 %
Brady Statistic RV Percent Paced: 3.9 %
Date Time Interrogation Session: 20221104040013
Implantable Lead Implant Date: 20091109
Implantable Lead Implant Date: 20091109
Implantable Lead Location: 753859
Implantable Lead Location: 753860
Implantable Lead Model: 350
Implantable Lead Serial Number: 24891460
Implantable Lead Serial Number: 28411861
Implantable Pulse Generator Implant Date: 20170227
Lead Channel Impedance Value: 1300 Ohm
Lead Channel Impedance Value: 460 Ohm
Lead Channel Pacing Threshold Amplitude: 0.75 V
Lead Channel Pacing Threshold Amplitude: 1 V
Lead Channel Pacing Threshold Pulse Width: 0.4 ms
Lead Channel Pacing Threshold Pulse Width: 0.4 ms
Lead Channel Sensing Intrinsic Amplitude: 12 mV
Lead Channel Sensing Intrinsic Amplitude: 2.1 mV
Lead Channel Setting Pacing Amplitude: 2 V
Lead Channel Setting Pacing Amplitude: 2.5 V
Lead Channel Setting Pacing Pulse Width: 0.4 ms
Lead Channel Setting Sensing Sensitivity: 2 mV
Pulse Gen Model: 2240
Pulse Gen Serial Number: 7885781

## 2021-04-19 NOTE — Progress Notes (Signed)
Remote pacemaker transmission.   

## 2021-05-05 ENCOUNTER — Encounter (HOSPITAL_COMMUNITY): Payer: Self-pay

## 2021-05-05 ENCOUNTER — Other Ambulatory Visit: Payer: Self-pay

## 2021-05-05 ENCOUNTER — Ambulatory Visit (HOSPITAL_COMMUNITY)
Admission: EM | Admit: 2021-05-05 | Discharge: 2021-05-05 | Disposition: A | Discharge status: Home / Self Care | Payer: Medicare Other | Attending: Internal Medicine | Admitting: Internal Medicine

## 2021-05-05 DIAGNOSIS — H1032 Unspecified acute conjunctivitis, left eye: Secondary | ICD-10-CM | POA: Diagnosis not present

## 2021-05-05 MED ORDER — CIPROFLOXACIN HCL 0.3 % OP OINT: TOPICAL_OINTMENT | Freq: Two times a day (BID) | OPHTHALMIC | 0 refills | Status: DC

## 2021-05-05 MED ORDER — OLOPATADINE HCL 0.1 % OP SOLN: 1.0000 [drp] | Freq: Two times a day (BID) | OPHTHALMIC | 12 refills | Status: AC

## 2021-05-05 NOTE — ED Triage Notes (Signed)
Pt presents with left eye redness, watering, itching, and irritation X 1 week.

## 2021-05-05 NOTE — Discharge Instructions (Addendum)
Please use medications as prescribed If you have blurry vision, double vision or worsening eye pain please return to urgent care to be reevaluated.

## 2021-05-05 NOTE — ED Provider Notes (Signed)
[place]    CSN: [id number] Arrival date & time: 05/05/21  1029      History   Chief Complaint Chief Complaint  Patient presents with   Conjunctivitis    HPI [name] is a 87 y.o. female comes to urgent care with 1 week history of left eye redness, watering, itching and irritation.  Patient says the symptoms started fairly abruptly and is been persistent.  She denies any contact lens use.  No trauma to the eye.  No fever or chills.  No purulent discharge.  Patient denied any blurry vision or double vision.  She endorses itching of the left eye and she is being rubbing her eye as a result of that.  No overt respiratory infection symptoms.  No sick contacts.  Patient lives in assisted living facility.  No history of seasonal allergies.  HPI  Past Medical History:  Diagnosis Date   Aortic stenosis 03/08/2016   Brady-tachy syndrome ([place])    a. Biotronik dual chamber PPM implanted 2009 b. gen change to STJ dual chamber PPM 2017   Ejection fraction    EF 70%, echo, October, 2009  //   EF 55-60%, septal dyssynergy consistent with a paced rhythm, mild to moderate mitral regurgitation, echo, November, 2015    GERD (gastroesophageal reflux disease) 04/28/2014   Episodes November, 2015 with fluid refluxing from her esophagus.    Hair loss    Patient questioned Coumadin, changed toPradaxa   Hypertension    Hypothyroidism    Lower extremity edema 01/22/2018   Mitral regurgitation 05/03/2014   Mild-to-moderate mitral regurgitation, echo, November, 2015    Osteoarthritis of left knee 10/25/2017   Paroxysmal atrial fibrillation (HCC)    Episodes rapid atrial fib noted by pacemaker interrogation, August, 2011, diltiazem added, patient improved. Patient continues on Rythmol  //   Changed to flecainide 2013  //   flecainide level checked in 2013, good level    Persistent atrial fibrillation [facility])     Patient Active Problem List   Diagnosis Date Noted   Secondary  hypercoagulable state ([place]) 12/30/2019   Lower extremity edema 01/22/2018   History of total knee replacement, right 10/25/2017   Osteoarthritis of left knee 10/25/2017   Persistent atrial fibrillation (HCC)    Hypertension    Hair loss    Aortic stenosis 03/08/2016   Chronic anticoagulation 12/17/2014   Mitral regurgitation 05/03/2014   Edema leg 04/28/2014   GERD (gastroesophageal reflux disease) 04/28/2014   Fatigue 12/26/2011   Ejection fraction    Normal nuclear stress test    Nausea    Brady-tachy syndrome (HCC)    Hypothyroidism    Paroxysmal atrial fibrillation [facility])    Drug therapy    Drug therapy    Pacemaker    Cough    ESSENTIAL HYPERTENSION, BENIGN 07/20/2010    Past Surgical History:  Procedure Laterality Date   ABDOMINAL HYSTERECTOMY     EP IMPLANTABLE DEVICE N/A 08/08/2015   Generator change with SJM Assurity DR PPM by Dr Rayann Heman   JOINT REPLACEMENT     PACEMAKER PLACEMENT  2009        OB History   No obstetric history on file.      Home Medications    Prior to Admission medications   Medication Sig Start Date End Date Taking? Authorizing Provider  ciprofloxacin (CILOXAN) 0.3 % ophthalmic ointment Place into the left eye 2 (two) times daily. 05/05/21  Yes Asuka Dusseau, Myrene Galas, MD  olopatadine (PATANOL)  0.1 % ophthalmic solution Place 1 drop into the left eye 2 (two) times daily for 7 days. 05/05/21 05/12/21 Yes Darnetta Kesselman, Myrene Galas, MD  acetaminophen (TYLENOL) 500 MG tablet Take 500 mg by mouth daily.    [provider]  apixaban (ELIQUIS) 5 MG TABS tablet Take 1 tablet (5 mg total) by mouth 2 (two) times daily. 03/07/21   Freada Bergeron, MD  Apoaequorin (PREVAGEN PO) Take 1 capsule by mouth daily.    [provider]  Calcium Carbonate-Vit D-Min (CALCIUM 600+D PLUS MINERALS) 600-400 MG-UNIT TABS Take 1 tablet by mouth daily.    [provider]  cholecalciferol (VITAMIN D3) 25 MCG (1000 UNIT) tablet Take 1,000 Units by mouth  daily.    [provider]  diclofenac sodium (VOLTAREN) 1 % GEL Apply 1 application topically daily as needed (pain).    [provider]  flecainide (TAMBOCOR) 50 MG tablet Take 1 tablet (50 mg total) by mouth 2 (two) times daily. 03/21/21   Freada Bergeron, MD  fluticasone (FLONASE) 50 MCG/ACT nasal spray Place 2 sprays into both nostrils daily as needed for allergies or rhinitis.     [provider]  furosemide (LASIX) 20 MG tablet Take 1 tablet (20 mg total) by mouth daily. 12/20/20   Freada Bergeron, MD  levothyroxine (SYNTHROID) 112 MCG tablet Take 112 mcg by mouth daily before breakfast.    [provider]  levothyroxine (SYNTHROID) 125 MCG tablet Take 125 mcg by mouth every morning. 01/22/20   [provider]  Menthol (HONEY LEMON COUGH DROPS MT) Use as directed 1 lozenge in the mouth or throat every 2 (two) hours as needed (sore throat).    [provider]  metoprolol succinate (TOPROL-XL) 25 MG 24 hr tablet Take 3 tablets (75 mg total) by mouth daily. 03/21/21   Freada Bergeron, MD  Multiple Vitamins-Minerals (OCUVITE PO) Take 1 tablet by mouth daily.    [provider]  nitrofurantoin, macrocrystal-monohydrate, (MACROBID) 100 MG capsule Take 1 capsule (100 mg total) by mouth 2 (two) times daily. Take for 5 days only for UTI. 04/13/20   Dorothy Spark, MD  potassium chloride (KLOR-CON) 10 MEQ tablet Take 1 tablet (10 mEq total) by mouth daily. 03/21/21   Freada Bergeron, MD  vitamin B-12 (CYANOCOBALAMIN) 100 MCG tablet Take 100 mcg by mouth daily.    [provider]    Family History Family History  Problem Relation Age of Onset   Heart disease Father    Coronary artery disease Other    Heart disease Son     Social History Social History   Tobacco Use   Smoking status: Former    Types: Cigarettes    Quit date: 09/06/1990    Years since quitting: 30.6   Smokeless tobacco: Never  Vaping  Use   Vaping Use: Never used  Substance Use Topics   Alcohol use: No   Drug use: No     Allergies   Morphine and related, Penicillins, Lasix [furosemide], Aspirin, Clindamycin/lincomycin, Crestor [rosuvastatin], Diltiazem, Erythromycin, Lisinopril, Metronidazole, and Penicillins cross reactors   Review of Systems Review of Systems  HENT:  Negative for ear discharge, ear pain and facial swelling.   Eyes:  Positive for pain, discharge, redness and itching. Negative for photophobia and visual disturbance.  Respiratory: Negative.      Physical Exam Triage Vital Signs ED Triage Vitals [05/05/21 1244]  Enc Vitals Group     BP (!) 148/64  Pulse Rate 67     Resp 17     Temp 98.3 F (36.8 C)     Temp Source Oral     SpO2 93 %     Weight      Height      Head Circumference      Peak Flow      Pain Score 4     Pain Loc      Pain Edu?      Excl. in [place]?    No data found.  Updated Vital Signs BP (!) 148/64 (BP Location: Right Arm)   Pulse 67   Temp 98.3 F (36.8 C) (Oral)   Resp 17   SpO2 93%   Visual Acuity Right Eye Distance:   Left Eye Distance:   Bilateral Distance:    Right Eye Near:   Left Eye Near:    Bilateral Near:     Physical Exam HENT:     Right Ear: Tympanic membrane normal.     Left Ear: Tympanic membrane normal.     Mouth/Throat:     Pharynx: No posterior oropharyngeal erythema.  Eyes:     General:        Right eye: No discharge.        Left eye: No discharge.     Extraocular Movements: Extraocular movements intact.     Pupils: Pupils are equal, round, and reactive to light.     Comments: Conjunctival erythema involving the left eye.  Patient has mild ectropion of the left lower eyelid  Cardiovascular:     Rate and Rhythm: Normal rate and regular rhythm.     UC Treatments / Results  Labs (all labs ordered are listed, but only abnormal results are displayed) Labs Reviewed - No data to display  EKG   Radiology No results  found.  Procedures Procedures (including critical care time)  Medications Ordered in UC Medications - No data to display  Initial Impression / Assessment and Plan / UC Course  I have reviewed the triage vital signs and the nursing notes.  Pertinent labs & imaging results that were available during my care of the patient were reviewed by me and considered in my medical decision making (see chart for details).     1.  Acute conjunctivitis involving the left eye: This is likely irritant Patanol eyedrops Ciprofloxacin eyedrops Return to urgent care if symptoms worsen. Final Clinical Impressions(s) / UC Diagnoses   Final diagnoses:  Acute conjunctivitis of left eye, unspecified acute conjunctivitis type     Discharge Instructions      Please use medications as prescribed If you have blurry vision, double vision or worsening eye pain please return to urgent care to be reevaluated.   ED Prescriptions     Medication Sig Dispense Auth. Provider   olopatadine (PATANOL) 0.1 % ophthalmic solution Place 1 drop into the left eye 2 (two) times daily for 7 days. 5 mL Becka Lagasse, Myrene Galas, MD   ciprofloxacin (CILOXAN) 0.3 % ophthalmic ointment Place into the left eye 2 (two) times daily. 3.5 g Andrw Mcguirt, Myrene Galas, MD      PDMP not reviewed this encounter.   Chase Picket, MD 05/05/21 1351

## 2021-05-08 DIAGNOSIS — H02421 Myogenic ptosis of right eyelid: Secondary | ICD-10-CM | POA: Diagnosis not present

## 2021-05-08 DIAGNOSIS — T495X5A Adverse effect of ophthalmological drugs and preparations, initial encounter: Secondary | ICD-10-CM | POA: Diagnosis not present

## 2021-05-08 DIAGNOSIS — B300 Keratoconjunctivitis due to adenovirus: Secondary | ICD-10-CM | POA: Diagnosis not present

## 2021-05-19 DIAGNOSIS — E039 Hypothyroidism, unspecified: Secondary | ICD-10-CM | POA: Diagnosis not present

## 2021-05-25 DIAGNOSIS — Z23 Encounter for immunization: Secondary | ICD-10-CM | POA: Diagnosis not present

## 2021-06-06 ENCOUNTER — Telehealth: Payer: Self-pay | Admitting: Cardiology

## 2021-06-06 NOTE — Telephone Encounter (Signed)
Pt already has refills at [facility], [name]

## 2021-06-06 NOTE — Telephone Encounter (Signed)
°*  STAT* If patient is at the pharmacy, call can be transferred to refill team.   1. Which medications need to be refilled? (please list name of each medication and dose if known) Flecainide, Metoprolol, Eliquis,  Furosemide, and [place]- Con  2. Which pharmacy/location (including street and city if local pharmacy) is medication to be sent to?[facility]  3. Do they need a 30 day or 90 day supply? 90 days and refills

## 2021-06-28 ENCOUNTER — Other Ambulatory Visit: Payer: Self-pay | Admitting: Cardiology

## 2021-06-28 MED ORDER — APIXABAN 5 MG PO TABS: 5.0000 mg | ORAL_TABLET | Freq: Two times a day (BID) | ORAL | 0 refills | Status: DC

## 2021-06-28 NOTE — Telephone Encounter (Signed)
Prescription refill request for Eliquis received. Indication: Afib  Last office visit:10/20/20 ([facility])  Scr: 1.09 (09/25/20)  Age: 88 Weight: 86.1kg  Appropriate dose and refill sent to requested pharmacy.

## 2021-06-28 NOTE — Telephone Encounter (Signed)
°*  STAT* If patient is at the pharmacy, call can be transferred to refill team.   1. Which medications need to be refilled? (please list name of each medication and dose if known) apixaban (ELIQUIS) 5 MG TABS tablet  2. Which pharmacy/location (including street and city if local pharmacy) is medication to be sent to? [facility] - [place], [address]  3. Do they need a 30 day or 90 day supply? 90 day

## 2021-07-07 DIAGNOSIS — H10533 Contact blepharoconjunctivitis, bilateral: Secondary | ICD-10-CM | POA: Diagnosis not present

## 2021-07-14 ENCOUNTER — Ambulatory Visit (INDEPENDENT_AMBULATORY_CARE_PROVIDER_SITE_OTHER): Payer: Medicare Other

## 2021-07-14 DIAGNOSIS — I495 Sick sinus syndrome: Secondary | ICD-10-CM | POA: Diagnosis not present

## 2021-07-14 DIAGNOSIS — R059 Cough, unspecified: Secondary | ICD-10-CM | POA: Diagnosis not present

## 2021-07-14 DIAGNOSIS — J398 Other specified diseases of upper respiratory tract: Secondary | ICD-10-CM | POA: Diagnosis not present

## 2021-07-14 LAB — CUP PACEART REMOTE DEVICE CHECK
Battery Remaining Longevity: 59 mo
Battery Remaining Percentage: 48 %
Battery Voltage: 2.98 V
Brady Statistic AP VP Percent: 3.6 %
Brady Statistic AP VS Percent: 96 %
Brady Statistic AS VP Percent: 1 %
Brady Statistic AS VS Percent: 1 %
Brady Statistic RA Percent Paced: 99 %
Brady Statistic RV Percent Paced: 3.6 %
Date Time Interrogation Session: 20230203040019
Implantable Lead Implant Date: 20091109
Implantable Lead Implant Date: 20091109
Implantable Lead Location: 753859
Implantable Lead Location: 753860
Implantable Lead Model: 350
Implantable Lead Serial Number: 24891460
Implantable Lead Serial Number: 28411861
Implantable Pulse Generator Implant Date: 20170227
Lead Channel Impedance Value: 1300 Ohm
Lead Channel Impedance Value: 450 Ohm
Lead Channel Pacing Threshold Amplitude: 0.75 V
Lead Channel Pacing Threshold Amplitude: 1 V
Lead Channel Pacing Threshold Pulse Width: 0.4 ms
Lead Channel Pacing Threshold Pulse Width: 0.4 ms
Lead Channel Sensing Intrinsic Amplitude: 12 mV
Lead Channel Sensing Intrinsic Amplitude: 2 mV
Lead Channel Setting Pacing Amplitude: 2 V
Lead Channel Setting Pacing Amplitude: 2.5 V
Lead Channel Setting Pacing Pulse Width: 0.4 ms
Lead Channel Setting Sensing Sensitivity: 2 mV
Pulse Gen Model: 2240
Pulse Gen Serial Number: 7885781

## 2021-07-19 NOTE — Progress Notes (Signed)
Remote pacemaker transmission.   

## 2021-09-06 DIAGNOSIS — L239 Allergic contact dermatitis, unspecified cause: Secondary | ICD-10-CM | POA: Diagnosis not present

## 2021-09-06 DIAGNOSIS — L0101 Non-bullous impetigo: Secondary | ICD-10-CM | POA: Diagnosis not present

## 2021-09-19 ENCOUNTER — Telehealth: Payer: Self-pay | Admitting: Cardiology

## 2021-09-19 ENCOUNTER — Ambulatory Visit (HOSPITAL_COMMUNITY): Payer: Medicare Other | Attending: Cardiology

## 2021-09-19 DIAGNOSIS — I350 Nonrheumatic aortic (valve) stenosis: Secondary | ICD-10-CM | POA: Insufficient documentation

## 2021-09-19 LAB — ECHOCARDIOGRAM COMPLETE
AR max vel: 1.5 cm2
AV Area VTI: 1.65 cm2
AV Area mean vel: 1.53 cm2
AV Mean grad: 25.5 mmHg
AV Peak grad: 42.6 mmHg
Ao pk vel: 3.27 m/s
Area-P 1/2: 2.24 cm2
S' Lateral: 2.5 cm

## 2021-09-19 MED ORDER — PERFLUTREN LIPID MICROSPHERE
1.0000 mL | INTRAVENOUS | Status: AC | PRN
  Administered 2021-09-19: 1 mL via INTRAVENOUS

## 2021-09-19 NOTE — Telephone Encounter (Signed)
Patient c/o Palpitations:  High priority if patient c/o lightheadedness, shortness of breath, or chest pain ? ?How long have you had palpitations/irregular HR/ Afib? Are you having the symptoms now? Racing heart rate that started today. She had an echo today so daughter is not sure if it is a result of that.  ? ?Are you currently experiencing lightheadedness, SOB or CP? headache ? ?Do you have a history of afib (atrial fibrillation) or irregular heart rhythm? Yes afib ? ?Have you checked your BP or HR? (document readings if available): no - does not have anything to use to check these ? ?Are you experiencing any other symptoms? Headache on and off, rushing to the bathroom, vein in left temple was bulging.  ?

## 2021-09-19 NOTE — Telephone Encounter (Signed)
Thank you so much for letting me know. Agree with your plan! ?

## 2021-09-19 NOTE — Telephone Encounter (Signed)
Headache on top of her head and a vein was hard and raised on side of her face.  These started at 2:30 pm.  She took tylenol and symptoms improved.    She had definity w her echo today.  Discussed could be side effect of definiity.  Adv pt could take further tylenol later before bed if needed.  Informed if further questions they can call back and an on call provider can assist but that if headache returns and is worse, or if there are any other neurological changes like changes in vision or speech, weakness or lightheadedness, that she should call EMS to be evaluation/taken to hospital. ? ?She could feel her heart beating irregularly for a short while, felt like she was in afib.  Heart beat has gone back to normal.   No vital signs available. ?

## 2021-09-20 ENCOUNTER — Telehealth: Payer: Self-pay | Admitting: Cardiology

## 2021-09-20 NOTE — Telephone Encounter (Signed)
Patient was returning call 

## 2021-09-20 NOTE — Telephone Encounter (Signed)
Returned call to Pt. ? ?Advised of echo results. ? ?Freada Bergeron, MD sent to [facility] T[facility] ?Her echo shows normal pumping function. She has mild leakiness of her mitral valve and moderate narrowing of her aortic valve. This is stable from her prior echo. We will continue to monitor with yearly echoes.  ?

## 2021-09-24 ENCOUNTER — Other Ambulatory Visit: Payer: Self-pay

## 2021-09-24 ENCOUNTER — Encounter (HOSPITAL_COMMUNITY): Payer: Self-pay

## 2021-09-24 ENCOUNTER — Emergency Department (HOSPITAL_COMMUNITY)
Admission: EM | Admit: 2021-09-24 | Discharge: 2021-09-24 | Disposition: A | Discharge status: Home / Self Care | Payer: Medicare Other | Attending: Emergency Medicine | Admitting: Emergency Medicine

## 2021-09-24 DIAGNOSIS — Z950 Presence of cardiac pacemaker: Secondary | ICD-10-CM | POA: Insufficient documentation

## 2021-09-24 DIAGNOSIS — R002 Palpitations: Secondary | ICD-10-CM | POA: Diagnosis not present

## 2021-09-24 DIAGNOSIS — I4892 Unspecified atrial flutter: Secondary | ICD-10-CM | POA: Diagnosis not present

## 2021-09-24 DIAGNOSIS — R000 Tachycardia, unspecified: Secondary | ICD-10-CM | POA: Diagnosis not present

## 2021-09-24 DIAGNOSIS — R0602 Shortness of breath: Secondary | ICD-10-CM | POA: Insufficient documentation

## 2021-09-24 DIAGNOSIS — I484 Atypical atrial flutter: Secondary | ICD-10-CM

## 2021-09-24 DIAGNOSIS — I480 Paroxysmal atrial fibrillation: Secondary | ICD-10-CM

## 2021-09-24 DIAGNOSIS — Z7901 Long term (current) use of anticoagulants: Secondary | ICD-10-CM | POA: Diagnosis not present

## 2021-09-24 LAB — BASIC METABOLIC PANEL
Anion gap: 8 (ref 5–15)
BUN: 15 mg/dL (ref 8–23)
CO2: 27 mmol/L (ref 22–32)
Calcium: 9.7 mg/dL (ref 8.9–10.3)
Chloride: 107 mmol/L (ref 98–111)
Creatinine, Ser: 0.93 mg/dL (ref 0.44–1.00)
GFR, Estimated: 58 mL/min — ABNORMAL LOW (ref >60)
Glucose, Bld: 110 mg/dL — ABNORMAL HIGH (ref 70–99)
Potassium: 4.1 mmol/L (ref 3.5–5.1)
Sodium: 142 mmol/L (ref 135–145)

## 2021-09-24 LAB — CBC
HCT: 47.4 % — ABNORMAL HIGH (ref 36.0–46.0)
Hemoglobin: 15.8 g/dL — ABNORMAL HIGH (ref 12.0–15.0)
MCH: 32.5 pg (ref 26.0–34.0)
MCHC: 33.3 g/dL (ref 30.0–36.0)
MCV: 97.5 fL (ref 80.0–100.0)
Platelets: 124 10*3/uL — ABNORMAL LOW (ref 150–400)
RBC: 4.86 MIL/uL (ref 3.87–5.11)
RDW: 13 % (ref 11.5–15.5)
WBC: 5.1 10*3/uL (ref 4.0–10.5)
nRBC: 0 % (ref 0.0–0.2)

## 2021-09-24 LAB — MAGNESIUM: Magnesium: 2 mg/dL (ref 1.7–2.4)

## 2021-09-24 LAB — T4, FREE: Free T4: 0.97 ng/dL (ref 0.61–1.12)

## 2021-09-24 LAB — TSH: TSH: 2.159 u[IU]/mL (ref 0.350–4.500)

## 2021-09-24 LAB — BRAIN NATRIURETIC PEPTIDE: B Natriuretic Peptide: 237.1 pg/mL — ABNORMAL HIGH (ref 0.0–100.0)

## 2021-09-24 MED ORDER — PROPOFOL 10 MG/ML IV BOLUS
60.0000 mg | Freq: Once | INTRAVENOUS | Status: DC
  Filled 2021-09-24: qty 20

## 2021-09-24 MED ORDER — PROPOFOL 10 MG/ML IV BOLUS
INTRAVENOUS | Status: AC | PRN
  Administered 2021-09-24: 60 mg via INTRAVENOUS

## 2021-09-24 MED ORDER — ACETAMINOPHEN 500 MG PO TABS
1000.0000 mg | ORAL_TABLET | Freq: Once | ORAL | Status: AC
Start: 2021-09-24 — End: 2021-09-24
  Administered 2021-09-24: 1000 mg via ORAL
  Filled 2021-09-24: qty 2

## 2021-09-24 NOTE — ED Notes (Signed)
Reviewed discharge instructions with patient and daughter. Follow-up care reviewed. Patient and daughter verbalized understanding. Patient A&Ox4, VSS, and ambulatory with steady gait upon discharge.  

## 2021-09-24 NOTE — Sedation Documentation (Signed)
Consent obtained at this time ?

## 2021-09-24 NOTE — ED Provider Notes (Signed)
?[facility] ?Provider Note ? ? ?CSN: [id number] ?Arrival date & time: 09/24/21  1112 ? ?  ? ?History ? ?Chief Complaint  ?Patient presents with  ? Abnormal Heart Rhythm  ?  V-tach  ? ? ?[name] is a 88 y.o. female. ? ?HPI ?Patient presents via EMS due to concern for tachycardia.  Patient has no pain, no dyspnea.  She has a history of A-fib, has a pacemaker in place.  She notes that she awoke, felt her heart rate to be rapid, called EMS.  She has not tried exertion, or substantial activity since that time.  EMS reports the patient had heart rate in the 120s in route, did not receive medication.  Blood pressure normal, reportedly. ?Patient is on blood thinning medication. ?  ? ?Home Medications ?Prior to Admission medications   ?Medication Sig Start Date End Date Taking? Authorizing Provider  ?acetaminophen (TYLENOL) 500 MG tablet Take 500 mg by mouth daily.    [provider]  ?apixaban (ELIQUIS) 5 MG TABS tablet Take 1 tablet (5 mg total) by mouth 2 (two) times daily. 06/28/21   Freada Bergeron, MD  ?Apoaequorin (PREVAGEN PO) Take 1 capsule by mouth daily.    [provider]  ?Calcium Carbonate-Vit D-Min (CALCIUM 600+D PLUS MINERALS) 600-400 MG-UNIT TABS Take 1 tablet by mouth daily.    [provider]  ?cholecalciferol (VITAMIN D3) 25 MCG (1000 UNIT) tablet Take 1,000 Units by mouth daily.    [provider]  ?ciprofloxacin (CILOXAN) 0.3 % ophthalmic ointment Place into the left eye 2 (two) times daily. 05/05/21   Chase Picket, MD  ?diclofenac sodium (VOLTAREN) 1 % GEL Apply 1 application topically daily as needed (pain).    [provider]  ?flecainide (TAMBOCOR) 50 MG tablet Take 1 tablet (50 mg total) by mouth 2 (two) times daily. 03/21/21   Freada Bergeron, MD  ?fluticasone Asencion Islam) 50 MCG/ACT nasal spray Place 2 sprays into both nostrils daily as needed for allergies or rhinitis.     [provider]   ?furosemide (LASIX) 20 MG tablet Take 1 tablet (20 mg total) by mouth daily. 12/20/20   Freada Bergeron, MD  ?levothyroxine (SYNTHROID) 112 MCG tablet Take 112 mcg by mouth daily before breakfast.    [provider]  ?levothyroxine (SYNTHROID) 125 MCG tablet Take 125 mcg by mouth every morning. 01/22/20   [provider]  ?Menthol (HONEY LEMON COUGH DROPS MT) Use as directed 1 lozenge in the mouth or throat every 2 (two) hours as needed (sore throat).    [provider]  ?metoprolol succinate (TOPROL-XL) 25 MG 24 hr tablet Take 3 tablets (75 mg total) by mouth daily. 03/21/21   Freada Bergeron, MD  ?Multiple Vitamins-Minerals (OCUVITE PO) Take 1 tablet by mouth daily.    [provider]  ?nitrofurantoin, macrocrystal-monohydrate, (MACROBID) 100 MG capsule Take 1 capsule (100 mg total) by mouth 2 (two) times daily. Take for 5 days only for UTI. 04/13/20   Dorothy Spark, MD  ?potassium chloride (KLOR-CON) 10 MEQ tablet Take 1 tablet (10 mEq total) by mouth daily. 03/21/21   Freada Bergeron, MD  ?vitamin B-12 (CYANOCOBALAMIN) 100 MCG tablet Take 100 mcg by mouth daily.    [provider]  ?   ? ?Allergies    ?Morphine and related, Penicillins, Lasix [furosemide], Aspirin, Clindamycin/lincomycin, Crestor [rosuvastatin], Diltiazem, Erythromycin, Lisinopril, Metronidazole, and Penicillins cross reactors   ? ?Review of Systems   ?  Review of Systems  ?All other systems reviewed and are negative. ? ?Physical Exam ?Updated Vital Signs ?BP (!) 138/91   Pulse (!) 112   Temp 98.4 ?F (36.9 ?C) (Oral)   Resp (!) 21   Ht '5\' 3"'$  (1.6 m)   Wt 86.6 kg   SpO2 95%   BMI 33.83 kg/m?  ?Physical Exam ?Vitals and nursing note reviewed.  ?Constitutional:   ?   General: She is not in acute distress. ?   Appearance: She is well-developed.  ?HENT:  ?   Head: Normocephalic and atraumatic.  ?Eyes:  ?   Conjunctiva/sclera: Conjunctivae normal.  ?Cardiovascular:  ?   Rate and  Rhythm: Regular rhythm. Tachycardia present.  ?Pulmonary:  ?   Effort: Pulmonary effort is normal. No respiratory distress.  ?   Breath sounds: Normal breath sounds. No stridor.  ?Abdominal:  ?   General: There is no distension.  ?Skin: ?   General: Skin is warm and dry.  ?Neurological:  ?   Mental Status: She is alert and oriented to person, place, and time.  ?   Cranial Nerves: No cranial nerve deficit.  ?Psychiatric:     ?   Mood and Affect: Mood normal.  ? ? ?ED Results / Procedures / Treatments   ?Labs ?(all labs ordered are listed, but only abnormal results are displayed) ?Labs Reviewed  ?CBC - Abnormal; Notable for the following components:  ?    Result Value  ? Hemoglobin 15.8 (*)   ? HCT 47.4 (*)   ? Platelets 124 (*)   ? All other components within normal limits  ?BASIC METABOLIC PANEL  ?MAGNESIUM  ?TSH  ?T4, FREE  ?BRAIN NATRIURETIC PEPTIDE  ? ? ?EKG ?EKG Interpretation ? ?Date/Time:  Sunday September 24 2021 11:22:24 EDT ?Ventricular Rate:  118 ?PR Interval:    ?QRS Duration: 186 ?QT Interval:  429 ?QTC Calculation: 602 ?R Axis:   -87 ?Text Interpretation: wide complex QRS tachycardia aflutter? vs stach w ivcd Abnormal ECG Confirmed by Carmin Muskrat [phone]) on 09/24/2021 11:35:42 AM ? ?Radiology ?No results found. ? ?Procedures ?.Sedation ? ?Date/Time: 09/24/2021 1:12 PM ?Performed by: Carmin Muskrat, MD ?Authorized by: Carmin Muskrat, MD  ? ?Consent:  ?  Consent obtained:  Verbal ?  Consent given by:  Patient ?  Risks discussed:  Allergic reaction, dysrhythmia, inadequate sedation, vomiting and nausea ?  Alternatives discussed:  Anxiolysis ?Universal protocol:  ?  Procedure explained and questions answered to patient or proxy's satisfaction: yes   ?  Relevant documents present and verified: yes   ?  Test results available: yes   ?  Required blood products, implants, devices, and special equipment available: yes   ?  Site/side marked: yes   ?  Immediately prior to procedure, a time out was called: yes    ?  Patient identity confirmed:  Verbally with patient ?Indications:  ?  Procedure performed:  Cardioversion ?  Procedure necessitating sedation performed by:  Different physician ?Pre-sedation assessment:  ?  Time since last food or drink:  16 ?  ASA classification: class 2 - patient with mild systemic disease   ?  Mouth opening:  2 finger widths ?  Thyromental distance:  2 finger widths ?  Mallampati score:  II - soft palate, uvula, fauces visible ?  Neck mobility: reduced   ?  Pre-sedation assessments completed and reviewed: airway patency, cardiovascular function, hydration status, mental status, nausea/vomiting, pain level, respiratory function and temperature   ?  Pre-sedation  assessment completed:  09/24/2021 12:45 PM ?Immediate pre-procedure details:  ?  Reassessment: Patient reassessed immediately prior to procedure   ?  Reviewed: vital signs   ?  Verified: bag valve mask available, emergency equipment available, intubation equipment available, IV patency confirmed, oxygen available, reversal medications available and suction available   ?Procedure details (see MAR for exact dosages):  ?  Preoxygenation:  Nasal cannula ?  Sedation:  Propofol ?  Intended level of sedation: deep ?  Intra-procedure monitoring:  Blood pressure monitoring, cardiac monitor, continuous pulse oximetry, continuous capnometry, frequent vital sign checks and frequent LOC assessments ?  Intra-procedure events: none   ?  Total Provider sedation time (minutes):  20 ?Post-procedure details:  ?  Post-sedation assessment completed:  09/24/2021 1:14 PM ?  Attendance: Constant attendance by certified staff until patient recovered   ?  Recovery: Patient returned to pre-procedure baseline   ?  Post-sedation assessments completed and reviewed: airway patency, cardiovascular function, hydration status, mental status, nausea/vomiting, pain level, respiratory function and temperature   ?  Patient is stable for discharge or admission: yes   ?   Procedure completion:  Tolerated well, no immediate complications  ? ? ?Medications Ordered in ED ?Medications - No data to display ? ?ED Course/ Medical Decision Making/ A&P ?This patient with a Hx of A-fib with rapid v

## 2021-09-24 NOTE — Discharge Instructions (Signed)
Please be sure to follow-up with your cardiology team in the coming weeks.  You can expect a call tomorrow to schedule this appointment, and if you do not receive one, please call to ensure appropriate ongoing outpatient care.  Return here for concerning changes in your condition. ?

## 2021-09-24 NOTE — Consult Note (Signed)
?Cardiology Consultation:  ? ?Patient ID: [name] ?MRN: [id number]; DOB: 03-Jan-1928 ? ?Admit date: 09/24/2021 ?Date of Consult: 09/24/2021 ? ?PCP:  Aretta Nip, MD ?  ?[facility] Providers ?Cardiologist:  Ena Dawley, MD  ?Electrophysiologist:  Thompson Grayer, MD     ? ? ?Patient Profile:  ? ?[name] is a 88 y.o. female with a hx of atrial fibrillation and sinus node dysfunction with previously implanted pacemaker who is being seen 09/24/2021 for the evaluation of tachycardia at the request of Dr Vanita Panda. ? ?History of Present Illness:  ? ?Ms. Holton seen in the emergency room with tachypalpitations and shortness of breath.  Interrogation of her [place] device demonstrated an atrial flutter with an atrial cycle length of 280 ms.   ? ?Takes flecainide and Eliquis for anticoagulation.  Has not missed any doses. ? ?Has had intermittent shortness of breath. ? ?Echo 423 demonstrates normal LV function  moderate aortic stenosis with a mean gradient of 25 ? ?Past Medical History:  ?Diagnosis Date  ? Aortic stenosis 03/08/2016  ? Brady-tachy syndrome ([place])   ? a. Biotronik dual chamber PPM implanted 2009 b. gen change to STJ dual chamber PPM 2017  ? Ejection fraction   ? EF 70%, echo, October, 2009  //   EF 55-60%, septal dyssynergy consistent with a paced rhythm, mild to moderate mitral regurgitation, echo, November, 2015   ? GERD (gastroesophageal reflux disease) 04/28/2014  ? Episodes November, 2015 with fluid refluxing from her esophagus.   ? Hair loss   ? Patient questioned Coumadin, changed toPradaxa  ? Hypertension   ? Hypothyroidism   ? Lower extremity edema 01/22/2018  ? Mitral regurgitation 05/03/2014  ? Mild-to-moderate mitral regurgitation, echo, November, 2015   ? Osteoarthritis of left knee 10/25/2017  ? Paroxysmal atrial fibrillation ([facility])   ? Episodes rapid atrial fib noted by pacemaker interrogation, August, 2011, diltiazem added, patient improved. Patient continues on Rythmol  //    Changed to flecainide 2013  //   flecainide level checked in 2013, good level   ? Persistent atrial fibrillation ([place])   ? ? ?Past Surgical History:  ?Procedure Laterality Date  ? ABDOMINAL HYSTERECTOMY    ? EP IMPLANTABLE DEVICE N/A 08/08/2015  ? Generator change with SJM Assurity DR PPM by Dr Rayann Heman  ? JOINT REPLACEMENT    ? PACEMAKER PLACEMENT  2009  ?    ?  ? ?Home Medications:  ?Prior to Admission medications   ?Medication Sig Start Date End Date Taking? Authorizing Provider  ?acetaminophen (TYLENOL) 500 MG tablet Take 500 mg by mouth daily.    [provider]  ?apixaban (ELIQUIS) 5 MG TABS tablet Take 1 tablet (5 mg total) by mouth 2 (two) times daily. 06/28/21   Freada Bergeron, MD  ?Apoaequorin (PREVAGEN PO) Take 1 capsule by mouth daily.    [provider]  ?Calcium Carbonate-Vit D-Min (CALCIUM 600+D PLUS MINERALS) 600-400 MG-UNIT TABS Take 1 tablet by mouth daily.    [provider]  ?cholecalciferol (VITAMIN D3) 25 MCG (1000 UNIT) tablet Take 1,000 Units by mouth daily.    [provider]  ?ciprofloxacin (CILOXAN) 0.3 % ophthalmic ointment Place into the left eye 2 (two) times daily. 05/05/21   Chase Picket, MD  ?diclofenac sodium (VOLTAREN) 1 % GEL Apply 1 application topically daily as needed (pain).    [provider]  ?flecainide (TAMBOCOR) 50 MG tablet Take 1 tablet (50 mg total) by mouth 2 (two) times daily.  03/21/21   Freada Bergeron, MD  ?fluticasone Asencion Islam) 50 MCG/ACT nasal spray Place 2 sprays into both nostrils daily as needed for allergies or rhinitis.     [provider]  ?furosemide (LASIX) 20 MG tablet Take 1 tablet (20 mg total) by mouth daily. 12/20/20   Freada Bergeron, MD  ?levothyroxine (SYNTHROID) 112 MCG tablet Take 112 mcg by mouth daily before breakfast.    [provider]  ?levothyroxine (SYNTHROID) 125 MCG tablet Take 125 mcg by mouth every morning. 01/22/20   [provider]  ?Menthol  (HONEY LEMON COUGH DROPS MT) Use as directed 1 lozenge in the mouth or throat every 2 (two) hours as needed (sore throat).    [provider]  ?metoprolol succinate (TOPROL-XL) 25 MG 24 hr tablet Take 3 tablets (75 mg total) by mouth daily. 03/21/21   Freada Bergeron, MD  ?Multiple Vitamins-Minerals (OCUVITE PO) Take 1 tablet by mouth daily.    [provider]  ?nitrofurantoin, macrocrystal-monohydrate, (MACROBID) 100 MG capsule Take 1 capsule (100 mg total) by mouth 2 (two) times daily. Take for 5 days only for UTI. 04/13/20   Dorothy Spark, MD  ?potassium chloride (KLOR-CON) 10 MEQ tablet Take 1 tablet (10 mEq total) by mouth daily. 03/21/21   Freada Bergeron, MD  ?vitamin B-12 (CYANOCOBALAMIN) 100 MCG tablet Take 100 mcg by mouth daily.    [provider]  ? ? ?Inpatient Medications: ?Scheduled Meds: ? propofol  60 mg Intravenous Once  ? ?Continuous Infusions: ? ?PRN Meds: ? ? ?Allergies:    ? ? ?Social History:   ?Social History  ? ?Socioeconomic History  ? Marital status: Widowed  ?  Spouse name: Not on file  ? Number of children: Not on file  ? Years of education: Not on file  ? Highest education level: Not on file  ?Occupational History  ? Not on file  ?Tobacco Use  ? Smoking status: Former  ?  Types: Cigarettes  ?  Quit date: 09/06/1990  ?  Years since quitting: 31.0  ? Smokeless tobacco: Never  ?Vaping Use  ? Vaping Use: Never used  ?Substance and Sexual Activity  ? Alcohol use: No  ? Drug use: No  ? Sexual activity: Not on file  ?Other Topics Concern  ? Not on file  ?Social History Narrative  ? Not on file  ? ?Social Determinants of Health  ? ?Financial Resource Strain: Not on file  ?Food Insecurity: Not on file  ?Transportation Needs: Not on file  ?Physical Activity: Not on file  ?Stress: Not on file  ?Social Connections: Not on file  ?Intimate Partner Violence: Not on file  ?  ?Family History:   ? ?Family History  ?Problem Relation Age of Onset  ? Heart disease  Father   ? Coronary artery disease Other   ? Heart disease Son   ?  ? ?ROS:  ?Please see the history of present illness.  ? ?All other ROS reviewed and negative.    ? ?Physical Exam/Data:  ? ?Vitals:  ? 09/24/21 1130 09/24/21 1145 09/24/21 1200 09/24/21 1215  ?BP: (!) 138/91 124/85 (!) 110/93 103/68  ?Pulse: (!) 112 (!) 113 (!) 103 (!) 116  ?Resp: (!) '21 14 20 15  '$ ?Temp:      ?TempSrc:      ?SpO2: 95% 94% 94% 95%  ?Weight:      ?Height:      ? ?No intake or output data in the 24 hours  ending 09/24/21 1247 ? ?  09/24/2021  ? 11:16 AM 10/20/2020  ?  4:27 PM 09/25/2020  ?  8:39 PM  ?Last 3 Weights  ?Weight (lbs) 191 lb 189 lb 12.8 oz 198 lb 6.6 oz  ?Weight (kg) 86.637 kg 86.093 kg 90 kg  ?   ?Body mass index is 33.83 kg/m?.  ?General:  Well nourished, well developed, in no acute distress ?HEENT: normal ?Neck: no JVD ?Vascular: No carotid bruits; Distal pulses 2+ bilaterally ?Cardiac: Rapid and regular rate 2/6 systolic murmur normal S1, S2;   ?Lungs:  clear to auscultation bilaterally, no wheezing, rhonchi or rales  ?Abd: soft, nontender, no hepatomegaly  ?Ext: no edema, compression stockings ?Musculoskeletal:  No deformities, BUE and BLE strength normal and equal ?Skin: warm and dry  ?Neuro:  CNs 2-12 intact, no focal abnormalities noted ?Psych:  Normal affect  ? ?EKG:  The EKG was personally reviewed and demonstrates: Tachycardia as well as ventricular pacing suggestive of tracking and blanking ?Telemetry:  Telemetry was personally reviewed and demonstrates:   ?Device interrogation demonstrated atrial flutter at 280 ms.  Unable to pace terminate.  ? ?  ? ?Laboratory Data: ? ?High Sensitivity Troponin:  No results for input(s): TROPONINIHS in the last 720 hours.   ?Chemistry ?Recent Labs  ?Lab 09/24/21 ?1121  ?NA 142  ?K 4.1  ?CL 107  ?CO2 27  ?GLUCOSE 110*  ?BUN 15  ?CREATININE 0.93  ?CALCIUM 9.7  ?MG 2.0  ?GFRNONAA 58*  ?ANIONGAP 8  ?  ?No results for input(s): PROT, ALBUMIN, AST, ALT, ALKPHOS, BILITOT in the last 168  hours. ?Lipids No results for input(s): CHOL, TRIG, HDL, LABVLDL, LDLCALC, CHOLHDL in the last 168 hours.  ?Hematology ?Recent Labs  ?Lab 09/24/21 ?1121  ?WBC 5.1  ?RBC 4.86  ?HGB 15.8*  ?HCT 47.4*  ?MCV 97.5  ?[place]

## 2021-09-24 NOTE — ED Triage Notes (Signed)
Pt arrives via [facility] from [place] for c/o afib RVR. Pt reportedly woke up this morning feeling irregular and fast HR. Pt took 25 mg metoprolol this morning just prior to EMS arrival.   ? ?#20 R AC by EMS ? ?EMS last VS - 158/84, HR 132, RR 18, 94% on RA, CBG 139 ?

## 2021-10-09 DIAGNOSIS — L239 Allergic contact dermatitis, unspecified cause: Secondary | ICD-10-CM | POA: Diagnosis not present

## 2021-10-09 DIAGNOSIS — Z0182 Encounter for allergy testing: Secondary | ICD-10-CM | POA: Diagnosis not present

## 2021-10-12 DIAGNOSIS — L239 Allergic contact dermatitis, unspecified cause: Secondary | ICD-10-CM | POA: Diagnosis not present

## 2021-10-13 ENCOUNTER — Ambulatory Visit (INDEPENDENT_AMBULATORY_CARE_PROVIDER_SITE_OTHER): Payer: Medicare Other

## 2021-10-13 DIAGNOSIS — I495 Sick sinus syndrome: Secondary | ICD-10-CM

## 2021-10-13 LAB — CUP PACEART REMOTE DEVICE CHECK
Battery Remaining Longevity: 56 mo
Battery Remaining Percentage: 46 %
Battery Voltage: 2.98 V
Brady Statistic AP VP Percent: 3.2 %
Brady Statistic AP VS Percent: 96 %
Brady Statistic AS VP Percent: 1 %
Brady Statistic AS VS Percent: 1 %
Brady Statistic RA Percent Paced: 99 %
Brady Statistic RV Percent Paced: 3.2 %
Date Time Interrogation Session: 20230505040013
Implantable Lead Implant Date: 20091109
Implantable Lead Implant Date: 20091109
Implantable Lead Location: 753859
Implantable Lead Location: 753860
Implantable Lead Model: 350
Implantable Lead Serial Number: 24891460
Implantable Lead Serial Number: 28411861
Implantable Pulse Generator Implant Date: 20170227
Lead Channel Impedance Value: 1300 Ohm
Lead Channel Impedance Value: 460 Ohm
Lead Channel Pacing Threshold Amplitude: 0.75 V
Lead Channel Pacing Threshold Amplitude: 1 V
Lead Channel Pacing Threshold Pulse Width: 0.4 ms
Lead Channel Pacing Threshold Pulse Width: 0.4 ms
Lead Channel Sensing Intrinsic Amplitude: 12 mV
Lead Channel Sensing Intrinsic Amplitude: 2.1 mV
Lead Channel Setting Pacing Amplitude: 2 V
Lead Channel Setting Pacing Amplitude: 2.5 V
Lead Channel Setting Pacing Pulse Width: 0.4 ms
Lead Channel Setting Sensing Sensitivity: 2 mV
Pulse Gen Model: 2240
Pulse Gen Serial Number: 7885781

## 2021-10-16 ENCOUNTER — Ambulatory Visit: Payer: Medicare Other | Admitting: Physician Assistant

## 2021-10-19 ENCOUNTER — Encounter: Payer: Self-pay | Admitting: Physician Assistant

## 2021-10-19 ENCOUNTER — Ambulatory Visit (INDEPENDENT_AMBULATORY_CARE_PROVIDER_SITE_OTHER): Payer: Medicare Other | Admitting: Physician Assistant

## 2021-10-19 VITALS — BP 140/60 | HR 64 | Ht 63.0 in | Wt 194.4 lb

## 2021-10-19 DIAGNOSIS — I480 Paroxysmal atrial fibrillation: Secondary | ICD-10-CM

## 2021-10-19 DIAGNOSIS — I495 Sick sinus syndrome: Secondary | ICD-10-CM | POA: Insufficient documentation

## 2021-10-19 DIAGNOSIS — R1319 Other dysphagia: Secondary | ICD-10-CM | POA: Insufficient documentation

## 2021-10-19 DIAGNOSIS — E781 Pure hyperglyceridemia: Secondary | ICD-10-CM | POA: Insufficient documentation

## 2021-10-19 DIAGNOSIS — Z950 Presence of cardiac pacemaker: Secondary | ICD-10-CM

## 2021-10-19 DIAGNOSIS — Z860100 Personal history of colon polyps, unspecified: Secondary | ICD-10-CM | POA: Insufficient documentation

## 2021-10-19 DIAGNOSIS — I359 Nonrheumatic aortic valve disorder, unspecified: Secondary | ICD-10-CM | POA: Insufficient documentation

## 2021-10-19 DIAGNOSIS — M8588 Other specified disorders of bone density and structure, other site: Secondary | ICD-10-CM | POA: Insufficient documentation

## 2021-10-19 DIAGNOSIS — I350 Nonrheumatic aortic (valve) stenosis: Secondary | ICD-10-CM

## 2021-10-19 DIAGNOSIS — I10 Essential (primary) hypertension: Secondary | ICD-10-CM | POA: Diagnosis not present

## 2021-10-19 DIAGNOSIS — I509 Heart failure, unspecified: Secondary | ICD-10-CM | POA: Insufficient documentation

## 2021-10-19 DIAGNOSIS — I5032 Chronic diastolic (congestive) heart failure: Secondary | ICD-10-CM

## 2021-10-19 DIAGNOSIS — R32 Unspecified urinary incontinence: Secondary | ICD-10-CM | POA: Insufficient documentation

## 2021-10-19 DIAGNOSIS — L309 Dermatitis, unspecified: Secondary | ICD-10-CM | POA: Insufficient documentation

## 2021-10-19 NOTE — Patient Instructions (Signed)
Medication Instructions:  ?Your physician recommends that you continue on your current medications as directed. Please refer to the Current Medication list given to you today. ? ?*If you need a refill on your cardiac medications before your next appointment, please call your pharmacy* ? ? ?Lab Work: ?None ordered  ? ?If you have labs (blood work) drawn today and your tests are completely normal, you will receive your results only by: ?MyChart Message (if you have MyChart) OR ?A paper copy in the mail ?If you have any lab test that is abnormal or we need to change your treatment, we will call you to review the results. ? ? ?Testing/Procedures: ?None ordered  ? ? ?Follow-Up: ?Follow up as scheduled  :1}  ? ? ?Other Instructions ? ?Important Information About Sugar ? ? ? ? ?  ?

## 2021-10-19 NOTE — Progress Notes (Signed)
?Cardiology Office Note:   ? ?Date:  10/19/2021  ? ?ID:  [name], DOB [birth year], MRN [id number] ? ?PCP:  Aretta Nip, MD ?  ?[facility] Providers ?Cardiologist:  Freada Bergeron, MD ?Electrophysiologist:   Grayer, MD    ? ?Referring MD: Aretta Nip, MD  ? ?Post ER follow up ? ?History of Present Illness:   ? ?[name] is a 88 y.o. female with a hx of paroxysmal atrial fibrillation on flecainide and Pradaxa, bradycardia tachy syndrome status post pacemaker in 2009 with generator change to [facility] dual-chamber 2017, hypertension, hypothyroidism, aortic stenosis and chronic diastolic CHF who presents to clinic for follow up.  ? ?Previously followed by Dr. Meda Coffee but transferred care to Dr. Johney Frame when Dr. Meda Coffee relocated. Last seen by Dr. Johney Frame on 10/20/20 and Pradaxa was changed to [place].  ? ?Most recent echo on 09/19/21 showed EF 70-75%, severe concentric LVH, severe MAC and moderate AS with a mean gradient of 25.2 mm hg (felt to be 2/2 AS and high output state). ? ?She was seen in the ER on 09/24/21 for evaluation of palpitations. She was found to be in atrial flutter with 2-1 conduction and cardioverted in the ER. She was discharged home. ? ?Today the patient presents to clinic for follow up. She is here with her daughter. She lives at [facility] [place]. She has limited mobility and walks with a walker. 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 No CP or SOB. No LE edema, orthopnea or PND. No dizziness or syncope. No blood in stool or urine. No more palpitations since ER visit. ? ? ? ?Past Medical History:  ?Diagnosis Date  ? Aortic stenosis 03/08/2016  ? Brady-tachy syndrome ([place])   ? a. Biotronik dual chamber PPM implanted 2009 b. gen change to STJ dual chamber PPM 2017  ? Ejection fraction   ? EF 70%, echo, October, 2009  //   EF 55-60%, septal dyssynergy consistent with a paced rhythm, mild to moderate mitral regurgitation, echo, November, 2015   ? GERD  (gastroesophageal reflux disease) 04/28/2014  ? Episodes November, 2015 with fluid refluxing from her esophagus.   ? Hair loss   ? Patient questioned Coumadin, changed toPradaxa  ? Hypertension   ? Hypothyroidism   ? Lower extremity edema 01/22/2018  ? Mitral regurgitation 05/03/2014  ? Mild-to-moderate mitral regurgitation, echo, November, 2015   ? Osteoarthritis of left knee 10/25/2017  ? Paroxysmal atrial fibrillation (HCC)   ? Episodes rapid atrial fib noted by pacemaker interrogation, August, 2011, diltiazem added, patient improved. Patient continues on Rythmol  //   Changed to flecainide 2013  //   flecainide level checked in 2013, good level   ? Persistent atrial fibrillation ([place])   ? ? ?Past Surgical History:  ?Procedure Laterality Date  ? ABDOMINAL HYSTERECTOMY    ? EP IMPLANTABLE DEVICE N/A 08/08/2015  ? Generator change with SJM Assurity DR PPM by Dr Rayann Heman  ? JOINT REPLACEMENT    ? PACEMAKER PLACEMENT  2009  ?    ? ? ?Current Medications: ?Current Meds  ?Medication Sig  ? acetaminophen (TYLENOL) 500 MG tablet Take 500 mg by mouth daily.  ? apixaban (ELIQUIS) 5 MG TABS tablet Take 1 tablet (5 mg total) by mouth 2 (two) times daily.  ? Apoaequorin (PREVAGEN PO) Take 1 capsule by mouth daily.  ? Calcium Carbonate-Vit D-Min (CALCIUM 600+D PLUS MINERALS) 600-400 MG-UNIT TABS Take 1 tablet by mouth daily.  ?  cholecalciferol (VITAMIN D3) 25 MCG (1000 UNIT) tablet Take 1,000 Units by mouth daily.  ? ciprofloxacin (CILOXAN) 0.3 % ophthalmic ointment Place into the left eye 2 (two) times daily.  ? diclofenac sodium (VOLTAREN) 1 % GEL Apply 1 application topically daily as needed (pain).  ? flecainide (TAMBOCOR) 50 MG tablet Take 1 tablet (50 mg total) by mouth 2 (two) times daily.  ? fluticasone (CUTIVATE) 0.05 % cream Apply 1 application. topically 2 (two) times daily as needed for rash.  ? fluticasone (FLONASE) 50 MCG/ACT nasal spray Place 2 sprays into both nostrils daily as needed for allergies or rhinitis.   ?  furosemide (LASIX) 20 MG tablet Take 1 tablet (20 mg total) by mouth daily.  ? levothyroxine (SYNTHROID) 125 MCG tablet Take 125 mcg by mouth every morning.  ? Menthol (HONEY LEMON COUGH DROPS MT) Use as directed 1 lozenge in the mouth or throat every 2 (two) hours as needed (sore throat).  ? metoprolol succinate (TOPROL-XL) 25 MG 24 hr tablet Take 3 tablets (75 mg total) by mouth daily.  ? Multiple Vitamins-Minerals (OCUVITE PO) Take 1 tablet by mouth daily.  ? nitrofurantoin, macrocrystal-monohydrate, (MACROBID) 100 MG capsule Take 1 capsule (100 mg total) by mouth 2 (two) times daily. Take for 5 days only for UTI.  ? potassium chloride (KLOR-CON) 10 MEQ tablet Take 1 tablet (10 mEq total) by mouth daily.  ? vitamin B-12 (CYANOCOBALAMIN) 100 MCG tablet Take 100 mcg by mouth daily.  ?  ? ?Allergies:   Morphine and related, Penicillins, Lasix [furosemide], Aspirin, Clindamycin/lincomycin, Crestor [rosuvastatin], Diltiazem, Erythromycin, Lisinopril, Metronidazole, and Penicillins cross reactors  ? ?Social History  ? ?Socioeconomic History  ? Marital status: Widowed  ?  Spouse name: Not on file  ? Number of children: Not on file  ? Years of education: Not on file  ? Highest education level: Not on file  ?Occupational History  ? Not on file  ?Tobacco Use  ? Smoking status: Former  ?  Types: Cigarettes  ?  Quit date: 09/06/1990  ?  Years since quitting: 31.1  ? Smokeless tobacco: Never  ?Vaping Use  ? Vaping Use: Never used  ?Substance and Sexual Activity  ? Alcohol use: No  ? Drug use: No  ? Sexual activity: Not on file  ?Other Topics Concern  ? Not on file  ?Social History Narrative  ? Not on file  ? ?Social Determinants of Health  ? ?Financial Resource Strain: Not on file  ?Food Insecurity: Not on file  ?Transportation Needs: Not on file  ?Physical Activity: Not on file  ?Stress: Not on file  ?Social Connections: Not on file  ?  ? ?Family History: ?The patient's family history includes Coronary artery disease in an  other family member; Heart disease in her father and son. ? ?ROS:   ?Please see the history of present illness.    ?All other systems reviewed and are negative. ? ?EKGs/Labs/Other Studies Reviewed:   ? ?The following studies were reviewed today: ? ?TTE 09/20/20: ?IMPRESSIONS  ? 1. Left ventricular ejection fraction, by estimation, is 70 to 75%. The  ?left ventricle has hyperdynamic function. The left ventricle has no  ?regional wall motion abnormalities. There is severe concentric left  ?ventricular hypertrophy. Diastolic function  ?indeterminant due to severe MAC. The average left ventricular global  ?longitudinal strain is -19.6 %. The global longitudinal strain is normal.  ? 2. Right ventricular systolic function is normal. The right ventricular  ?size is normal. There is  normal pulmonary artery systolic pressure. The  ?estimated right ventricular systolic pressure is 92.4 mmHg.  ? 3. Left atrial size was mildly dilated.  ? 4. The mitral valve is abnormal. Mild mitral valve regurgitation. Severe  ?mitral annular calcification.  ? 5. The aortic valve is calcified. There is moderate calcification of the  ?aortic valve. There is moderate thickening of the aortic valve. Aortic  ?valve regurgitation is not visualized. Mild to moderate vs moderate aortic  ?valve stenosis. Aortic valve area,  ? by VTI measures 1.65 cm?Marland Kitchen Aortic valve mean gradient measures 25.5 mmHg.  ?Aortic valve Vmax measures 3.26 m/s. DI 0.4. Elevated gradients are due to  ?both aortic stenosis and high output state (LVOT VTI 34.6).  ? ?Comparison(s): Compared to prior TTE on 09/20/20, there is no significant  ?change. There continues to be moderate aortic stenosis with mean gradient  ?63mHg on prior study.  ? ?Lower extremity dopplers 09/10/19 ?RIGHT:  ?- No evidence of deep vein thrombosis in the lower extremity. No indirect  ?evidence of obstruction proximal to the inguinal ligament.  ?  ?LEFT:  ?- There is no evidence of deep vein thrombosis in the  lower extremity.  ?- No cystic structure found in the popliteal fossa.  ?   ?  ?EKG:  EKG is NOT ordered today.  ? ?Recent Labs: ?09/24/2021: B Natriuretic Peptide 237.1; BUN 15; Creatinine, Ser 0.93; Hemoglobin 15

## 2021-10-26 NOTE — Progress Notes (Signed)
Remote pacemaker transmission.   

## 2021-10-27 DIAGNOSIS — Z23 Encounter for immunization: Secondary | ICD-10-CM | POA: Diagnosis not present

## 2021-11-01 ENCOUNTER — Ambulatory Visit (INDEPENDENT_AMBULATORY_CARE_PROVIDER_SITE_OTHER): Payer: Medicare Other | Admitting: Internal Medicine

## 2021-11-01 VITALS — BP 132/64 | HR 60 | Ht 63.0 in | Wt 194.0 lb

## 2021-11-01 DIAGNOSIS — D6869 Other thrombophilia: Secondary | ICD-10-CM | POA: Diagnosis not present

## 2021-11-01 DIAGNOSIS — I480 Paroxysmal atrial fibrillation: Secondary | ICD-10-CM | POA: Diagnosis not present

## 2021-11-01 DIAGNOSIS — I495 Sick sinus syndrome: Secondary | ICD-10-CM | POA: Diagnosis not present

## 2021-11-01 DIAGNOSIS — I350 Nonrheumatic aortic (valve) stenosis: Secondary | ICD-10-CM | POA: Diagnosis not present

## 2021-11-01 NOTE — Progress Notes (Signed)
PCP: Aretta Nip, MD Primary Cardiologist: Dr Meda Coffee Primary EP:  Dr Noland Fordyce is a 88 y.o. female who presents today for routine electrophysiology followup.  Since last being seen in our clinic, the patient reports doing very well.  She has had several episodes of AF recently.  She was not very symptomatic, but was very scared.  [facility]. Today, she denies symptoms of palpitations, chest pain, shortness of breath,  lower extremity edema, dizziness, presyncope, or syncope.  The patient is otherwise without complaint today.   Past Medical History:  Diagnosis Date   Aortic stenosis 03/08/2016   Brady-tachy syndrome ([place])    a. Biotronik dual chamber PPM implanted 2009 b. gen change to STJ dual chamber PPM 2017   Ejection fraction    EF 70%, echo, October, 2009  //   EF 55-60%, septal dyssynergy consistent with a paced rhythm, mild to moderate mitral regurgitation, echo, November, 2015    GERD (gastroesophageal reflux disease) 04/28/2014   Episodes November, 2015 with fluid refluxing from her esophagus.    Hair loss    Patient questioned Coumadin, changed toPradaxa   Hypertension    Hypothyroidism    Lower extremity edema 01/22/2018   Mitral regurgitation 05/03/2014   Mild-to-moderate mitral regurgitation, echo, November, 2015    Osteoarthritis of left knee 10/25/2017   Paroxysmal atrial fibrillation (HCC)    Episodes rapid atrial fib noted by pacemaker interrogation, August, 2011, diltiazem added, patient improved. Patient continues on Rythmol  //   Changed to flecainide 2013  //   flecainide level checked in 2013, good level    Persistent atrial fibrillation ([place])    Past Surgical History:  Procedure Laterality Date   ABDOMINAL HYSTERECTOMY     EP IMPLANTABLE DEVICE N/A 08/08/2015   Generator change with SJM Assurity DR PPM by Dr Rayann Heman   JOINT REPLACEMENT     PACEMAKER PLACEMENT  2009        ROS- all systems are reviewed  and negative except as per HPI above  Current Outpatient Medications  Medication Sig Dispense Refill   acetaminophen (TYLENOL) 500 MG tablet Take 500 mg by mouth daily.     apixaban (ELIQUIS) 5 MG TABS tablet Take 1 tablet (5 mg total) by mouth 2 (two) times daily. 180 tablet 0   Apoaequorin (PREVAGEN PO) Take 1 capsule by mouth daily.     Calcium Carbonate-Vit D-Min (CALCIUM 600+D PLUS MINERALS) 600-400 MG-UNIT TABS Take 1 tablet by mouth daily.     cholecalciferol (VITAMIN D3) 25 MCG (1000 UNIT) tablet Take 1,000 Units by mouth daily.     ciprofloxacin (CILOXAN) 0.3 % ophthalmic ointment Place into the left eye 2 (two) times daily. 3.5 g 0   diclofenac sodium (VOLTAREN) 1 % GEL Apply 1 application topically daily as needed (pain).     flecainide (TAMBOCOR) 50 MG tablet Take 1 tablet (50 mg total) by mouth 2 (two) times daily. 180 tablet 2   fluticasone (CUTIVATE) 0.05 % cream Apply 1 application. topically 2 (two) times daily as needed for rash.     fluticasone (FLONASE) 50 MCG/ACT nasal spray Place 2 sprays into both nostrils daily as needed for allergies or rhinitis.      furosemide (LASIX) 20 MG tablet Take 1 tablet (20 mg total) by mouth daily. 90 tablet 1   levothyroxine (SYNTHROID) 125 MCG tablet Take 125 mcg by mouth every morning.  Menthol (HONEY LEMON COUGH DROPS MT) Use as directed 1 lozenge in the mouth or throat every 2 (two) hours as needed (sore throat).     metoprolol succinate (TOPROL-XL) 25 MG 24 hr tablet Take 3 tablets (75 mg total) by mouth daily. 270 tablet 2   Multiple Vitamins-Minerals (OCUVITE PO) Take 1 tablet by mouth daily.     nitrofurantoin, macrocrystal-monohydrate, (MACROBID) 100 MG capsule Take 1 capsule (100 mg total) by mouth 2 (two) times daily. Take for 5 days only for UTI. 10 capsule 0   potassium chloride (KLOR-CON) 10 MEQ tablet Take 1 tablet (10 mEq total) by mouth daily. 90 tablet 2   vitamin B-12 (CYANOCOBALAMIN) 100 MCG tablet Take 100 mcg by mouth  daily.     No current facility-administered medications for this visit.    Physical Exam: Vitals:   11/01/21 1649  BP: 132/64  Pulse: 60  SpO2: 92%  Weight: 194 lb (88 kg)  Height: '5\' 3"'$  (1.6 m)     GEN- The patient is well appearing, alert and oriented x 3 today.   Head- normocephalic, atraumatic Eyes-  Sclera clear, conjunctiva pink Ears- hearing intact Oropharynx- clear Lungs- Clear to ausculation bilaterally, normal work of breathing Chest- pacemaker pocket is well healed Heart- Regular rate and rhythm, no murmurs, rubs or gallops, PMI not laterally displaced GI- soft, NT, ND, + BS Extremities- no clubbing, cyanosis, or edema  Pacemaker interrogation- reviewed in detail today,  See PACEART report  ekg tracing ordered today is personally reviewed and shows A paced 60 bpm, PR 276 msec, LVH  Assessment and Plan:  1. Symptomatic bradycardia Normal pacemaker function See Pace Art report No changes today she is not device dependant today  2. Afib Well controled with flecainide Afib burden is <1 % She has had several AF events recently.  Though she was not very symptomatic, she was sent to the ER by [facility].  I have discussed at length today, that we should be able to manage her AF without the ER.  I have reassured her and family today. I have advised that they can increase flecainide to '100mg'$  BID if AF increases. She can also take an additional toprol during AF episodes. I would like for her to follow closely in the AF clinic for further discussions and to reduce AF related anxiety.  As her overall AF burden is very low, I am relucant to increase flecainide or consider a different AAD (amiodarone) at this time.  3. HTN Stable No change required today  4. Chronic diastolic dysfunction Stable No change required today  5. Moderate AS Follows with TAVR team (their note is reviewed)  Risks, benefits and potential toxicities for medications prescribed and/or  refilled reviewed with patient today.   Follow-up in AF clinic in 2 months  Thompson Grayer MD, [facility] 11/01/2021 4:51 PM

## 2021-11-01 NOTE — Patient Instructions (Signed)
Medication Instructions:  Your physician recommends that you continue on your current medications as directed. Please refer to the Current Medication list given to you today.  *If you need a refill on your cardiac medications before your next appointment, please call your pharmacy*   Lab Work: None ordered If you have labs (blood work) drawn today and your tests are completely normal, you will receive your results only by: [place] (if you have MyChart) OR A paper copy in the mail If you have any lab test that is abnormal or we need to change your treatment, we will call you to review the results.   Testing/Procedures: None ordered   Follow-Up: At [facility], you and your health needs are our priority.  As part of our continuing mission to provide you with exceptional heart care, we have created designated Provider Care Teams.  These Care Teams include your primary Cardiologist (physician) and Advanced Practice Providers (APPs -  Physician Assistants and Nurse Practitioners) who all work together to provide you with the care you need, when you need it.  We recommend signing up for the patient portal called "MyChart".  Sign up information is provided on this After Visit Summary.  MyChart is used to connect with patients for Virtual Visits (Telemedicine).  Patients are able to view lab/test results, encounter notes, upcoming appointments, etc.  Non-urgent messages can be sent to your provider as well.   To learn more about what you can do with MyChart, go to [email or web address].    Remote monitoring is used to monitor your Pacemaker or ICD from home. This monitoring reduces the number of office visits required to check your device to one time per year. It allows [place] to keep an eye on the functioning of your device to ensure it is working properly. You are scheduled for a device check from home on 01/12/2022. You may send your transmission at any time that day. If you have a wireless  device, the transmission will be sent automatically. After your physician reviews your transmission, you will receive a postcard with your next transmission date.  Your next appointment:   2 month(s)  The format for your next appointment:   In Person  Provider:   You will follow up in the [facility] located at [facility]. Your provider will be: Roderic Palau, NP or Clint R. Fenton, PA-C   Thank you for choosing [facility]!!   Forde Dandy, RN [phone]    Other Instructions  If you go into A-Fib, you can take an extra Flecainide; ( If you haven't taken your last dose in the last few hours. )  AND / OR, you can take your ( Toprol ) with your extra Flecainide.      Important Information About Sugar

## 2021-11-02 NOTE — Addendum Note (Signed)
Addended by: Drue Novel I on: 11/02/2021 05:37 PM   Modules accepted: Orders

## 2021-11-13 ENCOUNTER — Other Ambulatory Visit: Payer: Self-pay | Admitting: Internal Medicine

## 2021-12-11 ENCOUNTER — Other Ambulatory Visit: Payer: Self-pay

## 2021-12-11 ENCOUNTER — Other Ambulatory Visit: Payer: Self-pay | Admitting: *Deleted

## 2021-12-11 DIAGNOSIS — I480 Paroxysmal atrial fibrillation: Secondary | ICD-10-CM

## 2021-12-11 MED ORDER — APIXABAN 5 MG PO TABS: 5.0000 mg | ORAL_TABLET | Freq: Two times a day (BID) | ORAL | 1 refills | Status: DC

## 2021-12-11 NOTE — Telephone Encounter (Signed)
Eliquis '5mg'$  refill request received. Patient is 88 years old, weight-88kg, Crea-0.93 on 09/24/2021, Diagnosis-Afib, and last seen by Dr. Rayann Heman on 11/01/2021. Dose is appropriate based on dosing criteria. Will send in refill to requested pharmacy.

## 2021-12-11 NOTE — Telephone Encounter (Signed)
This refill request is a duplicate; please see other encounter for details.

## 2021-12-14 DIAGNOSIS — L400 Psoriasis vulgaris: Secondary | ICD-10-CM | POA: Diagnosis not present

## 2021-12-14 DIAGNOSIS — L239 Allergic contact dermatitis, unspecified cause: Secondary | ICD-10-CM | POA: Diagnosis not present

## 2021-12-14 DIAGNOSIS — D22112 Melanocytic nevi of right lower eyelid, including canthus: Secondary | ICD-10-CM | POA: Diagnosis not present

## 2021-12-18 ENCOUNTER — Telehealth: Payer: Self-pay | Admitting: Cardiology

## 2021-12-18 MED ORDER — FLECAINIDE ACETATE 50 MG PO TABS: 50.0000 mg | ORAL_TABLET | Freq: Two times a day (BID) | ORAL | 3 refills | Status: DC

## 2021-12-18 MED ORDER — POTASSIUM CHLORIDE CRYS ER 10 MEQ PO TBCR
10.0000 meq | EXTENDED_RELEASE_TABLET | Freq: Every day | ORAL | 3 refills | Status: DC
Start: 2021-12-18 — End: 2022-07-10

## 2021-12-18 MED ORDER — METOPROLOL SUCCINATE ER 25 MG PO TB24: 75.0000 mg | ORAL_TABLET | Freq: Every day | ORAL | 3 refills | Status: DC

## 2021-12-18 NOTE — Telephone Encounter (Signed)
Pt's medications were sent to pt's pharmacy as requested. Confirmation received.  

## 2021-12-18 NOTE — Telephone Encounter (Signed)
*  STAT* If patient is at the pharmacy, call can be transferred to refill team.   1. Which medications need to be refilled? (please list name of each medication and dose if known)   flecainide (TAMBOCOR) 50 MG tablet    metoprolol succinate (TOPROL-XL) 25 MG 24 hr tablet    potassium chloride (KLOR-CON) 10 MEQ tablet    2. Which pharmacy/location (including street and city if local pharmacy) is medication to be sent to? [facility] [place], [place] - [address]  3. Do they need a 30 day or 90 day supply?  90 day   Pt's daughter would like for nurse to call back regarding pt's medication list. Please advise

## 2021-12-18 NOTE — Telephone Encounter (Signed)
Spoke with pt's daughter,DPR who states she will be taking over management of pt's medications due to patients memory difficulties.  Reviewed current medications with pt's daughter.  Pt's daughter verbalizes understanding.  Will forward refill requests to refill department.  Pt's daughter thanked Therapist, sports for the phone call.

## 2022-01-02 DIAGNOSIS — H18513 Endothelial corneal dystrophy, bilateral: Secondary | ICD-10-CM | POA: Diagnosis not present

## 2022-01-02 DIAGNOSIS — H02421 Myogenic ptosis of right eyelid: Secondary | ICD-10-CM | POA: Diagnosis not present

## 2022-01-02 DIAGNOSIS — Z961 Presence of intraocular lens: Secondary | ICD-10-CM | POA: Diagnosis not present

## 2022-01-02 DIAGNOSIS — H10533 Contact blepharoconjunctivitis, bilateral: Secondary | ICD-10-CM | POA: Diagnosis not present

## 2022-01-02 DIAGNOSIS — H16223 Keratoconjunctivitis sicca, not specified as Sjogren's, bilateral: Secondary | ICD-10-CM | POA: Diagnosis not present

## 2022-01-03 ENCOUNTER — Encounter (HOSPITAL_COMMUNITY): Payer: Self-pay | Admitting: Nurse Practitioner

## 2022-01-03 ENCOUNTER — Ambulatory Visit (HOSPITAL_COMMUNITY)
Admission: RE | Admit: 2022-01-03 | Discharge: 2022-01-03 | Disposition: A | Discharge status: Home / Self Care | Payer: Medicare Other | Source: Ambulatory Visit | Attending: Nurse Practitioner | Admitting: Nurse Practitioner

## 2022-01-03 VITALS — BP 166/78 | HR 61 | Ht 63.0 in | Wt 194.6 lb

## 2022-01-03 DIAGNOSIS — Z7901 Long term (current) use of anticoagulants: Secondary | ICD-10-CM | POA: Insufficient documentation

## 2022-01-03 DIAGNOSIS — D6869 Other thrombophilia: Secondary | ICD-10-CM

## 2022-01-03 DIAGNOSIS — Z79899 Other long term (current) drug therapy: Secondary | ICD-10-CM | POA: Insufficient documentation

## 2022-01-03 DIAGNOSIS — I110 Hypertensive heart disease with heart failure: Secondary | ICD-10-CM | POA: Diagnosis not present

## 2022-01-03 DIAGNOSIS — I480 Paroxysmal atrial fibrillation: Secondary | ICD-10-CM | POA: Diagnosis not present

## 2022-01-03 DIAGNOSIS — R001 Bradycardia, unspecified: Secondary | ICD-10-CM | POA: Insufficient documentation

## 2022-01-03 DIAGNOSIS — E039 Hypothyroidism, unspecified: Secondary | ICD-10-CM | POA: Diagnosis not present

## 2022-01-03 DIAGNOSIS — I5032 Chronic diastolic (congestive) heart failure: Secondary | ICD-10-CM | POA: Insufficient documentation

## 2022-01-03 NOTE — Progress Notes (Signed)
Primary Care Physician: Aretta Nip, MD Primary Cardiologist: Dr Johney Frame Primary Electrophysiologist: Dr Rayann Heman Referring Physician: [facility]   [name] is a 88 y.o. female with a history of paroxysmal atrial fibrillation, bradycardia tachy syndrome status post PPM, hypertension, hypothyroidism, chronic diastolic CHF. Patient is on pradaxa for a CHADS2VASC score of 4. She was seen at the ED 12/21/19 for sudden onset palpitations and SOB, similar to her afib episodes in the past. Patient had been taking flecainide only once per day. She denied any bleeding issues on anticoagulation.   F/u in afib clinic, 01/03/22 at the request of Dr. Rayann Heman. She had a 09/24/21 ED visit for atrial flutter and was cardioverted. She was minimally symptomatic. Dr. Rayann Heman  wants her followed in the afib clinic to avoid ED visits and for afib anxiety. She lives in a retirement facility and was sent form there to the ED after last occurrence of afib. She has been changed at some point to eliquis 5 mg bid  for a CHA2DS2VASc  score of 5. EKG today shows a paced rhythm. She states that she will notice afib rarely, mostly at night and it will usually resolve in several hours without any additional meds.   Today, she denies symptoms of palpitations, chest pain, shortness of breath, orthopnea, PND, lower extremity edema, dizziness, presyncope, syncope, snoring, daytime somnolence, bleeding, or neurologic sequela. The patient is tolerating medications without difficulties and is otherwise without complaint today.    Atrial Fibrillation Risk Factors:  she does not have symptoms or diagnosis of sleep apnea. she does not have a history of rheumatic fever.  she has a BMI of There is no height or weight on file to calculate BMI.. There were no vitals filed for this visit.   Family History  Problem Relation Age of Onset   Heart disease Father    Coronary artery disease Other    Heart disease Son       Atrial Fibrillation Management history:  Previous antiarrhythmic drugs: flecainide Previous cardioversions: none Previous ablations: none CHADS2VASC score: 4 Anticoagulation history: Pradaxa   Past Medical History:  Diagnosis Date   Aortic stenosis 03/08/2016   Brady-tachy syndrome ([place])    a. Biotronik dual chamber PPM implanted 2009 b. gen change to STJ dual chamber PPM 2017   Ejection fraction    EF 70%, echo, October, 2009  //   EF 55-60%, septal dyssynergy consistent with a paced rhythm, mild to moderate mitral regurgitation, echo, November, 2015    GERD (gastroesophageal reflux disease) 04/28/2014   Episodes November, 2015 with fluid refluxing from her esophagus.    Hair loss    Patient questioned Coumadin, changed toPradaxa   Hypertension    Hypothyroidism    Lower extremity edema 01/22/2018   Mitral regurgitation 05/03/2014   Mild-to-moderate mitral regurgitation, echo, November, 2015    Osteoarthritis of left knee 10/25/2017   Paroxysmal atrial fibrillation (HCC)    Episodes rapid atrial fib noted by pacemaker interrogation, August, 2011, diltiazem added, patient improved. Patient continues on Rythmol  //   Changed to flecainide 2013  //   flecainide level checked in 2013, good level    Persistent atrial fibrillation ([place])    Past Surgical History:  Procedure Laterality Date   ABDOMINAL HYSTERECTOMY     EP IMPLANTABLE DEVICE N/A 08/08/2015   Generator change with SJM Assurity DR PPM by Dr Rayann Heman   JOINT REPLACEMENT     PACEMAKER PLACEMENT  2009  Current Outpatient Medications  Medication Sig Dispense Refill   acetaminophen (TYLENOL) 500 MG tablet Take 500 mg by mouth daily.     apixaban (ELIQUIS) 5 MG TABS tablet Take 1 tablet (5 mg total) by mouth 2 (two) times daily. 180 tablet 1   Apoaequorin (PREVAGEN PO) Take 1 capsule by mouth daily.     Calcium Carbonate-Vit D-Min (CALCIUM 600+D PLUS MINERALS) 600-400 MG-UNIT TABS Take 1 tablet by mouth daily.      cholecalciferol (VITAMIN D3) 25 MCG (1000 UNIT) tablet Take 1,000 Units by mouth daily.     ciprofloxacin (CILOXAN) 0.3 % ophthalmic ointment Place into the left eye 2 (two) times daily. 3.5 g 0   diclofenac sodium (VOLTAREN) 1 % GEL Apply 1 application topically daily as needed (pain).     flecainide (TAMBOCOR) 50 MG tablet Take 1 tablet (50 mg total) by mouth 2 (two) times daily. 180 tablet 3   fluticasone (CUTIVATE) 0.05 % cream Apply 1 application. topically 2 (two) times daily as needed for rash.     fluticasone (FLONASE) 50 MCG/ACT nasal spray Place 2 sprays into both nostrils daily as needed for allergies or rhinitis.      furosemide (LASIX) 20 MG tablet Take 1 tablet (20 mg total) by mouth daily. 90 tablet 1   levothyroxine (SYNTHROID) 125 MCG tablet Take 125 mcg by mouth every morning.     Menthol (HONEY LEMON COUGH DROPS MT) Use as directed 1 lozenge in the mouth or throat every 2 (two) hours as needed (sore throat).     metoprolol succinate (TOPROL-XL) 25 MG 24 hr tablet Take 3 tablets (75 mg total) by mouth daily. 270 tablet 3   Multiple Vitamins-Minerals (OCUVITE PO) Take 1 tablet by mouth daily.     nitrofurantoin, macrocrystal-monohydrate, (MACROBID) 100 MG capsule Take 1 capsule (100 mg total) by mouth 2 (two) times daily. Take for 5 days only for UTI. 10 capsule 0   potassium chloride (KLOR-CON M) 10 MEQ tablet Take 1 tablet (10 mEq total) by mouth daily. 90 tablet 3   vitamin B-12 (CYANOCOBALAMIN) 100 MCG tablet Take 100 mcg by mouth daily.     No current facility-administered medications for this encounter.    Allergies  Allergen Reactions   Morphine And Related Other (See Comments)    Patient states that she went "crazy" with this   Penicillins Swelling and Rash       Lasix [Furosemide] Itching    Patient stated it made her itch all over and dry.   Aspirin     Pacemaker   Clindamycin/Lincomycin Other (See Comments)    Any meds with mycin in the name Unknown  reaction   Diltiazem     Skin discoloration, lower extremity swelling   Erythromycin Other (See Comments)    Any meds with mycin in the name Unknown reaction   Lisinopril Other (See Comments)    REACTION: Cough   Penicillins Cross Reactors Other (See Comments)    Any meds with mycin in the name Unknown reaction    Social History   Socioeconomic History   Marital status: Widowed    Spouse name: Not on file   Number of children: Not on file   Years of education: Not on file   Highest education level: Not on file  Occupational History   Not on file  Tobacco Use   Smoking status: Former    Types: Cigarettes    Quit date: 09/06/1990    Years since quitting: 73.3  Smokeless tobacco: Never  Vaping Use   Vaping Use: Never used  Substance and Sexual Activity   Alcohol use: No   Drug use: No   Sexual activity: Not on file  Other Topics Concern   Not on file  Social History Narrative   Not on file   Social Determinants of Health   Financial Resource Strain: Not on file  Food Insecurity: Not on file  Transportation Needs: Not on file  Physical Activity: Not on file  Stress: Not on file  Social Connections: Not on file  Intimate Partner Violence: Not on file     ROS- All systems are reviewed and negative except as per the HPI above.  Physical Exam: There were no vitals filed for this visit.   GEN- The patient is well appearing elderly obese female, alert and oriented x 3 today.   HEENT-head normocephalic, atraumatic, sclera clear, conjunctiva pink, hearing intact, trachea midline. Lungs- Clear to ausculation bilaterally, normal work of breathing Heart- Regular rate and rhythm, no rubs or gallops. 2/6 systolic murmur  GI- soft, NT, ND, + BS Extremities- no clubbing, cyanosis, or edema MS- no significant deformity or atrophy Skin- no rash or lesion Psych- euthymic mood, full affect Neuro- strength and sensation are intact   Wt Readings from Last 3 Encounters:   11/01/21 88 kg  10/19/21 88.2 kg  09/24/21 86.6 kg    EKG today demonstrates Vent. rate 61 BPM PR interval 298 ms QRS duration 90 ms QT/QTcB 440/442 ms P-R-T axes 83 28 240 Atrial-paced rhythm with prolonged AV conduction Minimal voltage criteria for LVH, may be normal variant ( Sokolow-Lyon ) Marked ST abnormality, possible inferolateral subendocardial injury Abnormal ECG When compared with ECG of 24-Sep-2021 11:22, PREVIOUS ECG IS PRESENT  10/13/21-Scheduled remote reviewed. Normal device function.    AMS episodes for AF/flutter, longest duration 4/17 8hrs, 60mn  Burden <1%, Eliquis, Metoprolol  Next remote 91 days.   Echo 07/16/19 demonstrated  1. Left ventricular ejection fraction, by visual estimation, is 70 to  75%. The left ventricle has hyperdynamic function. There is moderately  increased left ventricular hypertrophy.   2. Elevated left atrial pressure.   3. Left ventricular diastolic parameters are consistent with Grade I  diastolic dysfunction (impaired relaxation).   4. The left ventricle has no regional wall motion abnormalities.   5. Global right ventricle has normal systolic function.The right  ventricular size is normal. No increase in right ventricular wall  thickness.   6. Left atrial size was moderately dilated.   7. Right atrial size was normal.   8. Moderate thickening of the mitral valve leaflet(s).   9. Severe mitral annular calcification.  10. The mitral valve is normal in structure. Mild mitral valve  regurgitation. No evidence of mitral stenosis.  11. The tricuspid valve is normal in structure.  12. The tricuspid valve is normal in structure. Tricuspid valve  regurgitation is mild.  13. Aortic valve mean gradient measures 26.0 mmHg.  14. The aortic valve is normal in structure. Aortic valve regurgitation is  not visualized. Moderate aortic valve stenosis.  15. There is severe calcifcation of the aortic valve.  16. There is severe thickening of  the aortic valve.  17. The pulmonic valve was normal in structure. Pulmonic valve  regurgitation is not visualized.  18. Normal pulmonary artery systolic pressure.  19. The tricuspid regurgitant velocity is 2.06 m/s, and with an assumed  right atrial pressure of 3 mmHg, the estimated right ventricular systolic  pressure  is normal at 20.0 mmHg.  20. The inferior vena cava is normal in size with greater than 50%  respiratory variability, suggesting right atrial pressure of 3 mmHg.   Epic records are reviewed at length today  CHA2DS2-VASc Score = 5  The patient's score is based upon: CHF History: 1 HTN History: 1 Diabetes History: 0 Stroke History: 0 Vascular Disease History: 0 Age Score: 2 Gender Score: 1   ASSESSMENT AND PLAN: 1. Paroxysmal Atrial Fibrillation (ICD10:  I48.0) The patient's CHA2DS2-VASc score is 5, indicating a 7.2% annual risk of stroke.   Overall afib burden low.   Continue flecainide 50 mg BID. We discussed proper use of medication. Per Dr. Jackalyn Lombard 11/01/21  note, she can take additional Toprol during episodes to prevent from going to the ED or if increased afib burden then can increase flecainide to 100 mg bid or consider change to amiodarone Continue Toprol 75 mg daily Continue eliquis 5 mg BID  2. Secondary Hypercoagulable State (ICD10:  D68.69){ The patient is at significant risk for stroke/thromboembolism based upon her CHA2DS2-VASc Score of 5.  Continue  eliquis   3. Symptomatic bradycardia S/p PPM, followed by Dr Rayann Heman and the device clinic.  4. HTN Stable, no changes today.  Pacer check 01/12/22 F/u with Dr. Johney Frame 11/9   Follow up in the AF clinic  in 6 months, sooner if issues   Butch Penny C. Neamiah Sciarra, [facility] [address] [place], [place] [address] [phone]

## 2022-01-12 ENCOUNTER — Ambulatory Visit (INDEPENDENT_AMBULATORY_CARE_PROVIDER_SITE_OTHER): Payer: Medicare Other

## 2022-01-12 DIAGNOSIS — I495 Sick sinus syndrome: Secondary | ICD-10-CM | POA: Diagnosis not present

## 2022-01-12 LAB — CUP PACEART REMOTE DEVICE CHECK
Battery Remaining Longevity: 54 mo
Battery Remaining Percentage: 44 %
Battery Voltage: 2.98 V
Brady Statistic AP VP Percent: 2.9 %
Brady Statistic AP VS Percent: 96 %
Brady Statistic AS VP Percent: 1 %
Brady Statistic AS VS Percent: 1.1 %
Brady Statistic RA Percent Paced: 99 %
Brady Statistic RV Percent Paced: 3 %
Date Time Interrogation Session: 20230804040014
Implantable Lead Implant Date: 20091109
Implantable Lead Implant Date: 20091109
Implantable Lead Location: 753859
Implantable Lead Location: 753860
Implantable Lead Model: 350
Implantable Lead Serial Number: 24891460
Implantable Lead Serial Number: 28411861
Implantable Pulse Generator Implant Date: 20170227
Lead Channel Impedance Value: 1300 Ohm
Lead Channel Impedance Value: 510 Ohm
Lead Channel Pacing Threshold Amplitude: 0.75 V
Lead Channel Pacing Threshold Amplitude: 0.75 V
Lead Channel Pacing Threshold Pulse Width: 0.4 ms
Lead Channel Pacing Threshold Pulse Width: 0.4 ms
Lead Channel Sensing Intrinsic Amplitude: 12 mV
Lead Channel Sensing Intrinsic Amplitude: 2.1 mV
Lead Channel Setting Pacing Amplitude: 2 V
Lead Channel Setting Pacing Amplitude: 2.5 V
Lead Channel Setting Pacing Pulse Width: 0.4 ms
Lead Channel Setting Sensing Sensitivity: 2 mV
Pulse Gen Model: 2240
Pulse Gen Serial Number: 7885781
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
## 2022-01-22 ENCOUNTER — Emergency Department (HOSPITAL_COMMUNITY): Payer: Medicare Other

## 2022-01-22 ENCOUNTER — Ambulatory Visit: Payer: Medicare Other | Admitting: Cardiology

## 2022-01-22 ENCOUNTER — Emergency Department (HOSPITAL_COMMUNITY)
Admission: EM | Admit: 2022-01-22 | Discharge: 2022-01-22 | Disposition: A | Discharge status: Home / Self Care | Payer: Medicare Other | Attending: Emergency Medicine | Admitting: Emergency Medicine

## 2022-01-22 ENCOUNTER — Other Ambulatory Visit: Payer: Self-pay

## 2022-01-22 ENCOUNTER — Encounter (HOSPITAL_COMMUNITY): Payer: Self-pay | Admitting: Emergency Medicine

## 2022-01-22 DIAGNOSIS — R519 Headache, unspecified: Secondary | ICD-10-CM | POA: Insufficient documentation

## 2022-01-22 DIAGNOSIS — E039 Hypothyroidism, unspecified: Secondary | ICD-10-CM | POA: Diagnosis not present

## 2022-01-22 DIAGNOSIS — Z7901 Long term (current) use of anticoagulants: Secondary | ICD-10-CM | POA: Insufficient documentation

## 2022-01-22 DIAGNOSIS — W19XXXA Unspecified fall, initial encounter: Secondary | ICD-10-CM | POA: Diagnosis not present

## 2022-01-22 DIAGNOSIS — S8392XA Sprain of unspecified site of left knee, initial encounter: Secondary | ICD-10-CM | POA: Diagnosis not present

## 2022-01-22 DIAGNOSIS — M1712 Unilateral primary osteoarthritis, left knee: Secondary | ICD-10-CM | POA: Diagnosis not present

## 2022-01-22 DIAGNOSIS — Z87891 Personal history of nicotine dependence: Secondary | ICD-10-CM | POA: Insufficient documentation

## 2022-01-22 DIAGNOSIS — I10 Essential (primary) hypertension: Secondary | ICD-10-CM | POA: Diagnosis not present

## 2022-01-22 DIAGNOSIS — M25552 Pain in left hip: Secondary | ICD-10-CM | POA: Diagnosis not present

## 2022-01-22 DIAGNOSIS — M79672 Pain in left foot: Secondary | ICD-10-CM | POA: Diagnosis not present

## 2022-01-22 DIAGNOSIS — Z79899 Other long term (current) drug therapy: Secondary | ICD-10-CM | POA: Insufficient documentation

## 2022-01-22 DIAGNOSIS — M25462 Effusion, left knee: Secondary | ICD-10-CM | POA: Diagnosis not present

## 2022-01-22 DIAGNOSIS — S8992XA Unspecified injury of left lower leg, initial encounter: Secondary | ICD-10-CM | POA: Diagnosis present

## 2022-01-22 MED ORDER — LIDOCAINE 5 % EX PTCH: 1.0000 | MEDICATED_PATCH | Freq: Every day | CUTANEOUS | 0 refills | Status: DC | PRN

## 2022-01-22 MED ORDER — ACETAMINOPHEN 325 MG PO TABS: 650.0000 mg | ORAL_TABLET | Freq: Four times a day (QID) | ORAL | 0 refills | Status: DC | PRN

## 2022-01-22 MED ORDER — METHOCARBAMOL 500 MG PO TABS: 500.0000 mg | ORAL_TABLET | Freq: Two times a day (BID) | ORAL | 0 refills | Status: AC

## 2022-01-22 NOTE — ED Provider Notes (Addendum)
[facility] DEPT Provider Note   CSN: [id number] Arrival date & time: 01/22/22  0021     History  Chief Complaint  Patient presents with   Knee Injury    [name] is a 88 y.o. female.  Patient as above with significant medical history as below, including atrial fibrillation on Eliquis, tachybradycardia syndrome status post pacemaker, HTN, mitral regurgitation who presents to the ED with complaint of fall.  Patient reports just prior to arrival she was attempting to kill a spider in her apartment, he was able to dispatch the spider but unfortunately ended up falling to the ground.  She fell onto her left knee.  She twisted her knee as she fell and experienced significant pain to the superior /lateral aspect of her left knee.  She is able to ambulate after the event and went to Bible study but after Bible study she was unable to make it back to her apartment due to significant leg pain.  Unable to bear weight on the left leg.  Is having pain to left knee and also left hip.  He is also having pain to her left third, fourth toes.  She is unsure if she struck her head.  Last dose of Eliquis was yesterday morning.  No numbness or tingling, no headache, no vision changes, no nausea or vomiting, no dyspnea.  Normal state of health prior to fall     Past Medical History:  Diagnosis Date   Aortic stenosis 03/08/2016   Brady-tachy syndrome ([place])    a. Biotronik dual chamber PPM implanted 2009 b. gen change to STJ dual chamber PPM 2017   Ejection fraction    EF 70%, echo, October, 2009  //   EF 55-60%, septal dyssynergy consistent with a paced rhythm, mild to moderate mitral regurgitation, echo, November, 2015    GERD (gastroesophageal reflux disease) 04/28/2014   Episodes November, 2015 with fluid refluxing from her esophagus.    Hair loss    Patient questioned Coumadin, changed toPradaxa   Hypertension    Hypothyroidism    Lower extremity edema 01/22/2018    Mitral regurgitation 05/03/2014   Mild-to-moderate mitral regurgitation, echo, November, 2015    Osteoarthritis of left knee 10/25/2017   Paroxysmal atrial fibrillation (HCC)    Episodes rapid atrial fib noted by pacemaker interrogation, August, 2011, diltiazem added, patient improved. Patient continues on Rythmol  //   Changed to flecainide 2013  //   flecainide level checked in 2013, good level    Persistent atrial fibrillation ([place])     Past Surgical History:  Procedure Laterality Date   ABDOMINAL HYSTERECTOMY     EP IMPLANTABLE DEVICE N/A 08/08/2015   Generator change with [employer] DR PPM by Dr Rayann Heman   JOINT REPLACEMENT     PACEMAKER PLACEMENT  2009         The history is provided by the patient and a relative. No language interpreter was used.       Home Medications Prior to Admission medications   Medication Sig Start Date End Date Taking? Authorizing Provider  acetaminophen (TYLENOL) 325 MG tablet Take 2 tablets (650 mg total) by mouth every 6 (six) hours as needed. 01/22/22  Yes Wynona Dove A, DO  acetaminophen (TYLENOL) 500 MG tablet Take 1,000 mg by mouth daily.   Yes [provider]  apixaban (ELIQUIS) 5 MG TABS tablet Take 1 tablet (5 mg total) by mouth 2 (two) times daily. 12/11/21  Yes Thompson Grayer, MD  Apoaequorin (PREVAGEN PO) Take 1 capsule by mouth daily.   Yes [provider]  Calcium Carbonate-Vit D-Min (CALCIUM 600+D PLUS MINERALS) 600-400 MG-UNIT TABS Take 1 tablet by mouth daily.   Yes [provider]  cholecalciferol (VITAMIN D3) 25 MCG (1000 UNIT) tablet Take 1,000 Units by mouth daily.   Yes [provider]  flecainide (TAMBOCOR) 50 MG tablet Take 1 tablet (50 mg total) by mouth 2 (two) times daily. 12/18/21  Yes Freada Bergeron, MD  furosemide (LASIX) 20 MG tablet Take 1 tablet (20 mg total) by mouth daily. 12/20/20  Yes Freada Bergeron, MD  levothyroxine (SYNTHROID) 100 MCG tablet Take 100 mcg by mouth every  morning. 12/13/21  Yes [provider]  lidocaine (LIDODERM) 5 % Place 1 patch onto the skin daily as needed. Remove & Discard patch within 12 hours or as directed by MD 01/22/22  Yes Jeanell Sparrow, DO  methocarbamol (ROBAXIN) 500 MG tablet Take 1 tablet (500 mg total) by mouth 2 (two) times daily for 5 days. 01/22/22 01/27/22 Yes Wynona Dove A, DO  metoprolol succinate (TOPROL-XL) 25 MG 24 hr tablet Take 3 tablets (75 mg total) by mouth daily. 12/18/21  Yes Freada Bergeron, MD  Multiple Vitamins-Minerals (OCUVITE PO) Take 1 tablet by mouth daily.   Yes [provider]  potassium chloride (KLOR-CON M) 10 MEQ tablet Take 1 tablet (10 mEq total) by mouth daily. 12/18/21  Yes Freada Bergeron, MD      Allergies    Morphine and related, Penicillins, Lasix [furosemide], Aspirin, Clindamycin/lincomycin, Crestor [rosuvastatin], Diltiazem, Erythromycin, Lisinopril, Metronidazole, and Penicillins cross reactors    Review of Systems   Review of Systems  Constitutional:  Negative for activity change and fever.  HENT:  Negative for facial swelling and trouble swallowing.   Eyes:  Negative for discharge and redness.  Respiratory:  Negative for cough and shortness of breath.   Cardiovascular:  Negative for chest pain and palpitations.  Gastrointestinal:  Negative for abdominal pain and nausea.  Genitourinary:  Negative for dysuria and flank pain.  Musculoskeletal:  Positive for arthralgias and gait problem. Negative for back pain.  Skin:  Negative for pallor and rash.  Neurological:  Negative for syncope and headaches.    Physical Exam Updated Vital Signs BP (!) 144/54   Pulse 66   Temp 97.9 F (36.6 C) (Oral)   Resp 19   Ht '5\' 3"'$  (1.6 m)   Wt 88.9 kg   SpO2 91%   BMI 34.72 kg/m  Physical Exam Vitals and nursing note reviewed.  Constitutional:      General: She is not in acute distress.    Appearance: Normal appearance. She is not ill-appearing.  HENT:     Head:  Normocephalic and atraumatic. No raccoon eyes, Battle's sign, right periorbital erythema or left periorbital erythema.     Jaw: There is normal jaw occlusion. No trismus.     Right Ear: External ear normal.     Left Ear: External ear normal.     Nose: Nose normal.     Mouth/Throat:     Mouth: Mucous membranes are moist.  Eyes:     General: No scleral icterus.       Right eye: No discharge.        Left eye: No discharge.  Cardiovascular:     Rate and Rhythm: Normal rate and regular rhythm.     Pulses: Normal pulses.     Heart sounds: Normal heart  sounds.  Pulmonary:     Effort: Pulmonary effort is normal. No respiratory distress.     Breath sounds: Normal breath sounds.  Abdominal:     General: Abdomen is flat.     Tenderness: There is no abdominal tenderness.  Musculoskeletal:        General: Normal range of motion.     Cervical back: Full passive range of motion without pain and normal range of motion.     Right lower leg: No edema.     Left lower leg: No edema.     Comments: Achilles tendon intact bilateral No Significant discomfort to varus and valgus testing of the left knee. No discomfort to range of motion of the left ankle There is some bruising to the left third and fourth toes but there is full range of motion. DP pulses equal bilateral Capillary refill is brisk to feet bilateral Sensation intact lower extremities bilateral equal and symmetric TTP to acetabulum left and superior aspect of left knee.  Quad  tendon appears intact No sig laxity to anterior/posterior drawer    Skin:    General: Skin is warm and dry.     Capillary Refill: Capillary refill takes less than 2 seconds.  Neurological:     Mental Status: She is alert and oriented to person, place, and time. Mental status is at baseline.     GCS: GCS eye subscore is 4. GCS verbal subscore is 5. GCS motor subscore is 6.     Cranial Nerves: Cranial nerves 2-12 are intact. No dysarthria.     Sensory: Sensation is  intact.     Motor: Motor function is intact. No tremor.     Coordination: Coordination is intact.  Psychiatric:        Mood and Affect: Mood normal.        Behavior: Behavior normal.     ED Results / Procedures / Treatments   Labs (all labs ordered are listed, but only abnormal results are displayed) Labs Reviewed - No data to display  EKG None  Radiology CT Knee Left Wo Contrast  Result Date: 01/22/2022 CLINICAL DATA:  Status post trauma. EXAM: CT OF THE LEFT KNEE WITHOUT CONTRAST TECHNIQUE: Multidetector CT imaging of the left knee was performed according to the standard protocol. Multiplanar CT image reconstructions were also generated. RADIATION DOSE REDUCTION: This exam was performed according to the departmental dose-optimization program which includes automated exposure control, adjustment of the mA and/or kV according to patient size and/or use of iterative reconstruction technique. COMPARISON:  None Available. FINDINGS: Bones/Joint/Cartilage There is no evidence of acute fracture or dislocation. Medial and lateral marginal osteophyte formation is seen. There is marked severity medial tibiofemoral compartment space narrowing. Moderate severity lateral tibiofemoral and patellofemoral narrowing is also noted. A moderate sized joint effusion is seen. Ligaments Suboptimally assessed by CT. Muscles and Tendons Limited in evaluation in the absence of intravenous contrast and otherwise unremarkable. Soft tissues Moderate to marked severity vascular calcification is seen. IMPRESSION: 1. No evidence of acute osseous abnormality. 2. Moderate severity tricompartmental osteoarthritis, as described above. 3. Moderate sized joint effusion. Electronically Signed   By: Virgina Norfolk M.D.   On: 01/22/2022 04:18   CT Hip Left Wo Contrast  Result Date: 01/22/2022 CLINICAL DATA:  Status post trauma. EXAM: CT OF THE LEFT HIP WITHOUT CONTRAST TECHNIQUE: Multidetector CT imaging of the left hip was  performed according to the standard protocol. Multiplanar CT image reconstructions were also generated. RADIATION DOSE REDUCTION: This exam  was performed according to the departmental dose-optimization program which includes automated exposure control, adjustment of the mA and/or kV according to patient size and/or use of iterative reconstruction technique. COMPARISON:  None Available. FINDINGS: Bones/Joint/Cartilage There is no evidence of acute fracture or dislocation. Mild degenerative changes are seen in the form of joint space narrowing, acetabular sclerosis and lateral acetabular bony spurring. Ligaments Suboptimally assessed by CT. Muscles and Tendons Limited in evaluation in the absence of intravenous contrast and otherwise unremarkable. Soft tissues Unremarkable. Other Of incidental note is the presence of a 3.3 cm x 3.3 cm x 3.2 cm left adnexal cyst. Numerous noninflamed diverticula are seen within the visualized portion of the sigmoid colon. IMPRESSION: 1. No evidence of acute fracture or dislocation. 2. Mild degenerative changes of the left hip. 3. 3.3 cm x 3.3 cm x 3.2 cm left adnexal cyst. Recommend prompt follow-up with pelvic US. Reference: JACR 2020 Feb;17(2):248-254 4. Sigmoid diverticulosis. Electronically Signed   By: Virgina Norfolk M.D.   On: 01/22/2022 04:12   CT Head Wo Contrast  Result Date: 01/22/2022 CLINICAL DATA:  Recent fall with headaches, initial encounter EXAM: CT HEAD WITHOUT CONTRAST TECHNIQUE: Contiguous axial images were obtained from the base of the skull through the vertex without intravenous contrast. RADIATION DOSE REDUCTION: This exam was performed according to the departmental dose-optimization program which includes automated exposure control, adjustment of the mA and/or kV according to patient size and/or use of iterative reconstruction technique. COMPARISON:  09/25/2020 FINDINGS: Brain: No evidence of acute infarction, hemorrhage, hydrocephalus, extra-axial  collection or mass lesion/mass effect. Mild atrophic changes are noted. Chronic white matter ischemic changes are seen as well. Vascular: No hyperdense vessel or unexpected calcification. Skull: Normal. Negative for fracture or focal lesion. Sinuses/Orbits: No acute finding. Other: None. IMPRESSION: Chronic atrophic and ischemic changes without acute abnormality. Electronically Signed   By: Inez Catalina M.D.   On: 01/22/2022 03:54   DG Knee Complete 4 Views Left  Result Date: 01/22/2022 CLINICAL DATA:  Fall with left hip pain. EXAM: LEFT KNEE - COMPLETE 4+ VIEW; LEFT FOOT - COMPLETE 3+ VIEW; DG HIP (WITH OR WITHOUT PELVIS) 2-3V LEFT COMPARISON:  None Available. FINDINGS: Suboptimal image detail due to body habitus and underpenetrated technique. There is osteopenia without evidence of displaced fractures of the AP pelvis and proximal left femur. There is no widening of the SI joints and pubic symphysis. There is bilateral symmetric degenerative arthrosis of the hips, mild spurring SI joints. Degenerative change visualized lower lumbar spine. Surrounding soft tissues are unremarkable. There is tubing overlying the upper left thigh. IMPRESSION: Limited exam showing no evidence of displaced fractures. No hip dislocation. Osteopenia and degenerative change. Electronically Signed   By: Telford Nab M.D.   On: 01/22/2022 01:25   DG Foot Complete Left  Result Date: 01/22/2022 CLINICAL DATA:  Fall with left hip pain. EXAM: LEFT KNEE - COMPLETE 4+ VIEW; LEFT FOOT - COMPLETE 3+ VIEW; DG HIP (WITH OR WITHOUT PELVIS) 2-3V LEFT COMPARISON:  None Available. FINDINGS: Suboptimal image detail due to body habitus and underpenetrated technique. There is osteopenia without evidence of displaced fractures of the AP pelvis and proximal left femur. There is no widening of the SI joints and pubic symphysis. There is bilateral symmetric degenerative arthrosis of the hips, mild spurring SI joints. Degenerative change visualized  lower lumbar spine. Surrounding soft tissues are unremarkable. There is tubing overlying the upper left thigh. IMPRESSION: Limited exam showing no evidence of displaced fractures. No hip dislocation. Osteopenia  and degenerative change. Electronically Signed   By: Telford Nab M.D.   On: 01/22/2022 01:25   DG Hip Unilat W or Wo Pelvis 2-3 Views Left  Result Date: 01/22/2022 CLINICAL DATA:  Fall with left hip pain. EXAM: LEFT KNEE - COMPLETE 4+ VIEW; LEFT FOOT - COMPLETE 3+ VIEW; DG HIP (WITH OR WITHOUT PELVIS) 2-3V LEFT COMPARISON:  None Available. FINDINGS: Suboptimal image detail due to body habitus and underpenetrated technique. There is osteopenia without evidence of displaced fractures of the AP pelvis and proximal left femur. There is no widening of the SI joints and pubic symphysis. There is bilateral symmetric degenerative arthrosis of the hips, mild spurring SI joints. Degenerative change visualized lower lumbar spine. Surrounding soft tissues are unremarkable. There is tubing overlying the upper left thigh. IMPRESSION: Limited exam showing no evidence of displaced fractures. No hip dislocation. Osteopenia and degenerative change. Electronically Signed   By: Telford Nab M.D.   On: 01/22/2022 01:25    Procedures Procedures    Medications Ordered in ED Medications - No data to display  ED Course/ Medical Decision Making/ A&P                           Medical Decision Making Amount and/or Complexity of Data Reviewed Radiology: ordered.  Risk OTC drugs. Prescription drug management.   This patient presents to the ED with chief complaint(s) of fall, left hip and knee pain with pertinent past medical history of A-fib on Eliquis which further complicates the presenting complaint. The complaint involves an extensive differential diagnosis and also carries with it a high risk of complications and morbidity.    The differential diagnosis includes but not limited to sprain, strain, MSK,  fracture, dislocation, ligamentous injury  Differential diagnoses for head trauma includes subdural hematoma, epidural hematoma, acute concussion, traumatic subarachnoid hemorrhage, cerebral contusions, etc.  . Serious etiologies were considered.   The initial plan is to imaging, analgesics   Additional history obtained: Additional history obtained from family Records reviewed Primary Care Documents and prior ED visits, prior labs and imaging  Independent labs interpretation:  The following labs were independently interpreted: N/A  Independent visualization of imaging: - I independently visualized the following imaging with scope of interpretation limited to determining acute life threatening conditions related to emergency care: X-Dirr left foot, hip left, knee left.  These are unremarkable so CTs were obtained., which revealed CT imaging is also unremarkable for fracture.  Arthritis was noted  Cardiac monitoring was reviewed and interpreted by myself which shows N/A  Treatment and Reassessment: Symptoms have improved without acute intervention  Consultation: - Consulted or discussed management/test interpretation w/ external professional: N/A  Consideration for admission or further workup: Admission was considered however given improvement with patient's symptoms and reassuring exam favor outpatient management.  Negative imaging.  Give patient knee brace, she was offered crutches but is able to ambulate without significant difficulty.  Able to bear weight to her affected extremity without significant discomfort.  Advised outpatient follow-up orthopedics if symptoms persist for repeat evaluation.  The patient improved significantly and was discharged in stable condition. Detailed discussions were had with the patient regarding current findings, and need for close f/u with PCP or on call doctor. The patient has been instructed to return immediately if the symptoms worsen in any way for  re-evaluation. Patient verbalized understanding and is in agreement with current care plan. All questions answered prior to discharge.    Social  Determinants of health: Social History   Tobacco Use   Smoking status: Former    Types: Cigarettes    Quit date: 09/06/1990    Years since quitting: 31.4   Smokeless tobacco: Never  Vaping Use   Vaping Use: Never used  Substance Use Topics   Alcohol use: No   Drug use: No            Final Clinical Impression(s) / ED Diagnoses Final diagnoses:  Sprain of left knee, unspecified ligament, initial encounter    Rx / DC Orders ED Discharge Orders          Ordered    lidocaine (LIDODERM) 5 %  Daily PRN        01/22/22 0426    methocarbamol (ROBAXIN) 500 MG tablet  2 times daily        01/22/22 0427    acetaminophen (TYLENOL) 325 MG tablet  Every 6 hours PRN        01/22/22 0427              Jeanell Sparrow, DO 01/22/22 Muscoy, Buckner, DO 01/22/22 [id number]

## 2022-01-22 NOTE — Discharge Instructions (Addendum)
It was a pleasure caring for you today in the emergency department.  Please return to the emergency department for any worsening or worrisome symptoms.  please call orthopedics (Dr Mable Fill) to schedule evaluation in around 1 week.

## 2022-01-22 NOTE — ED Triage Notes (Addendum)
Patient BIB [facility] c/o twisting her left knee going after a spider this evening.  Patient reports pain worsening with twisting and impact.  Patient is taking a blood thinner, did not hit head, no LOC.  Patient remembers entire event.  Patient reports left knee and pain to all 5 left toes.  182/90 70 HR 96%  119 CBG

## 2022-01-23 ENCOUNTER — Ambulatory Visit: Payer: Self-pay

## 2022-01-23 NOTE — Patient Instructions (Signed)
Visit Information  Thank you for taking time to visit with me today. Please don't hesitate to contact me if I can be of assistance to you.   Following are the goals we discussed today:            The patient verbalized understanding of instructions, educational materials, and care plan provided today and DECLINED offer to receive copy of patient instructions, educational materials, and care plan.   [name] will call for care coordination assistance as needed.

## 2022-01-23 NOTE — Patient Outreach (Signed)
  Care Coordination   Initial Visit Note   01/23/2022 Name: [name] MRN: [id number] DOB: 08-17-1927  [name] is a 88 y.o. year old female who sees [name], [name] Salinas, MD for primary care. I spoke with  [name] by phone today  What matters to the patients health and wellness today?  Fall Prevention      SDOH assessments and interventions completed:  Yes  SDOH Interventions Today    Flowsheet Row Most Recent Value  SDOH Interventions   Food Insecurity Interventions Intervention Not Indicated  Transportation Interventions Intervention Not Indicated        Care Coordination Interventions Activated:  Yes  Care Coordination Interventions:  Yes, provided   Follow up plan:  [name] will call for care coordination assistance as needed.    Encounter Outcome:  Pt. Visit Completed   [facility] [phone]

## 2022-01-29 NOTE — Progress Notes (Unsigned)
Cardiology Office Note:    Date:  01/30/2022   ID:  [name], DOB 03-Jan-1928, MRN [id number]  PCP:  Aretta Nip, MD   [place] [facility] Cardiologist:  Freada Bergeron, MD Electrophysiologist:  Thompson Grayer, MD     Referring MD: Aretta Nip, MD   Chief Complaint: follow-up aortic stenosis  History of Present Illness:    [name] is a very pleasant 88 y.o. female with a hx of PAF on chronic anticoagulation and AAD, tachybrady syndrome s/p PPM implant 2009 with generator change to [facility] dual chamber 2017, hypotension, hypothyroidism, HTN, aortic stenosis, chronic HFpEF  Previously seen by Dr. Meda Coffee, transferred care to Dr. Johney Frame when Dr. Meda Coffee relocated. Her Pradaxa for anticoagulation 2/2 a fib was changed to Eliquis.   Echocardiogram 09/19/2021 showed EF 70 to 75%, severe concentric LVH, severe MAC and moderate AS with a mean gradient of 25.2 mmHg (felt to be 2/2 AS and high output state).  She was seen in the ER on 09/24/2021 for evaluation of palpitations and was found to be in atrial flutter with 2-1 conduction and cardioverted in the ER.  She was discharged home.   Seen by Nell Range, PA on 10/19/2021 for ER follow-up.  Noted to be a good TAVR candidate if AS progresses to severe, recommendation to monitor TTE yearly.  Seen by Dr. Rayann Heman on 11/01/2021 with normal pacemaker function, not device dependent at that visit. Fearful of recurrence of a fib. In order to prevent ED visit, he advised her to take additional flecainide 100 mg twice daily if atrial fib increases. Advised she may also take additional Toprol XL.  She was referred to A-fib clinic.  Seen in A-fib clinic by Roderic Palau, NP on 01/03/2022.  Maintained on Eliquis 5 mg twice daily for CHA2DS2-VASc of 5.  Medication instructions as noted above were reviewed, no changes were made and she was advised to follow-up in 6 months with A-fib clinic, sooner if issues arise.  Today, she is  here with her daughter for follow-up. Was taken to ED on 8/14 s/p fall, left knee and leg discomfort. Fall occurred when she was trying to catch a spider that was in her house, no presyncope, syncope. Does not monitor BP at home. She denies palpitations, fast HR, chest pain or SOB. No further episodes of fast HR. Discussed the previous instructions given regarding taking extra Flecainide and metoprolol for elevated HR. She does not have clear understanding.  Has an old prescription for flecainide 100 mg tablets and asks if she should take these in addition to her 50 mg tablets. Reports her short term memory is poor.   Past Medical History:  Diagnosis Date   Aortic stenosis 03/08/2016   Brady-tachy syndrome ([place])    a. Biotronik dual chamber PPM implanted 2009 b. gen change to STJ dual chamber PPM 2017   Ejection fraction    EF 70%, echo, October, 2009  //   EF 55-60%, septal dyssynergy consistent with a paced rhythm, mild to moderate mitral regurgitation, echo, November, 2015    GERD (gastroesophageal reflux disease) 04/28/2014   Episodes November, 2015 with fluid refluxing from her esophagus.    Hair loss    Patient questioned Coumadin, changed toPradaxa   Hypertension    Hypothyroidism    Lower extremity edema 01/22/2018   Mitral regurgitation 05/03/2014   Mild-to-moderate mitral regurgitation, echo, November, 2015    Osteoarthritis of left knee 10/25/2017   Paroxysmal atrial fibrillation (HCC)  Episodes rapid atrial fib noted by pacemaker interrogation, August, 2011, diltiazem added, patient improved. Patient continues on Rythmol  //   Changed to flecainide 2013  //   flecainide level checked in 2013, good level    Persistent atrial fibrillation ([place])     Past Surgical History:  Procedure Laterality Date   ABDOMINAL HYSTERECTOMY     EP IMPLANTABLE DEVICE N/A 08/08/2015   Generator change with [employer] DR PPM by Dr Rayann Heman   JOINT REPLACEMENT     PACEMAKER PLACEMENT  2009         Current Medications: Current Meds  Medication Sig   acetaminophen (TYLENOL) 325 MG tablet Take 2 tablets (650 mg total) by mouth every 6 (six) hours as needed.   acetaminophen (TYLENOL) 500 MG tablet Take 1,000 mg by mouth daily.   apixaban (ELIQUIS) 5 MG TABS tablet Take 1 tablet (5 mg total) by mouth 2 (two) times daily.   Apoaequorin (PREVAGEN PO) Take 1 capsule by mouth daily.   Calcium Carbonate-Vit D-Min (CALCIUM 600+D PLUS MINERALS) 600-400 MG-UNIT TABS Take 1 tablet by mouth daily.   cholecalciferol (VITAMIN D3) 25 MCG (1000 UNIT) tablet Take 1,000 Units by mouth daily.   flecainide (TAMBOCOR) 50 MG tablet Take 1 tablet (50 mg total) by mouth 2 (two) times daily.   furosemide (LASIX) 20 MG tablet Take 1 tablet (20 mg total) by mouth daily.   levothyroxine (SYNTHROID) 100 MCG tablet Take 100 mcg by mouth every morning.   lidocaine (LIDODERM) 5 % Place 1 patch onto the skin daily as needed. Remove & Discard patch within 12 hours or as directed by MD   methocarbamol (ROBAXIN) 500 MG tablet Take 500 mg by mouth 2 (two) times daily.   metoprolol succinate (TOPROL-XL) 25 MG 24 hr tablet Take 3 tablets (75 mg total) by mouth daily.   Multiple Vitamins-Minerals (OCUVITE PO) Take 1 tablet by mouth daily.   potassium chloride (KLOR-CON M) 10 MEQ tablet Take 1 tablet (10 mEq total) by mouth daily.     Allergies:   Morphine and related, Penicillins, Lasix [furosemide], Aspirin, Clindamycin/lincomycin, Crestor [rosuvastatin], Diltiazem, Erythromycin, Lisinopril, Metronidazole, and Penicillins cross reactors   Social History   Socioeconomic History   Marital status: Widowed    Spouse name: Not on file   Number of children: Not on file   Years of education: Not on file   Highest education level: Not on file  Occupational History   Not on file  Tobacco Use   Smoking status: Former    Types: Cigarettes    Quit date: 09/06/1990    Years since quitting: 31.4   Smokeless tobacco: Never   Vaping Use   Vaping Use: Never used  Substance and Sexual Activity   Alcohol use: No   Drug use: No   Sexual activity: Not on file  Other Topics Concern   Not on file  Social History Narrative   Not on file   Social Determinants of Health   Financial Resource Strain: Not on file  Food Insecurity: No Food Insecurity (01/23/2022)   Hunger Vital Sign    Worried About Running Out of Food in the Last Year: Never true    Ran Out of Food in the Last Year: Never true  Transportation Needs: No Transportation Needs (01/23/2022)   PRAPARE - Hydrologist (Medical): No    Lack of Transportation (Non-Medical): No  Physical Activity: Not on file  Stress: Not on file  Social Connections: Not on file     Family History: The patient's family history includes Coronary artery disease in an other family member; Heart disease in her father and son.  ROS:   Please see the history of present illness.  All other systems reviewed and are negative.  Labs/Other Studies Reviewed:    The following studies were reviewed today:  Echo 09/19/21   1. Left ventricular ejection fraction, by estimation, is 70 to 75%. The  left ventricle has hyperdynamic function. The left ventricle has no  regional wall motion abnormalities. There is severe concentric left  ventricular hypertrophy. Diastolic function  indeterminant due to severe MAC. The average left ventricular global  longitudinal strain is -19.6 %. The global longitudinal strain is normal.   2. Right ventricular systolic function is normal. The right ventricular  size is normal. There is normal pulmonary artery systolic pressure. The  estimated right ventricular systolic pressure is 92.1 mmHg.   3. Left atrial size was mildly dilated.   4. The mitral valve is abnormal. Mild mitral valve regurgitation. Severe  mitral annular calcification.   5. The aortic valve is calcified. There is moderate calcification of the  aortic  valve. There is moderate thickening of the aortic valve. Aortic  valve regurgitation is not visualized. Mild to moderate vs moderate aortic  valve stenosis. Aortic valve area,   by VTI measures 1.65 cm. Aortic valve mean gradient measures 25.5 mmHg.  Aortic valve Vmax measures 3.26 m/s. DI 0.4. Elevated gradients are due to  both aortic stenosis and high output state (LVOT VTI 34.6).   Comparison(s): Compared to prior TTE on 09/20/20, there is no significant  change. There continues to be moderate aortic stenosis with mean gradient  64mHg on prior study.   Recent Labs: 09/24/2021: B Natriuretic Peptide 237.1; BUN 15; Creatinine, Ser 0.93; Hemoglobin 15.8; Magnesium 2.0; Platelets 124; Potassium 4.1; Sodium 142; TSH 2.159  Recent Lipid Panel    Component Value Date/Time   CHOL 193 05/13/2017 1224   TRIG 152 (H) 05/13/2017 1224   HDL 41 05/13/2017 1224   CHOLHDL 4.7 (H) 05/13/2017 1224   CHOLHDL 3.9 11/21/2011 1322   VLDL 25 11/21/2011 1322   LDLCALC 122 (H) 05/13/2017 1224     Risk Assessment/Calculations:    CHA2DS2-VASc Score = 5  This indicates a 7.2% annual risk of stroke. The patient's score is based upon: CHF History: 1 HTN History: 1 Diabetes History: 0 Stroke History: 0 Vascular Disease History: 0 Age Score: 2 Gender Score: 1    Physical Exam:    VS:  BP 130/62   Pulse 60   Ht _0  (1.6 m)   Wt 195 lb 6.4 oz (88.6 kg)   SpO2 96%   BMI 34.61 kg/m     Wt Readings from Last 3 Encounters:  01/30/22 195 lb 6.4 oz (88.6 kg)  01/22/22 196 lb (88.9 kg)  01/03/22 194 lb 9.6 oz (88.3 kg)     GEN:  Well nourished, well developed in no acute distress HEENT: Normal NECK: No JVD; No carotid bruits CARDIAC: RRR, no murmurs, rubs, gallops RESPIRATORY:  Clear to auscultation without rales, wheezing or rhonchi  ABDOMEN: Soft, non-tender, non-distended MUSCULOSKELETAL:  No edema; No deformity. 2+ pedal pulses, equal bilaterally SKIN: Warm and dry NEUROLOGIC:   Alert and oriented x 3 PSYCHIATRIC:  Normal affect   EKG:  EKG is ordered today.  The ekg ordered today demonstrates atrial paced rhythm at 60 bpm, criteria for LVH, no  acute change from previous   Diagnoses:    1. Paroxysmal atrial fibrillation (HCC)   2. Essential hypertension, benign   3. Chronic diastolic CHF (congestive heart failure) ([place])   4. Pacemaker   5. Aortic valve stenosis, etiology of cardiac valve disease unspecified   6. Chronic anticoagulation    Assessment and Plan:     PAF on chronic anticoagulation: AF burden < 1% on remote device check 01/12/22. No a fib on ekg today. No concerns for worsening a fib burden, no episodes of fast HR. Clarified and typed out instructions for taking additional flecainide 50 mg if HR greater than 120 bpm. No bleeding concerns. Continue Eliquis, flecainide, Toprol.   Chronic HFpEF: Hyperdynamic LV function, indeterminate diastolic parameters due to severe MAC, normal RV on echo 09/19/2021. She denies dyspnea, orthopnea, or PND. Chronic bilateral LE edema, wears compression stockings daily. Appears euvolemic on exam today. Advised to monitor closely for rate control as noted above to reduce risk of volume overload.   Aortic stenosis: Moderate calcification of aortic valve, mild to moderate aortic valve stenosis with mean gradient 25.5 mmHg. Asymptomatic. Will repeat echo on annual basis, due April 2024. She would like to schedule this today. Advised her of symptoms to monitor for and to call with concerns prior to next appointment.   Pacemaker: Normal device function on remote device check 01/12/2022. Management per EP team.   Hypertension: BP initially elevated but improved on my recheck.  She does not monitor on a consistent basis. Have encouraged her to get a cuff for home use. Continue Toprol.    Disposition: Keep your November appointment with Dr. Johney Frame unless there are no concerns then follow-up in 6 months  Medication  Adjustments/Labs and Tests Ordered: Current medicines are reviewed at length with the patient today.  Concerns regarding medicines are outlined above.  Orders Placed This Encounter  Procedures   EKG 12-Lead   ECHOCARDIOGRAM COMPLETE   No orders of the defined types were placed in this encounter.   Patient Instructions  Medication Instructions:  Your physician recommends that you continue on your current medications as directed. Please refer to the Current Medication list given to you today.  **If your heart rate (pulse) is higher than 120 beats per minute for more than 30 minutes, take an additional Flecainide 50 mg  If heart rate remains above 120 beats per minute for 1-2 hours after taking the medication, call our office.   *If you need a refill on your cardiac medications before your next appointment, please call your pharmacy*   Lab Work: None ordered  If you have labs (blood work) drawn today and your tests are completely normal, you will receive your results only by: [place] (if you have MyChart) OR A paper copy in the mail If you have any lab test that is abnormal or we need to change your treatment, we will call you to review the results.   Testing/Procedures: Your physician has requested that you have an echocardiogram April 2024. Echocardiography is a painless test that uses sound waves to create images of your heart. It provides your doctor with information about the size and shape of your heart and how well your heart's chambers and valves are working. This procedure takes approximately one hour. There are no restrictions for this procedure.   Follow-Up: At [facility], you and your health needs are our priority.  As part of our continuing mission to provide you with exceptional heart care, we have created  designated Provider Care Teams.  These Care Teams include your primary Cardiologist (physician) and Advanced Practice Providers (APPs -  Physician  Assistants and Nurse Practitioners) who all work together to provide you with the care you need, when you need it.  We recommend signing up for the patient portal called "MyChart".  Sign up information is provided on this After Visit Summary.  MyChart is used to connect with patients for Virtual Visits (Telemedicine).  Patients are able to view lab/test results, encounter notes, upcoming appointments, etc.  Non-urgent messages can be sent to your provider as well.   To learn more about what you can do with MyChart, go to [email or web address].    Your next appointment:   As scheduled in November  The format for your next appointment:   In Person  Provider:   Freada Bergeron, MD     Other Instructions   Important Information About Sugar      .h   Signed, Emmaline Life, NP  01/30/2022 3:10 PM    [place]

## 2022-01-30 ENCOUNTER — Ambulatory Visit (INDEPENDENT_AMBULATORY_CARE_PROVIDER_SITE_OTHER): Payer: Medicare Other | Admitting: Nurse Practitioner

## 2022-01-30 ENCOUNTER — Encounter: Payer: Self-pay | Admitting: Nurse Practitioner

## 2022-01-30 VITALS — BP 130/62 | HR 60 | Ht 63.0 in | Wt 195.4 lb

## 2022-01-30 DIAGNOSIS — I480 Paroxysmal atrial fibrillation: Secondary | ICD-10-CM

## 2022-01-30 DIAGNOSIS — I5032 Chronic diastolic (congestive) heart failure: Secondary | ICD-10-CM

## 2022-01-30 DIAGNOSIS — I10 Essential (primary) hypertension: Secondary | ICD-10-CM

## 2022-01-30 DIAGNOSIS — I350 Nonrheumatic aortic (valve) stenosis: Secondary | ICD-10-CM

## 2022-01-30 DIAGNOSIS — Z950 Presence of cardiac pacemaker: Secondary | ICD-10-CM

## 2022-01-30 DIAGNOSIS — Z7901 Long term (current) use of anticoagulants: Secondary | ICD-10-CM

## 2022-01-30 NOTE — Patient Instructions (Addendum)
Medication Instructions:  Your physician recommends that you continue on your current medications as directed. Please refer to the Current Medication list given to you today.  **If your heart rate (pulse) is higher than 120 beats per minute for more than 30 minutes, take an additional Flecainide 50 mg  If heart rate remains above 120 beats per minute for 1-2 hours after taking the medication, call our office.   *If you need a refill on your cardiac medications before your next appointment, please call your pharmacy*   Lab Work: None ordered  If you have labs (blood work) drawn today and your tests are completely normal, you will receive your results only by: [place] (if you have MyChart) OR A paper copy in the mail If you have any lab test that is abnormal or we need to change your treatment, we will call you to review the results.   Testing/Procedures: Your physician has requested that you have an echocardiogram April 2024. Echocardiography is a painless test that uses sound waves to create images of your heart. It provides your doctor with information about the size and shape of your heart and how well your heart's chambers and valves are working. This procedure takes approximately one hour. There are no restrictions for this procedure.   Follow-Up: At [facility], you and your health needs are our priority.  As part of our continuing mission to provide you with exceptional heart care, we have created designated Provider Care Teams.  These Care Teams include your primary Cardiologist (physician) and Advanced Practice Providers (APPs -  Physician Assistants and Nurse Practitioners) who all work together to provide you with the care you need, when you need it.  We recommend signing up for the patient portal called "MyChart".  Sign up information is provided on this After Visit Summary.  MyChart is used to connect with patients for Virtual Visits (Telemedicine).  Patients are able  to view lab/test results, encounter notes, upcoming appointments, etc.  Non-urgent messages can be sent to your provider as well.   To learn more about what you can do with MyChart, go to [email or web address].    Your next appointment:   As scheduled in November  The format for your next appointment:   In Person  Provider:   Freada Bergeron, MD     Other Instructions   Important Information About Sugar      .h

## 2022-02-01 NOTE — Progress Notes (Signed)
Remote pacemaker transmission.   

## 2022-02-11 ENCOUNTER — Inpatient Hospital Stay (HOSPITAL_COMMUNITY)
Admission: EM | Admit: 2022-02-11 | Discharge: 2022-02-16 | DRG: 871 | Disposition: A | Discharge status: Skilled Nursing Facility | Payer: Medicare Other | Attending: Internal Medicine | Admitting: Internal Medicine

## 2022-02-11 ENCOUNTER — Emergency Department (HOSPITAL_COMMUNITY): Payer: Medicare Other

## 2022-02-11 ENCOUNTER — Other Ambulatory Visit: Payer: Self-pay

## 2022-02-11 ENCOUNTER — Encounter (HOSPITAL_COMMUNITY): Payer: Self-pay

## 2022-02-11 DIAGNOSIS — I350 Nonrheumatic aortic (valve) stenosis: Secondary | ICD-10-CM | POA: Diagnosis present

## 2022-02-11 DIAGNOSIS — I959 Hypotension, unspecified: Secondary | ICD-10-CM | POA: Diagnosis not present

## 2022-02-11 DIAGNOSIS — D6859 Other primary thrombophilia: Secondary | ICD-10-CM | POA: Diagnosis present

## 2022-02-11 DIAGNOSIS — N3289 Other specified disorders of bladder: Secondary | ICD-10-CM | POA: Diagnosis not present

## 2022-02-11 DIAGNOSIS — E872 Acidosis, unspecified: Secondary | ICD-10-CM | POA: Diagnosis present

## 2022-02-11 DIAGNOSIS — L03115 Cellulitis of right lower limb: Secondary | ICD-10-CM | POA: Diagnosis present

## 2022-02-11 DIAGNOSIS — Z90710 Acquired absence of both cervix and uterus: Secondary | ICD-10-CM | POA: Diagnosis not present

## 2022-02-11 DIAGNOSIS — I480 Paroxysmal atrial fibrillation: Secondary | ICD-10-CM | POA: Diagnosis not present

## 2022-02-11 DIAGNOSIS — E876 Hypokalemia: Secondary | ICD-10-CM | POA: Diagnosis present

## 2022-02-11 DIAGNOSIS — Z79890 Hormone replacement therapy (postmenopausal): Secondary | ICD-10-CM

## 2022-02-11 DIAGNOSIS — Z886 Allergy status to analgesic agent status: Secondary | ICD-10-CM

## 2022-02-11 DIAGNOSIS — G9341 Metabolic encephalopathy: Secondary | ICD-10-CM | POA: Diagnosis present

## 2022-02-11 DIAGNOSIS — D696 Thrombocytopenia, unspecified: Secondary | ICD-10-CM | POA: Diagnosis present

## 2022-02-11 DIAGNOSIS — I495 Sick sinus syndrome: Secondary | ICD-10-CM | POA: Diagnosis present

## 2022-02-11 DIAGNOSIS — L03116 Cellulitis of left lower limb: Secondary | ICD-10-CM

## 2022-02-11 DIAGNOSIS — Z7189 Other specified counseling: Secondary | ICD-10-CM

## 2022-02-11 DIAGNOSIS — Z7901 Long term (current) use of anticoagulants: Secondary | ICD-10-CM

## 2022-02-11 DIAGNOSIS — I110 Hypertensive heart disease with heart failure: Secondary | ICD-10-CM | POA: Diagnosis present

## 2022-02-11 DIAGNOSIS — E039 Hypothyroidism, unspecified: Secondary | ICD-10-CM | POA: Diagnosis present

## 2022-02-11 DIAGNOSIS — R053 Chronic cough: Secondary | ICD-10-CM | POA: Diagnosis present

## 2022-02-11 DIAGNOSIS — I10 Essential (primary) hypertension: Secondary | ICD-10-CM | POA: Diagnosis not present

## 2022-02-11 DIAGNOSIS — A419 Sepsis, unspecified organism: Principal | ICD-10-CM | POA: Diagnosis present

## 2022-02-11 DIAGNOSIS — I5032 Chronic diastolic (congestive) heart failure: Secondary | ICD-10-CM | POA: Diagnosis present

## 2022-02-11 DIAGNOSIS — R4182 Altered mental status, unspecified: Secondary | ICD-10-CM | POA: Diagnosis not present

## 2022-02-11 DIAGNOSIS — M6281 Muscle weakness (generalized): Secondary | ICD-10-CM | POA: Diagnosis not present

## 2022-02-11 DIAGNOSIS — Z66 Do not resuscitate: Secondary | ICD-10-CM | POA: Diagnosis not present

## 2022-02-11 DIAGNOSIS — R509 Fever, unspecified: Secondary | ICD-10-CM | POA: Diagnosis not present

## 2022-02-11 DIAGNOSIS — N839 Noninflammatory disorder of ovary, fallopian tube and broad ligament, unspecified: Secondary | ICD-10-CM | POA: Diagnosis present

## 2022-02-11 DIAGNOSIS — E781 Pure hyperglyceridemia: Secondary | ICD-10-CM | POA: Diagnosis present

## 2022-02-11 DIAGNOSIS — Z881 Allergy status to other antibiotic agents status: Secondary | ICD-10-CM

## 2022-02-11 DIAGNOSIS — R6520 Severe sepsis without septic shock: Secondary | ICD-10-CM | POA: Diagnosis present

## 2022-02-11 DIAGNOSIS — Z87891 Personal history of nicotine dependence: Secondary | ICD-10-CM

## 2022-02-11 DIAGNOSIS — I4819 Other persistent atrial fibrillation: Secondary | ICD-10-CM | POA: Diagnosis present

## 2022-02-11 DIAGNOSIS — Z9181 History of falling: Secondary | ICD-10-CM

## 2022-02-11 DIAGNOSIS — Z20822 Contact with and (suspected) exposure to covid-19: Secondary | ICD-10-CM | POA: Diagnosis present

## 2022-02-11 DIAGNOSIS — N390 Urinary tract infection, site not specified: Secondary | ICD-10-CM | POA: Diagnosis not present

## 2022-02-11 DIAGNOSIS — N179 Acute kidney failure, unspecified: Secondary | ICD-10-CM | POA: Diagnosis present

## 2022-02-11 DIAGNOSIS — D72829 Elevated white blood cell count, unspecified: Secondary | ICD-10-CM | POA: Diagnosis not present

## 2022-02-11 DIAGNOSIS — R2689 Other abnormalities of gait and mobility: Secondary | ICD-10-CM | POA: Diagnosis not present

## 2022-02-11 DIAGNOSIS — Z950 Presence of cardiac pacemaker: Secondary | ICD-10-CM

## 2022-02-11 DIAGNOSIS — K219 Gastro-esophageal reflux disease without esophagitis: Secondary | ICD-10-CM | POA: Diagnosis present

## 2022-02-11 DIAGNOSIS — Z885 Allergy status to narcotic agent status: Secondary | ICD-10-CM

## 2022-02-11 DIAGNOSIS — R0902 Hypoxemia: Secondary | ICD-10-CM | POA: Diagnosis not present

## 2022-02-11 DIAGNOSIS — D649 Anemia, unspecified: Secondary | ICD-10-CM | POA: Diagnosis present

## 2022-02-11 DIAGNOSIS — Z888 Allergy status to other drugs, medicaments and biological substances status: Secondary | ICD-10-CM

## 2022-02-11 DIAGNOSIS — R1312 Dysphagia, oropharyngeal phase: Secondary | ICD-10-CM | POA: Diagnosis not present

## 2022-02-11 DIAGNOSIS — Z515 Encounter for palliative care: Secondary | ICD-10-CM | POA: Diagnosis not present

## 2022-02-11 DIAGNOSIS — R059 Cough, unspecified: Secondary | ICD-10-CM | POA: Diagnosis not present

## 2022-02-11 DIAGNOSIS — N83202 Unspecified ovarian cyst, left side: Secondary | ICD-10-CM | POA: Diagnosis not present

## 2022-02-11 DIAGNOSIS — Z79899 Other long term (current) drug therapy: Secondary | ICD-10-CM

## 2022-02-11 DIAGNOSIS — I6789 Other cerebrovascular disease: Secondary | ICD-10-CM | POA: Diagnosis not present

## 2022-02-11 DIAGNOSIS — Z8249 Family history of ischemic heart disease and other diseases of the circulatory system: Secondary | ICD-10-CM

## 2022-02-11 DIAGNOSIS — E8841 MELAS syndrome: Secondary | ICD-10-CM | POA: Diagnosis not present

## 2022-02-11 DIAGNOSIS — Z880 Allergy status to penicillin: Secondary | ICD-10-CM

## 2022-02-11 DIAGNOSIS — R531 Weakness: Secondary | ICD-10-CM | POA: Diagnosis not present

## 2022-02-11 LAB — CBC WITH DIFFERENTIAL/PLATELET
Abs Immature Granulocytes: 0.3 10*3/uL — ABNORMAL HIGH (ref 0.00–0.07)
Basophils Absolute: 0.1 10*3/uL (ref 0.0–0.1)
Basophils Relative: 0 %
Eosinophils Absolute: 0 10*3/uL (ref 0.0–0.5)
Eosinophils Relative: 0 %
HCT: 41.4 % (ref 36.0–46.0)
Hemoglobin: 13.3 g/dL (ref 12.0–15.0)
Immature Granulocytes: 2 %
Lymphocytes Relative: 4 %
Lymphs Abs: 0.7 10*3/uL (ref 0.7–4.0)
MCH: 32 pg (ref 26.0–34.0)
MCHC: 32.1 g/dL (ref 30.0–36.0)
MCV: 99.8 fL (ref 80.0–100.0)
Monocytes Absolute: 1.1 10*3/uL — ABNORMAL HIGH (ref 0.1–1.0)
Monocytes Relative: 7 %
Neutro Abs: 14.7 10*3/uL — ABNORMAL HIGH (ref 1.7–7.7)
Neutrophils Relative %: 87 %
Platelets: 113 10*3/uL — ABNORMAL LOW (ref 150–400)
RBC: 4.15 MIL/uL (ref 3.87–5.11)
RDW: 13.2 % (ref 11.5–15.5)
WBC: 16.9 10*3/uL — ABNORMAL HIGH (ref 4.0–10.5)
nRBC: 0 % (ref 0.0–0.2)

## 2022-02-11 NOTE — ED Triage Notes (Signed)
Pt bib [facility] from [place].  EMS report pt being "sick"  and a non-productive cough x 1 week. When EMS arrived, GCS=13, pt did not know why she was going to hospital.  Pt c/o being weak.  Cbg=154 89% RA, 96% 4LNC RR=22 126/36  20g RAC, 576m NS

## 2022-02-12 ENCOUNTER — Inpatient Hospital Stay (HOSPITAL_COMMUNITY): Payer: Medicare Other

## 2022-02-12 ENCOUNTER — Emergency Department (HOSPITAL_COMMUNITY): Payer: Medicare Other

## 2022-02-12 ENCOUNTER — Encounter (HOSPITAL_COMMUNITY): Payer: Self-pay | Admitting: Internal Medicine

## 2022-02-12 DIAGNOSIS — D649 Anemia, unspecified: Secondary | ICD-10-CM | POA: Diagnosis present

## 2022-02-12 DIAGNOSIS — A419 Sepsis, unspecified organism: Secondary | ICD-10-CM | POA: Diagnosis present

## 2022-02-12 DIAGNOSIS — I6789 Other cerebrovascular disease: Secondary | ICD-10-CM | POA: Diagnosis not present

## 2022-02-12 DIAGNOSIS — E039 Hypothyroidism, unspecified: Secondary | ICD-10-CM | POA: Diagnosis present

## 2022-02-12 DIAGNOSIS — Z515 Encounter for palliative care: Secondary | ICD-10-CM | POA: Diagnosis not present

## 2022-02-12 DIAGNOSIS — N3289 Other specified disorders of bladder: Secondary | ICD-10-CM | POA: Diagnosis not present

## 2022-02-12 DIAGNOSIS — R4182 Altered mental status, unspecified: Secondary | ICD-10-CM | POA: Diagnosis not present

## 2022-02-12 DIAGNOSIS — D696 Thrombocytopenia, unspecified: Secondary | ICD-10-CM | POA: Diagnosis present

## 2022-02-12 DIAGNOSIS — I480 Paroxysmal atrial fibrillation: Secondary | ICD-10-CM | POA: Diagnosis not present

## 2022-02-12 DIAGNOSIS — Z7189 Other specified counseling: Secondary | ICD-10-CM | POA: Diagnosis not present

## 2022-02-12 DIAGNOSIS — I4819 Other persistent atrial fibrillation: Secondary | ICD-10-CM | POA: Diagnosis present

## 2022-02-12 DIAGNOSIS — I5032 Chronic diastolic (congestive) heart failure: Secondary | ICD-10-CM | POA: Diagnosis present

## 2022-02-12 DIAGNOSIS — K219 Gastro-esophageal reflux disease without esophagitis: Secondary | ICD-10-CM | POA: Diagnosis present

## 2022-02-12 DIAGNOSIS — G9341 Metabolic encephalopathy: Secondary | ICD-10-CM | POA: Diagnosis present

## 2022-02-12 DIAGNOSIS — R1312 Dysphagia, oropharyngeal phase: Secondary | ICD-10-CM | POA: Diagnosis not present

## 2022-02-12 DIAGNOSIS — Z90710 Acquired absence of both cervix and uterus: Secondary | ICD-10-CM | POA: Diagnosis not present

## 2022-02-12 DIAGNOSIS — N839 Noninflammatory disorder of ovary, fallopian tube and broad ligament, unspecified: Secondary | ICD-10-CM | POA: Diagnosis present

## 2022-02-12 DIAGNOSIS — D72829 Elevated white blood cell count, unspecified: Secondary | ICD-10-CM | POA: Diagnosis not present

## 2022-02-12 DIAGNOSIS — N390 Urinary tract infection, site not specified: Secondary | ICD-10-CM | POA: Diagnosis present

## 2022-02-12 DIAGNOSIS — L03116 Cellulitis of left lower limb: Secondary | ICD-10-CM | POA: Diagnosis present

## 2022-02-12 DIAGNOSIS — R531 Weakness: Secondary | ICD-10-CM | POA: Diagnosis not present

## 2022-02-12 DIAGNOSIS — D6859 Other primary thrombophilia: Secondary | ICD-10-CM | POA: Diagnosis present

## 2022-02-12 DIAGNOSIS — I110 Hypertensive heart disease with heart failure: Secondary | ICD-10-CM | POA: Diagnosis present

## 2022-02-12 DIAGNOSIS — I10 Essential (primary) hypertension: Secondary | ICD-10-CM | POA: Diagnosis not present

## 2022-02-12 DIAGNOSIS — L03115 Cellulitis of right lower limb: Secondary | ICD-10-CM | POA: Diagnosis present

## 2022-02-12 DIAGNOSIS — R6520 Severe sepsis without septic shock: Secondary | ICD-10-CM | POA: Diagnosis present

## 2022-02-12 DIAGNOSIS — E876 Hypokalemia: Secondary | ICD-10-CM | POA: Diagnosis present

## 2022-02-12 DIAGNOSIS — R2689 Other abnormalities of gait and mobility: Secondary | ICD-10-CM | POA: Diagnosis not present

## 2022-02-12 DIAGNOSIS — I495 Sick sinus syndrome: Secondary | ICD-10-CM | POA: Diagnosis present

## 2022-02-12 DIAGNOSIS — I350 Nonrheumatic aortic (valve) stenosis: Secondary | ICD-10-CM | POA: Diagnosis present

## 2022-02-12 DIAGNOSIS — Z66 Do not resuscitate: Secondary | ICD-10-CM | POA: Diagnosis not present

## 2022-02-12 DIAGNOSIS — E872 Acidosis, unspecified: Secondary | ICD-10-CM | POA: Diagnosis present

## 2022-02-12 DIAGNOSIS — R053 Chronic cough: Secondary | ICD-10-CM | POA: Diagnosis present

## 2022-02-12 DIAGNOSIS — R059 Cough, unspecified: Secondary | ICD-10-CM | POA: Diagnosis not present

## 2022-02-12 DIAGNOSIS — E781 Pure hyperglyceridemia: Secondary | ICD-10-CM | POA: Diagnosis present

## 2022-02-12 DIAGNOSIS — E8841 MELAS syndrome: Secondary | ICD-10-CM | POA: Diagnosis not present

## 2022-02-12 DIAGNOSIS — Z20822 Contact with and (suspected) exposure to covid-19: Secondary | ICD-10-CM | POA: Diagnosis present

## 2022-02-12 DIAGNOSIS — M6281 Muscle weakness (generalized): Secondary | ICD-10-CM | POA: Diagnosis not present

## 2022-02-12 DIAGNOSIS — N179 Acute kidney failure, unspecified: Secondary | ICD-10-CM | POA: Diagnosis present

## 2022-02-12 DIAGNOSIS — N83202 Unspecified ovarian cyst, left side: Secondary | ICD-10-CM | POA: Diagnosis not present

## 2022-02-12 LAB — LACTIC ACID, PLASMA
Lactic Acid, Venous: 3.4 mmol/L (ref 0.5–1.9)
Lactic Acid, Venous: 3.2 mmol/L (ref 0.5–1.9)
Lactic Acid, Venous: 1.6 mmol/L (ref 0.5–1.9)

## 2022-02-12 LAB — URINALYSIS, ROUTINE W REFLEX MICROSCOPIC
Bilirubin Urine: NEGATIVE
Glucose, UA: NEGATIVE mg/dL
Hgb urine dipstick: NEGATIVE
Ketones, ur: NEGATIVE mg/dL
Nitrite: NEGATIVE
Protein, ur: 100 mg/dL — AB
Specific Gravity, Urine: 1.016 (ref 1.005–1.030)
WBC, UA: >50 WBC/hpf — ABNORMAL HIGH (ref 0–5)
pH: 5 (ref 5.0–8.0)

## 2022-02-12 LAB — BASIC METABOLIC PANEL
Anion gap: 8 (ref 5–15)
Anion gap: 8 (ref 5–15)
BUN: 32 mg/dL — ABNORMAL HIGH (ref 8–23)
BUN: 32 mg/dL — ABNORMAL HIGH (ref 8–23)
CO2: 23 mmol/L (ref 22–32)
CO2: 22 mmol/L (ref 22–32)
Calcium: 8.1 mg/dL — ABNORMAL LOW (ref 8.9–10.3)
Calcium: 8.6 mg/dL — ABNORMAL LOW (ref 8.9–10.3)
Chloride: 106 mmol/L (ref 98–111)
Chloride: 108 mmol/L (ref 98–111)
Creatinine, Ser: 1.17 mg/dL — ABNORMAL HIGH (ref 0.44–1.00)
Creatinine, Ser: 1.09 mg/dL — ABNORMAL HIGH (ref 0.44–1.00)
GFR, Estimated: 44 mL/min — ABNORMAL LOW (ref >60)
GFR, Estimated: 48 mL/min — ABNORMAL LOW (ref >60)
Glucose, Bld: 116 mg/dL — ABNORMAL HIGH (ref 70–99)
Glucose, Bld: 119 mg/dL — ABNORMAL HIGH (ref 70–99)
Potassium: 3.5 mmol/L (ref 3.5–5.1)
Potassium: 3.4 mmol/L — ABNORMAL LOW (ref 3.5–5.1)
Sodium: 137 mmol/L (ref 135–145)
Sodium: 138 mmol/L (ref 135–145)

## 2022-02-12 LAB — COMPREHENSIVE METABOLIC PANEL
ALT: 17 U/L (ref 0–44)
AST: 35 U/L (ref 15–41)
Albumin: 3.5 g/dL (ref 3.5–5.0)
Alkaline Phosphatase: 44 U/L (ref 38–126)
Anion gap: 11 (ref 5–15)
BUN: 29 mg/dL — ABNORMAL HIGH (ref 8–23)
CO2: 23 mmol/L (ref 22–32)
Calcium: 8.9 mg/dL (ref 8.9–10.3)
Chloride: 105 mmol/L (ref 98–111)
Creatinine, Ser: 1.25 mg/dL — ABNORMAL HIGH (ref 0.44–1.00)
GFR, Estimated: 40 mL/min — ABNORMAL LOW (ref >60)
Glucose, Bld: 127 mg/dL — ABNORMAL HIGH (ref 70–99)
Potassium: 3.8 mmol/L (ref 3.5–5.1)
Sodium: 139 mmol/L (ref 135–145)
Total Bilirubin: 1.1 mg/dL (ref 0.3–1.2)
Total Protein: 6.7 g/dL (ref 6.5–8.1)

## 2022-02-12 LAB — MRSA NEXT GEN BY PCR, NASAL: MRSA by PCR Next Gen: DETECTED — AB

## 2022-02-12 LAB — RESP PANEL BY RT-PCR (FLU A&B, COVID) ARPGX2
Influenza A by PCR: NEGATIVE
Influenza B by PCR: NEGATIVE
SARS Coronavirus 2 by RT PCR: NEGATIVE

## 2022-02-12 LAB — CBC
HCT: 35.7 % — ABNORMAL LOW (ref 36.0–46.0)
Hemoglobin: 11.6 g/dL — ABNORMAL LOW (ref 12.0–15.0)
MCH: 32.3 pg (ref 26.0–34.0)
MCHC: 32.5 g/dL (ref 30.0–36.0)
MCV: 99.4 fL (ref 80.0–100.0)
Platelets: 90 10*3/uL — ABNORMAL LOW (ref 150–400)
RBC: 3.59 MIL/uL — ABNORMAL LOW (ref 3.87–5.11)
RDW: 13.2 % (ref 11.5–15.5)
WBC: 13.2 10*3/uL — ABNORMAL HIGH (ref 4.0–10.5)
nRBC: 0 % (ref 0.0–0.2)

## 2022-02-12 LAB — PROTIME-INR
INR: 1.3 — ABNORMAL HIGH (ref 0.8–1.2)
Prothrombin Time: 16.2 seconds — ABNORMAL HIGH (ref 11.4–15.2)

## 2022-02-12 MED ORDER — LACTATED RINGERS IV BOLUS
500.0000 mL | Freq: Once | INTRAVENOUS | Status: AC
  Administered 2022-02-12: 500 mL via INTRAVENOUS

## 2022-02-12 MED ORDER — VANCOMYCIN HCL IN DEXTROSE 1-5 GM/200ML-% IV SOLN: 1000.0000 mg | Freq: Once | INTRAVENOUS | Status: DC

## 2022-02-12 MED ORDER — LEVOFLOXACIN IN D5W 750 MG/150ML IV SOLN
750.0000 mg | Freq: Once | INTRAVENOUS | Status: AC
  Administered 2022-02-12: 750 mg via INTRAVENOUS
  Filled 2022-02-12: qty 150

## 2022-02-12 MED ORDER — MUPIROCIN 2 % EX OINT
1.0000 | TOPICAL_OINTMENT | Freq: Two times a day (BID) | CUTANEOUS | Status: DC
  Administered 2022-02-12 – 2022-02-16 (×8): 1 via NASAL
  Filled 2022-02-12: qty 22

## 2022-02-12 MED ORDER — VANCOMYCIN HCL 1750 MG/350ML IV SOLN
1750.0000 mg | Freq: Once | INTRAVENOUS | Status: AC
  Administered 2022-02-12: 1750 mg via INTRAVENOUS
  Filled 2022-02-12: qty 350

## 2022-02-12 MED ORDER — LEVOFLOXACIN IN D5W 750 MG/150ML IV SOLN: 750.0000 mg | INTRAVENOUS | Status: DC

## 2022-02-12 MED ORDER — LACTATED RINGERS IV SOLN: INTRAVENOUS | Status: DC

## 2022-02-12 MED ORDER — METOPROLOL SUCCINATE ER 50 MG PO TB24
75.0000 mg | ORAL_TABLET | Freq: Every day | ORAL | Status: DC
  Administered 2022-02-12 – 2022-02-16 (×5): 75 mg via ORAL
  Filled 2022-02-12 (×5): qty 1

## 2022-02-12 MED ORDER — ACETAMINOPHEN 325 MG PO TABS
650.0000 mg | ORAL_TABLET | Freq: Four times a day (QID) | ORAL | Status: DC | PRN
  Administered 2022-02-12 – 2022-02-16 (×7): 650 mg via ORAL
  Filled 2022-02-12 (×7): qty 2

## 2022-02-12 MED ORDER — APIXABAN 5 MG PO TABS
5.0000 mg | ORAL_TABLET | Freq: Two times a day (BID) | ORAL | Status: DC
  Administered 2022-02-12 – 2022-02-16 (×9): 5 mg via ORAL
  Filled 2022-02-12 (×9): qty 1

## 2022-02-12 MED ORDER — LEVOTHYROXINE SODIUM 100 MCG PO TABS
100.0000 ug | ORAL_TABLET | Freq: Every morning | ORAL | Status: DC
  Administered 2022-02-12 – 2022-02-16 (×5): 100 ug via ORAL
  Filled 2022-02-12 (×5): qty 1

## 2022-02-12 MED ORDER — VANCOMYCIN HCL IN DEXTROSE 1-5 GM/200ML-% IV SOLN: 1000.0000 mg | INTRAVENOUS | Status: DC

## 2022-02-12 MED ORDER — ACETAMINOPHEN 650 MG RE SUPP: 650.0000 mg | Freq: Four times a day (QID) | RECTAL | Status: DC | PRN

## 2022-02-12 MED ORDER — FLECAINIDE ACETATE 50 MG PO TABS
50.0000 mg | ORAL_TABLET | Freq: Two times a day (BID) | ORAL | Status: DC
  Administered 2022-02-12 – 2022-02-16 (×9): 50 mg via ORAL
  Filled 2022-02-12 (×9): qty 1

## 2022-02-12 MED ORDER — VANCOMYCIN HCL IN DEXTROSE 1-5 GM/200ML-% IV SOLN
1000.0000 mg | INTRAVENOUS | Status: DC
Start: 2022-02-14 — End: 2022-02-13

## 2022-02-12 MED ORDER — SODIUM CHLORIDE 0.9 % IV BOLUS
500.0000 mL | Freq: Once | INTRAVENOUS | Status: AC
  Administered 2022-02-12: 500 mL via INTRAVENOUS

## 2022-02-12 MED ORDER — SODIUM CHLORIDE 0.9 % IV SOLN: INTRAVENOUS | Status: DC

## 2022-02-12 MED ORDER — CHLORHEXIDINE GLUCONATE CLOTH 2 % EX PADS
6.0000 | MEDICATED_PAD | Freq: Every day | CUTANEOUS | Status: DC
  Administered 2022-02-13 – 2022-02-15 (×3): 6 via TOPICAL

## 2022-02-12 MED ORDER — DIPHENHYDRAMINE HCL 25 MG PO CAPS: 25.0000 mg | ORAL_CAPSULE | ORAL | Status: DC

## 2022-02-12 NOTE — Sepsis Progress Note (Signed)
[place] following for Sepsis Protocol 

## 2022-02-12 NOTE — H&P (Signed)
History and Physical    [name] ZOX:[id number] DOB: [birth year] DOA: 02/11/2022  PCP: Aretta Nip, MD  Patient coming from: [facility].  Chief Complaint: Not feeling well.  HPI: [name] is a 88 y.o. female with history of atrial fibrillation, moderate aortic stenosis, chronic diastolic dysfunction, hypertension, symptomatic bradycardia status post pacemaker placement was brought to the ER after patient was not feeling well had nausea vomiting has been exam cough for last few days.  Patient also was found to be confused.  Patient's daughter was at the bedside stated that she was doing well the day before when she met her.  ED Course: In the ER patient is mildly febrile with labs showing leukocytosis elevated lactic acid which is concerning for sepsis.  Chest x-Samad shows infiltrates UA is concerning for UTI.  On exam patient does have warm left lower extremity.  Concerning for cellulitis.  Patient was started on empiric antibiotics admitted for further work-up.  Patient's mentation has improved.  CT head is unremarkable.  Review of Systems: As per HPI, rest all negative.   Past Medical History:  Diagnosis Date   Aortic stenosis 03/08/2016   Brady-tachy syndrome ([place])    a. Biotronik dual chamber PPM implanted 2009 b. gen change to STJ dual chamber PPM 2017   Ejection fraction    EF 70%, echo, October, 2009  //   EF 55-60%, septal dyssynergy consistent with a paced rhythm, mild to moderate mitral regurgitation, echo, November, 2015    GERD (gastroesophageal reflux disease) 04/28/2014   Episodes November, 2015 with fluid refluxing from her esophagus.    Hair loss    Patient questioned Coumadin, changed toPradaxa   Hypertension    Hypothyroidism    Lower extremity edema 01/22/2018   Mitral regurgitation 05/03/2014   Mild-to-moderate mitral regurgitation, echo, November, 2015    Osteoarthritis of left knee 10/25/2017   Paroxysmal atrial fibrillation ([facility])    Episodes rapid  atrial fib noted by pacemaker interrogation, August, 2011, diltiazem added, patient improved. Patient continues on Rythmol  //   Changed to flecainide 2013  //   flecainide level checked in 2013, good level    Persistent atrial fibrillation ([place])     Past Surgical History:  Procedure Laterality Date   ABDOMINAL HYSTERECTOMY     EP IMPLANTABLE DEVICE N/A 08/08/2015   Generator change with [facility] [employer] DR PPM by Dr Rayann Heman   JOINT REPLACEMENT     PACEMAKER PLACEMENT  2009         reports that she quit smoking about 31 years ago. Her smoking use included cigarettes. She has never used smokeless tobacco. She reports that she does not drink alcohol and does not use drugs.   Allergies -multiple allergies including penicillin.    Family History  Problem Relation Age of Onset   Heart disease Father    Coronary artery disease Other    Heart disease Son     Prior to Admission medications   Medication Sig Start Date End Date Taking? Authorizing Provider  apixaban (ELIQUIS) 5 MG TABS tablet Take 1 tablet (5 mg total) by mouth 2 (two) times daily. 12/11/21  Yes Allred, Jeneen Rinks, MD  Apoaequorin (PREVAGEN PO) Take 1 capsule by mouth daily.   Yes [provider]  Calcium Carbonate-Vit D-Min (CALCIUM 600+D PLUS MINERALS) 600-400 MG-UNIT TABS Take 1 tablet by mouth daily.   Yes [provider]  flecainide (TAMBOCOR) 50 MG tablet Take 1 tablet (50 mg total) by mouth  2 (two) times daily. 12/18/21  Yes Freada Bergeron, MD  furosemide (LASIX) 20 MG tablet Take 1 tablet (20 mg total) by mouth daily. 12/20/20  Yes Freada Bergeron, MD  levothyroxine (SYNTHROID) 100 MCG tablet Take 100 mcg by mouth every morning. 12/13/21  Yes [provider]  lidocaine (LIDODERM) 5 % Place 1 patch onto the skin daily as needed. Remove & Discard patch within 12 hours or as directed by MD Patient taking differently: Place 1 patch onto the skin daily as needed (for knee pain). Remove & Discard  patch within 12 hours or as directed by MD 01/22/22  Yes Jeanell Sparrow, DO  methocarbamol (ROBAXIN) 500 MG tablet Take 500 mg by mouth 2 (two) times daily.   Yes [provider]  metoprolol succinate (TOPROL-XL) 25 MG 24 hr tablet Take 3 tablets (75 mg total) by mouth daily. 12/18/21  Yes Freada Bergeron, MD  Multiple Vitamins-Minerals (OCUVITE PO) Take 1 tablet by mouth daily.   Yes [provider]  potassium chloride (KLOR-CON M) 10 MEQ tablet Take 1 tablet (10 mEq total) by mouth daily. 12/18/21  Yes Freada Bergeron, MD  acetaminophen (TYLENOL) 325 MG tablet Take 2 tablets (650 mg total) by mouth every 6 (six) hours as needed. 01/22/22   Jeanell Sparrow, DO  acetaminophen (TYLENOL) 500 MG tablet Take 1,000 mg by mouth daily.    [provider]    Physical Exam: Constitutional: Moderately built and nourished. Vitals:   02/12/22 0230 02/12/22 0300 02/12/22 0330 02/12/22 0341  BP: 109/84 (!) 140/47 (!) 134/43   Pulse: 71 67 71   Resp:  20    Temp:    99.4 F (37.4 C)  TempSrc:    Oral  SpO2: 98% 97% 97%   Weight:      Height:       Eyes: Anicteric no pallor. ENMT: No discharge from the ears eyes nose and mouth. Neck: No mass felt.  No neck rigidity. Respiratory: No rhonchi or crepitations. Cardiovascular: S1-S2 heard. Abdomen: Soft nontender bowel sound present. Musculoskeletal: Edema of the both lower extremity with warmth of the left lower extremity erythematous changes. Skin: Erythema of the both lower extremities.  More on the left side. Neurologic: Awake oriented to place and person moving all extremities. Psychiatric: Oriented to person and place.   Labs on Admission: I have personally reviewed following labs and imaging studies  CBC: Recent Labs  Lab 02/11/22 2351  WBC 16.9*  NEUTROABS 14.7*  HGB 13.3  HCT 41.4  MCV 99.8  PLT 454*   Basic Metabolic Panel: Recent Labs  Lab 02/11/22 2351  NA 139  K 3.8  CL 105  CO2 23   GLUCOSE 127*  BUN 29*  CREATININE 1.25*  CALCIUM 8.9   GFR: Estimated Creatinine Clearance: 30.3 mL/min (A) (by C-G formula based on SCr of 1.25 mg/dL (H)). Liver Function Tests: Recent Labs  Lab 02/11/22 2351  AST 35  ALT 17  ALKPHOS 44  BILITOT 1.1  PROT 6.7  ALBUMIN 3.5   No results for input(s): "LIPASE", "AMYLASE" in the last 168 hours. No results for input(s): "AMMONIA" in the last 168 hours. Coagulation Profile: Recent Labs  Lab 02/11/22 2351  INR 1.3*   Cardiac Enzymes: No results for input(s): "CKTOTAL", "CKMB", "CKMBINDEX", "TROPONINI" in the last 168 hours. BNP (last 3 results) No results for input(s): "PROBNP" in the last 8760 hours. HbA1C: No results for input(s): "HGBA1C" in the last 72 hours.  CBG: No results for input(s): "GLUCAP" in the last 168 hours. Lipid Profile: No results for input(s): "CHOL", "HDL", "LDLCALC", "TRIG", "CHOLHDL", "LDLDIRECT" in the last 72 hours. Thyroid Function Tests: No results for input(s): "TSH", "T4TOTAL", "FREET4", "T3FREE", "THYROIDAB" in the last 72 hours. Anemia Panel: No results for input(s): "VITAMINB12", "FOLATE", "FERRITIN", "TIBC", "IRON", "RETICCTPCT" in the last 72 hours. Urine analysis:    Component Value Date/Time   COLORURINE AMBER (A) 02/11/2022 2351   APPEARANCEUR CLOUDY (A) 02/11/2022 2351   LABSPEC 1.016 02/11/2022 2351   PHURINE 5.0 02/11/2022 2351   GLUCOSEU NEGATIVE 02/11/2022 2351   HGBUR NEGATIVE 02/11/2022 2351   BILIRUBINUR NEGATIVE 02/11/2022 2351   BILIRUBINUR neg 04/26/2012 1343   KETONESUR NEGATIVE 02/11/2022 2351   PROTEINUR 100 (A) 02/11/2022 2351   UROBILINOGEN 1.0 04/26/2012 1343   UROBILINOGEN 0.2 03/20/2011 1540   NITRITE NEGATIVE 02/11/2022 2351   LEUKOCYTESUR MODERATE (A) 02/11/2022 2351   Sepsis Labs: _0 (procalcitonin:4,lacticidven:4) ) Recent Results (from the past 240 hour(s))  Resp Panel by RT-PCR (Flu A&B, Covid) Anterior Nasal Swab     Status: None    Collection Time: 02/12/22  1:00 AM   Specimen: Anterior Nasal Swab  Result Value Ref Range Status   SARS Coronavirus 2 by RT PCR NEGATIVE NEGATIVE Final    Comment: (NOTE) SARS-CoV-2 target nucleic acids are NOT DETECTED.  The SARS-CoV-2 RNA is generally detectable in upper respiratory specimens during the acute phase of infection. The lowest concentration of SARS-CoV-2 viral copies this assay can detect is 138 copies/mL. A negative result does not preclude SARS-Cov-2 infection and should not be used as the sole basis for treatment or other patient management decisions. A negative result may occur with  improper specimen collection/handling, submission of specimen other than nasopharyngeal swab, presence of viral mutation(s) within the areas targeted by this assay, and inadequate number of viral copies(<138 copies/mL). A negative result must be combined with clinical observations, patient history, and epidemiological information. The expected result is Negative.  Fact Sheet for Patients:  EntrepreneurPulse.com.au  Fact Sheet for Healthcare Providers:  IncredibleEmployment.be  This test is no t yet approved or cleared by the [place] FDA and  has been authorized for detection and/or diagnosis of SARS-CoV-2 by FDA under an Emergency Use Authorization (EUA). This EUA will remain  in effect (meaning this test can be used) for the duration of the COVID-19 declaration under Section 564(b)(1) of the Act, 21 U.S.C.section 360bbb-3(b)(1), unless the authorization is terminated  or revoked sooner.       Influenza A by PCR NEGATIVE NEGATIVE Final   Influenza B by PCR NEGATIVE NEGATIVE Final    Comment: (NOTE) The Xpert Xpress SARS-CoV-2/FLU/RSV plus assay is intended as an aid in the diagnosis of influenza from Nasopharyngeal swab specimens and should not be used as a sole basis for treatment. Nasal washings and aspirates are unacceptable for  Xpert Xpress SARS-CoV-2/FLU/RSV testing.  Fact Sheet for Patients: EntrepreneurPulse.com.au  Fact Sheet for Healthcare Providers: IncredibleEmployment.be  This test is not yet approved or cleared by the [place] FDA and has been authorized for detection and/or diagnosis of SARS-CoV-2 by FDA under an Emergency Use Authorization (EUA). This EUA will remain in effect (meaning this test can be used) for the duration of the COVID-19 declaration under Section 564(b)(1) of the Act, 21 U.S.C. section 360bbb-3(b)(1), unless the authorization is terminated or revoked.  Performed at [facility], [place] [address] [place], [place] [address]      Radiological Exams  on Admission: CT Head Wo Contrast  Result Date: 02/12/2022 CLINICAL DATA:  Altered mental status. EXAM: CT HEAD WITHOUT CONTRAST TECHNIQUE: Contiguous axial images were obtained from the base of the skull through the vertex without intravenous contrast. RADIATION DOSE REDUCTION: This exam was performed according to the departmental dose-optimization program which includes automated exposure control, adjustment of the mA and/or kV according to patient size and/or use of iterative reconstruction technique. COMPARISON:  Head CT dated 01/22/2022. FINDINGS: Brain: Mild age-related atrophy and chronic microvascular ischemic changes. There is no acute intracranial hemorrhage. No mass effect or midline shift no extra-axial fluid collection. Vascular: No hyperdense vessel or unexpected calcification. Skull: Normal. Negative for fracture or focal lesion. Sinuses/Orbits: No acute finding. Other: None IMPRESSION: 1. No acute intracranial pathology. 2. Mild age-related atrophy and chronic microvascular ischemic changes. Electronically Signed   By: Anner Crete M.D.   On: 02/12/2022 01:15   DG Chest 2 View  Result Date: 02/12/2022 CLINICAL DATA:  Suspected sepsis. Nonproductive cough for 1  week, weakness. EXAM: CHEST - 2 VIEW COMPARISON:  01/05/2021, 12/20/2019. FINDINGS: Heart is enlarged the mediastinal contour is stable. Atherosclerotic calcification of the aorta is noted. Strandy opacities are present at the left lung base and there is blunting of the left costophrenic angle. Mild airspace disease is noted in the right upper lobe. No pneumothorax. Degenerative changes are noted in the thoracic spine. Stable compression deformities are present in the mid and lower thoracic spine. IMPRESSION: 1. Mild airspace disease in the right upper lobe and left lung base, possible developing pneumonia. 2. Blunting of the left costophrenic angle, possible small pleural effusion. Electronically Signed   By: Brett Fairy M.D.   On: 02/12/2022 00:30    EKG: Independently reviewed.  Normal sinus rhythm with ST-T changes in the anterolateral leads comparable to old leads.  Assessment/Plan Principal Problem:   Sepsis ([place]) Active Problems:   Essential hypertension, benign   Hypothyroidism   Paroxysmal atrial fibrillation ([facility])    Sepsis likely source could be left lower extremity cellulitis however chest x-Ziomek does show some infiltrates and also possible UTI.  On empiric antibiotics follow cultures continue hydration.  Follow lactate levels. History of A-fib on flecainide metoprolol and Eliquis. History of hypertension on metoprolol. Chronic disease stage III creatinine appears to be at baseline. Acute encephalopathy likely from sepsis improving. Hypothyroidism on Synthroid. History of moderate aortic stenosis and chronic diastolic dysfunction presently receiving fluids.  Closely monitor respiratory status. Left adnexal mass seen in the recent CAT scan.  We will get pelvic ultrasound.  Since patient has sepsis will need close monitoring and inpatient status.   DVT prophylaxis: Eliquis. Code Status: Full code. Family Communication: Patient's daughter. Disposition Plan: Back to facility when  stable. Consults called: None. Admission status: Inpatient.   Rise Patience MD [facility] Pager [phone].  If 7PM-7AM, please contact night-coverage [email or web address] Password [email or web address]  02/12/2022, 4:08 AM

## 2022-02-12 NOTE — ED Provider Notes (Signed)
[facility] DEPT Provider Note   CSN: [id number] Arrival date & time: 02/11/22  2332     History  Chief Complaint  Patient presents with   Weakness    [name] is a 88 y.o. female.  The history is provided by the patient, a relative and medical records.  Weakness [name] is a 88 y.o. female who presents to the Emergency Department complaining of weakness. She presents to the ED by EMS from [place] for evaluation of weakness and malaise that started yesterday.  Yesterday she felt unwell and had diarrhea.  This morning she was feeling better, but later today felt worse with nausea, chills and confusion.  Has a hx/o afib on anticoagulation, hypothyroidism.  Sxs are moderate, constant.      Home Medications Prior to Admission medications   Medication Sig Start Date End Date Taking? Authorizing Provider  apixaban (ELIQUIS) 5 MG TABS tablet Take 1 tablet (5 mg total) by mouth 2 (two) times daily. 12/11/21  Yes Allred, Jeneen Rinks, MD  Apoaequorin (PREVAGEN PO) Take 1 capsule by mouth daily.   Yes [provider]  Calcium Carbonate-Vit D-Min (CALCIUM 600+D PLUS MINERALS) 600-400 MG-UNIT TABS Take 1 tablet by mouth daily.   Yes [provider]  flecainide (TAMBOCOR) 50 MG tablet Take 1 tablet (50 mg total) by mouth 2 (two) times daily. 12/18/21  Yes Freada Bergeron, MD  furosemide (LASIX) 20 MG tablet Take 1 tablet (20 mg total) by mouth daily. 12/20/20  Yes Freada Bergeron, MD  levothyroxine (SYNTHROID) 100 MCG tablet Take 100 mcg by mouth every morning. 12/13/21  Yes [provider]  lidocaine (LIDODERM) 5 % Place 1 patch onto the skin daily as needed. Remove & Discard patch within 12 hours or as directed by MD Patient taking differently: Place 1 patch onto the skin daily as needed (for knee pain). Remove & Discard patch within 12 hours or as directed by MD 01/22/22  Yes Jeanell Sparrow, DO  methocarbamol (ROBAXIN) 500  MG tablet Take 500 mg by mouth 2 (two) times daily.   Yes [provider]  metoprolol succinate (TOPROL-XL) 25 MG 24 hr tablet Take 3 tablets (75 mg total) by mouth daily. 12/18/21  Yes Freada Bergeron, MD  Multiple Vitamins-Minerals (OCUVITE PO) Take 1 tablet by mouth daily.   Yes [provider]  potassium chloride (KLOR-CON M) 10 MEQ tablet Take 1 tablet (10 mEq total) by mouth daily. 12/18/21  Yes Freada Bergeron, MD  acetaminophen (TYLENOL) 325 MG tablet Take 2 tablets (650 mg total) by mouth every 6 (six) hours as needed. 01/22/22   Jeanell Sparrow, DO  acetaminophen (TYLENOL) 500 MG tablet Take 1,000 mg by mouth daily.    [provider]      Allergies    Morphine and related, Penicillins, Lasix [furosemide], Aspirin, Clindamycin/lincomycin, Crestor [rosuvastatin], Diltiazem, Erythromycin, Lisinopril, Metronidazole, and Penicillins cross reactors    Review of Systems   Review of Systems  Neurological:  Positive for weakness.  All other systems reviewed and are negative.   Physical Exam Updated Vital Signs BP 101/82 (BP Location: Right Arm)   Pulse 72   Temp 100 F (37.8 C) (Oral)   Resp 19   Ht '5\' 3"'$  (1.6 m)   Wt 88.5 kg   SpO2 96%   BMI 34.54 kg/m  Physical Exam Vitals and nursing note reviewed.  Constitutional:      Appearance: She is well-developed.  HENT:     Head: Normocephalic and atraumatic.  Cardiovascular:     Rate and Rhythm: Normal rate and regular rhythm.     Heart sounds: Murmur heard.  Pulmonary:     Effort: Pulmonary effort is normal. No respiratory distress.     Breath sounds: Normal breath sounds.  Abdominal:     Palpations: Abdomen is soft.     Tenderness: There is no abdominal tenderness. There is no guarding or rebound.  Musculoskeletal:        General: Swelling present. No tenderness.     Comments: 2+ DP pulses bilaterally.  Erythema and edema to LLE with petechia to the distal LLE.  Erythema involves shin,  ankle and streaks to the posterior popliteal fossa.   Skin:    General: Skin is warm and dry.  Neurological:     Mental Status: She is alert.     Comments: 4/5 strength in all four extremities.  Unable to fully gaze laterally in left eye.  Mild right lid droop.  Visual fields grossly intact.  Oriented to person.  Disoriented to place and time.  Slow to answer questions .  Psychiatric:        Behavior: Behavior normal.     ED Results / Procedures / Treatments   Labs (all labs ordered are listed, but only abnormal results are displayed) Labs Reviewed  COMPREHENSIVE METABOLIC PANEL - Abnormal; Notable for the following components:      Result Value   Glucose, Bld 127 (*)    BUN 29 (*)    Creatinine, Ser 1.25 (*)    GFR, Estimated 40 (*)    All other components within normal limits  LACTIC ACID, PLASMA - Abnormal; Notable for the following components:   Lactic Acid, Venous 3.4 (*)    All other components within normal limits  LACTIC ACID, PLASMA - Abnormal; Notable for the following components:   Lactic Acid, Venous >9.0 (*)    All other components within normal limits  CBC WITH DIFFERENTIAL/PLATELET - Abnormal; Notable for the following components:   WBC 16.9 (*)    Platelets 113 (*)    Neutro Abs 14.7 (*)    Monocytes Absolute 1.1 (*)    Abs Immature Granulocytes 0.30 (*)    All other components within normal limits  PROTIME-INR - Abnormal; Notable for the following components:   Prothrombin Time 16.2 (*)    INR 1.3 (*)    All other components within normal limits  URINALYSIS, ROUTINE W REFLEX MICROSCOPIC - Abnormal; Notable for the following components:   Color, Urine AMBER (*)    APPearance CLOUDY (*)    Protein, ur 100 (*)    Leukocytes,Ua MODERATE (*)    WBC, UA >50 (*)    Bacteria, UA RARE (*)    All other components within normal limits  LACTIC ACID, PLASMA - Abnormal; Notable for the following components:   Lactic Acid, Venous 3.2 (*)    All other components  within normal limits  RESP PANEL BY RT-PCR (FLU A&B, COVID) [id number]  CULTURE, BLOOD (ROUTINE X 2)  CULTURE, BLOOD (ROUTINE X 2)  LACTIC ACID, PLASMA    EKG EKG Interpretation  Date/Time:  Monday February 12 2022 02:01:45 EDT Ventricular Rate:  68 PR Interval:  192 QRS Duration: 97 QT Interval:  442 QTC Calculation: 471 R Axis:   16 Text Interpretation: Sinus rhythm Anteroseptal infarct, old Repol abnrm, severe global ischemia (LM/MVD) Confirmed by Quintella Reichert [phone]) on 02/12/2022 2:07:45 AM  Radiology CT Head Wo Contrast  Result Date: 02/12/2022 CLINICAL DATA:  Altered mental status. EXAM: CT HEAD WITHOUT CONTRAST TECHNIQUE: Contiguous axial images were obtained from the base of the skull through the vertex without intravenous contrast. RADIATION DOSE REDUCTION: This exam was performed according to the departmental dose-optimization program which includes automated exposure control, adjustment of the mA and/or kV according to patient size and/or use of iterative reconstruction technique. COMPARISON:  Head CT dated 01/22/2022. FINDINGS: Brain: Mild age-related atrophy and chronic microvascular ischemic changes. There is no acute intracranial hemorrhage. No mass effect or midline shift no extra-axial fluid collection. Vascular: No hyperdense vessel or unexpected calcification. Skull: Normal. Negative for fracture or focal lesion. Sinuses/Orbits: No acute finding. Other: None IMPRESSION: 1. No acute intracranial pathology. 2. Mild age-related atrophy and chronic microvascular ischemic changes. Electronically Signed   By: Anner Crete M.D.   On: 02/12/2022 01:15   DG Chest 2 View  Result Date: 02/12/2022 CLINICAL DATA:  Suspected sepsis. Nonproductive cough for 1 week, weakness. EXAM: CHEST - 2 VIEW COMPARISON:  01/05/2021, 12/20/2019. FINDINGS: Heart is enlarged the mediastinal contour is stable. Atherosclerotic calcification of the aorta is noted. Strandy opacities are present at the  left lung base and there is blunting of the left costophrenic angle. Mild airspace disease is noted in the right upper lobe. No pneumothorax. Degenerative changes are noted in the thoracic spine. Stable compression deformities are present in the mid and lower thoracic spine. IMPRESSION: 1. Mild airspace disease in the right upper lobe and left lung base, possible developing pneumonia. 2. Blunting of the left costophrenic angle, possible small pleural effusion. Electronically Signed   By: Brett Fairy M.D.   On: 02/12/2022 00:30    Procedures Procedures   CRITICAL CARE Performed by: Quintella Reichert   Total critical care time: 35 minutes  Critical care time was exclusive of separately billable procedures and treating other patients.  Critical care was necessary to treat or prevent imminent or life-threatening deterioration.  Critical care was time spent personally by me on the following activities: development of treatment plan with patient and/or surrogate as well as nursing, discussions with consultants, evaluation of patient's response to treatment, examination of patient, obtaining history from patient or surrogate, ordering and performing treatments and interventions, ordering and review of laboratory studies, ordering and review of radiographic studies, pulse oximetry and re-evaluation of patient's condition.  Medications Ordered in ED Medications  flecainide (TAMBOCOR) tablet 50 mg (has no administration in time range)  metoprolol succinate (TOPROL-XL) 24 hr tablet 75 mg (has no administration in time range)  levothyroxine (SYNTHROID) tablet 100 mcg (100 mcg Oral Given 02/12/22 0553)  apixaban (ELIQUIS) tablet 5 mg (has no administration in time range)  acetaminophen (TYLENOL) tablet 650 mg (650 mg Oral Given 02/12/22 0651)    Or  acetaminophen (TYLENOL) suppository 650 mg ( Rectal See Alternative 02/12/22 0651)  0.9 %  sodium chloride infusion ( Intravenous New Bag/Given 02/12/22 0546)   levofloxacin (LEVAQUIN) IVPB 750 mg (has no administration in time range)  diphenhydrAMINE (BENADRYL) capsule 25 mg (has no administration in time range)  vancomycin (VANCOCIN) IVPB 1000 mg/200 mL premix (has no administration in time range)  levofloxacin (LEVAQUIN) IVPB 750 mg (0 mg Intravenous Stopped 02/12/22 0156)  vancomycin (VANCOREADY) IVPB 1750 mg/350 mL (0 mg Intravenous Stopped 02/12/22 0507)  lactated ringers bolus 500 mL (0 mLs Intravenous Stopped 02/12/22 0340)  sodium chloride 0.9 % bolus 500 mL (500 mLs Intravenous New Bag/Given 02/12/22 0416)  ED Course/ Medical Decision Making/ A&P                           Medical Decision Making Amount and/or Complexity of Data Reviewed Labs: ordered. Radiology: ordered.  Risk Prescription drug management. Decision regarding hospitalization.   Patient here for evaluation of chills, malaise.  She does have significant erythema to the left lower extremity concerning for cellulitis.  CBC with leukocytosis.  Code sepsis was initiated and she was treated with broad-spectrum antibiotics as well as IV fluids.  UA is also concerning for UTI.  Chest x-[place] with possible infiltrate in the right upper lobe.  Discussed with patient and family findings of studies and recommendation for admission for ongoing care and they are in agreement with treatment plan.  Medicine consulted for admission.        Final Clinical Impression(s) / ED Diagnoses Final diagnoses:  Sepsis, due to unspecified organism, unspecified whether acute organ dysfunction present [facility])  Acute UTI  Cellulitis of left lower extremity    Rx / DC Orders ED Discharge Orders     None         Quintella Reichert, MD 02/12/22 [phone]

## 2022-02-12 NOTE — ED Notes (Signed)
Pt's face reddened. MD aware. Vancomycin paused.

## 2022-02-12 NOTE — ED Notes (Signed)
Pt returned from radiology.

## 2022-02-12 NOTE — Progress Notes (Signed)
A consult was received from an ED physician for Vancomycin and Levaquin per pharmacy dosing.  The patient's profile has been reviewed for ht/wt/allergies/indication/available labs.    For Levaquin, Pharmacist asked to investigate beta-lactam allergy. If history of intolerance, mild allergy, or documented history of use of cephalosporins, pharmacy can adjust levofloxacin to ceftriaxone.  Per Epic, there is no history of cephalosporin use.  A one time order has been placed for Vancomycin '1750mg'$  IV and Levaquin '750mg'$  IV.    Further antibiotics/pharmacy consults should be ordered by admitting physician if indicated.                       Thank you, Everette Rank, PharmD 02/12/2022  12:16 AM

## 2022-02-12 NOTE — Progress Notes (Signed)
[facility] Progress Note  Patient: [name]     AST:[id number]  DOA: 02/11/2022   PCP: Aretta Nip, MD       Brief hospital course:   This is a 87 year old female with atrial fibrillation, moderate aortic stenosis, hypertension, pacemaker, chronic diastolic heart failure who lives in independent living at friend's home. Per history obtained from her daughter, the patient was severely confused on 9/3.  She had loss of bowel and bladder control.  She noted that the patient was urinating frequently during the day.  She also noted redness and swelling of the right lower extremity. In the hospital, she was noted to have a WBC count of 16.9 and was confused.  Lactic acid was initially 3.4. She has been noted to have a temperature of 100,  Subjective:  Patient has a complaint of some pain in her right leg.  No further frequent urination.  Per daughter she remains confused but not as severe as yesterday.  Assessment and Plan: Principal Problem: Severe sepsis ([place]) with metabolic encephalopathy and lactic acidosis -Extensive cellulitis of her right leg going almost up to her knee - Based on history, she may have a UTI-UA has rare bacteria but greater than 50 WBCs-unfortunately urine culture was not done - Remains confused today - Lactic acid improved to normal  Active Problems: Cough/Neri infiltrates - Suspicion of infiltrates on chest x-[place] - Daughter states the patient has a chronic cough which is no worse than before - Influenza and COVID noted to be negative    Essential hypertension, benign-history of diastolic heart failure AKI - Baseline creatinine is about 0.8-1 - Presented with a creatinine of 1.25 - She received 500 cc bolus in the ED yesterday and continuous fluids since then - Hold off on IV fluids - We will also hold 20 mg of Lasix that she takes daily at home and follow oral intake    Hypothyroidism -Continue levothyroxine    Paroxysmal atrial  fibrillation (HCC) -Continue apixaban and metoprolol    DVT prophylaxis: Apixaban   Code Status: Full Code  Consultants: None Level of Care: Level of care: Telemetry Disposition Plan:  Status is: Inpatient   Objective:   Vitals:   02/12/22 0622 02/12/22 0938 02/12/22 0944 02/12/22 1343  BP: 101/82 (!) 128/48 (!) 128/48 (!) 110/45  Pulse: 72 61 66   Resp: 19  16   Temp: 100 F (37.8 C)  99 F (37.2 C)   TempSrc: Oral  Oral   SpO2: 96%  96%   Weight:      Height:       Filed Weights   02/11/22 2340  Weight: 88.5 kg   Exam: General exam: Appears comfortable  HEENT: oral mucosa moist Respiratory system: Clear to auscultation.  Cardiovascular system: S1 & S2 heard  Gastrointestinal system: Abdomen soft, non-tender, nondistended. Normal bowel sounds   Extremities: No cyanosis, clubbing -extensive left lower extremity erythema going from foot almost up to the knee with edema Psychiatry:  Mood & affect appropriate.    Imaging and lab data was personally reviewed    CBC: Recent Labs  Lab 02/11/22 2351 02/12/22 0815  WBC 16.9* 13.2*  NEUTROABS 14.7*  --   HGB 13.3 11.6*  HCT 41.4 35.7*  MCV 99.8 99.4  PLT 113* 90*   Basic Metabolic Panel: Recent Labs  Lab 02/11/22 2351 02/12/22 0815  NA 139 137  K 3.8 3.5  CL 105 106  CO2 23 23  GLUCOSE 127* 116*  BUN 29* 32*  CREATININE 1.25* 1.17*  CALCIUM 8.9 8.1*   GFR: Estimated Creatinine Clearance: 32.4 mL/min (A) (by C-G formula based on SCr of 1.17 mg/dL (H)).  Scheduled Meds:  apixaban  5 mg Oral BID   [START ON 02/13/2022] diphenhydrAMINE  25 mg Oral Q48H   flecainide  50 mg Oral BID   levothyroxine  100 mcg Oral q morning   metoprolol succinate  75 mg Oral Daily   Continuous Infusions:  [START ON 02/14/2022] levofloxacin (LEVAQUIN) IV     [START ON 02/14/2022] vancomycin       LOS: 0 days   Author: Debbe Odea  02/12/2022 3:56 PM

## 2022-02-12 NOTE — Progress Notes (Signed)
Pharmacy Antibiotic Note  [name] is a 88 y.o. female admitted on 02/11/2022 with weakness; elink following for sepsis protocol.  Pharmacy has been consulted for Vancomycin and Levaquin dosing for cellulitis.  In the ED patient received Levaquin '750mg'$  IV x 1 and Vancomycin '1750mg'$  IV x 1 dose. Noted that with Vancomycin dose patient's face reddened.  Vancomycin infusion paused and then resumed.  Plan: Levaquin '750mg'$  IV q48h Vancomycin 1000 mg IV Q 48 hrs. Goal AUC 400-550.  Expected AUC: 468.5  SCr used: 1.25 Due to patient's reaction with facial reddening with Vancomycin loading dose, Dr Hal Hope called pharmacy and asked that Benadryl '25mg'$  po be given prior to each Vancomycin dose and that Vancomycin be infused slowly Follow renal function F/u culture results and sensitivities  Height: '5\' 3"'$  (160 cm) Weight: 88.5 kg (195 lb) IBW/kg (Calculated) : 52.4  Temp (24hrs), Avg:99 F (37.2 C), Min:98.3 F (36.8 C), Max:99.4 F (37.4 C)  Recent Labs  Lab 02/11/22 2351 02/12/22 0235  WBC 16.9*  --   CREATININE 1.25*  --   LATICACIDVEN 3.4* >9.0*    Estimated Creatinine Clearance: 30.3 mL/min (A) (by C-G formula based on SCr of 1.25 mg/dL (H)).      Antimicrobials this admission: 9/4 Levaquin >>   9/4 Vancomycin >>    Dose adjustments this admission:    Microbiology results: 9/3 BCx:      Thank you for allowing pharmacy to be a part of this patient's care.  Everette Rank, PharmD 02/12/2022 4:25 AM

## 2022-02-13 DIAGNOSIS — A419 Sepsis, unspecified organism: Secondary | ICD-10-CM | POA: Diagnosis not present

## 2022-02-13 LAB — BASIC METABOLIC PANEL
Anion gap: 5 (ref 5–15)
BUN: 23 mg/dL (ref 8–23)
CO2: 26 mmol/L (ref 22–32)
Calcium: 8.5 mg/dL — ABNORMAL LOW (ref 8.9–10.3)
Chloride: 108 mmol/L (ref 98–111)
Creatinine, Ser: 0.9 mg/dL (ref 0.44–1.00)
GFR, Estimated: 60 mL/min — ABNORMAL LOW (ref >60)
Glucose, Bld: 99 mg/dL (ref 70–99)
Potassium: 3.7 mmol/L (ref 3.5–5.1)
Sodium: 139 mmol/L (ref 135–145)

## 2022-02-13 LAB — CBC
HCT: 37.7 % (ref 36.0–46.0)
Hemoglobin: 12.3 g/dL (ref 12.0–15.0)
MCH: 32.1 pg (ref 26.0–34.0)
MCHC: 32.6 g/dL (ref 30.0–36.0)
MCV: 98.4 fL (ref 80.0–100.0)
Platelets: 80 10*3/uL — ABNORMAL LOW (ref 150–400)
RBC: 3.83 MIL/uL — ABNORMAL LOW (ref 3.87–5.11)
RDW: 13.2 % (ref 11.5–15.5)
WBC: 11.4 10*3/uL — ABNORMAL HIGH (ref 4.0–10.5)
nRBC: 0 % (ref 0.0–0.2)

## 2022-02-13 MED ORDER — POTASSIUM CHLORIDE CRYS ER 20 MEQ PO TBCR
40.0000 meq | EXTENDED_RELEASE_TABLET | Freq: Once | ORAL | Status: AC
  Administered 2022-02-13: 40 meq via ORAL
  Filled 2022-02-13: qty 2

## 2022-02-13 MED ORDER — LEVOFLOXACIN 750 MG PO TABS: 750.0000 mg | ORAL_TABLET | ORAL | Status: DC

## 2022-02-13 MED ORDER — LEVOFLOXACIN IN D5W 750 MG/150ML IV SOLN
750.0000 mg | INTRAVENOUS | Status: DC
  Administered 2022-02-14: 750 mg via INTRAVENOUS
  Filled 2022-02-13: qty 150

## 2022-02-13 NOTE — Progress Notes (Addendum)
[facility] Progress Note  Patient: [name]     KDX:[id number]  DOA: 02/11/2022   PCP: Aretta Nip, MD       Brief hospital course:   This is a 88 year old female with atrial fibrillation, moderate aortic stenosis, hypertension, pacemaker, chronic diastolic heart failure who lives in independent living at friend's home. Per history obtained from her daughter, the patient was severely confused on 9/3.  She had loss of bowel and bladder control.  She noted that the patient was urinating frequently during the day.  She also noted redness and swelling of the right lower extremity. In the hospital, she was noted to have a WBC count of 16.9 and was confused.  Lactic acid was initially 3.4. She has been noted to have a temperature of 100,   She was started on Vancomycin and Levaquin in the ED.  Subjective:  Continues to have pain in right leg.   Assessment and Plan: Principal Problem: Severe sepsis ([place]) with metabolic encephalopathy and lactic acidosis -Extensive cellulitis of her right leg going almost up to her knee -she had a fall and a minor injury to the left knee around 8/14 which may or may not be related to current infection - Lactic acid improved to normal - leukocytosis improving - mental status has returned to baseline per her daughter - Based on history, she may also have a UTI-UA has rare bacteria but greater than 50 WBCs-unfortunately urine culture was not done as patient was a poor historian - erythema does not look any better than yesterday - She is on Eliquis so less chance of DVT in that leg- hold off on ultrasound - She was started on Levaquin on admission- can continue this- stopping Vanc as mentioned below   Active Problems: Acute thrombocytopenia - ? Due to infection vs Vanc - dc Vanc today - did not receive any Lovenox/ Heparin - asked hematology for consult - recheck tomorrow  Red Man's syndrome - facial flushing secondary to Vanc when  first admitted  Cough/pulmonary infiltrates - Suspicion of infiltrates on chest x-[place]- lungs clear on exam - Daughters state the patient has a chronic cough which is no worse than before - Influenza and COVID noted to be negative   Essential hypertension, benign-history of diastolic heart failure AKI - Baseline creatinine is about 0.8-1 - Presented with a creatinine of 1.25 - She received 500 cc bolus in the ED and continuous fluids after that- stopped on 9/4 - We will also hold 20 mg of Lasix that she takes daily at home and follow oral intake- daughter still stating she is not drinking as much as she does at home  Hypokalemia - mild - replace with 40 meq today    Hypothyroidism -Continue levothyroxine    Paroxysmal atrial fibrillation (HCC) -Continue apixaban and metoprolol    DVT prophylaxis: Apixaban   Code Status: Full Code  Consultants: None Level of Care: Level of care: Telemetry Disposition Plan:  Status is: Inpatient   Objective:   Vitals:   02/12/22 1343 02/12/22 1817 02/12/22 2020 02/13/22 0431  BP: (!) 110/45 (!) 126/51 (!) 126/32 (!) 148/51  Pulse:  66 60 61  Resp:  16  18  Temp:  98.2 F (36.8 C) 98.5 F (36.9 C) 99 F (37.2 C)  TempSrc:  Oral Oral Oral  SpO2:  94% 94% 93%  Weight:      Height:       Filed Weights   02/11/22 2340  Weight:  88.5 kg   Exam: General exam: Appears comfortable  HEENT: oral mucosa moist Respiratory system: Clear to auscultation.  Cardiovascular system: S1 & S2 heard  Gastrointestinal system: Abdomen soft, non-tender, nondistended. Normal bowel sounds   Extremities: No cyanosis, clubbing - red swollen left leg with tenderness Psychiatry:  Mood & affect appropriate.     Imaging and lab data was personally reviewed    CBC: Recent Labs  Lab 02/11/22 2351 02/12/22 0815 02/13/22 0352  WBC 16.9* 13.2* 11.4*  NEUTROABS 14.7*  --   --   HGB 13.3 11.6* 12.3  HCT 41.4 35.7* 37.7  MCV 99.8 99.4 98.4  PLT 113* 90*  80*    Basic Metabolic Panel: Recent Labs  Lab 02/11/22 2351 02/12/22 0815 02/12/22 1624  NA 139 137 138  K 3.8 3.5 3.4*  CL 105 106 108  CO2 '23 23 22  '$ GLUCOSE 127* 116* 119*  BUN 29* 32* 32*  CREATININE 1.25* 1.17* 1.09*  CALCIUM 8.9 8.1* 8.6*    GFR: Estimated Creatinine Clearance: 34.7 mL/min (A) (by C-G formula based on SCr of 1.09 mg/dL (H)).  Scheduled Meds:  apixaban  5 mg Oral BID   Chlorhexidine Gluconate Cloth  6 each Topical Q0600   diphenhydrAMINE  25 mg Oral Q48H   flecainide  50 mg Oral BID   levothyroxine  100 mcg Oral q morning   metoprolol succinate  75 mg Oral Daily   mupirocin ointment  1 Application Nasal BID   Continuous Infusions:  [START ON 02/14/2022] levofloxacin (LEVAQUIN) IV     [START ON 02/14/2022] vancomycin       LOS: 1 day   Author: Debbe Odea  02/13/2022 7:26 AM

## 2022-02-13 NOTE — Progress Notes (Signed)
Copies of [place] documents provided to RN by family member, placed in chart.  Angie Fava, RN

## 2022-02-13 NOTE — Progress Notes (Signed)
PHARMACIST - PHYSICIAN COMMUNICATION  CONCERNING: Antibiotic IV to Oral Route Change Policy  RECOMMENDATION: This patient is receiving levaquin by the intravenous route.  Based on criteria approved by the Pharmacy and Therapeutics Committee, the antibiotic(s) is/are being converted to the equivalent oral dose form(s).   DESCRIPTION: These criteria include: Patient being treated for a respiratory tract infection, urinary tract infection, cellulitis or clostridium difficile associated diarrhea if on metronidazole The patient is not neutropenic and does not exhibit a GI malabsorption state The patient is eating (either orally or via tube) and/or has been taking other orally administered medications for a least 24 hours The patient is improving clinically and has a Tmax < 100.5  If you have questions about this conversion, please contact the Pharmacy Department  []  ( [phone] )  [place] []  ( [phone] )  [facility] []  ( [phone]   []  ( [phone] )  [facility] [x]  ( [phone] )  [facility]   

## 2022-02-14 DIAGNOSIS — I480 Paroxysmal atrial fibrillation: Secondary | ICD-10-CM

## 2022-02-14 DIAGNOSIS — A419 Sepsis, unspecified organism: Secondary | ICD-10-CM | POA: Diagnosis not present

## 2022-02-14 DIAGNOSIS — I10 Essential (primary) hypertension: Secondary | ICD-10-CM

## 2022-02-14 DIAGNOSIS — L03116 Cellulitis of left lower limb: Secondary | ICD-10-CM | POA: Diagnosis not present

## 2022-02-14 LAB — CBC
HCT: 37.3 % (ref 36.0–46.0)
Hemoglobin: 12.1 g/dL (ref 12.0–15.0)
MCH: 32.1 pg (ref 26.0–34.0)
MCHC: 32.4 g/dL (ref 30.0–36.0)
MCV: 98.9 fL (ref 80.0–100.0)
Platelets: 97 10*3/uL — ABNORMAL LOW (ref 150–400)
RBC: 3.77 MIL/uL — ABNORMAL LOW (ref 3.87–5.11)
RDW: 13.2 % (ref 11.5–15.5)
WBC: 9.3 10*3/uL (ref 4.0–10.5)
nRBC: 0 % (ref 0.0–0.2)

## 2022-02-14 LAB — BASIC METABOLIC PANEL
Anion gap: 4 — ABNORMAL LOW (ref 5–15)
BUN: 19 mg/dL (ref 8–23)
CO2: 26 mmol/L (ref 22–32)
Calcium: 8.4 mg/dL — ABNORMAL LOW (ref 8.9–10.3)
Chloride: 109 mmol/L (ref 98–111)
Creatinine, Ser: 0.81 mg/dL (ref 0.44–1.00)
GFR, Estimated: >60 mL/min (ref >60)
Glucose, Bld: 102 mg/dL — ABNORMAL HIGH (ref 70–99)
Potassium: 3.9 mmol/L (ref 3.5–5.1)
Sodium: 139 mmol/L (ref 135–145)

## 2022-02-14 LAB — CBC WITH DIFFERENTIAL/PLATELET
Abs Immature Granulocytes: 0.12 10*3/uL — ABNORMAL HIGH (ref 0.00–0.07)
Basophils Absolute: 0 10*3/uL (ref 0.0–0.1)
Basophils Relative: 0 %
Eosinophils Absolute: 0.2 10*3/uL (ref 0.0–0.5)
Eosinophils Relative: 2 %
HCT: 36.8 % (ref 36.0–46.0)
Hemoglobin: 12.2 g/dL (ref 12.0–15.0)
Immature Granulocytes: 1 %
Lymphocytes Relative: 9 %
Lymphs Abs: 0.8 10*3/uL (ref 0.7–4.0)
MCH: 32.4 pg (ref 26.0–34.0)
MCHC: 33.2 g/dL (ref 30.0–36.0)
MCV: 97.9 fL (ref 80.0–100.0)
Monocytes Absolute: 0.7 10*3/uL (ref 0.1–1.0)
Monocytes Relative: 7 %
Neutro Abs: 7.5 10*3/uL (ref 1.7–7.7)
Neutrophils Relative %: 81 %
Platelets: 95 10*3/uL — ABNORMAL LOW (ref 150–400)
RBC: 3.76 MIL/uL — ABNORMAL LOW (ref 3.87–5.11)
RDW: 13.2 % (ref 11.5–15.5)
WBC: 9.4 10*3/uL (ref 4.0–10.5)
nRBC: 0 % (ref 0.0–0.2)

## 2022-02-14 LAB — MAGNESIUM: Magnesium: 2 mg/dL (ref 1.7–2.4)

## 2022-02-14 MED ORDER — LINEZOLID 600 MG PO TABS
600.0000 mg | ORAL_TABLET | Freq: Two times a day (BID) | ORAL | Status: DC
  Administered 2022-02-15 – 2022-02-16 (×3): 600 mg via ORAL
  Filled 2022-02-14 (×3): qty 1

## 2022-02-14 NOTE — Progress Notes (Signed)
Mobility Specialist - Progress Note   02/14/22 1137  Mobility  Activity Ambulated with assistance to bathroom;Ambulated with assistance in hallway  Level of Assistance Minimal assist, patient does 75% or more  Assistive Device Front wheel walker  Distance Ambulated (ft) 50 ft  Activity Response Tolerated well  $Mobility charge 1 Mobility   Pt was found in recliner chair and agreeable to mobilize. Pt used the restroom before ambulating in hallway. Pt was min-A when getting up from chair and bathroom stall. Was contact guard for ambulating with RW and stated her legs were swollen. At EOS returned to recliner chair with chair alarm on, all necessities in reach, and family in room.  Ferd Hibbs Mobility Specialist

## 2022-02-14 NOTE — Evaluation (Signed)
Occupational Therapy Evaluation Patient Details Name: [name] MRN: [id number] DOB: April 10, 1928 Today's Date: 02/14/2022   History of Present Illness Patient is a 88 year old female admitted with confusion, sepsis. Hx of Afib, aortic stenosis, pacemaker, chronic HF   Clinical Impression   Patient is a 88 year old female who was admitted for above. Patient was noted to have increased pain in LLE with edema and redness. Nursing aware. Patient was noted to have LOB posteriorly with no UE support and decreased safety awareness impacting participation in ADLs. Patient lives in independent living at rollator level at baseline. Patient would continue to benefit from skilled OT services at this time while admitted and after d/c to address noted deficits in order to improve overall safety and independence in ADLs.        Recommendations for follow up therapy are one component of a multi-disciplinary discharge planning process, led by the attending physician.  Recommendations may be updated based on patient status, additional functional criteria and insurance authorization.   Follow Up Recommendations  Skilled nursing-short term rehab (<3 hours/day)    Assistance Recommended at Discharge Frequent or constant Supervision/Assistance  Patient can return home with the following A little help with walking and/or transfers;A little help with bathing/dressing/bathroom;Assistance with cooking/housework;Direct supervision/assist for financial management;Assist for transportation;Help with stairs or ramp for entrance;Direct supervision/assist for medications management    Functional Status Assessment  Patient has had a recent decline in their functional status and demonstrates the ability to make significant improvements in function in a reasonable and predictable amount of time.  Equipment Recommendations  None recommended by OT    Recommendations for Other Services       Precautions / Restrictions  Precautions Precautions: Fall Restrictions Weight Bearing Restrictions: No      Mobility Bed Mobility               General bed mobility comments: patient seated EOB at start of session wtih daughter in room and returned to the same    Transfers                          Balance Overall balance assessment: Needs assistance   Sitting balance-Leahy Scale: Good       Standing balance-Leahy Scale: Poor Standing balance comment: with no UE support, posterior LOB with min A to maintain standing. unable to self correct                           ADL either performed or assessed with clinical judgement   ADL Overall ADL's : Needs assistance/impaired Eating/Feeding: Set up;Sitting Eating/Feeding Details (indicate cue type and reason): EOB, noted to have decreased awarness of items on tray knocked water glass off table Grooming: Wash/dry hands;Set up;Sitting;Wash/dry face   Upper Body Bathing: Minimal assistance;Sitting   Lower Body Bathing: Moderate assistance;Sit to/from stand;Sitting/lateral leans Lower Body Bathing Details (indicate cue type and reason): patient when asked to simulate reaching was noted to lean over on EOB close to edge poor safety awareness MIN guard to maintain sititng balance. Upper Body Dressing : Minimal assistance;Sitting   Lower Body Dressing: Moderate assistance;Sit to/from stand;Sitting/lateral leans Lower Body Dressing Details (indicate cue type and reason): noted to have pain in LLE with socks donning/doffing socks. patient noted to have increased edema in LLE. noted to have LOB standing with no BUE support posteriorly. Toilet Transfer: Minimal assistance;Rolling walker (2 wheels);Ambulation Toilet Transfer  Details (indicate cue type and reason): with cues for safety. Toileting- Clothing Manipulation and Hygiene: Moderate assistance;Sit to/from stand               Vision Baseline Vision/History: 1 Wears glasses        Perception     Praxis      Pertinent Vitals/Pain Pain Assessment Pain Assessment: Faces Faces Pain Scale: Hurts little more Pain Location: L LE Pain Descriptors / Indicators: Discomfort, Sore Pain Intervention(s): Limited activity within patient's tolerance, Monitored during session     Hand Dominance     Extremity/Trunk Assessment Upper Extremity Assessment Upper Extremity Assessment: Overall WFL for tasks assessed   Lower Extremity Assessment Lower Extremity Assessment: Defer to PT evaluation   Cervical / Trunk Assessment Cervical / Trunk Assessment: Normal   Communication Communication Communication: No difficulties   Cognition Arousal/Alertness: Awake/alert Behavior During Therapy: WFL for tasks assessed/performed Overall Cognitive Status: History of cognitive impairments - at baseline                                 General Comments: patients daugher in room. patient reported the year was 80, then 1773 then reported she was confused and knew the answer the previous day. patient was noted to not remember education provided about elevation of BLE during session at the end of session     General Comments       Exercises     Shoulder Instructions      Home Living Family/patient expects to be discharged to:: Skilled nursing facility Living Arrangements: Alone   Type of Home: Independent living facility Home Access: Level entry     Home Layout: One level               Home Equipment: Rollator (4 wheels)          Prior Functioning/Environment Prior Level of Function : Independent/Modified Independent             Mobility Comments: uses rollator for ambulation ADLs Comments: family reports pt gets in/out of shower 1x/week, patient reported using rollator or standing walker for functional mobility prior level.        OT Problem List: Decreased activity tolerance;Pain;Decreased safety awareness;Increased edema      OT  Treatment/Interventions: Self-care/ADL training;Therapeutic exercise;Neuromuscular education;Energy conservation;DME and/or AE instruction;Therapeutic activities;Balance training;Patient/family education    OT Goals(Current goals can be found in the care plan section) Acute Rehab OT Goals Patient Stated Goal: to get stronger OT Goal Formulation: With patient/family Time For Goal Achievement: 02/28/22 Potential to Achieve Goals: Fair  OT Frequency: Min 2X/week    Co-evaluation              AM-PAC OT "6 Clicks" Daily Activity     Outcome Measure Help from another person eating meals?: A Little Help from another person taking care of personal grooming?: A Little Help from another person toileting, which includes using toliet, bedpan, or urinal?: A Lot Help from another person bathing (including washing, rinsing, drying)?: A Lot Help from another person to put on and taking off regular upper body clothing?: A Little Help from another person to put on and taking off regular lower body clothing?: A Lot 6 Click Score: 15   End of Session Equipment Utilized During Treatment: Rolling walker (2 wheels) Nurse Communication: Other (comment) (BLE edema and change of socks)  Activity Tolerance: Patient tolerated treatment well Patient left: in bed;with call  bell/phone within reach  OT Visit Diagnosis: Pain;Unsteadiness on feet (R26.81);Muscle weakness (generalized) (M62.81)                Time: 5732-2025 OT Time Calculation (min): 23 min Charges:  OT General Charges $OT Visit: 1 Visit OT Evaluation $OT Eval Moderate Complexity: 1 Mod OT Treatments $Self Care/Home Management : 8-22 mins  Rennie Plowman, MS Acute Rehabilitation Department Office# [phone]   Marcellina Millin 02/14/2022, 4:33 PM

## 2022-02-14 NOTE — Progress Notes (Signed)
PROGRESS NOTE    [name]  WEX:[id number] DOB: 07/03/1927 DOA: 02/11/2022 PCP: Aretta Nip, MD   Brief Narrative:  88 year old female with atrial fibrillation, moderate aortic stenosis, hypertension, pacemaker, chronic diastolic heart failure presented with confusion, left lower extremity redness and swelling.  On presentation, she was confused and was found to have leukocytosis and lactic acidosis.  She was started on broad-spectrum antibiotic.  Assessment & Plan:   Severe sepsis: Present on admission Acute metabolic encephalopathy Lactic acidosis Leukocytosis: Resolved Left lower extremity cellulitis Possible UTI: Present on admission -Imaging of left lower extremity on presentation was negative for acute abnormality -Improved cellulitis.  Currently on Levaquin which will be continued. -Blood cultures negative so far.  Urine culture not available. -Mental status has much improved and possibly close to baseline -Fall precautions.  PT eval. -COVID/influenza testing negative on presentation  Physical deconditioning -PT eval.  Palliative care consultation for goals of care discussion  Acute thrombocytopenia -Possibly from infection.  Platelets improving to 95 today.  Essential hypertension Chronic diastolic heart failure -Blood pressure intermittently on the high side.  Monitor.  Continue metoprolol.  Lasix on hold.  Possibly resume Lasix on discharge. -Strict input and output.  Daily weights.  Outpatient follow-up with cardiology  AKI -Creatinine 1.25 on presentation.  Baseline creatinine of 0.8-1.  Currently close to baseline.  Monitor intermittently  -Anemia -Resolved  Hypothyroidism --continue levothyroxine  Paroxysmal A-fib -Currently rate controlled.  Continue apixaban, metoprolol and flecainide   DVT prophylaxis: Apixaban Code Status: Full Family Communication: Son at bedside Disposition Plan: Status is: Inpatient Remains inpatient appropriate  because: Of severity of illness    Consultants: Consult palliative care  Procedures: None Antimicrobials:  Anti-infectives (From admission, onward)    Start     Dose/Rate Route Frequency Ordered Stop   02/14/22 2200  levofloxacin (LEVAQUIN) tablet 750 mg  Status:  Discontinued        750 mg Oral Every 48 hours 02/13/22 0815 02/13/22 1542   02/14/22 0100  vancomycin (VANCOCIN) IVPB 1000 mg/200 mL premix  Status:  Discontinued        1,000 mg 200 mL/hr over 60 Minutes Intravenous Every 48 hours 02/12/22 0433 02/12/22 0434   02/14/22 0000  levofloxacin (LEVAQUIN) IVPB 750 mg  Status:  Discontinued        750 mg 100 mL/hr over 90 Minutes Intravenous Every 48 hours 02/12/22 0432 02/13/22 0815   02/14/22 0000  vancomycin (VANCOCIN) IVPB 1000 mg/200 mL premix  Status:  Discontinued        1,000 mg 100 mL/hr over 120 Minutes Intravenous Every 48 hours 02/12/22 0438 02/13/22 0727   02/14/22 0000  levofloxacin (LEVAQUIN) IVPB 750 mg       Note to Pharmacy: Please don't change to PO yet   750 mg 100 mL/hr over 90 Minutes Intravenous Every 48 hours 02/13/22 1542     02/12/22 0030  vancomycin (VANCOREADY) IVPB 1750 mg/350 mL        1,750 mg 175 mL/hr over 120 Minutes Intravenous  Once 02/12/22 0015 02/12/22 0507   02/12/22 0015  vancomycin (VANCOCIN) IVPB 1000 mg/200 mL premix  Status:  Discontinued        1,000 mg 200 mL/hr over 60 Minutes Intravenous  Once 02/12/22 0011 02/12/22 0015   02/12/22 0015  levofloxacin (LEVAQUIN) IVPB 750 mg        750 mg 100 mL/hr over 90 Minutes Intravenous  Once 02/12/22 0011 02/12/22 0156  Subjective: Patient seen and examined at bedside.  More awake this morning as per the son at bedside.  Left leg swelling and redness are improving.  No overnight fever, agitation reported.  Objective: Vitals:   02/13/22 1254 02/13/22 2024 02/13/22 2140 02/14/22 0659  BP: (!) 133/54 (!) 179/68 132/60 (!) 145/68  Pulse: 66 62 63 64  Resp: '20 20  20  '$ Temp:  98.1 F (36.7 C) 98.4 F (36.9 C)  98.8 F (37.1 C)  TempSrc: Oral Oral  Oral  SpO2: 93% 96% 94% 93%  Weight:      Height:        Intake/Output Summary (Last 24 hours) at 02/14/2022 1120 Last data filed at 02/14/2022 1028 Gross per 24 hour  Intake 905.48 ml  Output 1150 ml  Net -244.52 ml   Filed Weights   02/11/22 2340  Weight: 88.5 kg    Examination:  General exam: Appears calm and comfortable.  Currently on room air. Respiratory system: Bilateral decreased breath sounds at bases Cardiovascular system: S1 & S2 heard, Rate controlled Gastrointestinal system: Abdomen is nondistended, soft and nontender. Normal bowel sounds heard. Extremities: No cyanosis, clubbing; Bilateral lower extremity edema present  Central nervous system: Alert and oriented.  Still slow to respond but answers some questions appropriately.  No focal neurological deficits. Moving extremities Skin: Left lower extremity swelling, erythema, mild tenderness present below the knee up to almost the ankle Psychiatry: Flat affect. Not agitated.   Data Reviewed: I have personally reviewed following labs and imaging studies  CBC: Recent Labs  Lab 02/11/22 2351 02/12/22 0815 02/13/22 0352 02/14/22 0415 02/14/22 0808  WBC 16.9* 13.2* 11.4* 9.3 9.4  NEUTROABS 14.7*  --   --   --  7.5  HGB 13.3 11.6* 12.3 12.1 12.2  HCT 41.4 35.7* 37.7 37.3 36.8  MCV 99.8 99.4 98.4 98.9 97.9  PLT 113* 90* 80* 97* 95*   Basic Metabolic Panel: Recent Labs  Lab 02/11/22 2351 02/12/22 0815 02/12/22 1624 02/13/22 1609 02/14/22 0808  NA 139 137 138 139 139  K 3.8 3.5 3.4* 3.7 3.9  CL 105 106 108 108 109  CO2 '23 23 22 26 26  '$ GLUCOSE 127* 116* 119* 99 102*  BUN 29* 32* 32* 23 19  CREATININE 1.25* 1.17* 1.09* 0.90 0.81  CALCIUM 8.9 8.1* 8.6* 8.5* 8.4*  MG  --   --   --   --  2.0   GFR: Estimated Creatinine Clearance: 46.7 mL/min (by C-G formula based on SCr of 0.81 mg/dL). Liver Function Tests: Recent Labs  Lab  02/11/22 2351  AST 35  ALT 17  ALKPHOS 44  BILITOT 1.1  PROT 6.7  ALBUMIN 3.5   No results for input(s): "LIPASE", "AMYLASE" in the last 168 hours. No results for input(s): "AMMONIA" in the last 168 hours. Coagulation Profile: Recent Labs  Lab 02/11/22 2351  INR 1.3*   Cardiac Enzymes: No results for input(s): "CKTOTAL", "CKMB", "CKMBINDEX", "TROPONINI" in the last 168 hours. BNP (last 3 results) No results for input(s): "PROBNP" in the last 8760 hours. HbA1C: No results for input(s): "HGBA1C" in the last 72 hours. CBG: No results for input(s): "GLUCAP" in the last 168 hours. Lipid Profile: No results for input(s): "CHOL", "HDL", "LDLCALC", "TRIG", "CHOLHDL", "LDLDIRECT" in the last 72 hours. Thyroid Function Tests: No results for input(s): "TSH", "T4TOTAL", "FREET4", "T3FREE", "THYROIDAB" in the last 72 hours. Anemia Panel: No results for input(s): "VITAMINB12", "FOLATE", "FERRITIN", "TIBC", "IRON", "RETICCTPCT" in the last 72 hours.  Sepsis Labs: Recent Labs  Lab 02/11/22 2351 02/12/22 0235 02/12/22 0340 02/12/22 0657  LATICACIDVEN 3.4* >9.0* 3.2* 1.6    Recent Results (from the past 240 hour(s))  Culture, blood (Routine x 2)     Status: None (Preliminary result)   Collection Time: 02/11/22 11:51 PM   Specimen: BLOOD  Result Value Ref Range Status   Specimen Description   Final    BLOOD RIGHT ANTECUBITAL Performed at [facility] [address] [place], [place] [address]    Special Requests   Final    BOTTLES DRAWN AEROBIC AND ANAEROBIC Blood Culture adequate volume Performed at [facility] [address], [place], [place] [address]    Culture   Final    NO GROWTH 2 DAYS Performed at [facility] [address], [place], [place] [address]    Report Status PENDING  Incomplete  Culture, blood (Routine x 2)     Status: None (Preliminary result)   Collection Time: 02/11/22 11:56 PM   Specimen: BLOOD  Result  Value Ref Range Status   Specimen Description   Final    BLOOD LEFT ANTECUBITAL Performed at [facility] [address] [place], [place] [address]    Special Requests   Final    BOTTLES DRAWN AEROBIC AND ANAEROBIC Blood Culture results may not be optimal due to an inadequate volume of blood received in culture bottles Performed at [facility] [address], [place], [place] [address]    Culture   Final    NO GROWTH 2 DAYS Performed at [facility] [address], [place],  [address]    Report Status PENDING  Incomplete  Resp Panel by RT-PCR (Flu A&B, Covid) Anterior Nasal Swab     Status: None   Collection Time: 02/12/22  1:00 AM   Specimen: Anterior Nasal Swab  Result Value Ref Range Status   SARS Coronavirus 2 by RT PCR NEGATIVE NEGATIVE Final    Comment: (NOTE) SARS-CoV-2 target nucleic acids are NOT DETECTED.  The SARS-CoV-2 RNA is generally detectable in upper respiratory specimens during the acute phase of infection. The lowest concentration of SARS-CoV-2 viral copies this assay can detect is 138 copies/mL. A negative result does not preclude SARS-Cov-2 infection and should not be used as the sole basis for treatment or other patient management decisions. A negative result may occur with  improper specimen collection/handling, submission of specimen other than nasopharyngeal swab, presence of viral mutation(s) within the areas targeted by this assay, and inadequate number of viral copies(<138 copies/mL). A negative result must be combined with clinical observations, patient history, and epidemiological information. The expected result is Negative.  Fact Sheet for Patients:  EntrepreneurPulse.com.au  Fact Sheet for Healthcare Providers:  IncredibleEmployment.be  This test is no t yet approved or cleared by the [place] FDA and  has been authorized for detection and/or  diagnosis of SARS-CoV-2 by FDA under an Emergency Use Authorization (EUA). This EUA will remain  in effect (meaning this test can be used) for the duration of the COVID-19 declaration under Section 564(b)(1) of the Act, 21 U.S.C.section 360bbb-3(b)(1), unless the authorization is terminated  or revoked sooner.       Influenza A by PCR NEGATIVE NEGATIVE Final   Influenza B by PCR NEGATIVE NEGATIVE Final    Comment: (NOTE) The Xpert Xpress SARS-CoV-2/FLU/RSV plus assay is intended as an aid in the diagnosis of influenza from Nasopharyngeal swab specimens and  should not be used as a sole basis for treatment. Nasal washings and aspirates are unacceptable for Xpert Xpress SARS-CoV-2/FLU/RSV testing.  Fact Sheet for Patients: EntrepreneurPulse.com.au  Fact Sheet for Healthcare Providers: IncredibleEmployment.be  This test is not yet approved or cleared by the [place] FDA and has been authorized for detection and/or diagnosis of SARS-CoV-2 by FDA under an Emergency Use Authorization (EUA). This EUA will remain in effect (meaning this test can be used) for the duration of the COVID-19 declaration under Section 564(b)(1) of the Act, 21 U.S.C. section 360bbb-3(b)(1), unless the authorization is terminated or revoked.  Performed at [facility], [place] [address], [place], [place] [address]   MRSA Next Gen by PCR, Nasal     Status: Abnormal   Collection Time: 02/12/22  1:01 PM   Specimen: Nasal Mucosa; Nasal Swab  Result Value Ref Range Status   MRSA by PCR Next Gen DETECTED (A) NOT DETECTED Final    Comment: CRITICAL RESULT CALLED TO, READ BACK BY AND VERIFIED WITH: HENSON,C AT 1624 ON 02/12/22 BY LUZOLOP (NOTE) The GeneXpert MRSA Assay (FDA approved for NASAL specimens only), is one component of a comprehensive MRSA colonization surveillance program. It is not intended to diagnose MRSA infection nor to guide or monitor  treatment for MRSA infections. Test performance is not FDA approved in patients less than 64 years old. Performed at [facility], [place] [address], [place], [place] [address]          Radiology Studies: No results found.      Scheduled Meds:  apixaban  5 mg Oral BID   Chlorhexidine Gluconate Cloth  6 each Topical Q0600   flecainide  50 mg Oral BID   levothyroxine  100 mcg Oral q morning   metoprolol succinate  75 mg Oral Daily   mupirocin ointment  1 Application Nasal BID   Continuous Infusions:  levofloxacin (LEVAQUIN) IV 750 mg (02/14/22 0116)          Aline August, MD [facility] 02/14/2022, 11:20 AM

## 2022-02-14 NOTE — Evaluation (Signed)
Physical Therapy Evaluation Patient Details Name: [name] MRN: [id number] DOB: 11/09/1927 Today's Date: 02/14/2022  History of Present Illness  88 yo female admitted with confusion, sepsis. Hx of Afib, aortic stenosis, pacemaker, chronic HF  Clinical Impression  On eval, pt required Min A for mobility. She walked ~75 feet with a RW. Pt participated well although she did require some safety cueing during session. Per family report, she is from Ind Living at [facility]. They also report she has been forgetful and unsafe at times. On today, she could not complete mobility tasks without assistance, which is what she would have to be able to do since she lives alone. PT recommendation is for ST SNF rehab. Hopefully, after a short rehab stay pt will be able to make a transition to assisted living.      Recommendations for follow up therapy are one component of a multi-disciplinary discharge planning process, led by the attending physician.  Recommendations may be updated based on patient status, additional functional criteria and insurance authorization.  Follow Up Recommendations Skilled nursing-short term rehab (<3 hours/day) (family hopeful for transition to ALF at some point) Can patient physically be transported by private vehicle: Yes    Assistance Recommended at Discharge Intermittent Supervision/Assistance  Patient can return home with the following  A little help with walking and/or transfers;A little help with bathing/dressing/bathroom;Assistance with cooking/housework;Assist for transportation;Help with stairs or ramp for entrance    Equipment Recommendations None recommended by PT  Recommendations for Other Services       Functional Status Assessment Patient has had a recent decline in their functional status and demonstrates the ability to make significant improvements in function in a reasonable and predictable amount of time.     Precautions / Restrictions  Precautions Precautions: Fall Restrictions Weight Bearing Restrictions: No      Mobility  Bed Mobility Overal bed mobility: Needs Assistance Bed Mobility: Sit to Supine       Sit to supine: Min assist   General bed mobility comments: Assist for LEs onto bed. Increased time.    Transfers Overall transfer level: Needs assistance Equipment used: Rolling walker (2 wheels) Transfers: Sit to/from Stand Sit to Stand: Min assist           General transfer comment: Small amount of assist to rise to standing x 2 (once from recliner, once from toilet). Cues for safety, hand placement.    Ambulation/Gait Ambulation/Gait assistance: Min guard Gait Distance (Feet): 75 Feet Assistive device: Rolling walker (2 wheels) Gait Pattern/deviations: Step-through pattern, Decreased stride length, Trunk flexed       General Gait Details: Min guard for safety. Slow but steady with RW. Pt tolerated distance well  Stairs            Wheelchair Mobility    Modified Rankin (Stroke Patients Only)       Balance Overall balance assessment: Needs assistance   Sitting balance-Leahy Scale: Good       Standing balance-Leahy Scale: Fair                               Pertinent Vitals/Pain Pain Assessment Pain Assessment: Faces Faces Pain Scale: Hurts a little bit Pain Location: L LE Pain Descriptors / Indicators: Discomfort, Sore Pain Intervention(s): Monitored during session    Home Living Family/patient expects to be discharged to:: Unsure Living Arrangements: Alone   Type of Home: Independent living facility Home Access: Level entry  Home Layout: One level Home Equipment: Rollator (4 wheels)      Prior Function Prior Level of Function : Independent/Modified Independent             Mobility Comments: uses rollator for ambulation ADLs Comments: family reports pt gets in/out of shower 1x/week     Hand Dominance        Extremity/Trunk  Assessment   Upper Extremity Assessment Upper Extremity Assessment: Defer to OT evaluation    Lower Extremity Assessment Lower Extremity Assessment: Generalized weakness    Cervical / Trunk Assessment Cervical / Trunk Assessment: Normal  Communication   Communication: No difficulties  Cognition Arousal/Alertness: Awake/alert Behavior During Therapy: WFL for tasks assessed/performed Overall Cognitive Status: History of cognitive impairments - at baseline                                 General Comments: family reports pt has been forgetful prior to this admission; some cueing for safety required on today        General Comments      Exercises     Assessment/Plan    PT Assessment Patient needs continued PT services  PT Problem List Decreased strength;Decreased mobility;Decreased activity tolerance;Decreased balance;Decreased knowledge of use of DME       PT Treatment Interventions DME instruction;Gait training;Therapeutic activities;Therapeutic exercise;Patient/family education;Balance training;Functional mobility training    PT Goals (Current goals can be found in the Care Plan section)  Acute Rehab PT Goals Patient Stated Goal: none stated by patient. family would like for pt to receive more help-they no longer feel ind living is safe for pt PT Goal Formulation: With family Time For Goal Achievement: 02/28/22 Potential to Achieve Goals: Good    Frequency Min 3X/week     Co-evaluation               AM-PAC PT "6 Clicks" Mobility  Outcome Measure Help needed turning from your back to your side while in a flat bed without using bedrails?: A Little Help needed moving from lying on your back to sitting on the side of a flat bed without using bedrails?: A Little Help needed moving to and from a bed to a chair (including a wheelchair)?: A Little Help needed standing up from a chair using your arms (e.g., wheelchair or bedside chair)?: A Little Help  needed to walk in hospital room?: A Little Help needed climbing 3-5 steps with a railing? : A Little 6 Click Score: 18    End of Session Equipment Utilized During Treatment: Gait belt Activity Tolerance: Patient tolerated treatment well Patient left: in bed;with call bell/phone within reach;with family/visitor present   PT Visit Diagnosis: Muscle weakness (generalized) (M62.81)    Time: 4656-8127 PT Time Calculation (min) (ACUTE ONLY): 18 min   Charges:   PT Evaluation $PT Eval Low Complexity: [place], PT Acute Rehabilitation  Office: [phone] Pager: [phone]

## 2022-02-15 DIAGNOSIS — Z7189 Other specified counseling: Secondary | ICD-10-CM

## 2022-02-15 DIAGNOSIS — R531 Weakness: Secondary | ICD-10-CM

## 2022-02-15 DIAGNOSIS — I480 Paroxysmal atrial fibrillation: Secondary | ICD-10-CM | POA: Diagnosis not present

## 2022-02-15 DIAGNOSIS — L03116 Cellulitis of left lower limb: Secondary | ICD-10-CM

## 2022-02-15 DIAGNOSIS — A419 Sepsis, unspecified organism: Secondary | ICD-10-CM | POA: Diagnosis not present

## 2022-02-15 DIAGNOSIS — I10 Essential (primary) hypertension: Secondary | ICD-10-CM | POA: Diagnosis not present

## 2022-02-15 DIAGNOSIS — Z515 Encounter for palliative care: Secondary | ICD-10-CM

## 2022-02-15 LAB — CBC WITH DIFFERENTIAL/PLATELET
Abs Immature Granulocytes: 0.16 10*3/uL — ABNORMAL HIGH (ref 0.00–0.07)
Basophils Absolute: 0.1 10*3/uL (ref 0.0–0.1)
Basophils Relative: 1 %
Eosinophils Absolute: 0.5 10*3/uL (ref 0.0–0.5)
Eosinophils Relative: 7 %
HCT: 36.4 % (ref 36.0–46.0)
Hemoglobin: 11.9 g/dL — ABNORMAL LOW (ref 12.0–15.0)
Immature Granulocytes: 2 %
Lymphocytes Relative: 17 %
Lymphs Abs: 1.2 10*3/uL (ref 0.7–4.0)
MCH: 31.9 pg (ref 26.0–34.0)
MCHC: 32.7 g/dL (ref 30.0–36.0)
MCV: 97.6 fL (ref 80.0–100.0)
Monocytes Absolute: 0.8 10*3/uL (ref 0.1–1.0)
Monocytes Relative: 11 %
Neutro Abs: 4.1 10*3/uL (ref 1.7–7.7)
Neutrophils Relative %: 62 %
Platelets: 110 10*3/uL — ABNORMAL LOW (ref 150–400)
RBC: 3.73 MIL/uL — ABNORMAL LOW (ref 3.87–5.11)
RDW: 13.2 % (ref 11.5–15.5)
WBC: 6.7 10*3/uL (ref 4.0–10.5)
nRBC: 0 % (ref 0.0–0.2)

## 2022-02-15 LAB — BASIC METABOLIC PANEL
Anion gap: 7 (ref 5–15)
BUN: 15 mg/dL (ref 8–23)
CO2: 25 mmol/L (ref 22–32)
Calcium: 8.3 mg/dL — ABNORMAL LOW (ref 8.9–10.3)
Chloride: 109 mmol/L (ref 98–111)
Creatinine, Ser: 0.71 mg/dL (ref 0.44–1.00)
GFR, Estimated: >60 mL/min (ref >60)
Glucose, Bld: 100 mg/dL — ABNORMAL HIGH (ref 70–99)
Potassium: 3.4 mmol/L — ABNORMAL LOW (ref 3.5–5.1)
Sodium: 141 mmol/L (ref 135–145)

## 2022-02-15 LAB — MAGNESIUM: Magnesium: 2.2 mg/dL (ref 1.7–2.4)

## 2022-02-15 LAB — C-REACTIVE PROTEIN: CRP: 8.7 mg/dL — ABNORMAL HIGH (ref <1.0)

## 2022-02-15 MED ORDER — FUROSEMIDE 20 MG PO TABS
20.0000 mg | ORAL_TABLET | Freq: Every day | ORAL | Status: DC
  Administered 2022-02-15 – 2022-02-16 (×2): 20 mg via ORAL
  Filled 2022-02-15 (×2): qty 1

## 2022-02-15 MED ORDER — BENZOCAINE 10 % MT GEL
Freq: Four times a day (QID) | OROMUCOSAL | Status: DC | PRN
  Administered 2022-02-15: 1 via OROMUCOSAL
  Filled 2022-02-15: qty 9.4

## 2022-02-15 MED ORDER — POTASSIUM CHLORIDE CRYS ER 20 MEQ PO TBCR
40.0000 meq | EXTENDED_RELEASE_TABLET | Freq: Once | ORAL | Status: AC
  Administered 2022-02-15: 40 meq via ORAL
  Filled 2022-02-15: qty 2

## 2022-02-15 NOTE — NC FL2 (Signed)
[place] LEVEL OF CARE SCREENING TOOL     IDENTIFICATION  Patient Name: [name] Birthdate: June 16, 1927 Sex: female Admission Date (Current Location): 02/11/2022  [facility] [place] Number:  Herbalist and Address:  [facility],  [place] [place], [place]      Provider Number: [id number]  Attending Physician Name and Address:  Aline August, MD  Relative Name and Phone Number:  Pat Kocher (daughter)    Current Level of Care: SNF Recommended Level of Care: [place] Prior Approval Number:    Date Approved/Denied:   PASRR Number: [id number]  Discharge Plan: SNF    Current Diagnoses: Patient Active Problem List   Diagnosis Date Noted   Left leg cellulitis 02/15/2022   Sepsis ([place]) 02/12/2022   Aortic valve disorder 10/19/2021   Congestive heart failure ([place]) 10/19/2021   Eczema 10/19/2021   Other specified disorders of bone density and structure, other site 10/19/2021   Personal history of colonic polyps 10/19/2021   Pure hypertriglyceridemia 10/19/2021   Urinary incontinence 10/19/2021   Sinus node dysfunction ([place]) 10/19/2021   Esophageal dysphagia 10/19/2021   Secondary hypercoagulable state ([place]) 12/30/2019   Lower extremity edema 01/22/2018   History of total knee replacement, right 10/25/2017   Osteoarthritis of left knee 10/25/2017   Persistent atrial fibrillation ([place])    Hypertension    Hair loss    Aortic stenosis 03/08/2016   Chronic anticoagulation 12/17/2014   Mitral regurgitation 05/03/2014   Edema leg 04/28/2014   GERD (gastroesophageal reflux disease) 04/28/2014   Fatigue 12/26/2011   Ejection fraction    Normal nuclear stress test    Nausea    Brady-tachy syndrome (HCC)    Hypothyroidism    Paroxysmal atrial fibrillation ([place])    Drug therapy    Drug therapy    Pacemaker    Cough    Essential hypertension, benign 07/20/2010    Orientation RESPIRATION BLADDER Height  & Weight     Self, Time, Situation, Place  Normal   Weight: 90.4 kg Height:  '5\' 3"'$  (160 cm)  BEHAVIORAL SYMPTOMS/MOOD NEUROLOGICAL BOWEL NUTRITION STATUS        Diet (Heart healthy, thin fluid, 1500 ml fluid restrictions)  AMBULATORY STATUS COMMUNICATION OF NEEDS Skin   Limited Assist (2 person assist) Verbally Normal                       Personal Care Assistance Level of Assistance  Bathing, Feeding, Dressing Bathing Assistance: Limited assistance Feeding assistance: Independent Dressing Assistance: Limited assistance     Functional Limitations Info  Sight, Hearing, Speech Sight Info: Adequate Hearing Info: Adequate Speech Info: Adequate    SPECIAL CARE FACTORS FREQUENCY  PT (By licensed PT), OT (By licensed OT)     PT Frequency: 5x per week OT Frequency: 5x per week            Contractures Contractures Info: Not present    Additional Factors Info  Code Status, Allergies Code Status Info: DNR Allergies Info: Penicillin- high severity, allergic reaction: swelling and rash           Current Medications (02/15/2022):  This is the current hospital active medication list Current Facility-Administered Medications  Medication Dose Route Frequency Provider Last Rate Last Admin   acetaminophen (TYLENOL) tablet 650 mg  650 mg Oral Q6H PRN Rise Patience, MD   650 mg at 02/14/22 2100   Or   acetaminophen (TYLENOL) suppository  650 mg  650 mg Rectal Q6H PRN Rise Patience, MD       apixaban Arne [place]) tablet 5 mg  5 mg Oral BID Rise Patience, MD   5 mg at 02/15/22 1132   benzocaine (ORAJEL) 10 % mucosal gel   Mouth/Throat QID PRN Aline August, MD   1 Application at 80/99/83 1241   Chlorhexidine Gluconate Cloth 2 % PADS 6 each  6 each Topical Q0600 Debbe Odea, MD   6 each at 02/15/22 0622   flecainide (TAMBOCOR) tablet 50 mg  50 mg Oral BID Rise Patience, MD   50 mg at 02/15/22 1134   furosemide (LASIX) tablet 20 mg  20 mg Oral Daily Aline August, MD   20 mg at 02/15/22 1502   levothyroxine (SYNTHROID) tablet 100 mcg  100 mcg Oral q morning Rise Patience, MD   100 mcg at 02/15/22 3825   linezolid (ZYVOX) tablet 600 mg  600 mg Oral Q12H Alekh, Kshitiz, MD   600 mg at 02/15/22 1133   metoprolol succinate (TOPROL-XL) 24 hr tablet 75 mg  75 mg Oral Daily Rise Patience, MD   75 mg at 02/15/22 1126   mupirocin ointment (BACTROBAN) 2 % 1 Application  1 Application Nasal BID Debbe Odea, MD   1 Application at 05/39/76 1141     Discharge Medications: Please see discharge summary for a list of discharge medications.  Relevant Imaging Results:  Relevant Lab Results:   Additional Information SSN: [id number]  Roseanne Kaufman, RN

## 2022-02-15 NOTE — Progress Notes (Signed)
PROGRESS NOTE    [name] TREVIZO  IRC:[id number] DOB: 03-20-1928 DOA: 02/11/2022 PCP: Aretta Nip, MD   Brief Narrative:  88 year old female with atrial fibrillation, moderate aortic stenosis, hypertension, pacemaker, chronic diastolic heart failure presented with confusion, left lower extremity redness and swelling.  On presentation, she was confused and was found to have leukocytosis and lactic acidosis.  She was started on broad-spectrum antibiotics.  Assessment & Plan:   Severe sepsis: Present on admission Acute metabolic encephalopathy Lactic acidosis Leukocytosis: Resolved Left lower extremity cellulitis Possible UTI: Present on admission -Imaging of left lower extremity on presentation was negative for acute abnormality -Cellulitis improving. -Levaquin has been switched to Zyvox. -Blood cultures negative so far.  Urine culture not available. -Mental status has much improved and possibly close to baseline -Fall precautions.   -COVID/influenza testing negative on presentation  Physical deconditioning -PT recommended SNF placement.  TOC consult.  -Palliative care consultation for goals of care discussion  Hypokalemia -Replace.  Acute thrombocytopenia -Possibly from infection.  Platelets improving to 110 today.  Essential hypertension Chronic diastolic heart failure -Blood pressure intermittently on the high side.  Monitor.  Continue metoprolol.  Lasix on hold.  Possibly resume Lasix on discharge. -Strict input and output.  Daily weights.  Outpatient follow-up with cardiology  AKI -Creatinine 1.25 on presentation.  Baseline creatinine of 0.8-1.  Currently 0.71 today.  Monitor intermittently  -Anemia -Resolved  Hypothyroidism --continue levothyroxine  Paroxysmal A-fib -Currently rate controlled.  Continue apixaban, metoprolol and flecainide   DVT prophylaxis: Apixaban Code Status: Full Family Communication: Daughter at bedside Disposition Plan: Status  is: Inpatient Remains inpatient appropriate because: Of severity of illness    Consultants: palliative care  Procedures: None Antimicrobials:  Anti-infectives (From admission, onward)    Start     Dose/Rate Route Frequency Ordered Stop   02/15/22 1000  linezolid (ZYVOX) tablet 600 mg        600 mg Oral Every 12 hours 02/14/22 1412     02/14/22 2200  levofloxacin (LEVAQUIN) tablet 750 mg  Status:  Discontinued        750 mg Oral Every 48 hours 02/13/22 0815 02/13/22 1542   02/14/22 0100  vancomycin (VANCOCIN) IVPB 1000 mg/200 mL premix  Status:  Discontinued        1,000 mg 200 mL/hr over 60 Minutes Intravenous Every 48 hours 02/12/22 0433 02/12/22 0434   02/14/22 0000  levofloxacin (LEVAQUIN) IVPB 750 mg  Status:  Discontinued        750 mg 100 mL/hr over 90 Minutes Intravenous Every 48 hours 02/12/22 0432 02/13/22 0815   02/14/22 0000  vancomycin (VANCOCIN) IVPB 1000 mg/200 mL premix  Status:  Discontinued        1,000 mg 100 mL/hr over 120 Minutes Intravenous Every 48 hours 02/12/22 0438 02/13/22 0727   02/14/22 0000  levofloxacin (LEVAQUIN) IVPB 750 mg  Status:  Discontinued       Note to Pharmacy: Please don't change to PO yet   750 mg 100 mL/hr over 90 Minutes Intravenous Every 48 hours 02/13/22 1542 02/14/22 1412   02/12/22 0030  vancomycin (VANCOREADY) IVPB 1750 mg/350 mL        1,750 mg 175 mL/hr over 120 Minutes Intravenous  Once 02/12/22 0015 02/12/22 0507   02/12/22 0015  vancomycin (VANCOCIN) IVPB 1000 mg/200 mL premix  Status:  Discontinued        1,000 mg 200 mL/hr over 60 Minutes Intravenous  Once 02/12/22 0011 02/12/22 0015   02/12/22  0015  levofloxacin (LEVAQUIN) IVPB 750 mg        750 mg 100 mL/hr over 90 Minutes Intravenous  Once 02/12/22 0011 02/12/22 0156        Subjective: Patient seen and examined at bedside.  Feels that her left leg swelling and redness are improving but still hurts a little.  Denies any overnight fever, vomiting, shortness of  breath. Objective: Vitals:   02/14/22 1315 02/14/22 2022 02/15/22 0500 02/15/22 0637  BP: (!) 135/56 (!) 160/56  (!) 186/60  Pulse: 60 60  63  Resp: '20 18  20  '$ Temp: 98.2 F (36.8 C) 98.2 F (36.8 C)  98 F (36.7 C)  TempSrc: Oral Oral  Oral  SpO2: 95% 95%  96%  Weight:   90.4 kg   Height:        Intake/Output Summary (Last 24 hours) at 02/15/2022 0756 Last data filed at 02/15/2022 0125 Gross per 24 hour  Intake 730 ml  Output 1450 ml  Net -720 ml    Filed Weights   02/11/22 2340 02/15/22 0500  Weight: 88.5 kg 90.4 kg    Examination:  General: On room air.  No distress.  Elderly female lying in bed. ENT/neck: No thyromegaly.  JVD is not elevated  respiratory: Decreased breath sounds at bases bilaterally with some crackles; no wheezing  CVS: S1-S2 heard, rate controlled currently Abdominal: Soft, nontender, slightly distended; no organomegaly, normal bowel sounds are heard Extremities: lower extremity edema bilaterally present; no cyanosis  CNS: Awake, slow to respond but answers questions appropriately.  No focal neurologic deficit.  Moves extremities Lymph: No obvious lymphadenopathy Skin: Left lower extremity has swelling, erythema, mild tenderness  below the knee up to almost the ankle psych: Flat affect.  Not agitated currently.   Musculoskeletal: No obvious joint swelling/deformity    Data Reviewed: I have personally reviewed following labs and imaging studies  CBC: Recent Labs  Lab 02/11/22 2351 02/12/22 0815 02/13/22 0352 02/14/22 0415 02/14/22 0808 02/15/22 0444  WBC 16.9* 13.2* 11.4* 9.3 9.4 6.7  NEUTROABS 14.7*  --   --   --  7.5 4.1  HGB 13.3 11.6* 12.3 12.1 12.2 11.9*  HCT 41.4 35.7* 37.7 37.3 36.8 36.4  MCV 99.8 99.4 98.4 98.9 97.9 97.6  PLT 113* 90* 80* 97* 95* 110*    Basic Metabolic Panel: Recent Labs  Lab 02/12/22 0815 02/12/22 1624 02/13/22 1609 02/14/22 0808 02/15/22 0444  NA 137 138 139 139 141  K 3.5 3.4* 3.7 3.9 3.4*  CL 106  108 108 109 109  CO2 '23 22 26 26 25  '$ GLUCOSE 116* 119* 99 102* 100*  BUN 32* 32* '23 19 15  '$ CREATININE 1.17* 1.09* 0.90 0.81 0.71  CALCIUM 8.1* 8.6* 8.5* 8.4* 8.3*  MG  --   --   --  2.0 2.2    GFR: Estimated Creatinine Clearance: 47.9 mL/min (by C-G formula based on SCr of 0.71 mg/dL). Liver Function Tests: Recent Labs  Lab 02/11/22 2351  AST 35  ALT 17  ALKPHOS 44  BILITOT 1.1  PROT 6.7  ALBUMIN 3.5    No results for input(s): "LIPASE", "AMYLASE" in the last 168 hours. No results for input(s): "AMMONIA" in the last 168 hours. Coagulation Profile: Recent Labs  Lab 02/11/22 2351  INR 1.3*    Cardiac Enzymes: No results for input(s): "CKTOTAL", "CKMB", "CKMBINDEX", "TROPONINI" in the last 168 hours. BNP (last 3 results) No results for input(s): "PROBNP" in the last 8760 hours. HbA1C:  No results for input(s): "HGBA1C" in the last 72 hours. CBG: No results for input(s): "GLUCAP" in the last 168 hours. Lipid Profile: No results for input(s): "CHOL", "HDL", "LDLCALC", "TRIG", "CHOLHDL", "LDLDIRECT" in the last 72 hours. Thyroid Function Tests: No results for input(s): "TSH", "T4TOTAL", "FREET4", "T3FREE", "THYROIDAB" in the last 72 hours. Anemia Panel: No results for input(s): "VITAMINB12", "FOLATE", "FERRITIN", "TIBC", "IRON", "RETICCTPCT" in the last 72 hours. Sepsis Labs: Recent Labs  Lab 02/11/22 2351 02/12/22 0235 02/12/22 0340 02/12/22 0657  LATICACIDVEN 3.4* >9.0* 3.2* 1.6     Recent Results (from the past 240 hour(s))  Culture, blood (Routine x 2)     Status: None (Preliminary result)   Collection Time: 02/11/22 11:51 PM   Specimen: BLOOD  Result Value Ref Range Status   Specimen Description   Final    BLOOD RIGHT ANTECUBITAL Performed at [facility] [address] [place], [place] [address]    Special Requests   Final    BOTTLES DRAWN AEROBIC AND ANAEROBIC Blood Culture adequate volume Performed at [facility] [address], [place], [place] [address]    Culture   Final    NO GROWTH 3 DAYS Performed at [facility], [place] [address] [place], [place] [address]    Report Status PENDING  Incomplete  Culture, blood (Routine x 2)     Status: None (Preliminary result)   Collection Time: 02/11/22 11:56 PM   Specimen: BLOOD  Result Value Ref Range Status   Specimen Description   Final    BLOOD LEFT ANTECUBITAL Performed at [facility] [address], [place], [place] [address]    Special Requests   Final    BOTTLES DRAWN AEROBIC AND ANAEROBIC Blood Culture results may not be optimal due to an inadequate volume of blood received in culture bottles Performed at [facility] [address] [place], [place] [address]    Culture   Final    NO GROWTH 3 DAYS Performed at [facility], [place] [address] [place], [place] [address]    Report Status PENDING  Incomplete  Resp Panel by RT-PCR (Flu A&B, Covid) Anterior Nasal Swab     Status: None   Collection Time: 02/12/22  1:00 AM   Specimen: Anterior Nasal Swab  Result Value Ref Range Status   SARS Coronavirus 2 by RT PCR NEGATIVE NEGATIVE Final    Comment: (NOTE) SARS-CoV-2 target nucleic acids are NOT DETECTED.  The SARS-CoV-2 RNA is generally detectable in upper respiratory specimens during the acute phase of infection. The lowest concentration of SARS-CoV-2 viral copies this assay can detect is 138 copies/mL. A negative result does not preclude SARS-Cov-2 infection and should not be used as the sole basis for treatment or other patient management decisions. A negative result may occur with  improper specimen collection/handling, submission of specimen other than nasopharyngeal swab, presence of viral mutation(s) within the areas targeted by this assay, and inadequate number of viral copies(<138 copies/mL). A negative result must be combined with clinical observations, patient  history, and epidemiological information. The expected result is Negative.  Fact Sheet for Patients:  EntrepreneurPulse.com.au  Fact Sheet for Healthcare Providers:  IncredibleEmployment.be  This test is no t yet approved or cleared by the [place] FDA and  has been authorized for detection and/or diagnosis of SARS-CoV-2 by FDA under an Emergency Use Authorization (EUA). This EUA will remain  in effect (meaning this test can be used)  for the duration of the COVID-19 declaration under Section 564(b)(1) of the Act, 21 U.S.C.section 360bbb-3(b)(1), unless the authorization is terminated  or revoked sooner.       Influenza A by PCR NEGATIVE NEGATIVE Final   Influenza B by PCR NEGATIVE NEGATIVE Final    Comment: (NOTE) The Xpert Xpress SARS-CoV-2/FLU/RSV plus assay is intended as an aid in the diagnosis of influenza from Nasopharyngeal swab specimens and should not be used as a sole basis for treatment. Nasal washings and aspirates are unacceptable for Xpert Xpress SARS-CoV-2/FLU/RSV testing.  Fact Sheet for Patients: EntrepreneurPulse.com.au  Fact Sheet for Healthcare Providers: IncredibleEmployment.be  This test is not yet approved or cleared by the [place] FDA and has been authorized for detection and/or diagnosis of SARS-CoV-2 by FDA under an Emergency Use Authorization (EUA). This EUA will remain in effect (meaning this test can be used) for the duration of the COVID-19 declaration under Section 564(b)(1) of the Act, 21 U.S.C. section 360bbb-3(b)(1), unless the authorization is terminated or revoked.  Performed at [facility], [place] [address], [place], [place] [address]   MRSA Next Gen by PCR, Nasal     Status: Abnormal   Collection Time: 02/12/22  1:01 PM   Specimen: Nasal Mucosa; Nasal Swab  Result Value Ref Range Status   MRSA by PCR Next Gen DETECTED (A) NOT  DETECTED Final    Comment: CRITICAL RESULT CALLED TO, READ BACK BY AND VERIFIED WITH: HENSON,C AT 1624 ON 02/12/22 BY LUZOLOP (NOTE) The GeneXpert MRSA Assay (FDA approved for NASAL specimens only), is one component of a comprehensive MRSA colonization surveillance program. It is not intended to diagnose MRSA infection nor to guide or monitor treatment for MRSA infections. Test performance is not FDA approved in patients less than 35 years old. Performed at [facility], [place] [address] [place],  [address]          Radiology Studies: No results found.      Scheduled Meds:  apixaban  5 mg Oral BID   Chlorhexidine Gluconate Cloth  6 each Topical Q0600   flecainide  50 mg Oral BID   levothyroxine  100 mcg Oral q morning   linezolid  600 mg Oral Q12H   metoprolol succinate  75 mg Oral Daily   mupirocin ointment  1 Application Nasal BID   Continuous Infusions:          Aline August, MD [facility] 02/15/2022, 7:56 AM

## 2022-02-15 NOTE — TOC Initial Note (Addendum)
Transition of Care [facility]) - Initial/Assessment Note    Patient Details  Name: [name] MRN: [id number] Date of Birth: January 29, 1928  Transition of Care [facility]) CM/SW Contact:    Roseanne Kaufman, RN Phone Number: 02/15/2022, 1:39 PM  Clinical Narrative:  Spoke with patient at bedside, daughter Santiago Glad present. Advised of PT recommendation of SNF placement. Patient wants to receive rehab services at [facility].  Spoke with Mortimer Fries at [facility], who transferred to RN ext [phone] however no answer. Left another voicemail message, awaiting a call back.   - 2:21 pm left voicemail message for Joellen Jersey at [facility], awaiting a call back.                 TOC will continue to follow.  Expected Discharge Plan: Skilled Nursing Facility Barriers to Discharge: Continued Medical Work up   Patient Goals and CMS Choice Patient states their goals for this hospitalization and ongoing recovery are:: to receive rehab services at home CMS Medicare.gov Compare Post Acute Care list provided to:: Patient Choice offered to / list presented to : Patient, Adult Children  Expected Discharge Plan and Services Expected Discharge Plan: [place] In-house Referral: NA Discharge Planning Services: CM Consult Post Acute Care Choice: Durable Medical Equipment (walker( has 2 walkers: standard and a tall walker)) Living arrangements for the past 2 months: [place]                   DME Agency: NA       HH Arranged: NA HH Agency: NA        Prior Living Arrangements/Services Living arrangements for the past 2 months: [place] Lives with:: Self Patient language and need for interpreter reviewed:: Yes Do you feel safe going back to the place where you live?: Yes      Need for Family Participation in Patient Care: Yes (Comment) Care giver support system in place?: Yes (comment)      Activities of Daily Living Home Assistive  Devices/Equipment: Walker (specify type) ADL Screening (condition at time of admission) Patient's cognitive ability adequate to safely complete daily activities?: No Is the patient deaf or have difficulty hearing?: No Does the patient have difficulty seeing, even when wearing glasses/contacts?: Yes Does the patient have difficulty concentrating, remembering, or making decisions?: No Patient able to express need for assistance with ADLs?: No Does the patient have difficulty dressing or bathing?: Yes Independently performs ADLs?: No Does the patient have difficulty walking or climbing stairs?: Yes Weakness of Legs: Both Weakness of Arms/Hands: None  Permission Sought/Granted Permission sought to share information with : Case Manager Permission granted to share information with : Yes, Verbal Permission Granted  Share Information with NAME: Case manager           Emotional Assessment Appearance:: Appears stated age Attitude/Demeanor/Rapport: Gracious Affect (typically observed): Accepting Orientation: : Oriented to Place, Oriented to Self, Oriented to  Time Alcohol / Substance Use: Not Applicable Psych Involvement: No (comment)  Admission diagnosis:  Sepsis [facility]) [A41.9] Patient Active Problem List   Diagnosis Date Noted   Left leg cellulitis 02/15/2022   Sepsis ([place]) 02/12/2022   Aortic valve disorder 10/19/2021   Congestive heart failure ([place]) 10/19/2021   Eczema 10/19/2021   Other specified disorders of bone density and structure, other site 10/19/2021   Personal history of colonic polyps 10/19/2021   Pure hypertriglyceridemia 10/19/2021   Urinary incontinence 10/19/2021   Sinus node dysfunction ([place]) 10/19/2021  Esophageal dysphagia 10/19/2021   Secondary hypercoagulable state ([place]) 12/30/2019   Lower extremity edema 01/22/2018   History of total knee replacement, right 10/25/2017   Osteoarthritis of left knee 10/25/2017   Persistent atrial fibrillation (HCC)     Hypertension    Hair loss    Aortic stenosis 03/08/2016   Chronic anticoagulation 12/17/2014   Mitral regurgitation 05/03/2014   Edema leg 04/28/2014   GERD (gastroesophageal reflux disease) 04/28/2014   Fatigue 12/26/2011   Ejection fraction    Normal nuclear stress test    Nausea    Brady-tachy syndrome (HCC)    Hypothyroidism    Paroxysmal atrial fibrillation [facility])    Drug therapy    Drug therapy    Pacemaker    Cough    Essential hypertension, benign 07/20/2010   PCP:  Aretta Nip, MD Pharmacy:   [id number] [place]Phone: [phone]: [address] [place] [address]Phone: [phone]: [phone] [facility] [address] [facility]Phone: [phone]: [phone]    Social Determinants of Health (SDOH) Interventions    Readmission Risk Interventions     No data to display

## 2022-02-15 NOTE — Progress Notes (Signed)
Mobility Specialist - Progress Note   02/15/22 1010  Mobility  HOB Elevated/Bed Position Self regulated  Activity Ambulated with assistance in hallway  Range of Motion/Exercises Active  Level of Assistance Standby assist, set-up cues, supervision of patient - no hands on  Assistive Device Front wheel walker  Distance Ambulated (ft) 160 ft  Activity Response Tolerated well  Transport method Ambulatory  $Mobility charge 1 Mobility    Pt received in restroom w/ student nurse and agreeable to mobility. No complaints of pain or uncontrollability during ambulation.  Pt to recliner after session with all needs met.     [facility]

## 2022-02-15 NOTE — Progress Notes (Signed)
Patient lost IV access.  MD notified, OK with pt not having IV access.  Angie Fava, RN

## 2022-02-15 NOTE — Consult Note (Signed)
Consultation Note Date: 02/15/2022   Patient Name: [name]  DOB: 06-17-1927  MRN: [id number]  Age / Sex: 88 y.o., female  PCP: Rankins, Bill Salinas, MD Referring Physician: Aline August, MD  Reason for Consultation: Establishing goals of care  HPI/Patient Profile: 88 y.o. female   admitted on 02/11/2022    Clinical Assessment and Goals of Care: 33 year old lady from independent living at a friend's home where she has been for the past 13 years, admitted with left lower extremity cellulitis.  Placed on antibiotics.  Also being treated for possible urinary tract infection.  Has underlying history of atrial fibrillation hypothyroidism and hypertension and chronic diastolic heart failure. PMT consultation for CODE STATUS and goals of care discussions has been requested. Patient is awake alert sitting up in a chair.  Daughter and son-in-law present at the bedside.  Introduced myself and palliative care as follows: Palliative medicine is specialized medical care for people living with serious illness. It focuses on providing relief from the symptoms and stress of a serious illness. The goal is to improve quality of life for both the patient and the family. Goals of care: Broad aims of medical therapy in relation to the patient's values and preferences. Our aim is to provide medical care aimed at enabling patients to achieve the goals that matter most to them, given the circumstances of their particular medical situation and their constraints.   Complains of ongoing pain and discomfort in her left leg.  Daughter states that she was brought into the emergency room in August and was placed on antibiotics, she is being treated for cellulitis left lower extremity.  HCPOA  Daughter Pat Kocher [phone].   SUMMARY OF RECOMMENDATIONS   Goals of care and CODE STATUS discussions: Discussed with patient, also with  daughter Pat Kocher present at the bedside.  Patient has advance care planning documents that have been brought in from her facility, these have been reviewed. Differences between full code versus DNR/DNI discussed.  Patient's daughter endorses DO NOT RESUSCITATE.  She talks about the fact that the patient has been having gradual progressive cognitive decline.  She had a fall a few months ago as well.  Patient enjoys playing the piano.  We talked about physical therapy recommendations for skilled nursing facility initially as a rehabilitation attempt.  After that, patient will likely require skilled level of care.  I recommend ongoing outpatient palliative follow-up. Thank you for the consult.  Code Status/Advance Care Planning: DNR   Symptom Management:     Palliative Prophylaxis:  Delirium Protocol  Psycho-social/Spiritual:  Desire for further Chaplaincy support:yes Additional Recommendations: Caregiving  Support/Resources  Prognosis:  Unable to determine  Discharge Planning: [place] for rehab with Palliative care service follow-up      Primary Diagnoses: Present on Admission:  Sepsis [facility])  Essential hypertension, benign  Hypothyroidism  Paroxysmal atrial fibrillation ([place])   I have reviewed the medical record, interviewed the patient and family, and examined the patient. The following aspects are pertinent.  Past Medical History:  Diagnosis Date   Aortic stenosis 03/08/2016   Brady-tachy syndrome ([place])    a. Biotronik dual chamber PPM implanted 2009 b. gen change to STJ dual chamber PPM 2017   Ejection fraction    EF 70%, echo, October, 2009  //   EF 55-60%, septal dyssynergy consistent with a paced rhythm, mild to moderate mitral regurgitation, echo, November, 2015    GERD (gastroesophageal reflux disease) 04/28/2014   Episodes November, 2015 with fluid refluxing from her esophagus.    Hair loss    Patient questioned Coumadin, changed toPradaxa    Hypertension    Hypothyroidism    Lower extremity edema 01/22/2018   Mitral regurgitation 05/03/2014   Mild-to-moderate mitral regurgitation, echo, November, 2015    Osteoarthritis of left knee 10/25/2017   Paroxysmal atrial fibrillation (HCC)    Episodes rapid atrial fib noted by pacemaker interrogation, August, 2011, diltiazem added, patient improved. Patient continues on Rythmol  //   Changed to flecainide 2013  //   flecainide level checked in 2013, good level    Persistent atrial fibrillation ([place])    Social History   Socioeconomic History   Marital status: Widowed    Spouse name: Not on file   Number of children: Not on file   Years of education: Not on file   Highest education level: Not on file  Occupational History   Not on file  Tobacco Use   Smoking status: Former    Types: Cigarettes    Quit date: 09/06/1990    Years since quitting: 31.4   Smokeless tobacco: Never  Vaping Use   Vaping Use: Never used  Substance and Sexual Activity   Alcohol use: No   Drug use: No   Sexual activity: Not on file  Other Topics Concern   Not on file  Social History Narrative   Not on file   Social Determinants of Health   Financial Resource Strain: Not on file  Food Insecurity: No Food Insecurity (02/15/2022)   Hunger Vital Sign    Worried About Running Out of Food in the Last Year: Never true    Ran Out of Food in the Last Year: Never true  Transportation Needs: No Transportation Needs (02/15/2022)   PRAPARE - Hydrologist (Medical): No    Lack of Transportation (Non-Medical): No  Physical Activity: Not on file  Stress: Not on file  Social Connections: Not on file   Family History  Problem Relation Age of Onset   Heart disease Father    Coronary artery disease Other    Heart disease Son    Scheduled Meds:  apixaban  5 mg Oral BID   Chlorhexidine Gluconate Cloth  6 each Topical Q0600   flecainide  50 mg Oral BID   furosemide  20 mg Oral Daily    levothyroxine  100 mcg Oral q morning   linezolid  600 mg Oral Q12H   metoprolol succinate  75 mg Oral Daily   mupirocin ointment  1 Application Nasal BID   Continuous Infusions: PRN Meds:.acetaminophen **OR** acetaminophen, benzocaine Medications Prior to Admission:  Prior to Admission medications   Medication Sig Start Date End Date Taking? Authorizing Provider  apixaban (ELIQUIS) 5 MG TABS tablet Take 1 tablet (5 mg total) by mouth 2 (two) times daily. 12/11/21  Yes Allred, Jeneen Rinks, MD  Apoaequorin (PREVAGEN PO) Take 1 capsule by mouth daily.   Yes [provider]  Calcium Carbonate-Vit D-Min (CALCIUM 600+D  PLUS MINERALS) 600-400 MG-UNIT TABS Take 1 tablet by mouth daily.   Yes [provider]  flecainide (TAMBOCOR) 50 MG tablet Take 1 tablet (50 mg total) by mouth 2 (two) times daily. 12/18/21  Yes Freada Bergeron, MD  furosemide (LASIX) 20 MG tablet Take 1 tablet (20 mg total) by mouth daily. 12/20/20  Yes Freada Bergeron, MD  levothyroxine (SYNTHROID) 100 MCG tablet Take 100 mcg by mouth every morning. 12/13/21  Yes [provider]  lidocaine (LIDODERM) 5 % Place 1 patch onto the skin daily as needed. Remove & Discard patch within 12 hours or as directed by MD Patient taking differently: Place 1 patch onto the skin daily as needed (for knee pain). Remove & Discard patch within 12 hours or as directed by MD 01/22/22  Yes Jeanell Sparrow, DO  methocarbamol (ROBAXIN) 500 MG tablet Take 500 mg by mouth 2 (two) times daily.   Yes [provider]  metoprolol succinate (TOPROL-XL) 25 MG 24 hr tablet Take 3 tablets (75 mg total) by mouth daily. 12/18/21  Yes Freada Bergeron, MD  Multiple Vitamins-Minerals (OCUVITE PO) Take 1 tablet by mouth daily.   Yes [provider]  potassium chloride (KLOR-CON M) 10 MEQ tablet Take 1 tablet (10 mEq total) by mouth daily. 12/18/21  Yes Freada Bergeron, MD  acetaminophen (TYLENOL) 325 MG tablet Take 2  tablets (650 mg total) by mouth every 6 (six) hours as needed. 01/22/22   Jeanell Sparrow, DO  acetaminophen (TYLENOL) 500 MG tablet Take 1,000 mg by mouth daily.    [provider]    Review of Systems Complains of pain and tenderness in her left leg.  Physical Exam Elderly female sitting up in a chair Diminished breath sounds towards bases S1-S2 Left lower extremity swelling erythema tenderness from below the knee to the ankle, warm to touch. Has evidence of cognitive decline, answers questions appropriately however keeps repeating herself.  Vital Signs: BP (!) 180/66 (BP Location: Left Arm) Comment: MD notified  Pulse 65   Temp 97.7 F (36.5 C) (Oral)   Resp 20   Ht '5\' 3"'$  (1.6 m)   Wt 90.4 kg   SpO2 95%   BMI 35.30 kg/m  Pain Scale: 0-10   Pain Score: 0-No pain   SpO2: SpO2: 95 % O2 Device:SpO2: 95 % O2 Flow Rate: .O2 Flow Rate (L/min): 1 L/min  IO: Intake/output summary:  Intake/Output Summary (Last 24 hours) at 02/15/2022 1512 Last data filed at 02/15/2022 0954 Gross per 24 hour  Intake 250 ml  Output 1450 ml  Net -1200 ml    LBM: Last BM Date : 02/14/22 Baseline Weight: Weight: 88.5 kg Most recent weight: Weight: 90.4 kg     Palliative Assessment/Data:     Palliative performance scale is 50%.  Time In: 1410 Time Out: 1510 Time Total: 60 Greater than 50%  of this time was spent counseling and coordinating care related to the above assessment and plan.  Signed by: Loistine Chance, MD   Please contact Palliative Medicine Team phone at [phone] for questions and concerns.  For individual provider: See Shea Evans

## 2022-02-15 NOTE — Care Management Important Message (Signed)
Important Message  Patient Details IM Letter given to the Patient. Name: [name] MRN: [id number] Date of Birth: Sep 25, 1927   Medicare Important Message Given:  Yes     Kerin Salen 02/15/2022, 11:08 AM

## 2022-02-15 NOTE — Progress Notes (Signed)
Cardiac monitoring discontinued per MD order.  Angie Fava, RN

## 2022-02-16 DIAGNOSIS — R1312 Dysphagia, oropharyngeal phase: Secondary | ICD-10-CM | POA: Diagnosis not present

## 2022-02-16 DIAGNOSIS — E876 Hypokalemia: Secondary | ICD-10-CM | POA: Diagnosis not present

## 2022-02-16 DIAGNOSIS — D72829 Elevated white blood cell count, unspecified: Secondary | ICD-10-CM | POA: Diagnosis not present

## 2022-02-16 DIAGNOSIS — I480 Paroxysmal atrial fibrillation: Secondary | ICD-10-CM | POA: Diagnosis not present

## 2022-02-16 DIAGNOSIS — E781 Pure hyperglyceridemia: Secondary | ICD-10-CM | POA: Diagnosis not present

## 2022-02-16 DIAGNOSIS — I10 Essential (primary) hypertension: Secondary | ICD-10-CM | POA: Diagnosis not present

## 2022-02-16 DIAGNOSIS — R2689 Other abnormalities of gait and mobility: Secondary | ICD-10-CM | POA: Diagnosis not present

## 2022-02-16 DIAGNOSIS — M6281 Muscle weakness (generalized): Secondary | ICD-10-CM | POA: Diagnosis not present

## 2022-02-16 DIAGNOSIS — I6789 Other cerebrovascular disease: Secondary | ICD-10-CM | POA: Diagnosis not present

## 2022-02-16 DIAGNOSIS — Z515 Encounter for palliative care: Secondary | ICD-10-CM | POA: Diagnosis not present

## 2022-02-16 DIAGNOSIS — E039 Hypothyroidism, unspecified: Secondary | ICD-10-CM | POA: Diagnosis not present

## 2022-02-16 DIAGNOSIS — E8841 MELAS syndrome: Secondary | ICD-10-CM | POA: Diagnosis not present

## 2022-02-16 DIAGNOSIS — N1831 Chronic kidney disease, stage 3a: Secondary | ICD-10-CM | POA: Diagnosis not present

## 2022-02-16 DIAGNOSIS — L03116 Cellulitis of left lower limb: Secondary | ICD-10-CM | POA: Diagnosis not present

## 2022-02-16 DIAGNOSIS — I495 Sick sinus syndrome: Secondary | ICD-10-CM | POA: Diagnosis not present

## 2022-02-16 DIAGNOSIS — I4819 Other persistent atrial fibrillation: Secondary | ICD-10-CM | POA: Diagnosis not present

## 2022-02-16 DIAGNOSIS — A419 Sepsis, unspecified organism: Secondary | ICD-10-CM | POA: Diagnosis not present

## 2022-02-16 DIAGNOSIS — I350 Nonrheumatic aortic (valve) stenosis: Secondary | ICD-10-CM | POA: Diagnosis not present

## 2022-02-16 DIAGNOSIS — Z7189 Other specified counseling: Secondary | ICD-10-CM | POA: Diagnosis not present

## 2022-02-16 DIAGNOSIS — G9341 Metabolic encephalopathy: Secondary | ICD-10-CM | POA: Diagnosis not present

## 2022-02-16 DIAGNOSIS — R1319 Other dysphagia: Secondary | ICD-10-CM | POA: Diagnosis not present

## 2022-02-16 DIAGNOSIS — M1712 Unilateral primary osteoarthritis, left knee: Secondary | ICD-10-CM | POA: Diagnosis not present

## 2022-02-16 DIAGNOSIS — I509 Heart failure, unspecified: Secondary | ICD-10-CM | POA: Diagnosis not present

## 2022-02-16 DIAGNOSIS — R531 Weakness: Secondary | ICD-10-CM | POA: Diagnosis not present

## 2022-02-16 LAB — LACTIC ACID, PLASMA: Lactic Acid, Venous: >9 mmol/L (ref 0.5–1.9)

## 2022-02-16 MED ORDER — LIDOCAINE 5 % EX PTCH: 1.0000 | MEDICATED_PATCH | Freq: Every day | CUTANEOUS | Status: DC | PRN

## 2022-02-16 MED ORDER — LINEZOLID 600 MG PO TABS: 600.0000 mg | ORAL_TABLET | Freq: Two times a day (BID) | ORAL | 0 refills | Status: AC

## 2022-02-16 NOTE — Progress Notes (Addendum)
[facility]) Hospital Liaison note:  Notified via [place] from Dr. Aline August of request for [place] services. Will continue to follow for disposition.  Please call with any outpatient palliative questions or concerns.  Thank you for the opportunity to participate in this patient's care.  Thank you, Lorelee Market, LPN [facility] Liaison [phone]

## 2022-02-16 NOTE — TOC Progression Note (Addendum)
Transition of Care [facility]) - Progression Note    Patient Details  Name: [name] MRN: [id number] Date of Birth: 12/19/1927  Transition [facility]) CM/SW [place], RN Phone Number: 02/16/2022, 9:57 AM  Clinical Narrative:   Damaris Schooner with Ermalinda Barrios at [facility], who advised Joellen Jersey is assigned and will follow up with this RNCM [id number] beds. FL2 completed on 02/15/22. Awaiting bed availability.  TOC will continue to follow.  - 10:20am Spoke with patient and daughter Kennyth Lose to advise there's an available bed at [facility] today. Daughter Kennyth Lose will transport the patient via private transportation based on PT recommendation. This RNCM advised patient's daughter Kennyth Lose, that patient will go to [place] building, [facility] will have a wheelchair available to assist.  Patient will be in room 23, report can be called to [phone]. MD, RN notified.   TOC will continue to follow.   Expected Discharge Plan: [place] Barriers to Discharge: Continued Medical Work up  Expected Discharge Plan and Services Expected Discharge Plan: [place] In-house Referral: NA Discharge Planning Services: CM Consult Post Acute Care Choice: Durable Medical Equipment (walker( has 2 walkers: standard and a tall walker)) Living arrangements for the past 2 months: Assisted Living Facility                 DME Arranged: N/A DME Agency: NA       HH Arranged: NA HH Agency: NA         Social Determinants of Health (SDOH) Interventions    Readmission Risk Interventions     No data to display

## 2022-02-16 NOTE — Discharge Summary (Signed)
Physician Discharge Summary  [name] JTT:[id number] DOB: [birth year] DOA: 02/11/2022  PCP: Aretta Nip, MD  Admit date: 02/11/2022 Discharge date: 02/16/2022  Admitted From: Home Disposition: Home  Recommendations for Outpatient Follow-up:  Follow up with PCP in 1 week with repeat CBC/BMP Outpatient follow-up with palliative care Outpatient follow-up with cardiology Follow up in ED if symptoms worsen or new appear   Home Health: No Equipment/Devices: None  Discharge Condition: Stable CODE STATUS: DNR Diet recommendation: Heart healthy  Brief/Interim Summary: 88 year old female with atrial fibrillation, moderate aortic stenosis, hypertension, pacemaker, chronic diastolic heart failure presented with confusion, left lower extremity redness and swelling.  On presentation, she was confused and was found to have leukocytosis and lactic acidosis.  She was started on broad-spectrum antibiotics.  During the hospitalization, her cellulitis is gradually improving and currently she is on oral Zyvox.  PT recommended SNF placement.  She will be discharged to SNF once bed is available on oral Zyvox.  Discharge Diagnoses:   Severe sepsis: Present on admission Acute metabolic encephalopathy Lactic acidosis Leukocytosis: Resolved Left lower extremity cellulitis Possible UTI: Present on admission -Imaging of left lower extremity on presentation was negative for acute abnormality -Cellulitis still significant but slowly improving. -Levaquin has been switched to Zyvox. -Blood cultures negative so far.  Urine culture not available. -Mental status has much improved and possibly close to baseline -Fall precautions.   -COVID/influenza testing negative on presentation -Discharge patient to SNF today on Zyvox for 5 more days. -Sepsis has resolved   Physical deconditioning -PT recommended SNF placement.   -Palliative care evaluation appreciated.  Recommend outpatient palliative care  follow-up.  CODE STATUS has been changed to DNR by palliative care team.  Hypokalemia -Replaced during the hospitalization.  No labs today.   Acute thrombocytopenia -Possibly from infection.  Platelets improving to 110 on 02/15/2022.  Essential hypertension Chronic diastolic heart failure -Blood pressure intermittently on the high side.  Continue metoprolol and Lasix.  Outpatient follow-up with cardiology.    AKI -Creatinine 1.25 on presentation.  Baseline creatinine of 0.8-1.  Currently 0.71 on 02/15/2022.  Outpatient follow-up.   Hypothyroidism --continue levothyroxine   Paroxysmal A-fib -Currently rate controlled.  Continue apixaban, metoprolol and flecainide.  Outpatient follow-up with cardiology  Discharge Instructions  Discharge Instructions     Amb Referral to Palliative Care   Complete by: As directed    Diet - low sodium heart healthy   Complete by: As directed    Increase activity slowly   Complete by: As directed         Contact information for follow-up providers     Rankins, Bill Salinas, MD. Schedule an appointment as soon as possible for a visit in 1 week(s).   Specialty: Family Medicine Contact information: [place] [address] [phone]         Thompson Grayer, MD .   Specialty: Cardiology Contact information: [address] [place] [address] [phone]         Freada Bergeron, MD .   Specialties: Cardiology, Radiology Contact information: [address] [address] [phone]              Contact information for after-discharge care     Destination     [facility] .   Service: Skilled Chiropodist information: [place] [phone]  Consultations: Palliative care   Procedures/Studies: US PELVIS (TRANSABDOMINAL ONLY)  Result Date:  02/12/2022 CLINICAL DATA:  88 year old female with a history of hysterectomy. Left adnexa region cyst on left hip CT last month. EXAM: TRANSABDOMINAL ULTRASOUND OF PELVIS TECHNIQUE: Transabdominal ultrasound examination of the pelvis was performed including evaluation of the uterus, ovaries, adnexal regions, and pelvic cul-de-sac. COMPARISON:  Left hip CT 01/22/2022. FINDINGS: Uterus Measurements: Surgically absent. Endometrium Thickness: Surgically absent. Right ovary Measurements: Not identified. Left ovary Measurements: No normal left ovarian parenchyma identified. Vague roughly 4 cm hypoechoic area posterior to the left urinary bladder in the area of CT finding last month (series 1, image 22). No vascular elements are evident on Doppler. There is pronounced increased through transmission. Other findings:  No pelvic free fluid. IMPRESSION: 1. Roughly 4 cm cystic area in the left hemipelvis posterior to the urinary bladder corresponding to CT finding last month. This demonstrates no evidence of vascular elements, and is probably a simple cyst, but visualization is somewhat limited. As there is Not superior visualization today, recommend 6-12 month follow-up Ultrasound to assess growth. This recommendation follows the consensus statement: 2019 SRU Consensus Conference Update on Follow-up and Reporting. Radiology 2019; [id number]. 2. Absent uterus.  Right ovary not identified. Electronically Signed   By: Genevie Ann M.D.   On: 02/12/2022 08:31   CT Head Wo Contrast  Result Date: 02/12/2022 CLINICAL DATA:  Altered mental status. EXAM: CT HEAD WITHOUT CONTRAST TECHNIQUE: Contiguous axial images were obtained from the base of the skull through the vertex without intravenous contrast. RADIATION DOSE REDUCTION: This exam was performed according to the departmental dose-optimization program which includes automated exposure control, adjustment of the mA and/or kV according to patient size and/or use of iterative  reconstruction technique. COMPARISON:  Head CT dated 01/22/2022. FINDINGS: Brain: Mild age-related atrophy and chronic microvascular ischemic changes. There is no acute intracranial hemorrhage. No mass effect or midline shift no extra-axial fluid collection. Vascular: No hyperdense vessel or unexpected calcification. Skull: Normal. Negative for fracture or focal lesion. Sinuses/Orbits: No acute finding. Other: None IMPRESSION: 1. No acute intracranial pathology. 2. Mild age-related atrophy and chronic microvascular ischemic changes. Electronically Signed   By: Anner Crete M.D.   On: 02/12/2022 01:15   DG Chest 2 View  Result Date: 02/12/2022 CLINICAL DATA:  Suspected sepsis. Nonproductive cough for 1 week, weakness. EXAM: CHEST - 2 VIEW COMPARISON:  01/05/2021, 12/20/2019. FINDINGS: Heart is enlarged the mediastinal contour is stable. Atherosclerotic calcification of the aorta is noted. Strandy opacities are present at the left lung base and there is blunting of the left costophrenic angle. Mild airspace disease is noted in the right upper lobe. No pneumothorax. Degenerative changes are noted in the thoracic spine. Stable compression deformities are present in the mid and lower thoracic spine. IMPRESSION: 1. Mild airspace disease in the right upper lobe and left lung base, possible developing pneumonia. 2. Blunting of the left costophrenic angle, possible small pleural effusion. Electronically Signed   By: Brett Fairy M.D.   On: 02/12/2022 00:30   CT Knee Left Wo Contrast  Result Date: 01/22/2022 CLINICAL DATA:  Status post trauma. EXAM: CT OF THE LEFT KNEE WITHOUT CONTRAST TECHNIQUE: Multidetector CT imaging of the left knee was performed according to the standard protocol. Multiplanar CT image reconstructions were also generated. RADIATION DOSE REDUCTION: This exam was performed according to the departmental dose-optimization program which includes automated exposure control, adjustment of the mA  and/or kV according to patient size and/or use  of iterative reconstruction technique. COMPARISON:  None Available. FINDINGS: Bones/Joint/Cartilage There is no evidence of acute fracture or dislocation. Medial and lateral marginal osteophyte formation is seen. There is marked severity medial tibiofemoral compartment space narrowing. Moderate severity lateral tibiofemoral and patellofemoral narrowing is also noted. A moderate sized joint effusion is seen. Ligaments Suboptimally assessed by CT. Muscles and Tendons Limited in evaluation in the absence of intravenous contrast and otherwise unremarkable. Soft tissues Moderate to marked severity vascular calcification is seen. IMPRESSION: 1. No evidence of acute osseous abnormality. 2. Moderate severity tricompartmental osteoarthritis, as described above. 3. Moderate sized joint effusion. Electronically Signed   By: Virgina Norfolk M.D.   On: 01/22/2022 04:18   CT Hip Left Wo Contrast  Result Date: 01/22/2022 CLINICAL DATA:  Status post trauma. EXAM: CT OF THE LEFT HIP WITHOUT CONTRAST TECHNIQUE: Multidetector CT imaging of the left hip was performed according to the standard protocol. Multiplanar CT image reconstructions were also generated. RADIATION DOSE REDUCTION: This exam was performed according to the departmental dose-optimization program which includes automated exposure control, adjustment of the mA and/or kV according to patient size and/or use of iterative reconstruction technique. COMPARISON:  None Available. FINDINGS: Bones/Joint/Cartilage There is no evidence of acute fracture or dislocation. Mild degenerative changes are seen in the form of joint space narrowing, acetabular sclerosis and lateral acetabular bony spurring. Ligaments Suboptimally assessed by CT. Muscles and Tendons Limited in evaluation in the absence of intravenous contrast and otherwise unremarkable. Soft tissues Unremarkable. Other Of incidental note is the presence of a 3.3 cm x 3.3  cm x 3.2 cm left adnexal cyst. Numerous noninflamed diverticula are seen within the visualized portion of the sigmoid colon. IMPRESSION: 1. No evidence of acute fracture or dislocation. 2. Mild degenerative changes of the left hip. 3. 3.3 cm x 3.3 cm x 3.2 cm left adnexal cyst. Recommend prompt follow-up with pelvic US. Reference: JACR 2020 Feb;17(2):248-254 4. Sigmoid diverticulosis. Electronically Signed   By: Virgina Norfolk M.D.   On: 01/22/2022 04:12   CT Head Wo Contrast  Result Date: 01/22/2022 CLINICAL DATA:  Recent fall with headaches, initial encounter EXAM: CT HEAD WITHOUT CONTRAST TECHNIQUE: Contiguous axial images were obtained from the base of the skull through the vertex without intravenous contrast. RADIATION DOSE REDUCTION: This exam was performed according to the departmental dose-optimization program which includes automated exposure control, adjustment of the mA and/or kV according to patient size and/or use of iterative reconstruction technique. COMPARISON:  09/25/2020 FINDINGS: Brain: No evidence of acute infarction, hemorrhage, hydrocephalus, extra-axial collection or mass lesion/mass effect. Mild atrophic changes are noted. Chronic white matter ischemic changes are seen as well. Vascular: No hyperdense vessel or unexpected calcification. Skull: Normal. Negative for fracture or focal lesion. Sinuses/Orbits: No acute finding. Other: None. IMPRESSION: Chronic atrophic and ischemic changes without acute abnormality. Electronically Signed   By: Inez Catalina M.D.   On: 01/22/2022 03:54   DG Knee Complete 4 Views Left  Result Date: 01/22/2022 CLINICAL DATA:  Fall with left hip pain. EXAM: LEFT KNEE - COMPLETE 4+ VIEW; LEFT FOOT - COMPLETE 3+ VIEW; DG HIP (WITH OR WITHOUT PELVIS) 2-3V LEFT COMPARISON:  None Available. FINDINGS: Suboptimal image detail due to body habitus and underpenetrated technique. There is osteopenia without evidence of displaced fractures of the AP pelvis and proximal  left femur. There is no widening of the SI joints and pubic symphysis. There is bilateral symmetric degenerative arthrosis of the hips, mild spurring SI joints. Degenerative change visualized lower lumbar  spine. Surrounding soft tissues are unremarkable. There is tubing overlying the upper left thigh. IMPRESSION: Limited exam showing no evidence of displaced fractures. No hip dislocation. Osteopenia and degenerative change. Electronically Signed   By: Telford Nab M.D.   On: 01/22/2022 01:25   DG Foot Complete Left  Result Date: 01/22/2022 CLINICAL DATA:  Fall with left hip pain. EXAM: LEFT KNEE - COMPLETE 4+ VIEW; LEFT FOOT - COMPLETE 3+ VIEW; DG HIP (WITH OR WITHOUT PELVIS) 2-3V LEFT COMPARISON:  None Available. FINDINGS: Suboptimal image detail due to body habitus and underpenetrated technique. There is osteopenia without evidence of displaced fractures of the AP pelvis and proximal left femur. There is no widening of the SI joints and pubic symphysis. There is bilateral symmetric degenerative arthrosis of the hips, mild spurring SI joints. Degenerative change visualized lower lumbar spine. Surrounding soft tissues are unremarkable. There is tubing overlying the upper left thigh. IMPRESSION: Limited exam showing no evidence of displaced fractures. No hip dislocation. Osteopenia and degenerative change. Electronically Signed   By: Telford Nab M.D.   On: 01/22/2022 01:25   DG Hip Unilat W or Wo Pelvis 2-3 Views Left  Result Date: 01/22/2022 CLINICAL DATA:  Fall with left hip pain. EXAM: LEFT KNEE - COMPLETE 4+ VIEW; LEFT FOOT - COMPLETE 3+ VIEW; DG HIP (WITH OR WITHOUT PELVIS) 2-3V LEFT COMPARISON:  None Available. FINDINGS: Suboptimal image detail due to body habitus and underpenetrated technique. There is osteopenia without evidence of displaced fractures of the AP pelvis and proximal left femur. There is no widening of the SI joints and pubic symphysis. There is bilateral symmetric degenerative  arthrosis of the hips, mild spurring SI joints. Degenerative change visualized lower lumbar spine. Surrounding soft tissues are unremarkable. There is tubing overlying the upper left thigh. IMPRESSION: Limited exam showing no evidence of displaced fractures. No hip dislocation. Osteopenia and degenerative change. Electronically Signed   By: Telford Nab M.D.   On: 01/22/2022 01:25      Subjective: Patient seen and examined at bedside.  Complains of lower extremity pain and some back pain but feels okay to be discharged to rehab today.  Daughter at bedside.  No overnight fever, chest pain, shortness of breath reported.  Discharge Exam: Vitals:   02/15/22 1924 02/16/22 0612  BP: (!) 139/44 (!) 182/62  Pulse: 62 60  Resp: 18 18  Temp: 98 F (36.7 C) 98.2 F (36.8 C)  SpO2: 94% 93%    General: Pt is alert, awake, not in acute distress.  Elderly female lying in bed.  Currently on room air.  Slow to respond but answers some questions. Cardiovascular: rate controlled, S1/S2 + Respiratory: bilateral decreased breath sounds at bases Abdominal: Soft, NT, ND, bowel sounds + Extremities: Lower extremity edema present, more on the left side; no cyanosis Skin: Still has some swelling, erythema and mild tenderness in the left lower extremity from mid shin to almost the ankle but improved slightly compared to few days ago    The results of significant diagnostics from this hospitalization (including imaging, microbiology, ancillary and laboratory) are listed below for reference.     Microbiology: Recent Results (from the past 240 hour(s))  Culture, blood (Routine x 2)     Status: None (Preliminary result)   Collection Time: 02/11/22 11:51 PM   Specimen: BLOOD  Result Value Ref Range Status   Specimen Description   Final    BLOOD RIGHT ANTECUBITAL Performed at [facility] [address] [place],  [place] [address]    Special Requests   Final    BOTTLES DRAWN AEROBIC  AND ANAEROBIC Blood Culture adequate volume Performed at [facility] [address] [place], [place] [address]    Culture   Final    NO GROWTH 4 DAYS Performed at [facility], [place] [address] [place], [place] [address]    Report Status PENDING  Incomplete  Culture, blood (Routine x 2)     Status: None (Preliminary result)   Collection Time: 02/11/22 11:56 PM   Specimen: BLOOD  Result Value Ref Range Status   Specimen Description   Final    BLOOD LEFT ANTECUBITAL Performed at [facility] [address] [place], [place] [address]    Special Requests   Final    BOTTLES DRAWN AEROBIC AND ANAEROBIC Blood Culture results may not be optimal due to an inadequate volume of blood received in culture bottles Performed at [facility] [address], [place], [place] [address]    Culture   Final    NO GROWTH 4 DAYS Performed at [facility], [place] [address] [place],  [address]    Report Status PENDING  Incomplete  Resp Panel by RT-PCR (Flu A&B, Covid) Anterior Nasal Swab     Status: None   Collection Time: 02/12/22  1:00 AM   Specimen: Anterior Nasal Swab  Result Value Ref Range Status   SARS Coronavirus 2 by RT PCR NEGATIVE NEGATIVE Final    Comment: (NOTE) SARS-CoV-2 target nucleic acids are NOT DETECTED.  The SARS-CoV-2 RNA is generally detectable in upper respiratory specimens during the acute phase of infection. The lowest concentration of SARS-CoV-2 viral copies this assay can detect is 138 copies/mL. A negative result does not preclude SARS-Cov-2 infection and should not be used as the sole basis for treatment or other patient management decisions. A negative result may occur with  improper specimen collection/handling, submission of specimen other than nasopharyngeal swab, presence of viral mutation(s) within the areas targeted by this assay, and inadequate number of viral copies(<138 copies/mL).  A negative result must be combined with clinical observations, patient history, and epidemiological information. The expected result is Negative.  Fact Sheet for Patients:  EntrepreneurPulse.com.au  Fact Sheet for Healthcare Providers:  IncredibleEmployment.be  This test is no t yet approved or cleared by the [place] FDA and  has been authorized for detection and/or diagnosis of SARS-CoV-2 by FDA under an Emergency Use Authorization (EUA). This EUA will remain  in effect (meaning this test can be used) for the duration of the COVID-19 declaration under Section 564(b)(1) of the Act, 21 U.S.C.section 360bbb-3(b)(1), unless the authorization is terminated  or revoked sooner.       Influenza A by PCR NEGATIVE NEGATIVE Final   Influenza B by PCR NEGATIVE NEGATIVE Final    Comment: (NOTE) The Xpert Xpress SARS-CoV-2/FLU/RSV plus assay is intended as an aid in the diagnosis of influenza from Nasopharyngeal swab specimens and should not be used as a sole basis for treatment. Nasal washings and aspirates are unacceptable for Xpert Xpress SARS-CoV-2/FLU/RSV testing.  Fact Sheet for Patients: EntrepreneurPulse.com.au  Fact Sheet for Healthcare Providers: IncredibleEmployment.be  This test is not yet approved or cleared by the [place] FDA and has been authorized for detection and/or diagnosis of SARS-CoV-2 by FDA under an Emergency Use Authorization (EUA). This EUA will remain in effect (meaning this test can be used) for the duration of the COVID-19 declaration  under Section 564(b)(1) of the Act, 21 U.S.C. section 360bbb-3(b)(1), unless the authorization is terminated or revoked.  Performed at [facility], [place] [address], [place], [place] [address]   MRSA Next Gen by PCR, Nasal     Status: Abnormal   Collection Time: 02/12/22  1:01 PM   Specimen: Nasal Mucosa; Nasal Swab  Result  Value Ref Range Status   MRSA by PCR Next Gen DETECTED (A) NOT DETECTED Final    Comment: CRITICAL RESULT CALLED TO, READ BACK BY AND VERIFIED WITH: HENSON,C AT 1624 ON 02/12/22 BY LUZOLOP (NOTE) The GeneXpert MRSA Assay (FDA approved for NASAL specimens only), is one component of a comprehensive MRSA colonization surveillance program. It is not intended to diagnose MRSA infection nor to guide or monitor treatment for MRSA infections. Test performance is not FDA approved in patients less than 3 years old. Performed at [facility], [place] [address], [place], [place] [address]      Labs: BNP (last 3 results) Recent Labs    09/24/21 1121  BNP 378.5*   Basic Metabolic Panel: Recent Labs  Lab 02/12/22 0815 02/12/22 1624 02/13/22 1609 02/14/22 0808 02/15/22 0444  NA 137 138 139 139 141  K 3.5 3.4* 3.7 3.9 3.4*  CL 106 108 108 109 109  CO2 '23 22 26 26 25  '$ GLUCOSE 116* 119* 99 102* 100*  BUN 32* 32* '23 19 15  '$ CREATININE 1.17* 1.09* 0.90 0.81 0.71  CALCIUM 8.1* 8.6* 8.5* 8.4* 8.3*  MG  --   --   --  2.0 2.2   Liver Function Tests: Recent Labs  Lab 02/11/22 2351  AST 35  ALT 17  ALKPHOS 44  BILITOT 1.1  PROT 6.7  ALBUMIN 3.5   No results for input(s): "LIPASE", "AMYLASE" in the last 168 hours. No results for input(s): "AMMONIA" in the last 168 hours. CBC: Recent Labs  Lab 02/11/22 2351 02/12/22 0815 02/13/22 0352 02/14/22 0415 02/14/22 0808 02/15/22 0444  WBC 16.9* 13.2* 11.4* 9.3 9.4 6.7  NEUTROABS 14.7*  --   --   --  7.5 4.1  HGB 13.3 11.6* 12.3 12.1 12.2 11.9*  HCT 41.4 35.7* 37.7 37.3 36.8 36.4  MCV 99.8 99.4 98.4 98.9 97.9 97.6  PLT 113* 90* 80* 97* 95* 110*   Cardiac Enzymes: No results for input(s): "CKTOTAL", "CKMB", "CKMBINDEX", "TROPONINI" in the last 168 hours. BNP: Invalid input(s): "POCBNP" CBG: No results for input(s): "GLUCAP" in the last 168 hours. D-Dimer No results for input(s): "DDIMER" in the last 72 hours. Hgb  A1c No results for input(s): "HGBA1C" in the last 72 hours. Lipid Profile No results for input(s): "CHOL", "HDL", "LDLCALC", "TRIG", "CHOLHDL", "LDLDIRECT" in the last 72 hours. Thyroid function studies No results for input(s): "TSH", "T4TOTAL", "T3FREE", "THYROIDAB" in the last 72 hours.  Invalid input(s): "FREET3" Anemia work up No results for input(s): "VITAMINB12", "FOLATE", "FERRITIN", "TIBC", "IRON", "RETICCTPCT" in the last 72 hours. Urinalysis    Component Value Date/Time   COLORURINE AMBER (A) 02/11/2022 2351   APPEARANCEUR CLOUDY (A) 02/11/2022 2351   LABSPEC 1.016 02/11/2022 2351   PHURINE 5.0 02/11/2022 2351   GLUCOSEU NEGATIVE 02/11/2022 2351   HGBUR NEGATIVE 02/11/2022 2351   BILIRUBINUR NEGATIVE 02/11/2022 2351   BILIRUBINUR neg 04/26/2012 1343   KETONESUR NEGATIVE 02/11/2022 2351   PROTEINUR 100 (A) 02/11/2022 2351   UROBILINOGEN 1.0 04/26/2012 1343   UROBILINOGEN 0.2 03/20/2011 1540   NITRITE NEGATIVE 02/11/2022 2351   LEUKOCYTESUR MODERATE (A) 02/11/2022  2351   Sepsis Labs Recent Labs  Lab 02/13/22 0352 02/14/22 0415 02/14/22 0808 02/15/22 0444  WBC 11.4* 9.3 9.4 6.7   Microbiology Recent Results (from the past 240 hour(s))  Culture, blood (Routine x 2)     Status: None (Preliminary result)   Collection Time: 02/11/22 11:51 PM   Specimen: BLOOD  Result Value Ref Range Status   Specimen Description   Final    BLOOD RIGHT ANTECUBITAL Performed at [facility] [address] [place], [place] [address]    Special Requests   Final    BOTTLES DRAWN AEROBIC AND ANAEROBIC Blood Culture adequate volume Performed at [facility] [address] [place], [place] [address]    Culture   Final    NO GROWTH 4 DAYS Performed at [facility], [place] [address], [place], [place] [address]    Report Status PENDING  Incomplete  Culture, blood (Routine x 2)     Status: None (Preliminary result)   Collection Time:  02/11/22 11:56 PM   Specimen: BLOOD  Result Value Ref Range Status   Specimen Description   Final    BLOOD LEFT ANTECUBITAL Performed at [facility] [address], [place], [place] [address]    Special Requests   Final    BOTTLES DRAWN AEROBIC AND ANAEROBIC Blood Culture results may not be optimal due to an inadequate volume of blood received in culture bottles Performed at [facility] [address], [place], [place] [address]    Culture   Final    NO GROWTH 4 DAYS Performed at [facility], [place] [address], [place], [place] [address]    Report Status PENDING  Incomplete  Resp Panel by RT-PCR (Flu A&B, Covid) Anterior Nasal Swab     Status: None   Collection Time: 02/12/22  1:00 AM   Specimen: Anterior Nasal Swab  Result Value Ref Range Status   SARS Coronavirus 2 by RT PCR NEGATIVE NEGATIVE Final    Comment: (NOTE) SARS-CoV-2 target nucleic acids are NOT DETECTED.  The SARS-CoV-2 RNA is generally detectable in upper respiratory specimens during the acute phase of infection. The lowest concentration of SARS-CoV-2 viral copies this assay can detect is 138 copies/mL. A negative result does not preclude SARS-Cov-2 infection and should not be used as the sole basis for treatment or other patient management decisions. A negative result may occur with  improper specimen collection/handling, submission of specimen other than nasopharyngeal swab, presence of viral mutation(s) within the areas targeted by this assay, and inadequate number of viral copies(<138 copies/mL). A negative result must be combined with clinical observations, patient history, and epidemiological information. The expected result is Negative.  Fact Sheet for Patients:  EntrepreneurPulse.com.au  Fact Sheet for Healthcare Providers:  IncredibleEmployment.be  This test is no t yet approved or cleared by the [place] FDA and   has been authorized for detection and/or diagnosis of SARS-CoV-2 by FDA under an Emergency Use Authorization (EUA). This EUA will remain  in effect (meaning this test can be used) for the duration of the COVID-19 declaration under Section 564(b)(1) of the Act, 21 U.S.C.section 360bbb-3(b)(1), unless the authorization is terminated  or revoked sooner.       Influenza A by PCR NEGATIVE NEGATIVE Final   Influenza B by PCR NEGATIVE NEGATIVE Final    Comment: (NOTE) The Xpert Xpress SARS-CoV-2/FLU/RSV plus assay is intended as an aid in the diagnosis of influenza from Nasopharyngeal  swab specimens and should not be used as a sole basis for treatment. Nasal washings and aspirates are unacceptable for Xpert Xpress SARS-CoV-2/FLU/RSV testing.  Fact Sheet for Patients: EntrepreneurPulse.com.au  Fact Sheet for Healthcare Providers: IncredibleEmployment.be  This test is not yet approved or cleared by the [place] FDA and has been authorized for detection and/or diagnosis of SARS-CoV-2 by FDA under an Emergency Use Authorization (EUA). This EUA will remain in effect (meaning this test can be used) for the duration of the COVID-19 declaration under Section 564(b)(1) of the Act, 21 U.S.C. section 360bbb-3(b)(1), unless the authorization is terminated or revoked.  Performed at [facility], [place] [address], [place], [place] [address]   MRSA Next Gen by PCR, Nasal     Status: Abnormal   Collection Time: 02/12/22  1:01 PM   Specimen: Nasal Mucosa; Nasal Swab  Result Value Ref Range Status   MRSA by PCR Next Gen DETECTED (A) NOT DETECTED Final    Comment: CRITICAL RESULT CALLED TO, READ BACK BY AND VERIFIED WITH: HENSON,C AT 1624 ON 02/12/22 BY LUZOLOP (NOTE) The GeneXpert MRSA Assay (FDA approved for NASAL specimens only), is one component of a comprehensive MRSA colonization surveillance program. It is not intended to diagnose  MRSA infection nor to guide or monitor treatment for MRSA infections. Test performance is not FDA approved in patients less than 76 years old. Performed at [facility], [place] [address], [place], [place] [address]      Time coordinating discharge: 35 minutes  SIGNED:   Aline August, MD  [facility] 02/16/2022, 10:33 AM

## 2022-02-16 NOTE — Progress Notes (Signed)
Daily Progress Note   Patient Name: [name]       Date: 02/16/2022 DOB: [birth year]  Age: 88 y.o. MRN#: [id number] Attending Physician: Aline August, MD Primary Care Physician: Aretta Nip, MD Admit Date: 02/11/2022  Reason for Consultation/Follow-up: Establishing goals of care  Subjective: Awake alert, resting in bed, daughter at bedside.  Length of Stay: 4  Current Medications: Scheduled Meds:   apixaban  5 mg Oral BID   Chlorhexidine Gluconate Cloth  6 each Topical Q0600   flecainide  50 mg Oral BID   furosemide  20 mg Oral Daily   levothyroxine  100 mcg Oral q morning   linezolid  600 mg Oral Q12H   metoprolol succinate  75 mg Oral Daily   mupirocin ointment  1 Application Nasal BID    Continuous Infusions:   PRN Meds: acetaminophen **OR** acetaminophen, benzocaine  Physical Exam         Awake alert Resting in bed Lower extremity cellulitis-less warm to touch, less erythema as compared to 02-15-2022 Regular work of breathing Answers a few questions appropriately Not noted to be in acute distress  Vital Signs: BP (!) 182/62 (BP Location: Right Arm)   Pulse 60   Temp 98.2 F (36.8 C) (Oral)   Resp 18   Ht '5\' 3"'$  (1.6 m)   Wt 88.4 kg   SpO2 93%   BMI 34.51 kg/m  SpO2: SpO2: 93 % O2 Device: O2 Device: Room Air O2 Flow Rate: O2 Flow Rate (L/min): 1 L/min  Intake/output summary:  Intake/Output Summary (Last 24 hours) at 02/16/2022 1025 Last data filed at 02/16/2022 0650 Gross per 24 hour  Intake 490 ml  Output 1750 ml  Net -1260 ml   LBM: Last BM Date : 02/15/22 Baseline Weight: Weight: 88.5 kg Most recent weight: Weight: 88.4 kg       Palliative Assessment/Data:      Patient Active Problem List   Diagnosis Date Noted   Left leg cellulitis  02/15/2022   Sepsis ([place]) 02/12/2022   Aortic valve disorder 10/19/2021   Congestive heart failure ([place]) 10/19/2021   Eczema 10/19/2021   Other specified disorders of bone density and structure, other site 10/19/2021   Personal history of colonic polyps 10/19/2021   Pure hypertriglyceridemia 10/19/2021   Urinary incontinence 10/19/2021  Sinus node dysfunction (HCC) 10/19/2021   Esophageal dysphagia 10/19/2021   Secondary hypercoagulable state ([place]) 12/30/2019   Lower extremity edema 01/22/2018   History of total knee replacement, right 10/25/2017   Osteoarthritis of left knee 10/25/2017   Persistent atrial fibrillation (HCC)    Hypertension    Hair loss    Aortic stenosis 03/08/2016   Chronic anticoagulation 12/17/2014   Mitral regurgitation 05/03/2014   Edema leg 04/28/2014   GERD (gastroesophageal reflux disease) 04/28/2014   Fatigue 12/26/2011   Ejection fraction    Normal nuclear stress test    Nausea    Brady-tachy syndrome (HCC)    Hypothyroidism    Paroxysmal atrial fibrillation [facility])    Drug therapy    Drug therapy    Pacemaker    Cough    Essential hypertension, benign 07/20/2010    Palliative Care Assessment & Plan   Patient Profile:    Assessment: Left lower extremity cellulitis 88 year old lady with A-fib, moderate aortic stenosis, hypertension, history of pacemaker, chronic diastolic heart failure  Recommendations/Plan: Continue current mode of care Recommend skilled nursing facility rehabilitation attempt with addition of palliative services upon discharge.  No further PMT specific recommendations.  Code Status:    Code Status Orders  (From admission, onward)           Start     Ordered   02/15/22 1527  Do not attempt resuscitation (DNR)  Continuous       Question Answer Comment  In the event of cardiac or respiratory ARREST Do not call a "code blue"   In the event of cardiac or respiratory ARREST Do not perform Intubation, CPR,  defibrillation or ACLS   In the event of cardiac or respiratory ARREST Use medication by any route, position, wound care, and other measures to relive pain and suffering. May use oxygen, suction and manual treatment of airway obstruction as needed for comfort.      02/15/22 1526           Code Status History     Date Active Date Inactive Code Status Order ID Comments User Context   02/12/2022 0408 02/15/2022 1526 Full Code [id number]  Rise Patience, MD ED      Advance Directive Documentation    [facility] Most Recent Value  Type of Advance Directive Living will  Pre-existing out of facility DNR order (yellow form or pink MOST form) Pink MOST/Yellow Form most recent copy in chart - Physician notified to receive inpatient order  "MOST" Form in Place? --       Prognosis:  Unable to determine  Discharge Planning: [facility] for rehab with Palliative care service follow-up  Care plan was discussed with patient and daughter Kennyth Lose present at bedside  Thank you for allowing the Palliative Medicine Team to assist in the care of this patient. Low MDM      Greater than 50%  of this time was spent counseling and coordinating care related to the above assessment and plan.  Loistine Chance, MD  Please contact Palliative Medicine Team phone at [phone] for questions and concerns.

## 2022-02-16 NOTE — Progress Notes (Signed)
AVS given to patient and the pt's daughter and explained at the bedside. Medications and follow up appointments have been explained with pt's daughter and the pt verbalizing understanding.

## 2022-02-17 LAB — CULTURE, BLOOD (ROUTINE X 2)
Culture: NO GROWTH
Culture: NO GROWTH
Special Requests: ADEQUATE

## 2022-02-19 ENCOUNTER — Non-Acute Institutional Stay (SKILLED_NURSING_FACILITY): Payer: Medicare Other | Admitting: Nurse Practitioner

## 2022-02-19 ENCOUNTER — Encounter: Payer: Self-pay | Admitting: Nurse Practitioner

## 2022-02-19 DIAGNOSIS — I10 Essential (primary) hypertension: Secondary | ICD-10-CM

## 2022-02-19 DIAGNOSIS — R1319 Other dysphagia: Secondary | ICD-10-CM | POA: Diagnosis not present

## 2022-02-19 DIAGNOSIS — I509 Heart failure, unspecified: Secondary | ICD-10-CM | POA: Diagnosis not present

## 2022-02-19 DIAGNOSIS — E039 Hypothyroidism, unspecified: Secondary | ICD-10-CM

## 2022-02-19 DIAGNOSIS — E876 Hypokalemia: Secondary | ICD-10-CM

## 2022-02-19 DIAGNOSIS — M1712 Unilateral primary osteoarthritis, left knee: Secondary | ICD-10-CM

## 2022-02-19 DIAGNOSIS — I495 Sick sinus syndrome: Secondary | ICD-10-CM | POA: Diagnosis not present

## 2022-02-19 DIAGNOSIS — L03116 Cellulitis of left lower limb: Secondary | ICD-10-CM

## 2022-02-19 DIAGNOSIS — I6789 Other cerebrovascular disease: Secondary | ICD-10-CM | POA: Diagnosis not present

## 2022-02-19 DIAGNOSIS — E781 Pure hyperglyceridemia: Secondary | ICD-10-CM | POA: Diagnosis not present

## 2022-02-19 NOTE — Progress Notes (Signed)
Location:  [place] Room Number: 23-A Place of Service:  SNF [phone]) Provider:  Joud Pettinato Estelle Grumbles  Rankins, Bill Salinas, MD  Patient Care Team: Aretta Nip, MD as PCP - General (Family Medicine) Thompson Grayer, MD as PCP - Electrophysiology (Cardiology) Freada Bergeron, MD as PCP - Cardiology (Cardiology) Neldon Labella, RN as Case Manager  Extended Emergency Contact Information Primary Emergency Contact: Pat Kocher Address: [address], [place] Phone: [phone] Mobile Phone: [phone] Relation: Daughter Secondary Emergency Contact: Sonny Masters Home Phone: [phone] Mobile Phone: [phone] Relation: Daughter Interpreter needed? No  Code Status:  DNR Goals of care: Advanced Directive information    02/12/2022    8:52 PM  Advanced Directives  Does Patient Have a Medical Advance Directive? Yes  Type of Advance Directive Living will  Does patient want to make changes to medical advance directive? No - Patient declined  Pre-existing out of facility DNR order (yellow form or pink MOST form) Pink MOST/Yellow Form most recent copy in chart - Physician notified to receive inpatient order     Chief Complaint  Patient presents with   Acute Visit    Medication review     HPI:  Pt is a 88 y.o. female seen today for an acute visit for medication review following hospital stay.   Hospitalized 02/11/22-02/16/22 for sepsis, LLE cellulitis and possible UTI(no urine culture available),  fully treated, complete 5 day course of  Zyvox at [facility]. Her mentation has  returned her baseline, wbc down to 6.7 02/15/22, 02/12/22 CXR Mild airspace disease in the right upper lobe and left lung base, possible developing pneumonia, no respiratory symptoms presently.   Afib, on Metoprolol, Flecainide, Eliquis.   AS moderate  HTN on Metoprolol, Furosemide, intermittently elevated Sbp, f/u Cardiology.   Pacemaker,  f/u Cardiology  CHF, mild edema BLE, on Furosemide. EF 70-75% 09/19/21  Cellulitis LLE, fully treated, complete 5 day course of Zyvox in SNF [facility],  remaining mild erythema and warmth  Dysphasia, nectar liquid.   Hypothyroidism, taking Levothyroxine, TSH 2.159 09/24/21  OA, takes Tylenol, Methocarbamol.   CT head 02/12/22 Mild age-related atrophy and chronic microvascular ischemic changes.  Hypokalemia, K 3.4 02/15/22, on Kcl   Past Medical History:  Diagnosis Date   Aortic stenosis 03/08/2016   Brady-tachy syndrome ([place])    a. Biotronik dual chamber PPM implanted 2009 b. gen change to STJ dual chamber PPM 2017   Ejection fraction    EF 70%, echo, October, 2009  //   EF 55-60%, septal dyssynergy consistent with a paced rhythm, mild to moderate mitral regurgitation, echo, November, 2015    GERD (gastroesophageal reflux disease) 04/28/2014   Episodes November, 2015 with fluid refluxing from her esophagus.    Hair loss    Patient questioned Coumadin, changed toPradaxa   Hypertension    Hypothyroidism    Lower extremity edema 01/22/2018   Mitral regurgitation 05/03/2014   Mild-to-moderate mitral regurgitation, echo, November, 2015    Osteoarthritis of left knee 10/25/2017   Paroxysmal atrial fibrillation (HCC)    Episodes rapid atrial fib noted by pacemaker interrogation, August, 2011, diltiazem added, patient improved. Patient continues on Rythmol  //   Changed to flecainide 2013  //   flecainide level checked in 2013, good level    Persistent atrial fibrillation ([place])    Past Surgical History:  Procedure Laterality Date   ABDOMINAL HYSTERECTOMY  EP IMPLANTABLE DEVICE N/A 08/08/2015   Generator change with S[employer] DR PPM by Dr Rayann Heman   JOINT REPLACEMENT     PACEMAKER PLACEMENT  2009          Outpatient Encounter Medications as of 02/19/2022  Medication Sig   acetaminophen (TYLENOL) 325 MG tablet Take 2 tablets (650 mg total) by mouth every 6 (six) hours as needed.   apixaban  (ELIQUIS) 5 MG TABS tablet Take 1 tablet (5 mg total) by mouth 2 (two) times daily.   Apoaequorin (PREVAGEN PO) Take 1 capsule by mouth daily.   Calcium Carbonate-Vit D-Min (CALCIUM 600+D PLUS MINERALS) 600-400 MG-UNIT TABS Take 1 tablet by mouth daily.   flecainide (TAMBOCOR) 50 MG tablet Take 1 tablet (50 mg total) by mouth 2 (two) times daily.   furosemide (LASIX) 20 MG tablet Take 1 tablet (20 mg total) by mouth daily.   levothyroxine (SYNTHROID) 100 MCG tablet Take 100 mcg by mouth every morning.   lidocaine (LIDODERM) 5 % Place 1 patch onto the skin daily as needed (for knee pain). Remove & Discard patch within 12 hours or as directed by MD   linezolid (ZYVOX) 600 MG tablet Take 1 tablet (600 mg total) by mouth every 12 (twelve) hours for 5 days.   methocarbamol (ROBAXIN) 500 MG tablet Take 500 mg by mouth 2 (two) times daily.   metoprolol succinate (TOPROL-XL) 25 MG 24 hr tablet Take 3 tablets (75 mg total) by mouth daily.   Multiple Vitamins-Minerals (OCUVITE PO) Take 1 tablet by mouth daily.   potassium chloride (KLOR-CON M) 10 MEQ tablet Take 1 tablet (10 mEq total) by mouth daily.   No facility-administered encounter medications on file as of 02/19/2022.    Review of Systems  Constitutional:  Negative for activity change, appetite change and fever.  HENT:  Positive for trouble swallowing. Negative for congestion.   Eyes:  Negative for visual disturbance.  Respiratory:  Negative for cough, shortness of breath and wheezing.   Cardiovascular:  Positive for leg swelling. Negative for chest pain and palpitations.  Gastrointestinal:  Negative for abdominal pain, constipation, nausea and vomiting.  Genitourinary:  Negative for dysuria, frequency and urgency.  Musculoskeletal:  Positive for arthralgias and gait problem.  Skin:  Negative for color change.  Neurological:  Negative for speech difficulty, weakness and headaches.  Psychiatric/Behavioral:  Negative for behavioral problems and  sleep disturbance. The patient is not nervous/anxious.     Immunization History  Administered Date(s) Administered   Influenza Split 03/11/2009, 03/11/2010, 02/25/2011, 02/24/2019, 03/08/2020, 03/25/2021   Influenza, High Dose Seasonal PF 03/08/2015, 04/03/2017, 03/17/2018, 03/08/2020   Influenza,inj,Quad PF,6+ Mos 03/08/2016   Influenza,inj,quad, With Preservative 03/11/2017   Moderna Sars-Covid-2 Vaccination 06/15/2019, 07/13/2019, 04/19/2020   Pneumococcal Conjugate-13 12/06/2014   Pneumococcal Polysaccharide-23 05/13/2001, 03/11/2009   Tdap 11/21/2011   Pertinent  Health Maintenance Due  Topic Date Due   INFLUENZA VACCINE  01/09/2022   DEXA SCAN  Completed      02/14/2022   10:00 PM 02/15/2022    9:54 AM 02/15/2022   12:03 PM 02/16/2022   12:00 AM 02/16/2022    9:45 AM  Fall Risk  Patient Fall Risk Level High fall risk High fall risk High fall risk High fall risk High fall risk   Functional Status Survey:    Vitals:   02/19/22 0928  BP: (!) 160/64  Pulse: 61  Resp: 18  Temp: 97.6 F (36.4 C)  SpO2: 94%  Weight: 197 lb 3.2 oz (89.4  kg)  Height: '5\' 3"'$  (1.6 m)   Body mass index is 34.93 kg/m. Physical Exam Vitals and nursing note reviewed.  Constitutional:      Appearance: Normal appearance.  HENT:     Head: Normocephalic and atraumatic.     Nose: Nose normal.     Mouth/Throat:     Mouth: Mucous membranes are moist.  Eyes:     Extraocular Movements: Extraocular movements intact.     Conjunctiva/sclera: Conjunctivae normal.     Pupils: Pupils are equal, round, and reactive to light.  Cardiovascular:     Rate and Rhythm: Normal rate. Rhythm irregular.     Heart sounds: Murmur heard.  Pulmonary:     Effort: Pulmonary effort is normal.     Breath sounds: No rales.  Abdominal:     General: Bowel sounds are normal.     Palpations: Abdomen is soft.     Tenderness: There is no abdominal tenderness.  Musculoskeletal:        General: Normal range of motion.      Cervical back: Normal range of motion and neck supple.     Right lower leg: Edema present.     Left lower leg: Edema present.     Comments: 1+ edema LLE, trace edema RLE  Skin:    General: Skin is warm and dry.     Findings: Erythema present.     Comments: Left lower leg mild erythema  Neurological:     General: No focal deficit present.     Mental Status: She is alert and oriented to person, place, and time. Mental status is at baseline.     Gait: Gait abnormal.  Psychiatric:        Mood and Affect: Mood normal.        Behavior: Behavior normal.        Thought Content: Thought content normal.        Judgment: Judgment normal.     Labs reviewed: Recent Labs    09/24/21 1121 02/11/22 2351 02/13/22 1609 02/14/22 0808 02/15/22 0444  NA 142   < > 139 139 141  K 4.1   < > 3.7 3.9 3.4*  CL 107   < > 108 109 109  CO2 27   < > '26 26 25  '$ GLUCOSE 110*   < > 99 102* 100*  BUN 15   < > '23 19 15  '$ CREATININE 0.93   < > 0.90 0.81 0.71  CALCIUM 9.7   < > 8.5* 8.4* 8.3*  MG 2.0  --   --  2.0 2.2   < > = values in this interval not displayed.   Recent Labs    02/11/22 2351  AST 35  ALT 17  ALKPHOS 44  BILITOT 1.1  PROT 6.7  ALBUMIN 3.5   Recent Labs    02/11/22 2351 02/12/22 0815 02/14/22 0415 02/14/22 0808 02/15/22 0444  WBC 16.9*   < > 9.3 9.4 6.7  NEUTROABS 14.7*  --   --  7.5 4.1  HGB 13.3   < > 12.1 12.2 11.9*  HCT 41.4   < > 37.3 36.8 36.4  MCV 99.8   < > 98.9 97.9 97.6  PLT 113*   < > 97* 95* 110*   < > = values in this interval not displayed.   Lab Results  Component Value Date   TSH 2.159 09/24/2021   No results found for: "HGBA1C" Lab Results  Component Value Date  CHOL 193 05/13/2017   HDL 41 05/13/2017   LDLCALC 122 (H) 05/13/2017   TRIG 152 (H) 05/13/2017   CHOLHDL 4.7 (H) 05/13/2017    Significant Diagnostic Results in last 30 days:  US PELVIS (TRANSABDOMINAL ONLY)  Result Date: 02/12/2022 CLINICAL DATA:  88 year old female with a history  of hysterectomy. Left adnexa region cyst on left hip CT last month. EXAM: TRANSABDOMINAL ULTRASOUND OF PELVIS TECHNIQUE: Transabdominal ultrasound examination of the pelvis was performed including evaluation of the uterus, ovaries, adnexal regions, and pelvic cul-de-sac. COMPARISON:  Left hip CT 01/22/2022. FINDINGS: Uterus Measurements: Surgically absent. Endometrium Thickness: Surgically absent. Right ovary Measurements: Not identified. Left ovary Measurements: No normal left ovarian parenchyma identified. Vague roughly 4 cm hypoechoic area posterior to the left urinary bladder in the area of CT finding last month (series 1, image 22). No vascular elements are evident on Doppler. There is pronounced increased through transmission. Other findings:  No pelvic free fluid. IMPRESSION: 1. Roughly 4 cm cystic area in the left hemipelvis posterior to the urinary bladder corresponding to CT finding last month. This demonstrates no evidence of vascular elements, and is probably a simple cyst, but visualization is somewhat limited. As there is Not superior visualization today, recommend 6-12 month follow-up Ultrasound to assess growth. This recommendation follows the consensus statement: 2019 SRU Consensus Conference Update on Follow-up and Reporting. Radiology 2019; [id number]. 2. Absent uterus.  Right ovary not identified. Electronically Signed   By: Genevie Ann M.D.   On: 02/12/2022 08:31   CT Head Wo Contrast  Result Date: 02/12/2022 CLINICAL DATA:  Altered mental status. EXAM: CT HEAD WITHOUT CONTRAST TECHNIQUE: Contiguous axial images were obtained from the base of the skull through the vertex without intravenous contrast. RADIATION DOSE REDUCTION: This exam was performed according to the departmental dose-optimization program which includes automated exposure control, adjustment of the mA and/or kV according to patient size and/or use of iterative reconstruction technique. COMPARISON:  Head CT dated 01/22/2022.  FINDINGS: Brain: Mild age-related atrophy and chronic microvascular ischemic changes. There is no acute intracranial hemorrhage. No mass effect or midline shift no extra-axial fluid collection. Vascular: No hyperdense vessel or unexpected calcification. Skull: Normal. Negative for fracture or focal lesion. Sinuses/Orbits: No acute finding. Other: None IMPRESSION: 1. No acute intracranial pathology. 2. Mild age-related atrophy and chronic microvascular ischemic changes. Electronically Signed   By: Anner Crete M.D.   On: 02/12/2022 01:15   DG Chest 2 View  Result Date: 02/12/2022 CLINICAL DATA:  Suspected sepsis. Nonproductive cough for 1 week, weakness. EXAM: CHEST - 2 VIEW COMPARISON:  01/05/2021, 12/20/2019. FINDINGS: Heart is enlarged the mediastinal contour is stable. Atherosclerotic calcification of the aorta is noted. Strandy opacities are present at the left lung base and there is blunting of the left costophrenic angle. Mild airspace disease is noted in the right upper lobe. No pneumothorax. Degenerative changes are noted in the thoracic spine. Stable compression deformities are present in the mid and lower thoracic spine. IMPRESSION: 1. Mild airspace disease in the right upper lobe and left lung base, possible developing pneumonia. 2. Blunting of the left costophrenic angle, possible small pleural effusion. Electronically Signed   By: Brett Fairy M.D.   On: 02/12/2022 00:30   CT Knee Left Wo Contrast  Result Date: 01/22/2022 CLINICAL DATA:  Status post trauma. EXAM: CT OF THE LEFT KNEE WITHOUT CONTRAST TECHNIQUE: Multidetector CT imaging of the left knee was performed according to the standard protocol. Multiplanar CT image reconstructions were also generated.  RADIATION DOSE REDUCTION: This exam was performed according to the departmental dose-optimization program which includes automated exposure control, adjustment of the mA and/or kV according to patient size and/or use of iterative  reconstruction technique. COMPARISON:  None Available. FINDINGS: Bones/Joint/Cartilage There is no evidence of acute fracture or dislocation. Medial and lateral marginal osteophyte formation is seen. There is marked severity medial tibiofemoral compartment space narrowing. Moderate severity lateral tibiofemoral and patellofemoral narrowing is also noted. A moderate sized joint effusion is seen. Ligaments Suboptimally assessed by CT. Muscles and Tendons Limited in evaluation in the absence of intravenous contrast and otherwise unremarkable. Soft tissues Moderate to marked severity vascular calcification is seen. IMPRESSION: 1. No evidence of acute osseous abnormality. 2. Moderate severity tricompartmental osteoarthritis, as described above. 3. Moderate sized joint effusion. Electronically Signed   By: Virgina Norfolk M.D.   On: 01/22/2022 04:18   CT Hip Left Wo Contrast  Result Date: 01/22/2022 CLINICAL DATA:  Status post trauma. EXAM: CT OF THE LEFT HIP WITHOUT CONTRAST TECHNIQUE: Multidetector CT imaging of the left hip was performed according to the standard protocol. Multiplanar CT image reconstructions were also generated. RADIATION DOSE REDUCTION: This exam was performed according to the departmental dose-optimization program which includes automated exposure control, adjustment of the mA and/or kV according to patient size and/or use of iterative reconstruction technique. COMPARISON:  None Available. FINDINGS: Bones/Joint/Cartilage There is no evidence of acute fracture or dislocation. Mild degenerative changes are seen in the form of joint space narrowing, acetabular sclerosis and lateral acetabular bony spurring. Ligaments Suboptimally assessed by CT. Muscles and Tendons Limited in evaluation in the absence of intravenous contrast and otherwise unremarkable. Soft tissues Unremarkable. Other Of incidental note is the presence of a 3.3 cm x 3.3 cm x 3.2 cm left adnexal cyst. Numerous noninflamed  diverticula are seen within the visualized portion of the sigmoid colon. IMPRESSION: 1. No evidence of acute fracture or dislocation. 2. Mild degenerative changes of the left hip. 3. 3.3 cm x 3.3 cm x 3.2 cm left adnexal cyst. Recommend prompt follow-up with pelvic US. Reference: JACR 2020 Feb;17(2):248-254 4. Sigmoid diverticulosis. Electronically Signed   By: Virgina Norfolk M.D.   On: 01/22/2022 04:12   CT Head Wo Contrast  Result Date: 01/22/2022 CLINICAL DATA:  Recent fall with headaches, initial encounter EXAM: CT HEAD WITHOUT CONTRAST TECHNIQUE: Contiguous axial images were obtained from the base of the skull through the vertex without intravenous contrast. RADIATION DOSE REDUCTION: This exam was performed according to the departmental dose-optimization program which includes automated exposure control, adjustment of the mA and/or kV according to patient size and/or use of iterative reconstruction technique. COMPARISON:  09/25/2020 FINDINGS: Brain: No evidence of acute infarction, hemorrhage, hydrocephalus, extra-axial collection or mass lesion/mass effect. Mild atrophic changes are noted. Chronic white matter ischemic changes are seen as well. Vascular: No hyperdense vessel or unexpected calcification. Skull: Normal. Negative for fracture or focal lesion. Sinuses/Orbits: No acute finding. Other: None. IMPRESSION: Chronic atrophic and ischemic changes without acute abnormality. Electronically Signed   By: Inez Catalina M.D.   On: 01/22/2022 03:54   DG Knee Complete 4 Views Left  Result Date: 01/22/2022 CLINICAL DATA:  Fall with left hip pain. EXAM: LEFT KNEE - COMPLETE 4+ VIEW; LEFT FOOT - COMPLETE 3+ VIEW; DG HIP (WITH OR WITHOUT PELVIS) 2-3V LEFT COMPARISON:  None Available. FINDINGS: Suboptimal image detail due to body habitus and underpenetrated technique. There is osteopenia without evidence of displaced fractures of the AP pelvis and proximal left  femur. There is no widening of the SI joints  and pubic symphysis. There is bilateral symmetric degenerative arthrosis of the hips, mild spurring SI joints. Degenerative change visualized lower lumbar spine. Surrounding soft tissues are unremarkable. There is tubing overlying the upper left thigh. IMPRESSION: Limited exam showing no evidence of displaced fractures. No hip dislocation. Osteopenia and degenerative change. Electronically Signed   By: Telford Nab M.D.   On: 01/22/2022 01:25   DG Foot Complete Left  Result Date: 01/22/2022 CLINICAL DATA:  Fall with left hip pain. EXAM: LEFT KNEE - COMPLETE 4+ VIEW; LEFT FOOT - COMPLETE 3+ VIEW; DG HIP (WITH OR WITHOUT PELVIS) 2-3V LEFT COMPARISON:  None Available. FINDINGS: Suboptimal image detail due to body habitus and underpenetrated technique. There is osteopenia without evidence of displaced fractures of the AP pelvis and proximal left femur. There is no widening of the SI joints and pubic symphysis. There is bilateral symmetric degenerative arthrosis of the hips, mild spurring SI joints. Degenerative change visualized lower lumbar spine. Surrounding soft tissues are unremarkable. There is tubing overlying the upper left thigh. IMPRESSION: Limited exam showing no evidence of displaced fractures. No hip dislocation. Osteopenia and degenerative change. Electronically Signed   By: Telford Nab M.D.   On: 01/22/2022 01:25   DG Hip Unilat W or Wo Pelvis 2-3 Views Left  Result Date: 01/22/2022 CLINICAL DATA:  Fall with left hip pain. EXAM: LEFT KNEE - COMPLETE 4+ VIEW; LEFT FOOT - COMPLETE 3+ VIEW; DG HIP (WITH OR WITHOUT PELVIS) 2-3V LEFT COMPARISON:  None Available. FINDINGS: Suboptimal image detail due to body habitus and underpenetrated technique. There is osteopenia without evidence of displaced fractures of the AP pelvis and proximal left femur. There is no widening of the SI joints and pubic symphysis. There is bilateral symmetric degenerative arthrosis of the hips, mild spurring SI joints.  Degenerative change visualized lower lumbar spine. Surrounding soft tissues are unremarkable. There is tubing overlying the upper left thigh. IMPRESSION: Limited exam showing no evidence of displaced fractures. No hip dislocation. Osteopenia and degenerative change. Electronically Signed   By: Telford Nab M.D.   On: 01/22/2022 01:25    Assessment/Plan Essential hypertension, benign on Metoprolol, Furosemide, intermittently elevated Sbp, f/u Cardiology.   Brady-tachy syndrome ([place])  f/u Cardiology  Congestive heart failure (HCC) mild edema BLE, on Furosemide. EF 70-75% 09/19/21  Left leg cellulitis Cellulitis LLE, fully treated, complete 5 day course of Zyvox in SNF [facility],  remaining mild erythema and warmth. Update CBC/diff  Esophageal dysphagia nectar liquid.   Hypothyroidism  taking Levothyroxine, TSH 2.159 09/24/21  Osteoarthritis of left knee  takes Tylenol, Methocarbamol.   Cerebral microvascular disease CT head 02/12/22 Mild age-related atrophy and chronic microvascular ischemic Changes. On Elliquis, allergic to statin.   Hypokalemia, inadequate intake K 3.4 02/15/22, on Kcl, update BMP    Pure hypertriglyceridemia Allergic to statin.      Family/ staff Communication: plan of care reviewed with the patient and charge nurse.   Labs/tests ordered:  none  Time spend 35 minutes.

## 2022-02-19 NOTE — Assessment & Plan Note (Signed)
Allergic to statin.

## 2022-02-19 NOTE — Assessment & Plan Note (Signed)
taking Levothyroxine, TSH 2.159 09/24/21 

## 2022-02-19 NOTE — Assessment & Plan Note (Signed)
K 3.4 02/15/22, on Kcl, update BMP

## 2022-02-19 NOTE — Assessment & Plan Note (Signed)
nectar liquid 

## 2022-02-19 NOTE — Assessment & Plan Note (Addendum)
Cellulitis LLE, fully treated, complete 5 day course of Zyvox in SN[facility],  remaining mild erythema and warmth. Update CBC/diff

## 2022-02-19 NOTE — Assessment & Plan Note (Signed)
takes Tylenol, Methocarbamol.  

## 2022-02-19 NOTE — Assessment & Plan Note (Signed)
on Metoprolol, Furosemide, intermittently elevated Sbp, f/u Cardiology.  

## 2022-02-19 NOTE — Assessment & Plan Note (Signed)
mild edema BLE, on Furosemide. EF 70-75% 09/19/21

## 2022-02-19 NOTE — Assessment & Plan Note (Signed)
f/u Cardiology

## 2022-02-19 NOTE — Assessment & Plan Note (Addendum)
CT head 02/12/22 Mild age-related atrophy and chronic microvascular ischemic Changes. On Elliquis, allergic to statin.

## 2022-02-20 ENCOUNTER — Non-Acute Institutional Stay (SKILLED_NURSING_FACILITY): Payer: Medicare Other | Admitting: Family Medicine

## 2022-02-20 ENCOUNTER — Encounter: Payer: Self-pay | Admitting: Family Medicine

## 2022-02-20 DIAGNOSIS — I495 Sick sinus syndrome: Secondary | ICD-10-CM | POA: Diagnosis not present

## 2022-02-20 DIAGNOSIS — I10 Essential (primary) hypertension: Secondary | ICD-10-CM

## 2022-02-20 DIAGNOSIS — I509 Heart failure, unspecified: Secondary | ICD-10-CM | POA: Diagnosis not present

## 2022-02-20 DIAGNOSIS — L03116 Cellulitis of left lower limb: Secondary | ICD-10-CM

## 2022-02-20 NOTE — Progress Notes (Unsigned)
Provider: Lillette Boxer. Kassie Keng,MD  Location:  [facility] Room Number: 23-A Place of Service:  SNF (31)  PCP: Rankins, Bill Salinas, MD Patient Care Team: Rankins, Bill Salinas, MD as PCP - General (Family Medicine) Thompson Grayer, MD as PCP - Electrophysiology (Cardiology) Freada Bergeron, MD as PCP - Cardiology (Cardiology) Neldon Labella, RN as Case Manager  Extended Emergency Contact Information Primary Emergency Contact: Pat Kocher Address: [address], [place] Phone: [phone] Mobile Phone: [phone] Relation: Daughter Secondary Emergency Contact: Sonny Masters Home Phone: [phone] Mobile Phone: [phone] Relation: Daughter Interpreter needed? No  Code Status: DNR Goals of Care: Advanced Directive information    02/20/2022    9:02 AM  Advanced Directives  Does Patient Have a Medical Advance Directive? Yes  Type of Paramedic of [place];Living will;Out of facility DNR (pink MOST or yellow form)  Does patient want to make changes to medical advance directive? No - Patient declined  Copy of [place] in Chart? Yes - validated most recent copy scanned in chart (See row information)      Chief Complaint  Patient presents with   New Admit To SNF    HPI: Patient is a 88 y.o. female seen today for admission to [facility].  She was hospitalized from 02/11/2022 until 02/16/2022 with cellulitis and sepsis.  On presentation to the hospital she was confused and found to have leukocytosis and lactic acidosis.  She was started on broad-spectrum antibiotics.  Cellulitis improved and she was discharged on Zyvox.  PT was recommended at discharge for deconditioning. She has a history of atrial fibrillation, aortic stenosis, and chronic diastolic dysfunction, hypertension, bradycardia with pacemaker.  Past Medical History:  Diagnosis Date   Aortic  stenosis 03/08/2016   Brady-tachy syndrome ([place])    a. Biotronik dual chamber PPM implanted 2009 b. gen change to STJ dual chamber PPM 2017   Ejection fraction    EF 70%, echo, October, 2009  //   EF 55-60%, septal dyssynergy consistent with a paced rhythm, mild to moderate mitral regurgitation, echo, November, 2015    GERD (gastroesophageal reflux disease) 04/28/2014   Episodes November, 2015 with fluid refluxing from her esophagus.    Hair loss    Patient questioned Coumadin, changed toPradaxa   Hypertension    Hypothyroidism    Lower extremity edema 01/22/2018   Mitral regurgitation 05/03/2014   Mild-to-moderate mitral regurgitation, echo, November, 2015    Osteoarthritis of left knee 10/25/2017   Paroxysmal atrial fibrillation (HCC)    Episodes rapid atrial fib noted by pacemaker interrogation, August, 2011, diltiazem added, patient improved. Patient continues on Rythmol  //   Changed to flecainide 2013  //   flecainide level checked in 2013, good level    Persistent atrial fibrillation ([place])    Past Surgical History:  Procedure Laterality Date   ABDOMINAL HYSTERECTOMY     EP IMPLANTABLE DEVICE N/A 08/08/2015   Generator change with [employer] DR PPM by Dr Rayann Heman   JOINT REPLACEMENT     PACEMAKER PLACEMENT  2009        reports that she quit smoking about 31 years ago. Her smoking use included cigarettes. She has never used smokeless tobacco. She reports that she does not drink alcohol and does not use drugs. Social History   Socioeconomic History   Marital status: Widowed    Spouse name: Not  on file   Number of children: Not on file   Years of education: Not on file   Highest education level: Not on file  Occupational History   Not on file  Tobacco Use   Smoking status: Former    Types: Cigarettes    Quit date: 09/06/1990    Years since quitting: 31.4   Smokeless tobacco: Never  Vaping Use   Vaping Use: Never used  Substance and Sexual Activity   Alcohol use: No    Drug use: No   Sexual activity: Not on file  Other Topics Concern   Not on file  Social History Narrative   Not on file   Social Determinants of Health   Financial Resource Strain: Not on file  Food Insecurity: No Food Insecurity (02/15/2022)   Hunger Vital Sign    Worried About Running Out of Food in the Last Year: Never true    Ran Out of Food in the Last Year: Never true  Transportation Needs: No Transportation Needs (02/15/2022)   PRAPARE - Hydrologist (Medical): No    Lack of Transportation (Non-Medical): No  Physical Activity: Not on file  Stress: Not on file  Social Connections: Not on file  Intimate Partner Violence: Not At Risk (02/15/2022)   Humiliation, Afraid, Rape, and Kick questionnaire    Fear of Current or Ex-Partner: No    Emotionally Abused: No    Physically Abused: No    Sexually Abused: No    Functional Status Survey:    Family History  Problem Relation Age of Onset   Heart disease Father    Coronary artery disease Other    Heart disease Son     Health Maintenance  Topic Date Due   Zoster Vaccines- Shingrix (1 of 2) Never done   COVID-19 Vaccine (4 - Moderna risk series) 06/14/2020   TETANUS/TDAP  11/20/2021   INFLUENZA VACCINE  01/09/2022   Pneumonia Vaccine 42+ Years old  Completed   DEXA SCAN  Completed   HPV VACCINES  Aged Out    Allergies  Allergen Reactions   Morphine And Related Other (See Comments)    Patient states that she went "crazy" with this   Penicillins Swelling and Rash    Has patient had a PCN reaction causing immediate rash, facial/tongue/throat swelling, SOB or lightheadedness with hypotension: {Yes/No:30480221} Has patient had a PCN reaction causing severe rash involving mucus membranes or skin necrosis: {Yes/No:30480221} Has patient had a PCN reaction that required hospitalization {Yes/No:30480221} Has patient had a PCN reaction occurring within the last 10 years: {Yes/No:30480221} If all of  the above answers are "NO", then may proceed with Cephalosporin use.   Lasix [Furosemide] Itching    Patient stated it made her itch all over and dry.   Aspirin     Pacemaker   Clindamycin/Lincomycin Other (See Comments)    Any meds with mycin in the name Unknown reaction   Crestor [Rosuvastatin]     Other reaction(s): muscle pain   Diltiazem     Skin discoloration, lower extremity swelling   Erythromycin Other (See Comments)    Any meds with mycin in the name Unknown reaction   Lisinopril Other (See Comments)    REACTION: Cough   Metronidazole     Other reaction(s): mouth sores, tongue swelling   Penicillins Cross Reactors Other (See Comments)    Any meds with mycin in the name Unknown reaction    Outpatient Encounter Medications as of 02/20/2022  Medication Sig   acetaminophen (TYLENOL) 325 MG tablet Take 2 tablets (650 mg total) by mouth every 6 (six) hours as needed.   apixaban (ELIQUIS) 5 MG TABS tablet Take 1 tablet (5 mg total) by mouth 2 (two) times daily.   Apoaequorin (PREVAGEN PO) Take 1 capsule by mouth daily.   Calcium Carbonate-Vit D-Min (CALCIUM 600+D PLUS MINERALS) 600-400 MG-UNIT TABS Take 1 tablet by mouth daily.   flecainide (TAMBOCOR) 50 MG tablet Take 1 tablet (50 mg total) by mouth 2 (two) times daily.   furosemide (LASIX) 20 MG tablet Take 1 tablet (20 mg total) by mouth daily.   levothyroxine (SYNTHROID) 100 MCG tablet Take 100 mcg by mouth every morning.   lidocaine (LIDODERM) 5 % Place 1 patch onto the skin daily as needed (for knee pain). Remove & Discard patch within 12 hours or as directed by MD   linezolid (ZYVOX) 600 MG tablet Take 1 tablet (600 mg total) by mouth every 12 (twelve) hours for 5 days.   methocarbamol (ROBAXIN) 500 MG tablet Take 500 mg by mouth 2 (two) times daily.   metoprolol succinate (TOPROL-XL) 25 MG 24 hr tablet Take 3 tablets (75 mg total) by mouth daily.   Multiple Vitamins-Minerals (OCUVITE PO) Take 1 tablet by mouth daily.    potassium chloride (KLOR-CON M) 10 MEQ tablet Take 1 tablet (10 mEq total) by mouth daily.   No facility-administered encounter medications on file as of 02/20/2022.    Review of Systems  Constitutional:  Positive for fatigue.  HENT: Negative.    Respiratory: Negative.    Cardiovascular: Negative.   Genitourinary: Negative.   Hematological:  Bruises/bleeds easily.  Psychiatric/Behavioral:  Positive for decreased concentration.   All other systems reviewed and are negative.   Vitals:   02/20/22 0858  BP: (!) 161/78  Pulse: 63  Resp: 16  Temp: (!) 97.5 F (36.4 C)  SpO2: 91%  Weight: 197 lb 3.2 oz (89.4 kg)  Height: '5\' 3"'$  (1.6 m)   Body mass index is 34.93 kg/m. Physical Exam Vitals and nursing note reviewed.  Constitutional:      Appearance: Normal appearance.  HENT:     Head: Normocephalic.     Mouth/Throat:     Mouth: Mucous membranes are moist.     Pharynx: Oropharynx is clear.  Eyes:     Pupils: Pupils are equal, round, and reactive to light.  Cardiovascular:     Rate and Rhythm: Normal rate.     Heart sounds: Normal heart sounds.  Pulmonary:     Effort: Pulmonary effort is normal.     Breath sounds: Normal breath sounds.  Abdominal:     General: Abdomen is flat. Bowel sounds are normal.  Musculoskeletal:     Cervical back: Normal range of motion.  Neurological:     General: No focal deficit present.     Mental Status: She is alert and oriented to person, place, and time.     Comments: Mentation is somewhat slowed and she has difficulty articulating some of her thoughts  Psychiatric:        Mood and Affect: Mood normal.        Behavior: Behavior normal.     Labs reviewed: Basic Metabolic Panel: Recent Labs    09/24/21 1121 02/11/22 2351 02/13/22 1609 02/14/22 0808 02/15/22 0444  NA 142   < > 139 139 141  K 4.1   < > 3.7 3.9 3.4*  CL 107   < > 108 109  109  CO2 27   < > '26 26 25  '$ GLUCOSE 110*   < > 99 102* 100*  BUN 15   < > '23 19 15   '$ CREATININE 0.93   < > 0.90 0.81 0.71  CALCIUM 9.7   < > 8.5* 8.4* 8.3*  MG 2.0  --   --  2.0 2.2   < > = values in this interval not displayed.   Liver Function Tests: Recent Labs    02/11/22 2351  AST 35  ALT 17  ALKPHOS 44  BILITOT 1.1  PROT 6.7  ALBUMIN 3.5   No results for input(s): "LIPASE", "AMYLASE" in the last 8760 hours. No results for input(s): "AMMONIA" in the last 8760 hours. CBC: Recent Labs    02/11/22 2351 02/12/22 0815 02/14/22 0415 02/14/22 0808 02/15/22 0444  WBC 16.9*   < > 9.3 9.4 6.7  NEUTROABS 14.7*  --   --  7.5 4.1  HGB 13.3   < > 12.1 12.2 11.9*  HCT 41.4   < > 37.3 36.8 36.4  MCV 99.8   < > 98.9 97.9 97.6  PLT 113*   < > 97* 95* 110*   < > = values in this interval not displayed.   Cardiac Enzymes: No results for input(s): "CKTOTAL", "CKMB", "CKMBINDEX", "TROPONINI" in the last 8760 hours. BNP: Invalid input(s): "POCBNP" No results found for: "HGBA1C" Lab Results  Component Value Date   TSH 2.159 09/24/2021   No results found for: "VITAMINB12" No results found for: "FOLATE" No results found for: "IRON", "TIBC", "FERRITIN"  Imaging and Procedures obtained prior to SNF admission: US PELVIS (TRANSABDOMINAL ONLY)  Result Date: 02/12/2022 CLINICAL DATA:  88 year old female with a history of hysterectomy. Left adnexa region cyst on left hip CT last month. EXAM: TRANSABDOMINAL ULTRASOUND OF PELVIS TECHNIQUE: Transabdominal ultrasound examination of the pelvis was performed including evaluation of the uterus, ovaries, adnexal regions, and pelvic cul-de-sac. COMPARISON:  Left hip CT 01/22/2022. FINDINGS: Uterus Measurements: Surgically absent. Endometrium Thickness: Surgically absent. Right ovary Measurements: Not identified. Left ovary Measurements: No normal left ovarian parenchyma identified. Vague roughly 4 cm hypoechoic area posterior to the left urinary bladder in the area of CT finding last month (series 1, image 22). No vascular elements are  evident on Doppler. There is pronounced increased through transmission. Other findings:  No pelvic free fluid. IMPRESSION: 1. Roughly 4 cm cystic area in the left hemipelvis posterior to the urinary bladder corresponding to CT finding last month. This demonstrates no evidence of vascular elements, and is probably a simple cyst, but visualization is somewhat limited. As there is Not superior visualization today, recommend 6-12 month follow-up Ultrasound to assess growth. This recommendation follows the consensus statement: 2019 SRU Consensus Conference Update on Follow-up and Reporting. Radiology 2019; [id number]. 2. Absent uterus.  Right ovary not identified. Electronically Signed   By: Genevie Ann M.D.   On: 02/12/2022 08:31   CT Head Wo Contrast  Result Date: 02/12/2022 CLINICAL DATA:  Altered mental status. EXAM: CT HEAD WITHOUT CONTRAST TECHNIQUE: Contiguous axial images were obtained from the base of the skull through the vertex without intravenous contrast. RADIATION DOSE REDUCTION: This exam was performed according to the departmental dose-optimization program which includes automated exposure control, adjustment of the mA and/or kV according to patient size and/or use of iterative reconstruction technique. COMPARISON:  Head CT dated 01/22/2022. FINDINGS: Brain: Mild age-related atrophy and chronic microvascular ischemic changes. There is no acute intracranial hemorrhage. No mass effect  or midline shift no extra-axial fluid collection. Vascular: No hyperdense vessel or unexpected calcification. Skull: Normal. Negative for fracture or focal lesion. Sinuses/Orbits: No acute finding. Other: None IMPRESSION: 1. No acute intracranial pathology. 2. Mild age-related atrophy and chronic microvascular ischemic changes. Electronically Signed   By: Anner Crete M.D.   On: 02/12/2022 01:15   DG Chest 2 View  Result Date: 02/12/2022 CLINICAL DATA:  Suspected sepsis. Nonproductive cough for 1 week, weakness. EXAM:  CHEST - 2 VIEW COMPARISON:  01/05/2021, 12/20/2019. FINDINGS: Heart is enlarged the mediastinal contour is stable. Atherosclerotic calcification of the aorta is noted. Strandy opacities are present at the left lung base and there is blunting of the left costophrenic angle. Mild airspace disease is noted in the right upper lobe. No pneumothorax. Degenerative changes are noted in the thoracic spine. Stable compression deformities are present in the mid and lower thoracic spine. IMPRESSION: 1. Mild airspace disease in the right upper lobe and left lung base, possible developing pneumonia. 2. Blunting of the left costophrenic angle, possible small pleural effusion. Electronically Signed   By: Brett Fairy M.D.   On: 02/12/2022 00:30    Assessment/Plan 1. Brady-tachy syndrome (HCC) Symptoms are well controlled.  Patient has pacemaker and followed by cardiology  2. Chronic congestive heart failure, unspecified heart failure type ([place]) Well compensated at this time.  Patient does have some dependent edema but denies shortness of breath and lungs are clear  3. Essential hypertension, benign Blood pressure slightly elevated.  On metoprolol as well as Lasix  4. Left leg cellulitis This has resolved.  Will finish course of Zyvox.  Review of cultures from the hospital failed to grow any organisms    Family/ staff Communication:   Labs/tests ordered:

## 2022-02-22 ENCOUNTER — Encounter: Payer: Self-pay | Admitting: Nurse Practitioner

## 2022-02-22 DIAGNOSIS — N183 Chronic kidney disease, stage 3 unspecified: Secondary | ICD-10-CM | POA: Insufficient documentation

## 2022-02-22 LAB — BASIC METABOLIC PANEL
BUN: 20 (ref 4–21)
CO2: 29 — AB (ref 13–22)
Chloride: 100 (ref 99–108)
Creatinine: 1 (ref 0.5–1.1)
Glucose: 83
Potassium: 4.2 mEq/L (ref 3.5–5.1)
Sodium: 137 (ref 137–147)

## 2022-02-22 LAB — COMPREHENSIVE METABOLIC PANEL
Albumin: 4 (ref 3.5–5.0)
Calcium: 9.3 (ref 8.7–10.7)
Globulin: 3.3
eGFR: 52

## 2022-02-22 LAB — HEPATIC FUNCTION PANEL
ALT: 21 U/L (ref 7–35)
AST: 33 (ref 13–35)
Alkaline Phosphatase: 57 (ref 25–125)
Bilirubin, Total: 1.1

## 2022-02-22 LAB — CBC AND DIFFERENTIAL
HCT: 42 (ref 36–46)
Hemoglobin: 14.3 (ref 12.0–16.0)
Neutrophils Absolute: 4930
Platelets: 211 10*3/uL (ref 150–400)
WBC: 7.8

## 2022-02-22 LAB — CBC: RBC: 4.42 (ref 3.87–5.11)

## 2022-02-27 ENCOUNTER — Encounter: Payer: Self-pay | Admitting: Nurse Practitioner

## 2022-02-27 ENCOUNTER — Non-Acute Institutional Stay (SKILLED_NURSING_FACILITY): Payer: Medicare Other | Admitting: Nurse Practitioner

## 2022-02-27 DIAGNOSIS — R1319 Other dysphagia: Secondary | ICD-10-CM

## 2022-02-27 DIAGNOSIS — E876 Hypokalemia: Secondary | ICD-10-CM

## 2022-02-27 DIAGNOSIS — I10 Essential (primary) hypertension: Secondary | ICD-10-CM

## 2022-02-27 DIAGNOSIS — E039 Hypothyroidism, unspecified: Secondary | ICD-10-CM

## 2022-02-27 DIAGNOSIS — M1712 Unilateral primary osteoarthritis, left knee: Secondary | ICD-10-CM

## 2022-02-27 DIAGNOSIS — I495 Sick sinus syndrome: Secondary | ICD-10-CM

## 2022-02-27 DIAGNOSIS — I350 Nonrheumatic aortic (valve) stenosis: Secondary | ICD-10-CM | POA: Diagnosis not present

## 2022-02-27 DIAGNOSIS — I4819 Other persistent atrial fibrillation: Secondary | ICD-10-CM

## 2022-02-27 DIAGNOSIS — N1831 Chronic kidney disease, stage 3a: Secondary | ICD-10-CM

## 2022-02-27 DIAGNOSIS — I509 Heart failure, unspecified: Secondary | ICD-10-CM | POA: Diagnosis not present

## 2022-02-27 NOTE — Assessment & Plan Note (Signed)
moderate

## 2022-02-27 NOTE — Assessment & Plan Note (Signed)
taking Levothyroxine, TSH 2.159 09/24/21 

## 2022-02-27 NOTE — Assessment & Plan Note (Signed)
mild edema BLE, on Furosemide. EF 70-75% 4/11/2

## 2022-02-27 NOTE — Assessment & Plan Note (Signed)
Heart rate is in control, on Metoprolol, Flecainide, Eliquis.  

## 2022-02-27 NOTE — Progress Notes (Unsigned)
Location:   [facility] [place] Room Number: 84 Place of Service:  SNF (31)  Provider: Marlana Latus NP  PCP: Aretta Nip, MD Patient Care Team: Aretta Nip, MD as PCP - General (Family Medicine) Thompson Grayer, MD as PCP - Electrophysiology (Cardiology) Freada Bergeron, MD as PCP - Cardiology (Cardiology) Neldon Labella, RN as Case Manager  Extended Emergency Contact Information Primary Emergency Contact: Pat Kocher Address: [address], [place] Phone: [phone] Mobile Phone: [phone] Relation: Daughter Secondary Emergency Contact: Sonny Masters Home Phone: [phone] Mobile Phone: [phone] Relation: Daughter Interpreter needed? No  Code Status: DNR Goals of care:  Advanced Directive information    02/27/2022   11:41 AM  Advanced Directives  Does Patient Have a Medical Advance Directive? Yes  Type of Paramedic of [place];Living will;Out of facility DNR (pink MOST or yellow form)  Does patient want to make changes to medical advance directive? No - Patient declined  Copy of Nathalie in Chart? Yes - validated most recent copy scanned in chart (See row information)  Pre-existing out of facility DNR order (yellow form or pink MOST form) Yellow form placed in chart (order not valid for inpatient use)       Chief Complaint  Patient presents with   Acute Visit    discharge    HPI:  88 y.o. female with PMH significant of Afib, HTN, moderate AS, Pacemaker, CHF, Dysphagia, mild cognitive impairment, and Hypokalemia was admitted to [facility] for therapy following hospital stay for Sepsis, LLE cellulitis and possible UTI    Hospitalized 02/11/22-02/16/22 for sepsis, LLE cellulitis and possible UTI(no urine culture available),  fully treated, complete 5 day course of  Zyvox at SNF [facility]. Her mentation has  returned her baseline, wbc down to 6.7 02/15/22/7.8  02/22/22, 02/12/22 CXR Mild airspace disease in the right upper lobe and left lung base  The patient has recovered from sepsis, cellulitis, and possible UTI. She has regained physical strength, ALD function. She is ready to transition to AL for continue therapy.  possible developing pneumonia, no respiratory symptoms presently.              Afib, on Metoprolol, Flecainide, Eliquis.              AS moderate             HTN on Metoprolol, Furosemide, intermittently elevated Sbp, f/u Cardiology.              Pacemaker, f/u Cardiology             CHF, mild edema BLE, on Furosemide. EF 70-75% 09/19/21  CKD Bun/creat 20/1.02 eGF 59 02/22/22             Dysphagia, nectar liquid.              Hypothyroidism, taking Levothyroxine, TSH 2.159 09/24/21             OA, takes Tylenol, Methocarbamol.              CT head 02/12/22 Mild age-related atrophy and chronic microvascular ischemic changes.             Hypokalemia, K 4.2 02/22/22  Past Medical History:  Diagnosis Date   Aortic stenosis 03/08/2016   Brady-tachy syndrome ([place])    a. Biotronik dual chamber PPM implanted 2009 b. gen change to STJ dual chamber PPM  2017   Ejection fraction    EF 70%, echo, October, 2009  //   EF 55-60%, septal dyssynergy consistent with a paced rhythm, mild to moderate mitral regurgitation, echo, November, 2015    GERD (gastroesophageal reflux disease) 04/28/2014   Episodes November, 2015 with fluid refluxing from her esophagus.    Hair loss    Patient questioned Coumadin, changed toPradaxa   Hypertension    Hypothyroidism    Lower extremity edema 01/22/2018   Mitral regurgitation 05/03/2014   Mild-to-moderate mitral regurgitation, echo, November, 2015    Osteoarthritis of left knee 10/25/2017   Paroxysmal atrial fibrillation (HCC)    Episodes rapid atrial fib noted by pacemaker interrogation, August, 2011, diltiazem added, patient improved. Patient continues on Rythmol  //   Changed to flecainide 2013  //   flecainide level  checked in 2013, good level    Persistent atrial fibrillation ([place])     Past Surgical History:  Procedure Laterality Date   ABDOMINAL HYSTERECTOMY     EP IMPLANTABLE DEVICE N/A 08/08/2015   Generator change with [employer] DR PPM by Dr Rayann Heman   JOINT REPLACEMENT     PACEMAKER PLACEMENT  2009          reports that she quit smoking about 31 years ago. Her smoking use included cigarettes. She has never used smokeless tobacco. She reports that she does not drink alcohol and does not use drugs. Social History   Socioeconomic History   Marital status: Widowed    Spouse name: Not on file   Number of children: Not on file   Years of education: Not on file   Highest education level: Not on file  Occupational History   Not on file  Tobacco Use   Smoking status: Former    Types: Cigarettes    Quit date: 09/06/1990    Years since quitting: 31.5   Smokeless tobacco: Never  Vaping Use   Vaping Use: Never used  Substance and Sexual Activity   Alcohol use: No   Drug use: No   Sexual activity: Not on file  Other Topics Concern   Not on file  Social History Narrative   Not on file   Social Determinants of Health   Financial Resource Strain: Not on file  Food Insecurity: No Food Insecurity (02/15/2022)   Hunger Vital Sign    Worried About Running Out of Food in the Last Year: Never true    Ran Out of Food in the Last Year: Never true  Transportation Needs: No Transportation Needs (02/15/2022)   PRAPARE - Hydrologist (Medical): No    Lack of Transportation (Non-Medical): No  Physical Activity: Not on file  Stress: Not on file  Social Connections: Not on file  Intimate Partner Violence: Not At Risk (02/15/2022)   Humiliation, Afraid, Rape, and Kick questionnaire    Fear of Current or Ex-Partner: No    Emotionally Abused: No    Physically Abused: No    Sexually Abused: No   Functional Status Survey:      Pertinent  Health Maintenance Due  Topic  Date Due   INFLUENZA VACCINE  01/09/2022   DEXA SCAN  Completed    Medications:    Review of Systems  Constitutional:  Negative for activity change, appetite change and fever.  HENT:  Positive for trouble swallowing. Negative for congestion.   Eyes:  Negative for visual disturbance.  Respiratory:  Negative for cough, shortness of breath and wheezing.  Cardiovascular:  Positive for leg swelling. Negative for chest pain and palpitations.  Gastrointestinal:  Negative for abdominal pain, constipation, nausea and vomiting.  Genitourinary:  Negative for dysuria, frequency and urgency.  Musculoskeletal:  Positive for arthralgias and gait problem.  Skin:  Negative for color change.  Neurological:  Negative for speech difficulty, weakness and headaches.  Psychiatric/Behavioral:  Negative for behavioral problems and sleep disturbance. The patient is not nervous/anxious.     Vitals:   02/27/22 1059  BP: (!) 147/64  Pulse: 66  Resp: 18  Temp: 97.8 F (36.6 C)  SpO2: 94%  Weight: 191 lb (86.6 kg)  Height: '5\' 3"'$  (1.6 m)   Body mass index is 33.83 kg/m. Physical Exam Vitals and nursing note reviewed.  Constitutional:      Appearance: Normal appearance.  HENT:     Head: Normocephalic and atraumatic.     Nose: Nose normal.     Mouth/Throat:     Mouth: Mucous membranes are moist.  Eyes:     Extraocular Movements: Extraocular movements intact.     Conjunctiva/sclera: Conjunctivae normal.     Pupils: Pupils are equal, round, and reactive to light.  Cardiovascular:     Rate and Rhythm: Normal rate. Rhythm irregular.     Heart sounds: Murmur heard.  Pulmonary:     Effort: Pulmonary effort is normal.     Breath sounds: No rales.  Abdominal:     General: Bowel sounds are normal.     Palpations: Abdomen is soft.     Tenderness: There is no abdominal tenderness.  Musculoskeletal:        General: Normal range of motion.     Cervical back: Normal range of motion and neck supple.      Right lower leg: Edema present.     Left lower leg: Edema present.     Comments: 1+ edema LLE, trace edema RLE  Skin:    General: Skin is warm and dry.     Findings: Erythema present.     Comments: Left lower leg mild erythema  Neurological:     General: No focal deficit present.     Mental Status: She is alert and oriented to person, place, and time. Mental status is at baseline.     Gait: Gait abnormal.  Psychiatric:        Mood and Affect: Mood normal.        Behavior: Behavior normal.        Thought Content: Thought content normal.        Judgment: Judgment normal.     Labs reviewed: Basic Metabolic Panel: Recent Labs    09/24/21 1121 02/11/22 2351 02/13/22 1609 02/14/22 0808 02/15/22 0444  NA 142   < > 139 139 141  K 4.1   < > 3.7 3.9 3.4*  CL 107   < > 108 109 109  CO2 27   < > '26 26 25  '$ GLUCOSE 110*   < > 99 102* 100*  BUN 15   < > '23 19 15  '$ CREATININE 0.93   < > 0.90 0.81 0.71  CALCIUM 9.7   < > 8.5* 8.4* 8.3*  MG 2.0  --   --  2.0 2.2   < > = values in this interval not displayed.   Liver Function Tests: Recent Labs    02/11/22 2351  AST 35  ALT 17  ALKPHOS 44  BILITOT 1.1  PROT 6.7  ALBUMIN 3.5   No results for  input(s): "LIPASE", "AMYLASE" in the last 8760 hours. No results for input(s): "AMMONIA" in the last 8760 hours. CBC: Recent Labs    02/11/22 2351 02/12/22 0815 02/14/22 0415 02/14/22 0808 02/15/22 0444  WBC 16.9*   < > 9.3 9.4 6.7  NEUTROABS 14.7*  --   --  7.5 4.1  HGB 13.3   < > 12.1 12.2 11.9*  HCT 41.4   < > 37.3 36.8 36.4  MCV 99.8   < > 98.9 97.9 97.6  PLT 113*   < > 97* 95* 110*   < > = values in this interval not displayed.   Cardiac Enzymes: No results for input(s): "CKTOTAL", "CKMB", "CKMBINDEX", "TROPONINI" in the last 8760 hours. BNP: Invalid input(s): "POCBNP" CBG: No results for input(s): "GLUCAP" in the last 8760 hours.  Procedures and Imaging Studies During Stay: US PELVIS (TRANSABDOMINAL ONLY)  Result  Date: 02/12/2022 CLINICAL DATA:  88 year old female with a history of hysterectomy. Left adnexa region cyst on left hip CT last month. EXAM: TRANSABDOMINAL ULTRASOUND OF PELVIS TECHNIQUE: Transabdominal ultrasound examination of the pelvis was performed including evaluation of the uterus, ovaries, adnexal regions, and pelvic cul-de-sac. COMPARISON:  Left hip CT 01/22/2022. FINDINGS: Uterus Measurements: Surgically absent. Endometrium Thickness: Surgically absent. Right ovary Measurements: Not identified. Left ovary Measurements: No normal left ovarian parenchyma identified. Vague roughly 4 cm hypoechoic area posterior to the left urinary bladder in the area of CT finding last month (series 1, image 22). No vascular elements are evident on Doppler. There is pronounced increased through transmission. Other findings:  No pelvic free fluid. IMPRESSION: 1. Roughly 4 cm cystic area in the left hemipelvis posterior to the urinary bladder corresponding to CT finding last month. This demonstrates no evidence of vascular elements, and is probably a simple cyst, but visualization is somewhat limited. As there is Not superior visualization today, recommend 6-12 month follow-up Ultrasound to assess growth. This recommendation follows the consensus statement: 2019 SRU Consensus Conference Update on Follow-up and Reporting. Radiology 2019; [id number]. 2. Absent uterus.  Right ovary not identified. Electronically Signed   By: Genevie Ann M.D.   On: 02/12/2022 08:31   CT Head Wo Contrast  Result Date: 02/12/2022 CLINICAL DATA:  Altered mental status. EXAM: CT HEAD WITHOUT CONTRAST TECHNIQUE: Contiguous axial images were obtained from the base of the skull through the vertex without intravenous contrast. RADIATION DOSE REDUCTION: This exam was performed according to the departmental dose-optimization program which includes automated exposure control, adjustment of the mA and/or kV according to patient size and/or use of iterative  reconstruction technique. COMPARISON:  Head CT dated 01/22/2022. FINDINGS: Brain: Mild age-related atrophy and chronic microvascular ischemic changes. There is no acute intracranial hemorrhage. No mass effect or midline shift no extra-axial fluid collection. Vascular: No hyperdense vessel or unexpected calcification. Skull: Normal. Negative for fracture or focal lesion. Sinuses/Orbits: No acute finding. Other: None IMPRESSION: 1. No acute intracranial pathology. 2. Mild age-related atrophy and chronic microvascular ischemic changes. Electronically Signed   By: Anner Crete M.D.   On: 02/12/2022 01:15   DG Chest 2 View  Result Date: 02/12/2022 CLINICAL DATA:  Suspected sepsis. Nonproductive cough for 1 week, weakness. EXAM: CHEST - 2 VIEW COMPARISON:  01/05/2021, 12/20/2019. FINDINGS: Heart is enlarged the mediastinal contour is stable. Atherosclerotic calcification of the aorta is noted. Strandy opacities are present at the left lung base and there is blunting of the left costophrenic angle. Mild airspace disease is noted in the right upper lobe. No pneumothorax. Degenerative  changes are noted in the thoracic spine. Stable compression deformities are present in the mid and lower thoracic spine. IMPRESSION: 1. Mild airspace disease in the right upper lobe and left lung base, possible developing pneumonia. 2. Blunting of the left costophrenic angle, possible small pleural effusion. Electronically Signed   By: Brett Fairy M.D.   On: 02/12/2022 00:30    Assessment/Plan:   CKD (chronic kidney disease) stage 3, GFR 30-59 ml/min (HCC) Bun/creat 20/1.02 eGF 59 02/22/22  Esophageal dysphagia nectar liquid.   Hypothyroidism taking Levothyroxine, TSH 2.159 09/24/21  Osteoarthritis of left knee takes Tylenol, Methocarbamol. Furniture walker in her room, walker to go further   Hypokalemia, inadequate intake K 4.2 02/22/22  Congestive heart failure (HCC) mild edema BLE, on Furosemide. EF 70-75%  4/11/2  Sinus node dysfunction (HCC) Pacemaker, f/u Cardiology  Essential hypertension, benign on Metoprolol, Furosemide, intermittently elevated Sbp, f/u Cardiology.   Aortic stenosis moderate  Persistent atrial fibrillation (HCC) Heart rate is in control, on Metoprolol, Flecainide, Eliquis.    Patient is being discharged with the following home health services:    Patient is being discharged with the following durable medical equipment:    Patient has been advised to f/u with their PCP in 1-2 weeks to bring them up to date on their rehab stay.  Social services at facility was responsible for arranging this appointment.  Pt was provided with a 30 day supply of prescriptions for medications and refills must be obtained from their PCP.  For controlled substances, a more limited supply may be provided adequate until PCP appointment only.  Future labs/tests needed:  prn

## 2022-02-27 NOTE — Assessment & Plan Note (Signed)
Pacemaker, f/u Cardiology 

## 2022-02-27 NOTE — Assessment & Plan Note (Signed)
on Metoprolol, Furosemide, intermittently elevated Sbp, f/u Cardiology.  

## 2022-02-27 NOTE — Assessment & Plan Note (Signed)
takes Tylenol, Methocarbamol. Furniture walker in her room, walker to go further

## 2022-02-27 NOTE — Assessment & Plan Note (Signed)
K 4.2 02/22/22

## 2022-02-27 NOTE — Progress Notes (Unsigned)
This encounter was created in error - please disregard.

## 2022-02-27 NOTE — Assessment & Plan Note (Signed)
Bun/creat 20/1.02 eGF 59 02/22/22

## 2022-02-27 NOTE — Assessment & Plan Note (Signed)
nectar liquid 

## 2022-03-01 NOTE — Progress Notes (Signed)
This encounter was created in error - please disregard.

## 2022-03-05 DIAGNOSIS — G9341 Metabolic encephalopathy: Secondary | ICD-10-CM | POA: Diagnosis not present

## 2022-03-05 DIAGNOSIS — R29898 Other symptoms and signs involving the musculoskeletal system: Secondary | ICD-10-CM | POA: Diagnosis not present

## 2022-03-05 DIAGNOSIS — R41841 Cognitive communication deficit: Secondary | ICD-10-CM | POA: Diagnosis not present

## 2022-03-05 DIAGNOSIS — R1310 Dysphagia, unspecified: Secondary | ICD-10-CM | POA: Diagnosis not present

## 2022-03-05 DIAGNOSIS — M6281 Muscle weakness (generalized): Secondary | ICD-10-CM | POA: Diagnosis not present

## 2022-03-05 DIAGNOSIS — R1312 Dysphagia, oropharyngeal phase: Secondary | ICD-10-CM | POA: Diagnosis not present

## 2022-03-05 DIAGNOSIS — R2681 Unsteadiness on feet: Secondary | ICD-10-CM | POA: Diagnosis not present

## 2022-03-05 NOTE — Progress Notes (Unsigned)
Office Visit    Patient Name: [name] Date of Encounter: 03/06/2022  PCP:  Aretta Nip, MD   [place]  Cardiologist:  Freada Bergeron, MD  Advanced Practice Provider:  No care team member to display Electrophysiologist:  Thompson Grayer, MD   HPI    [name] is a 88 y.o. female with past medical history of PAF on chronic anticoagulation and AAD, tachybradycardia syndrome status post PPM implant 2009 with generator change Saint Jude dual chamber 2017, hypertension, hypothyroidism, hypertension, aortic stenosis, chronic HFpEF presents today for follow-up appointment.  Previously seen by Dr. Meda Coffee and transferred care to Dr. Johney Frame.  Her Pradaxa was changed to Eliquis for atrial fibrillation anticoagulation.  History includes echocardiogram 09/2021 which showed EF 70 to 75%, severe concentric LVH, severe MAC and moderate AAS with mean gradient of 25.2 mmHg (felt to be secondary to AAS and high output state).  She was seen in the ER on 09/2021 for evaluation of palpitations and was found to be in atrial flutter with 2-1 conduction and cardioverted in the ER.  Discharged home.  She was seen by Nell Range, PA on 10/19/2021 for ER follow-up.  Noted to be a good TAVR candidate if a has progressed to severe, recommendation to monitor TTE annually.  Seen by Dr. Rayann Heman 11/01/2021 with normal pacemaker function, not device dependent at that visit.  He advised her to take an additional flecainide 100 mg twice daily if atrial fibrillation increases.  She was advised she may also take an additional metoprolol succinate.  She was referred to the atrial fibrillation clinic.  Seen in the A-fib clinic by Roderic Palau, NP on 01/03/2022.  Maintained on Eliquis 5 mg twice daily for CHA2DS2-VASc score of 5.  Medication instructions as noted above were reviewed, no changes were made at that time.  She was advised to follow-up in 6 months.  She was seen about a  month ago by Stephan Minister, NP and at that time she had just sustained a fall and was in the ED.  She had some left knee and left leg discomfort.  The fall occurred when she was trying to catch a spider that was in her house, no presyncope or syncope.  Denied palpitations, fast heart rate, chest pain, and shortness of breath.  Reports some medication misunderstanding and short-term memory issues.  Today, she was recently in the hospital for lower left extremity cellulitis and sepsis.  She was treated with IV antibiotics and then discharged on 5 days of oral antibiotics.  She still has some residual swelling and redness on that side.  Denies fevers, chills.  She states that it does feel better and the swelling has gone down since she was discharged from the hospital.  She is no longer on antibiotics.  We discussed use of TED hose during the day when she is up and moving about and keeping her leg elevated when she is sitting watching TV.  Since she is still having some swelling bilaterally but more on the left we have increased her Lasix and potassium for a few days.  Reports no shortness of breath nor dyspnea on exertion. Reports no chest pain, pressure, or tightness.  Reports no palpitations.    Past Medical History    Past Medical History:  Diagnosis Date   Aortic stenosis 03/08/2016   Brady-tachy syndrome ([place])    a. Biotronik dual chamber PPM implanted 2009 b. gen change to STJ dual chamber  PPM 2017   Ejection fraction    EF 70%, echo, October, 2009  //   EF 55-60%, septal dyssynergy consistent with a paced rhythm, mild to moderate mitral regurgitation, echo, November, 2015    GERD (gastroesophageal reflux disease) 04/28/2014   Episodes November, 2015 with fluid refluxing from her esophagus.    Hair loss    Patient questioned Coumadin, changed toPradaxa   Hypertension    Hypothyroidism    Lower extremity edema 01/22/2018   Mitral regurgitation 05/03/2014   Mild-to-moderate mitral  regurgitation, echo, November, 2015    Osteoarthritis of left knee 10/25/2017   Paroxysmal atrial fibrillation ([facility])    Episodes rapid atrial fib noted by pacemaker interrogation, August, 2011, diltiazem added, patient improved. Patient continues on Rythmol  //   Changed to flecainide 2013  //   flecainide level checked in 2013, good level    Persistent atrial fibrillation ([place])    Past Surgical History:  Procedure Laterality Date   ABDOMINAL HYSTERECTOMY     EP IMPLANTABLE DEVICE N/A 08/08/2015   Generator change with S[employer] DR PPM by Dr Rayann Heman   JOINT REPLACEMENT     PACEMAKER PLACEMENT  2009        Allergies  Allergies  Allergen Reactions   Morphine And Related Other (See Comments)    Patient states that she went "crazy" with this   Penicillins Swelling and Rash    Has patient had a PCN reaction causing immediate rash, facial/tongue/throat swelling, SOB or lightheadedness with hypotension:  Has patient had a PCN reaction causing severe rash involving mucus membranes or skin necrosis:  Has patient had a PCN reaction that required hospitalization Has patient had a PCN reaction occurring within the last 10 years: If all of the above answers are "NO", then may proceed with Cephalosporin use.   Lasix [Furosemide] Itching    Patient stated it made her itch all over and dry.   Aspirin     Pacemaker   Clindamycin/Lincomycin Other (See Comments)    Any meds with mycin in the name Unknown reaction   Crestor [Rosuvastatin]     Other reaction(s): muscle pain   Diltiazem     Skin discoloration, lower extremity swelling   Erythromycin Other (See Comments)    Any meds with mycin in the name Unknown reaction   Lisinopril Other (See Comments)    REACTION: Cough   Metronidazole     Other reaction(s): mouth sores, tongue swelling   Penicillins Cross Reactors Other (See Comments)    Any meds with mycin in the name Unknown reaction     EKGs/Labs/Other Studies Reviewed:   The  following studies were reviewed today:  Echo 09/19/21    1. Left ventricular ejection fraction, by estimation, is 70 to 75%. The  left ventricle has hyperdynamic function. The left ventricle has no  regional wall motion abnormalities. There is severe concentric left  ventricular hypertrophy. Diastolic function  indeterminant due to severe MAC. The average left ventricular global  longitudinal strain is -19.6 %. The global longitudinal strain is normal.   2. Right ventricular systolic function is normal. The right ventricular  size is normal. There is normal pulmonary artery systolic pressure. The  estimated right ventricular systolic pressure is 24.4 mmHg.   3. Left atrial size was mildly dilated.   4. The mitral valve is abnormal. Mild mitral valve regurgitation. Severe  mitral annular calcification.   5. The aortic valve is calcified. There is moderate calcification of the  aortic  valve. There is moderate thickening of the aortic valve. Aortic  valve regurgitation is not visualized. Mild to moderate vs moderate aortic  valve stenosis. Aortic valve area,   by VTI measures 1.65 cm. Aortic valve mean gradient measures 25.5 mmHg.  Aortic valve Vmax measures 3.26 m/s. DI 0.4. Elevated gradients are due to  both aortic stenosis and high output state (LVOT VTI 34.6).   Comparison(s): Compared to prior TTE on 09/20/20, there is no significant  change. There continues to be moderate aortic stenosis with mean gradient  80mHg on prior study  EKG:  EKG is  ordered today.  The ekg ordered today demonstrates sinus bradycardia, rate 56 bpm, RBBB (old)?  Recent Labs: 09/24/2021: B Natriuretic Peptide 237.1; TSH 2.159 02/11/2022: ALT 17 02/15/2022: BUN 15; Creatinine, Ser 0.71; Hemoglobin 11.9; Magnesium 2.2; Platelets 110; Potassium 3.4; Sodium 141  Recent Lipid Panel    Component Value Date/Time   CHOL 193 05/13/2017 1224   TRIG 152 (H) 05/13/2017 1224   HDL 41 05/13/2017 1224   CHOLHDL 4.7 (H)  05/13/2017 1224   CHOLHDL 3.9 11/21/2011 1322   VLDL 25 11/21/2011 1322   LDLCALC 122 (H) 05/13/2017 1224    Risk Assessment/Calculations:   CHA2DS2-VASc Score = 5   This indicates a 7.2% annual risk of stroke. The patient's score is based upon: CHF History: 1 HTN History: 1 Diabetes History: 0 Stroke History: 0 Vascular Disease History: 0 Age Score: 2 Gender Score: 1     Home Medications   Current Meds  Medication Sig   acetaminophen (TYLENOL) 325 MG tablet Take 2 tablets (650 mg total) by mouth every 6 (six) hours as needed.   apixaban (ELIQUIS) 5 MG TABS tablet Take 1 tablet (5 mg total) by mouth 2 (two) times daily.   Apoaequorin (PREVAGEN PO) Take 1 capsule by mouth daily.   Calcium Carbonate-Vit D-Min (CALCIUM 600+D PLUS MINERALS) 600-400 MG-UNIT TABS Take 1 tablet by mouth daily.   flecainide (TAMBOCOR) 50 MG tablet Take 1 tablet (50 mg total) by mouth 2 (two) times daily.   furosemide (LASIX) 20 MG tablet Take 1 tablet (20 mg total) by mouth daily.   levothyroxine (SYNTHROID) 100 MCG tablet Take 100 mcg by mouth every morning.   lidocaine (LIDODERM) 5 % Place 1 patch onto the skin daily as needed (for knee pain). Remove & Discard patch within 12 hours or as directed by MD   loperamide (IMODIUM A-D) 2 MG tablet Take 2 mg by mouth as needed for diarrhea or loose stools.   methocarbamol (ROBAXIN) 500 MG tablet Take 500 mg by mouth 2 (two) times daily.   metoprolol succinate (TOPROL-XL) 25 MG 24 hr tablet Take 3 tablets (75 mg total) by mouth daily.   Multiple Vitamins-Minerals (OCUVITE PO) Take 1 tablet by mouth daily.   nystatin (MYCOSTATIN/NYSTOP) powder Apply 1 Application topically 2 (two) times daily.   potassium chloride (KLOR-CON M) 10 MEQ tablet Take 1 tablet (10 mEq total) by mouth daily.     Review of Systems      All other systems reviewed and are otherwise negative except as noted above.  Physical Exam    VS:  BP 120/80   Pulse 60   Ht _0  (1.6 m)    Wt 190 lb 9.6 oz (86.5 kg)   SpO2 93%   BMI 33.76 kg/m  , BMI Body mass index is 33.76 kg/m.  Wt Readings from Last 3 Encounters:  03/06/22 190 lb 9.6 oz (86.5 kg)  02/27/22 191 lb (86.6 kg)  02/27/22 191 lb (86.6 kg)     GEN: Well nourished, well developed, in no acute distress. HEENT: normal. Neck: Supple, no JVD, carotid bruits, or masses. Cardiac: RRR, + systolic murmur, rubs, or gallops. No clubbing, cyanosis, 1+ pitting edema on the right and 2+ pitting edema on the left.  Radials/PT 2+ and equal bilaterally.  Respiratory:  Respirations regular and unlabored, clear to auscultation bilaterally. GI: Soft, nontender, nondistended. MS: No deformity or atrophy. Skin: Warm and dry, no rash. Neuro:  Strength and sensation are intact. Psych: Normal affect.  Assessment & Plan    Paroxysmal atrial fibrillation/Chronic anticoagulation -Continue Eliquis 5 mg twice daily -No issues with bleeding -No recent palpitations  Essential hypertension -Well-controlled today 120/80 -Continue metoprolol succinate 75 mg daily  Resolving cellulitis -Finished her antibiotic therapy -Still some residual redness on the left lower extremity as well as swelling -Provided handout for elastic therapy in [place] for compression hose -Encouraged elevation of both lower extremities while seated -We increased her Lasix to 20 mg twice daily and potassium to 10 mEq twice daily for 3 days and then she can return to usual dosing -BMP in a week  Chronic diastolic CHF -some lower extremity edema, lasix increased for a few days -Recent echocardiogram from April 2023 reviewed with normal LVEF.  Mild MR and moderate AI -Plan to repeat echocardiogram April 2024  Pacemaker -no recent issues  Aortic valve stenosis - Will plan to repeat Echo April 2024      Disposition: Follow up 6 months with Freada Bergeron, MD or APP.  Signed, Elgie Collard, PA-C 03/06/2022, 4:37 PM [facility]

## 2022-03-06 ENCOUNTER — Encounter: Payer: Self-pay | Admitting: Physician Assistant

## 2022-03-06 ENCOUNTER — Ambulatory Visit: Payer: Medicare Other | Attending: Cardiology | Admitting: Physician Assistant

## 2022-03-06 VITALS — BP 120/80 | HR 60 | Ht 63.0 in | Wt 190.6 lb

## 2022-03-06 DIAGNOSIS — Z950 Presence of cardiac pacemaker: Secondary | ICD-10-CM | POA: Diagnosis not present

## 2022-03-06 DIAGNOSIS — I350 Nonrheumatic aortic (valve) stenosis: Secondary | ICD-10-CM | POA: Insufficient documentation

## 2022-03-06 DIAGNOSIS — I5032 Chronic diastolic (congestive) heart failure: Secondary | ICD-10-CM | POA: Diagnosis not present

## 2022-03-06 DIAGNOSIS — R2681 Unsteadiness on feet: Secondary | ICD-10-CM | POA: Diagnosis not present

## 2022-03-06 DIAGNOSIS — I10 Essential (primary) hypertension: Secondary | ICD-10-CM | POA: Diagnosis not present

## 2022-03-06 DIAGNOSIS — Z7901 Long term (current) use of anticoagulants: Secondary | ICD-10-CM | POA: Diagnosis not present

## 2022-03-06 DIAGNOSIS — R1312 Dysphagia, oropharyngeal phase: Secondary | ICD-10-CM | POA: Diagnosis not present

## 2022-03-06 DIAGNOSIS — I480 Paroxysmal atrial fibrillation: Secondary | ICD-10-CM | POA: Diagnosis not present

## 2022-03-06 DIAGNOSIS — M6281 Muscle weakness (generalized): Secondary | ICD-10-CM | POA: Diagnosis not present

## 2022-03-06 DIAGNOSIS — R1310 Dysphagia, unspecified: Secondary | ICD-10-CM | POA: Diagnosis not present

## 2022-03-06 DIAGNOSIS — R41841 Cognitive communication deficit: Secondary | ICD-10-CM | POA: Diagnosis not present

## 2022-03-06 DIAGNOSIS — G9341 Metabolic encephalopathy: Secondary | ICD-10-CM | POA: Diagnosis not present

## 2022-03-06 NOTE — Patient Instructions (Signed)
Medication Instructions:  1.Increase lasix to 20 mg twice a day for the next 3 days, then resume 20 mg daily 2.Increase potassium to 10 meq twice a day for the next 3 days, then resume 10 meq daily *If you need a refill on your cardiac medications before your next appointment, please call your pharmacy*   Lab Work: BMP next week at [facility] If you have labs (blood work) drawn today and your tests are completely normal, you will receive your results only by: [place] (if you have MyChart) OR A paper copy in the mail If you have any lab test that is abnormal or we need to change your treatment, we will call you to review the results.   Testing/Procedures: Your physician has requested that you have an echocardiogram in April 2024. Echocardiography is a painless test that uses sound waves to create images of your heart. It provides your doctor with information about the size and shape of your heart and how well your heart's chambers and valves are working. This procedure takes approximately one hour. There are no restrictions for this procedure.    Follow-Up: At [facility], you and your health needs are our priority.  As part of our continuing mission to provide you with exceptional heart care, we have created designated Provider Care Teams.  These Care Teams include your primary Cardiologist (physician) and Advanced Practice Providers (APPs -  Physician Assistants and Nurse Practitioners) who all work together to provide you with the care you need, when you need it.  Your next appointment:   April 2024  The format for your next appointment:   In Person  Provider:   Freada Bergeron, MD   Other Instructions 1.Elevate both legs above your heart when you are seated 2.Remove compression stockings at night and put on a new pair the next morning 3.Monitor your leg for darkening or spreading of redness, if this happens let someone at the facility know or call our office    Important Information About Sugar      1.

## 2022-03-08 DIAGNOSIS — R1310 Dysphagia, unspecified: Secondary | ICD-10-CM | POA: Diagnosis not present

## 2022-03-08 DIAGNOSIS — G9341 Metabolic encephalopathy: Secondary | ICD-10-CM | POA: Diagnosis not present

## 2022-03-08 DIAGNOSIS — R1312 Dysphagia, oropharyngeal phase: Secondary | ICD-10-CM | POA: Diagnosis not present

## 2022-03-08 DIAGNOSIS — M6281 Muscle weakness (generalized): Secondary | ICD-10-CM | POA: Diagnosis not present

## 2022-03-08 DIAGNOSIS — R2681 Unsteadiness on feet: Secondary | ICD-10-CM | POA: Diagnosis not present

## 2022-03-08 DIAGNOSIS — R41841 Cognitive communication deficit: Secondary | ICD-10-CM | POA: Diagnosis not present

## 2022-03-09 ENCOUNTER — Ambulatory Visit: Payer: Medicare Other | Attending: Student | Admitting: Student

## 2022-03-09 ENCOUNTER — Non-Acute Institutional Stay: Payer: Medicare Other | Admitting: Family Medicine

## 2022-03-09 ENCOUNTER — Encounter: Payer: Self-pay | Admitting: Student

## 2022-03-09 ENCOUNTER — Encounter: Payer: Self-pay | Admitting: Family Medicine

## 2022-03-09 VITALS — BP 128/60 | HR 60 | Ht 63.0 in | Wt 189.0 lb

## 2022-03-09 VITALS — BP 122/72 | HR 79 | Resp 18

## 2022-03-09 DIAGNOSIS — R1312 Dysphagia, oropharyngeal phase: Secondary | ICD-10-CM | POA: Diagnosis not present

## 2022-03-09 DIAGNOSIS — I5032 Chronic diastolic (congestive) heart failure: Secondary | ICD-10-CM

## 2022-03-09 DIAGNOSIS — Z7901 Long term (current) use of anticoagulants: Secondary | ICD-10-CM | POA: Diagnosis not present

## 2022-03-09 DIAGNOSIS — B372 Candidiasis of skin and nail: Secondary | ICD-10-CM

## 2022-03-09 DIAGNOSIS — M25511 Pain in right shoulder: Secondary | ICD-10-CM | POA: Diagnosis not present

## 2022-03-09 DIAGNOSIS — Z950 Presence of cardiac pacemaker: Secondary | ICD-10-CM

## 2022-03-09 DIAGNOSIS — G8929 Other chronic pain: Secondary | ICD-10-CM | POA: Insufficient documentation

## 2022-03-09 DIAGNOSIS — M6281 Muscle weakness (generalized): Secondary | ICD-10-CM | POA: Diagnosis not present

## 2022-03-09 DIAGNOSIS — R41841 Cognitive communication deficit: Secondary | ICD-10-CM | POA: Diagnosis not present

## 2022-03-09 DIAGNOSIS — M25512 Pain in left shoulder: Secondary | ICD-10-CM | POA: Diagnosis not present

## 2022-03-09 DIAGNOSIS — R2681 Unsteadiness on feet: Secondary | ICD-10-CM | POA: Diagnosis not present

## 2022-03-09 DIAGNOSIS — R1310 Dysphagia, unspecified: Secondary | ICD-10-CM | POA: Diagnosis not present

## 2022-03-09 DIAGNOSIS — I480 Paroxysmal atrial fibrillation: Secondary | ICD-10-CM

## 2022-03-09 DIAGNOSIS — G9341 Metabolic encephalopathy: Secondary | ICD-10-CM | POA: Diagnosis not present

## 2022-03-09 DIAGNOSIS — I350 Nonrheumatic aortic (valve) stenosis: Secondary | ICD-10-CM | POA: Diagnosis not present

## 2022-03-09 LAB — BASIC METABOLIC PANEL
BUN/Creatinine Ratio: 19 (ref 12–28)
BUN: 20 mg/dL (ref 10–36)
CO2: 25 mmol/L (ref 20–29)
Calcium: 9.8 mg/dL (ref 8.7–10.3)
Chloride: 105 mmol/L (ref 96–106)
Creatinine, Ser: 1.03 mg/dL — ABNORMAL HIGH (ref 0.57–1.00)
Glucose: 81 mg/dL (ref 70–99)
Potassium: 4.3 mmol/L (ref 3.5–5.2)
Sodium: 140 mmol/L (ref 134–144)
eGFR: 51 mL/min/{1.73_m2} — ABNORMAL LOW (ref >59)

## 2022-03-09 NOTE — Progress Notes (Signed)
  [facility] Consult Note Telephone: [phone]  Fax: [phone]   Date of encounter: 03/09/22 9:53 AM PATIENT NAME: [name] [address] [place] [address]   [phone] (home)  DOB: 04/09/1928 MRN: [id number] PRIMARY CARE PROVIDER:    Rankins, Victoria R, MD,  [address] [place] [address] [phone]  REFERRING PROVIDER:   Rankins, Victoria R, MD [address] [place],  [place] [address] [phone]  RESPONSIBLE PARTY:    Contact Information     Name Relation Home Work Mobile   Pearman, Daughter [phone]  [phone]   Moore, Jackie Daughter [phone]  [phone]        I met face to face with patient in her assisted living facility. Her daughter  came in shortly after arrival. Palliative Care was asked to follow this patient by consultation request of  Rankins, Victoria R, MD to address advance care planning and complex medical decision making. This is the initial visit.          ASSESSMENT, SYMPTOM MANAGEMENT AND PLAN / RECOMMENDATIONS:    Chronic pain bilateral shoulder Tylenol 1000 mg scheduled BID, currently working with PT  2.  Candidiasis of skin Agree with Micotin powder application BID                                                                                                         Follow up Palliative Care Visit: Palliative care will continue to follow for complex medical decision making, advance care planning, and clarification of goals. Return 4 weeks or prn.    This visit was coded based on medical decision making (MDM).  PPS: 70%  HOSPICE ELIGIBILITY/DIAGNOSIS: TBD  Chief Complaint:  [facility] received a referral to follow up with patient for chronic disease management with recent hospitalization for sepsis. Palliative Care is also following for defining/refining goals of care.   HISTORY OF PRESENT  ILLNESS:  [name] is a 88 y.o. year old female with recent hospitalization for sepsis and cellulitis of RLE.  She also has HTN,  PAF on anticoagulation, brady-tachy syndrome s/p PPM, CHF, dysphagia, GERD, Aortic stenosis, cerebral microvascular disease, hypothyroidism, CKD stage 3, LE edema, She is continent of bowel and bladder, ambulates with a standing walker.  Requires assistance with bathing and is able to dress herself.  Diet is NAS nectar thick liquids.  No trouble with coughing and choking after eating or drinking.  Having pain under her arms and in shoulders for the last 2 weeks.  Denies falls, CP, SOB, nausea and vomiting, dysuria and constipation. Some redness under the pannus.  Sleeps in recliner due to back pain. Met daughter .    History obtained from review of EMR, discussion with interview with facility staff, daughter  and/or Ms. Feagans.   09/19/21 2d echo: Study Result      ECHOCARDIOGRAM REPORT     IMPRESSIONS     1. Left ventricular EF 70 to 75% with hyperdynamic function. The left ventricle has no    regional wall motion abnormalities. Severe concentric left  ventricular hypertrophy. Diastolic function  indeterminant due to severe MAC.   2. Right ventricular size and systolic function are normal. Normal pulmonary artery systolic pressure. Estimated right ventricular systolic pressure is 28.2 mmHg.   3. Left atrial size was mildly dilated.   4. The mitral valve is abnormal. Mild mitral valve regurgitation. Severe  mitral annular calcification.   5. The aortic valve is calcified. There is moderate calcification of the  aortic valve. There is moderate thickening of the aortic valve. Aortic  valve regurgitation is not visualized. Mild to moderate vs moderate aortic  valve stenosis.      02/12/22: CXR 2 view:  FINDINGS: Heart is enlarged the mediastinal contour is stable. Atherosclerotic calcification of the aorta is noted. Strandy opacities are present at  the left lung base and there is blunting of the left costophrenic angle. Mild airspace disease is noted in the right upper lobe. No pneumothorax. Degenerative changes are noted in the thoracic spine. Stable compression deformities are present in the mid and lower thoracic spine.   IMPRESSION: 1. Mild airspace disease in the right upper lobe and left lung base, possible developing pneumonia. 2. Blunting of the left costophrenic angle, possible small pleural effusion.  02/12/22:  Transabdominal US pelvis:  FINDINGS: Uterus   Measurements: Surgically absent.   Endometrium   Thickness: Surgically absent.   Right ovary   Measurements: Not identified.   Left ovary   Measurements: No normal left ovarian parenchyma identified. Vague roughly 4 cm hypoechoic area posterior to the left urinary bladder in the area of CT finding last month (series 1, image 22). No vascular elements are evident on Doppler. There is pronounced increased through transmission.   Other findings:  No pelvic free fluid.   IMPRESSION: 1. Roughly 4 cm cystic area in the left hemipelvis posterior to the urinary bladder corresponding to CT finding last month. This demonstrates no evidence of vascular elements, and is probably a simple cyst, but visualization is somewhat limited. As there is Not superior visualization today, recommend 6-12 month follow-up Ultrasound to assess growth. This recommendation follows the consensus statement: 2019 SRU Consensus Conference Update on Follow-up and Reporting. Radiology 2019; [id number].   2. Absent uterus.  Right ovary not identified.     Latest Ref Rng & Units 02/15/2022    4:44 AM 02/14/2022    8:08 AM 02/14/2022    4:15 AM  CBC  WBC 4.0 - 10.5 K/uL 6.7  9.4  9.3   Hemoglobin 12.0 - 15.0 g/dL 11.9  12.2  12.1   Hematocrit 36.0 - 46.0 % 36.4  36.8  37.3   Platelets 150 - 400 K/uL 110  95  97       Component Ref Range & Units 3 wk ago (02/15/22) 3 wk ago (02/14/22)  3 wk ago (02/13/22) 3 wk ago (02/12/22) 3 wk ago (02/12/22) 3 wk ago (02/11/22) 5 mo ago (09/24/21)  Sodium 135 - 145 mmol/L 141  139  139  138  137  139  142   Potassium 3.5 - 5.1 mmol/L 3.4 Low   3.9  3.7  3.4 Low   3.5  3.8  4.1   Chloride 98 - 111 mmol/L 109  109  108  108  106  105  107   CO2 22 - 32 mmol/L 25  26  26  22  23  23  27   Glucose, Bld 70 - 99 mg/dL 100 High     102 High  CM  99 CM  119 High  CM  116 High  CM  127 High  CM  110 High  CM   Comment: Glucose reference range applies only to samples taken after fasting for at least 8 hours.  BUN 8 - 23 mg/dL _0 32 High   32 High   29 High   15   Creatinine, Ser 0.44 - 1.00 mg/dL 0.71  0.81  0.90  1.09 High   1.17 High   1.25 High   0.93   Calcium 8.9 - 10.3 mg/dL 8.3 Low   8.4 Low   8.5 Low   8.6 Low   8.1 Low   8.9  9.7   GFR, Estimated >60 mL/min >60  >60 CM  60 Low  CM  48 Low  CM  44 Low  CM  40 Low  CM  58 Low  CM   Comment: (NOTE)  Calculated using the CKD-EPI Creatinine Equation (2021)   Anion gap 5 - _1 Low  CM  5 CM  8 CM  8 CM  11 CM  8 CM   Comment: Performed at [facility], [place] [address], [place], [place] [address]  Resulting Agency  _2  [facility] CLIN LAB [facility]   C-reactive protein elevated at 8.7 on 02/15/22   I reviewed EMR for available labs, medications, imaging, studies and related documents.  Records reviewed and summarized above.   ROS General: NAD EYES: denies vision changes ENMT: denies dysphagia Cardiovascular: denies chest pain, denies DOE Pulmonary: denies cough, denies increased SOB Abdomen: endorses good appetite, denies constipation, endorses continence of bowel GU: denies dysuria, endorses continence of urine MSK:  denies increased weakness, no falls reported Skin: Has skin sensitivity and edema of LLE, wearing support hose Neurological: endorses pain under arms and in bilateral shoulders, denies insomnia Psych:  Endorses positive mood Heme/lymph/immuno: denies bruises, abnormal bleeding  Physical Exam: Current and past weights: 190 lbs 9.6 ounces as of 03/06/22 Constitutional: NAD General: WD, obese  ENMT: intact hearing, oral mucous membranes moist, dentition intact CV: S1S2, RRR with LUSB holosytolic murmur, 1+ LLE edema, trace RLE edema Pulmonary: CTAB, no increased work of breathing, no cough, room air Abdomen:normo-active BS + 4 quadrants, soft and non tender, no ascites GU: deferred MSK: no sarcopenia, moves all extremities, ambulatory Skin: warm and dry, mild ruddy redness without increased warmth and mildly sensitive skin circumferentially on left ant shin Neuro:  no generalized weakness, noted cognitive impairment Psych: non-anxious affect, A and O x 3 Hem/lymph/immuno: no widespread bruising  CURRENT PROBLEM LIST:  Patient Active Problem List   Diagnosis Date Noted   CKD (chronic kidney disease) stage 3, GFR 30-59 ml/min (HCC) 02/22/2022   Cerebral microvascular disease 02/19/2022   Hypokalemia, inadequate intake 02/19/2022   Left leg cellulitis 02/15/2022   Sepsis ([place]) 02/12/2022   Aortic valve disorder 10/19/2021   Congestive heart failure ([place]) 10/19/2021   Eczema 10/19/2021   Other specified disorders of bone density and structure, other site 10/19/2021   Personal history of colonic polyps 10/19/2021   Pure hypertriglyceridemia 10/19/2021   Urinary incontinence 10/19/2021   Sinus node dysfunction ([place]) 10/19/2021   Esophageal dysphagia 10/19/2021   Secondary hypercoagulable state ([place]) 12/30/2019   Lower extremity edema 01/22/2018   History of total knee replacement, right 10/25/2017   Osteoarthritis  of left knee 10/25/2017   Persistent atrial fibrillation (HCC)    Hypertension    Hair loss    Aortic stenosis 03/08/2016   Chronic anticoagulation 12/17/2014   Mitral regurgitation 05/03/2014   Edema leg 04/28/2014   GERD (gastroesophageal reflux disease) 04/28/2014    Fatigue 12/26/2011   Ejection fraction    Normal nuclear stress test    Nausea    Brady-tachy syndrome (HCC)    Hypothyroidism    Paroxysmal atrial fibrillation (HCC)    Drug therapy    Drug therapy    Pacemaker    Cough    Essential hypertension, benign 07/20/2010   PAST MEDICAL HISTORY:  Active Ambulatory Problems    Diagnosis Date Noted   Essential hypertension, benign 07/20/2010   Brady-tachy syndrome (HCC)    Hypothyroidism    Paroxysmal atrial fibrillation (HCC)    Drug therapy    Drug therapy    Pacemaker    Cough    Nausea    Ejection fraction    Normal nuclear stress test    Fatigue 12/26/2011   Edema leg 04/28/2014   GERD (gastroesophageal reflux disease) 04/28/2014   Mitral regurgitation 05/03/2014   Chronic anticoagulation 12/17/2014   Aortic stenosis 03/08/2016   Persistent atrial fibrillation (HCC)    Hypertension    Hair loss    Lower extremity edema 01/22/2018   History of total knee replacement, right 10/25/2017   Osteoarthritis of left knee 10/25/2017   Secondary hypercoagulable state (HCC) 12/30/2019   Aortic valve disorder 10/19/2021   Congestive heart failure (HCC) 10/19/2021   Eczema 10/19/2021   Other specified disorders of bone density and structure, other site 10/19/2021   Personal history of colonic polyps 10/19/2021   Pure hypertriglyceridemia 10/19/2021   Urinary incontinence 10/19/2021   Sinus node dysfunction (HCC) 10/19/2021   Esophageal dysphagia 10/19/2021   Sepsis (HCC) 02/12/2022   Left leg cellulitis 02/15/2022   Cerebral microvascular disease 02/19/2022   Hypokalemia, inadequate intake 02/19/2022   CKD (chronic kidney disease) stage 3, GFR 30-59 ml/min (HCC) 02/22/2022   Resolved Ambulatory Problems    Diagnosis Date Noted   No Resolved Ambulatory Problems   No Additional Past Medical History   SOCIAL HX:  Social History   Tobacco Use   Smoking status: Former    Types: Cigarettes    Quit date: 09/06/1990     Years since quitting: 31.5   Smokeless tobacco: Never  Substance Use Topics   Alcohol use: No   FAMILY HX:  Family History  Problem Relation Age of Onset   Heart disease Father    Coronary artery disease Other    Heart disease Son        Preferred Pharmacy: ALLERGIES:  Allergies  Allergen Reactions   Morphine And Related Other (See Comments)    Patient states that she went "crazy" with this   Penicillins Swelling and Rash    Has patient had a PCN reaction causing immediate rash, facial/tongue/throat swelling, SOB or lightheadedness with hypotension: Yes Has patient had a PCN reaction causing severe rash involving mucus membranes or skin necrosis: No Has patient had a PCN reaction that required hospitalization No Has patient had a PCN reaction occurring within the last 10 years: No If all of the above answers are "NO", then may proceed with Cephalosporin use.   Lasix [Furosemide] Itching    Patient stated it made her itch all over and dry.   Aspirin     Pacemaker   Clindamycin/Lincomycin Other (  See Comments)    Any meds with mycin in the name Unknown reaction   Crestor [Rosuvastatin]     Other reaction(s): muscle pain   Diltiazem     Skin discoloration, lower extremity swelling   Erythromycin Other (See Comments)    Any meds with mycin in the name Unknown reaction   Lisinopril Other (See Comments)    REACTION: Cough   Metronidazole     Other reaction(s): mouth sores, tongue swelling   Penicillins Cross Reactors Other (See Comments)    Any meds with mycin in the name Unknown reaction     PERTINENT MEDICATIONS:  Outpatient Encounter Medications as of 03/09/2022  Medication Sig   acetaminophen (TYLENOL) 325 MG tablet Take 2 tablets (650 mg total) by mouth every 6 (six) hours as needed.   apixaban (ELIQUIS) 5 MG TABS tablet Take 1 tablet (5 mg total) by mouth 2 (two) times daily.   Apoaequorin (PREVAGEN PO) Take 1 capsule by mouth daily.   Calcium Carbonate-Vit D-Min  (CALCIUM 600+D PLUS MINERALS) 600-400 MG-UNIT TABS Take 1 tablet by mouth daily.   flecainide (TAMBOCOR) 50 MG tablet Take 1 tablet (50 mg total) by mouth 2 (two) times daily.   furosemide (LASIX) 20 MG tablet Take 1 tablet (20 mg total) by mouth daily.   levothyroxine (SYNTHROID) 100 MCG tablet Take 100 mcg by mouth every morning.   lidocaine (LIDODERM) 5 % Place 1 patch onto the skin daily as needed (for knee pain). Remove & Discard patch within 12 hours or as directed by MD   loperamide (IMODIUM A-D) 2 MG tablet Take 2 mg by mouth as needed for diarrhea or loose stools.   methocarbamol (ROBAXIN) 500 MG tablet Take 500 mg by mouth 2 (two) times daily.   metoprolol succinate (TOPROL-XL) 25 MG 24 hr tablet Take 3 tablets (75 mg total) by mouth daily.   Multiple Vitamins-Minerals (OCUVITE PO) Take 1 tablet by mouth daily.   nystatin (MYCOSTATIN/NYSTOP) powder Apply 1 Application topically 2 (two) times daily.   potassium chloride (KLOR-CON M) 10 MEQ tablet Take 1 tablet (10 mEq total) by mouth daily.   No facility-administered encounter medications on file as of 03/09/2022.      Advance Care Planning/Goals of Care: Goals include to maximize quality of life and symptom management..Our advance care planning conversation included a discussion about:    The value and importance of advance care planning -has advance directives on file Experiences with loved ones who have been seriously ill or have died  Exploration of personal, cultural or spiritual beliefs that might influence medical decisions  Exploration of goals of care in the event of a sudden injury or illness  Identification  of a healthcare agent  Review of an advance directive document -DNR. Decision not to resuscitate or to de-escalate disease focused treatments due to poor prognosis. CODE STATUS:  DNR    Thank you for the opportunity to participate in the care of Ms. Vanhecke.  The palliative care team will continue to follow. Please  call our office at [phone] if we can be of additional assistance.    A , FNP-C  COVID-19 PATIENT SCREENING TOOL Asked and negative response unless otherwise noted:  Have you had symptoms of covid, tested positive or been in contact with someone with symptoms/positive test in the past 5-10 days?  

## 2022-03-09 NOTE — Patient Instructions (Signed)
Medication Instructions:  Your physician recommends that you continue on your current medications as directed. Please refer to the Current Medication list given to you today.  *If you need a refill on your cardiac medications before your next appointment, please call your pharmacy*   Lab Work: TODAY: BMET If you have labs (blood work) drawn today and your tests are completely normal, you will receive your results only by: [place] (if you have MyChart) OR A paper copy in the mail If you have any lab test that is abnormal or we need to change your treatment, we will call you to review the results.   Follow-Up: At [facility], you and your health needs are our priority.  As part of our continuing mission to provide you with exceptional heart care, we have created designated Provider Care Teams.  These Care Teams include your primary Cardiologist (physician) and Advanced Practice Providers (APPs -  Physician Assistants and Nurse Practitioners) who all work together to provide you with the care you need, when you need it.   Your next appointment:   1 year(s)  The format for your next appointment:   In Person  Provider:   Lars Mage, MD    Important Information About Sugar

## 2022-03-09 NOTE — Progress Notes (Signed)
Electrophysiology Office Note Date: 03/09/2022  ID:  [name], DOB Aug 05, 1927, MRN [id number]  PCP: Aretta Nip, MD Primary Cardiologist: Freada Bergeron, MD Electrophysiologist: Thompson Grayer, MD -> Dr. Quentin Ore  CC: Pacemaker follow-up  [name] is a 88 y.o. female seen today for Thompson Grayer, MD for post hospital follow up.    Admitted 9/3 - 9/8 with sepsis, cellulitis, and possible UTI. Treated with ABx and discharged to SNF  Since discharge from hospital the patient reports doing well from a cardiac perspective. Seen by Gen cards earlier this week and K and lasix adjusted. She is overall feeling OK. Currently denies SOB. Edema well controlled.     Device History: St. Jude Dual Chamber PPM implanted 2009, gen change 2017 for tachy-brady syndrome  Past Medical History:  Diagnosis Date   Aortic stenosis 03/08/2016   Brady-tachy syndrome ([place])    a. Biotronik dual chamber PPM implanted 2009 b. gen change to STJ dual chamber PPM 2017   Ejection fraction    EF 70%, echo, October, 2009  //   EF 55-60%, septal dyssynergy consistent with a paced rhythm, mild to moderate mitral regurgitation, echo, November, 2015    GERD (gastroesophageal reflux disease) 04/28/2014   Episodes November, 2015 with fluid refluxing from her esophagus.    Hair loss    Patient questioned Coumadin, changed toPradaxa   Hypertension    Hypothyroidism    Lower extremity edema 01/22/2018   Mitral regurgitation 05/03/2014   Mild-to-moderate mitral regurgitation, echo, November, 2015    Osteoarthritis of left knee 10/25/2017   Paroxysmal atrial fibrillation (HCC)    Episodes rapid atrial fib noted by pacemaker interrogation, August, 2011, diltiazem added, patient improved. Patient continues on Rythmol  //   Changed to flecainide 2013  //   flecainide level checked in 2013, good level    Persistent atrial fibrillation ([place])    Past Surgical History:  Procedure Laterality Date   ABDOMINAL  HYSTERECTOMY     EP IMPLANTABLE DEVICE N/A 08/08/2015   Generator change with SJM Assurity DR PPM by Dr Rayann Heman   JOINT REPLACEMENT     PACEMAKER PLACEMENT  2009        Current Outpatient Medications  Medication Sig Dispense Refill   acetaminophen (TYLENOL) 325 MG tablet Take 2 tablets (650 mg total) by mouth every 6 (six) hours as needed. 36 tablet 0   apixaban (ELIQUIS) 5 MG TABS tablet Take 1 tablet (5 mg total) by mouth 2 (two) times daily. 180 tablet 1   Apoaequorin (PREVAGEN PO) Take 1 capsule by mouth daily.     Calcium Carbonate-Vit D-Min (CALCIUM 600+D PLUS MINERALS) 600-400 MG-UNIT TABS Take 1 tablet by mouth daily.     flecainide (TAMBOCOR) 50 MG tablet Take 1 tablet (50 mg total) by mouth 2 (two) times daily. 180 tablet 3   furosemide (LASIX) 20 MG tablet Take 1 tablet (20 mg total) by mouth daily. (Patient taking differently: Take 20 mg by mouth 2 (two) times daily.) 90 tablet 1   levothyroxine (SYNTHROID) 100 MCG tablet Take 100 mcg by mouth every morning.     lidocaine (ASPERCREME LIDOCAINE) 4 % Place 1 patch onto the skin every 12 (twelve) hours as needed.     loperamide (IMODIUM A-D) 2 MG tablet Take 2 mg by mouth as needed for diarrhea or loose stools.     methocarbamol (ROBAXIN) 500 MG tablet Take 500 mg by mouth 2 (two) times daily.  metoprolol succinate (TOPROL-XL) 25 MG 24 hr tablet Take 3 tablets (75 mg total) by mouth daily. 270 tablet 3   Multiple Vitamins-Minerals (OCUVITE PO) Take 1 tablet by mouth daily.     nystatin (MYCOSTATIN/NYSTOP) powder Apply 1 Application topically 2 (two) times daily.     potassium chloride (KLOR-CON M) 10 MEQ tablet Take 1 tablet (10 mEq total) by mouth daily. 90 tablet 3   No current facility-administered medications for this visit.    Allergies:   Morphine and related, Penicillins, Lasix [furosemide], Aspirin, Clindamycin/lincomycin, Crestor [rosuvastatin], Diltiazem, Erythromycin, Lisinopril, Metronidazole, and Penicillins cross  reactors   Social History: Social History   Socioeconomic History   Marital status: Widowed    Spouse name: Not on file   Number of children: Not on file   Years of education: Not on file   Highest education level: Not on file  Occupational History   Not on file  Tobacco Use   Smoking status: Former    Types: Cigarettes    Quit date: 09/06/1990    Years since quitting: 31.5   Smokeless tobacco: Never  Vaping Use   Vaping Use: Never used  Substance and Sexual Activity   Alcohol use: No   Drug use: No   Sexual activity: Not on file  Other Topics Concern   Not on file  Social History Narrative   Not on file   Social Determinants of Health   Financial Resource Strain: Not on file  Food Insecurity: No Food Insecurity (02/15/2022)   Hunger Vital Sign    Worried About Running Out of Food in the Last Year: Never true    Ran Out of Food in the Last Year: Never true  Transportation Needs: No Transportation Needs (02/15/2022)   PRAPARE - Hydrologist (Medical): No    Lack of Transportation (Non-Medical): No  Physical Activity: Not on file  Stress: Not on file  Social Connections: Not on file  Intimate Partner Violence: Not At Risk (02/15/2022)   Humiliation, Afraid, Rape, and Kick questionnaire    Fear of Current or Ex-Partner: No    Emotionally Abused: No    Physically Abused: No    Sexually Abused: No    Family History: Family History  Problem Relation Age of Onset   Heart disease Father    Coronary artery disease Other    Heart disease Son      Review of Systems: All other systems reviewed and are otherwise negative except as noted above.  Physical Exam: Vitals:   03/09/22 1144  BP: 128/60  Pulse: 60  Weight: 189 lb (85.7 kg)  Height: '5\' 3"'$  (1.6 m)     GEN- The patient is well appearing, alert and oriented x 3 today.   HEENT: normocephalic, atraumatic; sclera clear, conjunctiva pink; hearing intact; oropharynx clear; neck supple,  no JVP Lymph- no cervical lymphadenopathy Lungs- Clear to ausculation bilaterally, normal work of breathing.  No wheezes, rales, rhonchi Heart- Regular  rate and rhythm, no murmurs, rubs or gallops, PMI not laterally displaced GI- soft, non-tender, non-distended, bowel sounds present, no hepatosplenomegaly Extremities- no clubbing or cyanosis. Trace peripheral edema; DP/PT/radial pulses 2+ bilaterally MS- no significant deformity or atrophy Skin- warm and dry, no rash or lesion; PPM pocket well healed Psych- euthymic mood, full affect Neuro- strength and sensation are intact  PPM Interrogation-  reviewed in detail today,  See PACEART report.  EKG:  EKG is not ordered today. Personal review of ekg ordered  02/12/2022  shows NSR at 68 bpm   Recent Labs: 09/24/2021: B Natriuretic Peptide 237.1; TSH 2.159 02/11/2022: ALT 17 02/15/2022: BUN 15; Creatinine, Ser 0.71; Hemoglobin 11.9; Magnesium 2.2; Platelets 110; Potassium 3.4; Sodium 141   Wt Readings from Last 3 Encounters:  03/09/22 189 lb (85.7 kg)  03/06/22 190 lb 9.6 oz (86.5 kg)  02/27/22 191 lb (86.6 kg)     Other studies Reviewed: Additional studies/ records that were reviewed today include: Previous EP office notes, Previous remote checks, Most recent labwork.   Assessment and Plan:  1. Symptomatic bradycardia s/p St. Jude PPM  Normal PPM function See Pace Art report No changes today  2. Paroxysmal Afib Well controled with flecainide Afib burden is <1% Consider increase of flecainide to '100mg'$  BID vs amiodarone if AF increases, though she tolerated recent Sepsis episode well.  She can also take an additional toprol during AF episodes. Also follows closely with AF clinic to help reduce AF related anxiety   3. HTN Stable on current regimen    4. Chronic diastolic dysfunction Volume status stable on exam today.    5. Moderate AS Follows with TAVR team Planning for repeat echo 09/2022  Current medicines are reviewed at  length with the patient today.    Labs/ tests ordered today include:  Orders Placed This Encounter  Procedures   Basic metabolic panel     Disposition:   Follow up with Dr. Quentin Ore to establish from Dr. Rayann Heman. ( By pt request)    Signed, Shirley Friar, PA-C  03/09/2022 11:49 AM  [facility] [address] [place] [address] [phone] (office) [phone] (fax)

## 2022-03-17 IMAGING — CR DG FOOT COMPLETE 3+V*L*
3 series · 3 of 3 positions shown · non-contrast
Comparison: None.

CLINICAL DATA: Swelling and ecchymosis. Bruising of toes 2 through
4. No known injury.

EXAM:
LEFT FOOT - COMPLETE 3+ VIEW

[foot ap]
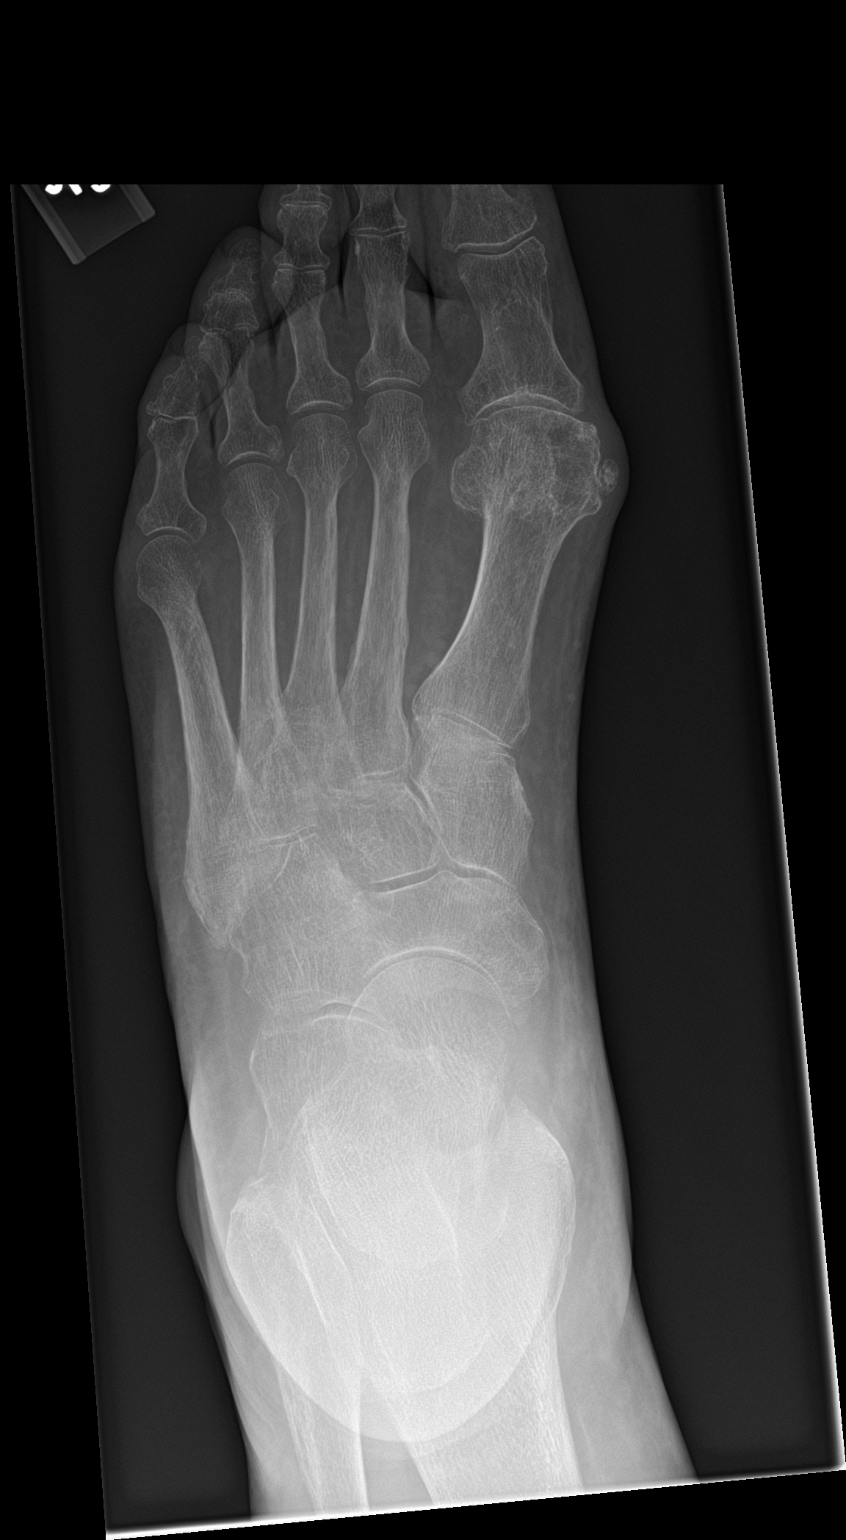

[foot obl]
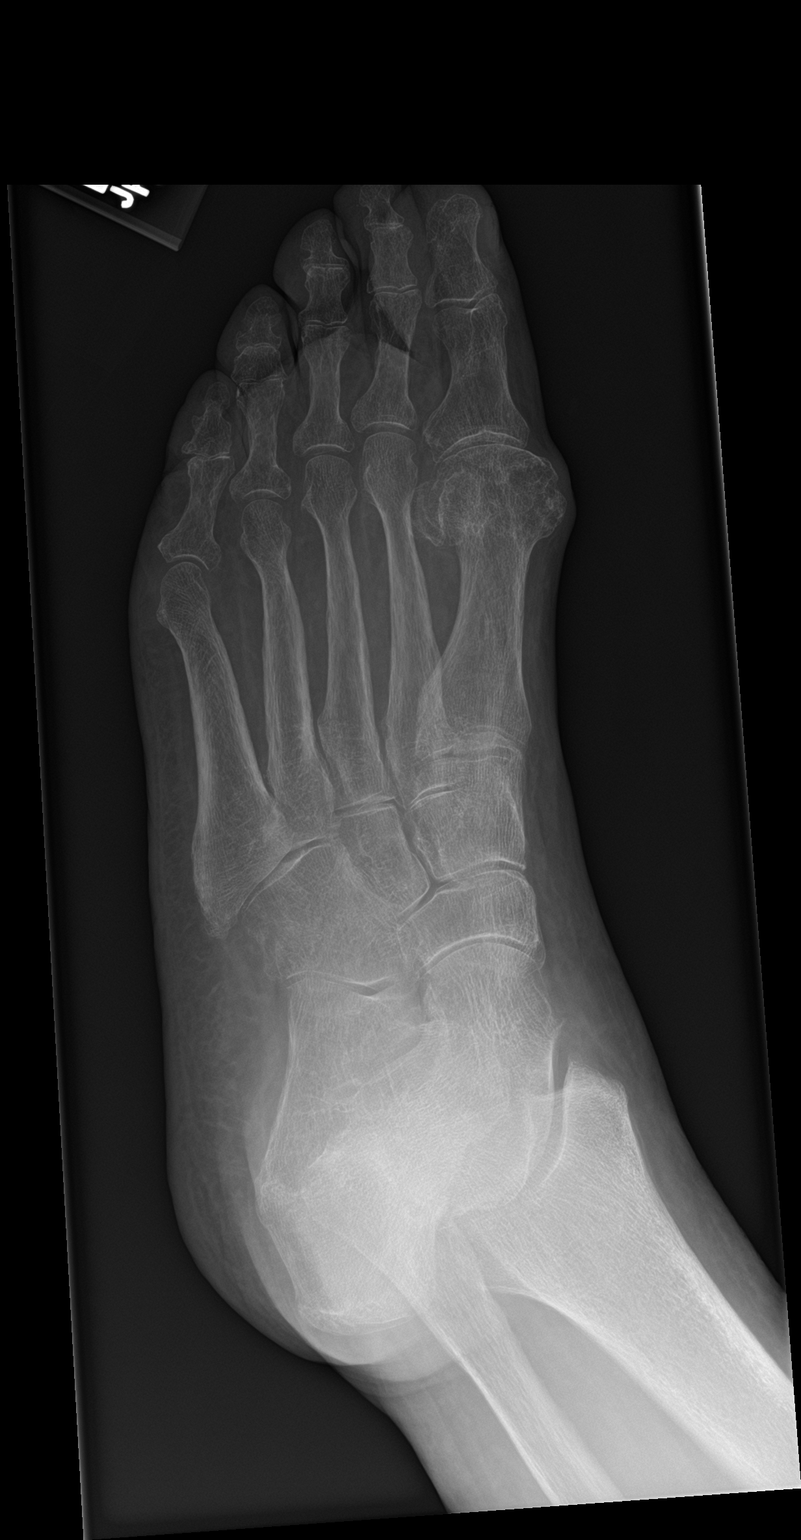

[foot lat]
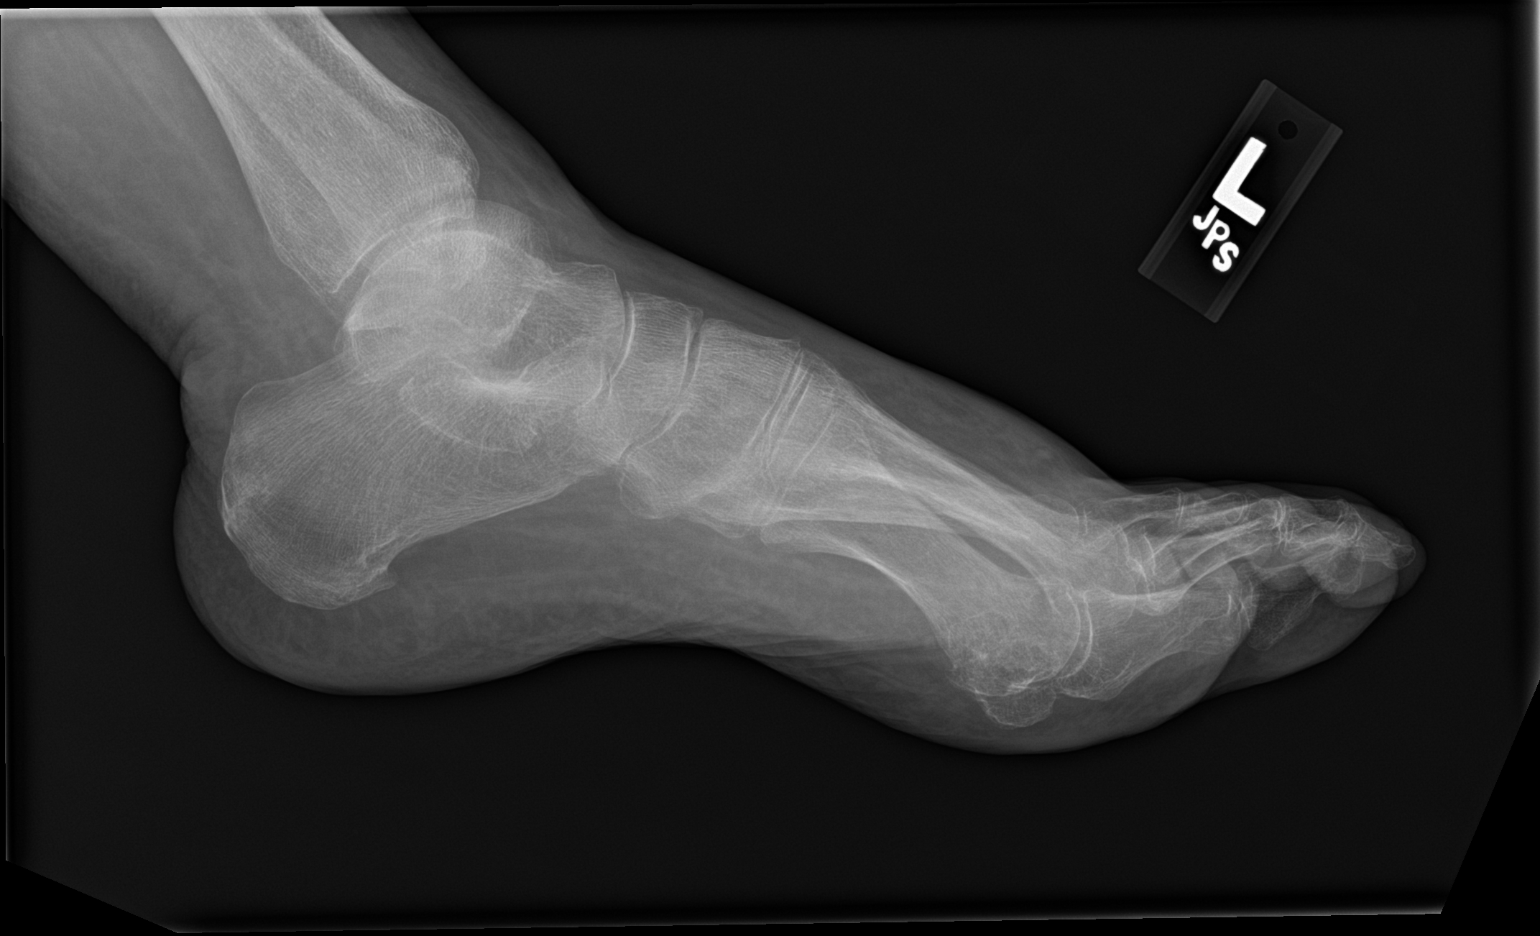

[3 of 3 positions shown; findings below may reference images not displayed]

FINDINGS: Hallux valgus with degenerative change at the first metatarsal
phalangeal joint. Alignment is otherwise maintained. No evidence of
acute fracture. No bony destruction or periosteal reaction. Small
plantar calcaneal spur. Diffuse bony under mineralization. Soft
tissue edema about the dorsal foot in the region of the metatarsals
and digits. No soft tissue air or radiopaque foreign body.
IMPRESSION: 1. Soft tissue edema without acute osseous abnormality.
2. Hallux valgus with degenerative change at the first
metatarsophalangeal joint.

## 2022-03-23 ENCOUNTER — Non-Acute Institutional Stay: Payer: Medicare Other | Admitting: Nurse Practitioner

## 2022-03-23 ENCOUNTER — Encounter: Payer: Self-pay | Admitting: Nurse Practitioner

## 2022-03-23 DIAGNOSIS — F418 Other specified anxiety disorders: Secondary | ICD-10-CM | POA: Diagnosis not present

## 2022-03-23 DIAGNOSIS — E039 Hypothyroidism, unspecified: Secondary | ICD-10-CM

## 2022-03-23 DIAGNOSIS — I495 Sick sinus syndrome: Secondary | ICD-10-CM

## 2022-03-23 DIAGNOSIS — I4819 Other persistent atrial fibrillation: Secondary | ICD-10-CM | POA: Diagnosis not present

## 2022-03-23 DIAGNOSIS — R1319 Other dysphagia: Secondary | ICD-10-CM

## 2022-03-23 DIAGNOSIS — M159 Polyosteoarthritis, unspecified: Secondary | ICD-10-CM

## 2022-03-23 DIAGNOSIS — I10 Essential (primary) hypertension: Secondary | ICD-10-CM | POA: Diagnosis not present

## 2022-03-23 DIAGNOSIS — F5105 Insomnia due to other mental disorder: Secondary | ICD-10-CM

## 2022-03-23 DIAGNOSIS — N1831 Chronic kidney disease, stage 3a: Secondary | ICD-10-CM

## 2022-03-23 DIAGNOSIS — I509 Heart failure, unspecified: Secondary | ICD-10-CM

## 2022-03-23 DIAGNOSIS — I6789 Other cerebrovascular disease: Secondary | ICD-10-CM | POA: Diagnosis not present

## 2022-03-23 NOTE — Progress Notes (Unsigned)
Location:   [facility] Room Number: [place] of Service:  [phone]) Provider: Kerrie Buffalo NP   Rankins, Bill Salinas, MD  Patient Care Team: Aretta Nip, MD as PCP - General (Family Medicine) Thompson Grayer, MD as PCP - Electrophysiology (Cardiology) Freada Bergeron, MD as PCP - Cardiology (Cardiology) Neldon Labella, RN as Case Manager  Extended Emergency Contact Information Primary Emergency Contact: Pat Kocher Address: [address], [place] Phone: [phone] Mobile Phone: [phone] Relation: Daughter Secondary Emergency Contact: Sonny Masters Home Phone: [phone] Mobile Phone: [phone] Relation: Daughter Interpreter needed? No  Code Status:  DNR Goals of care: Advanced Directive information    03/23/2022    2:50 PM  Advanced Directives  Does Patient Have a Medical Advance Directive? Yes  Type of Paramedic of [place];Living will;Out of facility DNR (pink MOST or yellow form)  Does patient want to make changes to medical advance directive? No - Patient declined  Copy of [place] in Chart? Yes - validated most recent copy scanned in chart (See row information)  Pre-existing out of facility DNR order (yellow form or pink MOST form) Yellow form placed in chart (order not valid for inpatient use)     Chief Complaint  Patient presents with   Acute Visit    Insomnia    HPI:  Pt is a 88 y.o. female seen today for an acute visit for    Past Medical History:  Diagnosis Date   Aortic stenosis 03/08/2016   Brady-tachy syndrome ([place])    a. Biotronik dual chamber PPM implanted 2009 b. gen change to STJ dual chamber PPM 2017   Ejection fraction    EF 70%, echo, October, 2009  //   EF 55-60%, septal dyssynergy consistent with a paced rhythm, mild to moderate mitral regurgitation, echo, November, 2015     GERD (gastroesophageal reflux disease) 04/28/2014   Episodes November, 2015 with fluid refluxing from her esophagus.    Hair loss    Patient questioned Coumadin, changed toPradaxa   Hypertension    Hypothyroidism    Lower extremity edema 01/22/2018   Mitral regurgitation 05/03/2014   Mild-to-moderate mitral regurgitation, echo, November, 2015    Osteoarthritis of left knee 10/25/2017   Paroxysmal atrial fibrillation (HCC)    Episodes rapid atrial fib noted by pacemaker interrogation, August, 2011, diltiazem added, patient improved. Patient continues on Rythmol  //   Changed to flecainide 2013  //   flecainide level checked in 2013, good level    Persistent atrial fibrillation ([place])    Past Surgical History:  Procedure Laterality Date   ABDOMINAL HYSTERECTOMY     EP IMPLANTABLE DEVICE N/A 08/08/2015   Generator change with SJM Assurity DR PPM by Dr Rayann Heman   JOINT REPLACEMENT     PACEMAKER PLACEMENT  2009        Allergies  Allergen Reactions   Morphine And Related Other (See Comments)    Patient states that she went "crazy" with this   Penicillins Swelling and Rash    Has patient had a PCN reaction causing immediate rash, facial/tongue/throat swelling, SOB or lightheadedness with hypotension: {Yes/No:30480221} Has patient had a PCN reaction causing severe rash involving mucus membranes or skin necrosis: {Yes/No:30480221} Has patient had a PCN reaction that required hospitalization {Yes/No:30480221} Has patient had a PCN reaction occurring within the last 10 years: {  Yes/No:30480221} If all of the above answers are "NO", then may proceed with Cephalosporin use.   Lasix [Furosemide] Itching    Patient stated it made her itch all over and dry.   Aspirin     Pacemaker   Clindamycin/Lincomycin Other (See Comments)    Any meds with mycin in the name Unknown reaction   Crestor [Rosuvastatin]     Other reaction(s): muscle pain   Diltiazem     Skin discoloration, lower extremity swelling    Erythromycin Other (See Comments)    Any meds with mycin in the name Unknown reaction   Lisinopril Other (See Comments)    REACTION: Cough   Metronidazole     Other reaction(s): mouth sores, tongue swelling   Penicillins Cross Reactors Other (See Comments)    Any meds with mycin in the name Unknown reaction    Allergies as of 03/23/2022       Reactions   Morphine And Related Other (See Comments)   Patient states that she went "crazy" with this   Penicillins Swelling, Rash   Has patient had a PCN reaction causing immediate rash, facial/tongue/throat swelling, SOB or lightheadedness with hypotension: {Yes/No:30480221} Has patient had a PCN reaction causing severe rash involving mucus membranes or skin necrosis: {Yes/No:30480221} Has patient had a PCN reaction that required hospitalization {Yes/No:30480221} Has patient had a PCN reaction occurring within the last 10 years: {Yes/No:30480221} If all of the above answers are "NO", then may proceed with Cephalosporin use.   Lasix [furosemide] Itching   Patient stated it made her itch all over and dry.   Aspirin    Pacemaker   Clindamycin/lincomycin Other (See Comments)   Any meds with mycin in the name Unknown reaction   Crestor [rosuvastatin]    Other reaction(s): muscle pain   Diltiazem    Skin discoloration, lower extremity swelling   Erythromycin Other (See Comments)   Any meds with mycin in the name Unknown reaction   Lisinopril Other (See Comments)   REACTION: Cough   Metronidazole    Other reaction(s): mouth sores, tongue swelling   Penicillins Cross Reactors Other (See Comments)   Any meds with mycin in the name Unknown reaction        Medication List        Accurate as of March 23, 2022  2:52 PM. If you have any questions, ask your nurse or doctor.          acetaminophen 325 MG tablet Commonly known as: Tylenol Take 2 tablets (650 mg total) by mouth every 6 (six) hours as needed.   apixaban 5 MG  Tabs tablet Commonly known as: Eliquis Take 1 tablet (5 mg total) by mouth 2 (two) times daily.   Aspercreme Lidocaine 4 % Generic drug: lidocaine Place 1 patch onto the skin every 12 (twelve) hours as needed.   Calcium 600+D Plus Minerals 600-400 MG-UNIT Tabs Take 1 tablet by mouth daily.   flecainide 50 MG tablet Commonly known as: TAMBOCOR Take 1 tablet (50 mg total) by mouth 2 (two) times daily.   furosemide 20 MG tablet Commonly known as: LASIX Take 1 tablet (20 mg total) by mouth daily. What changed: when to take this   levothyroxine 100 MCG tablet Commonly known as: SYNTHROID Take 100 mcg by mouth every morning.   loperamide 2 MG tablet Commonly known as: IMODIUM A-D Take 2 mg by mouth as needed for diarrhea or loose stools.   methocarbamol 500 MG tablet Commonly known as: ROBAXIN Take  500 mg by mouth 2 (two) times daily.   metoprolol succinate 25 MG 24 hr tablet Commonly known as: TOPROL-XL Take 3 tablets (75 mg total) by mouth daily.   nystatin powder Commonly known as: MYCOSTATIN/NYSTOP Apply 1 Application topically 2 (two) times daily.   OCUVITE PO Take 1 tablet by mouth daily.   potassium chloride 10 MEQ tablet Commonly known as: KLOR-CON M Take 1 tablet (10 mEq total) by mouth daily.   PREVAGEN PO Take 1 capsule by mouth daily.        Review of Systems  Immunization History  Administered Date(s) Administered   Influenza Split 03/11/2009, 03/11/2010, 02/25/2011, 02/24/2019, 03/08/2020, 03/25/2021   Influenza, High Dose Seasonal PF 03/08/2015, 04/03/2017, 03/17/2018, 03/08/2020   Influenza,inj,Quad PF,6+ Mos 03/08/2016   Influenza,inj,quad, With Preservative 03/11/2017   Moderna Covid-19 Vaccine Bivalent Booster 30yr & up 10/27/2021   Moderna SARS-COV2 Booster Vaccination 11/08/2020   Moderna Sars-Covid-2 Vaccination 06/15/2019, 07/13/2019, 04/19/2020, 02/27/2022   Pfizer Covid-19 Vaccine Bivalent Booster 153yr& up 02/28/2021    Pneumococcal Conjugate-13 12/06/2014   Pneumococcal Polysaccharide-23 05/13/2001, 03/11/2009   Polio, Unspecified 02/02/1958   Tdap 11/21/2011   Pertinent  Health Maintenance Due  Topic Date Due   INFLUENZA VACCINE  01/09/2022   DEXA SCAN  Completed      02/15/2022    9:54 AM 02/15/2022   12:03 PM 02/16/2022   12:00 AM 02/16/2022    9:45 AM 02/27/2022   11:41 AM  Fall Risk  Patient Fall Risk Level High fall risk High fall risk High fall risk High fall risk High fall risk  Patient at Risk for Falls Due to     History of fall(s)  Fall risk Follow up     Falls evaluation completed   Functional Status Survey:    Vitals:   03/23/22 1431  BP: (!) 140/62  Pulse: 85  Resp: 16  Temp: (!) 97.3 F (36.3 C)  SpO2: 94%  Weight: 189 lb (85.7 kg)  Height: '5\' 3"'$  (1.6 m)   Body mass index is 33.48 kg/m. Physical Exam  Labs reviewed: Recent Labs    09/24/21 1121 02/11/22 2351 02/14/22 0808 02/15/22 0444 03/09/22 1218  NA 142   < > 139 141 140  K 4.1   < > 3.9 3.4* 4.3  CL 107   < > 109 109 105  CO2 27   < > '26 25 25  '$ GLUCOSE 110*   < > 102* 100* 81  BUN 15   < > '19 15 20  '$ CREATININE 0.93   < > 0.81 0.71 1.03*  CALCIUM 9.7   < > 8.4* 8.3* 9.8  MG 2.0  --  2.0 2.2  --    < > = values in this interval not displayed.   Recent Labs    02/11/22 2351  AST 35  ALT 17  ALKPHOS 44  BILITOT 1.1  PROT 6.7  ALBUMIN 3.5   Recent Labs    02/11/22 2351 02/12/22 0815 02/14/22 0415 02/14/22 0808 02/15/22 0444  WBC 16.9*   < > 9.3 9.4 6.7  NEUTROABS 14.7*  --   --  7.5 4.1  HGB 13.3   < > 12.1 12.2 11.9*  HCT 41.4   < > 37.3 36.8 36.4  MCV 99.8   < > 98.9 97.9 97.6  PLT 113*   < > 97* 95* 110*   < > = values in this interval not displayed.   Lab Results  Component Value Date  TSH 2.159 09/24/2021   No results found for: "HGBA1C" Lab Results  Component Value Date   CHOL 193 05/13/2017   HDL 41 05/13/2017   LDLCALC 122 (H) 05/13/2017   TRIG 152 (H) 05/13/2017   CHOLHDL  4.7 (H) 05/13/2017    Significant Diagnostic Results in last 30 days:  No results found.  Assessment/Plan 1. Insomnia secondary to depression with anxiety ***  2. Persistent atrial fibrillation (HCC) ***  3. Primary hypertension ***  4. Brady-tachy syndrome (HCC) ***  5. Chronic congestive heart failure, unspecified heart failure type (HCC) ***  6. Stage 3a chronic kidney disease (HCC) ***  7. Esophageal dysphagia ***  8. Hypothyroidism, unspecified type ***  9. Primary osteoarthritis involving multiple joints ***  10. Cerebral microvascular disease ***    Family/ staff Communication:   Labs/tests ordered:

## 2022-03-23 NOTE — Assessment & Plan Note (Signed)
Heart rate is controlled, on Metoprolol, Flecainide, Eliquis.

## 2022-03-23 NOTE — Assessment & Plan Note (Addendum)
No difficulty falling asleep, but difficulty maintain asleep with early am awake, occurs occasionally, declined sleeping aids, will update CBC/diff, CMP/eGFR, TSH

## 2022-03-23 NOTE — Assessment & Plan Note (Signed)
Ambulates with walker,  takes Tylenol, Methocarbamol.

## 2022-03-23 NOTE — Assessment & Plan Note (Addendum)
Compensated clinically, moderate swelling, mostly in the LLE, no dorsalis pedis pulse felt, on Furosemide. EF 70-75% 09/19/21, will obtain venous US LLE, ABI R+L.

## 2022-03-23 NOTE — Assessment & Plan Note (Signed)
Blood pressure is controlled,  on Metoprolol, Furosemide, intermittently elevated Sbp, f/u Cardiology.  

## 2022-03-23 NOTE — Assessment & Plan Note (Signed)
nectar liquid 

## 2022-03-23 NOTE — Assessment & Plan Note (Signed)
Pacemaker, f/u Cardiology 

## 2022-03-23 NOTE — Assessment & Plan Note (Signed)
Bun/creat 20/1.03 03/09/22 

## 2022-03-23 NOTE — Assessment & Plan Note (Signed)
CT head 02/12/22 Mild age-related atrophy and chronic microvascular ischemic changes. Functioning well in [place] [facility], obtain MMSE

## 2022-03-23 NOTE — Progress Notes (Unsigned)
Location:   [facility] Room Number: [place] 824-A Place of Service:  ALF (13) Provider: Lennie Odor Cedric Denison NP  Rankins, Bill Salinas, MD  Patient Care Team: Aretta Nip, MD as PCP - General (Family Medicine) Thompson Grayer, MD as PCP - Electrophysiology (Cardiology) Freada Bergeron, MD as PCP - Cardiology (Cardiology) Neldon Labella, RN as Case Manager  Extended Emergency Contact Information Primary Emergency Contact: Pat Kocher Address: [address], [place] Phone: [phone] Mobile Phone: [phone] Relation: Daughter Secondary Emergency Contact: Sonny Masters Home Phone: [phone] Mobile Phone: [phone] Relation: Daughter Interpreter needed? No  Code Status: DNR Goals of care: Advanced Directive information    02/27/2022   11:41 AM  Advanced Directives  Does Patient Have a Medical Advance Directive? Yes  Type of Paramedic of [place];Living will;Out of facility DNR (pink MOST or yellow form)  Does patient want to make changes to medical advance directive? No - Patient declined  Copy of [place] in Chart? Yes - validated most recent copy scanned in chart (See row information)  Pre-existing out of facility DNR order (yellow form or pink MOST form) Yellow form placed in chart (order not valid for inpatient use)     Chief Complaint  Patient presents with   Acute Visit    Insomnia    HPI:  Pt is a 88 y.o. female seen today for an acute visit for not sleeping well, early am awake at 1am, unable to return asleep, but stated she felt sleepy, it occurs occasionally.     Afib, on Metoprolol, Flecainide, Eliquis.              AS moderate             HTN on Metoprolol, Furosemide, intermittently elevated Sbp, f/u Cardiology.              Pacemaker, f/u Cardiology             CHF, edema BLE, on Furosemide. EF 70-75% 09/19/21             CKD Bun/creat 20/1.03  03/09/22             Dysphagia, nectar liquid.              Hypothyroidism, taking Levothyroxine, TSH 2.159 09/24/21             OA, takes Tylenol, Methocarbamol.              CT head 02/12/22 Mild age-related atrophy and chronic microvascular ischemic changes.             Hypokalemia, K 4.3 03/09/22   Past Medical History:  Diagnosis Date   Aortic stenosis 03/08/2016   Brady-tachy syndrome ([place])    a. Biotronik dual chamber PPM implanted 2009 b. gen change to STJ dual chamber PPM 2017   Ejection fraction    EF 70%, echo, October, 2009  //   EF 55-60%, septal dyssynergy consistent with a paced rhythm, mild to moderate mitral regurgitation, echo, November, 2015    GERD (gastroesophageal reflux disease) 04/28/2014   Episodes November, 2015 with fluid refluxing from her esophagus.    Hair loss    Patient questioned Coumadin, changed toPradaxa   Hypertension    Hypothyroidism    Lower extremity edema 01/22/2018   Mitral regurgitation 05/03/2014   Mild-to-moderate mitral regurgitation, echo, November, 2015    Osteoarthritis  of left knee 10/25/2017   Paroxysmal atrial fibrillation (HCC)    Episodes rapid atrial fib noted by pacemaker interrogation, August, 2011, diltiazem added, patient improved. Patient continues on Rythmol  //   Changed to flecainide 2013  //   flecainide level checked in 2013, good level    Persistent atrial fibrillation ([place])    Past Surgical History:  Procedure Laterality Date   ABDOMINAL HYSTERECTOMY     EP IMPLANTABLE DEVICE N/A 08/08/2015   Generator change with S[employer] DR PPM by Dr Rayann Heman   JOINT REPLACEMENT     PACEMAKER PLACEMENT  2009        Allergies  Allergen Reactions   Morphine And Related Other (See Comments)    Patient states that she went "crazy" with this   Penicillins Swelling and Rash    Has patient had a PCN reaction causing immediate rash, facial/tongue/throat swelling, SOB or lightheadedness with hypotension: {Yes/No:30480221} Has patient  had a PCN reaction causing severe rash involving mucus membranes or skin necrosis: {Yes/No:30480221} Has patient had a PCN reaction that required hospitalization {Yes/No:30480221} Has patient had a PCN reaction occurring within the last 10 years: {Yes/No:30480221} If all of the above answers are "NO", then may proceed with Cephalosporin use.   Lasix [Furosemide] Itching    Patient stated it made her itch all over and dry.   Aspirin     Pacemaker   Clindamycin/Lincomycin Other (See Comments)    Any meds with mycin in the name Unknown reaction   Crestor [Rosuvastatin]     Other reaction(s): muscle pain   Diltiazem     Skin discoloration, lower extremity swelling   Erythromycin Other (See Comments)    Any meds with mycin in the name Unknown reaction   Lisinopril Other (See Comments)    REACTION: Cough   Metronidazole     Other reaction(s): mouth sores, tongue swelling   Penicillins Cross Reactors Other (See Comments)    Any meds with mycin in the name Unknown reaction    Allergies as of 03/23/2022       Reactions   Morphine And Related Other (See Comments)   Patient states that she went "crazy" with this   Penicillins Swelling, Rash   Has patient had a PCN reaction causing immediate rash, facial/tongue/throat swelling, SOB or lightheadedness with hypotension: {Yes/No:30480221} Has patient had a PCN reaction causing severe rash involving mucus membranes or skin necrosis: {Yes/No:30480221} Has patient had a PCN reaction that required hospitalization {Yes/No:30480221} Has patient had a PCN reaction occurring within the last 10 years: {Yes/No:30480221} If all of the above answers are "NO", then may proceed with Cephalosporin use.   Lasix [furosemide] Itching   Patient stated it made her itch all over and dry.   Aspirin    Pacemaker   Clindamycin/lincomycin Other (See Comments)   Any meds with mycin in the name Unknown reaction   Crestor [rosuvastatin]    Other reaction(s):  muscle pain   Diltiazem    Skin discoloration, lower extremity swelling   Erythromycin Other (See Comments)   Any meds with mycin in the name Unknown reaction   Lisinopril Other (See Comments)   REACTION: Cough   Metronidazole    Other reaction(s): mouth sores, tongue swelling   Penicillins Cross Reactors Other (See Comments)   Any meds with mycin in the name Unknown reaction        Medication List        Accurate as of March 23, 2022  2:42 PM.  If you have any questions, ask your nurse or doctor.          acetaminophen 325 MG tablet Commonly known as: Tylenol Take 2 tablets (650 mg total) by mouth every 6 (six) hours as needed.   apixaban 5 MG Tabs tablet Commonly known as: Eliquis Take 1 tablet (5 mg total) by mouth 2 (two) times daily.   Aspercreme Lidocaine 4 % Generic drug: lidocaine Place 1 patch onto the skin every 12 (twelve) hours as needed.   Calcium 600+D Plus Minerals 600-400 MG-UNIT Tabs Take 1 tablet by mouth daily.   flecainide 50 MG tablet Commonly known as: TAMBOCOR Take 1 tablet (50 mg total) by mouth 2 (two) times daily.   furosemide 20 MG tablet Commonly known as: LASIX Take 1 tablet (20 mg total) by mouth daily. What changed: when to take this   levothyroxine 100 MCG tablet Commonly known as: SYNTHROID Take 100 mcg by mouth every morning.   loperamide 2 MG tablet Commonly known as: IMODIUM A-D Take 2 mg by mouth as needed for diarrhea or loose stools.   methocarbamol 500 MG tablet Commonly known as: ROBAXIN Take 500 mg by mouth 2 (two) times daily.   metoprolol succinate 25 MG 24 hr tablet Commonly known as: TOPROL-XL Take 3 tablets (75 mg total) by mouth daily.   nystatin powder Commonly known as: MYCOSTATIN/NYSTOP Apply 1 Application topically 2 (two) times daily.   OCUVITE PO Take 1 tablet by mouth daily.   potassium chloride 10 MEQ tablet Commonly known as: KLOR-CON M Take 1 tablet (10 mEq total) by mouth daily.    PREVAGEN PO Take 1 capsule by mouth daily.        Review of Systems  Constitutional:  Negative for activity change, appetite change and fever.  HENT:  Positive for trouble swallowing. Negative for congestion.   Eyes:  Negative for visual disturbance.  Respiratory:  Negative for cough, shortness of breath and wheezing.   Cardiovascular:  Positive for leg swelling. Negative for chest pain and palpitations.  Gastrointestinal:  Negative for abdominal pain, constipation, nausea and vomiting.  Genitourinary:  Negative for dysuria, frequency and urgency.  Musculoskeletal:  Positive for arthralgias and gait problem.  Skin:  Negative for color change.  Neurological:  Negative for speech difficulty, weakness and headaches.       Memory lapses.   Psychiatric/Behavioral:  Positive for sleep disturbance. Negative for behavioral problems. The patient is not nervous/anxious.     Immunization History  Administered Date(s) Administered   Influenza Split 03/11/2009, 03/11/2010, 02/25/2011, 02/24/2019, 03/08/2020, 03/25/2021   Influenza, High Dose Seasonal PF 03/08/2015, 04/03/2017, 03/17/2018, 03/08/2020   Influenza,inj,Quad PF,6+ Mos 03/08/2016   Influenza,inj,quad, With Preservative 03/11/2017   Moderna Sars-Covid-2 Vaccination 06/15/2019, 07/13/2019, 04/19/2020   Pneumococcal Conjugate-13 12/06/2014   Pneumococcal Polysaccharide-23 05/13/2001, 03/11/2009   Tdap 11/21/2011   Pertinent  Health Maintenance Due  Topic Date Due   INFLUENZA VACCINE  01/09/2022   DEXA SCAN  Completed      02/15/2022    9:54 AM 02/15/2022   12:03 PM 02/16/2022   12:00 AM 02/16/2022    9:45 AM 02/27/2022   11:41 AM  Fall Risk  Patient Fall Risk Level High fall risk High fall risk High fall risk High fall risk High fall risk  Patient at Risk for Falls Due to     History of fall(s)  Fall risk Follow up     Falls evaluation completed   Functional Status Survey:  Vitals:   03/23/22 1431  BP: (!) 140/62  Pulse:  85  Resp: 16  Temp: (!) 97.3 F (36.3 C)  SpO2: 94%  Weight: 189 lb (85.7 kg)  Height: $Remove'5\' 3"'edwVZDU$  (1.6 m)   Body mass index is 33.48 kg/m. Physical Exam Vitals and nursing note reviewed.  Constitutional:      Appearance: Normal appearance.  HENT:     Head: Normocephalic and atraumatic.     Nose: Nose normal.     Mouth/Throat:     Mouth: Mucous membranes are moist.  Eyes:     Extraocular Movements: Extraocular movements intact.     Conjunctiva/sclera: Conjunctivae normal.     Pupils: Pupils are equal, round, and reactive to light.  Cardiovascular:     Rate and Rhythm: Normal rate. Rhythm irregular.     Heart sounds: Murmur heard.     Comments: No dorsalis pedis pulses felt.  Pulmonary:     Effort: Pulmonary effort is normal.     Breath sounds: No rales.  Abdominal:     General: Bowel sounds are normal.     Palpations: Abdomen is soft.     Tenderness: There is no abdominal tenderness.  Musculoskeletal:        General: Normal range of motion.     Cervical back: Normal range of motion and neck supple.     Right lower leg: Edema present.     Left lower leg: Edema present.     Comments: 2+ edema LLE, trace edema RLE  Skin:    General: Skin is warm and dry.     Findings: Erythema present.     Comments: Left lower leg mild erythema  Neurological:     General: No focal deficit present.     Mental Status: She is alert and oriented to person, place, and time. Mental status is at baseline.     Gait: Gait abnormal.  Psychiatric:        Mood and Affect: Mood normal.        Behavior: Behavior normal.        Thought Content: Thought content normal.        Judgment: Judgment normal.     Labs reviewed: Recent Labs    09/24/21 1121 02/11/22 2351 02/14/22 0808 02/15/22 0444 03/09/22 1218  NA 142   < > 139 141 140  K 4.1   < > 3.9 3.4* 4.3  CL 107   < > 109 109 105  CO2 27   < > $R'26 25 25  'JM$ GLUCOSE 110*   < > 102* 100* 81  BUN 15   < > $R'19 15 20  'op$ CREATININE 0.93   < > 0.81  0.71 1.03*  CALCIUM 9.7   < > 8.4* 8.3* 9.8  MG 2.0  --  2.0 2.2  --    < > = values in this interval not displayed.   Recent Labs    02/11/22 2351  AST 35  ALT 17  ALKPHOS 44  BILITOT 1.1  PROT 6.7  ALBUMIN 3.5   Recent Labs    02/11/22 2351 02/12/22 0815 02/14/22 0415 02/14/22 0808 02/15/22 0444  WBC 16.9*   < > 9.3 9.4 6.7  NEUTROABS 14.7*  --   --  7.5 4.1  HGB 13.3   < > 12.1 12.2 11.9*  HCT 41.4   < > 37.3 36.8 36.4  MCV 99.8   < > 98.9 97.9 97.6  PLT 113*   < > 97*  95* 110*   < > = values in this interval not displayed.   Lab Results  Component Value Date   TSH 2.159 09/24/2021   No results found for: "HGBA1C" Lab Results  Component Value Date   CHOL 193 05/13/2017   HDL 41 05/13/2017   LDLCALC 122 (H) 05/13/2017   TRIG 152 (H) 05/13/2017   CHOLHDL 4.7 (H) 05/13/2017    Significant Diagnostic Results in last 30 days:  No results found.  Assessment/Plan: Insomnia secondary to depression with anxiety No difficulty falling asleep, but difficulty maintain asleep with early am awake, occurs occasionally, declined sleeping aids, will update CBC/diff, CMP/eGFR, TSH  Persistent atrial fibrillation (HCC) Heart rate is controlled, on Metoprolol, Flecainide, Eliquis.   Hypertension Blood pressure is controlled, on Metoprolol, Furosemide, intermittently elevated Sbp, f/u Cardiology.   Brady-tachy syndrome ([place])   Pacemaker, f/u Cardiology  Congestive heart failure ([place]) Compensated clinically, moderate swelling, mostly in the LLE, no dorsalis pedis pulse felt, on Furosemide. EF 70-75% 09/19/21, will obtain venous US LLE, ABI R+L.   CKD (chronic kidney disease) stage 3, GFR 30-59 ml/min (HCC) Bun/creat 20/1.03 03/09/22  Esophageal dysphagia nectar liquid.   Hypothyroidism  taking Levothyroxine, TSH 2.159 09/24/21  Osteoarthritis, multiple sites Ambulates with walker,  takes Tylenol, Methocarbamol.   Cerebral microvascular disease  CT head 02/12/22 Mild  age-related atrophy and chronic microvascular ischemic changes. Functioning well in [facility], obtain MMSE    Family/ staff Communication: plan of care reviewed with the patient and charge nurse.   Labs/tests ordered:  CBC/diff, CMP/eGFR, TSH, venous US LLE, ABI R+L  Time spend 40 minutes.

## 2022-03-23 NOTE — Assessment & Plan Note (Signed)
taking Levothyroxine, TSH 2.159 09/24/21 

## 2022-03-26 ENCOUNTER — Encounter: Payer: Self-pay | Admitting: Nurse Practitioner

## 2022-03-26 DIAGNOSIS — G3184 Mild cognitive impairment, so stated: Secondary | ICD-10-CM | POA: Insufficient documentation

## 2022-03-27 DIAGNOSIS — I10 Essential (primary) hypertension: Secondary | ICD-10-CM | POA: Diagnosis not present

## 2022-03-27 DIAGNOSIS — M79662 Pain in left lower leg: Secondary | ICD-10-CM | POA: Diagnosis not present

## 2022-03-27 LAB — COMPREHENSIVE METABOLIC PANEL
Albumin: 3.8 (ref 3.5–5.0)
Calcium: 9.3 (ref 8.7–10.7)
Globulin: 3.2
eGFR: 73

## 2022-03-27 LAB — BASIC METABOLIC PANEL
BUN: 19 (ref 4–21)
CO2: 30 — AB (ref 13–22)
Chloride: 106 (ref 99–108)
Creatinine: 0.8 (ref 0.5–1.1)
Glucose: 95
Potassium: 4.1 mEq/L (ref 3.5–5.1)
Sodium: 142 (ref 137–147)

## 2022-03-27 LAB — HEPATIC FUNCTION PANEL
ALT: 21 U/L (ref 7–35)
AST: 28 (ref 13–35)
Alkaline Phosphatase: 53 (ref 25–125)
Bilirubin, Total: 0.7

## 2022-03-27 LAB — CBC: RBC: 4.27 (ref 3.87–5.11)

## 2022-03-27 LAB — CBC AND DIFFERENTIAL
HCT: 42 (ref 36–46)
Hemoglobin: 13.7 (ref 12.0–16.0)
Neutrophils Absolute: 2386
Platelets: 151 10*3/uL (ref 150–400)
WBC: 4.9

## 2022-03-27 LAB — TSH: TSH: 3.76 (ref 0.41–5.90)

## 2022-03-30 ENCOUNTER — Encounter: Payer: Self-pay | Admitting: Nurse Practitioner

## 2022-03-30 DIAGNOSIS — I739 Peripheral vascular disease, unspecified: Secondary | ICD-10-CM | POA: Insufficient documentation

## 2022-03-30 DIAGNOSIS — M79661 Pain in right lower leg: Secondary | ICD-10-CM | POA: Diagnosis not present

## 2022-03-30 DIAGNOSIS — M79662 Pain in left lower leg: Secondary | ICD-10-CM | POA: Diagnosis not present

## 2022-03-30 DIAGNOSIS — I7789 Other specified disorders of arteries and arterioles: Secondary | ICD-10-CM | POA: Diagnosis not present

## 2022-04-12 DIAGNOSIS — Z23 Encounter for immunization: Secondary | ICD-10-CM | POA: Diagnosis not present

## 2022-04-13 ENCOUNTER — Ambulatory Visit (INDEPENDENT_AMBULATORY_CARE_PROVIDER_SITE_OTHER): Payer: Medicare Other

## 2022-04-13 DIAGNOSIS — I495 Sick sinus syndrome: Secondary | ICD-10-CM | POA: Diagnosis not present

## 2022-04-13 LAB — CUP PACEART REMOTE DEVICE CHECK
Battery Remaining Longevity: 52 mo
Battery Remaining Percentage: 41 %
Battery Voltage: 2.96 V
Brady Statistic AP VP Percent: 1 %
Brady Statistic AP VS Percent: 97 %
Brady Statistic AS VP Percent: 1 %
Brady Statistic AS VS Percent: 1.9 %
Brady Statistic RA Percent Paced: 98 %
Brady Statistic RV Percent Paced: 1 %
Date Time Interrogation Session: 20231103040015
Implantable Lead Connection Status: 753985
Implantable Lead Connection Status: 753985
Implantable Lead Implant Date: 20091109
Implantable Lead Implant Date: 20091109
Implantable Lead Location: 753859
Implantable Lead Location: 753860
Implantable Lead Model: 350
Implantable Lead Serial Number: 24891460
Implantable Lead Serial Number: 28411861
Implantable Pulse Generator Implant Date: 20170227
Lead Channel Impedance Value: 1375 Ohm
Lead Channel Impedance Value: 510 Ohm
Lead Channel Pacing Threshold Amplitude: 0.75 V
Lead Channel Pacing Threshold Amplitude: 1 V
Lead Channel Pacing Threshold Pulse Width: 0.4 ms
Lead Channel Pacing Threshold Pulse Width: 0.4 ms
Lead Channel Sensing Intrinsic Amplitude: 0.6 mV
Lead Channel Sensing Intrinsic Amplitude: 12 mV
Lead Channel Setting Pacing Amplitude: 2 V
Lead Channel Setting Pacing Amplitude: 2.5 V
Lead Channel Setting Pacing Pulse Width: 0.4 ms
Lead Channel Setting Sensing Sensitivity: 2 mV
Pulse Gen Model: 2240
Pulse Gen Serial Number: 7885781

## 2022-04-19 ENCOUNTER — Ambulatory Visit: Payer: Medicare Other | Admitting: Cardiology

## 2022-04-23 NOTE — Progress Notes (Signed)
Remote pacemaker transmission.   

## 2022-05-09 ENCOUNTER — Non-Acute Institutional Stay: Payer: Medicare Other | Admitting: Family Medicine

## 2022-05-09 ENCOUNTER — Encounter: Payer: Self-pay | Admitting: Family Medicine

## 2022-05-09 DIAGNOSIS — R1319 Other dysphagia: Secondary | ICD-10-CM | POA: Diagnosis not present

## 2022-05-09 DIAGNOSIS — I10 Essential (primary) hypertension: Secondary | ICD-10-CM

## 2022-05-09 DIAGNOSIS — R600 Localized edema: Secondary | ICD-10-CM | POA: Diagnosis not present

## 2022-05-09 DIAGNOSIS — I495 Sick sinus syndrome: Secondary | ICD-10-CM | POA: Diagnosis not present

## 2022-05-09 DIAGNOSIS — I509 Heart failure, unspecified: Secondary | ICD-10-CM

## 2022-05-09 DIAGNOSIS — E039 Hypothyroidism, unspecified: Secondary | ICD-10-CM | POA: Diagnosis not present

## 2022-05-09 DIAGNOSIS — N1831 Chronic kidney disease, stage 3a: Secondary | ICD-10-CM | POA: Diagnosis not present

## 2022-05-09 NOTE — Progress Notes (Unsigned)
Provider:  Alain Honey, MD Location:   [facility]     PCP: Aretta Nip, MD Patient Care Team: Aretta Nip, MD as PCP - General (Family Medicine) Thompson Grayer, MD as PCP - Electrophysiology (Cardiology) Freada Bergeron, MD as PCP - Cardiology (Cardiology) Neldon Labella, RN as Case Manager  Extended Emergency Contact Information Primary Emergency Contact: Pat Kocher Address: [address], [place] Phone: [phone] Mobile Phone: [phone] Relation: Daughter Secondary Emergency Contact: Sonny Masters Home Phone: [phone] Mobile Phone: [phone] Relation: Daughter Interpreter needed? No  Code Status:  Goals of Care: Advanced Directive information    03/23/2022    2:50 PM  Advanced Directives  Does Patient Have a Medical Advance Directive? Yes  Type of Paramedic of [place];Living will;Out of facility DNR (pink MOST or yellow form)  Does patient want to make changes to medical advance directive? No - Patient declined  Copy of [place] in Chart? Yes - validated most recent copy scanned in chart (See row information)  Pre-existing out of facility DNR order (yellow form or pink MOST form) Yellow form placed in chart (order not valid for inpatient use)      No chief complaint on file.   HPI: Patient is a 88 y.o. female seen today for medical management of chronic problems including atrial fibrillation, aortic valve stenosis, stage IIIa chronic kidney disease, dysphagia, hypothyroidism, and congestive heart failure. I saw patient initially as an admission to skilled nursing after hospitalization for UTI with sepsis and possible cellulitis.  She recovered from that and has moved back to assisted living.  He seems to be doing well there.  She admits that she does have some issues with memory and recall. She has chronic back problems and  sleeps in a recliner rather than bed. She has history of A-fib and is on metoprolol flecainide and Eliquis.  Her chronic kidney disease is classified stage IIIa with a creatinine of 1.02.  She also has dysphagia.  Liquids are thickened but solid foods are normal We spent some time playing the piano.  She has obvious talent and ability to switch keys and transpose at will.  Past Medical History:  Diagnosis Date   Aortic stenosis 03/08/2016   Brady-tachy syndrome ([place])    a. Biotronik dual chamber PPM implanted 2009 b. gen change to STJ dual chamber PPM 2017   Ejection fraction    EF 70%, echo, October, 2009  //   EF 55-60%, septal dyssynergy consistent with a paced rhythm, mild to moderate mitral regurgitation, echo, November, 2015    GERD (gastroesophageal reflux disease) 04/28/2014   Episodes November, 2015 with fluid refluxing from her esophagus.    Hair loss    Patient questioned Coumadin, changed toPradaxa   Hypertension    Hypothyroidism    Lower extremity edema 01/22/2018   Mitral regurgitation 05/03/2014   Mild-to-moderate mitral regurgitation, echo, November, 2015    Osteoarthritis of left knee 10/25/2017   Paroxysmal atrial fibrillation (HCC)    Episodes rapid atrial fib noted by pacemaker interrogation, August, 2011, diltiazem added, patient improved. Patient continues on Rythmol  //   Changed to flecainide 2013  //   flecainide level checked in 2013, good level    Persistent atrial fibrillation ([place])    Past Surgical History:  Procedure Laterality Date   ABDOMINAL HYSTERECTOMY     EP IMPLANTABLE  DEVICE N/A 08/08/2015   Generator change with SJM [employer]urity DR PPM by Dr Rayann Heman   JOINT REPLACEMENT     PACEMAKER PLACEMENT  2009        reports that she quit smoking about 31 years ago. Her smoking use included cigarettes. She has never used smokeless tobacco. She reports that she does not drink alcohol and does not use drugs. Social History   Socioeconomic History   Marital  status: Widowed    Spouse name: Not on file   Number of children: Not on file   Years of education: Not on file   Highest education level: Not on file  Occupational History   Not on file  Tobacco Use   Smoking status: Former    Types: Cigarettes    Quit date: 09/06/1990    Years since quitting: 31.6   Smokeless tobacco: Never  Vaping Use   Vaping Use: Never used  Substance and Sexual Activity   Alcohol use: No   Drug use: No   Sexual activity: Not on file  Other Topics Concern   Not on file  Social History Narrative   Not on file   Social Determinants of Health   Financial Resource Strain: Not on file  Food Insecurity: No Food Insecurity (02/15/2022)   Hunger Vital Sign    Worried About Running Out of Food in the Last Year: Never true    Ran Out of Food in the Last Year: Never true  Transportation Needs: No Transportation Needs (02/15/2022)   PRAPARE - Hydrologist (Medical): No    Lack of Transportation (Non-Medical): No  Physical Activity: Not on file  Stress: Not on file  Social Connections: Not on file  Intimate Partner Violence: Not At Risk (02/15/2022)   Humiliation, Afraid, Rape, and Kick questionnaire    Fear of Current or Ex-Partner: No    Emotionally Abused: No    Physically Abused: No    Sexually Abused: No    Functional Status Survey:    Family History  Problem Relation Age of Onset   Heart disease Father    Coronary artery disease Other    Heart disease Son     Health Maintenance  Topic Date Due   Zoster Vaccines- Shingrix (1 of 2) Never done   Medicare Annual Wellness (AWV)  08/02/2021   INFLUENZA VACCINE  01/09/2022   COVID-19 Vaccine (7 - 2023-24 season) 04/24/2022   Pneumonia Vaccine 18+ Years old  Completed   DEXA SCAN  Completed   HPV VACCINES  Aged Out    Allergies  Allergen Reactions   Morphine And Related Other (See Comments)    Patient states that she went "crazy" with this   Penicillins Swelling and  Rash    Has patient had a PCN reaction causing immediate rash, facial/tongue/throat swelling, SOB or lightheadedness with hypotension: {Yes/No:30480221} Has patient had a PCN reaction causing severe rash involving mucus membranes or skin necrosis: {Yes/No:30480221} Has patient had a PCN reaction that required hospitalization {Yes/No:30480221} Has patient had a PCN reaction occurring within the last 10 years: {Yes/No:30480221} If all of the above answers are "NO", then may proceed with Cephalosporin use.   Lasix [Furosemide] Itching    Patient stated it made her itch all over and dry.   Aspirin     Pacemaker   Clindamycin/Lincomycin Other (See Comments)    Any meds with mycin in the name Unknown reaction   Crestor [Rosuvastatin]     Other  reaction(s): muscle pain   Diltiazem     Skin discoloration, lower extremity swelling   Erythromycin Other (See Comments)    Any meds with mycin in the name Unknown reaction   Lisinopril Other (See Comments)    REACTION: Cough   Metronidazole     Other reaction(s): mouth sores, tongue swelling   Penicillins Cross Reactors Other (See Comments)    Any meds with mycin in the name Unknown reaction    Outpatient Encounter Medications as of 05/09/2022  Medication Sig   acetaminophen (TYLENOL) 325 MG tablet Take 2 tablets (650 mg total) by mouth every 6 (six) hours as needed.   apixaban (ELIQUIS) 5 MG TABS tablet Take 1 tablet (5 mg total) by mouth 2 (two) times daily.   Apoaequorin (PREVAGEN PO) Take 1 capsule by mouth daily.   Calcium Carbonate-Vit D-Min (CALCIUM 600+D PLUS MINERALS) 600-400 MG-UNIT TABS Take 1 tablet by mouth daily.   flecainide (TAMBOCOR) 50 MG tablet Take 1 tablet (50 mg total) by mouth 2 (two) times daily.   furosemide (LASIX) 20 MG tablet Take 1 tablet (20 mg total) by mouth daily. (Patient taking differently: Take 20 mg by mouth 2 (two) times daily.)   levothyroxine (SYNTHROID) 100 MCG tablet Take 100 mcg by mouth every morning.    lidocaine (ASPERCREME LIDOCAINE) 4 % Place 1 patch onto the skin every 12 (twelve) hours as needed.   loperamide (IMODIUM A-D) 2 MG tablet Take 2 mg by mouth as needed for diarrhea or loose stools.   methocarbamol (ROBAXIN) 500 MG tablet Take 500 mg by mouth 2 (two) times daily.   metoprolol succinate (TOPROL-XL) 25 MG 24 hr tablet Take 3 tablets (75 mg total) by mouth daily.   Multiple Vitamins-Minerals (OCUVITE PO) Take 1 tablet by mouth daily.   nystatin (MYCOSTATIN/NYSTOP) powder Apply 1 Application topically 2 (two) times daily.   potassium chloride (KLOR-CON M) 10 MEQ tablet Take 1 tablet (10 mEq total) by mouth daily.   No facility-administered encounter medications on file as of 05/09/2022.    Review of Systems  Constitutional: Negative.   HENT: Negative.    Respiratory: Negative.    Cardiovascular: Negative.   Gastrointestinal: Negative.   Musculoskeletal:  Positive for gait problem.  Psychiatric/Behavioral: Negative.    All other systems reviewed and are negative.   There were no vitals filed for this visit. There is no height or weight on file to calculate BMI. Physical Exam Vitals and nursing note reviewed.  Constitutional:      Appearance: Normal appearance.  HENT:     Mouth/Throat:     Mouth: Mucous membranes are moist.     Pharynx: Oropharynx is clear.  Eyes:     Extraocular Movements: Extraocular movements intact.     Conjunctiva/sclera: Conjunctivae normal.     Pupils: Pupils are equal, round, and reactive to light.  Cardiovascular:     Rate and Rhythm: Normal rate and regular rhythm.     Heart sounds: Murmur heard.  Pulmonary:     Effort: Pulmonary effort is normal.     Breath sounds: Normal breath sounds.  Abdominal:     General: Bowel sounds are normal.     Palpations: Abdomen is soft.  Musculoskeletal:     Right lower leg: No edema.     Left lower leg: No edema.  Skin:    General: Skin is warm and dry.  Neurological:     General: No focal  deficit present.     Mental Status:  She is alert and oriented to person, place, and time.     Comments: Patient does very well.  She does not recall names as easily as she used to  Psychiatric:        Mood and Affect: Mood normal.        Behavior: Behavior normal.     Labs reviewed: Basic Metabolic Panel: Recent Labs    09/24/21 1121 02/11/22 2351 02/14/22 0808 02/15/22 0444 03/09/22 1218  NA 142   < > 139 141 140  K 4.1   < > 3.9 3.4* 4.3  CL 107   < > 109 109 105  CO2 27   < > '26 25 25  '$ GLUCOSE 110*   < > 102* 100* 81  BUN 15   < > '19 15 20  '$ CREATININE 0.93   < > 0.81 0.71 1.03*  CALCIUM 9.7   < > 8.4* 8.3* 9.8  MG 2.0  --  2.0 2.2  --    < > = values in this interval not displayed.   Liver Function Tests: Recent Labs    02/11/22 2351  AST 35  ALT 17  ALKPHOS 44  BILITOT 1.1  PROT 6.7  ALBUMIN 3.5   No results for input(s): "LIPASE", "AMYLASE" in the last 8760 hours. No results for input(s): "AMMONIA" in the last 8760 hours. CBC: Recent Labs    02/11/22 2351 02/12/22 0815 02/14/22 0415 02/14/22 0808 02/15/22 0444  WBC 16.9*   < > 9.3 9.4 6.7  NEUTROABS 14.7*  --   --  7.5 4.1  HGB 13.3   < > 12.1 12.2 11.9*  HCT 41.4   < > 37.3 36.8 36.4  MCV 99.8   < > 98.9 97.9 97.6  PLT 113*   < > 97* 95* 110*   < > = values in this interval not displayed.   Cardiac Enzymes: No results for input(s): "CKTOTAL", "CKMB", "CKMBINDEX", "TROPONINI" in the last 8760 hours. BNP: Invalid input(s): "POCBNP" No results found for: "HGBA1C" Lab Results  Component Value Date   TSH 2.159 09/24/2021   No results found for: "VITAMINB12" No results found for: "FOLATE" No results found for: "IRON", "TIBC", "FERRITIN"  Imaging and Procedures obtained prior to SNF admission: US PELVIS (TRANSABDOMINAL ONLY)  Result Date: 02/12/2022 CLINICAL DATA:  88 year old female with a history of hysterectomy. Left adnexa region cyst on left hip CT last month. EXAM: TRANSABDOMINAL  ULTRASOUND OF PELVIS TECHNIQUE: Transabdominal ultrasound examination of the pelvis was performed including evaluation of the uterus, ovaries, adnexal regions, and pelvic cul-de-sac. COMPARISON:  Left hip CT 01/22/2022. FINDINGS: Uterus Measurements: Surgically absent. Endometrium Thickness: Surgically absent. Right ovary Measurements: Not identified. Left ovary Measurements: No normal left ovarian parenchyma identified. Vague roughly 4 cm hypoechoic area posterior to the left urinary bladder in the area of CT finding last month (series 1, image 22). No vascular elements are evident on Doppler. There is pronounced increased through transmission. Other findings:  No pelvic free fluid. IMPRESSION: 1. Roughly 4 cm cystic area in the left hemipelvis posterior to the urinary bladder corresponding to CT finding last month. This demonstrates no evidence of vascular elements, and is probably a simple cyst, but visualization is somewhat limited. As there is Not superior visualization today, recommend 6-12 month follow-up Ultrasound to assess growth. This recommendation follows the consensus statement: 2019 SRU Consensus Conference Update on Follow-up and Reporting. Radiology 2019; [id number]. 2. Absent uterus.  Right ovary not identified. Electronically Signed   By:  Genevie Ann M.D.   On: 02/12/2022 08:31   CT Head Wo Contrast  Result Date: 02/12/2022 CLINICAL DATA:  Altered mental status. EXAM: CT HEAD WITHOUT CONTRAST TECHNIQUE: Contiguous axial images were obtained from the base of the skull through the vertex without intravenous contrast. RADIATION DOSE REDUCTION: This exam was performed according to the departmental dose-optimization program which includes automated exposure control, adjustment of the mA and/or kV according to patient size and/or use of iterative reconstruction technique. COMPARISON:  Head CT dated 01/22/2022. FINDINGS: Brain: Mild age-related atrophy and chronic microvascular ischemic changes. There is  no acute intracranial hemorrhage. No mass effect or midline shift no extra-axial fluid collection. Vascular: No hyperdense vessel or unexpected calcification. Skull: Normal. Negative for fracture or focal lesion. Sinuses/Orbits: No acute finding. Other: None IMPRESSION: 1. No acute intracranial pathology. 2. Mild age-related atrophy and chronic microvascular ischemic changes. Electronically Signed   By: Anner Crete M.D.   On: 02/12/2022 01:15   DG Chest 2 View  Result Date: 02/12/2022 CLINICAL DATA:  Suspected sepsis. Nonproductive cough for 1 week, weakness. EXAM: CHEST - 2 VIEW COMPARISON:  01/05/2021, 12/20/2019. FINDINGS: Heart is enlarged the mediastinal contour is stable. Atherosclerotic calcification of the aorta is noted. Strandy opacities are present at the left lung base and there is blunting of the left costophrenic angle. Mild airspace disease is noted in the right upper lobe. No pneumothorax. Degenerative changes are noted in the thoracic spine. Stable compression deformities are present in the mid and lower thoracic spine. IMPRESSION: 1. Mild airspace disease in the right upper lobe and left lung base, possible developing pneumonia. 2. Blunting of the left costophrenic angle, possible small pleural effusion. Electronically Signed   By: Brett Fairy M.D.   On: 02/12/2022 00:30    Assessment/Plan 1. Brady-tachy syndrome ([place]) Patient has a pacemaker and is on several rhythm and rate controlling drugs.  Asymptomatic.  Doing well  2. Stage 3a chronic kidney disease ([place]) Most recent labs show normal renal function  3. Chronic congestive heart failure, unspecified heart failure type ([place]) Denies symptoms.  Blood pressure is managed.  There is no dependent edema today  4. Edema leg Continues with furosemide 20 mg  5. Esophageal dysphagia He denies any swallowing difficulties but I do note that she is on thickened liquids  6. Essential hypertension, benign Blood pressure is  well-controlled  7. Hypothyroidism, unspecified type She takes levothyroxine and TSH is in therapeutic range.  She did complain of some hair loss today but is more likely due to aging than hypothyroid    Family/ staff Communication:   Labs/tests ordered: none  Lillette Boxer. [place], [place] [address] [place], [place] Office [phone]

## 2022-05-29 ENCOUNTER — Encounter: Payer: Self-pay | Admitting: Nurse Practitioner

## 2022-05-29 ENCOUNTER — Non-Acute Institutional Stay: Payer: Medicare Other | Admitting: Nurse Practitioner

## 2022-05-29 DIAGNOSIS — F418 Other specified anxiety disorders: Secondary | ICD-10-CM | POA: Diagnosis not present

## 2022-05-29 DIAGNOSIS — M159 Polyosteoarthritis, unspecified: Secondary | ICD-10-CM

## 2022-05-29 DIAGNOSIS — I495 Sick sinus syndrome: Secondary | ICD-10-CM

## 2022-05-29 DIAGNOSIS — I10 Essential (primary) hypertension: Secondary | ICD-10-CM

## 2022-05-29 DIAGNOSIS — E039 Hypothyroidism, unspecified: Secondary | ICD-10-CM

## 2022-05-29 DIAGNOSIS — F5105 Insomnia due to other mental disorder: Secondary | ICD-10-CM | POA: Diagnosis not present

## 2022-05-29 DIAGNOSIS — I480 Paroxysmal atrial fibrillation: Secondary | ICD-10-CM | POA: Diagnosis not present

## 2022-05-29 DIAGNOSIS — N1831 Chronic kidney disease, stage 3a: Secondary | ICD-10-CM | POA: Diagnosis not present

## 2022-05-29 DIAGNOSIS — M150 Primary generalized (osteo)arthritis: Secondary | ICD-10-CM

## 2022-05-29 NOTE — Progress Notes (Unsigned)
Location:  [facility] Room Number: NO/824/A Place of Service:  ALF (13) Provider:  Kipton Skillen X, NP   Patient Care Team: Rankins, Bill Salinas, MD as PCP - General (Family Medicine) Thompson Grayer, MD as PCP - Electrophysiology (Cardiology) Freada Bergeron, MD as PCP - Cardiology (Cardiology) Neldon Labella, RN as Case Manager  Extended Emergency Contact Information Primary Emergency Contact: Pat Kocher Address: [address], [place] Phone: [phone] Mobile Phone: [phone] Relation: Daughter Secondary Emergency Contact: Sonny Masters Home Phone: [phone] Mobile Phone: [phone] Relation: Daughter Interpreter needed? No  Code Status:  DNR Goals of care: Advanced Directive information    03/23/2022    2:50 PM  Advanced Directives  Does Patient Have a Medical Advance Directive? Yes  Type of Paramedic of [place];Living will;Out of facility DNR (pink MOST or yellow form)  Does patient want to make changes to medical advance directive? No - Patient declined  Copy of [place] in Chart? Yes - validated most recent copy scanned in chart (See row information)  Pre-existing out of facility DNR order (yellow form or pink MOST form) Yellow form placed in chart (order not valid for inpatient use)     Chief Complaint  Patient presents with   Acute Visit    Patient is being seen for anxiety    HPI:  Pt is a 88 y.o. female seen today for an acute visit for staff reported: Family is requesting resident be placed on anti-anxiety medication. The patient denied anxiety or depression episodes, stated she sleeps/eats well, enjoys activities in the facility. The patient will communicate with her family.    Afib, on Metoprolol, Flecainide, Eliquis.              AS moderate             HTN on Metoprolol, Furosemide, intermittently elevated Sbp, f/u Cardiology.               Pacemaker, f/u Cardiology             CHF, edema BLE, on Furosemide. EF 70-75% 09/19/21, 03/27/22 Venous US, negative DVT. 03/30/22 arterial US mild to moderate obstructive disease BLE             CKD Bun/creat 20/1.03 03/09/22             Dysphagia, nectar liquid.              Hypothyroidism, taking Levothyroxine, TSH 2.159 09/24/21             OA, takes Tylenol, Methocarbamol.              CT head 02/12/22 Mild age-related atrophy and chronic microvascular ischemic changes.             Hypokalemia, K 4.3 03/09/22    Past Medical History:  Diagnosis Date   Aortic stenosis 03/08/2016   Brady-tachy syndrome ([place])    a. Biotronik dual chamber PPM implanted 2009 b. gen change to STJ dual chamber PPM 2017   Ejection fraction    EF 70%, echo, October, 2009  //   EF 55-60%, septal dyssynergy consistent with a paced rhythm, mild to moderate mitral regurgitation, echo, November, 2015    GERD (gastroesophageal reflux disease) 04/28/2014   Episodes November, 2015 with fluid refluxing from her esophagus.    Hair loss    Patient questioned Coumadin, changed  toPradaxa   Hypertension    Hypothyroidism    Lower extremity edema 01/22/2018   Mitral regurgitation 05/03/2014   Mild-to-moderate mitral regurgitation, echo, November, 2015    Osteoarthritis of left knee 10/25/2017   Paroxysmal atrial fibrillation (HCC)    Episodes rapid atrial fib noted by pacemaker interrogation, August, 2011, diltiazem added, patient improved. Patient continues on Rythmol  //   Changed to flecainide 2013  //   flecainide level checked in 2013, good level    Persistent atrial fibrillation ([place])    Past Surgical History:  Procedure Laterality Date   ABDOMINAL HYSTERECTOMY     EP IMPLANTABLE DEVICE N/A 08/08/2015   Generator change with SJM Assurity DR PPM by Dr Rayann Heman   JOINT REPLACEMENT     PACEMAKER PLACEMENT  2009          Outpatient Encounter Medications as of 05/29/2022  Medication Sig   acetaminophen  (TYLENOL) 325 MG tablet Take 2 tablets (650 mg total) by mouth every 6 (six) hours as needed.   apixaban (ELIQUIS) 5 MG TABS tablet Take 1 tablet (5 mg total) by mouth 2 (two) times daily.   Apoaequorin (PREVAGEN PO) Take 1 capsule by mouth daily.   Calcium Carbonate-Vit D-Min (CALCIUM 600+D PLUS MINERALS) 600-400 MG-UNIT TABS Take 1 tablet by mouth daily.   flecainide (TAMBOCOR) 50 MG tablet Take 1 tablet (50 mg total) by mouth 2 (two) times daily.   furosemide (LASIX) 20 MG tablet Take 1 tablet (20 mg total) by mouth daily. (Patient taking differently: Take 20 mg by mouth 2 (two) times daily.)   levothyroxine (SYNTHROID) 100 MCG tablet Take 100 mcg by mouth every morning.   lidocaine (ASPERCREME LIDOCAINE) 4 % Place 1 patch onto the skin every 12 (twelve) hours as needed.   loperamide (IMODIUM A-D) 2 MG tablet Take 2 mg by mouth as needed for diarrhea or loose stools.   methocarbamol (ROBAXIN) 500 MG tablet Take 500 mg by mouth 2 (two) times daily.   metoprolol succinate (TOPROL-XL) 25 MG 24 hr tablet Take 3 tablets (75 mg total) by mouth daily.   Multiple Vitamins-Minerals (OCUVITE PO) Take 1 tablet by mouth daily.   nystatin (MYCOSTATIN/NYSTOP) powder Apply 1 Application topically 2 (two) times daily.   potassium chloride (KLOR-CON M) 10 MEQ tablet Take 1 tablet (10 mEq total) by mouth daily.   No facility-administered encounter medications on file as of 05/29/2022.    Review of Systems  Constitutional:  Negative for activity change, appetite change and fever.  HENT:  Positive for trouble swallowing. Negative for congestion.   Eyes:  Negative for visual disturbance.  Respiratory:  Negative for cough, shortness of breath and wheezing.   Cardiovascular:  Positive for leg swelling. Negative for chest pain and palpitations.  Gastrointestinal:  Negative for abdominal pain, constipation, nausea and vomiting.  Genitourinary:  Negative for dysuria, frequency and urgency.  Musculoskeletal:   Positive for arthralgias and gait problem.  Skin:  Negative for color change.  Neurological:  Negative for speech difficulty, weakness and headaches.       Memory lapses.   Psychiatric/Behavioral:  Negative for behavioral problems and sleep disturbance. The patient is not nervous/anxious.     Immunization History  Administered Date(s) Administered   Fluad Quad(high Dose 65+) 04/02/2022   Influenza Split 03/11/2009, 03/11/2010, 02/25/2011, 02/24/2019, 03/08/2020, 03/25/2021   Influenza, High Dose Seasonal PF 03/08/2015, 04/03/2017, 03/17/2018, 03/08/2020   Influenza,inj,Quad PF,6+ Mos 03/08/2016   Influenza,inj,quad, With Preservative 03/11/2017   Moderna Covid-19 Vaccine  Bivalent Booster 20yr & up 10/27/2021   Moderna SARS-COV2 Booster Vaccination 11/08/2020   Moderna Sars-Covid-2 Vaccination 06/15/2019, 07/13/2019, 04/19/2020, 02/27/2022   Pfizer Covid-19 Vaccine Bivalent Booster 138yr& up 02/28/2021, 04/12/2022   Pneumococcal Conjugate-13 12/06/2014   Pneumococcal Polysaccharide-23 05/13/2001, 03/11/2009   Polio, Unspecified 02/02/1958   Tdap 11/21/2011   Pertinent  Health Maintenance Due  Topic Date Due   INFLUENZA VACCINE  Completed   DEXA SCAN  Completed      02/15/2022    9:54 AM 02/15/2022   12:03 PM 02/16/2022   12:00 AM 02/16/2022    9:45 AM 02/27/2022   11:41 AM  Fall Risk  Patient Fall Risk Level High fall risk High fall risk High fall risk High fall risk High fall risk  Patient at Risk for Falls Due to     History of fall(s)  Fall risk Follow up     Falls evaluation completed   Functional Status Survey:    Vitals:   05/29/22 1012  BP: 132/72  Pulse: 64  Resp: 20  Temp: (!) 97.3 F (36.3 C)  SpO2: 95%  Weight: 192 lb (87.1 kg)  Height: '5\' 3"'$  (1.6 m)   Body mass index is 34.01 kg/m. Physical Exam Vitals and nursing note reviewed.  Constitutional:      Appearance: Normal appearance.  HENT:     Head: Normocephalic and atraumatic.     Nose: Nose normal.      Mouth/Throat:     Mouth: Mucous membranes are moist.  Eyes:     Extraocular Movements: Extraocular movements intact.     Conjunctiva/sclera: Conjunctivae normal.     Pupils: Pupils are equal, round, and reactive to light.  Cardiovascular:     Rate and Rhythm: Normal rate. Rhythm irregular.     Heart sounds: Murmur heard.     Comments: No dorsalis pedis pulses felt.  Pulmonary:     Effort: Pulmonary effort is normal.     Breath sounds: No rales.  Abdominal:     General: Bowel sounds are normal.     Palpations: Abdomen is soft.     Tenderness: There is no abdominal tenderness.  Musculoskeletal:        General: Normal range of motion.     Cervical back: Normal range of motion and neck supple.     Right lower leg: Edema present.     Left lower leg: Edema present.     Comments: 1+ edema LLE, trace edema RLE  Skin:    General: Skin is warm and dry.     Findings: Erythema present.     Comments: Left lower leg mild erythema  Neurological:     General: No focal deficit present.     Mental Status: She is alert and oriented to person, place, and time. Mental status is at baseline.     Gait: Gait abnormal.  Psychiatric:        Mood and Affect: Mood normal.        Behavior: Behavior normal.        Thought Content: Thought content normal.        Judgment: Judgment normal.     Labs reviewed: Recent Labs    09/24/21 1121 02/11/22 2351 02/14/22 0808 02/15/22 0444 03/09/22 1218  NA 142   < > 139 141 140  K 4.1   < > 3.9 3.4* 4.3  CL 107   < > 109 109 105  CO2 27   < > 26 25 25  GLUCOSE 110*   < > 102* 100* 81  BUN 15   < > '19 15 20  '$ CREATININE 0.93   < > 0.81 0.71 1.03*  CALCIUM 9.7   < > 8.4* 8.3* 9.8  MG 2.0  --  2.0 2.2  --    < > = values in this interval not displayed.   Recent Labs    02/11/22 2351  AST 35  ALT 17  ALKPHOS 44  BILITOT 1.1  PROT 6.7  ALBUMIN 3.5   Recent Labs    02/11/22 2351 02/12/22 0815 02/14/22 0415 02/14/22 0808 02/15/22 0444   WBC 16.9*   < > 9.3 9.4 6.7  NEUTROABS 14.7*  --   --  7.5 4.1  HGB 13.3   < > 12.1 12.2 11.9*  HCT 41.4   < > 37.3 36.8 36.4  MCV 99.8   < > 98.9 97.9 97.6  PLT 113*   < > 97* 95* 110*   < > = values in this interval not displayed.   Lab Results  Component Value Date   TSH 2.159 09/24/2021   No results found for: "HGBA1C" Lab Results  Component Value Date   CHOL 193 05/13/2017   HDL 41 05/13/2017   LDLCALC 122 (H) 05/13/2017   TRIG 152 (H) 05/13/2017   CHOLHDL 4.7 (H) 05/13/2017    Significant Diagnostic Results in last 30 days:  No results found.  Assessment/Plan Insomnia secondary to depression with anxiety Family is requesting resident be placed on anti-anxiety medication. The patient denied anxiety or depression episodes, stated she sleeps/eats well, enjoys activities in the facility. The patient will communicate with her family.   Paroxysmal atrial fibrillation (HCC) Heart rate is in control, on Metoprolol, Flecainide, Eliquis.   Essential hypertension, benign on Metoprolol, Furosemide, intermittently elevated Sbp, f/u Cardiology.   Brady-tachy syndrome ([place]) Pacemaker, f/u Cardiology  CKD (chronic kidney disease) stage 3, GFR 30-59 ml/min (HCC) Bun/creat 20/1.03 03/09/22  Hypothyroidism taking Levothyroxine, TSH 2.159 09/24/21  Osteoarthritis, multiple sites takes Tylenol, Methocarbamol.     Family/ staff Communication: plan of care reviewed with the patient and charge nurse.   Labs/tests ordered:  none  Time spend 40 minutes.

## 2022-05-30 ENCOUNTER — Encounter: Payer: Self-pay | Admitting: Nurse Practitioner

## 2022-05-30 NOTE — Assessment & Plan Note (Signed)
Heart rate is in control, on Metoprolol, Flecainide, Eliquis.

## 2022-05-30 NOTE — Assessment & Plan Note (Signed)
taking Levothyroxine, TSH 2.159 09/24/21 

## 2022-05-30 NOTE — Assessment & Plan Note (Signed)
on Metoprolol, Furosemide, intermittently elevated Sbp, f/u Cardiology.

## 2022-05-30 NOTE — Assessment & Plan Note (Signed)
Pacemaker, f/u Cardiology

## 2022-05-30 NOTE — Assessment & Plan Note (Signed)
Bun/creat 20/1.03 03/09/22

## 2022-05-30 NOTE — Assessment & Plan Note (Signed)
takes Tylenol, Methocarbamol.

## 2022-05-30 NOTE — Assessment & Plan Note (Signed)
Family is requesting resident be placed on anti-anxiety medication. The patient denied anxiety or depression episodes, stated she sleeps/eats well, enjoys activities in the facility. The patient will communicate with her family.

## 2022-06-12 DIAGNOSIS — R41841 Cognitive communication deficit: Secondary | ICD-10-CM | POA: Diagnosis not present

## 2022-06-13 DIAGNOSIS — R41841 Cognitive communication deficit: Secondary | ICD-10-CM | POA: Diagnosis not present

## 2022-06-18 DIAGNOSIS — R41841 Cognitive communication deficit: Secondary | ICD-10-CM | POA: Diagnosis not present

## 2022-06-19 ENCOUNTER — Encounter: Payer: Self-pay | Admitting: Nurse Practitioner

## 2022-06-19 ENCOUNTER — Non-Acute Institutional Stay: Payer: Medicare Other | Admitting: Nurse Practitioner

## 2022-06-19 DIAGNOSIS — G3184 Mild cognitive impairment, so stated: Secondary | ICD-10-CM | POA: Diagnosis not present

## 2022-06-19 DIAGNOSIS — E039 Hypothyroidism, unspecified: Secondary | ICD-10-CM

## 2022-06-19 DIAGNOSIS — I480 Paroxysmal atrial fibrillation: Secondary | ICD-10-CM

## 2022-06-19 DIAGNOSIS — M159 Polyosteoarthritis, unspecified: Secondary | ICD-10-CM

## 2022-06-19 DIAGNOSIS — F418 Other specified anxiety disorders: Secondary | ICD-10-CM

## 2022-06-19 DIAGNOSIS — I509 Heart failure, unspecified: Secondary | ICD-10-CM

## 2022-06-19 DIAGNOSIS — I10 Essential (primary) hypertension: Secondary | ICD-10-CM | POA: Diagnosis not present

## 2022-06-19 DIAGNOSIS — F5105 Insomnia due to other mental disorder: Secondary | ICD-10-CM

## 2022-06-19 DIAGNOSIS — N1831 Chronic kidney disease, stage 3a: Secondary | ICD-10-CM

## 2022-06-19 DIAGNOSIS — I495 Sick sinus syndrome: Secondary | ICD-10-CM

## 2022-06-19 DIAGNOSIS — M150 Primary generalized (osteo)arthritis: Secondary | ICD-10-CM

## 2022-06-19 NOTE — Progress Notes (Signed)
Location:  [facility] Room Number: AL/824/A Place of Service:  ALF (13) Provider:  Mansoor Hillyard X, NP   Patient Care Team: Rankins, Bill Salinas, MD as PCP - General (Family Medicine) Thompson Grayer, MD as PCP - Electrophysiology (Cardiology) Freada Bergeron, MD as PCP - Cardiology (Cardiology) Neldon Labella, RN as Case Manager  Extended Emergency Contact Information Primary Emergency Contact: Pat Kocher Address: [address], [place] Phone: [phone] Mobile Phone: [phone] Relation: Daughter Secondary Emergency Contact: Sonny Masters Home Phone: [phone] Mobile Phone: [phone] Relation: Daughter Interpreter needed? No  Code Status:  DNR Goals of care: Advanced Directive information    03/23/2022    2:50 PM  Advanced Directives  Does Patient Have a Medical Advance Directive? Yes  Type of Paramedic of [place];Living will;Out of facility DNR (pink MOST or yellow form)  Does patient want to make changes to medical advance directive? No - Patient declined  Copy of [place] in Chart? Yes - validated most recent copy scanned in chart (See row information)  Pre-existing out of facility DNR order (yellow form or pink MOST form) Yellow form placed in chart (order not valid for inpatient use)     Chief Complaint  Patient presents with   Acute Visit    Patient is being seen for anxiety and memory loss    HPI:  Pt is a 89 y.o. female seen today for an acute visit for anxiety, memory issues. Reported the patient feels fearful at times, calling her daughter multiple times, repetitive, denied calling her daughter at night but stated she sleeps well at night.      Afib, on Metoprolol, Flecainide, Eliquis.              AS moderate             HTN on Metoprolol, Furosemide, intermittently elevated Sbp, f/u Cardiology.              Pacemaker, f/u  Cardiology             CHF, edema BLE, on Furosemide. EF 70-75% 09/19/21, 03/27/22 Venous US, negative DVT. 03/30/22 arterial US mild to moderate obstructive disease BLE             CKD Bun/creat 20/1.03 03/09/22             Dysphagia, nectar liquid.              Hypothyroidism, taking Levothyroxine, TSH 2.159 09/24/21             OA, takes Tylenol, Methocarbamol.              CT head 02/12/22 Mild age-related atrophy and chronic microvascular ischemic changes.             Hypokalemia, K 4.3 03/09/22    Past Medical History:  Diagnosis Date   Aortic stenosis 03/08/2016   Brady-tachy syndrome ([place])    a. Biotronik dual chamber PPM implanted 2009 b. gen change to STJ dual chamber PPM 2017   Ejection fraction    EF 70%, echo, October, 2009  //   EF 55-60%, septal dyssynergy consistent with a paced rhythm, mild to moderate mitral regurgitation, echo, November, 2015    GERD (gastroesophageal reflux disease) 04/28/2014   Episodes November, 2015 with fluid refluxing from her esophagus.    Hair loss    Patient questioned Coumadin, changed  toPradaxa   Hypertension    Hypothyroidism    Lower extremity edema 01/22/2018   Mitral regurgitation 05/03/2014   Mild-to-moderate mitral regurgitation, echo, November, 2015    Osteoarthritis of left knee 10/25/2017   Paroxysmal atrial fibrillation (HCC)    Episodes rapid atrial fib noted by pacemaker interrogation, August, 2011, diltiazem added, patient improved. Patient continues on Rythmol  //   Changed to flecainide 2013  //   flecainide level checked in 2013, good level    Persistent atrial fibrillation ([place])    Past Surgical History:  Procedure Laterality Date   ABDOMINAL HYSTERECTOMY     EP IMPLANTABLE DEVICE N/A 08/08/2015   Generator change with SJM Assurity DR PPM by Dr Rayann Heman   JOINT REPLACEMENT     PACEMAKER PLACEMENT  2009          Outpatient Encounter Medications as of 06/19/2022  Medication Sig   acetaminophen (TYLENOL) 325 MG tablet Take 2  tablets (650 mg total) by mouth every 6 (six) hours as needed.   apixaban (ELIQUIS) 5 MG TABS tablet Take 1 tablet (5 mg total) by mouth 2 (two) times daily.   Apoaequorin (PREVAGEN PO) Take 1 capsule by mouth daily.   Calcium Carbonate-Vit D-Min (CALCIUM 600+D PLUS MINERALS) 600-400 MG-UNIT TABS Take 1 tablet by mouth daily.   flecainide (TAMBOCOR) 50 MG tablet Take 1 tablet (50 mg total) by mouth 2 (two) times daily.   furosemide (LASIX) 20 MG tablet Take 1 tablet (20 mg total) by mouth daily. (Patient taking differently: Take 20 mg by mouth 2 (two) times daily.)   levothyroxine (SYNTHROID) 100 MCG tablet Take 100 mcg by mouth every morning.   lidocaine (ASPERCREME LIDOCAINE) 4 % Place 1 patch onto the skin every 12 (twelve) hours as needed.   loperamide (IMODIUM A-D) 2 MG tablet Take 2 mg by mouth as needed for diarrhea or loose stools.   methocarbamol (ROBAXIN) 500 MG tablet Take 500 mg by mouth 2 (two) times daily.   metoprolol succinate (TOPROL-XL) 25 MG 24 hr tablet Take 3 tablets (75 mg total) by mouth daily.   Multiple Vitamins-Minerals (OCUVITE PO) Take 1 tablet by mouth daily.   nystatin (MYCOSTATIN/NYSTOP) powder Apply 1 Application topically 2 (two) times daily.   potassium chloride (KLOR-CON M) 10 MEQ tablet Take 1 tablet (10 mEq total) by mouth daily.   No facility-administered encounter medications on file as of 06/19/2022.    Review of Systems  Constitutional:  Negative for activity change, appetite change and fever.  HENT:  Positive for trouble swallowing. Negative for congestion.   Eyes:  Negative for visual disturbance.  Respiratory:  Negative for cough, shortness of breath and wheezing.   Cardiovascular:  Positive for leg swelling. Negative for chest pain and palpitations.  Gastrointestinal:  Negative for abdominal pain, constipation, nausea and vomiting.  Genitourinary:  Negative for dysuria, frequency and urgency.  Musculoskeletal:  Positive for arthralgias and gait  problem.  Skin:  Negative for color change.  Neurological:  Negative for speech difficulty, weakness and headaches.       Memory lapses.   Psychiatric/Behavioral:  Positive for sleep disturbance. Negative for behavioral problems and confusion. The patient is nervous/anxious.     Immunization History  Administered Date(s) Administered   Fluad Quad(high Dose 65+) 04/02/2022   Influenza Split 03/11/2009, 03/11/2010, 02/25/2011, 02/24/2019, 03/08/2020, 03/25/2021   Influenza, High Dose Seasonal PF 03/08/2015, 04/03/2017, 03/17/2018, 03/08/2020   Influenza,inj,Quad PF,6+ Mos 03/08/2016   Influenza,inj,quad, With Preservative 03/11/2017   Moderna  Covid-19 Vaccine Bivalent Booster 44yr & up 10/27/2021   Moderna SARS-COV2 Booster Vaccination 11/08/2020   Moderna Sars-Covid-2 Vaccination 06/15/2019, 07/13/2019, 04/19/2020, 02/27/2022   Pfizer Covid-19 Vaccine Bivalent Booster 148yr& up 02/28/2021, 04/12/2022   Pneumococcal Conjugate-13 12/06/2014   Pneumococcal Polysaccharide-23 05/13/2001, 03/11/2009   Polio, Unspecified 02/02/1958   Tdap 11/21/2011   Pertinent  Health Maintenance Due  Topic Date Due   INFLUENZA VACCINE  Completed   DEXA SCAN  Completed      02/15/2022    9:54 AM 02/15/2022   12:03 PM 02/16/2022   12:00 AM 02/16/2022    9:45 AM 02/27/2022   11:41 AM  Fall Risk  Patient Fall Risk Level High fall risk High fall risk High fall risk High fall risk High fall risk  Patient at Risk for Falls Due to     History of fall(s)  Fall risk Follow up     Falls evaluation completed   Functional Status Survey:    Vitals:   06/19/22 1548  BP: (!) 154/76  Pulse: 62  Resp: 18  Temp: 98.2 F (36.8 C)  SpO2: 96%  Weight: 196 lb 6.4 oz (89.1 kg)  Height: '5\' 3"'$  (1.6 m)   Body mass index is 34.79 kg/m. Physical Exam Vitals and nursing note reviewed.  Constitutional:      Appearance: Normal appearance.  HENT:     Head: Normocephalic and atraumatic.     Nose: Nose normal.      Mouth/Throat:     Mouth: Mucous membranes are moist.  Eyes:     Extraocular Movements: Extraocular movements intact.     Conjunctiva/sclera: Conjunctivae normal.     Pupils: Pupils are equal, round, and reactive to light.  Cardiovascular:     Rate and Rhythm: Normal rate. Rhythm irregular.     Heart sounds: Murmur heard.     Comments: No dorsalis pedis pulses felt.  Pulmonary:     Effort: Pulmonary effort is normal.     Breath sounds: No rales.  Abdominal:     General: Bowel sounds are normal.     Palpations: Abdomen is soft.     Tenderness: There is no abdominal tenderness.  Musculoskeletal:        General: Normal range of motion.     Cervical back: Normal range of motion and neck supple.     Right lower leg: Edema present.     Left lower leg: Edema present.     Comments: 1+ edema LLE, trace edema RLE  Skin:    General: Skin is warm and dry.     Findings: Erythema present.     Comments: Left lower leg mild erythema  Neurological:     General: No focal deficit present.     Mental Status: She is alert and oriented to person, place, and time. Mental status is at baseline.     Gait: Gait abnormal.  Psychiatric:        Mood and Affect: Mood normal.        Behavior: Behavior normal.     Labs reviewed: Recent Labs    09/24/21 1121 02/11/22 2351 02/14/22 0808 02/15/22 0444 03/09/22 1218  NA 142   < > 139 141 140  K 4.1   < > 3.9 3.4* 4.3  CL 107   < > 109 109 105  CO2 27   < > '26 25 25  '$ GLUCOSE 110*   < > 102* 100* 81  BUN 15   < > 19  15 20  CREATININE 0.93   < > 0.81 0.71 1.03*  CALCIUM 9.7   < > 8.4* 8.3* 9.8  MG 2.0  --  2.0 2.2  --    < > = values in this interval not displayed.   Recent Labs    02/11/22 2351  AST 35  ALT 17  ALKPHOS 44  BILITOT 1.1  PROT 6.7  ALBUMIN 3.5   Recent Labs    02/11/22 2351 02/12/22 0815 02/14/22 0415 02/14/22 0808 02/15/22 0444  WBC 16.9*   < > 9.3 9.4 6.7  NEUTROABS 14.7*  --   --  7.5 4.1  HGB 13.3   < > 12.1 12.2  11.9*  HCT 41.4   < > 37.3 36.8 36.4  MCV 99.8   < > 98.9 97.9 97.6  PLT 113*   < > 97* 95* 110*   < > = values in this interval not displayed.   Lab Results  Component Value Date   TSH 2.159 09/24/2021   No results found for: "HGBA1C" Lab Results  Component Value Date   CHOL 193 05/13/2017   HDL 41 05/13/2017   LDLCALC 122 (H) 05/13/2017   TRIG 152 (H) 05/13/2017   CHOLHDL 4.7 (H) 05/13/2017    Significant Diagnostic Results in last 30 days:  No results found.  Assessment/Plan Mild cognitive impairment Last MMSE 26/30 03/23/22, repeat MMSE, recommend to start Memantine starter pk, then '10mg'$  bid. CT head 02/12/22 Mild age-related atrophy and chronic microvascular ischemic changes.  Insomnia secondary to depression with anxiety anxiety, memory issues. Reported the patient feels fearful at times, calling her daughter multiple times, repetitive, denied calling her daughter at night but stated she sleeps well at night. Update CBC/diff, CMP/eGFR, TSH, may consider Mirtazapine in the future if indicated.   Paroxysmal atrial fibrillation (HCC)  on Metoprolol, Flecainide, Eliquis.   Essential hypertension, benign on Metoprolol, Furosemide, intermittently elevated Sbp, f/u Cardiology.   Brady-tachy syndrome ([place]) Pacemaker.   Congestive heart failure (HCC) edema BLE, on Furosemide. EF 70-75% 09/19/21, 03/27/22 Venous US, negative DVT. 03/30/22 arterial US mild to moderate obstructive disease BLE  CKD (chronic kidney disease) stage 3, GFR 30-59 ml/min (HCC) Bun/creat 20/1.03 03/09/22  Hypothyroidism  taking Levothyroxine, TSH 2.159 09/24/21  Osteoarthritis, multiple sites takes Tylenol, Methocarbamol.     Family/ staff Communication: plan of care reviewed with the patient, the patient's daughter HPOA, and charge nurse.   Labs/tests ordered:  CBC/diff, CMP/eGFR, TSH  Time spend 40 minutes.

## 2022-06-19 NOTE — Assessment & Plan Note (Signed)
takes Tylenol, Methocarbamol.

## 2022-06-19 NOTE — Assessment & Plan Note (Signed)
on Metoprolol, Furosemide, intermittently elevated Sbp, f/u Cardiology.

## 2022-06-19 NOTE — Assessment & Plan Note (Signed)
on Metoprolol, Flecainide, Eliquis.

## 2022-06-19 NOTE — Assessment & Plan Note (Addendum)
anxiety, memory issues. Reported the patient feels fearful at times, calling her daughter multiple times, repetitive, denied calling her daughter at night but stated she sleeps well at night. Update CBC/diff, CMP/eGFR, TSH, may consider Mirtazapine in the future if indicated.

## 2022-06-19 NOTE — Assessment & Plan Note (Signed)
edema BLE, on Furosemide. EF 70-75% 09/19/21, 03/27/22 Venous US, negative DVT. 03/30/22 arterial US mild to moderate obstructive disease BLE 

## 2022-06-19 NOTE — Assessment & Plan Note (Signed)
Bun/creat 20/1.03 03/09/22

## 2022-06-19 NOTE — Assessment & Plan Note (Addendum)
Last MMSE 26/30 03/23/22, repeat MMSE, recommend to start Memantine starter pk, then '10mg'$  bid. CT head 02/12/22 Mild age-related atrophy and chronic microvascular ischemic changes.

## 2022-06-19 NOTE — Assessment & Plan Note (Signed)
Pacemaker  

## 2022-06-19 NOTE — Assessment & Plan Note (Signed)
taking Levothyroxine, TSH 2.159 09/24/21 

## 2022-06-20 DIAGNOSIS — R41841 Cognitive communication deficit: Secondary | ICD-10-CM | POA: Diagnosis not present

## 2022-06-21 DIAGNOSIS — I10 Essential (primary) hypertension: Secondary | ICD-10-CM | POA: Diagnosis not present

## 2022-06-25 DIAGNOSIS — R41841 Cognitive communication deficit: Secondary | ICD-10-CM | POA: Diagnosis not present

## 2022-06-25 LAB — CBC AND DIFFERENTIAL
HCT: 44 (ref 36–46)
Hemoglobin: 14.6 (ref 12.0–16.0)
Neutrophils Absolute: 2279
Platelets: 153 10*3/uL (ref 150–400)
WBC: 4.9

## 2022-06-25 LAB — CBC: RBC: 4.58 (ref 3.87–5.11)

## 2022-06-25 LAB — TSH: TSH: 4.35 (ref 0.41–5.90)

## 2022-06-27 DIAGNOSIS — R41841 Cognitive communication deficit: Secondary | ICD-10-CM | POA: Diagnosis not present

## 2022-06-28 DIAGNOSIS — Z79899 Other long term (current) drug therapy: Secondary | ICD-10-CM | POA: Diagnosis not present

## 2022-06-28 LAB — COMPREHENSIVE METABOLIC PANEL
Albumin: 3.9 (ref 3.5–5.0)
Calcium: 9.5 (ref 8.7–10.7)
Globulin: 3.3
eGFR: 64

## 2022-06-28 LAB — BASIC METABOLIC PANEL
BUN: 20 (ref 4–21)
CO2: 30 — AB (ref 13–22)
Chloride: 104 (ref 99–108)
Creatinine: 0.9 (ref 0.5–1.1)
Glucose: 102
Potassium: 4.4 mEq/L (ref 3.5–5.1)
Sodium: 141 (ref 137–147)

## 2022-06-28 LAB — HEPATIC FUNCTION PANEL
ALT: 23 U/L (ref 7–35)
AST: 29 (ref 13–35)
Alkaline Phosphatase: 46 (ref 25–125)
Bilirubin, Total: 0.6

## 2022-06-29 ENCOUNTER — Encounter: Payer: Self-pay | Admitting: Nurse Practitioner

## 2022-06-29 ENCOUNTER — Non-Acute Institutional Stay: Payer: Medicare Other | Admitting: Nurse Practitioner

## 2022-06-29 DIAGNOSIS — I480 Paroxysmal atrial fibrillation: Secondary | ICD-10-CM | POA: Diagnosis not present

## 2022-06-29 DIAGNOSIS — I509 Heart failure, unspecified: Secondary | ICD-10-CM

## 2022-06-29 DIAGNOSIS — I495 Sick sinus syndrome: Secondary | ICD-10-CM | POA: Diagnosis not present

## 2022-06-29 DIAGNOSIS — N1831 Chronic kidney disease, stage 3a: Secondary | ICD-10-CM | POA: Diagnosis not present

## 2022-06-29 DIAGNOSIS — M79672 Pain in left foot: Secondary | ICD-10-CM | POA: Diagnosis not present

## 2022-06-29 DIAGNOSIS — F0390 Unspecified dementia without behavioral disturbance: Secondary | ICD-10-CM | POA: Insufficient documentation

## 2022-06-29 DIAGNOSIS — I10 Essential (primary) hypertension: Secondary | ICD-10-CM

## 2022-06-29 NOTE — Assessment & Plan Note (Signed)
Pacemaker, f/u Cardiology

## 2022-06-29 NOTE — Assessment & Plan Note (Addendum)
tolerated Namenda well, helped with her mood as well. MMSE 26/30 03/23/22,  TSH 4.35 06/21/22 

## 2022-06-29 NOTE — Assessment & Plan Note (Addendum)
Bun/creat 20/0.85 06/28/22

## 2022-06-29 NOTE — Progress Notes (Signed)
Location:   [facility] Room Number: 191-Y Place of Service:  ALF (13) Provider: Lennie Odor Geraldina Parrott NP  Rankins, Bill Salinas, MD  Patient Care Team: Aretta Nip, MD as PCP - General (Family Medicine) Thompson Grayer, MD as PCP - Electrophysiology (Cardiology) Freada Bergeron, MD as PCP - Cardiology (Cardiology) Neldon Labella, RN as Case Manager Eugenia Mcalpine Deliah Goody, FNP (Hospice and Palliative Medicine)  Extended Emergency Contact Information Primary Emergency Contact: Pat Kocher Address: [address], [place] Phone: [phone] Mobile Phone: [phone] Relation: Daughter Secondary Emergency Contact: Sonny Masters Home Phone: [phone] Mobile Phone: [phone] Relation: Daughter Interpreter needed? No  Code Status: DNR Goals of care: Advanced Directive information    06/29/2022   12:32 PM  Advanced Directives  Does Patient Have a Medical Advance Directive? Yes  Type of Paramedic of [place];Out of facility DNR (pink MOST or yellow form)  Does patient want to make changes to medical advance directive? No - Patient declined  Copy of [place] in Chart? Yes - validated most recent copy scanned in chart (See row information)     Chief Complaint  Patient presents with   Acute Visit    Left leg pain    HPI:  Pt is a 89 y.o. female seen today for an acute visit for reported the patient's c/o left lower leg pain, soreness in foot when standing, HPOA made appointment with Ortho. Hx of cellulitis. The patient stated she has no pain in feet when standing up during my examination, denied pain in the left lower leg, the warmth and swelling that the patient complained about after shopping about 2-3 days ago have resolved to her baseline.  Senile dementia, tolerated Namenda well, helped with her mood as well. MMSE 26/30 03/23/22,  TSH 4.35 06/21/22  Afib, on Metoprolol,  Flecainide, Eliquis. Hgb 14.11/09/09/24             AS moderate             HTN on Metoprolol, Furosemide, intermittently elevated Sbp, f/u Cardiology.              Pacemaker, f/u Cardiology             CHF, edema BLE, on Furosemide. EF 70-75% 09/19/21, 03/27/22 Venous US, negative DVT. 03/30/22 arterial US mild to moderate obstructive disease BLE             CKD Bun/creat 20/0.85 06/28/22             Dysphagia, nectar liquid.              Hypothyroidism, taking Levothyroxine, TSH 2.159 09/24/21             OA, takes Tylenol, Methocarbamol.              CT head 02/12/22 Mild age-related atrophy and chronic microvascular ischemic changes.             Hypokalemia, K 4.4 06/28/22          Past Medical History:  Diagnosis Date   Aortic stenosis 03/08/2016   Brady-tachy syndrome ([place])    a. Biotronik dual chamber PPM implanted 2009 b. gen change to STJ dual chamber PPM 2017   Ejection fraction    EF 70%, echo, October, 2009  //   EF 55-60%, septal dyssynergy consistent with a paced rhythm, mild to moderate mitral regurgitation, echo,  November, 2015    GERD (gastroesophageal reflux disease) 04/28/2014   Episodes November, 2015 with fluid refluxing from her esophagus.    Hair loss    Patient questioned Coumadin, changed toPradaxa   Hypertension    Hypothyroidism    Lower extremity edema 01/22/2018   Mitral regurgitation 05/03/2014   Mild-to-moderate mitral regurgitation, echo, November, 2015    Osteoarthritis of left knee 10/25/2017   Paroxysmal atrial fibrillation (HCC)    Episodes rapid atrial fib noted by pacemaker interrogation, August, 2011, diltiazem added, patient improved. Patient continues on Rythmol  //   Changed to flecainide 2013  //   flecainide level checked in 2013, good level    Persistent atrial fibrillation ([place])    Past Surgical History:  Procedure Laterality Date   ABDOMINAL HYSTERECTOMY     EP IMPLANTABLE DEVICE N/A 08/08/2015   Generator change with SJM Assurity DR PPM by  Dr Rayann Heman   JOINT REPLACEMENT     PACEMAKER PLACEMENT  2009             Review of Systems  Constitutional:  Negative for activity change, appetite change and fever.  HENT:  Positive for trouble swallowing. Negative for congestion.   Eyes:  Negative for visual disturbance.  Respiratory:  Negative for cough, shortness of breath and wheezing.   Cardiovascular:  Positive for leg swelling. Negative for chest pain and palpitations.  Gastrointestinal:  Negative for abdominal pain, constipation, nausea and vomiting.  Genitourinary:  Negative for dysuria, frequency and urgency.  Musculoskeletal:  Positive for arthralgias and gait problem.  Skin:  Negative for color change.       Chronic venous insufficiency skin changes: mild erythema in mid of R+L lower legs, weak DP pulses.   Neurological:  Negative for speech difficulty, weakness and headaches.       Memory lapses.   Psychiatric/Behavioral:  Positive for sleep disturbance. Negative for behavioral problems and confusion. The patient is nervous/anxious.     Immunization History  Administered Date(s) Administered   Fluad Quad(high Dose 65+) 04/02/2022   Influenza Split 03/11/2009, 03/11/2010, 02/25/2011, 02/24/2019, 03/08/2020, 03/25/2021   Influenza, High Dose Seasonal PF 03/08/2015, 04/03/2017, 03/17/2018, 03/08/2020   Influenza,inj,Quad PF,6+ Mos 03/08/2016   Influenza,inj,quad, With Preservative 03/11/2017   Moderna Covid-19 Vaccine Bivalent Booster 78yr & up 10/27/2021   Moderna SARS-COV2 Booster Vaccination 11/08/2020   Moderna Sars-Covid-2 Vaccination 06/15/2019, 07/13/2019, 04/19/2020, 02/27/2022   Pfizer Covid-19 Vaccine Bivalent Booster 123yr& up 02/28/2021, 04/12/2022   Pneumococcal Conjugate-13 12/06/2014   Pneumococcal Polysaccharide-23 05/13/2001, 03/11/2009   Polio, Unspecified 02/02/1958   Tdap 11/21/2011   Pertinent  Health Maintenance Due  Topic Date Due   INFLUENZA VACCINE  Completed   DEXA SCAN  Completed       02/15/2022    9:54 AM 02/15/2022   12:03 PM 02/16/2022   12:00 AM 02/16/2022    9:45 AM 02/27/2022   11:41 AM  Fall Risk  (RETIRED) Patient Fall Risk Level High fall risk High fall risk High fall risk High fall risk High fall risk  Patient at Risk for Falls Due to     History of fall(s)  Fall risk Follow up     Falls evaluation completed   Functional Status Survey:    Vitals:   06/29/22 1221  BP: (!) 156/72  Pulse: 60  Resp: 18  Temp: 97.9 F (36.6 C)  SpO2: 96%  Weight: 196 lb 6.4 oz (89.1 kg)  Height: '5\' 3"'$  (1.6 m)   Body mass  index is 34.79 kg/m. Physical Exam Vitals and nursing note reviewed.  Constitutional:      Appearance: Normal appearance.  HENT:     Head: Normocephalic and atraumatic.     Nose: Nose normal.     Mouth/Throat:     Mouth: Mucous membranes are moist.  Eyes:     Extraocular Movements: Extraocular movements intact.     Conjunctiva/sclera: Conjunctivae normal.     Pupils: Pupils are equal, round, and reactive to light.  Cardiovascular:     Rate and Rhythm: Normal rate. Rhythm irregular.     Heart sounds: Murmur heard.     Comments: No dorsalis pedis pulses felt.  Pulmonary:     Effort: Pulmonary effort is normal.     Breath sounds: No rales.  Abdominal:     General: Bowel sounds are normal.     Palpations: Abdomen is soft.     Tenderness: There is no abdominal tenderness.  Musculoskeletal:        General: Normal range of motion.     Cervical back: Normal range of motion and neck supple.     Right lower leg: Edema present.     Left lower leg: Edema present.     Comments: Trace edema LLE>RLE  Skin:    General: Skin is warm and dry.     Findings: Erythema present.     Comments: Mild dark erythema mid of R+L lower legs, chronic.   Neurological:     General: No focal deficit present.     Mental Status: She is alert and oriented to person, place, and time. Mental status is at baseline.     Gait: Gait abnormal.  Psychiatric:        Mood  and Affect: Mood normal.        Behavior: Behavior normal.     Labs reviewed: Recent Labs    09/24/21 1121 02/11/22 2351 02/14/22 0808 02/15/22 0444 03/09/22 1218 06/28/22 0000  NA 142   < > 139 141 140 141  K 4.1   < > 3.9 3.4* 4.3 4.4  CL 107   < > 109 109 105 104  CO2 27   < > '26 25 25 '$ 30*  GLUCOSE 110*   < > 102* 100* 81  --   BUN 15   < > '19 15 20 20  '$ CREATININE 0.93   < > 0.81 0.71 1.03* 0.9  CALCIUM 9.7   < > 8.4* 8.3* 9.8 9.5  MG 2.0  --  2.0 2.2  --   --    < > = values in this interval not displayed.   Recent Labs    02/11/22 2351 06/28/22 0000  AST 35 29  ALT 17 23  ALKPHOS 44 46  BILITOT 1.1  --   PROT 6.7  --   ALBUMIN 3.5 3.9   Recent Labs    02/14/22 0415 02/14/22 0808 02/15/22 0444 06/25/22 0000  WBC 9.3 9.4 6.7 4.9  NEUTROABS  --  7.5 4.1 2,279.00  HGB 12.1 12.2 11.9* 14.6  HCT 37.3 36.8 36.4 44  MCV 98.9 97.9 97.6  --   PLT 97* 95* 110* 153   Lab Results  Component Value Date   TSH 4.35 06/25/2022   No results found for: "HGBA1C" Lab Results  Component Value Date   CHOL 193 05/13/2017   HDL 41 05/13/2017   LDLCALC 122 (H) 05/13/2017   TRIG 152 (H) 05/13/2017   CHOLHDL 4.7 (H) 05/13/2017  Significant Diagnostic Results in last 30 days:  No results found.  Assessment/Plan: Osteoarthritis of left knee reported the patient's c/o left lower leg pain, soreness in foot when standing, HPOA made appointment with Ortho. Hx of cellulitis. The patient stated she has no pain in feet when standing up during my examination, denied pain in the left lower leg, the warmth and swelling that the patient complained about after shopping about 2-3 days ago have resolved to her baseline. Observe, may X-Rufus, [place] if indicated.   Senile dementia ([place]) tolerated Namenda well, helped with her mood as well. MMSE 26/30 03/23/22,  TSH 4.35 06/21/22  Paroxysmal atrial fibrillation (HCC) Heart rate is in control,  on Metoprolol, Flecainide, Eliquis.    Essential hypertension, benign Blood pressure is controlled,  on Metoprolol, Furosemide, intermittently elevated Sbp, f/u Cardiology.   Brady-tachy syndrome ([place])  Pacemaker, f/u Cardiology  Congestive heart failure (HCC) Chronic edema BLE, on Furosemide. EF 70-75% 09/19/21, 03/27/22 Venous US, negative DVT. 03/30/22 arterial US mild to moderate obstructive disease BLE  CKD (chronic kidney disease) stage 3, GFR 30-59 ml/min (HCC) Bun/creat 20/0.85 06/28/22    Family/ staff Communication: plan of care reviewed with the patient and charge nurse.   Labs/tests ordered:  none  Time spend 40 minutes.

## 2022-06-29 NOTE — Assessment & Plan Note (Signed)
Heart rate is in control,  on Metoprolol, Flecainide, Eliquis.

## 2022-06-29 NOTE — Assessment & Plan Note (Signed)
reported the patient's c/o left lower leg pain, soreness in foot when standing, HPOA made appointment with Ortho. Hx of cellulitis. The patient stated she has no pain in feet when standing up during my examination, denied pain in the left lower leg, the warmth and swelling that the patient complained about after shopping about 2-3 days ago have resolved to her baseline. Observe, may X-Anastos, [place] if indicated.

## 2022-06-29 NOTE — Assessment & Plan Note (Signed)
Chronic edema BLE, on Furosemide. EF 70-75% 09/19/21, 03/27/22 Venous US, negative DVT. 03/30/22 arterial US mild to moderate obstructive disease BLE

## 2022-06-29 NOTE — Assessment & Plan Note (Signed)
Blood pressure is controlled,  on Metoprolol, Furosemide, intermittently elevated Sbp, f/u Cardiology.

## 2022-07-02 ENCOUNTER — Encounter: Payer: Self-pay | Admitting: Nurse Practitioner

## 2022-07-02 DIAGNOSIS — R41841 Cognitive communication deficit: Secondary | ICD-10-CM | POA: Diagnosis not present

## 2022-07-03 DIAGNOSIS — R41841 Cognitive communication deficit: Secondary | ICD-10-CM | POA: Diagnosis not present

## 2022-07-05 DIAGNOSIS — I10 Essential (primary) hypertension: Secondary | ICD-10-CM | POA: Diagnosis not present

## 2022-07-05 LAB — BASIC METABOLIC PANEL
BUN: 21 (ref 4–21)
CO2: 28 — AB (ref 13–22)
Chloride: 104 (ref 99–108)
Creatinine: 0.9 (ref 0.5–1.1)
Glucose: 126
Potassium: 4 mEq/L (ref 3.5–5.1)
Sodium: 140 (ref 137–147)

## 2022-07-05 LAB — COMPREHENSIVE METABOLIC PANEL
Albumin: 3.8 (ref 3.5–5.0)
Calcium: 9.2 (ref 8.7–10.7)
Globulin: 3
eGFR: 62

## 2022-07-05 LAB — HEPATIC FUNCTION PANEL
ALT: 18 U/L (ref 7–35)
AST: 23 (ref 13–35)
Alkaline Phosphatase: 44 (ref 25–125)
Bilirubin, Total: 0.5

## 2022-07-06 ENCOUNTER — Encounter: Payer: Self-pay | Admitting: Nurse Practitioner

## 2022-07-06 ENCOUNTER — Non-Acute Institutional Stay: Payer: Medicare Other | Admitting: Nurse Practitioner

## 2022-07-06 DIAGNOSIS — E039 Hypothyroidism, unspecified: Secondary | ICD-10-CM

## 2022-07-06 DIAGNOSIS — I10 Essential (primary) hypertension: Secondary | ICD-10-CM

## 2022-07-06 DIAGNOSIS — R1319 Other dysphagia: Secondary | ICD-10-CM

## 2022-07-06 DIAGNOSIS — I495 Sick sinus syndrome: Secondary | ICD-10-CM | POA: Diagnosis not present

## 2022-07-06 DIAGNOSIS — I509 Heart failure, unspecified: Secondary | ICD-10-CM | POA: Diagnosis not present

## 2022-07-06 DIAGNOSIS — N1831 Chronic kidney disease, stage 3a: Secondary | ICD-10-CM

## 2022-07-06 DIAGNOSIS — I4819 Other persistent atrial fibrillation: Secondary | ICD-10-CM

## 2022-07-06 NOTE — Progress Notes (Unsigned)
Location:  [facility] Room Number: AL/824/A Place of Service:  ALF (13) Provider:  Jayten Gabbard X, NP  Patient Care Team: Wardell Honour, MD as PCP - General (Family Medicine) Thompson Grayer, MD as PCP - Electrophysiology (Cardiology) Freada Bergeron, MD as PCP - Cardiology (Cardiology) Neldon Labella, RN as Case Manager Eugenia Mcalpine Deliah Goody, FNP (Hospice and Palliative Medicine)  Extended Emergency Contact Information Primary Emergency Contact: Pat Kocher Address: [address], [place] Phone: [phone] Mobile Phone: [phone] Relation: Daughter Secondary Emergency Contact: Sonny Masters Home Phone: [phone] Mobile Phone: [phone] Relation: Daughter Interpreter needed? No  Code Status:  DNR Goals of care: Advanced Directive information    07/06/2022   10:03 AM  Advanced Directives  Does Patient Have a Medical Advance Directive? Yes  Type of Paramedic of [place];Out of facility DNR (pink MOST or yellow form)  Does patient want to make changes to medical advance directive? No - Patient declined  Copy of [place] in Chart? Yes - validated most recent copy scanned in chart (See row information)     Chief Complaint  Patient presents with   Medical Management of Chronic Issues    Patient is here for a follow up for chronic conditions    Quality Metric Gaps    Patient is due for AWV   Immunizations    Patient is due for Tdap vaccine, discuss need for shingrix    HPI:  Pt is a 89 y.o. female seen today for medical management of chronic diseases.     Past Medical History:  Diagnosis Date   Aortic stenosis 03/08/2016   Brady-tachy syndrome ([place])    a. Biotronik dual chamber PPM implanted 2009 b. gen change to STJ dual chamber PPM 2017   Ejection fraction    EF 70%, echo, October, 2009  //   EF 55-60%, septal dyssynergy consistent with a  paced rhythm, mild to moderate mitral regurgitation, echo, November, 2015    GERD (gastroesophageal reflux disease) 04/28/2014   Episodes November, 2015 with fluid refluxing from her esophagus.    Hair loss    Patient questioned Coumadin, changed toPradaxa   Hypertension    Hypothyroidism    Lower extremity edema 01/22/2018   Mitral regurgitation 05/03/2014   Mild-to-moderate mitral regurgitation, echo, November, 2015    Osteoarthritis of left knee 10/25/2017   Paroxysmal atrial fibrillation (HCC)    Episodes rapid atrial fib noted by pacemaker interrogation, August, 2011, diltiazem added, patient improved. Patient continues on Rythmol  //   Changed to flecainide 2013  //   flecainide level checked in 2013, good level    Persistent atrial fibrillation ([place])    Past Surgical History:  Procedure Laterality Date   ABDOMINAL HYSTERECTOMY     EP IMPLANTABLE DEVICE N/A 08/08/2015   Generator change with SJM Assurity DR PPM by Dr Rayann Heman   JOINT REPLACEMENT     PACEMAKER PLACEMENT  2009        Allergies  Allergen Reactions   Morphine And Related Other (See Comments)    Patient states that she went "crazy" with this   Penicillins Swelling and Rash    Has patient had a PCN reaction causing immediate rash, facial/tongue/throat swelling, SOB or lightheadedness with hypotension: {Yes/No:30480221} Has patient had a PCN reaction causing severe rash involving mucus membranes or skin necrosis: {Yes/No:30480221} Has patient had a PCN reaction  that required hospitalization {Yes/No:30480221} Has patient had a PCN reaction occurring within the last 10 years: {Yes/No:30480221} If all of the above answers are "NO", then may proceed with Cephalosporin use.   Lasix [Furosemide] Itching    Patient stated it made her itch all over and dry.   Aspirin     Pacemaker   Clindamycin/Lincomycin Other (See Comments)    Any meds with mycin in the name Unknown reaction   Crestor [Rosuvastatin]     Other  reaction(s): muscle pain   Diltiazem     Skin discoloration, lower extremity swelling   Erythromycin Other (See Comments)    Any meds with mycin in the name Unknown reaction   Lisinopril Other (See Comments)    REACTION: Cough   Metronidazole     Other reaction(s): mouth sores, tongue swelling   Penicillins Cross Reactors Other (See Comments)    Any meds with mycin in the name Unknown reaction    Outpatient Encounter Medications as of 07/06/2022  Medication Sig   acetaminophen (TYLENOL) 325 MG tablet Take 2 tablets (650 mg total) by mouth every 6 (six) hours as needed.   acetaminophen (TYLENOL) 500 MG tablet **DAW** Give 1000 mg by mouth two times a day for Pain   apixaban (ELIQUIS) 5 MG TABS tablet Take 1 tablet (5 mg total) by mouth 2 (two) times daily.   Apoaequorin (PREVAGEN PO) Take 1 capsule by mouth daily.   betamethasone, augmented, (DIPROLENE) 0.05 % gel Apply to scalp topically every 12 hours as needed for psoriasis related to PSORIASIS, UNSPECIFIED (L40.9) apply to affected area of scalp   Calcium Carbonate-Vit D-Min (CALCIUM 600+D PLUS MINERALS) 600-400 MG-UNIT TABS Take 1 tablet by mouth daily.   Carboxymethylcell-Glycerin PF (REFRESH RELIEVA PF) 0.5-1 % SOLN Apply 1 drop to eye in the morning and at bedtime. for relieve discomfort   flecainide (TAMBOCOR) 50 MG tablet Take 1 tablet (50 mg total) by mouth 2 (two) times daily.   furosemide (LASIX) 20 MG tablet Take 1 tablet (20 mg total) by mouth daily.   hydrocortisone 2.5 % ointment Apply to affected areas topically as needed for Rashy/itchy areas two times daily-MAY SELF ADMINISTER-per pt request   levothyroxine (SYNTHROID) 100 MCG tablet Take 100 mcg by mouth every morning.   memantine (NAMENDA) 10 MG tablet Take 10 mg by mouth 2 (two) times daily. for cognitive decline   memantine (NAMENDA) 5 MG tablet Take 5 mg by mouth daily. for cognitive decline for 7 Days   methocarbamol (ROBAXIN) 500 MG tablet Take 500 mg by mouth 2  (two) times daily. (Patient not taking: Reported on 06/29/2022)   metoprolol succinate (TOPROL-XL) 25 MG 24 hr tablet Take 3 tablets (75 mg total) by mouth daily.   Multiple Vitamins-Minerals (Hadar) CAPS Take by mouth. Give 1 capsule by mouth one time a day for NUTRIENTS   Multiple Vitamins-Minerals (OCUVITE PO) Take 1 tablet by mouth daily.   potassium chloride (KLOR-CON M) 10 MEQ tablet Take 1 tablet (10 mEq total) by mouth daily.   sodium chloride (MURO 128) 5 % ophthalmic ointment Place 1 Application into both eyes at bedtime.  for reduce swelling At least 1 hour after other drops   sodium chloride (MURO 128) 5 % ophthalmic solution Place 1 drop into both eyes in the morning and at bedtime. for decrease swelling Wait 1 hour between these and Refresh   No facility-administered encounter medications on file as of 07/06/2022.    Review of Systems  Immunization History  Administered Date(s) Administered   Fluad Quad(high Dose 65+) 04/02/2022   Influenza Split 03/11/2009, 03/11/2010, 02/25/2011, 02/24/2019, 03/08/2020, 03/25/2021   Influenza, High Dose Seasonal PF 03/08/2015, 04/03/2017, 03/17/2018, 03/08/2020   Influenza,inj,Quad PF,6+ Mos 03/08/2016   Influenza,inj,quad, With Preservative 03/11/2017   Moderna Covid-19 Vaccine Bivalent Booster 62yr & up 10/27/2021   Moderna SARS-COV2 Booster Vaccination 11/08/2020   Moderna Sars-Covid-2 Vaccination 06/15/2019, 07/13/2019, 04/19/2020, 02/27/2022   Pfizer Covid-19 Vaccine Bivalent Booster 154yr& up 02/28/2021, 04/12/2022   Pneumococcal Conjugate-13 12/06/2014   Pneumococcal Polysaccharide-23 05/13/2001, 03/11/2009   Polio, Unspecified 02/02/1958   Tdap 11/21/2011   Pertinent  Health Maintenance Due  Topic Date Due   INFLUENZA VACCINE  Completed   DEXA SCAN  Completed      02/15/2022   12:03 PM 02/16/2022   12:00 AM 02/16/2022    9:45 AM 02/27/2022   11:41 AM 07/06/2022   10:04 AM  Fall Risk  Falls in the past  year?     0  Was there an injury with Fall?     0  Fall Risk Category Calculator     0  (RETIRED) Patient Fall Risk Level High fall risk High fall risk High fall risk High fall risk   Patient at Risk for Falls Due to    History of fall(s) No Fall Risks  Fall risk Follow up    Falls evaluation completed    Functional Status Survey:    Vitals:   07/06/22 1004  BP: (!) 142/68  Pulse: 66  Resp: 18  Temp: 98.2 F (36.8 C)  SpO2: 97%  Weight: 196 lb 6.4 oz (89.1 kg)  Height: '5\' 3"'$  (1.6 m)   Body mass index is 34.79 kg/m. Physical Exam  Labs reviewed: Recent Labs    09/24/21 1121 02/11/22 2351 02/14/22 0808 02/15/22 0444 03/09/22 1218 06/28/22 0000  NA 142   < > 139 141 140 141  K 4.1   < > 3.9 3.4* 4.3 4.4  CL 107   < > 109 109 105 104  CO2 27   < > '26 25 25 '$ 30*  GLUCOSE 110*   < > 102* 100* 81  --   BUN 15   < > '19 15 20 20  '$ CREATININE 0.93   < > 0.81 0.71 1.03* 0.9  CALCIUM 9.7   < > 8.4* 8.3* 9.8 9.5  MG 2.0  --  2.0 2.2  --   --    < > = values in this interval not displayed.   Recent Labs    02/11/22 2351 06/28/22 0000  AST 35 29  ALT 17 23  ALKPHOS 44 46  BILITOT 1.1  --   PROT 6.7  --   ALBUMIN 3.5 3.9   Recent Labs    02/14/22 0415 02/14/22 0808 02/15/22 0444 06/25/22 0000  WBC 9.3 9.4 6.7 4.9  NEUTROABS  --  7.5 4.1 2,279.00  HGB 12.1 12.2 11.9* 14.6  HCT 37.3 36.8 36.4 44  MCV 98.9 97.9 97.6  --   PLT 97* 95* 110* 153   Lab Results  Component Value Date   TSH 4.35 06/25/2022   No results found for: "HGBA1C" Lab Results  Component Value Date   CHOL 193 05/13/2017   HDL 41 05/13/2017   LDLCALC 122 (H) 05/13/2017   TRIG 152 (H) 05/13/2017   CHOLHDL 4.7 (H) 05/13/2017    Significant Diagnostic Results in last 30 days:  No results found.  Assessment/Plan There  are no diagnoses linked to this encounter.   Family/ staff Communication: ***  Labs/tests ordered:  ***

## 2022-07-10 ENCOUNTER — Encounter: Payer: Self-pay | Admitting: Nurse Practitioner

## 2022-07-10 ENCOUNTER — Other Ambulatory Visit: Payer: Self-pay | Admitting: Family Medicine

## 2022-07-10 DIAGNOSIS — H18513 Endothelial corneal dystrophy, bilateral: Secondary | ICD-10-CM | POA: Diagnosis not present

## 2022-07-10 DIAGNOSIS — H35373 Puckering of macula, bilateral: Secondary | ICD-10-CM | POA: Diagnosis not present

## 2022-07-10 DIAGNOSIS — I480 Paroxysmal atrial fibrillation: Secondary | ICD-10-CM

## 2022-07-10 DIAGNOSIS — H16223 Keratoconjunctivitis sicca, not specified as Sjogren's, bilateral: Secondary | ICD-10-CM | POA: Diagnosis not present

## 2022-07-10 DIAGNOSIS — H10533 Contact blepharoconjunctivitis, bilateral: Secondary | ICD-10-CM | POA: Diagnosis not present

## 2022-07-10 DIAGNOSIS — H02421 Myogenic ptosis of right eyelid: Secondary | ICD-10-CM | POA: Diagnosis not present

## 2022-07-10 MED ORDER — METHOCARBAMOL 500 MG PO TABS: 500.0000 mg | ORAL_TABLET | Freq: Two times a day (BID) | ORAL | 5 refills | Status: DC

## 2022-07-10 MED ORDER — APIXABAN 5 MG PO TABS: 5.0000 mg | ORAL_TABLET | Freq: Two times a day (BID) | ORAL | 1 refills | Status: DC

## 2022-07-10 MED ORDER — BETAMETHASONE DIPROPIONATE AUG 0.05 % EX GEL: Freq: Every day | CUTANEOUS | 2 refills | Status: DC

## 2022-07-10 MED ORDER — FUROSEMIDE 20 MG PO TABS: 20.0000 mg | ORAL_TABLET | Freq: Every day | ORAL | 1 refills | Status: DC

## 2022-07-10 MED ORDER — CALCIUM 600+D PLUS MINERALS 600-400 MG-UNIT PO TABS: 1.0000 | ORAL_TABLET | Freq: Every day | ORAL | 5 refills | Status: DC

## 2022-07-10 MED ORDER — PREVAGEN 10 MG PO CAPS: 10.0000 mg | ORAL_CAPSULE | Freq: Every day | ORAL | 5 refills | Status: DC

## 2022-07-10 MED ORDER — LEVOTHYROXINE SODIUM 100 MCG PO TABS: 100.0000 ug | ORAL_TABLET | Freq: Every morning | ORAL | 3 refills | Status: DC

## 2022-07-10 MED ORDER — MEMANTINE HCL 10 MG PO TABS: 10.0000 mg | ORAL_TABLET | Freq: Two times a day (BID) | ORAL | 5 refills | Status: DC

## 2022-07-10 MED ORDER — FLECAINIDE ACETATE 50 MG PO TABS: 50.0000 mg | ORAL_TABLET | Freq: Two times a day (BID) | ORAL | 3 refills | Status: DC

## 2022-07-10 MED ORDER — MEMANTINE HCL 5 MG PO TABS: 5.0000 mg | ORAL_TABLET | Freq: Every day | ORAL | 5 refills | Status: DC

## 2022-07-10 MED ORDER — REFRESH RELIEVA PF 0.5-1 % OP SOLN: 1.0000 [drp] | Freq: Two times a day (BID) | OPHTHALMIC | 2 refills | Status: DC

## 2022-07-10 MED ORDER — SODIUM CHLORIDE (HYPERTONIC) 5 % OP SOLN: 1.0000 [drp] | Freq: Two times a day (BID) | OPHTHALMIC | 5 refills | Status: DC

## 2022-07-10 MED ORDER — ACETAMINOPHEN 325 MG PO TABS: 650.0000 mg | ORAL_TABLET | Freq: Four times a day (QID) | ORAL | 0 refills | Status: DC | PRN

## 2022-07-10 MED ORDER — ACETAMINOPHEN 500 MG PO TABS: 500.0000 mg | ORAL_TABLET | Freq: Three times a day (TID) | ORAL | 2 refills | Status: DC | PRN

## 2022-07-10 MED ORDER — SODIUM CHLORIDE (HYPERTONIC) 5 % OP OINT: 1.0000 | TOPICAL_OINTMENT | Freq: Every day | OPHTHALMIC | 2 refills | Status: DC

## 2022-07-10 MED ORDER — POTASSIUM CHLORIDE CRYS ER 10 MEQ PO TBCR: 10.0000 meq | EXTENDED_RELEASE_TABLET | Freq: Every day | ORAL | 3 refills | Status: DC

## 2022-07-10 NOTE — Assessment & Plan Note (Signed)
Heart rate is in control, on Metoprolol, Flecainide, Eliquis. Hgb 14.11/09/09/24

## 2022-07-10 NOTE — Assessment & Plan Note (Signed)
on Metoprolol, Furosemide, intermittently elevated Sbp, f/u Cardiology.

## 2022-07-10 NOTE — Assessment & Plan Note (Signed)
nectar liquid 

## 2022-07-10 NOTE — Assessment & Plan Note (Signed)
Has pacemaker, f/u Cardiology.

## 2022-07-10 NOTE — Assessment & Plan Note (Signed)
Bun/creat 20/0.85 06/28/22

## 2022-07-10 NOTE — Assessment & Plan Note (Signed)
taking Levothyroxine, TSH 2.159 09/24/21 

## 2022-07-10 NOTE — Assessment & Plan Note (Signed)
edema BLE, on Furosemide. EF 70-75% 09/19/21, 03/27/22 Venous US, negative DVT. 03/30/22 arterial US mild to moderate obstructive disease BLE 

## 2022-07-11 DIAGNOSIS — R41841 Cognitive communication deficit: Secondary | ICD-10-CM | POA: Diagnosis not present

## 2022-07-11 NOTE — Addendum Note (Signed)
Addended by: Casimer Leek C on: 07/11/2022 08:16 AM   Modules accepted: Orders

## 2022-07-12 ENCOUNTER — Encounter (HOSPITAL_COMMUNITY): Payer: Self-pay | Admitting: Nurse Practitioner

## 2022-07-12 ENCOUNTER — Ambulatory Visit (HOSPITAL_COMMUNITY)
Admission: RE | Admit: 2022-07-12 | Discharge: 2022-07-12 | Disposition: A | Discharge status: Home / Self Care | Payer: Medicare Other | Source: Ambulatory Visit | Attending: Nurse Practitioner | Admitting: Nurse Practitioner

## 2022-07-12 VITALS — BP 160/68 | HR 63 | Ht 63.0 in | Wt 202.2 lb

## 2022-07-12 DIAGNOSIS — I110 Hypertensive heart disease with heart failure: Secondary | ICD-10-CM | POA: Diagnosis not present

## 2022-07-12 DIAGNOSIS — Z87891 Personal history of nicotine dependence: Secondary | ICD-10-CM | POA: Diagnosis not present

## 2022-07-12 DIAGNOSIS — Z79899 Other long term (current) drug therapy: Secondary | ICD-10-CM | POA: Insufficient documentation

## 2022-07-12 DIAGNOSIS — Z950 Presence of cardiac pacemaker: Secondary | ICD-10-CM | POA: Insufficient documentation

## 2022-07-12 DIAGNOSIS — Z8249 Family history of ischemic heart disease and other diseases of the circulatory system: Secondary | ICD-10-CM | POA: Insufficient documentation

## 2022-07-12 DIAGNOSIS — I5032 Chronic diastolic (congestive) heart failure: Secondary | ICD-10-CM | POA: Insufficient documentation

## 2022-07-12 DIAGNOSIS — I480 Paroxysmal atrial fibrillation: Secondary | ICD-10-CM | POA: Diagnosis not present

## 2022-07-12 DIAGNOSIS — D6869 Other thrombophilia: Secondary | ICD-10-CM | POA: Diagnosis not present

## 2022-07-12 DIAGNOSIS — R001 Bradycardia, unspecified: Secondary | ICD-10-CM | POA: Diagnosis not present

## 2022-07-12 NOTE — Patient Instructions (Addendum)
Increase lasix to '40mg'$  daily for the next 3 days then reduce dose back to '20mg'$  daily   Increase potassium (KDUR) to 77mq daily for 3 days then reduce dose back to 160m daily

## 2022-07-12 NOTE — Progress Notes (Addendum)
Primary Care Physician: Wardell Honour, MD Primary Cardiologist: Dr Johney Frame Primary Electrophysiologist: Dr Quentin Ore  Referring Physician: [facility]   [name] is a 89 y.o. female with a history of paroxysmal atrial fibrillation, bradycardia tachy syndrome status post PPM, hypertension, hypothyroidism, chronic diastolic CHF. Patient is on pradaxa for a CHADS2VASC score of 4. She was seen at the ED 12/21/19 for sudden onset palpitations and SOB, similar to her afib episodes in the past. Patient had been taking flecainide only once per day. She denied any bleeding issues on anticoagulation.   F/u in afib clinic, 01/03/22 at the request of Dr. Rayann Heman. She had a 09/24/21 ED visit for atrial flutter and was cardioverted. She was minimally symptomatic. Dr. Rayann Heman  wants her followed in the afib clinic to avoid ED visits and for afib anxiety. She lives in a retirement facility and was sent from  there to the ED after last occurrence of afib. She has been changed at some point to eliquis 5 mg bid  for a CHA2DS2VASc  score of 5. EKG today shows a paced rhythm. She states that she will notice afib rarely, mostly at night and it will usually resolve in several hours without any additional meds.   F/u in afib clinic, 07/12/22. She had last EP f/u with Oda Kilts, 03/09/22. She remains in SR, a paced today. She is on flecainide 50 mg bid. She is now at [facility] and has not felt any afib since moving there last fall. It is noted that her weight is up 6 pounds and she appears to have more LEE than her baseline. She reports that she has been eating a lot of candy recently. No PND/orthopnea and no exertional dyspnea noted.   Today, she denies symptoms of palpitations, chest pain, shortness of breath, orthopnea, PND, lower extremity edema, dizziness, presyncope, syncope, snoring, daytime somnolence, bleeding, or neurologic sequela. The patient is tolerating medications without difficulties and  is otherwise without complaint today.    Atrial Fibrillation Risk Factors:  she does not have symptoms or diagnosis of sleep apnea. she does not have a history of rheumatic fever.  she has a BMI of Body mass index is 35.82 kg/m.Marland Kitchen Filed Weights   07/12/22 1111  Weight: 91.7 kg     Family History  Problem Relation Age of Onset   Heart disease Father    Coronary artery disease Other    Heart disease Son      Atrial Fibrillation Management history:  Previous antiarrhythmic drugs: flecainide Previous cardioversions: none Previous ablations: none CHADS2VASC score: 4 Anticoagulation history: Pradaxa   Past Medical History:  Diagnosis Date   Aortic stenosis 03/08/2016   Brady-tachy syndrome ([place])    a. Biotronik dual chamber PPM implanted 2009 b. gen change to STJ dual chamber PPM 2017   Ejection fraction    EF 70%, echo, October, 2009  //   EF 55-60%, septal dyssynergy consistent with a paced rhythm, mild to moderate mitral regurgitation, echo, November, 2015    GERD (gastroesophageal reflux disease) 04/28/2014   Episodes November, 2015 with fluid refluxing from her esophagus.    Hair loss    Patient questioned Coumadin, changed toPradaxa   Hypertension    Hypothyroidism    Lower extremity edema 01/22/2018   Mitral regurgitation 05/03/2014   Mild-to-moderate mitral regurgitation, echo, November, 2015    Osteoarthritis of left knee 10/25/2017   Paroxysmal atrial fibrillation ([facility])    Episodes rapid atrial fib noted  by pacemaker interrogation, August, 2011, diltiazem added, patient improved. Patient continues on Rythmol  //   Changed to flecainide 2013  //   flecainide level checked in 2013, good level    Persistent atrial fibrillation ([place])    Past Surgical History:  Procedure Laterality Date   ABDOMINAL HYSTERECTOMY     EP IMPLANTABLE DEVICE N/A 08/08/2015   Generator change with [employer] DR PPM by Dr Rayann Heman   JOINT REPLACEMENT     PACEMAKER PLACEMENT  2009         Current Outpatient Medications  Medication Sig Dispense Refill   acetaminophen (TYLENOL) 325 MG tablet Take 2 tablets (650 mg total) by mouth every 6 (six) hours as needed. 36 tablet 0   acetaminophen (TYLENOL) 500 MG tablet Take 1 tablet (500 mg total) by mouth every 8 (eight) hours as needed. **DAW** Give 1000 mg by mouth two times a day for Pain 60 tablet 2   apixaban (ELIQUIS) 5 MG TABS tablet Take 1 tablet (5 mg total) by mouth 2 (two) times daily. 180 tablet 1   Apoaequorin (PREVAGEN) 10 MG CAPS Take 10 mg by mouth daily. 90 capsule 5   betamethasone, augmented, (DIPROLENE) 0.05 % gel Apply topically daily.  Apply to scalp topically every 12 hours as needed for psoriasis related to PSORIASIS, UNSPECIFIED (L40.9) apply to affected area of scalp 60 g 2   Calcium Carbonate-Vit D-Min (CALCIUM 600+D PLUS MINERALS) 600-400 MG-UNIT TABS Take 1 tablet by mouth daily. 60 tablet 5   Carboxymethylcell-Glycerin PF (REFRESH RELIEVA PF) 0.5-1 % SOLN Apply 1 drop to eye in the morning and at bedtime. for relieve discomfort 2 each 2   flecainide (TAMBOCOR) 50 MG tablet Take 1 tablet (50 mg total) by mouth 2 (two) times daily. 180 tablet 3   furosemide (LASIX) 20 MG tablet Take 1 tablet (20 mg total) by mouth daily. 90 tablet 1   hydrocortisone 2.5 % ointment Apply to affected areas topically as needed for Rashy/itchy areas two times daily-MAY SELF ADMINISTER-per pt request     levothyroxine (SYNTHROID) 100 MCG tablet Take 1 tablet (100 mcg total) by mouth every morning. 90 tablet 3   memantine (NAMENDA) 10 MG tablet Take 1 tablet (10 mg total) by mouth 2 (two) times daily. for cognitive decline 60 tablet 5   methocarbamol (ROBAXIN) 500 MG tablet Take 1 tablet (500 mg total) by mouth 2 (two) times daily. 60 tablet 5   metoprolol succinate (TOPROL-XL) 25 MG 24 hr tablet Take 3 tablets (75 mg total) by mouth daily. 270 tablet 3   Multiple Vitamins-Minerals (OCUVITE PO) Take 1 tablet by mouth daily.      potassium chloride (KLOR-CON M) 10 MEQ tablet Take 1 tablet (10 mEq total) by mouth daily. 90 tablet 3   sodium chloride (MURO 128) 5 % ophthalmic ointment Place 1 Application into both eyes at bedtime.  for reduce swelling At least 1 hour after other drops 3.5 g 2   sodium chloride (MURO 128) 5 % ophthalmic solution Place 1 drop into both eyes in the morning and at bedtime. for decrease swelling Wait 1 hour between these and Refresh 15 mL 5   No current facility-administered medications for this encounter.    Allergies  Allergen Reactions   Morphine And Related Other (See Comments)    Patient states that she went "crazy" with this   Penicillins Swelling and Rash       Lasix [Furosemide] Itching    Patient stated it  made her itch all over and dry.   Aspirin     Pacemaker   Clindamycin/Lincomycin Other (See Comments)    Any meds with mycin in the name Unknown reaction   Diltiazem     Skin discoloration, lower extremity swelling   Erythromycin Other (See Comments)    Any meds with mycin in the name Unknown reaction   Lisinopril Other (See Comments)    REACTION: Cough   Penicillins Cross Reactors Other (See Comments)    Any meds with mycin in the name Unknown reaction    Social History   Socioeconomic History   Marital status: Widowed    Spouse name: Not on file   Number of children: Not on file   Years of education: Not on file   Highest education level: Not on file  Occupational History   Not on file  Tobacco Use   Smoking status: Former    Types: Cigarettes    Quit date: 09/06/1990    Years since quitting: 31.8   Smokeless tobacco: Never  Vaping Use   Vaping Use: Never used  Substance and Sexual Activity   Alcohol use: No   Drug use: No   Sexual activity: Not on file  Other Topics Concern   Not on file  Social History Narrative   Not on file   Social Determinants of Health   Financial Resource Strain: Not on file  Food Insecurity: No Food Insecurity  (02/15/2022)   Hunger Vital Sign    Worried About Running Out of Food in the Last Year: Never true    Ran Out of Food in the Last Year: Never true  Transportation Needs: No Transportation Needs (02/15/2022)   PRAPARE - Hydrologist (Medical): No    Lack of Transportation (Non-Medical): No  Physical Activity: Not on file  Stress: Not on file  Social Connections: Not on file  Intimate Partner Violence: Not At Risk (02/15/2022)   Humiliation, Afraid, Rape, and Kick questionnaire    Fear of Current or Ex-Partner: No    Emotionally Abused: No    Physically Abused: No    Sexually Abused: No     ROS- All systems are reviewed and negative except as per the HPI above.  Physical Exam: Vitals:   07/12/22 1111  BP: (!) 160/68  Pulse: 63  Weight: 91.7 kg  Height: '5\' 3"'$  (1.6 m)     GEN- The patient is well appearing elderly obese female, alert and oriented x 3 today.   HEENT-head normocephalic, atraumatic, sclera clear, conjunctiva pink, hearing intact, trachea midline. Lungs- Clear to ausculation bilaterally, normal work of breathing Heart- Regular rate and rhythm, no rubs or gallops. 2/6 systolic murmur  GI- soft, NT, ND, + BS Extremities- no clubbing, cyanosis, or edema MS- no significant deformity or atrophy Skin- no rash or lesion Psych- euthymic mood, full affect Neuro- strength and sensation are intact   Wt Readings from Last 3 Encounters:  07/12/22 91.7 kg  07/06/22 89.1 kg  06/29/22 89.1 kg   EKG- Vent. rate 63 BPM PR interval 266 ms QRS duration 100 ms QT/QTcB 458/468 ms P-R-T axes 88 50 255 Atrial-paced rhythm with prolonged AV conduction Minimal voltage criteria for LVH, may be normal variant ( Sokolow-Lyon ) Marked ST abnormality, possible inferolateral subendocardial injury Abnormal ECG When compared with ECG of 12-Feb-2022 02:01, PREVIOUS ECG IS PRESENT  Paceart reviewed from 04/14/23. No afib noted, normal functioning PPM.  Echo  07/16/19 demonstrated  1.  Left ventricular ejection fraction, by visual estimation, is 70 to  75%. The left ventricle has hyperdynamic function. There is moderately  increased left ventricular hypertrophy.   2. Elevated left atrial pressure.   3. Left ventricular diastolic parameters are consistent with Grade I  diastolic dysfunction (impaired relaxation).   4. The left ventricle has no regional wall motion abnormalities.   5. Global right ventricle has normal systolic function.The right  ventricular size is normal. No increase in right ventricular wall  thickness.   6. Left atrial size was moderately dilated.   7. Right atrial size was normal.   8. Moderate thickening of the mitral valve leaflet(s).   9. Severe mitral annular calcification.  10. The mitral valve is normal in structure. Mild mitral valve  regurgitation. No evidence of mitral stenosis.  11. The tricuspid valve is normal in structure.  12. The tricuspid valve is normal in structure. Tricuspid valve  regurgitation is mild.  13. Aortic valve mean gradient measures 26.0 mmHg.  14. The aortic valve is normal in structure. Aortic valve regurgitation is  not visualized. Moderate aortic valve stenosis.  15. There is severe calcifcation of the aortic valve.  16. There is severe thickening of the aortic valve.  17. The pulmonic valve was normal in structure. Pulmonic valve  regurgitation is not visualized.  18. Normal pulmonary artery systolic pressure.  19. The tricuspid regurgitant velocity is 2.06 m/s, and with an assumed  right atrial pressure of 3 mmHg, the estimated right ventricular systolic  pressure is normal at 20.0 mmHg.  20. The inferior vena cava is normal in size with greater than 50%  respiratory variability, suggesting right atrial pressure of 3 mmHg.   Epic records are reviewed at length today  CHA2DS2-VASc Score = 5  The patient's score is based upon: CHF History: 1 HTN History: 1 Diabetes History:  0 Stroke History: 0 Vascular Disease History: 0 Age Score: 2 Gender Score: 1   ASSESSMENT AND PLAN: 1. Paroxysmal Atrial Fibrillation (ICD10:  I48.0) The patient's CHA2DS2-VASc score is 5, indicating a 7.2% annual risk of stroke.   Overall afib burden low.   Continue flecainide 50 mg BID.Marland Kitchen Continue Toprol 75 mg daily Continue eliquis 5 mg BID  2. Secondary Hypercoagulable State (ICD10:  D68.69){ The patient is at significant risk for stroke/thromboembolism based upon her CHA2DS2-VASc Score of 5.  Continue  eliquis   3. Symptomatic bradycardia S/p PPM, followed by Dr. Quentin Ore  and the device clinic.  4. HTN Elevated  today with 6 lb weight increase and increased LEE Increase lasix to 20 mg bid x 3 days with K+ increased to 20 meq x 3 days Nursing staff instructed of this at [facility]  Reduce sugar and salt intake  Remote check 07/13/22 F/u with Dr. Johney Frame and device clinic as scheduled, someone needs to see her every 6 months on flecainide  Follow up in the AF clinic  as needed   [name], [facility] [address] [place], [place] [address] [phone]

## 2022-07-13 ENCOUNTER — Encounter: Payer: Self-pay | Admitting: Nurse Practitioner

## 2022-07-13 ENCOUNTER — Ambulatory Visit: Payer: Medicare Other

## 2022-07-13 ENCOUNTER — Non-Acute Institutional Stay: Payer: Medicare Other | Admitting: Nurse Practitioner

## 2022-07-13 DIAGNOSIS — Z0000 Encounter for general adult medical examination without abnormal findings: Secondary | ICD-10-CM | POA: Diagnosis not present

## 2022-07-13 DIAGNOSIS — F0390 Unspecified dementia without behavioral disturbance: Secondary | ICD-10-CM

## 2022-07-13 DIAGNOSIS — I495 Sick sinus syndrome: Secondary | ICD-10-CM

## 2022-07-13 LAB — CUP PACEART REMOTE DEVICE CHECK
Battery Remaining Longevity: 48 mo
Battery Remaining Percentage: 39 %
Battery Voltage: 2.96 V
Brady Statistic AP VP Percent: 1 %
Brady Statistic AP VS Percent: 98 %
Brady Statistic AS VP Percent: 1 %
Brady Statistic AS VS Percent: 2 %
Brady Statistic RA Percent Paced: 98 %
Brady Statistic RV Percent Paced: 1 %
Date Time Interrogation Session: 20240202040013
Implantable Lead Connection Status: 753985
Implantable Lead Connection Status: 753985
Implantable Lead Implant Date: 20091109
Implantable Lead Implant Date: 20091109
Implantable Lead Location: 753859
Implantable Lead Location: 753860
Implantable Lead Model: 350
Implantable Lead Serial Number: 24891460
Implantable Lead Serial Number: 28411861
Implantable Pulse Generator Implant Date: 20170227
Lead Channel Impedance Value: 1300 Ohm
Lead Channel Impedance Value: 450 Ohm
Lead Channel Pacing Threshold Amplitude: 0.75 V
Lead Channel Pacing Threshold Amplitude: 1 V
Lead Channel Pacing Threshold Pulse Width: 0.4 ms
Lead Channel Pacing Threshold Pulse Width: 0.4 ms
Lead Channel Sensing Intrinsic Amplitude: 12 mV
Lead Channel Sensing Intrinsic Amplitude: 2.1 mV
Lead Channel Setting Pacing Amplitude: 2 V
Lead Channel Setting Pacing Amplitude: 2.5 V
Lead Channel Setting Pacing Pulse Width: 0.4 ms
Lead Channel Setting Sensing Sensitivity: 2 mV
Pulse Gen Model: 2240
Pulse Gen Serial Number: 7885781

## 2022-07-13 NOTE — Progress Notes (Signed)
Subjective:   [name] is a 89 y.o. female who presents for Medicare Annual (Subsequent) preventive examination @ [facility].  Cardiac Risk Factors include: advanced age (>85mn, >>32women);dyslipidemia;hypertension;obesity (BMI >30kg/m2)     Objective:    Today's Vitals   07/13/22 1258 07/13/22 1305  BP: (!) 150/72   Pulse: 64   Resp: 18   Temp: 97.9 F (36.6 C)   SpO2: 98%   Weight: 198 lb 3.2 oz (89.9 kg)   Height: '5\' 6"'$  (1.676 m)   PainSc:  2    Body mass index is 31.99 kg/m.     07/06/2022   10:03 AM 06/29/2022   12:32 PM 03/23/2022    2:50 PM 02/27/2022   11:41 AM 02/20/2022    9:02 AM 02/12/2022    8:52 PM 02/12/2022    6:53 AM  Advanced Directives  Does Patient Have a Medical Advance Directive? Yes Yes Yes Yes Yes Yes   Type of AParamed[place]of [place]ola[place] of facility DNR (pink MOST or yellow form) [place] Heights[place] of facility DNR (pink MOST or yellow form) HW[place]kaLiving will;Out of facility DNR (pink MOST or yellow form) [place]Living will;Out of facility DNR (pink MOST or yellow form) H[place]ron[place]Living will;Out of facility DNR (pink MOST or yellow form) Living will Living will  Does patient want to make changes to medical advance directive? No - Patient declined No - Patient declined No - Patient declined No - Patient declined No - Patient declined No - Patient declined   Copy of HWillshirein Chart? Yes - validated most recent copy scanned in chart (See row information) Yes - validated most recent copy scanned in chart (See row information) Yes - validated most recent copy scanned in chart (See row information) Yes - validated most recent copy scanned in chart (See row information) Yes - validated most recent copy scanned in chart (See row information)    Pre-existing out of facility DNR order (yellow form or pink MOST form)   Yellow form  placed in chart (order not valid for inpatient use) Yellow form placed in chart (order not valid for inpatient use)  Pink MOST/Yellow Form most recent copy in chart - Physician notified to receive inpatient order     Current Medications (verified) Outpatient Encounter Medications as of 07/13/2022  Medication Sig   acetaminophen (TYLENOL) 325 MG tablet Take 2 tablets (650 mg total) by mouth every 6 (six) hours as needed.   acetaminophen (TYLENOL) 500 MG tablet Take 1 tablet (500 mg total) by mouth every 8 (eight) hours as needed. **DAW** Give 1000 mg by mouth two times a day for Pain   apixaban (ELIQUIS) 5 MG TABS tablet Take 1 tablet (5 mg total) by mouth 2 (two) times daily.   Apoaequorin (PREVAGEN) 10 MG CAPS Take 10 mg by mouth daily.   betamethasone, augmented, (DIPROLENE) 0.05 % gel Apply topically daily.  Apply to scalp topically every 12 hours as needed for psoriasis related to PSORIASIS, UNSPECIFIED (L40.9) apply to affected area of scalp   Calcium Carbonate-Vit D-Min (CALCIUM 600+D PLUS MINERALS) 600-400 MG-UNIT TABS Take 1 tablet by mouth daily.   Carboxymethylcell-Glycerin PF (REFRESH RELIEVA PF) 0.5-1 % SOLN Apply 1 drop to eye in the morning and at bedtime. for relieve discomfort   flecainide (TAMBOCOR) 50 MG tablet Take 1 tablet (50 mg total) by mouth 2 (two) times daily.  furosemide (LASIX) 20 MG tablet Take 1 tablet (20 mg total) by mouth daily.   hydrocortisone 2.5 % ointment Apply to affected areas topically as needed for Rashy/itchy areas two times daily-MAY SELF ADMINISTER-per pt request   levothyroxine (SYNTHROID) 100 MCG tablet Take 1 tablet (100 mcg total) by mouth every morning.   memantine (NAMENDA) 10 MG tablet Take 1 tablet (10 mg total) by mouth 2 (two) times daily. for cognitive decline   methocarbamol (ROBAXIN) 500 MG tablet Take 1 tablet (500 mg total) by mouth 2 (two) times daily.   metoprolol succinate (TOPROL-XL) 25 MG 24 hr tablet Take 3 tablets (75 mg total) by  mouth daily.   Multiple Vitamins-Minerals (OCUVITE PO) Take 1 tablet by mouth daily.   potassium chloride (KLOR-CON M) 10 MEQ tablet Take 1 tablet (10 mEq total) by mouth daily.   sodium chloride (MURO 128) 5 % ophthalmic ointment Place 1 Application into both eyes at bedtime.  for reduce swelling At least 1 hour after other drops   sodium chloride (MURO 128) 5 % ophthalmic solution Place 1 drop into both eyes in the morning and at bedtime. for decrease swelling Wait 1 hour between these and Refresh   No facility-administered encounter medications on file as of 07/13/2022.    Allergies (verified) Morphine and related, Penicillins, Lasix [furosemide], Aspirin, Clindamycin/lincomycin, Crestor [rosuvastatin], Diltiazem, Erythromycin, Lisinopril, Metronidazole, and Penicillins cross reactors   History: Past Medical History:  Diagnosis Date   Aortic stenosis 03/08/2016   Brady-tachy syndrome ([place])    a. Biotronik dual chamber PPM implanted 2009 b. gen change to STJ dual chamber PPM 2017   Ejection fraction    EF 70%, echo, October, 2009  //   EF 55-60%, septal dyssynergy consistent with a paced rhythm, mild to moderate mitral regurgitation, echo, November, 2015    GERD (gastroesophageal reflux disease) 04/28/2014   Episodes November, 2015 with fluid refluxing from her esophagus.    Hair loss    Patient questioned Coumadin, changed toPradaxa   Hypertension    Hypothyroidism    Lower extremity edema 01/22/2018   Mitral regurgitation 05/03/2014   Mild-to-moderate mitral regurgitation, echo, November, 2015    Osteoarthritis of left knee 10/25/2017   Paroxysmal atrial fibrillation (HCC)    Episodes rapid atrial fib noted by pacemaker interrogation, August, 2011, diltiazem added, patient improved. Patient continues on Rythmol  //   Changed to flecainide 2013  //   flecainide level checked in 2013, good level    Persistent atrial fibrillation ([place])    Past Surgical History:  Procedure Laterality  Date   ABDOMINAL HYSTERECTOMY     EP IMPLANTABLE DEVICE N/A 08/08/2015   Generator change with SJM Assurity DR PPM by Dr Rayann Heman   JOINT REPLACEMENT     PACEMAKER PLACEMENT  2009       Family History  Problem Relation Age of Onset   Heart disease Father    Coronary artery disease Other    Heart disease Son    Social History   Socioeconomic History   Marital status: Widowed    Spouse name: Not on file   Number of children: Not on file   Years of education: Not on file   Highest education level: Not on file  Occupational History   Not on file  Tobacco Use   Smoking status: Former    Types: Cigarettes    Quit date: 09/06/1990    Years since quitting: 31.8   Smokeless tobacco: Never  Vaping Use  Vaping Use: Never used  Substance and Sexual Activity   Alcohol use: No   Drug use: No   Sexual activity: Not on file  Other Topics Concern   Not on file  Social History Narrative   Not on file   Social Determinants of Health   Financial Resource Strain: Not on file  Food Insecurity: No Food Insecurity (02/15/2022)   Hunger Vital Sign    Worried About Running Out of Food in the Last Year: Never true    Ran Out of Food in the Last Year: Never true  Transportation Needs: No Transportation Needs (02/15/2022)   PRAPARE - Hydrologist (Medical): No    Lack of Transportation (Non-Medical): No  Physical Activity: Not on file  Stress: Not on file  Social Connections: Not on file    Tobacco Counseling Counseling given: Not Answered   Clinical Intake:  Pre-visit preparation completed: Yes  Pain : 0-10 Pain Score: 2  Pain Type: Chronic pain Pain Location: Knee Pain Orientation: Left, Right Pain Descriptors / Indicators: Aching Pain Onset: More than a month ago Pain Frequency: Intermittent Pain Relieving Factors: rest Effect of Pain on Daily Activities: less walking than usual  Pain Relieving Factors: rest  BMI - recorded: 31.99 Nutritional  Status: BMI > 30  Obese Nutritional Risks: None Diabetes: No  How often do you need to have someone help you when you read instructions, pamphlets, or other written materials from your doctor or pharmacy?: 3 - Sometimes What is the last grade level you completed in school?: high school  Diabetic?no  Interpreter Needed?: No  Information entered by :: Ankur Snowdon Bretta Bang NP   Activities of Daily Living    07/13/2022    1:07 PM 02/12/2022    6:53 AM  In your present state of health, do you have any difficulty performing the following activities:  Hearing? 1 0  Comment hearing aids.   Vision? 0 1  Difficulty concentrating or making decisions? 0 0  Walking or climbing stairs? 1 1  Dressing or bathing? 1 1  Doing errands, shopping? 1 1  Preparing Food and eating ? N   Using the Toilet? N   In the past six months, have you accidently leaked urine? Y   Do you have problems with loss of bowel control? N   Managing your Medications? Y   Managing your Finances? Y   Housekeeping or managing your Housekeeping? Y     Patient Care Team: Wardell Honour, MD as PCP - General (Family Medicine) Thompson Grayer, MD (Inactive) as PCP - Electrophysiology (Cardiology) Freada Bergeron, MD as PCP - Cardiology (Cardiology) Neldon Labella, RN as Case Manager Eugenia Mcalpine Deliah Goody, FNP (Hospice and Palliative Medicine)  Indicate any recent Medical Services you may have received from other than [facility] providers in the past year (date may be approximate).     Assessment:   This is a routine wellness examination for [place].  Hearing/Vision screen No results found.  Dietary issues and exercise activities discussed: Current Exercise Habits: The patient does not participate in regular exercise at present, Exercise limited by: neurologic condition(s);cardiac condition(s);orthopedic condition(s)   Goals Addressed             This Visit's Progress    Maintain Mobility and Function       Evidence-based  guidance:  Emphasize the importance of physical activity and aerobic exercise as included in treatment plan; assess barriers to adherence; consider patient's abilities and  preferences.  Encourage gradual increase in activity or exercise instead of stopping if pain occurs.  Reinforce individual therapy exercise prescription, such as strengthening, stabilization and stretching programs.  Promote optimal body mechanics to stabilize the spine with lifting and functional activity.  Encourage activity and mobility modifications to facilitate optimal function, such as using a log roll for bed mobility or dressing from a seated position.  Reinforce individual adaptive equipment recommendations to limit excessive spinal movements, such as a Systems analyst.  Assess adequacy of sleep; encourage use of sleep hygiene techniques, such as bedtime routine; use of white noise; dark, cool bedroom; avoiding daytime naps, heavy meals or exercise before bedtime.  Promote positions and modification to optimize sleep and sexual activity; consider pillows or positioning devices to assist in maintaining neutral spine.  Explore options for applying ergonomic principles at work and home, such as frequent position changes, using ergonomically designed equipment and working at optimal height.  Promote modifications to increase comfort with driving such as lumbar support, optimizing seat and steering wheel position, using cruise control and taking frequent rest stops to stretch and walk.   Notes:        Depression Screen     No data to display          Fall Risk    07/06/2022   10:04 AM 02/27/2022   11:41 AM 01/23/2022    3:53 PM 02/16/2016    3:22 PM  Fall Risk   Falls in the past year? 0  1 No  Comment    Emmi Telephone Survey: data to providers prior to load  Number falls in past yr: 0     Injury with Fall? 0     Risk for fall due to : No Fall Risks History of fall(s)    Follow up  Falls evaluation  completed      [place]:  Any stairs in or around the home? Yes  If so, are there any without handrails? No  Home free of loose throw rugs in walkways, pet beds, electrical cords, etc? Yes  Adequate lighting in your home to reduce risk of falls? Yes   ASSISTIVE DEVICES UTILIZED TO PREVENT FALLS:  Life alert? No  Use of a cane, walker or w/c? Yes  Grab bars in the bathroom? Yes  Shower chair or bench in shower? Yes  Elevated toilet seat or a handicapped toilet? Yes   TIMED UP AND GO:  Was the test performed? Yes .  Length of time to ambulate 10 feet: 15 sec.   Gait slow and steady with assistive device  Cognitive Function:        Immunizations Immunization History  Administered Date(s) Administered   Fluad Quad(high Dose 65+) 04/02/2022   Influenza Split 03/11/2009, 03/11/2010, 02/25/2011, 02/24/2019, 03/08/2020, 03/25/2021   Influenza, High Dose Seasonal PF 03/08/2015, 04/03/2017, 03/17/2018, 03/08/2020   Influenza,inj,Quad PF,6+ Mos 03/08/2016   Influenza,inj,quad, With Preservative 03/11/2017   Moderna Covid-19 Vaccine Bivalent Booster 67yr & up 10/27/2021   Moderna SARS-COV2 Booster Vaccination 11/08/2020   Moderna Sars-Covid-2 Vaccination 06/15/2019, 07/13/2019, 04/19/2020, 02/27/2022   Pfizer Covid-19 Vaccine Bivalent Booster 158yr& up 02/28/2021, 04/12/2022   Pneumococcal Conjugate-13 12/06/2014   Pneumococcal Polysaccharide-23 05/13/2001, 03/11/2009   Polio, Unspecified 02/02/1958   Tdap 11/21/2011    TDAP status: Due, Education has been provided regarding the importance of this vaccine. Advised may receive this vaccine at local pharmacy or Health Dept. Aware to provide a copy of the  vaccination record if obtained from local pharmacy or Health Dept. Verbalized acceptance and understanding.  Flu Vaccine status: Up to date  Pneumococcal vaccine status: Up to date  Covid-19 vaccine status: Information provided on how to  obtain vaccines.   Qualifies for Shingles Vaccine? Yes   Zostavax completed Yes   Shingrix Completed?: No.    Education has been provided regarding the importance of this vaccine. Patient has been advised to call insurance company to determine out of pocket expense if they have not yet received this vaccine. Advised may also receive vaccine at local pharmacy or Health Dept. Verbalized acceptance and understanding.  Screening Tests Health Maintenance  Topic Date Due   Zoster Vaccines- Shingrix (1 of 2) Never done   DTaP/Tdap/Td (2 - Td or Tdap) 11/20/2021   COVID-19 Vaccine (7 - 2023-24 season) 09/11/2022 (Originally 06/07/2022)   Medicare Annual Wellness (AWV)  07/14/2023   Pneumonia Vaccine 61+ Years old  Completed   INFLUENZA VACCINE  Completed   DEXA SCAN  Completed   HPV VACCINES  Aged Out    Health Maintenance  Health Maintenance Due  Topic Date Due   Zoster Vaccines- Shingrix (1 of 2) Never done   DTaP/Tdap/Td (2 - Td or Tdap) 11/20/2021    Colorectal cancer screening: No longer required.   Mammogram status: No longer required due to aged out.  Bone Density status: Ordered 07/13/22. Pt provided with contact info and advised to call to schedule appt.  Lung Cancer Screening: (Low Dose CT Chest recommended if Age 42-80 years, 30 pack-year currently smoking OR have quit w/in 15years.) does not qualify.   Additional Screening:  Hepatitis C Screening: does not qualify  Vision Screening: Recommended annual ophthalmology exams for early detection of glaucoma and other disorders of the eye. Is the patient up to date with their annual eye exam?  No  Who is the provider or what is the name of the office in which the patient attends annual eye exams? [facility] will provide If pt is not established with a provider, would they like to be referred to a provider to establish care? No .   Dental Screening: Recommended annual dental exams for proper oral hygiene  Community Resource  Referral / Chronic Care Management: CRR required this visit?  No   CCM required this visit?  No      Plan:     I have personally reviewed and noted the following in the patient's chart:   Medical and social history Use of alcohol, tobacco or illicit drugs  Current medications and supplements including opioid prescriptions. Patient is not currently taking opioid prescriptions. Functional ability and status Nutritional status Physical activity Advanced directives List of other physicians Hospitalizations, surgeries, and ER visits in previous 12 months Vitals Screenings to include cognitive, depression, and falls Referrals and appointments  In addition, I have reviewed and discussed with patient certain preventive protocols, quality metrics, and best practice recommendations. A written personalized care plan for preventive services as well as general preventive health recommendations were provided to patient.     Marlane Hirschmann X Arnika Larzelere, NP   07/13/2022

## 2022-07-16 DIAGNOSIS — R41841 Cognitive communication deficit: Secondary | ICD-10-CM | POA: Diagnosis not present

## 2022-07-16 NOTE — Progress Notes (Signed)
This encounter was created in error - please disregard.

## 2022-07-19 ENCOUNTER — Encounter: Payer: Self-pay | Admitting: Adult Health

## 2022-07-19 ENCOUNTER — Encounter (HOSPITAL_COMMUNITY): Payer: Self-pay | Admitting: *Deleted

## 2022-07-19 ENCOUNTER — Non-Acute Institutional Stay: Payer: Medicare Other | Admitting: Adult Health

## 2022-07-19 DIAGNOSIS — R339 Retention of urine, unspecified: Secondary | ICD-10-CM

## 2022-07-19 DIAGNOSIS — I4819 Other persistent atrial fibrillation: Secondary | ICD-10-CM

## 2022-07-19 DIAGNOSIS — I5032 Chronic diastolic (congestive) heart failure: Secondary | ICD-10-CM | POA: Diagnosis not present

## 2022-07-19 DIAGNOSIS — R300 Dysuria: Secondary | ICD-10-CM | POA: Diagnosis not present

## 2022-07-19 NOTE — Progress Notes (Signed)
Location:  [facility] Room Number: 979-G Place of Service:  SNF (31) Provider:  Durenda Age, DNP, FNP-BC  Patient Care Team: Wardell Honour, MD as PCP - General (Family Medicine) Thompson Grayer, MD (Inactive) as PCP - Electrophysiology (Cardiology) Freada Bergeron, MD as PCP - Cardiology (Cardiology) Neldon Labella, RN as Case Manager Eugenia Mcalpine Deliah Goody, FNP (Hospice and Palliative Medicine)  Extended Emergency Contact Information Primary Emergency Contact: Pat Kocher Address: [address], [place] Phone: [phone] Mobile Phone: [phone] Relation: Daughter Secondary Emergency Contact: Sonny Masters Home Phone: [phone] Mobile Phone: [phone] Relation: Daughter Interpreter needed? No  Code Status:  DNR  Goals of care: Advanced Directive information    07/19/2022    9:38 AM  Advanced Directives  Does Patient Have a Medical Advance Directive? Yes  Type of Par[employer] of [place];Out of facility DNR (pink MOST or yellow form)  Does patient want to make changes to medical advance directive? No - Patient declined  Copy of Hope in Chart? Yes - validated most recent copy scanned in chart (See row information)  Pre-existing out of facility DNR order (yellow form or pink MOST form) Yellow form placed in chart (order not valid for inpatient use)     Chief Complaint  Patient presents with   Acute Visit    Urinary retention    HPI:  Pt is a 89 y.o. female seen today for an acute visit regarding urinary retention. She has a PMH of PAF, bradycardia tachy syndrome S/P PPM, hypertension, hypothyroidism and chronic diastolic CHF. She was seen in her room today. She stated that last night, she had decreased urine output and was repeatedly going to the bathroom. She, also, noted both her lower extremities were more swollen. However, this  morning she reported urinating "normally" and  her legs are not that swollen. No noted shortness of breath.   Past Medical History:  Diagnosis Date   Aortic stenosis 03/08/2016   Brady-tachy syndrome ([place])    a. Biotronik dual chamber PPM implanted 2009 b. gen change to STJ dual chamber PPM 2017   Ejection fraction    EF 70%, echo, October, 2009  //   EF 55-60%, septal dyssynergy consistent with a paced rhythm, mild to moderate mitral regurgitation, echo, November, 2015    GERD (gastroesophageal reflux disease) 04/28/2014   Episodes November, 2015 with fluid refluxing from her esophagus.    Hair loss    Patient questioned Coumadin, changed toPradaxa   Hypertension    Hypothyroidism    Lower extremity edema 01/22/2018   Mitral regurgitation 05/03/2014   Mild-to-moderate mitral regurgitation, echo, November, 2015    Osteoarthritis of left knee 10/25/2017   Paroxysmal atrial fibrillation (HCC)    Episodes rapid atrial fib noted by pacemaker interrogation, August, 2011, diltiazem added, patient improved. Patient continues on Rythmol  //   Changed to flecainide 2013  //   flecainide level checked in 2013, good level    Persistent atrial fibrillation ([place])    Past Surgical History:  Procedure Laterality Date   ABDOMINAL HYSTERECTOMY     EP IMPLANTABLE DEVICE N/A 08/08/2015   Generator change with SJM [employer]urity DR PPM by Dr Rayann Heman   JOINT REPLACEMENT     PACEMAKER PLACEMENT  2009        Allergies  Allergen Reactions   Morphine And Related Other (See Comments)  Patient states that she went "crazy" with this   Penicillins Swelling and Rash    Has patient had a PCN reaction causing immediate rash, facial/tongue/throat swelling, SOB or lightheadedness with hypotension:  Has patient had a PCN reaction causing severe rash involving mucus membranes or skin necrosis:  Has patient had a PCN reaction that required hospitalization  Has patient had a PCN reaction occurring within the last 10  years:  If all of the above answers are "NO", then may proceed with Cephalosporin use.   Lasix [Furosemide] Itching    Patient stated it made her itch all over and dry.   Aspirin     Pacemaker   Clindamycin/Lincomycin Other (See Comments)    Any meds with mycin in the name Unknown reaction   Crestor [Rosuvastatin]     Other reaction(s): muscle pain   Diltiazem     Skin discoloration, lower extremity swelling   Erythromycin Other (See Comments)    Any meds with mycin in the name Unknown reaction   Lisinopril Other (See Comments)    REACTION: Cough   Metronidazole     Other reaction(s): mouth sores, tongue swelling   Penicillins Cross Reactors Other (See Comments)    Any meds with mycin in the name Unknown reaction    Outpatient Encounter Medications as of 07/19/2022  Medication Sig   acetaminophen (TYLENOL) 325 MG tablet Take 2 tablets (650 mg total) by mouth every 6 (six) hours as needed.   acetaminophen (TYLENOL) 500 MG tablet Take 1 tablet (500 mg total) by mouth every 8 (eight) hours as needed. **DAW** Give 1000 mg by mouth two times a day for Pain   apixaban (ELIQUIS) 5 MG TABS tablet Take 1 tablet (5 mg total) by mouth 2 (two) times daily.   Apoaequorin (PREVAGEN) 10 MG CAPS Take 10 mg by mouth daily.   betamethasone, augmented, (DIPROLENE) 0.05 % gel Apply topically daily.  Apply to scalp topically every 12 hours as needed for psoriasis related to PSORIASIS, UNSPECIFIED (L40.9) apply to affected area of scalp   Calcium Carbonate-Vit D-Min (CALCIUM 600+D PLUS MINERALS) 600-400 MG-UNIT TABS Take 1 tablet by mouth daily.   Carboxymethylcell-Glycerin PF (REFRESH RELIEVA PF) 0.5-1 % SOLN Apply 1 drop to eye in the morning and at bedtime. for relieve discomfort   diclofenac Sodium (VOLTAREN) 1 % GEL Apply to left foot bunion deformit topically two times a day for Pain 1 gm   flecainide (TAMBOCOR) 50 MG tablet Take 1 tablet (50 mg total) by mouth 2 (two) times daily.   furosemide  (LASIX) 20 MG tablet Take 1 tablet (20 mg total) by mouth daily.   hydrocortisone 2.5 % ointment Apply to affected areas topically as needed for Rashy/itchy areas two times daily-MAY SELF ADMINISTER-per pt request   levothyroxine (SYNTHROID) 100 MCG tablet Take 1 tablet (100 mcg total) by mouth every morning.   memantine (NAMENDA) 10 MG tablet Take 1 tablet (10 mg total) by mouth 2 (two) times daily. for cognitive decline   metoprolol succinate (TOPROL-XL) 25 MG 24 hr tablet Take 3 tablets (75 mg total) by mouth daily.   Multiple Vitamins-Minerals (OCUVITE PO) Take 1 tablet by mouth daily.   potassium chloride (KLOR-CON M) 10 MEQ tablet Take 1 tablet (10 mEq total) by mouth daily.   sodium chloride (MURO 128) 5 % ophthalmic ointment Place 1 Application into both eyes at bedtime.  for reduce swelling At least 1 hour after other drops   sodium chloride (MURO 128) 5 %  ophthalmic solution Place 1 drop into both eyes in the morning and at bedtime. for decrease swelling Wait 1 hour between these and Refresh   methocarbamol (ROBAXIN) 500 MG tablet Take 1 tablet (500 mg total) by mouth 2 (two) times daily. (Patient not taking: Reported on 07/19/2022)   No facility-administered encounter medications on file as of 07/19/2022.    Review of Systems  Constitutional:  Negative for appetite change, chills, fatigue and fever.  HENT:  Negative for congestion, hearing loss, rhinorrhea and sore throat.   Eyes: Negative.   Respiratory:  Negative for cough, shortness of breath and wheezing.   Cardiovascular:  Negative for chest pain, palpitations and leg swelling.  Gastrointestinal:  Negative for abdominal pain, constipation, diarrhea, nausea and vomiting.  Genitourinary:  Positive for decreased urine volume. Negative for dysuria.  Musculoskeletal:  Negative for arthralgias, back pain and myalgias.  Skin:  Negative for color change, rash and wound.  Neurological:  Negative for dizziness, weakness and headaches.   Psychiatric/Behavioral:  Negative for behavioral problems. The patient is not nervous/anxious.        Immunization History  Administered Date(s) Administered   Fluad Quad(high Dose 65+) 04/02/2022   Influenza Split 03/11/2009, 03/11/2010, 02/25/2011, 02/24/2019, 03/08/2020, 03/25/2021   Influenza, High Dose Seasonal PF 03/08/2015, 04/03/2017, 03/17/2018, 03/08/2020   Influenza,inj,Quad PF,6+ Mos 03/08/2016   Influenza,inj,quad, With Preservative 03/11/2017   Moderna Covid-19 Vaccine Bivalent Booster 53yr & up 10/27/2021   Moderna SARS-COV2 Booster Vaccination 11/08/2020   Moderna Sars-Covid-2 Vaccination 06/15/2019, 07/13/2019, 04/19/2020, 02/27/2022   Pfizer Covid-19 Vaccine Bivalent Booster 138yr& up 02/28/2021, 04/12/2022   Pneumococcal Conjugate-13 12/06/2014   Pneumococcal Polysaccharide-23 05/13/2001, 03/11/2009   Polio, Unspecified 02/02/1958   Tdap 11/21/2011   Pertinent  Health Maintenance Due  Topic Date Due   INFLUENZA VACCINE  Completed   DEXA SCAN  Completed      02/16/2022   12:00 AM 02/16/2022    9:45 AM 02/27/2022   11:41 AM 07/06/2022   10:04 AM 07/13/2022    3:55 PM  Fall Risk  Falls in the past year?    0 0  Was there an injury with Fall?    0 0  Fall Risk Category Calculator    0 0  (RETIRED) Patient Fall Risk Level High fall risk High fall risk High fall risk    Patient at Risk for Falls Due to   History of fall(s) No Fall Risks No Fall Risks  Fall risk Follow up   Falls evaluation completed  Falls evaluation completed     Vitals:   07/19/22 0934  BP: 132/76  Pulse: 63  Resp: 14  Temp: 98 F (36.7 C)  SpO2: 100%  Weight: 198 lb 3.2 oz (89.9 kg)  Height: '5\' 6"'$  (1.676 m)   Body mass index is 31.99 kg/m.  Physical Exam Constitutional:      General: She is not in acute distress.    Appearance: She is obese.  HENT:     Head: Normocephalic and atraumatic.     Nose: Nose normal.     Mouth/Throat:     Mouth: Mucous membranes are moist.  Eyes:      Conjunctiva/sclera: Conjunctivae normal.  Cardiovascular:     Rate and Rhythm: Normal rate. Rhythm irregular.     Heart sounds: Murmur heard.  Pulmonary:     Effort: Pulmonary effort is normal.     Breath sounds: Normal breath sounds.  Abdominal:     General: Bowel sounds are normal.  Palpations: Abdomen is soft.  Musculoskeletal:        General: Swelling present. Normal range of motion.     Cervical back: Normal range of motion.     Right lower leg: Edema present.     Left lower leg: Edema present.     Comments: BLE trace edema Walks with walker  Skin:    General: Skin is warm and dry.  Neurological:     General: No focal deficit present.     Mental Status: She is alert and oriented to person, place, and time.  Psychiatric:        Mood and Affect: Mood normal.        Behavior: Behavior normal.        Thought Content: Thought content normal.        Judgment: Judgment normal.       Labs reviewed: Recent Labs    09/24/21 1121 02/11/22 2351 02/14/22 0808 02/15/22 0444 03/09/22 1218 06/28/22 0000 07/05/22 0000  NA 142   < > 139 141 140 141 140  K 4.1   < > 3.9 3.4* 4.3 4.4 4.0  CL 107   < > 109 109 105 104 104  CO2 27   < > '26 25 25 '$ 30* 28*  GLUCOSE 110*   < > 102* 100* 81  --   --   BUN 15   < > '19 15 20 20 21  '$ CREATININE 0.93   < > 0.81 0.71 1.03* 0.9 0.9  CALCIUM 9.7   < > 8.4* 8.3* 9.8 9.5 9.2  MG 2.0  --  2.0 2.2  --   --   --    < > = values in this interval not displayed.   Recent Labs    02/11/22 2351 06/28/22 0000 07/05/22 0000  AST 35 29 23  ALT '17 23 18  '$ ALKPHOS 44 46 44  BILITOT 1.1  --   --   PROT 6.7  --   --   ALBUMIN 3.5 3.9 3.8   Recent Labs    02/14/22 0415 02/14/22 0808 02/15/22 0444 06/25/22 0000  WBC 9.3 9.4 6.7 4.9  NEUTROABS  --  7.5 4.1 2,279.00  HGB 12.1 12.2 11.9* 14.6  HCT 37.3 36.8 36.4 44  MCV 98.9 97.9 97.6  --   PLT 97* 95* 110* 153   Lab Results  Component Value Date   TSH 4.35 06/25/2022   No results  found for: "HGBA1C" Lab Results  Component Value Date   CHOL 193 05/13/2017   HDL 41 05/13/2017   LDLCALC 122 (H) 05/13/2017   TRIG 152 (H) 05/13/2017   CHOLHDL 4.7 (H) 05/13/2017    Significant Diagnostic Results in last 30 days:  CUP PACEART REMOTE DEVICE CHECK  Result Date: 07/13/2022 Scheduled remote reviewed. Normal device function.  1 atrial arrhythmia detected, 22sec in duration Next remote 91 days. LA   Assessment/Plan  1. Urinary retention -  will get UA with CS -   denies hematuria, chills nor fever  2. Chronic diastolic heart failure (HCC) -  no SOB, continue Lasix  3. Persistent atrial fibrillation (HCC) -  rate-controlled, continue Metoprolol succinate and Flecainide    Family/ staff Communication: Discussed plan of care with resident and charge nurse.  Labs/tests ordered:  UA with CS    Durenda Age, DNP, MSN, FNP-BC [place] [phone] (Monday-Friday 8:00 a.m. - 5:00 p.m.) [phone] (after hours)

## 2022-07-23 ENCOUNTER — Encounter: Payer: Self-pay | Admitting: Nurse Practitioner

## 2022-07-23 DIAGNOSIS — N390 Urinary tract infection, site not specified: Secondary | ICD-10-CM | POA: Insufficient documentation

## 2022-07-24 ENCOUNTER — Non-Acute Institutional Stay: Payer: Medicare Other | Admitting: Family Medicine

## 2022-07-24 VITALS — BP 132/70 | HR 59 | Temp 97.8°F | Resp 14

## 2022-07-24 DIAGNOSIS — F0390 Unspecified dementia without behavioral disturbance: Secondary | ICD-10-CM

## 2022-07-24 DIAGNOSIS — R600 Localized edema: Secondary | ICD-10-CM | POA: Diagnosis not present

## 2022-07-24 NOTE — Progress Notes (Unsigned)
[facility] Pall[facility] Consult Note Telephone: [phone]  Fax: [phone]    Date of encounter: 07/24/22 3:49 PM PATIENT NAME: [name] [address] [place] [address]   [phone] (home)  DOB: 01-13-1928 MRN: [id number] PRIMARY CARE PROVIDER:    Wardell Honour, MD,  [place] [address] [phone]  REFERRING PROVIDER:   Wardell [name], [address] [place],  [place] [address] [phone]  RESPONSIBLE PARTY:    Contact Information     Name Relation Home Work [name] Daughter [phone]  [phone]   [name] Daughter [phone]  [phone]        I met face to face with patient in [place]. Palliative Care was asked to follow this patient by consultation request of  [name] Honour, MD to address advance care planning and complex medical decision making. This is a follow up visit   ASSESSMENT , SYMPTOM MANAGEMENT AND PLAN / RECOMMENDATIONS:  Senile dementia FAST 7 Score 4 Encourage continued socialization and playing piano despite sight loss. Encourage continued mobilization multiple times daily.  Continue Namenda 10 mg BID  2.  LE Edema Continue increased water intake, elevate legs when sitting and use of support hose for compression Continue Lasix 20 mg daily with potassium 10 mEq daily supplementation   Advance Care Planning/Goals of Care: Goals include to maximize quality of life and symptom management.   Identification of a healthcare agent-[name] and [name] (daughters) Review of an existing advance directive document. Decision not to resuscitate or to de-escalate disease focused treatments due to poor prognosis. CODE STATUS: DNR     Follow up Palliative Care Visit: Palliative care will continue to follow for complex medical decision making, advance care planning, and clarification of goals. Return 4-6 weeks  or prn.    This visit was coded based on medical decision making (MDM).  PPS: 60%  HOSPICE ELIGIBILITY/DIAGNOSIS: TBD  Chief Complaint:  Palliative Care is continuing to follow patient for chronic medical management in setting of dementia and to assist with refining and defining goals of care as desired.  HISTORY OF PRESENT ILLNESS:  [name] is a 89 y.o. year old female with Dementia. Continues to sleep in her recliner and can't lay flat which is baseline.  Has some short term memory loss.  Denies CP, SOB, falls, nausea/vomiting, dysuria or constipation.  States she always has good appetite. Likes coffee and drinks decaf and lots of water.  Sleeps well, usually gets up once to go to the bathroom. Has help with bathing and has some dressing help of BLE.  Wearing support hose. She walks with her walker to meals 3 times a day.  History obtained from review of EMR, discussion with facility staff and/or [name].      Latest Ref Rng & Units 06/25/2022   12:00 AM 02/15/2022    4:44 AM 02/14/2022    8:08 AM  CBC  WBC  4.9     6.7  9.4   Hemoglobin 12.0 - 16.0 14.6     11.9  12.2   Hematocrit 36 - 46 44     36.4  36.8   Platelets 150 - 400 K/uL 153     110  95      This result is from an external source.       Latest Ref Rng & Units 07/05/2022   12:00 AM  06/28/2022   12:00 AM 03/09/2022   12:18 PM  CMP  Glucose 70 - 99 mg/dL   81   BUN 4 - 21 21     20     20   $ Creatinine 0.5 - 1.1 0.9     0.9     1.03   Sodium 137 - 147 140     141     140   Potassium 3.5 - 5.1 mEq/L 4.0     4.4     4.3   Chloride 99 - 108 104     104     105   CO2 13 - 22 28     30     25   $ Calcium 8.7 - 10.7 9.2     9.5     9.8   Alkaline Phos 25 - 125 44     46       AST 13 - 35 23     29       ALT 7 - 35 U/L 18     23          This result is from an external source.      Latest Ref Rng & Units 07/05/2022   12:00 AM 06/28/2022   12:00 AM 02/11/2022   11:51 PM  Hepatic Function  Total Protein 6.5 - 8.1 g/dL    6.7   Albumin 3.5 - 5.0 3.8     3.9     3.5   AST 13 - 35 23     29     35   ALT 7 - 35 U/L 18     23     17   $ Alk Phosphatase 25 - 125 44     46     44   Total Bilirubin 0.3 - 1.2 mg/dL   1.1      This result is from an external source.  06/25/22 TSH normal at 4.35  I reviewed EMR for available labs, medications, imaging, studies and related documents.  New records reviewed and summarized above since last visit.  ROS General: NAD EYE: almost blind in right eye, rapidly losing eyesight in left. ENMT: denies dysphagia Cardiovascular: denies chest pain, denies DOE Pulmonary: denies cough, denies SOB, has baseline orthopnea Abdomen: endorses good appetite, denies constipation, endorses continence of bowel GU: denies dysuria, endorses continence of urine MSK:  denies increased weakness,  Back pain when laying down is baseline, no falls reported, Ambulates with platform walker. Skin: denies rashes or wounds Neurological: endorses chronic back pain improved with platform walker, denies insomnia.  Reports short term memory loss Psych: Endorses positive mood Heme:  No significant bleeding  Physical Exam: Current and past weights: 198 lbs 3.2 oz on 07/19/22, weight 190 lbs 9.6 oz on 03/09/22 Constitutional: NAD General: WN/obese  ENMT: intact hearing, oral mucous membranes moist, dentition intact CV: S1S2, RRR, no LLE edema, trace right ankle edema Pulmonary: No audible increased work of breathing, no cough, room air Abdomen: abd soft and non tender MSK: no sarcopenia, moves all extremities, ambulatory Neuro:  no generalized weakness,  noted short term memory loss Psych: non-anxious affect, A and O x 3 Hem/lymph/immuno: no widespread bruising   Thank you for the opportunity to participate in the care of [name].  The palliative care team will continue to follow. Please call our office at [phone] if we can be of additional assistance.   [name] Conception, FNP -C  COVID-19 PATIENT  SCREENING TOOL Asked and negative response unless otherwise noted:   Have you had symptoms of covid, tested positive or been in contact with someone with symptoms/positive test in the past 5-10 days?  unknown

## 2022-07-25 ENCOUNTER — Encounter: Payer: Self-pay | Admitting: Family Medicine

## 2022-07-27 ENCOUNTER — Encounter: Payer: Self-pay | Admitting: Nurse Practitioner

## 2022-07-27 ENCOUNTER — Non-Acute Institutional Stay: Payer: Medicare Other | Admitting: Nurse Practitioner

## 2022-07-27 DIAGNOSIS — I10 Essential (primary) hypertension: Secondary | ICD-10-CM

## 2022-07-27 DIAGNOSIS — E039 Hypothyroidism, unspecified: Secondary | ICD-10-CM | POA: Diagnosis not present

## 2022-07-27 DIAGNOSIS — I4819 Other persistent atrial fibrillation: Secondary | ICD-10-CM

## 2022-07-27 DIAGNOSIS — I509 Heart failure, unspecified: Secondary | ICD-10-CM

## 2022-07-27 DIAGNOSIS — N390 Urinary tract infection, site not specified: Secondary | ICD-10-CM

## 2022-07-27 DIAGNOSIS — F0390 Unspecified dementia without behavioral disturbance: Secondary | ICD-10-CM | POA: Diagnosis not present

## 2022-07-27 NOTE — Assessment & Plan Note (Signed)
reported the patient's c/o she could not feel her heart beat by placing her right hand over her chest, resolved before nurse visiting. Denied headache, dizziness, chest apin, SOB, palpitation, labored breathing, diaphoresis, or feeling of impending doom. The patient stated she sleep well afterwards, feels usual state of health today, declined EKG or cardiology follow up.  Heart rate is in control,  on Metoprolol, Flecainide, Eliquis. Hgb 14.11/09/09/24

## 2022-07-27 NOTE — Assessment & Plan Note (Signed)
07/19/22 urine culture E Coli, Nitrofurantoin 179m bid x 7 days. Asymptomatic today.

## 2022-07-27 NOTE — Assessment & Plan Note (Signed)
edema BLE, on Furosemide. EF 70-75% 09/19/21, 03/27/22 Venous US, negative DVT. 03/30/22 arterial US mild to moderate obstructive disease BLE

## 2022-07-27 NOTE — Assessment & Plan Note (Signed)
tolerated Namenda well, helped with her mood as well. MMSE 26/30 03/23/22,  TSH 4.35 06/21/22

## 2022-07-27 NOTE — Assessment & Plan Note (Signed)
taking Levothyroxine, TSH 2.159 09/24/21

## 2022-07-27 NOTE — Assessment & Plan Note (Signed)
Blood pressure is controlled, on Metoprolol, Furosemide, intermittently elevated Sbp, f/u Cardiolog

## 2022-07-27 NOTE — Progress Notes (Signed)
Location:  [facility] Room Number: [id number] Place of Service:  ALF [phone]) Provider:  Francene Castle, MD  Patient Care Team: Wardell Honour, MD as PCP - General (Family Medicine) Thompson Grayer, MD (Inactive) as PCP - Electrophysiology (Cardiology) Freada Bergeron, MD as PCP - Cardiology (Cardiology) Neldon Labella, RN as Case Manager Eugenia Mcalpine Deliah Goody, FNP (Hospice and Palliative Medicine)  Extended Emergency Contact Information Primary Emergency Contact: Pat Kocher Address: [address], [place] Phone: [phone] Mobile Phone: [phone] Relation: Daughter Secondary Emergency Contact: Sonny Masters Home Phone: [phone] Mobile Phone: [phone] Relation: Daughter Interpreter needed? No  Code Status:  DNR Goals of care: Advanced Directive information    07/27/2022   11:18 AM  Advanced Directives  Does Patient Have a Medical Advance Directive? Yes  Type of Paramedic of [place];Out of facility DNR (pink MOST or yellow form)  Does patient want to make changes to medical advance directive? No - Patient declined  Copy of [place] in Chart? Yes - validated most recent copy scanned in chart (See row information)  Pre-existing out of facility DNR order (yellow form or pink MOST form) Yellow form placed in chart (order not valid for inpatient use)     Chief Complaint  Patient presents with   Acute Visit    Not felt heart beat last night    HPI:  Pt is a 89 y.o. female seen today for an acute visit for reported the patient's c/o she could not feel her heart beat by placing her right hand over her chest, resolved before nurse visiting. Denied headache, dizziness, chest apin, SOB, palpitation, labored breathing, diaphoresis, or feeling of impending doom. The patient stated she sleep well afterwards, feels usual state of health today,  declined EKG or cardiology follow up.   UTI, 07/19/22 urine culture E Coli, Nitrofurantoin 12m bid x 7 days.   Senile dementia, tolerated Namenda well, helped with her mood as well. MMSE 26/30 03/23/22,  TSH 4.35 06/21/22             Afib, on Metoprolol, Flecainide, Eliquis. Hgb 14.11/09/09/24             AS moderate             HTN on Metoprolol, Furosemide, intermittently elevated Sbp, f/u Cardiology.              Pacemaker, f/u Cardiology             CHF, edema BLE, on Furosemide. EF 70-75% 09/19/21, 03/27/22 Venous UKorea negative DVT. 03/30/22 arterial UKoreamild to moderate obstructive disease BLE             CKD Bun/creat 20/0.85 06/28/22             Dysphagia, nectar liquid.              Hypothyroidism, taking Levothyroxine, TSH 2.159 09/24/21             OA, takes Tylenol, Methocarbamol.              CT head 02/12/22 Mild age-related atrophy and chronic microvascular ischemic changes.             Hypokalemia, K 4.4 06/28/22    Past Medical History:  Diagnosis Date   Aortic stenosis 03/08/2016   Brady-tachy syndrome ([place]    a. Biotronik dual chamber  PPM implanted 2009 b. gen change to STJ dual chamber PPM 2017   Ejection fraction    EF 70%, echo, October, 2009  //   EF 55-60%, septal dyssynergy consistent with a paced rhythm, mild to moderate mitral regurgitation, echo, November, 2015    GERD (gastroesophageal reflux disease) 04/28/2014   Episodes November, 2015 with fluid refluxing from her esophagus.    Hair loss    Patient questioned Coumadin, changed toPradaxa   Hypertension    Hypothyroidism    Lower extremity edema 01/22/2018   Mitral regurgitation 05/03/2014   Mild-to-moderate mitral regurgitation, echo, November, 2015    Osteoarthritis of left knee 10/25/2017   Paroxysmal atrial fibrillation (HCC)    Episodes rapid atrial fib noted by pacemaker interrogation, August, 2011, diltiazem added, patient improved. Patient continues on Rythmol  //   Changed to flecainide 2013  //    flecainide level checked in 2013, good level    Persistent atrial fibrillation ([place])    Past Surgical History:  Procedure Laterality Date   ABDOMINAL HYSTERECTOMY     EP IMPLANTABLE DEVICE N/A 08/08/2015   Generator change with SJM Assurity DR PPM by Dr Rayann Heman   JOINT REPLACEMENT     PACEMAKER PLACEMENT  2009          Outpatient Encounter Medications as of 07/27/2022  Medication Sig   acetaminophen (TYLENOL) 325 MG tablet Take 2 tablets (650 mg total) by mouth every 6 (six) hours as needed.   acetaminophen (TYLENOL) 500 MG tablet Take 1 tablet (500 mg total) by mouth every 8 (eight) hours as needed. **DAW** Give 1000 mg by mouth two times a day for Pain   apixaban (ELIQUIS) 5 MG TABS tablet Take 1 tablet (5 mg total) by mouth 2 (two) times daily.   Apoaequorin (PREVAGEN) 10 MG CAPS Take 10 mg by mouth daily.   betamethasone, augmented, (DIPROLENE) 0.05 % gel Apply topically daily.  Apply to scalp topically every 12 hours as needed for psoriasis related to PSORIASIS, UNSPECIFIED (L40.9) apply to affected area of scalp   Calcium Carbonate-Vit D-Min (CALCIUM 600+D PLUS MINERALS) 600-400 MG-UNIT TABS Take 1 tablet by mouth daily.   Carboxymethylcell-Glycerin PF (REFRESH RELIEVA PF) 0.5-1 % SOLN Apply 1 drop to eye in the morning and at bedtime. for relieve discomfort   diclofenac Sodium (VOLTAREN) 1 % GEL Apply to left foot bunion deformit topically two times a day for Pain 1 gm   flecainide (TAMBOCOR) 50 MG tablet Take 1 tablet (50 mg total) by mouth 2 (two) times daily.   furosemide (LASIX) 20 MG tablet Take 1 tablet (20 mg total) by mouth daily.   hydrocortisone 2.5 % ointment Apply to affected areas topically as needed for Rashy/itchy areas two times daily-MAY SELF ADMINISTER-per pt request   levothyroxine (SYNTHROID) 100 MCG tablet Take 1 tablet (100 mcg total) by mouth every morning.   memantine (NAMENDA) 10 MG tablet Take 1 tablet (10 mg total) by mouth 2 (two) times daily. for  cognitive decline   metoprolol succinate (TOPROL-XL) 25 MG 24 hr tablet Take 3 tablets (75 mg total) by mouth daily.   Multiple Vitamin (MULTIVITAMIN ADULT) TABS Take 1 tablet by mouth daily.   Multiple Vitamins-Minerals (OCUVITE PO) Take 1 capsule by mouth daily.   nitrofurantoin, macrocrystal-monohydrate, (MACROBID) 100 MG capsule Take 100 mg by mouth 2 (two) times daily.   potassium chloride (KLOR-CON M) 10 MEQ tablet Take 1 tablet (10 mEq total) by mouth daily.   sodium chloride (MURO 128)  5 % ophthalmic ointment Place 1 Application into both eyes at bedtime.  for reduce swelling At least 1 hour after other drops   sodium chloride (MURO 128) 5 % ophthalmic solution Place 1 drop into both eyes in the morning and at bedtime. for decrease swelling Wait 1 hour between these and Refresh   methocarbamol (ROBAXIN) 500 MG tablet Take 1 tablet (500 mg total) by mouth 2 (two) times daily. (Patient not taking: Reported on 07/19/2022)   No facility-administered encounter medications on file as of 07/27/2022.    Review of Systems  Constitutional:  Negative for appetite change, fatigue and fever.  HENT:  Positive for trouble swallowing. Negative for congestion.   Eyes:  Negative for visual disturbance.  Respiratory:  Negative for cough and shortness of breath.   Cardiovascular:  Positive for leg swelling. Negative for chest pain and palpitations.  Gastrointestinal:  Negative for abdominal pain, constipation and vomiting.  Genitourinary:  Negative for dysuria, frequency and urgency.  Musculoskeletal:  Positive for arthralgias and gait problem.       Knees, L>R, intermittent in nature.   Skin:  Negative for color change.       Chronic venous insufficiency skin changes: mild erythema in mid of R+L lower legs, weak DP pulses.   Neurological:  Negative for speech difficulty, weakness and headaches.       Memory lapses.   Psychiatric/Behavioral:  Negative for behavioral problems, confusion and sleep  disturbance. The patient is not nervous/anxious.     Immunization History  Administered Date(s) Administered   Fluad Quad(high Dose 65+) 04/02/2022   Influenza Split 03/11/2009, 03/11/2010, 02/25/2011, 02/24/2019, 03/08/2020, 03/25/2021   Influenza, High Dose Seasonal PF 03/08/2015, 04/03/2017, 03/17/2018, 03/08/2020   Influenza,inj,Quad PF,6+ Mos 03/08/2016   Influenza,inj,quad, With Preservative 03/11/2017   Moderna Covid-19 Vaccine Bivalent Booster 90yr & up 10/27/2021   Moderna SARS-COV2 Booster Vaccination 11/08/2020   Moderna Sars-Covid-2 Vaccination 06/15/2019, 07/13/2019, 04/19/2020, 02/27/2022   Pfizer Covid-19 Vaccine Bivalent Booster 160yr& up 02/28/2021, 04/12/2022   Pneumococcal Conjugate-13 12/06/2014   Pneumococcal Polysaccharide-23 05/13/2001, 03/11/2009   Polio, Unspecified 02/02/1958   Tdap 11/21/2011   Pertinent  Health Maintenance Due  Topic Date Due   INFLUENZA VACCINE  Completed   DEXA SCAN  Completed      02/16/2022   12:00 AM 02/16/2022    9:45 AM 02/27/2022   11:41 AM 07/06/2022   10:04 AM 07/13/2022    3:55 PM  Fall Risk  Falls in the past year?    0 0  Was there an injury with Fall?    0 0  Fall Risk Category Calculator    0 0  (RETIRED) Patient Fall Risk Level High fall risk High fall risk High fall risk    Patient at Risk for Falls Due to   History of fall(s) No Fall Risks No Fall Risks  Fall risk Follow up   Falls evaluation completed  Falls evaluation completed   Functional Status Survey:    Vitals:   07/27/22 1115  BP: 126/70  Pulse: 65  Resp: 16  Temp: 97.7 F (36.5 C)  SpO2: 98%  Weight: 198 lb 3.2 oz (89.9 kg)  Height: 5' 6"$  (1.676 m)   Body mass index is 31.99 kg/m. Physical Exam Vitals and nursing note reviewed.  Constitutional:      Appearance: Normal appearance.  HENT:     Head: Normocephalic and atraumatic.     Nose: Nose normal.     Mouth/Throat:  Mouth: Mucous membranes are moist.  Eyes:     Extraocular Movements:  Extraocular movements intact.     Conjunctiva/sclera: Conjunctivae normal.     Pupils: Pupils are equal, round, and reactive to light.  Cardiovascular:     Rate and Rhythm: Normal rate. Rhythm irregular.     Heart sounds: Murmur heard.     Comments: No dorsalis pedis pulses felt from previous examination.  Pulmonary:     Effort: Pulmonary effort is normal.     Breath sounds: No rales.  Abdominal:     General: Bowel sounds are normal.     Palpations: Abdomen is soft.     Tenderness: There is no abdominal tenderness.  Musculoskeletal:        General: Normal range of motion.     Cervical back: Normal range of motion and neck supple.     Right lower leg: Edema present.     Left lower leg: Edema present.     Comments: Trace -1+ edema LLE>RLE  Skin:    General: Skin is warm and dry.     Findings: Erythema present.     Comments: Mild dark erythema mid of R+L lower legs, chronic.   Neurological:     General: No focal deficit present.     Mental Status: She is alert and oriented to person, place, and time. Mental status is at baseline.     Gait: Gait abnormal.  Psychiatric:        Mood and Affect: Mood normal.        Behavior: Behavior normal.     Labs reviewed: Recent Labs    09/24/21 1121 02/11/22 2351 02/14/22 0808 02/15/22 0444 03/09/22 1218 06/28/22 0000 07/05/22 0000  NA 142   < > 139 141 140 141 140  K 4.1   < > 3.9 3.4* 4.3 4.4 4.0  CL 107   < > 109 109 105 104 104  CO2 27   < > 26 25 25 $ 30* 28*  GLUCOSE 110*   < > 102* 100* 81  --   --   BUN 15   < > 19 15 20 20 21  $ CREATININE 0.93   < > 0.81 0.71 1.03* 0.9 0.9  CALCIUM 9.7   < > 8.4* 8.3* 9.8 9.5 9.2  MG 2.0  --  2.0 2.2  --   --   --    < > = values in this interval not displayed.   Recent Labs    02/11/22 2351 06/28/22 0000 07/05/22 0000  AST 35 29 23  ALT 17 23 18  $ ALKPHOS 44 46 44  BILITOT 1.1  --   --   PROT 6.7  --   --   ALBUMIN 3.5 3.9 3.8   Recent Labs    02/14/22 0415 02/14/22 0808  02/15/22 0444 06/25/22 0000  WBC 9.3 9.4 6.7 4.9  NEUTROABS  --  7.5 4.1 2,279.00  HGB 12.1 12.2 11.9* 14.6  HCT 37.3 36.8 36.4 44  MCV 98.9 97.9 97.6  --   PLT 97* 95* 110* 153   Lab Results  Component Value Date   TSH 4.35 06/25/2022   No results found for: "HGBA1C" Lab Results  Component Value Date   CHOL 193 05/13/2017   HDL 41 05/13/2017   LDLCALC 122 (H) 05/13/2017   TRIG 152 (H) 05/13/2017   CHOLHDL 4.7 (H) 05/13/2017    Significant Diagnostic Results in last 30 days:  [place]  Result Date: 07/13/2022 Scheduled remote reviewed. Normal device function.  1 atrial arrhythmia detected, 22sec in duration Next remote 91 days. LA   Assessment/Plan Persistent atrial fibrillation (HCC) reported the patient's c/o she could not feel her heart beat by placing her right hand over her chest, resolved before nurse visiting. Denied headache, dizziness, chest apin, SOB, palpitation, labored breathing, diaphoresis, or feeling of impending doom. The patient stated she sleep well afterwards, feels usual state of health today, declined EKG or cardiology follow up.  Heart rate is in control,  on Metoprolol, Flecainide, Eliquis. Hgb 14.11/09/09/24  UTI (urinary tract infection) 07/19/22 urine culture E Coli, Nitrofurantoin 12m bid x 7 days. Asymptomatic today.   Senile dementia ([place]  tolerated Namenda well, helped with her mood as well. MMSE 26/30 03/23/22,  TSH 4.35 06/21/22  Essential hypertension, benign Blood pressure is controlled, on Metoprolol, Furosemide, intermittently elevated Sbp, f/u Cardiolog  Congestive heart failure (HCC)  edema BLE, on Furosemide. EF 70-75% 09/19/21, 03/27/22 Venous UKorea negative DVT. 03/30/22 arterial UKoreamild to moderate obstructive disease BLE  Hypothyroidism  taking Levothyroxine, TSH 2.159 09/24/21     Family/ staff Communication: plan of care reviewed with the patient and charge nurse.   Labs/tests ordered:  none  Time  spend 40 minutes.

## 2022-07-31 NOTE — Progress Notes (Signed)
Remote pacemaker transmission.   

## 2022-08-21 ENCOUNTER — Ambulatory Visit (INDEPENDENT_AMBULATORY_CARE_PROVIDER_SITE_OTHER): Payer: Medicare Other | Admitting: Podiatry

## 2022-08-21 ENCOUNTER — Encounter: Payer: Self-pay | Admitting: Podiatry

## 2022-08-21 DIAGNOSIS — B351 Tinea unguium: Secondary | ICD-10-CM | POA: Diagnosis not present

## 2022-08-21 DIAGNOSIS — M79675 Pain in left toe(s): Secondary | ICD-10-CM

## 2022-08-21 DIAGNOSIS — M79676 Pain in unspecified toe(s): Secondary | ICD-10-CM

## 2022-08-21 NOTE — Progress Notes (Signed)
Subjective:  Patient ID: DELANNY KHO, female    DOB: 06/16/1927,  MRN: [id number] HPI Chief Complaint  Patient presents with   Toe Pain    Hallux left - both borders, ingrown intermittent x months, tender   Foot Pain    1st MPJ left - bunion deformity x years, tender at times    New Patient (Initial Visit)    89 y.o. female presents with the above complaint.   ROS: Denies fever chills nausea vomit muscle aches pains calf pain back pain chest pain shortness of breath.  Past Medical History:  Diagnosis Date   Aortic stenosis 03/08/2016   Brady-tachy syndrome ([place])    a. Biotronik dual chamber PPM implanted 2009 b. gen change to STJ dual chamber PPM 2017   Ejection fraction    EF 70%, echo, October, 2009  //   EF 55-60%, septal dyssynergy consistent with a paced rhythm, mild to moderate mitral regurgitation, echo, November, 2015    GERD (gastroesophageal reflux disease) 04/28/2014   Episodes November, 2015 with fluid refluxing from her esophagus.    Hair loss    Patient questioned Coumadin, changed toPradaxa   Hypertension    Hypothyroidism    Lower extremity edema 01/22/2018   Mitral regurgitation 05/03/2014   Mild-to-moderate mitral regurgitation, echo, November, 2015    Osteoarthritis of left knee 10/25/2017   Paroxysmal atrial fibrillation (HCC)    Episodes rapid atrial fib noted by pacemaker interrogation, August, 2011, diltiazem added, patient improved. Patient continues on Rythmol  //   Changed to flecainide 2013  //   flecainide level checked in 2013, good level    Persistent atrial fibrillation ([place])    Past Surgical History:  Procedure Laterality Date   ABDOMINAL HYSTERECTOMY     EP IMPLANTABLE DEVICE N/A 08/08/2015   Generator change with SJM Assurity DR PPM by Dr Rayann Heman   JOINT REPLACEMENT     PACEMAKER PLACEMENT  2009        Current Outpatient Medications:    acetaminophen (TYLENOL) 325 MG tablet, Take 2 tablets (650 mg total) by mouth every 6 (six) hours as  needed., Disp: 36 tablet, Rfl: 0   acetaminophen (TYLENOL) 500 MG tablet, Take 1 tablet (500 mg total) by mouth every 8 (eight) hours as needed. **DAW** Give 1000 mg by mouth two times a day for Pain, Disp: 60 tablet, Rfl: 2   apixaban (ELIQUIS) 5 MG TABS tablet, Take 1 tablet (5 mg total) by mouth 2 (two) times daily., Disp: 180 tablet, Rfl: 1   Apoaequorin (PREVAGEN) 10 MG CAPS, Take 10 mg by mouth daily., Disp: 90 capsule, Rfl: 5   betamethasone, augmented, (DIPROLENE) 0.05 % gel, Apply topically daily.  Apply to scalp topically every 12 hours as needed for psoriasis related to PSORIASIS, UNSPECIFIED (L40.9) apply to affected area of scalp, Disp: 60 g, Rfl: 2   Calcium Carbonate-Vit D-Min (CALCIUM 600+D PLUS MINERALS) 600-400 MG-UNIT TABS, Take 1 tablet by mouth daily., Disp: 60 tablet, Rfl: 5   Carboxymethylcell-Glycerin PF (REFRESH RELIEVA PF) 0.5-1 % SOLN, Apply 1 drop to eye in the morning and at bedtime. for relieve discomfort, Disp: 2 each, Rfl: 2   diclofenac Sodium (VOLTAREN) 1 % GEL, Apply to left foot bunion deformit topically two times a day for Pain 1 gm, Disp: , Rfl:    flecainide (TAMBOCOR) 50 MG tablet, Take 1 tablet (50 mg total) by mouth 2 (two) times daily., Disp: 180 tablet, Rfl: 3   furosemide (LASIX) 20 MG tablet,  Take 1 tablet (20 mg total) by mouth daily., Disp: 90 tablet, Rfl: 1   hydrocortisone 2.5 % ointment, Apply to affected areas topically as needed for Rashy/itchy areas two times daily-MAY SELF ADMINISTER-per pt request, Disp: , Rfl:    levothyroxine (SYNTHROID) 100 MCG tablet, Take 1 tablet (100 mcg total) by mouth every morning., Disp: 90 tablet, Rfl: 3   memantine (NAMENDA) 10 MG tablet, Take 1 tablet (10 mg total) by mouth 2 (two) times daily. for cognitive decline, Disp: 60 tablet, Rfl: 5   methocarbamol (ROBAXIN) 500 MG tablet, Take 1 tablet (500 mg total) by mouth 2 (two) times daily. (Patient not taking: Reported on 07/19/2022), Disp: 60 tablet, Rfl: 5    metoprolol succinate (TOPROL-XL) 25 MG 24 hr tablet, Take 3 tablets (75 mg total) by mouth daily., Disp: 270 tablet, Rfl: 3   Multiple Vitamin (MULTIVITAMIN ADULT) TABS, Take 1 tablet by mouth daily., Disp: , Rfl:    Multiple Vitamins-Minerals (OCUVITE PO), Take 1 capsule by mouth daily., Disp: , Rfl:    nitrofurantoin, macrocrystal-monohydrate, (MACROBID) 100 MG capsule, Take 100 mg by mouth 2 (two) times daily., Disp: , Rfl:    potassium chloride (KLOR-CON M) 10 MEQ tablet, Take 1 tablet (10 mEq total) by mouth daily., Disp: 90 tablet, Rfl: 3   sodium chloride (MURO 128) 5 % ophthalmic ointment, Place 1 Application into both eyes at bedtime.  for reduce swelling At least 1 hour after other drops, Disp: 3.5 g, Rfl: 2   sodium chloride (MURO 128) 5 % ophthalmic solution, Place 1 drop into both eyes in the morning and at bedtime. for decrease swelling Wait 1 hour between these and Refresh, Disp: 15 mL, Rfl: 5   Review of Systems Objective:  There were no vitals filed for this visit.  General: Well developed, nourished, in no acute distress, alert and oriented x3   Dermatological: Skin is warm, dry and supple bilateral. Nails x 10 are well maintained; hallux left does demonstrate sharp incurvated nail margins with thick callus material in the nail groove.  Remaining integument appears unremarkable at this time. There are no open sores, no preulcerative lesions, no rash or signs of infection present.  Vascular: Dorsalis Pedis artery and Posterior Tibial artery pedal pulses are 2/4 bilateral with immedate capillary fill time. Pedal hair growth present. No varicosities and no lower extremity edema present bilateral.   Neruologic: Grossly intact via light touch bilateral. Vibratory intact via tuning fork bilateral. Protective threshold with Semmes Wienstein monofilament intact to all pedal sites bilateral. Patellar and Achilles deep tendon reflexes 2+ bilateral. No Babinski or clonus noted bilateral.    Musculoskeletal: No gross boney pedal deformities bilateral. No pain, crepitus, or limitation noted with foot and ankle range of motion bilateral. Muscular strength 5/5 in all groups tested bilateral.  Hallux valgus deformity left foot.  Gait: Unassisted, Nonantalgic.    Radiographs:  None taken  Assessment & Plan:   Assessment: Nail dystrophy and pain in limb secondary onychomycosis bilateral   Plan: Debridement of toenails 1 through 5 bilateral.     Verlean [place]port T. [place], [place]

## 2022-08-24 ENCOUNTER — Encounter: Payer: Self-pay | Admitting: Nurse Practitioner

## 2022-08-24 DIAGNOSIS — W19XXXA Unspecified fall, initial encounter: Secondary | ICD-10-CM | POA: Insufficient documentation

## 2022-09-17 ENCOUNTER — Ambulatory Visit (HOSPITAL_COMMUNITY): Payer: Medicare Other | Attending: Cardiology

## 2022-09-17 DIAGNOSIS — I350 Nonrheumatic aortic (valve) stenosis: Secondary | ICD-10-CM | POA: Diagnosis not present

## 2022-09-17 LAB — ECHOCARDIOGRAM COMPLETE
AR max vel: 1.06 cm2
AV Area VTI: 0.99 cm2
AV Area mean vel: 1.08 cm2
AV Mean grad: 30 mmHg
AV Peak grad: 51.3 mmHg
Ao pk vel: 3.58 m/s
Area-P 1/2: 2.36 cm2
S' Lateral: 2.67 cm

## 2022-09-18 ENCOUNTER — Non-Acute Institutional Stay: Payer: Medicare Other | Admitting: Nurse Practitioner

## 2022-09-18 ENCOUNTER — Encounter: Payer: Self-pay | Admitting: Nurse Practitioner

## 2022-09-18 DIAGNOSIS — I480 Paroxysmal atrial fibrillation: Secondary | ICD-10-CM

## 2022-09-18 DIAGNOSIS — F0390 Unspecified dementia without behavioral disturbance: Secondary | ICD-10-CM

## 2022-09-18 DIAGNOSIS — R200 Anesthesia of skin: Secondary | ICD-10-CM | POA: Diagnosis not present

## 2022-09-18 DIAGNOSIS — I509 Heart failure, unspecified: Secondary | ICD-10-CM | POA: Diagnosis not present

## 2022-09-18 DIAGNOSIS — I10 Essential (primary) hypertension: Secondary | ICD-10-CM | POA: Diagnosis not present

## 2022-09-18 DIAGNOSIS — R1319 Other dysphagia: Secondary | ICD-10-CM

## 2022-09-18 DIAGNOSIS — Z950 Presence of cardiac pacemaker: Secondary | ICD-10-CM

## 2022-09-18 DIAGNOSIS — E039 Hypothyroidism, unspecified: Secondary | ICD-10-CM

## 2022-09-18 DIAGNOSIS — N1831 Chronic kidney disease, stage 3a: Secondary | ICD-10-CM | POA: Diagnosis not present

## 2022-09-18 NOTE — Progress Notes (Signed)
Location:   [facility] Room Number: 824A Place of Service:  AL[facility] (13) Provider: Arna Snipe Katrice Goel NP  [name], MD  Patient Care Team: [name], MD as PCP - General (Family Medicine) [name], MD (Inactive) as PCP - Electrophysiology (Cardiology) [name], MD as PCP - Cardiology (Cardiology) [name], RN as Case Manager Helaine Chess [name], FNP (Hospice and Palliative Medicine)  Extended Emergency Contact Information Primary Emergency Contact: [name] Address: [address], [place] Home Phone: [phone] Mobile Phone: [phone] Relation: Daughter Secondary Emergency Contact: Gae Dry Home Phone: [phone] Mobile Phone: [phone] Relation: Daughter Interpreter needed? No  Code Status: DNR Goals of care: Advanced Directive information    09/18/2022    1:53 PM  Advanced Directives  Does Patient Have a Medical Advance Directive? Yes  Type of Estate agent of [place];Out of facility DNR (pink MOST or yellow form)  Does patient want to make changes to medical advance directive? No - Patient declined  Copy of Healthcare Power of Attorney in Chart? Yes - validated most recent copy scanned in chart (See row information)  Pre-existing out of facility DNR order (yellow form or pink MOST form) Yellow form placed in chart (order not valid for inpatient use)     Chief Complaint  Patient presents with   Acute Visit    Numbness of fingers    HPI:  Pt is a 89 y.o. female seen today for an acute visit for c/o numbness and tingling in all her fingers. Now new, but experienced more often in the past a few weeks. The patient stated she is able button her shirts, playing piano, feeding self, no noticing symptoms when sleep at night. Denied neck pain or weakness in fingers.   Senile dementia, tolerated Namenda well, helped with her mood as well. MMSE 26/30 03/23/22,   TSH 4.35 06/25/22             Afib, on Metoprolol, Flecainide, Eliquis. Hgb 14.11/09/09/24             AS moderate             HTN on Metoprolol, Furosemide, intermittently elevated Sbp, f/u Cardiology.              Pacemaker, f/u Cardiology             CHF, edema BLE, on Furosemide. EF 70-75% 09/19/21, 03/27/22 Venous US, negative DVT. 03/30/22 arterial US mild to moderate obstructive disease BLE             CKD Bun/creat 21/0.9 07/05/22             Dysphagia, nectar liquid.              Hypothyroidism, taking Levothyroxine, TSH 4.35 06/25/22             OA, takes Tylenol              CT head 02/12/22 Mild age-related atrophy and chronic microvascular ischemic changes.             Hypokalemia, K 4.0 07/05/22     Past Medical History:  Diagnosis Date   Aortic stenosis 03/08/2016   Brady-tachy syndrome    a. Biotronik dual chamber PPM implanted 2009 b. gen change to STJ dual chamber PPM 2017   Ejection fraction    EF 70%, echo, October, 2009  //  EF 55-60%, septal dyssynergy consistent with a paced rhythm, mild to moderate mitral regurgitation, echo, November, 2015    GERD (gastroesophageal reflux disease) 04/28/2014   Episodes November, 2015 with fluid refluxing from her esophagus.    Hair loss    Patient questioned Coumadin, changed toPradaxa   Hypertension    Hypothyroidism    Lower extremity edema 01/22/2018   Mitral regurgitation 05/03/2014   Mild-to-moderate mitral regurgitation, echo, November, 2015    Osteoarthritis of left knee 10/25/2017   Paroxysmal atrial fibrillation    Episodes rapid atrial fib noted by pacemaker interrogation, August, 2011, diltiazem added, patient improved. Patient continues on Rythmol  //   Changed to flecainide 2013  //   flecainide level checked in 2013, good level    Persistent atrial fibrillation    Past Surgical History:  Procedure Laterality Date   ABDOMINAL HYSTERECTOMY     EP IMPLANTABLE DEVICE N/A 08/08/2015   Generator change with SJM [employer]  DR PPM by Dr Johney Frame   JOINT REPLACEMENT     PACEMAKER PLACEMENT  2009             Review of Systems  Constitutional:  Negative for appetite change, fatigue and fever.  HENT:  Positive for trouble swallowing. Negative for congestion.   Eyes:  Negative for visual disturbance.  Respiratory:  Negative for shortness of breath.   Cardiovascular:  Positive for leg swelling. Negative for chest pain and palpitations.  Gastrointestinal:  Negative for abdominal pain and constipation.  Genitourinary:  Negative for dysuria, frequency and urgency.  Musculoskeletal:  Positive for arthralgias and gait problem.       Knees, L>R, intermittent in nature.   Skin:  Negative for color change.       Chronic venous insufficiency skin changes: mild erythema in mid of R+L lower legs, weak DP pulses.   Neurological:  Positive for numbness. Negative for tremors, speech difficulty, weakness and headaches.       Memory lapses. Worsened numbness in fingers.   Psychiatric/Behavioral:  Negative for behavioral problems, confusion and sleep disturbance. The patient is not nervous/anxious.     Immunization History  Administered Date(s) Administered   Fluad Quad(high Dose 65+) 04/02/2022   Influenza Split 03/11/2009, 03/11/2010, 02/25/2011, 02/24/2019, 03/08/2020, 03/25/2021   Influenza, High Dose Seasonal PF 03/08/2015, 04/03/2017, 03/17/2018, 03/08/2020   Influenza,inj,Quad PF,6+ Mos 03/08/2016   Influenza,inj,quad, With Preservative 03/11/2017   Moderna Covid-19 Vaccine Bivalent Booster 48yrs & up 10/27/2021   Moderna SARS-COV2 Booster Vaccination 11/08/2020   Moderna Sars-Covid-2 Vaccination 06/15/2019, 07/13/2019, 04/19/2020, 02/27/2022   Pfizer Covid-19 Vaccine Bivalent Booster 36yrs & up 02/28/2021, 04/12/2022   Pneumococcal Conjugate-13 12/06/2014   Pneumococcal Polysaccharide-23 05/13/2001, 03/11/2009   Polio, Unspecified 02/02/1958   Tdap 11/21/2011   Pertinent  Health Maintenance Due  Topic Date  Due   INFLUENZA VACCINE  01/10/2023   DEXA SCAN  Completed      02/16/2022   12:00 AM 02/16/2022    9:45 AM 02/27/2022   11:41 AM 07/06/2022   10:04 AM 07/13/2022    3:55 PM  Fall Risk  Falls in the past year?    0 0  Was there an injury with Fall?    0 0  Fall Risk Category Calculator    0 0  (RETIRED) Patient Fall Risk Level High fall risk High fall risk High fall risk    Patient at Risk for Falls Due to   History of fall(s) No Fall Risks No Fall Risks  Fall risk  Follow up   Falls evaluation completed  Falls evaluation completed   Functional Status Survey:    Vitals:   09/18/22 1351  BP: (!) 138/52  Pulse: 61  Resp: 18  Temp: (!) 97.1 F (36.2 C)  SpO2: 94%  Weight: 203 lb 3.2 oz (92.2 kg)  Height: 5\' 6"  (1.676 m)   Body mass index is 32.8 kg/m. Physical Exam Vitals and nursing note reviewed.  Constitutional:      Appearance: Normal appearance.  HENT:     Head: Normocephalic and atraumatic.     Nose: Nose normal.     Mouth/Throat:     Mouth: Mucous membranes are moist.  Eyes:     Extraocular Movements: Extraocular movements intact.     Conjunctiva/sclera: Conjunctivae normal.     Pupils: Pupils are equal, round, and reactive to light.  Cardiovascular:     Rate and Rhythm: Normal rate. Rhythm irregular.     Heart sounds: Murmur heard.     Comments: No dorsalis pedis pulses felt from previous examination.  Pulmonary:     Effort: Pulmonary effort is normal.     Breath sounds: No rales.  Abdominal:     General: Bowel sounds are normal.     Palpations: Abdomen is soft.     Tenderness: There is no abdominal tenderness.  Musculoskeletal:        General: Normal range of motion.     Cervical back: Normal range of motion and neck supple. No rigidity or tenderness.     Right lower leg: Edema present.     Left lower leg: Edema present.     Comments: Trace -1+ edema LLE>RLE  Lymphadenopathy:     Cervical: No cervical adenopathy.  Skin:    General: Skin is warm and  dry.     Findings: Erythema present.     Comments: Mild dark erythema mid of R+L lower legs, chronic.   Neurological:     General: No focal deficit present.     Mental Status: She is alert and oriented to person, place, and time. Mental status is at baseline.     Cranial Nerves: No cranial nerve deficit.     Motor: No weakness.     Coordination: Coordination normal.     Gait: Gait abnormal.  Psychiatric:        Mood and Affect: Mood normal.        Behavior: Behavior normal.     Labs reviewed: Recent Labs    09/24/21 1121 02/11/22 2351 02/14/22 0808 02/15/22 0444 03/09/22 1218 06/28/22 0000 07/05/22 0000  NA 142   < > 139 141 140 141 140  K 4.1   < > 3.9 3.4* 4.3 4.4 4.0  CL 107   < > 109 109 105 104 104  CO2 27   < > 26 25 25  30* 28*  GLUCOSE 110*   < > 102* 100* 81  --   --   BUN 15   < > 19 15 20 20 21   CREATININE 0.93   < > 0.81 0.71 1.03* 0.9 0.9  CALCIUM 9.7   < > 8.4* 8.3* 9.8 9.5 9.2  MG 2.0  --  2.0 2.2  --   --   --    < > = values in this interval not displayed.   Recent Labs    02/11/22 2351 06/28/22 0000 07/05/22 0000  AST 35 29 23  ALT 17 23 18   ALKPHOS 44 46 44  BILITOT 1.1  --   --  PROT 6.7  --   --   ALBUMIN 3.5 3.9 3.8   Recent Labs    02/14/22 0415 02/14/22 0808 02/15/22 0444 06/25/22 0000  WBC 9.3 9.4 6.7 4.9  NEUTROABS  --  7.5 4.1 2,279.00  HGB 12.1 12.2 11.9* 14.6  HCT 37.3 36.8 36.4 44  MCV 98.9 97.9 97.6  --   PLT 97* 95* 110* 153   Lab Results  Component Value Date   TSH 4.35 06/25/2022   No results found for: "HGBA1C" Lab Results  Component Value Date   CHOL 193 05/13/2017   HDL 41 05/13/2017   LDLCALC 122 (H) 05/13/2017   TRIG 152 (H) 05/13/2017   CHOLHDL 4.7 (H) 05/13/2017    Significant Diagnostic Results in last 30 days:  ECHOCARDIOGRAM COMPLETE  Result Date: 09/17/2022    ECHOCARDIOGRAM REPORT   Patient Name:   [name]  Date of Exam: 09/17/2022 Medical Rec #:  [id number]     Height:       66.0 in  Accession #:    [id number]    Weight:       198.2 lb Date of Birth:  [birth year]     BSA:          1.992 m Patient Age:    89 years      BP:           167/73 mmHg Patient Gender: F             HR:           61 bpm. Exam Location:  [address] Procedure: 2D Echo, Cardiac Doppler, Color Doppler and Strain Analysis Indications:    I35.0 Aortic Stenosis  History:        Patient has prior history of Echocardiogram examinations, most                 recent 09/19/2021. Pacemaker, Arrythmias:Atrial Fibrillation;                 Risk Factors:Hypertension.  Sonographer:    Clearence Ped RCS Referring Phys: [id number] Zachary George SWINYER IMPRESSIONS  1. Left ventricular ejection fraction, by estimation, is 70 to 75%. The left ventricle has hyperdynamic function. The left ventricle has no regional wall motion abnormalities. There is severe left ventricular hypertrophy of the septal segment. Left ventricular diastolic parameters are consistent with Grade I diastolic dysfunction (impaired relaxation). The average left ventricular global longitudinal strain is -15.1 %. The global longitudinal strain is normal.  2. Right ventricular systolic function is normal. The right ventricular size is normal. There is normal pulmonary artery systolic pressure. The estimated right ventricular systolic pressure is 34.4 mmHg.  3. The mitral valve is normal in structure. Mild mitral valve regurgitation. No evidence of mitral stenosis. Severe mitral annular calcification.  4. The aortic valve is tricuspid. There is severe calcifcation of the aortic valve. There is severe thickening of the aortic valve. Aortic valve regurgitation is not visualized. Moderate to severe aortic valve stenosis. Aortic valve area, by VTI measures 0.99 cm. Aortic valve mean gradient measures 30.0 mmHg. Aortic valve Vmax measures 3.58 m/s.  5. The inferior vena cava is normal in size with greater than 50% respiratory variability, suggesting right atrial pressure of 3 mmHg.  Comparison(s): Prior images reviewed side by side. Prior AV mean gradient 26 mmHg 2023. FINDINGS  Left Ventricle: Left ventricular ejection fraction, by estimation, is 70 to 75%. The left ventricle has hyperdynamic function. The left ventricle has no regional  wall motion abnormalities. The average left ventricular global longitudinal strain is -15.1  %. The global longitudinal strain is normal. The left ventricular internal cavity size was normal in size. There is severe left ventricular hypertrophy of the septal segment. Left ventricular diastolic parameters are consistent with Grade I diastolic dysfunction (impaired relaxation). Right Ventricle: The right ventricular size is normal. No increase in right ventricular wall thickness. Right ventricular systolic function is normal. There is normal pulmonary artery systolic pressure. The tricuspid regurgitant velocity is 2.80 m/s, and  with an assumed right atrial pressure of 3 mmHg, the estimated right ventricular systolic pressure is 34.4 mmHg. Left Atrium: Left atrial size was normal in size. Right Atrium: Right atrial size was normal in size. Pericardium: There is no evidence of pericardial effusion. Mitral Valve: The mitral valve is normal in structure. Severe mitral annular calcification. Mild mitral valve regurgitation. No evidence of mitral valve stenosis. Tricuspid Valve: The tricuspid valve is normal in structure. Tricuspid valve regurgitation is mild . No evidence of tricuspid stenosis. Aortic Valve: The aortic valve is tricuspid. There is severe calcifcation of the aortic valve. There is severe thickening of the aortic valve. Aortic valve regurgitation is not visualized. Moderate to severe aortic stenosis is present. Aortic valve mean gradient measures 30.0 mmHg. Aortic valve peak gradient measures 51.3 mmHg. Aortic valve area, by VTI measures 0.99 cm. Pulmonic Valve: The pulmonic valve was normal in structure. Pulmonic valve regurgitation is not  visualized. No evidence of pulmonic stenosis. Aorta: The aortic root is normal in size and structure. Venous: The inferior vena cava is normal in size with greater than 50% respiratory variability, suggesting right atrial pressure of 3 mmHg. IAS/Shunts: No atrial level shunt detected by color flow Doppler. Additional Comments: A device lead is visualized in the right ventricle and right atrium.  LEFT VENTRICLE PLAX 2D LVIDd:         3.60 cm   Diastology LVIDs:         2.67 cm   LV e' medial:    5.55 cm/s LV PW:         1.40 cm   LV E/e' medial:  18.0 LV IVS:        1.80 cm   LV e' lateral:   7.18 cm/s LVOT diam:     1.90 cm   LV E/e' lateral: 13.9 LV SV:         90 LV SV Index:   45        2D Longitudinal Strain LVOT Area:     2.84 cm  2D Strain GLS (A2C):   -14.6 %                          2D Strain GLS (A3C):   -13.1 %                          2D Strain GLS (A4C):   -17.7 %                          2D Strain GLS Avg:     -15.1 % RIGHT VENTRICLE RV Basal diam:  3.50 cm RV S prime:     11.10 cm/s TAPSE (M-mode): 2.2 cm RVSP:           34.4 mmHg LEFT ATRIUM             Index  RIGHT ATRIUM           Index LA diam:        5.00 cm 2.51 cm/m   RA Pressure: 3.00 mmHg LA Vol (A2C):   60.9 ml 30.57 ml/m  RA Area:     13.00 cm LA Vol (A4C):   62.6 ml 31.42 ml/m  RA Volume:   33.30 ml  16.72 ml/m LA Biplane Vol: 65.2 ml 32.73 ml/m  AORTIC VALVE AV Area (Vmax):    1.06 cm AV Area (Vmean):   1.08 cm AV Area (VTI):     0.99 cm AV Vmax:           358.00 cm/s AV Vmean:          251.000 cm/s AV VTI:            0.904 m AV Peak Grad:      51.3 mmHg AV Mean Grad:      30.0 mmHg LVOT Vmax:         134.00 cm/s LVOT Vmean:        95.500 cm/s LVOT VTI:          0.317 m LVOT/AV VTI ratio: 0.35  AORTA Ao Root diam: 3.20 cm MITRAL VALVE                TRICUSPID VALVE MV Area (PHT):              TR Peak grad:   31.4 mmHg MV Decel Time:              TR Vmax:        280.00 cm/s MV E velocity: 100.00 cm/s  Estimated RAP:  3.00  mmHg MV A velocity: 115.00 cm/s  RVSP:           34.4 mmHg MV E/A ratio:  0.87                             SHUNTS                             Systemic VTI:  0.32 m                             Systemic Diam: 1.90 cm Donato Schultz MD Electronically signed by Donato Schultz MD Signature Date/Time: 09/17/2022/4:36:48 PM    Final     Assessment/Plan: Numbness of fingers of both hands numbness and tingling in all her fingers. Now new, but experienced more often in the past a few weeks. The patient stated she is able button her shirts, playing piano, feeding self, no noticing symptoms when sleep at night. Denied neck pain or weakness in fingers.  Will try splint R+L wrist at night May consider X-[place] cervical spine is no better.   Senile dementia (HCC) tolerated Namenda well, helped with her mood as well. MMSE 26/30 03/23/22,  TSH 4.35 06/25/22  Paroxysmal atrial fibrillation (HCC) on Metoprolol, Flecainide, Eliquis. Hgb 14.11/09/09/24  Hypertension Blood pressure is controlled, on Metoprolol, Furosemide, intermittently elevated Sbp, f/u Cardiology.   Pacemaker f/u Cardiology  Congestive heart failure (HCC) edema BLE, on Furosemide. EF 70-75% 09/19/21, 03/27/22 Venous US, negative DVT. 03/30/22 arterial US mild to moderate obstructive disease BLE  CKD (chronic kidney disease) stage 3, GFR 30-59 ml/min (HCC) Bun/creat 21/0.9 07/05/22  Esophageal dysphagia nectar liquid.   Hypothyroidism  taking Levothyroxine, TSH 4.35 06/25/22    Family/ staff Communication: plan of care reviewed with the patient and charge nurse.   Labs/tests ordered: none  Time spend 40 minutes.

## 2022-09-18 NOTE — Assessment & Plan Note (Signed)
numbness and tingling in all her fingers. Now new, but experienced more often in the past a few weeks. The patient stated she is able button her shirts, playing piano, feeding self, no noticing symptoms when sleep at night. Denied neck pain or weakness in fingers.  Will try splint R+L wrist at night May consider X-Ringgenberg cervical spine is no better.

## 2022-09-18 NOTE — Assessment & Plan Note (Signed)
Bun/creat 21/0.9 07/05/22

## 2022-09-18 NOTE — Assessment & Plan Note (Signed)
f/u Cardiology 

## 2022-09-18 NOTE — Assessment & Plan Note (Signed)
tolerated Namenda well, helped with her mood as well. MMSE 26/30 03/23/22,  TSH 4.35 06/25/22

## 2022-09-18 NOTE — Assessment & Plan Note (Signed)
Blood pressure is controlled, on Metoprolol, Furosemide, intermittently elevated Sbp, f/u Cardiology.  

## 2022-09-18 NOTE — Assessment & Plan Note (Signed)
on Metoprolol, Flecainide, Eliquis. Hgb 14.11/09/09/24

## 2022-09-18 NOTE — Assessment & Plan Note (Signed)
taking Levothyroxine, TSH 4.35 06/25/22

## 2022-09-18 NOTE — Assessment & Plan Note (Signed)
nectar liquid.  

## 2022-09-18 NOTE — Assessment & Plan Note (Signed)
edema BLE, on Furosemide. EF 70-75% 09/19/21, 03/27/22 Venous US, negative DVT. 03/30/22 arterial US mild to moderate obstructive disease BLE 

## 2022-09-24 DIAGNOSIS — R29898 Other symptoms and signs involving the musculoskeletal system: Secondary | ICD-10-CM | POA: Diagnosis not present

## 2022-10-04 DIAGNOSIS — R29898 Other symptoms and signs involving the musculoskeletal system: Secondary | ICD-10-CM | POA: Diagnosis not present

## 2022-10-09 ENCOUNTER — Other Ambulatory Visit: Payer: Self-pay | Admitting: Family Medicine

## 2022-10-09 DIAGNOSIS — R29898 Other symptoms and signs involving the musculoskeletal system: Secondary | ICD-10-CM | POA: Diagnosis not present

## 2022-10-09 NOTE — Telephone Encounter (Signed)
Patient medication has warnings. Medication pend and sent to PCP Hyacinth Meeker Bertram Millard, MD for approval.

## 2022-10-11 DIAGNOSIS — R29898 Other symptoms and signs involving the musculoskeletal system: Secondary | ICD-10-CM | POA: Diagnosis not present

## 2022-10-12 ENCOUNTER — Ambulatory Visit (INDEPENDENT_AMBULATORY_CARE_PROVIDER_SITE_OTHER): Payer: Medicare Other

## 2022-10-12 DIAGNOSIS — I495 Sick sinus syndrome: Secondary | ICD-10-CM | POA: Diagnosis not present

## 2022-10-12 LAB — CUP PACEART REMOTE DEVICE CHECK
Battery Remaining Longevity: 46 mo
Battery Remaining Percentage: 37 %
Battery Voltage: 2.96 V
Brady Statistic AP VP Percent: 1 %
Brady Statistic AP VS Percent: 97 %
Brady Statistic AS VP Percent: 1 %
Brady Statistic AS VS Percent: 2 %
Brady Statistic RA Percent Paced: 98 %
Brady Statistic RV Percent Paced: 1 %
Date Time Interrogation Session: 20240503040012
Implantable Lead Connection Status: 753985
Implantable Lead Connection Status: 753985
Implantable Lead Implant Date: 20091109
Implantable Lead Implant Date: 20091109
Implantable Lead Location: 753859
Implantable Lead Location: 753860
Implantable Lead Model: 350
Implantable Lead Serial Number: 24891460
Implantable Lead Serial Number: 28411861
Implantable Pulse Generator Implant Date: 20170227
Lead Channel Impedance Value: 1350 Ohm
Lead Channel Impedance Value: 480 Ohm
Lead Channel Pacing Threshold Amplitude: 0.75 V
Lead Channel Pacing Threshold Amplitude: 1 V
Lead Channel Pacing Threshold Pulse Width: 0.4 ms
Lead Channel Pacing Threshold Pulse Width: 0.4 ms
Lead Channel Sensing Intrinsic Amplitude: 12 mV
Lead Channel Sensing Intrinsic Amplitude: 2.1 mV
Lead Channel Setting Pacing Amplitude: 2 V
Lead Channel Setting Pacing Amplitude: 2.5 V
Lead Channel Setting Pacing Pulse Width: 0.4 ms
Lead Channel Setting Sensing Sensitivity: 2 mV
Pulse Gen Model: 2240
Pulse Gen Serial Number: 7885781

## 2022-10-23 ENCOUNTER — Non-Acute Institutional Stay: Payer: Medicare Other | Admitting: Family Medicine

## 2022-10-23 DIAGNOSIS — I359 Nonrheumatic aortic valve disorder, unspecified: Secondary | ICD-10-CM | POA: Diagnosis not present

## 2022-10-23 DIAGNOSIS — E039 Hypothyroidism, unspecified: Secondary | ICD-10-CM

## 2022-10-23 DIAGNOSIS — I10 Essential (primary) hypertension: Secondary | ICD-10-CM | POA: Diagnosis not present

## 2022-10-23 DIAGNOSIS — Z7901 Long term (current) use of anticoagulants: Secondary | ICD-10-CM

## 2022-10-23 DIAGNOSIS — I495 Sick sinus syndrome: Secondary | ICD-10-CM

## 2022-10-23 DIAGNOSIS — G3184 Mild cognitive impairment, so stated: Secondary | ICD-10-CM | POA: Diagnosis not present

## 2022-10-23 DIAGNOSIS — I480 Paroxysmal atrial fibrillation: Secondary | ICD-10-CM | POA: Diagnosis not present

## 2022-10-23 NOTE — Progress Notes (Unsigned)
Provider:  Jacalyn Lefevre, MD Location:   [facility] [place]   Place of Service:     PCP: [name], MD Patient Care Team: [name], MD as PCP - General (Family Medicine) [name], MD (Inactive) as PCP - Electrophysiology (Cardiology) [name], MD as PCP - Cardiology (Cardiology) [name], RN as Case Manager Helaine Chess [name], FNP (Hospice and Palliative Medicine)  Extended Emergency Contact Information Primary Emergency Contact: [name] Address: [address], [place] Home Phone: [phone] Mobile Phone: [phone] Relation: Daughter Secondary Emergency Contact: Gae Dry Home Phone: [phone] Mobile Phone: [phone] Relation: Daughter Interpreter needed? No  Code Status:  Goals of Care: Advanced Directive information    09/18/2022    1:53 PM  Advanced Directives  Does Patient Have a Medical Advance Directive? Yes  Type of Estate agent of [place];Out of facility DNR (pink MOST or yellow form)  Does patient want to make changes to medical advance directive? No - Patient declined  Copy of Healthcare Power of Attorney in Chart? Yes - validated most recent copy scanned in chart (See row information)  Pre-existing out of facility DNR order (yellow form or pink MOST form) Yellow form placed in chart (order not valid for inpatient use)      No chief complaint on file.   HPI: Patient is a 89 y.o. female seen today for medical management of chronic problems including paroxysmal atrial fibs, bradycardia tacky syndrome with dual-chamber pacemaker, aortic stenosis, and senile dementia. She reports no new problems since her last visit.  She was seen acutely for some numbness in her fingers at night and suspicious for carpal tunnel syndrome. She denies any recent falls.  Has good appetite.  Sleeps well. Regarding her dementia she did not recall my name but  remembered that she had seen me before.  I asked her to give me a quick piano lesson on key transition and we walked down to the PN her room and she had no trouble explaining the answer to my question. She continues on metoprolol flecainide and Eliquis for her A-fib .  She does have a pacemaker that has been interrogated recently by cardiology with normal function. Past Medical History:  Diagnosis Date   Aortic stenosis 03/08/2016   Brady-tachy syndrome (HCC)    a. Biotronik dual chamber PPM implanted 2009 b. gen change to STJ dual chamber PPM 2017   Ejection fraction    EF 70%, echo, October, 2009  //   EF 55-60%, septal dyssynergy consistent with a paced rhythm, mild to moderate mitral regurgitation, echo, November, 2015    GERD (gastroesophageal reflux disease) 04/28/2014   Episodes November, 2015 with fluid refluxing from her esophagus.    Hair loss    Patient questioned Coumadin, changed toPradaxa   Hypertension    Hypothyroidism    Lower extremity edema 01/22/2018   Mitral regurgitation 05/03/2014   Mild-to-moderate mitral regurgitation, echo, November, 2015    Osteoarthritis of left knee 10/25/2017   Paroxysmal atrial fibrillation (HCC)    Episodes rapid atrial fib noted by pacemaker interrogation, August, 2011, diltiazem added, patient improved. Patient continues on Rythmol  //   Changed to flecainide 2013  //   flecainide level checked in 2013, good level    Persistent atrial fibrillation (HCC)    Past Surgical History:  Procedure Laterality Date   ABDOMINAL HYSTERECTOMY     EP IMPLANTABLE  DEVICE N/A 08/08/2015   Generator change with [employer] DR PPM by Dr Johney Frame   JOINT REPLACEMENT     PACEMAKER PLACEMENT  2009        reports that she quit smoking about 32 years ago. Her smoking use included cigarettes. She has never used smokeless tobacco. She reports that she does not drink alcohol and does not use drugs. Social History   Socioeconomic History   Marital status:  Widowed    Spouse name: Not on file   Number of children: Not on file   Years of education: Not on file   Highest education level: Not on file  Occupational History   Not on file  Tobacco Use   Smoking status: Former    Types: Cigarettes    Quit date: 09/06/1990    Years since quitting: 32.1   Smokeless tobacco: Never  Vaping Use   Vaping Use: Never used  Substance and Sexual Activity   Alcohol use: No   Drug use: No   Sexual activity: Not on file  Other Topics Concern   Not on file  Social History Narrative   Not on file   Social Determinants of Health   Financial Resource Strain: Not on file  Food Insecurity: No Food Insecurity (02/15/2022)   Hunger Vital Sign    Worried About Running Out of Food in the Last Year: Never true    Ran Out of Food in the Last Year: Never true  Transportation Needs: No Transportation Needs (02/15/2022)   PRAPARE - Administrator, Civil Service (Medical): No    Lack of Transportation (Non-Medical): No  Physical Activity: Not on file  Stress: Not on file  Social Connections: Not on file  Intimate Partner Violence: Not At Risk (02/15/2022)   Humiliation, Afraid, Rape, and Kick questionnaire    Fear of Current or Ex-Partner: No    Emotionally Abused: No    Physically Abused: No    Sexually Abused: No    Functional Status Survey:    Family History  Problem Relation Age of Onset   Heart disease Father    Coronary artery disease Other    Heart disease Son     Health Maintenance  Topic Date Due   Zoster Vaccines- Shingrix (1 of 2) Never done   DTaP/Tdap/Td (2 - Td or Tdap) 11/20/2021   COVID-19 Vaccine (7 - 2023-24 season) 06/07/2022   INFLUENZA VACCINE  01/10/2023   Medicare Annual Wellness (AWV)  07/14/2023   Pneumonia Vaccine 23+ Years old  Completed   DEXA SCAN  Completed   HPV VACCINES  Aged Out    Allergies  Allergen Reactions   Morphine And Related Other (See Comments)    Patient states that she went "crazy" with  this   Penicillins Swelling and Rash    Has patient had a PCN reaction causing immediate rash, facial/tongue/throat swelling, SOB or lightheadedness with hypotension: {Yes/No:30480221} Has patient had a PCN reaction causing severe rash involving mucus membranes or skin necrosis: {Yes/No:30480221} Has patient had a PCN reaction that required hospitalization {Yes/No:30480221} Has patient had a PCN reaction occurring within the last 10 years: {Yes/No:30480221} If all of the above answers are "NO", then may proceed with Cephalosporin use.   Lasix [Furosemide] Itching    Patient stated it made her itch all over and dry.   Aspirin     Pacemaker   Clindamycin/Lincomycin Other (See Comments)    Any meds with mycin in the name Unknown reaction  Crestor [Rosuvastatin]     Other reaction(s): muscle pain   Diltiazem     Skin discoloration, lower extremity swelling   Erythromycin Other (See Comments)    Any meds with mycin in the name Unknown reaction   Lisinopril Other (See Comments)    REACTION: Cough   Metronidazole     Other reaction(s): mouth sores, tongue swelling   Penicillins Cross Reactors Other (See Comments)    Any meds with mycin in the name Unknown reaction    Outpatient Encounter Medications as of 10/23/2022  Medication Sig   acetaminophen (TYLENOL) 325 MG tablet Take 2 tablets (650 mg total) by mouth every 6 (six) hours as needed.   acetaminophen (TYLENOL) 500 MG tablet Take 1 tablet (500 mg total) by mouth every 8 (eight) hours as needed. **DAW** Give 1000 mg by mouth two times a day for Pain   apixaban (ELIQUIS) 5 MG TABS tablet Take 1 tablet (5 mg total) by mouth 2 (two) times daily.   Apoaequorin (PREVAGEN) 10 MG CAPS Take 10 mg by mouth daily.   betamethasone, augmented, (DIPROLENE) 0.05 % gel Apply topically daily.  Apply to scalp topically every 12 hours as needed for psoriasis related to PSORIASIS, UNSPECIFIED (L40.9) apply to affected area of scalp   Calcium  Carbonate-Vit D-Min (CALCIUM 600+D PLUS MINERALS) 600-400 MG-UNIT TABS Take 1 tablet by mouth daily.   Carboxymethylcell-Glycerin PF (REFRESH RELIEVA PF) 0.5-1 % SOLN Apply 1 drop to eye in the morning and at bedtime. for relieve discomfort   diclofenac Sodium (VOLTAREN) 1 % GEL Apply to left foot bunion deformit topically two times a day for Pain 1 gm   flecainide (TAMBOCOR) 50 MG tablet Take 1 tablet (50 mg total) by mouth 2 (two) times daily.   furosemide (LASIX) 20 MG tablet TAKE ONE TABLET BY MOUTH EVERY DAY   hydrocortisone 2.5 % ointment Apply to affected areas topically as needed for Rashy/itchy areas two times daily-MAY SELF ADMINISTER-per pt request   levothyroxine (SYNTHROID) 100 MCG tablet Take 1 tablet (100 mcg total) by mouth every morning.   memantine (NAMENDA) 10 MG tablet Take 1 tablet (10 mg total) by mouth 2 (two) times daily. for cognitive decline   metoprolol succinate (TOPROL-XL) 25 MG 24 hr tablet Take 3 tablets (75 mg total) by mouth daily.   Multiple Vitamin (MULTIVITAMIN ADULT) TABS Take 1 tablet by mouth daily.   Multiple Vitamins-Minerals (OCUVITE PO) Take 1 capsule by mouth daily.   potassium chloride (KLOR-CON M) 10 MEQ tablet Take 1 tablet (10 mEq total) by mouth daily.   sodium chloride (MURO 128) 5 % ophthalmic ointment Place 1 Application into both eyes at bedtime.  for reduce swelling At least 1 hour after other drops   sodium chloride (MURO 128) 5 % ophthalmic solution Place 1 drop into both eyes in the morning and at bedtime. for decrease swelling Wait 1 hour between these and Refresh   No facility-administered encounter medications on file as of 10/23/2022.    Review of Systems  Constitutional: Negative.   HENT: Negative.    Respiratory: Negative.    Cardiovascular: Negative.   Gastrointestinal: Negative.   Musculoskeletal:  Positive for gait problem.  Psychiatric/Behavioral:  Positive for confusion.   All other systems reviewed and are  negative.   There were no vitals filed for this visit. There is no height or weight on file to calculate BMI. Physical Exam Vitals and nursing note reviewed.  Constitutional:      Appearance: Normal  appearance.  HENT:     Mouth/Throat:     Pharynx: Oropharynx is clear.  Eyes:     Extraocular Movements: Extraocular movements intact.  Cardiovascular:     Rate and Rhythm: Normal rate and regular rhythm.     Heart sounds: Murmur heard.  Pulmonary:     Effort: Pulmonary effort is normal.     Breath sounds: Normal breath sounds.  Abdominal:     General: Bowel sounds are normal.     Palpations: Abdomen is soft.  Musculoskeletal:        General: Swelling present.     Right lower leg: Edema present.     Left lower leg: Edema present.     Comments: Uses walker for ambulation.  Has good safety awareness Trace edema  Skin:    General: Skin is warm and dry.  Neurological:     General: No focal deficit present.     Mental Status: She is alert and oriented to person, place, and time.     Labs reviewed: Basic Metabolic Panel: Recent Labs    02/14/22 0808 02/15/22 0444 03/09/22 1218 06/28/22 0000 07/05/22 0000  NA 139 141 140 141 140  K 3.9 3.4* 4.3 4.4 4.0  CL 109 109 105 104 104  CO2 26 25 25  30* 28*  GLUCOSE 102* 100* 81  --   --   BUN 19 15 20 20 21   CREATININE 0.81 0.71 1.03* 0.9 0.9  CALCIUM 8.4* 8.3* 9.8 9.5 9.2  MG 2.0 2.2  --   --   --    Liver Function Tests: Recent Labs    02/11/22 2351 06/28/22 0000 07/05/22 0000  AST 35 29 23  ALT 17 23 18   ALKPHOS 44 46 44  BILITOT 1.1  --   --   PROT 6.7  --   --   ALBUMIN 3.5 3.9 3.8   No results for input(s): "LIPASE", "AMYLASE" in the last 8760 hours. No results for input(s): "AMMONIA" in the last 8760 hours. CBC: Recent Labs    02/14/22 0415 02/14/22 0808 02/15/22 0444 06/25/22 0000  WBC 9.3 9.4 6.7 4.9  NEUTROABS  --  7.5 4.1 2,279.00  HGB 12.1 12.2 11.9* 14.6  HCT 37.3 36.8 36.4 44  MCV 98.9 97.9  97.6  --   PLT 97* 95* 110* 153   Cardiac Enzymes: No results for input(s): "CKTOTAL", "CKMB", "CKMBINDEX", "TROPONINI" in the last 8760 hours. BNP: Invalid input(s): "POCBNP" No results found for: "HGBA1C" Lab Results  Component Value Date   TSH 4.35 06/25/2022   No results found for: "VITAMINB12" No results found for: "FOLATE" No results found for: "IRON", "TIBC", "FERRITIN"  Imaging and Procedures obtained prior to SNF admission: CUP PACEART REMOTE DEVICE CHECK  Result Date: 07/13/2022 Scheduled remote reviewed. Normal device function.  1 atrial arrhythmia detected, 22sec in duration Next remote 91 days. LA   Assessment/Plan 1. Aortic valve disorder Follows with TAVR team 2. Brady-tachy syndrome [facility]) Pacemaker interrogated recently appears to be functioning well.  Followed by cardiology  3. Chronic anticoagulation Continue with A-fib  4. Primary hypertension On metoprolol for blood pressure as well as heart rhythm 5. Hypothyroidism, unspecified type Taking levothyroxine 100 mcg.  TSH is therapeutic  6. Mild cognitive impairment Patient able to converse normally and answer moderately complex questions as it relates to her expertise on the PNI  7. Paroxysmal atrial fibrillation (HCC) Appears to be in sinus rhythm today on combination of flecainide and metoprolol  Family/ staff Communication:   Labs/tests ordered:none  Bertram Millard. Hyacinth Meeker, MD [facility] [address] [place], [place] [address] Office [phone]

## 2022-10-26 ENCOUNTER — Non-Acute Institutional Stay: Payer: Medicare Other | Admitting: Nurse Practitioner

## 2022-10-26 ENCOUNTER — Encounter: Payer: Self-pay | Admitting: Nurse Practitioner

## 2022-10-26 DIAGNOSIS — I509 Heart failure, unspecified: Secondary | ICD-10-CM | POA: Diagnosis not present

## 2022-10-26 DIAGNOSIS — G3184 Mild cognitive impairment, so stated: Secondary | ICD-10-CM | POA: Diagnosis not present

## 2022-10-26 DIAGNOSIS — I495 Sick sinus syndrome: Secondary | ICD-10-CM

## 2022-10-26 DIAGNOSIS — E039 Hypothyroidism, unspecified: Secondary | ICD-10-CM

## 2022-10-26 DIAGNOSIS — I4819 Other persistent atrial fibrillation: Secondary | ICD-10-CM | POA: Diagnosis not present

## 2022-10-26 DIAGNOSIS — R200 Anesthesia of skin: Secondary | ICD-10-CM

## 2022-10-26 DIAGNOSIS — I10 Essential (primary) hypertension: Secondary | ICD-10-CM | POA: Diagnosis not present

## 2022-10-26 NOTE — Assessment & Plan Note (Signed)
reported the patient c/o numbness of R+L hands for the past 2 weeks, has been using fingerless gloves which hepled.  Upon my visit today: the patient stated it's not new, no change of the nature, still able to play piano or other ADLs, declined workup, neurology referral, or medication

## 2022-10-26 NOTE — Assessment & Plan Note (Signed)
tolerated Namenda well, helped with her mood as well. MMSE 26/30 03/23/22,  TSH 4.35 06/25/22 

## 2022-10-26 NOTE — Progress Notes (Signed)
Location:  [facility] Room Number: AL/824/A Place of Service:  ALF (13) Provider:  Kemoni Ortega X, NP  [name], MD  Patient Care Team: [name], MD as PCP - General (Family Medicine) [name], MD (Inactive) as PCP - Electrophysiology (Cardiology) [name], MD as PCP - Cardiology (Cardiology) [name], RN as Case Manager Helaine Chess [name], FNP (Hospice and Palliative Medicine)  Extended Emergency Contact Information Primary Emergency Contact: [name] Address: [address], [place] Home Phone: [phone] Mobile Phone: [phone] Relation: Daughter Secondary Emergency Contact: Gae Dry Home Phone: [phone] Mobile Phone: [phone] Relation: Daughter Interpreter needed? No  Code Status:  DNR Goals of care: Advanced Directive information    09/18/2022    1:53 PM  Advanced Directives  Does Patient Have a Medical Advance Directive? Yes  Type of Estate agent of [place];Out of facility DNR (pink MOST or yellow form)  Does patient want to make changes to medical advance directive? No - Patient declined  Copy of Healthcare Power of Attorney in Chart? Yes - validated most recent copy scanned in chart (See row information)  Pre-existing out of facility DNR order (yellow form or pink MOST form) Yellow form placed in chart (order not valid for inpatient use)     Chief Complaint  Patient presents with   Acute Visit    Patient is being seen for hand swelling    HPI:  Pt is a 89 y.o. female seen today for an acute visit for reported the patient c/o numbness of R+L hands for the past 2 weeks, has been using fingerless gloves which hepled.  Upon my visit today: the patient stated it's not new, no change of the nature, still able to play piano or other ADLs, declined workup, neurology referral, or medication   Senile dementia, tolerated Namenda  well, helped with her mood as well. MMSE 26/30 03/23/22,  TSH 4.35 06/25/22             Afib, on Metoprolol, Flecainide, Eliquis. Hgb 14.11/09/09/24             AS moderate             HTN on Metoprolol, Furosemide, intermittently elevated Sbp, f/u Cardiology.              Pacemaker, f/u Cardiology             CHF, edema BLE, on Furosemide. EF 70-75% 09/19/21, 03/27/22 Venous US, negative DVT. 03/30/22 arterial US mild to moderate obstructive disease BLE             CKD Bun/creat 21/0.9 07/05/22             Dysphagia, nectar liquid.              Hypothyroidism, taking Levothyroxine, TSH 4.35 06/25/22             OA, takes Tylenol              CT head 02/12/22 Mild age-related atrophy and chronic microvascular ischemic changes.             Hypokalemia, K 4.0 07/05/22                    Past Medical History:  Diagnosis Date   Aortic stenosis 03/08/2016   Brady-tachy syndrome (HCC)    a. Biotronik dual chamber PPM implanted  2009 b. gen change to STJ dual chamber PPM 2017   Ejection fraction    EF 70%, echo, October, 2009  //   EF 55-60%, septal dyssynergy consistent with a paced rhythm, mild to moderate mitral regurgitation, echo, November, 2015    GERD (gastroesophageal reflux disease) 04/28/2014   Episodes November, 2015 with fluid refluxing from her esophagus.    Hair loss    Patient questioned Coumadin, changed toPradaxa   Hypertension    Hypothyroidism    Lower extremity edema 01/22/2018   Mitral regurgitation 05/03/2014   Mild-to-moderate mitral regurgitation, echo, November, 2015    Osteoarthritis of left knee 10/25/2017   Paroxysmal atrial fibrillation (HCC)    Episodes rapid atrial fib noted by pacemaker interrogation, August, 2011, diltiazem added, patient improved. Patient continues on Rythmol  //   Changed to flecainide 2013  //   flecainide level checked in 2013, good level    Persistent atrial fibrillation (HCC)    Past Surgical History:  Procedure Laterality Date   ABDOMINAL  HYSTERECTOMY     EP IMPLANTABLE DEVICE N/A 08/08/2015   Generator change with [employer] DR PPM by Dr Johney Frame   JOINT REPLACEMENT     PACEMAKER PLACEMENT  2009          Outpatient Encounter Medications as of 10/26/2022  Medication Sig   acetaminophen (TYLENOL) 325 MG tablet Take 2 tablets (650 mg total) by mouth every 6 (six) hours as needed.   acetaminophen (TYLENOL) 500 MG tablet Take 1 tablet (500 mg total) by mouth every 8 (eight) hours as needed. **DAW** Give 1000 mg by mouth two times a day for Pain   apixaban (ELIQUIS) 5 MG TABS tablet Take 1 tablet (5 mg total) by mouth 2 (two) times daily.   Apoaequorin (PREVAGEN) 10 MG CAPS Take 10 mg by mouth daily.   betamethasone, augmented, (DIPROLENE) 0.05 % gel Apply topically daily.  Apply to scalp topically every 12 hours as needed for psoriasis related to PSORIASIS, UNSPECIFIED (L40.9) apply to affected area of scalp   Calcium Carbonate-Vit D-Min (CALCIUM 600+D PLUS MINERALS) 600-400 MG-UNIT TABS Take 1 tablet by mouth daily.   Carboxymethylcell-Glycerin PF (REFRESH RELIEVA PF) 0.5-1 % SOLN Apply 1 drop to eye in the morning and at bedtime. for relieve discomfort   diclofenac Sodium (VOLTAREN) 1 % GEL Apply to left foot bunion deformit topically two times a day for Pain 1 gm   flecainide (TAMBOCOR) 50 MG tablet Take 1 tablet (50 mg total) by mouth 2 (two) times daily.   furosemide (LASIX) 20 MG tablet TAKE ONE TABLET BY MOUTH EVERY DAY   hydrocortisone 2.5 % ointment Apply to affected areas topically as needed for Rashy/itchy areas two times daily-MAY SELF ADMINISTER-per pt request   levothyroxine (SYNTHROID) 100 MCG tablet Take 1 tablet (100 mcg total) by mouth every morning.   memantine (NAMENDA) 10 MG tablet Take 1 tablet (10 mg total) by mouth 2 (two) times daily. for cognitive decline   metoprolol succinate (TOPROL-XL) 25 MG 24 hr tablet Take 3 tablets (75 mg total) by mouth daily.   Multiple Vitamin (MULTIVITAMIN ADULT) TABS Take 1  tablet by mouth daily.   Multiple Vitamins-Minerals (OCUVITE PO) Take 1 capsule by mouth daily.   potassium chloride (KLOR-CON M) 10 MEQ tablet Take 1 tablet (10 mEq total) by mouth daily.   sodium chloride (MURO 128) 5 % ophthalmic ointment Place 1 Application into both eyes at bedtime.  for reduce swelling At least 1 hour after  other drops   sodium chloride (MURO 128) 5 % ophthalmic solution Place 1 drop into both eyes in the morning and at bedtime. for decrease swelling Wait 1 hour between these and Refresh   No facility-administered encounter medications on file as of 10/26/2022.    Review of Systems  Constitutional:  Negative for appetite change, fatigue and fever.  HENT:  Positive for trouble swallowing. Negative for congestion.   Eyes:  Negative for visual disturbance.  Respiratory:  Negative for shortness of breath.   Cardiovascular:  Positive for leg swelling. Negative for chest pain and palpitations.  Gastrointestinal:  Negative for abdominal pain and constipation.  Genitourinary:  Negative for dysuria, frequency and urgency.  Musculoskeletal:  Positive for arthralgias and gait problem.       Knees, L>R, intermittent in nature.   Skin:  Negative for color change.       Chronic venous insufficiency skin changes: mild erythema in mid of R+L lower legs, weak DP pulses.   Neurological:  Positive for numbness. Negative for tremors, speech difficulty, weakness and headaches.       Memory lapses. chronic numbness in fingers.   Psychiatric/Behavioral:  Negative for behavioral problems, confusion and sleep disturbance. The patient is not nervous/anxious.     Immunization History  Administered Date(s) Administered   Fluad Quad(high Dose 65+) 04/02/2022   Influenza Split 03/11/2009, 03/11/2010, 02/25/2011, 02/24/2019, 03/08/2020, 03/25/2021   Influenza, High Dose Seasonal PF 03/08/2015, 04/03/2017, 03/17/2018, 03/08/2020   Influenza,inj,Quad PF,6+ Mos 03/08/2016   Influenza,inj,quad,  With Preservative 03/11/2017   Moderna Covid-19 Vaccine Bivalent Booster 9yrs & up 10/27/2021   Moderna SARS-COV2 Booster Vaccination 11/08/2020   Moderna Sars-Covid-2 Vaccination 06/15/2019, 07/13/2019, 04/19/2020, 02/27/2022   Pfizer Covid-19 Vaccine Bivalent Booster 22yrs & up 02/28/2021, 04/12/2022   Pneumococcal Conjugate-13 12/06/2014   Pneumococcal Polysaccharide-23 05/13/2001, 03/11/2009   Polio, Unspecified 02/02/1958   Tdap 11/21/2011   Pertinent  Health Maintenance Due  Topic Date Due   INFLUENZA VACCINE  01/10/2023   DEXA SCAN  Completed      02/16/2022   12:00 AM 02/16/2022    9:45 AM 02/27/2022   11:41 AM 07/06/2022   10:04 AM 07/13/2022    3:55 PM  Fall Risk  Falls in the past year?    0 0  Was there an injury with Fall?    0 0  Fall Risk Category Calculator    0 0  (RETIRED) Patient Fall Risk Level High fall risk High fall risk High fall risk    Patient at Risk for Falls Due to   History of fall(s) No Fall Risks No Fall Risks  Fall risk Follow up   Falls evaluation completed  Falls evaluation completed   Functional Status Survey:    Vitals:   10/26/22 1435  BP: 124/70  Pulse: 66  Resp: 18  Temp: (!) 97.5 F (36.4 C)  SpO2: 96%  Weight: 205 lb (93 kg)  Height: 5\' 6"  (1.676 m)   Body mass index is 33.09 kg/m. Physical Exam Vitals and nursing note reviewed.  Constitutional:      Appearance: Normal appearance.  HENT:     Head: Normocephalic and atraumatic.     Nose: Nose normal.     Mouth/Throat:     Mouth: Mucous membranes are moist.  Eyes:     Extraocular Movements: Extraocular movements intact.     Conjunctiva/sclera: Conjunctivae normal.     Pupils: Pupils are equal, round, and reactive to light.  Cardiovascular:  Rate and Rhythm: Normal rate. Rhythm irregular.     Heart sounds: Murmur heard.     Comments: No dorsalis pedis pulses felt from previous examination.  Pulmonary:     Effort: Pulmonary effort is normal.     Breath sounds: No  rales.  Abdominal:     General: Bowel sounds are normal.     Palpations: Abdomen is soft.     Tenderness: There is no abdominal tenderness.  Musculoskeletal:        General: Normal range of motion.     Cervical back: Normal range of motion and neck supple. No rigidity or tenderness.     Right lower leg: Edema present.     Left lower leg: Edema present.     Comments: Trace -1+ edema LLE>RLE  Lymphadenopathy:     Cervical: No cervical adenopathy.  Skin:    General: Skin is warm and dry.     Findings: Erythema present.     Comments: Mild dark erythema mid of R+L lower legs, chronic.   Neurological:     General: No focal deficit present.     Mental Status: She is alert and oriented to person, place, and time. Mental status is at baseline.     Cranial Nerves: No cranial nerve deficit.     Motor: No weakness.     Coordination: Coordination normal.     Gait: Gait abnormal.     Comments: 5/5 muscle strength in fingers.   Psychiatric:        Mood and Affect: Mood normal.        Behavior: Behavior normal.     Labs reviewed: Recent Labs    02/14/22 0808 02/15/22 0444 03/09/22 1218 06/28/22 0000 07/05/22 0000  NA 139 141 140 141 140  K 3.9 3.4* 4.3 4.4 4.0  CL 109 109 105 104 104  CO2 26 25 25  30* 28*  GLUCOSE 102* 100* 81  --   --   BUN 19 15 20 20 21   CREATININE 0.81 0.71 1.03* 0.9 0.9  CALCIUM 8.4* 8.3* 9.8 9.5 9.2  MG 2.0 2.2  --   --   --    Recent Labs    02/11/22 2351 06/28/22 0000 07/05/22 0000  AST 35 29 23  ALT 17 23 18   ALKPHOS 44 46 44  BILITOT 1.1  --   --   PROT 6.7  --   --   ALBUMIN 3.5 3.9 3.8   Recent Labs    02/14/22 0415 02/14/22 0808 02/15/22 0444 06/25/22 0000  WBC 9.3 9.4 6.7 4.9  NEUTROABS  --  7.5 4.1 2,279.00  HGB 12.1 12.2 11.9* 14.6  HCT 37.3 36.8 36.4 44  MCV 98.9 97.9 97.6  --   PLT 97* 95* 110* 153   Lab Results  Component Value Date   TSH 4.35 06/25/2022   No results found for: "HGBA1C" Lab Results  Component Value  Date   CHOL 193 05/13/2017   HDL 41 05/13/2017   LDLCALC 122 (H) 05/13/2017   TRIG 152 (H) 05/13/2017   CHOLHDL 4.7 (H) 05/13/2017    Significant Diagnostic Results in last 30 days:  CUP PACEART REMOTE DEVICE CHECK  Result Date: 10/12/2022 Scheduled remote reviewed. Normal device function.  Next remote 91 days. LA, CVRS   Assessment/Plan Numbness of fingers of both hands reported the patient c/o numbness of R+L hands for the past 2 weeks, has been using fingerless gloves which hepled.  Upon my visit today: the patient  stated it's not new, no change of the nature, still able to play piano or other ADLs, declined workup, neurology referral, or medication  Mild cognitive impairment  tolerated Namenda well, helped with her mood as well. MMSE 26/30 03/23/22,  TSH 4.35 06/25/22  Persistent atrial fibrillation (HCC) on Metoprolol, Flecainide, Eliquis. Hgb 14.11/09/09/24  Sinus node dysfunction (HCC) Pacemaker  Hypertension on Metoprolol, Furosemide, intermittently elevated Sbp, f/u Cardiology.   Congestive heart failure (HCC)  edema BLE, on Furosemide. EF 70-75% 09/19/21, 03/27/22 Venous US, negative DVT. 03/30/22 arterial US mild to moderate obstructive disease BLE  Hypothyroidism  taking Levothyroxine, TSH 4.35 06/25/22     Family/ staff Communication: plan of care reviewed with the patient and charge nurse.   Labs/tests ordered: none  Time spend 40 minutes.

## 2022-10-26 NOTE — Assessment & Plan Note (Signed)
on Metoprolol, Flecainide, Eliquis. Hgb 14.11/09/09/24 

## 2022-10-26 NOTE — Assessment & Plan Note (Signed)
taking Levothyroxine, TSH 4.35 06/25/22 

## 2022-10-26 NOTE — Assessment & Plan Note (Signed)
Pacemaker  

## 2022-10-26 NOTE — Assessment & Plan Note (Signed)
edema BLE, on Furosemide. EF 70-75% 09/19/21, 03/27/22 Venous US, negative DVT. 03/30/22 arterial US mild to moderate obstructive disease BLE 

## 2022-10-26 NOTE — Assessment & Plan Note (Signed)
on Metoprolol, Furosemide, intermittently elevated Sbp, f/u Cardiology.  

## 2022-10-31 NOTE — Progress Notes (Signed)
Remote pacemaker transmission.   

## 2022-11-03 DIAGNOSIS — R300 Dysuria: Secondary | ICD-10-CM | POA: Diagnosis not present

## 2022-11-08 DIAGNOSIS — R300 Dysuria: Secondary | ICD-10-CM | POA: Diagnosis not present

## 2022-11-12 ENCOUNTER — Encounter: Payer: Self-pay | Admitting: Nurse Practitioner

## 2022-11-12 ENCOUNTER — Non-Acute Institutional Stay: Payer: Medicare Other | Admitting: Nurse Practitioner

## 2022-11-12 DIAGNOSIS — I4819 Other persistent atrial fibrillation: Secondary | ICD-10-CM | POA: Diagnosis not present

## 2022-11-12 DIAGNOSIS — N390 Urinary tract infection, site not specified: Secondary | ICD-10-CM | POA: Diagnosis not present

## 2022-11-12 DIAGNOSIS — I509 Heart failure, unspecified: Secondary | ICD-10-CM | POA: Diagnosis not present

## 2022-11-12 DIAGNOSIS — I10 Essential (primary) hypertension: Secondary | ICD-10-CM

## 2022-11-12 NOTE — Assessment & Plan Note (Signed)
Heart rate is in control, on Metoprolol, Flecainide, Eliquis. Hgb 14.11/09/09/24 

## 2022-11-12 NOTE — Assessment & Plan Note (Signed)
on Metoprolol, Furosemide, intermittently elevated Sbp, but today also noted lower Dbp, asymptomatic, will obtain Bp daily x3, f/u Cardiology.

## 2022-11-12 NOTE — Progress Notes (Unsigned)
Location:   [facility] Room Number: 824 Place of Service:  ALF (13) Provider: Arna Snipe Aaric Dolph NP  [name], MD  Patient Care Team: [name], MD as PCP - General (Family Medicine) [name], MD (Inactive) as PCP - Electrophysiology (Cardiology) [name], MD as PCP - Cardiology (Cardiology) [name], RN as Case Manager Helaine Chess [name], FNP (Hospice and Palliative Medicine)  Extended Emergency Contact Information Primary Emergency Contact: [name] Address: [address], [place] Home Phone: [phone] Mobile Phone: [phone] Relation: Daughter Secondary Emergency Contact: Gae Dry Home Phone: [phone] Mobile Phone: [phone] Relation: Daughter Interpreter needed? No  Code Status: DNR Goals of care: Advanced Directive information    09/18/2022    1:53 PM  Advanced Directives  Does Patient Have a Medical Advance Directive? Yes  Type of Estate agent of [place];Out of facility DNR (pink MOST or yellow form)  Does patient want to make changes to medical advance directive? No - Patient declined  Copy of Healthcare Power of Attorney in Chart? Yes - validated most recent copy scanned in chart (See row information)  Pre-existing out of facility DNR order (yellow form or pink MOST form) Yellow form placed in chart (order not valid for inpatient use)     Chief Complaint  Patient presents with   Acute Visit    Burning sensation upon urination.     HPI:  Pt is a 89 y.o. female seen today for an acute visit for c/o burning sensation upon urination, lower abd discomfort, denied increased urinary frequency, incontinency, or urgency, she is afebrile.    Chronic numbness of R+L hands, has been using fingerless gloves which helped, declined workup, neurology referral, or medication              Senile dementia, tolerated Namenda well, helped with her mood as  well. MMSE 26/30 03/23/22,  TSH 4.35 06/25/22             Afib, on Metoprolol, Flecainide, Eliquis. Hgb 14.11/09/09/24             AS moderate             HTN on Metoprolol, Furosemide, intermittently elevated Sbp, f/u Cardiology.              Pacemaker, f/u Cardiology             CHF, edema BLE, on Furosemide. EF 70-75% 09/19/21, 03/27/22 Venous US, negative DVT. 03/30/22 arterial US mild to moderate obstructive disease BLE             CKD Bun/creat 21/0.9 07/05/22             Dysphagia, nectar liquid.              Hypothyroidism, taking Levothyroxine, TSH 4.35 06/25/22             OA, takes Tylenol              CT head 02/12/22 Mild age-related atrophy and chronic microvascular ischemic changes.             Hypokalemia, K 4.0 07/05/22     Past Medical History:  Diagnosis Date   Aortic stenosis 03/08/2016   Brady-tachy syndrome (HCC)    a. Biotronik dual chamber PPM implanted 2009 b. gen change to STJ dual chamber PPM 2017   Ejection fraction    EF 70%, echo,  October, 2009  //   EF 55-60%, septal dyssynergy consistent with a paced rhythm, mild to moderate mitral regurgitation, echo, November, 2015    GERD (gastroesophageal reflux disease) 04/28/2014   Episodes November, 2015 with fluid refluxing from her esophagus.    Hair loss    Patient questioned Coumadin, changed toPradaxa   Hypertension    Hypothyroidism    Lower extremity edema 01/22/2018   Mitral regurgitation 05/03/2014   Mild-to-moderate mitral regurgitation, echo, November, 2015    Osteoarthritis of left knee 10/25/2017   Paroxysmal atrial fibrillation (HCC)    Episodes rapid atrial fib noted by pacemaker interrogation, August, 2011, diltiazem added, patient improved. Patient continues on Rythmol  //   Changed to flecainide 2013  //   flecainide level checked in 2013, good level    Persistent atrial fibrillation (HCC)    Past Surgical History:  Procedure Laterality Date   ABDOMINAL HYSTERECTOMY     EP IMPLANTABLE DEVICE N/A  08/08/2015   Generator change with SJM [employer]urity DR PPM by Dr Johney Frame   JOINT REPLACEMENT     PACEMAKER PLACEMENT  2009        Allergies  Allergen Reactions   Morphine And Codeine Other (See Comments)    Patient states that she went "crazy" with this   Penicillins Swelling and Rash    Has patient had a PCN reaction causing immediate rash, facial/tongue/throat swelling, SOB or lightheadedness with hypotension: {Yes/No:30480221} Has patient had a PCN reaction causing severe rash involving mucus membranes or skin necrosis: {Yes/No:30480221} Has patient had a PCN reaction that required hospitalization {Yes/No:30480221} Has patient had a PCN reaction occurring within the last 10 years: {Yes/No:30480221} If all of the above answers are "NO", then may proceed with Cephalosporin use.   Lasix [Furosemide] Itching    Patient stated it made her itch all over and dry.   Aspirin     Pacemaker   Clindamycin/Lincomycin Other (See Comments)    Any meds with mycin in the name Unknown reaction   Crestor [Rosuvastatin]     Other reaction(s): muscle pain   Diltiazem     Skin discoloration, lower extremity swelling   Erythromycin Other (See Comments)    Any meds with mycin in the name Unknown reaction   Lisinopril Other (See Comments)    REACTION: Cough   Metronidazole     Other reaction(s): mouth sores, tongue swelling   Penicillins Cross Reactors Other (See Comments)    Any meds with mycin in the name Unknown reaction    Allergies as of 11/12/2022       Reactions   Morphine And Codeine Other (See Comments)   Patient states that she went "crazy" with this   Penicillins Swelling, Rash   Has patient had a PCN reaction causing immediate rash, facial/tongue/throat swelling, SOB or lightheadedness with hypotension: {Yes/No:30480221} Has patient had a PCN reaction causing severe rash involving mucus membranes or skin necrosis: {Yes/No:30480221} Has patient had a PCN reaction that required  hospitalization {Yes/No:30480221} Has patient had a PCN reaction occurring within the last 10 years: {Yes/No:30480221} If all of the above answers are "NO", then may proceed with Cephalosporin use.   Lasix [furosemide] Itching   Patient stated it made her itch all over and dry.   Aspirin    Pacemaker   Clindamycin/lincomycin Other (See Comments)   Any meds with mycin in the name Unknown reaction   Crestor [rosuvastatin]    Other reaction(s): muscle pain   Diltiazem    Skin discoloration,  lower extremity swelling   Erythromycin Other (See Comments)   Any meds with mycin in the name Unknown reaction   Lisinopril Other (See Comments)   REACTION: Cough   Metronidazole    Other reaction(s): mouth sores, tongue swelling   Penicillins Cross Reactors Other (See Comments)   Any meds with mycin in the name Unknown reaction        Medication List        Accurate as of November 12, 2022 12:33 PM. If you have any questions, ask your nurse or doctor.          acetaminophen 500 MG tablet Commonly known as: TYLENOL Take 1 tablet (500 mg total) by mouth every 8 (eight) hours as needed. **DAW** Give 1000 mg by mouth two times a day for Pain   acetaminophen 325 MG tablet Commonly known as: Tylenol Take 2 tablets (650 mg total) by mouth every 6 (six) hours as needed.   apixaban 5 MG Tabs tablet Commonly known as: Eliquis Take 1 tablet (5 mg total) by mouth 2 (two) times daily.   betamethasone (augmented) 0.05 % gel Commonly known as: DIPROLENE Apply topically daily.  Apply to scalp topically every 12 hours as needed for psoriasis related to PSORIASIS, UNSPECIFIED (L40.9) apply to affected area of scalp   Calcium 600+D Plus Minerals 600-400 MG-UNIT Tabs Take 1 tablet by mouth daily.   flecainide 50 MG tablet Commonly known as: TAMBOCOR Take 1 tablet (50 mg total) by mouth 2 (two) times daily.   furosemide 20 MG tablet Commonly known as: LASIX TAKE ONE TABLET BY MOUTH EVERY DAY    hydrocortisone 2.5 % ointment Apply to affected areas topically as needed for Rashy/itchy areas two times daily-MAY SELF ADMINISTER-per pt request   levothyroxine 100 MCG tablet Commonly known as: SYNTHROID Take 1 tablet (100 mcg total) by mouth every morning.   memantine 10 MG tablet Commonly known as: NAMENDA Take 1 tablet (10 mg total) by mouth 2 (two) times daily. for cognitive decline   metoprolol succinate 25 MG 24 hr tablet Commonly known as: TOPROL-XL Take 3 tablets (75 mg total) by mouth daily.   Multivitamin Adult Tabs Take 1 tablet by mouth daily.   OCUVITE PO Take 1 capsule by mouth daily.   potassium chloride 10 MEQ tablet Commonly known as: KLOR-CON M Take 1 tablet (10 mEq total) by mouth daily.   Prevagen 10 MG Caps Generic drug: Apoaequorin Take 10 mg by mouth daily.   Refresh Relieva PF 0.5-1 % Soln Generic drug: Carboxymethylcell-Glycerin PF Apply 1 drop to eye in the morning and at bedtime. for relieve discomfort   sodium chloride 5 % ophthalmic solution Commonly known as: Muro 128 Place 1 drop into both eyes in the morning and at bedtime. for decrease swelling Wait 1 hour between these and Refresh   sodium chloride 5 % ophthalmic ointment Commonly known as: MURO 128 Place 1 Application into both eyes at bedtime.  for reduce swelling At least 1 hour after other drops   Voltaren 1 % Gel Generic drug: diclofenac Sodium Apply to left foot bunion deformit topically two times a day for Pain 1 gm        Review of Systems  Constitutional:  Negative for appetite change, fatigue and fever.  HENT:  Positive for trouble swallowing. Negative for congestion.   Eyes:  Negative for visual disturbance.  Respiratory:  Negative for shortness of breath.   Cardiovascular:  Positive for leg swelling. Negative for chest pain  and palpitations.  Gastrointestinal:  Negative for constipation.       Suprapubic discomfort.   Genitourinary:  Positive for dysuria.  Negative for frequency and urgency.  Musculoskeletal:  Positive for arthralgias and gait problem.       Knees, L>R, intermittent in nature.   Skin:  Negative for color change.       Chronic venous insufficiency skin changes: mild erythema in mid of R+L lower legs, weak DP pulses.   Neurological:  Positive for numbness. Negative for tremors, speech difficulty, weakness and headaches.       Memory lapses. chronic numbness in fingers.   Psychiatric/Behavioral:  Negative for behavioral problems, confusion and sleep disturbance. The patient is not nervous/anxious.     Immunization History  Administered Date(s) Administered   Fluad Quad(high Dose 65+) 04/02/2022   Influenza Split 03/11/2009, 03/11/2010, 02/25/2011, 02/24/2019, 03/08/2020, 03/25/2021   Influenza, High Dose Seasonal PF 03/08/2015, 04/03/2017, 03/17/2018, 03/08/2020   Influenza,inj,Quad PF,6+ Mos 03/08/2016   Influenza,inj,quad, With Preservative 03/11/2017   Moderna Covid-19 Vaccine Bivalent Booster 25yrs & up 10/27/2021   Moderna SARS-COV2 Booster Vaccination 11/08/2020   Moderna Sars-Covid-2 Vaccination 06/15/2019, 07/13/2019, 04/19/2020, 02/27/2022   Pfizer Covid-19 Vaccine Bivalent Booster 62yrs & up 02/28/2021, 04/12/2022   Pneumococcal Conjugate-13 12/06/2014   Pneumococcal Polysaccharide-23 05/13/2001, 03/11/2009   Polio, Unspecified 02/02/1958   Tdap 11/21/2011   Pertinent  Health Maintenance Due  Topic Date Due   INFLUENZA VACCINE  01/10/2023   DEXA SCAN  Completed      02/16/2022   12:00 AM 02/16/2022    9:45 AM 02/27/2022   11:41 AM 07/06/2022   10:04 AM 07/13/2022    3:55 PM  Fall Risk  Falls in the past year?    0 0  Was there an injury with Fall?    0 0  Fall Risk Category Calculator    0 0  (RETIRED) Patient Fall Risk Level High fall risk High fall risk High fall risk    Patient at Risk for Falls Due to   History of fall(s) No Fall Risks No Fall Risks  Fall risk Follow up   Falls evaluation completed  Falls  evaluation completed   Functional Status Survey:    Vitals:   11/12/22 1216  BP: (!) 149/44  Pulse: 62  Resp: 16  Temp: (!) 96.1 F (35.6 C)  SpO2: 95%  Weight: 205 lb (93 kg)   Body mass index is 33.09 kg/m. Physical Exam Vitals and nursing note reviewed.  Constitutional:      Appearance: Normal appearance.  HENT:     Head: Normocephalic and atraumatic.     Nose: Nose normal.     Mouth/Throat:     Mouth: Mucous membranes are moist.  Eyes:     Extraocular Movements: Extraocular movements intact.     Conjunctiva/sclera: Conjunctivae normal.     Pupils: Pupils are equal, round, and reactive to light.  Cardiovascular:     Rate and Rhythm: Normal rate. Rhythm irregular.     Heart sounds: Murmur heard.     Comments: No dorsalis pedis pulses felt from previous examination.  Pulmonary:     Effort: Pulmonary effort is normal.     Breath sounds: No rales.  Abdominal:     General: Bowel sounds are normal. There is no distension.     Palpations: Abdomen is soft.     Tenderness: There is no guarding or rebound.     Comments: Suprapubic discomfort.   Musculoskeletal:  General: Normal range of motion.     Cervical back: Normal range of motion and neck supple. No rigidity or tenderness.     Right lower leg: Edema present.     Left lower leg: Edema present.     Comments: Trace -1+ edema LLE>RLE  Lymphadenopathy:     Cervical: No cervical adenopathy.  Skin:    General: Skin is warm and dry.     Findings: Erythema present.     Comments: Mild dark erythema mid of R+L lower legs, chronic.   Neurological:     General: No focal deficit present.     Mental Status: She is alert and oriented to person, place, and time. Mental status is at baseline.     Cranial Nerves: No cranial nerve deficit.     Motor: No weakness.     Coordination: Coordination normal.     Gait: Gait abnormal.     Comments: 5/5 muscle strength in fingers.   Psychiatric:        Mood and Affect: Mood  normal.        Behavior: Behavior normal.     Labs reviewed: Recent Labs    02/14/22 0808 02/15/22 0444 03/09/22 1218 06/28/22 0000 07/05/22 0000  NA 139 141 140 141 140  K 3.9 3.4* 4.3 4.4 4.0  CL 109 109 105 104 104  CO2 26 25 25  30* 28*  GLUCOSE 102* 100* 81  --   --   BUN 19 15 20 20 21   CREATININE 0.81 0.71 1.03* 0.9 0.9  CALCIUM 8.4* 8.3* 9.8 9.5 9.2  MG 2.0 2.2  --   --   --    Recent Labs    02/11/22 2351 06/28/22 0000 07/05/22 0000  AST 35 29 23  ALT 17 23 18   ALKPHOS 44 46 44  BILITOT 1.1  --   --   PROT 6.7  --   --   ALBUMIN 3.5 3.9 3.8   Recent Labs    02/14/22 0415 02/14/22 0808 02/15/22 0444 06/25/22 0000  WBC 9.3 9.4 6.7 4.9  NEUTROABS  --  7.5 4.1 2,279.00  HGB 12.1 12.2 11.9* 14.6  HCT 37.3 36.8 36.4 44  MCV 98.9 97.9 97.6  --   PLT 97* 95* 110* 153   Lab Results  Component Value Date   TSH 4.35 06/25/2022   No results found for: "HGBA1C" Lab Results  Component Value Date   CHOL 193 05/13/2017   HDL 41 05/13/2017   LDLCALC 122 (H) 05/13/2017   TRIG 152 (H) 05/13/2017   CHOLHDL 4.7 (H) 05/13/2017    Significant Diagnostic Results in last 30 days:  No results found.  Assessment/Plan: UTI (urinary tract infection) c/o burning sensation upon urination, lower abd discomfort, denied increased urinary frequency, incontinency, or urgency, she is afebrile.  Urine culture E.Coli 10,000-49,000c/ml, will treat with Nitrofurantoin 100mg  bid x 5 days in setting of symptomatic.   Persistent atrial fibrillation (HCC) Heart rate is in control, on Metoprolol, Flecainide, Eliquis. Hgb 14.11/09/09/24  Hypertension on Metoprolol, Furosemide, intermittently elevated Sbp, but today also noted lower Dbp, asymptomatic, will obtain Bp daily x3, f/u Cardiology.   Congestive heart failure (HCC) edema BLE, on Furosemide. EF 70-75% 09/19/21, 03/27/22 Venous US, negative DVT. 03/30/22 arterial US mild to moderate obstructive disease BLE    Family/  staff Communication: plan of care reviewed with the patient and charge nurse   Labs/tests ordered:  UA C/S done 11/08/22  Time spend 40 minutes

## 2022-11-12 NOTE — Assessment & Plan Note (Signed)
c/o burning sensation upon urination, lower abd discomfort, denied increased urinary frequency, incontinency, or urgency, she is afebrile.  Urine culture E.Coli 10,000-49,000c/ml, will treat with Nitrofurantoin 100mg  bid x 5 days in setting of symptomatic.

## 2022-11-12 NOTE — Assessment & Plan Note (Signed)
edema BLE, on Furosemide. EF 70-75% 09/19/21, 03/27/22 Venous US, negative DVT. 03/30/22 arterial US mild to moderate obstructive disease BLE 

## 2022-11-13 ENCOUNTER — Other Ambulatory Visit: Payer: Self-pay | Admitting: Family Medicine

## 2022-11-13 DIAGNOSIS — I480 Paroxysmal atrial fibrillation: Secondary | ICD-10-CM

## 2022-11-13 MED ORDER — CALCIUM 600+D PLUS MINERALS 600-400 MG-UNIT PO TABS: 1.0000 | ORAL_TABLET | Freq: Every day | ORAL | 5 refills | Status: AC

## 2022-11-13 MED ORDER — FUROSEMIDE 20 MG PO TABS: 20.0000 mg | ORAL_TABLET | Freq: Every day | ORAL | 1 refills | Status: DC

## 2022-11-13 MED ORDER — REFRESH RELIEVA PF 0.5-1 % OP SOLN: 1.0000 [drp] | Freq: Two times a day (BID) | OPHTHALMIC | 2 refills | Status: DC

## 2022-11-13 MED ORDER — DICLOFENAC SODIUM 1 % EX GEL: 2.0000 g | Freq: Four times a day (QID) | CUTANEOUS | 2 refills | Status: DC

## 2022-11-13 MED ORDER — PREVAGEN 10 MG PO CAPS: 10.0000 mg | ORAL_CAPSULE | Freq: Every day | ORAL | 5 refills | Status: AC

## 2022-11-13 MED ORDER — APIXABAN 5 MG PO TABS: 5.0000 mg | ORAL_TABLET | Freq: Two times a day (BID) | ORAL | 1 refills | Status: DC

## 2022-11-13 MED ORDER — BETAMETHASONE DIPROPIONATE AUG 0.05 % EX GEL: Freq: Every day | CUTANEOUS | 2 refills | Status: DC

## 2022-11-13 MED ORDER — MEMANTINE HCL 10 MG PO TABS: 10.0000 mg | ORAL_TABLET | Freq: Two times a day (BID) | ORAL | 5 refills | Status: DC

## 2022-11-13 MED ORDER — LEVOTHYROXINE SODIUM 100 MCG PO TABS: 100.0000 ug | ORAL_TABLET | Freq: Every morning | ORAL | 3 refills | Status: DC

## 2022-11-13 MED ORDER — FLECAINIDE ACETATE 50 MG PO TABS: 50.0000 mg | ORAL_TABLET | Freq: Two times a day (BID) | ORAL | 3 refills | Status: DC

## 2022-11-13 MED ORDER — SODIUM CHLORIDE (HYPERTONIC) 5 % OP OINT: 1.0000 | TOPICAL_OINTMENT | Freq: Every day | OPHTHALMIC | 2 refills | Status: DC

## 2022-11-13 MED ORDER — METOPROLOL SUCCINATE ER 25 MG PO TB24: 75.0000 mg | ORAL_TABLET | Freq: Every day | ORAL | 3 refills | Status: DC

## 2022-11-19 ENCOUNTER — Other Ambulatory Visit: Payer: Self-pay | Admitting: Family Medicine

## 2022-11-19 NOTE — Telephone Encounter (Signed)
High Risk Warning Populated when attempting to refill, I will send to Provider for further review 

## 2022-11-28 ENCOUNTER — Non-Acute Institutional Stay: Payer: Medicare Other | Admitting: Nurse Practitioner

## 2022-11-28 DIAGNOSIS — L03116 Cellulitis of left lower limb: Secondary | ICD-10-CM | POA: Diagnosis not present

## 2022-11-28 DIAGNOSIS — R1319 Other dysphagia: Secondary | ICD-10-CM

## 2022-11-28 DIAGNOSIS — I480 Paroxysmal atrial fibrillation: Secondary | ICD-10-CM

## 2022-11-28 DIAGNOSIS — I509 Heart failure, unspecified: Secondary | ICD-10-CM

## 2022-11-28 DIAGNOSIS — G3184 Mild cognitive impairment, so stated: Secondary | ICD-10-CM | POA: Diagnosis not present

## 2022-11-28 DIAGNOSIS — I10 Essential (primary) hypertension: Secondary | ICD-10-CM

## 2022-11-28 DIAGNOSIS — R200 Anesthesia of skin: Secondary | ICD-10-CM | POA: Diagnosis not present

## 2022-11-28 DIAGNOSIS — E039 Hypothyroidism, unspecified: Secondary | ICD-10-CM | POA: Diagnosis not present

## 2022-11-28 DIAGNOSIS — N1831 Chronic kidney disease, stage 3a: Secondary | ICD-10-CM | POA: Diagnosis not present

## 2022-11-28 DIAGNOSIS — Z950 Presence of cardiac pacemaker: Secondary | ICD-10-CM

## 2022-11-28 NOTE — Progress Notes (Unsigned)
. Location:   [facility] Room Number: 824 Place of Service:  [facility] (13) Provider: Arna Snipe Mackinley Kiehn NP  [name], MD  Patient Care Team: [name], MD as PCP - General (Family Medicine) [name], MD (Inactive) as PCP - Electrophysiology (Cardiology) [name], MD as PCP - Cardiology (Cardiology) [name], RN as Case Manager Helaine Chess [name], FNP (Hospice and Palliative Medicine)  Extended Emergency Contact Information Primary Emergency Contact: [name] Address: [address], [place] Home Phone: [phone] Mobile Phone: [phone] Relation: Daughter Secondary Emergency Contact: Gae Dry Home Phone: [phone] Mobile Phone: [phone] Relation: Daughter Interpreter needed? No  Code Status: DNR Goals of care: Advanced Directive information    09/18/2022    1:53 PM  Advanced Directives  Does Patient Have a Medical Advance Directive? Yes  Type of Estate agent of [place];Out of facility DNR (pink MOST or yellow form)  Does patient want to make changes to medical advance directive? No - Patient declined  Copy of Healthcare Power of Attorney in Chart? Yes - validated most recent copy scanned in chart (See row information)  Pre-existing out of facility DNR order (yellow form or pink MOST form) Yellow form placed in chart (order not valid for inpatient use)     Chief Complaint  Patient presents with   Acute Visit    Left lower leg open wound, redness, tenderness, warmth, and swelling.     HPI:  Pt is a 89 y.o. female seen today for an acute visit for the left anterior mid shin open wound from scratch 11/07/22, mid LLE redness, swelling, pain, and warmth.     Chronic numbness of R+L hands, has been using fingerless gloves which helped, declined workup, neurology referral, or medication              Senile dementia, tolerated Namenda well, helped with her mood  as well. MMSE 26/30 03/23/22,  TSH 4.35 06/25/22             Afib, on Metoprolol, Flecainide, Eliquis. Hgb 14.11/09/09/24             AS moderate             HTN on Metoprolol, Furosemide, intermittently elevated Sbp, f/u Cardiology.              Pacemaker, f/u Cardiology             CHF, edema BLE, on Furosemide. EF 70-75% 09/19/21, 03/27/22 Venous US, negative DVT. 03/30/22 arterial US mild to moderate obstructive disease BLE             CKD Bun/creat 21/0.9 07/05/22             Dysphagia, nectar liquid.              Hypothyroidism, taking Levothyroxine, TSH 4.35 06/25/22             OA, takes Tylenol              CT head 02/12/22 Mild age-related atrophy and chronic microvascular ischemic changes.             Hypokalemia, K 4.0 07/05/22               Past Medical History:  Diagnosis Date   Aortic stenosis 03/08/2016   Brady-tachy syndrome (HCC)    a. Biotronik dual chamber PPM implanted 2009 b. gen  change to STJ dual chamber PPM 2017   Ejection fraction    EF 70%, echo, October, 2009  //   EF 55-60%, septal dyssynergy consistent with a paced rhythm, mild to moderate mitral regurgitation, echo, November, 2015    GERD (gastroesophageal reflux disease) 04/28/2014   Episodes November, 2015 with fluid refluxing from her esophagus.    Hair loss    Patient questioned Coumadin, changed toPradaxa   Hypertension    Hypothyroidism    Lower extremity edema 01/22/2018   Mitral regurgitation 05/03/2014   Mild-to-moderate mitral regurgitation, echo, November, 2015    Osteoarthritis of left knee 10/25/2017   Paroxysmal atrial fibrillation (HCC)    Episodes rapid atrial fib noted by pacemaker interrogation, August, 2011, diltiazem added, patient improved. Patient continues on Rythmol  //   Changed to flecainide 2013  //   flecainide level checked in 2013, good level    Persistent atrial fibrillation (HCC)    Past Surgical History:  Procedure Laterality Date   ABDOMINAL HYSTERECTOMY     EP IMPLANTABLE  DEVICE N/A 08/08/2015   Generator change with SJM Assurity DR PPM by Dr Johney Frame   JOINT REPLACEMENT     PACEMAKER PLACEMENT  2009             Review of Systems  Constitutional:  Negative for appetite change, fatigue and fever.  HENT:  Positive for trouble swallowing. Negative for congestion.   Eyes:  Negative for visual disturbance.  Respiratory:  Negative for shortness of breath.   Cardiovascular:  Positive for leg swelling. Negative for chest pain and palpitations.  Gastrointestinal:  Negative for constipation.       Suprapubic discomfort.   Genitourinary:  Positive for dysuria. Negative for frequency and urgency.  Musculoskeletal:  Positive for arthralgias and gait problem.       Knees, L>R, intermittent in nature.   Skin:  Positive for wound. Negative for color change.       Chronic venous insufficiency skin changes: mild erythema in mid of R+L lower legs, weak DP pulses.   Neurological:  Positive for numbness. Negative for tremors, speech difficulty, weakness and headaches.       Memory lapses. chronic numbness in fingers.   Psychiatric/Behavioral:  Negative for behavioral problems, confusion and sleep disturbance. The patient is not nervous/anxious.     Immunization History  Administered Date(s) Administered   Fluad Quad(high Dose 65+) 04/02/2022   Influenza Split 03/11/2009, 03/11/2010, 02/25/2011, 02/24/2019, 03/08/2020, 03/25/2021   Influenza, High Dose Seasonal PF 03/08/2015, 04/03/2017, 03/17/2018, 03/08/2020   Influenza,inj,Quad PF,6+ Mos 03/08/2016   Influenza,inj,quad, With Preservative 03/11/2017   Moderna Covid-19 Vaccine Bivalent Booster 39yrs & up 10/27/2021   Moderna SARS-COV2 Booster Vaccination 11/08/2020   Moderna Sars-Covid-2 Vaccination 06/15/2019, 07/13/2019, 04/19/2020, 02/27/2022   Pfizer Covid-19 Vaccine Bivalent Booster 84yrs & up 02/28/2021, 04/12/2022   Pneumococcal Conjugate-13 12/06/2014   Pneumococcal Polysaccharide-23 05/13/2001, 03/11/2009    Polio, Unspecified 02/02/1958   Tdap 11/21/2011   Pertinent  Health Maintenance Due  Topic Date Due   INFLUENZA VACCINE  01/10/2023   DEXA SCAN  Completed      02/16/2022   12:00 AM 02/16/2022    9:45 AM 02/27/2022   11:41 AM 07/06/2022   10:04 AM 07/13/2022    3:55 PM  Fall Risk  Falls in the past year?    0 0  Was there an injury with Fall?    0 0  Fall Risk Category Calculator    0 0  (RETIRED) Patient  Fall Risk Level High fall risk High fall risk High fall risk    Patient at Risk for Falls Due to   History of fall(s) No Fall Risks No Fall Risks  Fall risk Follow up   Falls evaluation completed  Falls evaluation completed   Functional Status Survey:    Vitals:   11/28/22 1102  BP: (!) 135/52  Pulse: 62  Resp: 17  Temp: (!) 97.3 F (36.3 C)  SpO2: 93%  Weight: 205 lb 12.8 oz (93.4 kg)   Body mass index is 33.22 kg/m. Physical Exam Vitals and nursing note reviewed.  Constitutional:      Appearance: Normal appearance.  HENT:     Head: Normocephalic and atraumatic.     Nose: Nose normal.     Mouth/Throat:     Mouth: Mucous membranes are moist.  Eyes:     Extraocular Movements: Extraocular movements intact.     Conjunctiva/sclera: Conjunctivae normal.     Pupils: Pupils are equal, round, and reactive to light.  Cardiovascular:     Rate and Rhythm: Normal rate. Rhythm irregular.     Heart sounds: Murmur heard.     Comments: No dorsalis pedis pulses felt from previous examination.  Pulmonary:     Effort: Pulmonary effort is normal.     Breath sounds: No rales.  Abdominal:     General: Bowel sounds are normal. There is no distension.     Palpations: Abdomen is soft.     Tenderness: There is no guarding or rebound.     Comments: Suprapubic discomfort.   Musculoskeletal:        General: Normal range of motion.     Cervical back: Normal range of motion and neck supple. No rigidity or tenderness.     Right lower leg: Edema present.     Left lower leg: Edema  present.     Comments: Trace -1+ edema LLE>RLE  Lymphadenopathy:     Cervical: No cervical adenopathy.  Skin:    General: Skin is warm and dry.     Findings: Erythema present.     Comments: Mild dark erythema mid of R+L lower legs, chronic.  the left anterior mid shin open wound from scratch 11/07/22, mid LLE redness, swelling, pain, and warmth.    Neurological:     General: No focal deficit present.     Mental Status: She is alert and oriented to person, place, and time. Mental status is at baseline.     Cranial Nerves: No cranial nerve deficit.     Motor: No weakness.     Coordination: Coordination normal.     Gait: Gait abnormal.     Comments: 5/5 muscle strength in fingers.   Psychiatric:        Mood and Affect: Mood normal.        Behavior: Behavior normal.     Labs reviewed: Recent Labs    02/14/22 0808 02/15/22 0444 03/09/22 1218 06/28/22 0000 07/05/22 0000  NA 139 141 140 141 140  K 3.9 3.4* 4.3 4.4 4.0  CL 109 109 105 104 104  CO2 26 25 25  30* 28*  GLUCOSE 102* 100* 81  --   --   BUN 19 15 20 20 21   CREATININE 0.81 0.71 1.03* 0.9 0.9  CALCIUM 8.4* 8.3* 9.8 9.5 9.2  MG 2.0 2.2  --   --   --    Recent Labs    02/11/22 2351 06/28/22 0000 07/05/22 0000  AST 35 29  23  ALT 17 23 18   ALKPHOS 44 46 44  BILITOT 1.1  --   --   PROT 6.7  --   --   ALBUMIN 3.5 3.9 3.8   Recent Labs    02/14/22 0415 02/14/22 0808 02/15/22 0444 06/25/22 0000  WBC 9.3 9.4 6.7 4.9  NEUTROABS  --  7.5 4.1 2,279.00  HGB 12.1 12.2 11.9* 14.6  HCT 37.3 36.8 36.4 44  MCV 98.9 97.9 97.6  --   PLT 97* 95* 110* 153   Lab Results  Component Value Date   TSH 4.35 06/25/2022   No results found for: "HGBA1C" Lab Results  Component Value Date   CHOL 193 05/13/2017   HDL 41 05/13/2017   LDLCALC 122 (H) 05/13/2017   TRIG 152 (H) 05/13/2017   CHOLHDL 4.7 (H) 05/13/2017    Significant Diagnostic Results in last 30 days:  No results found.  Assessment/Plan: Cellulitis of left  anterior lower leg   the left anterior mid shin open wound from scratch 11/07/22, mid LLE redness, swelling, pain, and warmth.   Will try Xeroform daily to the open wound, start Doxycycline 100mg  bid x 7 days.     Numbness of fingers of both hands Chronic numbness of R+L hands, has been using fingerless gloves which helped, declined workup, neurology referral, or medication  Mild cognitive impairment Senile dementia, tolerated Namenda well, helped with her mood as well. MMSE 26/30 03/23/22,  TSH 4.35 06/25/22  Paroxysmal atrial fibrillation (HCC) Heart rate is in control, on Metoprolol, Flecainide, Eliquis. Hgb 14.11/09/09/24  Hypertension Blood pressure is controlled, continue Metoprolol, Furosemide, intermittently elevated Sbp, f/u Cardiology.   Pacemaker f/u Cardiology  Congestive heart failure (HCC) edema BLE, on Furosemide. EF 70-75% 09/19/21, 03/27/22 Venous US, negative DVT. 03/30/22 arterial US mild to moderate obstructive disease BLE  CKD (chronic kidney disease) stage 3, GFR 30-59 ml/min (HCC) Bun/creat 21/0.9 07/05/22  Esophageal dysphagia nectar liquid.   Hypothyroidism  taking Levothyroxine, TSH 4.35 06/25/22  Osteoarthritis, multiple sites takes Tylenol     Family/ staff Communication: plan of care reviewed with the patient and charge nurse.   Labs/tests ordered:  none  Time spend 40 minutes

## 2022-11-28 NOTE — Assessment & Plan Note (Signed)
Heart rate is in control, on Metoprolol, Flecainide, Eliquis. Hgb 14.11/09/09/24 

## 2022-11-28 NOTE — Assessment & Plan Note (Signed)
takes Tylenol    

## 2022-11-28 NOTE — Assessment & Plan Note (Signed)
taking Levothyroxine, TSH 4.35 06/25/22 

## 2022-11-28 NOTE — Assessment & Plan Note (Signed)
Blood pressure is controlled, continue Metoprolol, Furosemide, intermittently elevated Sbp, f/u Cardiology.

## 2022-11-28 NOTE — Assessment & Plan Note (Signed)
nectar liquid.  

## 2022-11-28 NOTE — Assessment & Plan Note (Signed)
the left anterior mid shin open wound from scratch 11/07/22, mid LLE redness, swelling, pain, and warmth.   Will try Xeroform daily to the open wound, start Doxycycline 100mg  bid x 7 days.

## 2022-11-28 NOTE — Assessment & Plan Note (Signed)
Bun/creat 21/0.9 07/05/22 

## 2022-11-28 NOTE — Assessment & Plan Note (Signed)
edema BLE, on Furosemide. EF 70-75% 09/19/21, 03/27/22 Venous US, negative DVT. 03/30/22 arterial US mild to moderate obstructive disease BLE 

## 2022-11-28 NOTE — Assessment & Plan Note (Signed)
f/u Cardiology 

## 2022-11-28 NOTE — Assessment & Plan Note (Signed)
Senile dementia, tolerated Namenda well, helped with her mood as well. MMSE 26/30 03/23/22,  TSH 4.35 06/25/22

## 2022-11-28 NOTE — Assessment & Plan Note (Signed)
Chronic numbness of R+L hands, has been using fingerless gloves which helped, declined workup, neurology referral, or medication

## 2022-11-29 ENCOUNTER — Encounter: Payer: Self-pay | Admitting: Nurse Practitioner

## 2023-01-01 ENCOUNTER — Encounter: Payer: Self-pay | Admitting: Nurse Practitioner

## 2023-01-01 ENCOUNTER — Non-Acute Institutional Stay: Payer: Medicare Other | Admitting: Nurse Practitioner

## 2023-01-01 DIAGNOSIS — R1319 Other dysphagia: Secondary | ICD-10-CM

## 2023-01-01 DIAGNOSIS — I10 Essential (primary) hypertension: Secondary | ICD-10-CM

## 2023-01-01 DIAGNOSIS — Z950 Presence of cardiac pacemaker: Secondary | ICD-10-CM

## 2023-01-01 DIAGNOSIS — I480 Paroxysmal atrial fibrillation: Secondary | ICD-10-CM

## 2023-01-01 DIAGNOSIS — N1831 Chronic kidney disease, stage 3a: Secondary | ICD-10-CM

## 2023-01-01 DIAGNOSIS — E039 Hypothyroidism, unspecified: Secondary | ICD-10-CM

## 2023-01-01 DIAGNOSIS — I509 Heart failure, unspecified: Secondary | ICD-10-CM

## 2023-01-01 DIAGNOSIS — F0390 Unspecified dementia without behavioral disturbance: Secondary | ICD-10-CM

## 2023-01-01 NOTE — Progress Notes (Unsigned)
Location:   [facility] Room Number: 824 A Place of Service:  ALF (13) Provider:  Mccormick [name], NP  [name] Kuster, MD  Patient Care Team: [name] Kuster, MD as PCP - General (Family Medicine) [name], MD (Inactive) as PCP - Electrophysiology (Cardiology) [name], MD as PCP - Cardiology (Cardiology) [name], RN as Case Manager [name] [name], FNP (Hospice and Palliative Medicine)  Extended Emergency Contact Information Primary Emergency Contact: [name] Address: [address], [place] Home Phone: [phone] Mobile Phone: [phone] Relation: Daughter Secondary Emergency Contact: [name] Home Phone: [phone] Mobile Phone: [phone] Relation: Daughter Interpreter needed? No  Code Status:  DNR Goals of care: Advanced Directive information    01/01/2023   11:29 AM  Advanced Directives  Does Patient Have a Medical Advance Directive? Yes  Type of Estate agent of [place];Out of facility DNR (pink MOST or yellow form)  Does patient want to make changes to medical advance directive? No - Patient declined  Copy of Healthcare Power of Attorney in Chart? Yes - validated most recent copy scanned in chart (See row information)  Pre-existing out of facility DNR order (yellow form or pink MOST form) Yellow form placed in chart (order not valid for inpatient use)     Chief Complaint  Patient presents with  . Medical Management of Chronic Issues    Routine follow up  . Immunizations    Tdap vaccine, shingles vaccine and COVID booster due    HPI:  Pt is a 89 y.o. female seen today for medical management of chronic diseases.       Chronic numbness of R+L hands, has been using fingerless gloves which helped, declined workup, neurology referral, or medication              Senile dementia, tolerated Namenda well, helped with her mood as well.  MMSE 26/30 03/23/22,  TSH 4.35 06/25/22             Afib, on Metoprolol, Flecainide, Eliquis. Hgb 14.11/09/09/24             AS moderate             HTN on Metoprolol, Furosemide, intermittently elevated Sbp, f/u Cardiology.              Pacemaker, f/u Cardiology             CHF, edema BLE, on Furosemide. EF 70-75% 09/19/21, 03/27/22 Venous US, negative DVT. 03/30/22 arterial US mild to moderate obstructive disease BLE             CKD Bun/creat 21/0.9 07/05/22             Dysphagia, nectar liquid.              Hypothyroidism, taking Levothyroxine, TSH 4.35 06/25/22             OA, takes Tylenol, ambulates with walker.              CT head 02/12/22 Mild age-related atrophy and chronic microvascular ischemic changes.             Hypokalemia, K 4.0 07/05/22                 Past Medical History:  Diagnosis Date  . Aortic stenosis 03/08/2016  . Brady-tachy syndrome (HCC)    a. Biotronik dual chamber PPM implanted  2009 b. gen change to STJ dual chamber PPM 2017  . Ejection fraction    EF 70%, echo, October, 2009  //   EF 55-60%, septal dyssynergy consistent with a paced rhythm, mild to moderate mitral regurgitation, echo, November, 2015   . GERD (gastroesophageal reflux disease) 04/28/2014   Episodes November, 2015 with fluid refluxing from her esophagus.   . Hair loss    Patient questioned Coumadin, changed toPradaxa  . Hypertension   . Hypothyroidism   . Lower extremity edema 01/22/2018  . Mitral regurgitation 05/03/2014   Mild-to-moderate mitral regurgitation, echo, November, 2015   . Osteoarthritis of left knee 10/25/2017  . Paroxysmal atrial fibrillation (HCC)    Episodes rapid atrial fib noted by pacemaker interrogation, August, 2011, diltiazem added, patient improved. Patient continues on Rythmol  //   Changed to flecainide 2013  //   flecainide level checked in 2013, good level   . Persistent atrial fibrillation [facility])    Past Surgical History:  Procedure Laterality Date  . ABDOMINAL  HYSTERECTOMY    . EP IMPLANTABLE DEVICE N/A 08/08/2015   Generator change with SJM Assurity DR PPM by Dr Johney Frame  . JOINT REPLACEMENT    . PACEMAKER PLACEMENT  2009        Allergies  Allergen Reactions  . Morphine And Codeine Other (See Comments)    Patient states that she went "crazy" with this  . Penicillins Swelling and Rash    Has patient had a PCN reaction causing immediate rash, facial/tongue/throat swelling, SOB or lightheadedness with hypotension: {Yes/No:30480221} Has patient had a PCN reaction causing severe rash involving mucus membranes or skin necrosis: {Yes/No:30480221} Has patient had a PCN reaction that required hospitalization {Yes/No:30480221} Has patient had a PCN reaction occurring within the last 10 years: {Yes/No:30480221} If all of the above answers are "NO", then may proceed with Cephalosporin use.  . Lasix [Furosemide] Itching    Patient stated it made her itch all over and [name].  . Aspirin     Pacemaker  . Clindamycin/Lincomycin Other (See Comments)    Any meds with mycin in the name Unknown reaction  . Crestor [Rosuvastatin]     Other reaction(s): muscle pain  . Diltiazem     Skin discoloration, lower extremity swelling  . Erythromycin Other (See Comments)    Any meds with mycin in the name Unknown reaction  . Lisinopril Other (See Comments)    REACTION: Cough  . Metronidazole     Other reaction(s): mouth sores, tongue swelling  . Penicillins Cross Reactors Other (See Comments)    Any meds with mycin in the name Unknown reaction    Allergies as of 01/01/2023       Reactions   Morphine And Codeine Other (See Comments)   Patient states that she went "crazy" with this   Penicillins Swelling, Rash   Has patient had a PCN reaction causing immediate rash, facial/tongue/throat swelling, SOB or lightheadedness with hypotension: {Yes/No:30480221} Has patient had a PCN reaction causing severe rash involving mucus membranes or skin necrosis:  {Yes/No:30480221} Has patient had a PCN reaction that required hospitalization {Yes/No:30480221} Has patient had a PCN reaction occurring within the last 10 years: {Yes/No:30480221} If all of the above answers are "NO", then may proceed with Cephalosporin use.   Lasix [furosemide] Itching   Patient stated it made her itch all over and [name].   Aspirin    Pacemaker   Clindamycin/lincomycin Other (See Comments)   Any meds with mycin in the name Unknown  reaction   Crestor [rosuvastatin]    Other reaction(s): muscle pain   Diltiazem    Skin discoloration, lower extremity swelling   Erythromycin Other (See Comments)   Any meds with mycin in the name Unknown reaction   Lisinopril Other (See Comments)   REACTION: Cough   Metronidazole    Other reaction(s): mouth sores, tongue swelling   Penicillins Cross Reactors Other (See Comments)   Any meds with mycin in the name Unknown reaction        Medication List        Accurate as of January 01, 2023  3:48 PM. If you have any questions, ask your nurse or doctor.          acetaminophen 500 MG tablet Commonly known as: TYLENOL Take 1,000 mg by mouth in the morning and at bedtime. What changed: Another medication with the same name was removed. Continue taking this medication, and follow the directions you see here. Changed by: Jolin Benavides X Evelina Lore   acetaminophen 325 MG tablet Commonly known as: Tylenol Take 2 tablets (650 mg total) by mouth every 6 (six) hours as needed. What changed: Another medication with the same name was removed. Continue taking this medication, and follow the directions you see here. Changed by: Shellia Hartl X Maydell Knoebel   apixaban 5 MG Tabs tablet Commonly known as: Eliquis Take 1 tablet (5 mg total) by mouth 2 (two) times daily.   betamethasone (augmented) 0.05 % gel Commonly known as: DIPROLENE Apply topically daily.  Apply to scalp topically every 12 hours as needed for psoriasis related to PSORIASIS, UNSPECIFIED (L40.9) apply  to affected area of scalp   Calcium 600+D Plus Minerals 600-400 MG-UNIT Tabs Take 1 tablet by mouth daily.   diclofenac Sodium 1 % Gel Commonly known as: VOLTAREN Apply 2 g topically 4 (four) times daily.   flecainide 50 MG tablet Commonly known as: TAMBOCOR Take 1 tablet (50 mg total) by mouth 2 (two) times daily.   furosemide 20 MG tablet Commonly known as: LASIX Take 1 tablet (20 mg total) by mouth daily.   hydrocortisone 2.5 % ointment Apply to affected areas topically as needed for Rashy/itchy areas two times daily-MAY SELF ADMINISTER-per pt request   levothyroxine 100 MCG tablet Commonly known as: SYNTHROID Take 1 tablet (100 mcg total) by mouth every morning.   memantine 10 MG tablet Commonly known as: NAMENDA Take 1 tablet (10 mg total) by mouth 2 (two) times daily. for cognitive decline   metoprolol succinate 25 MG 24 hr tablet Commonly known as: TOPROL-XL Take 3 tablets (75 mg total) by mouth daily.   Multivitamin Adult Tabs Take 1 tablet by mouth daily.   OCUVITE PO Take 1 capsule by mouth daily.   potassium chloride 10 MEQ tablet Commonly known as: KLOR-CON M Take 1 tablet (10 mEq total) by mouth daily.   Prevagen 10 MG Caps Generic drug: Apoaequorin Take 10 mg by mouth daily.   Refresh Relieva PF 0.5-1 % Soln Generic drug: Carboxymethylcell-Glycerin PF Apply 1 drop to eye in the morning and at bedtime. for relieve discomfort   sodium chloride 5 % ophthalmic solution Commonly known as: Muro 128 Place 1 drop into both eyes in the morning and at bedtime. for decrease swelling Wait 1 hour between these and Refresh What changed: Another medication with the same name was removed. Continue taking this medication, and follow the directions you see here. Changed by: Aminah Zabawa X Chauntay Paszkiewicz        Review of Systems  Constitutional:  Negative for appetite change, fatigue and fever.  HENT:  Positive for trouble swallowing. Negative for congestion.   Eyes:  Negative  for visual disturbance.  Respiratory:  Negative for shortness of breath.   Cardiovascular:  Positive for leg swelling. Negative for chest pain and palpitations.  Gastrointestinal:  Negative for constipation.       Suprapubic discomfort.   Genitourinary:  Positive for dysuria. Negative for frequency and urgency.  Musculoskeletal:  Positive for arthralgias and gait problem.       Knees, L>R, intermittent in nature.   Skin:  Positive for wound. Negative for color change.       Chronic venous insufficiency skin changes: mild erythema in mid of R+L   Neurological:  Positive for numbness. Negative for tremors, speech difficulty, weakness and headaches.       Memory lapses. chronic numbness in fingers.   Psychiatric/Behavioral:  Negative for behavioral problems, confusion and sleep disturbance. The patient is not nervous/anxious.     Immunization History  Administered Date(s) Administered  . Fluad Quad(high Dose 65+) 04/02/2022  . Influenza Split 03/11/2009, 03/11/2010, 02/25/2011, 02/24/2019, 03/08/2020, 03/25/2021  . Influenza, High Dose Seasonal PF 03/08/2015, 04/03/2017, 03/17/2018, 03/08/2020  . Influenza,inj,Quad PF,6+ Mos 03/08/2016  . Influenza,inj,quad, With Preservative 03/11/2017  . Moderna Covid-19 Vaccine Bivalent Booster 51yrs & up 10/27/2021  . Moderna SARS-COV2 Booster Vaccination 11/08/2020  . Moderna Sars-Covid-2 Vaccination 06/15/2019, 07/13/2019, 04/19/2020, 02/27/2022  . 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 Pneumococcal Conjugate-13 12/06/2014  . Pneumococcal Polysaccharide-23 05/13/2001, 03/11/2009  . Polio, Unspecified 02/02/1958  . Tdap 11/21/2011  . Zoster Recombinant(Shingrix) 10/19/2022   Pertinent  Health Maintenance Due  Topic Date Due  . INFLUENZA VACCINE  01/10/2023  . DEXA SCAN  Completed      02/16/2022   12:00 AM 02/16/2022    9:45 AM 02/27/2022   11:41 AM 07/06/2022   10:04 AM 07/13/2022    3:55 PM  Fall Risk   Falls in the past year?    0 0  Was there an injury with Fall?    0 0  Fall Risk Category Calculator    0 0  (RETIRED) Patient Fall Risk Level High fall risk High fall risk High fall risk    Patient at Risk for Falls Due to   History of fall(s) No Fall Risks No Fall Risks  Fall risk Follow up   Falls evaluation completed  Falls evaluation completed   Functional Status Survey:    Vitals:   01/01/23 1109  BP: (!) 117/58  Pulse: 60  Resp: 17  Temp: (!) 97.5 F (36.4 C)  SpO2: 94%  Weight: 202 lb 12.8 oz (92 kg)  Height: 5\' 6"  (1.676 m)   Body mass index is 32.73 kg/m. Physical Exam Vitals and nursing note reviewed.  Constitutional:      Appearance: Normal appearance.  HENT:     Head: Normocephalic and atraumatic.     Nose: Nose normal.     Mouth/Throat:     Mouth: Mucous membranes are moist.  Eyes:     Extraocular Movements: Extraocular movements intact.     Conjunctiva/sclera: Conjunctivae normal.     Pupils: Pupils are equal, round, and reactive to light.  Cardiovascular:     Rate and Rhythm: Normal rate. Rhythm irregular.     Heart sounds: Murmur heard.     Comments: No dorsalis pedis pulses felt from previous examination.  Pulmonary:     Effort: Pulmonary  effort is normal.     Breath sounds: No rales.  Abdominal:     General: Bowel sounds are normal. There is no distension.     Palpations: Abdomen is soft.     Tenderness: There is no guarding or rebound.     Comments: Suprapubic discomfort.   Musculoskeletal:        General: Normal range of motion.     Cervical back: Normal range of motion and neck supple. No rigidity or tenderness.     Right lower leg: Edema present.     Left lower leg: Edema present.     Comments: Trace -1+ edema LLE>RLE  Lymphadenopathy:     Cervical: No cervical adenopathy.  Skin:    General: Skin is warm and [name].     Findings: Erythema present.     Comments: Mild dark erythema mid of R+L lower legs, chronic.     Neurological:      General: No focal deficit present.     Mental Status: She is alert and oriented to person, place, and time. Mental status is at baseline.     Cranial Nerves: No cranial nerve deficit.     Motor: No weakness.     Coordination: Coordination normal.     Gait: Gait abnormal.     Comments: 5/5 muscle strength in fingers.   Psychiatric:        Mood and Affect: Mood normal.        Behavior: Behavior normal.    Labs reviewed: Recent Labs    02/14/22 0808 02/15/22 0444 02/22/22 0000 03/09/22 1218 03/27/22 0000 06/28/22 0000 07/05/22 0000  NA 139 141   < > 140 142 141 140  K 3.9 3.4*   < > 4.3 4.1 4.4 4.0  CL 109 109   < > 105 106 104 104  CO2 26 25   < > 25 30* 30* 28*  GLUCOSE 102* 100*  --  81  --   --   --   BUN 19 15   < > 20 19 20 21   CREATININE 0.81 0.71   < > 1.03* 0.8 0.9 0.9  CALCIUM 8.4* 8.3*   < > 9.8 9.3 9.5 9.2  MG 2.0 2.2  --   --   --   --   --    < > = values in this interval not displayed.   Recent Labs    02/11/22 2351 02/22/22 0000 03/27/22 0000 06/28/22 0000 07/05/22 0000  AST 35   < > 28 29 23   ALT 17   < > 21 23 18   ALKPHOS 44   < > 53 46 44  BILITOT 1.1  --   --   --   --   PROT 6.7  --   --   --   --   ALBUMIN 3.5   < > 3.8 3.9 3.8   < > = values in this interval not displayed.   Recent Labs    02/14/22 0415 02/14/22 0808 02/15/22 0444 02/22/22 0000 03/27/22 0000 06/25/22 0000  WBC 9.3 9.4 6.7 7.8 4.9 4.9  NEUTROABS  --  7.5 4.1 4,930.00 2,386.00 2,279.00  HGB 12.1 12.2 11.9* 14.3 13.7 14.6  HCT 37.3 36.8 36.4 42 42 44  MCV 98.9 97.9 97.6  --   --   --   PLT 97* 95* 110* 211 151 153   Lab Results  Component Value Date   TSH 4.35 06/25/2022   No results  found for: "HGBA1C" Lab Results  Component Value Date   CHOL 193 05/13/2017   HDL 41 05/13/2017   LDLCALC 122 (H) 05/13/2017   TRIG 152 (H) 05/13/2017   CHOLHDL 4.7 (H) 05/13/2017    Significant Diagnostic Results in last 30 days:  No results found.  Assessment/Plan Senile  dementia (HCC)  tolerated Namenda well, helped with her mood as well. MMSE 26/30 03/23/22,  TSH 4.35 06/25/22. Functioning in [facility]  Paroxysmal atrial fibrillation (HCC)  on Metoprolol, Flecainide, Eliquis. Hgb 14.11/09/09/24  Essential hypertension, benign Blood pressure is controlled, on Metoprolol, Furosemide, intermittently elevated Sbp, f/u Cardiology.   Pacemaker  f/u Cardiology  Congestive heart failure (HCC)  edema BLE, on Furosemide. EF 70-75% 09/19/21, 03/27/22 Venous US, negative DVT. 03/30/22 arterial US mild to moderate obstructive disease BLE  CKD (chronic kidney disease) stage 3, GFR 30-59 ml/min (HCC) Bun/creat 21/0.9 07/05/22  Esophageal dysphagia  nectar liquid.   Hypothyroidism  taking Levothyroxine, TSH 4.35 06/25/22     Family/ staff Communication: plan of care reviewed with the patient and charge   Labs/tests ordered:  none  Time spend 40 minutes.

## 2023-01-01 NOTE — Assessment & Plan Note (Signed)
tolerated Namenda well, helped with her mood as well. MMSE 26/30 03/23/22,  TSH 4.35 06/25/22. Functioning in [facility]

## 2023-01-01 NOTE — Assessment & Plan Note (Signed)
edema BLE, on Furosemide. EF 70-75% 09/19/21, 03/27/22 Venous US, negative DVT. 03/30/22 arterial US mild to moderate obstructive disease BLE 

## 2023-01-01 NOTE — Assessment & Plan Note (Signed)
takes Tylenol, ambulates with walker.

## 2023-01-01 NOTE — Assessment & Plan Note (Signed)
Blood pressure is controlled,  on Metoprolol, Furosemide, intermittently elevated Sbp, f/u Cardiology.  

## 2023-01-01 NOTE — Assessment & Plan Note (Signed)
Bun/creat 21/0.9 07/05/22 

## 2023-01-01 NOTE — Assessment & Plan Note (Signed)
f/u Cardiology 

## 2023-01-01 NOTE — Assessment & Plan Note (Signed)
taking Levothyroxine, TSH 4.35 06/25/22

## 2023-01-01 NOTE — Assessment & Plan Note (Signed)
on Metoprolol, Flecainide, Eliquis. Hgb 14.11/09/09/24 

## 2023-01-01 NOTE — Assessment & Plan Note (Signed)
nectar liquid 

## 2023-01-02 ENCOUNTER — Encounter: Payer: Self-pay | Admitting: Nurse Practitioner

## 2023-01-11 ENCOUNTER — Encounter: Payer: Self-pay | Admitting: Nurse Practitioner

## 2023-01-11 ENCOUNTER — Non-Acute Institutional Stay: Payer: Medicare Other | Admitting: Nurse Practitioner

## 2023-01-11 ENCOUNTER — Ambulatory Visit (INDEPENDENT_AMBULATORY_CARE_PROVIDER_SITE_OTHER): Payer: Medicare Other

## 2023-01-11 DIAGNOSIS — R200 Anesthesia of skin: Secondary | ICD-10-CM | POA: Diagnosis not present

## 2023-01-11 DIAGNOSIS — I495 Sick sinus syndrome: Secondary | ICD-10-CM | POA: Diagnosis not present

## 2023-01-11 DIAGNOSIS — N1831 Chronic kidney disease, stage 3a: Secondary | ICD-10-CM | POA: Diagnosis not present

## 2023-01-11 DIAGNOSIS — I10 Essential (primary) hypertension: Secondary | ICD-10-CM

## 2023-01-11 DIAGNOSIS — E039 Hypothyroidism, unspecified: Secondary | ICD-10-CM

## 2023-01-11 DIAGNOSIS — H35373 Puckering of macula, bilateral: Secondary | ICD-10-CM | POA: Diagnosis not present

## 2023-01-11 DIAGNOSIS — I480 Paroxysmal atrial fibrillation: Secondary | ICD-10-CM

## 2023-01-11 DIAGNOSIS — I509 Heart failure, unspecified: Secondary | ICD-10-CM

## 2023-01-11 DIAGNOSIS — Z961 Presence of intraocular lens: Secondary | ICD-10-CM | POA: Diagnosis not present

## 2023-01-11 DIAGNOSIS — S90212A Contusion of left great toe with damage to nail, initial encounter: Secondary | ICD-10-CM | POA: Diagnosis not present

## 2023-01-11 DIAGNOSIS — H524 Presbyopia: Secondary | ICD-10-CM | POA: Diagnosis not present

## 2023-01-11 DIAGNOSIS — H16223 Keratoconjunctivitis sicca, not specified as Sjogren's, bilateral: Secondary | ICD-10-CM | POA: Diagnosis not present

## 2023-01-11 DIAGNOSIS — G3184 Mild cognitive impairment, so stated: Secondary | ICD-10-CM | POA: Diagnosis not present

## 2023-01-11 DIAGNOSIS — M159 Polyosteoarthritis, unspecified: Secondary | ICD-10-CM

## 2023-01-11 DIAGNOSIS — E876 Hypokalemia: Secondary | ICD-10-CM | POA: Diagnosis not present

## 2023-01-11 DIAGNOSIS — Z950 Presence of cardiac pacemaker: Secondary | ICD-10-CM

## 2023-01-11 DIAGNOSIS — H52223 Regular astigmatism, bilateral: Secondary | ICD-10-CM | POA: Diagnosis not present

## 2023-01-11 DIAGNOSIS — R1319 Other dysphagia: Secondary | ICD-10-CM

## 2023-01-11 DIAGNOSIS — H18513 Endothelial corneal dystrophy, bilateral: Secondary | ICD-10-CM | POA: Diagnosis not present

## 2023-01-11 LAB — CUP PACEART REMOTE DEVICE CHECK
Battery Remaining Longevity: 43 mo
Battery Remaining Percentage: 34 %
Battery Voltage: 2.96 V
Brady Statistic AP VP Percent: 1 %
Brady Statistic AP VS Percent: 96 %
Brady Statistic AS VP Percent: 1 %
Brady Statistic AS VS Percent: 2.6 %
Brady Statistic RA Percent Paced: 97 %
Brady Statistic RV Percent Paced: 1 %
Date Time Interrogation Session: 20240802040012
Implantable Lead Connection Status: 753985
Implantable Lead Connection Status: 753985
Implantable Lead Implant Date: 20091109
Implantable Lead Implant Date: 20091109
Implantable Lead Location: 753859
Implantable Lead Location: 753860
Implantable Lead Model: 350
Implantable Lead Serial Number: 24891460
Implantable Lead Serial Number: 28411861
Implantable Pulse Generator Implant Date: 20170227
Lead Channel Impedance Value: 1375 Ohm
Lead Channel Impedance Value: 540 Ohm
Lead Channel Pacing Threshold Amplitude: 0.75 V
Lead Channel Pacing Threshold Amplitude: 1 V
Lead Channel Pacing Threshold Pulse Width: 0.4 ms
Lead Channel Pacing Threshold Pulse Width: 0.4 ms
Lead Channel Sensing Intrinsic Amplitude: 1.6 mV
Lead Channel Sensing Intrinsic Amplitude: 12 mV
Lead Channel Setting Pacing Amplitude: 2 V
Lead Channel Setting Pacing Amplitude: 2.5 V
Lead Channel Setting Pacing Pulse Width: 0.4 ms
Lead Channel Setting Sensing Sensitivity: 2 mV
Pulse Gen Model: 2240
Pulse Gen Serial Number: 7885781

## 2023-01-11 NOTE — Progress Notes (Signed)
Location:  [facility] Room Number: [phone] Place of Service:  ALF [phone]) Provider:  Chipper Oman, NP  PCP: [name], MD  Patient Care Team: [name], MD as PCP - General (Family Medicine) [name], MD (Inactive) as PCP - Electrophysiology (Cardiology) [name], MD as PCP - Cardiology (Cardiology) [name], RN as Case Manager Helaine Chess [name], FNP (Hospice and Palliative Medicine)  Extended Emergency Contact Information Primary Emergency Contact: [facility] Address: [address]          [place], [place] [address] Darden Amber of [place] Home Phone: [phone] Mobile Phone: [phone] Relation: Daughter Secondary Emergency Contact: Gae Dry Home Phone: [phone] Mobile Phone: [phone] Relation: Daughter Interpreter needed? No  Code Status:  DNR Goals of care: Advanced Directive information    01/11/2023    3:01 PM  Advanced Directives  Does Patient Have a Medical Advance Directive? Yes  Type of Estate agent of [place];Out of facility DNR (pink MOST or yellow form)  Does patient want to make changes to medical advance directive? No - Patient declined  Copy of Healthcare Power of Attorney in Chart? Yes - validated most recent copy scanned in chart (See row information)     Chief Complaint  Patient presents with  . Acute Visit    Left Great Toe Bruise.     HPI:  Pt is a 89 y.o. female seen today for an acute visit for Left Great Toe Bruise.   01/11/23 stubbed left great toe in shower, resulted bruise and swelling, able to ROM with discomfort, the patient desires delaying X-Rico  01/11/23 family expressed concerning of hand and fingers tingling and numbness, desires checking TSH and K level, ? If Namenda is the culprit. The patient stated her tingling and numbness in hands and fingers has been the same, declined workup or neurology referral.    Past Medical History:   Diagnosis Date  . Aortic stenosis 03/08/2016  . Brady-tachy syndrome (HCC)    a. Biotronik dual chamber PPM implanted 2009 b. gen change to STJ dual chamber PPM 2017  . Ejection fraction    EF 70%, echo, October, 2009  //   EF 55-60%, septal dyssynergy consistent with a paced rhythm, mild to moderate mitral regurgitation, echo, November, 2015   . GERD (gastroesophageal reflux disease) 04/28/2014   Episodes November, 2015 with fluid refluxing from her esophagus.   . Hair loss    Patient questioned Coumadin, changed toPradaxa  . Hypertension   . Hypothyroidism   . Lower extremity edema 01/22/2018  . Mitral regurgitation 05/03/2014   Mild-to-moderate mitral regurgitation, echo, November, 2015   . Osteoarthritis of left knee 10/25/2017  . Paroxysmal atrial fibrillation (HCC)    Episodes rapid atrial fib noted by pacemaker interrogation, August, 2011, diltiazem added, patient improved. Patient continues on Rythmol  //   Changed to flecainide 2013  //   flecainide level checked in 2013, good level   . Persistent atrial fibrillation [facility])    Past Surgical History:  Procedure Laterality Date  . ABDOMINAL HYSTERECTOMY    . EP IMPLANTABLE DEVICE N/A 08/08/2015   Generator change with SJM Assurity DR PPM by Dr Johney Frame  . JOINT REPLACEMENT    . PACEMAKER PLACEMENT  2009        Allergies  Allergen Reactions  . Morphine And Codeine Other (See Comments)    Patient states that she went "crazy" with this  . Penicillins Swelling and Rash  Has patient had a PCN reaction causing immediate rash, facial/tongue/throat swelling, SOB or lightheadedness with hypotension: {Yes/No:30480221} Has patient had a PCN reaction causing severe rash involving mucus membranes or skin necrosis: {Yes/No:30480221} Has patient had a PCN reaction that required hospitalization {Yes/No:30480221} Has patient had a PCN reaction occurring within the last 10 years: {Yes/No:30480221} If all of the above answers are "NO", then   proceed with Cephalosporin use.  . Lasix [Furosemide] Itching    Patient stated it made her itch all over and dry.  . Aspirin     Pacemaker  . Clindamycin/Lincomycin Other (See Comments)    Any meds with mycin in the name Unknown reaction  . Crestor [Rosuvastatin]     Other reaction(s): muscle pain  . Diltiazem     Skin discoloration, lower extremity swelling  . Erythromycin Other (See Comments)    Any meds with mycin in the name Unknown reaction  . Lisinopril Other (See Comments)    REACTION: Cough  . Metronidazole     Other reaction(s): mouth sores, tongue swelling  . Penicillins Cross Reactors Other (See Comments)    Any meds with mycin in the name Unknown reaction    Outpatient Encounter Medications as of 01/11/2023  Medication Sig  . acetaminophen (TYLENOL) 325 MG tablet Take 2 tablets (650 mg total) by mouth every 6 (six) hours as needed.  Marland Kitchen acetaminophen (TYLENOL) 500 MG tablet Take 1,000 mg by mouth in the morning and at bedtime.  Marland Kitchen apixaban (ELIQUIS) 5 MG TABS tablet Take 1 tablet (5 mg total) by mouth 2 (two) times daily.  Marland Kitchen Apoaequorin (PREVAGEN) 10 MG CAPS Take 10 mg by mouth daily.  . betamethasone, augmented, (DIPROLENE) 0.05 % gel Apply topically daily.  Apply to scalp topically every 12 hours as needed for psoriasis related to PSORIASIS, UNSPECIFIED (L40.9) apply to affected area of scalp  . Calcium Carbonate-Vit D-Min (CALCIUM 600+D PLUS MINERALS) 600-400 MG-UNIT TABS Take 1 tablet by mouth daily.  . Carboxymethylcell-Glycerin PF (REFRESH RELIEVA PF) 0.5-1 % SOLN Apply 1 drop to eye in the morning and at bedtime. for relieve discomfort  . diclofenac Sodium (VOLTAREN) 1 % GEL Apply 2 g topically 4 (four) times daily.  . flecainide (TAMBOCOR) 50 MG tablet Take 1 tablet (50 mg total) by mouth 2 (two) times daily.  . furosemide (LASIX) 20 MG tablet Take 1 tablet (20 mg total) by mouth daily.  . hydrocortisone 2.5 % ointment Apply to affected areas topically as  needed for Rashy/itchy areas two times daily- SELF ADMINISTER-per pt request  . levothyroxine (SYNTHROID) 100 MCG tablet Take 1 tablet (100 mcg total) by mouth every morning.  . memantine (NAMENDA) 10 MG tablet Take 1 tablet (10 mg total) by mouth 2 (two) times daily. for cognitive decline  . metoprolol succinate (TOPROL-XL) 25 MG 24 hr tablet Take 3 tablets (75 mg total) by mouth daily.  . Multiple Vitamin (MULTIVITAMIN ADULT) TABS Take 1 tablet by mouth daily.  . Multiple Vitamins-Minerals (OCUVITE PO) Take 1 capsule by mouth daily.  . potassium chloride (KLOR-CON M) 10 MEQ tablet Take 1 tablet (10 mEq total) by mouth daily.  . sodium chloride (MURO 128) 5 % ophthalmic solution Place 1 drop into both eyes in the morning and at bedtime. for decrease swelling Wait 1 hour between these and Refresh   No facility-administered encounter medications on file as of 01/11/2023.    Review of Systems  Immunization History  Administered Date(s) Administered  . Fluad Quad(high Dose 65+) 04/02/2022  .  Influenza Split 03/11/2009, 03/11/2010, 02/25/2011, 02/24/2019, 03/08/2020, 03/25/2021  . Influenza, High Dose Seasonal PF 03/08/2015, 04/03/2017, 03/17/2018, 03/08/2020  . Influenza,inj,Quad PF,6+ Mos 03/08/2016  . Influenza,inj,quad, With Preservative 03/11/2017  . Moderna Covid-19 Vaccine Bivalent Booster 103yrs & up 10/27/2021  . Moderna SARS-COV2 Booster Vaccination 11/08/2020  . Moderna Sars-Covid-2 Vaccination 06/15/2019, 07/13/2019, 04/19/2020, 02/27/2022  . Research officer, trade [employer] 69yrs & up 02/28/2021, 04/12/2022  . Pneumococcal Conjugate-13 12/06/2014  . Pneumococcal Polysaccharide-23 05/13/2001, 03/11/2009  . Polio, Unspecified 02/02/1958  . Tdap 11/21/2011  . Zoster Recombinant(Shingrix) 07/18/2022, 10/19/2022   Pertinent  Health Maintenance Due  Topic Date Due  . INFLUENZA VACCINE  01/10/2023  . DEXA SCAN  Completed      02/16/2022   12:00 AM 02/16/2022    9:45 AM  02/27/2022   11:41 AM 07/06/2022   10:04 AM 07/13/2022    3:55 PM  Fall Risk  Falls in the past year?    0 0  Was there an injury with Fall?    0 0  Fall Risk Category Calculator    0 0  (RETIRED) Patient Fall Risk Level High fall risk High fall risk High fall risk    Patient at Risk for Falls Due to   History of fall(s) No Fall Risks No Fall Risks  Fall risk Follow up   Falls evaluation completed  Falls evaluation completed   Functional Status Survey:    Vitals:   01/11/23 1454  BP: (!) 155/65  Pulse: 64  Resp: 16  Temp: 97.8 F (36.6 C)  SpO2: 93%  Weight: 204 lb 6.4 oz (92.7 kg)  Height: 5\' 6"  (1.676 m)   Body mass index is 32.99 kg/m. Physical Exam  Labs reviewed: Recent Labs    02/14/22 0808 02/15/22 0444 02/22/22 0000 03/09/22 1218 03/27/22 0000 06/28/22 0000 07/05/22 0000  NA 139 141   < > 140 142 141 140  K 3.9 3.4*   < > 4.3 4.1 4.4 4.0  CL 109 109   < > 105 106 104 104  CO2 26 25   < > 25 30* 30* 28*  GLUCOSE 102* 100*  --  81  --   --   --   BUN 19 15   < > 20 19 20 21   CREATININE 0.81 0.71   < > 1.03* 0.8 0.9 0.9  CALCIUM 8.4* 8.3*   < > 9.8 9.3 9.5 9.2  MG 2.0 2.2  --   --   --   --   --    < > = values in this interval not displayed.   Recent Labs    02/11/22 2351 02/22/22 0000 03/27/22 0000 06/28/22 0000 07/05/22 0000  AST 35   < > 28 29 23   ALT 17   < > 21 23 18   ALKPHOS 44   < > 53 46 44  BILITOT 1.1  --   --   --   --   PROT 6.7  --   --   --   --   ALBUMIN 3.5   < > 3.8 3.9 3.8   < > = values in this interval not displayed.   Recent Labs    02/14/22 0415 02/14/22 0808 02/15/22 0444 02/22/22 0000 03/27/22 0000 06/25/22 0000  WBC 9.3 9.4 6.7 7.8 4.9 4.9  NEUTROABS  --  7.5 4.1 4,930.00 2,386.00 2,279.00  HGB 12.1 12.2 11.9* 14.3 13.7 14.6  HCT 37.3 36.8 36.4 42 42 44  MCV  98.9 97.9 97.6  --   --   --   PLT 97* 95* 110* 211 151 153   Lab Results  Component Value Date   TSH 4.35 06/25/2022   No results found for:  "HGBA1C" Lab Results  Component Value Date   CHOL 193 05/13/2017   HDL 41 05/13/2017   LDLCALC 122 (H) 05/13/2017   TRIG 152 (H) 05/13/2017   CHOLHDL 4.7 (H) 05/13/2017    Significant Diagnostic Results in last 30 days:  CUP PACEART REMOTE DEVICE CHECK  Result Date: 01/11/2023 Scheduled remote reviewed. Normal device function.  1 AMS, 30sec in duraiton Next remote 91 days. LA, CVRS   Assessment/Plan No problem-specific Assessment & Plan notes found for this encounter.     Family/ staff Communication: plan of care reviewed with the patient, the patient's HPOA daughter, and charge nurse.   Labs/tests ordered:  none  Time spend 40 minutes.

## 2023-01-12 ENCOUNTER — Encounter: Payer: Self-pay | Admitting: Nurse Practitioner

## 2023-01-13 ENCOUNTER — Encounter: Payer: Self-pay | Admitting: Nurse Practitioner

## 2023-01-13 DIAGNOSIS — S90112A Contusion of left great toe without damage to nail, initial encounter: Secondary | ICD-10-CM | POA: Insufficient documentation

## 2023-01-13 NOTE — Assessment & Plan Note (Signed)
tolerated Namenda well, helped with her mood as well. MMSE 26/30 03/23/22,  TSH 4.35 06/25/22

## 2023-01-13 NOTE — Assessment & Plan Note (Signed)
Hx of brady tachycardia syndrome.

## 2023-01-13 NOTE — Assessment & Plan Note (Signed)
K 4.0 07/05/22

## 2023-01-13 NOTE — Assessment & Plan Note (Signed)
01/11/23 stubbed left great toe in shower, resulted bruise and swelling, able to ROM with discomfort, the patient desires delaying X-Roger Will try to wrap(buddy) the left great toe and 2nd toe to immobilize, observe

## 2023-01-13 NOTE — Assessment & Plan Note (Addendum)
Heart rate is in control, on Metoprolol, Flecainide, Eliquis. Hgb 14.11/09/09/24, repeat CBC/diff

## 2023-01-13 NOTE — Assessment & Plan Note (Signed)
nectar liquid 

## 2023-01-13 NOTE — Assessment & Plan Note (Signed)
01/11/23 family expressed concerning of hand and fingers tingling and numbness, desires checking TSH and K level, ? If Namenda is the culprit. The patient stated her tingling and numbness in hands and fingers has been the same, declined workup or neurology referral.     Chronic numbness of R+L hands, has been using fingerless gloves which helped, declined workup, neurology referral, or medication   Update CMP/eGFR, TSH

## 2023-01-13 NOTE — Assessment & Plan Note (Signed)
Bun/creat 21/0.9 07/05/22 

## 2023-01-13 NOTE — Assessment & Plan Note (Signed)
takes Tylenol, ambulates with walker.

## 2023-01-13 NOTE — Assessment & Plan Note (Signed)
Blood pressure is controlled,  on Metoprolol, Furosemide, intermittently elevated Sbp, f/u Cardiology.  

## 2023-01-13 NOTE — Assessment & Plan Note (Signed)
taking Levothyroxine, TSH 4.35 06/25/22

## 2023-01-13 NOTE — Assessment & Plan Note (Signed)
Compensated, edema BLE, on Furosemide. EF 70-75% 09/19/21, 03/27/22 Venous US, negative DVT. 03/30/22 arterial US mild to moderate obstructive disease BLE

## 2023-01-17 DIAGNOSIS — I10 Essential (primary) hypertension: Secondary | ICD-10-CM | POA: Diagnosis not present

## 2023-01-17 DIAGNOSIS — Z79899 Other long term (current) drug therapy: Secondary | ICD-10-CM | POA: Diagnosis not present

## 2023-01-18 LAB — COMPREHENSIVE METABOLIC PANEL
Albumin: 3.7 (ref 3.5–5.0)
Calcium: 9.1 (ref 8.7–10.7)
Globulin: 2.9

## 2023-01-18 LAB — BASIC METABOLIC PANEL
BUN: 16 (ref 4–21)
CO2: 28 — AB (ref 13–22)
Chloride: 104 (ref 99–108)
Creatinine: 0.8 (ref 0.5–1.1)
Glucose: 122
Potassium: 3.7 meq/L (ref 3.5–5.1)
Sodium: 140 (ref 137–147)

## 2023-01-18 LAB — HEPATIC FUNCTION PANEL
ALT: 16 U/L (ref 7–35)
AST: 23 (ref 13–35)
Alkaline Phosphatase: 37 (ref 25–125)
Bilirubin, Total: 0.6

## 2023-01-18 LAB — CBC AND DIFFERENTIAL
HCT: 41 (ref 36–46)
Hemoglobin: 13.4 (ref 12.0–16.0)
Neutrophils Absolute: 2782
Platelets: 113 10*3/uL — AB (ref 150–400)
WBC: 5.2

## 2023-01-18 LAB — CBC: RBC: 4.14 (ref 3.87–5.11)

## 2023-01-18 LAB — TSH: TSH: 3.81 (ref 0.41–5.90)

## 2023-01-21 NOTE — Progress Notes (Signed)
Remote pacemaker transmission.   

## 2023-01-23 ENCOUNTER — Encounter: Payer: Self-pay | Admitting: Adult Health

## 2023-01-23 ENCOUNTER — Non-Acute Institutional Stay: Payer: Medicare Other | Admitting: Adult Health

## 2023-01-23 DIAGNOSIS — L03116 Cellulitis of left lower limb: Secondary | ICD-10-CM | POA: Diagnosis not present

## 2023-01-23 DIAGNOSIS — G3184 Mild cognitive impairment, so stated: Secondary | ICD-10-CM | POA: Diagnosis not present

## 2023-01-23 NOTE — Progress Notes (Signed)
Location:  [facility] Room Number: [phone] Place of Service:  ALF [phone]) Provider:  Kenard Gower, DNP, FNP-BC  Patient Care Team: [name], MD as PCP - General (Family Medicine) [name], MD (Inactive) as PCP - Electrophysiology (Cardiology) [name], MD as PCP - Cardiology (Cardiology) [name], RN as Case Manager Helaine Chess [name], FNP (Hospice and Palliative Medicine)  Extended Emergency Contact Information Primary Emergency Contact: [facility] Address: [address]          [place], [place] [address] Darden Amber of [place] Home Phone: [phone] Mobile Phone: [phone] Relation: Daughter Secondary Emergency Contact: Gae Dry Home Phone: [phone] Mobile Phone: [phone] Relation: Daughter Interpreter needed? No  Code Status:   DNR  Goals of care: Advanced Directive information    01/11/2023    3:01 PM  Advanced Directives  Does Patient Have a Medical Advance Directive? Yes  Type of Estate agent of [place];Out of facility DNR (pink MOST or yellow form)  Does patient want to make changes to medical advance directive? No - Patient declined  Copy of Healthcare Power of Attorney in Chart? Yes - validated most recent copy scanned in chart (See row information)     Chief Complaint  Patient presents with   Acute Visit    Leg wound with drainage and erythematous    HPI:  [name] is a 89 y.o. female seen today for an acute visit regarding leg wound with drainage and erythematous. She is a resident of [facility]. She was seen in her room today while sitting in her recliner. She has a wound on her left lower leg which is erythematous with moderate amount of serosanguinous drainage. Bilateral lower extremities with 2+edema. No reported chills nor fever. Staff reported that she removes the bandage that was put on her legs, rubs wound with her hands then cover with her  own bandage.   She has BIMS score of 9/15, taking Memantine for dementia.  Past Medical History:  Diagnosis Date   Aortic stenosis 03/08/2016   Brady-tachy syndrome (HCC)    a. Biotronik dual chamber PPM implanted 2009 b. gen change to STJ dual chamber PPM 2017   Ejection fraction    EF 70%, echo, October, 2009  //   EF 55-60%, septal dyssynergy consistent with a paced rhythm, mild to moderate mitral regurgitation, echo, November, 2015    GERD (gastroesophageal reflux disease) 04/28/2014   Episodes November, 2015 with fluid refluxing from her esophagus.    Hair loss    Patient questioned Coumadin, changed toPradaxa   Hypertension    Hypothyroidism    Lower extremity edema 01/22/2018   Mitral regurgitation 05/03/2014   Mild-to-moderate mitral regurgitation, echo, November, 2015    Osteoarthritis of left knee 10/25/2017   Paroxysmal atrial fibrillation (HCC)    Episodes rapid atrial fib noted by pacemaker interrogation, August, 2011, diltiazem added, patient improved. Patient continues on Rythmol  //   Changed to flecainide 2013  //   flecainide level checked in 2013, good level    Persistent atrial fibrillation (HCC)    Past Surgical History:  Procedure Laterality Date   ABDOMINAL HYSTERECTOMY     EP IMPLANTABLE DEVICE N/A 08/08/2015   Generator change with SJM Assurity DR PPM by Dr Johney Frame   JOINT REPLACEMENT     PACEMAKER PLACEMENT  2009        Allergies  Allergen Reactions   Morphine And Codeine Other (See Comments)  Patient states that she went "crazy" with this   Penicillins Swelling and Rash    Has patient had a PCN reaction causing immediate rash, facial/tongue/throat swelling, SOB or lightheadedness with hypotension: No Has patient had a PCN reaction causing severe rash involving mucus membranes or skin necrosis: No Has patient had a PCN reaction that required hospitalization No Has patient had a PCN reaction occurring within the last 10 years: No If all of the above  answers are "NO", then may proceed with Cephalosporin use.   Lasix [Furosemide] Itching    Patient stated it made her itch all over and dry.   Aspirin     Pacemaker   Clindamycin/Lincomycin Other (See Comments)    Any meds with mycin in the name Unknown reaction   Crestor [Rosuvastatin]     Other reaction(s): muscle pain   Diltiazem     Skin discoloration, lower extremity swelling   Erythromycin Other (See Comments)    Any meds with mycin in the name Unknown reaction   Lisinopril Other (See Comments)    REACTION: Cough   Metronidazole     Other reaction(s): mouth sores, tongue swelling   Penicillins Cross Reactors Other (See Comments)    Any meds with mycin in the name Unknown reaction    Outpatient Encounter Medications as of 01/23/2023  Medication Sig   acetaminophen (TYLENOL) 325 MG tablet Take 2 tablets (650 mg total) by mouth every 6 (six) hours as needed.   acetaminophen (TYLENOL) 500 MG tablet Take 1,000 mg by mouth in the morning and at bedtime.   apixaban (ELIQUIS) 5 MG TABS tablet Take 1 tablet (5 mg total) by mouth 2 (two) times daily.   Apoaequorin (PREVAGEN) 10 MG CAPS Take 10 mg by mouth daily.   betamethasone, augmented, (DIPROLENE) 0.05 % gel Apply topically daily.  Apply to scalp topically every 12 hours as needed for psoriasis related to PSORIASIS, UNSPECIFIED (L40.9) apply to affected area of scalp   Calcium Carbonate-Vit D-Min (CALCIUM 600+D PLUS MINERALS) 600-400 MG-UNIT TABS Take 1 tablet by mouth daily.   Carboxymethylcell-Glycerin PF (REFRESH RELIEVA PF) 0.5-1 % SOLN Apply 1 drop to eye in the morning and at bedtime. for relieve discomfort   diclofenac Sodium (VOLTAREN) 1 % GEL Apply 2 g topically 4 (four) times daily.   flecainide (TAMBOCOR) 50 MG tablet Take 1 tablet (50 mg total) by mouth 2 (two) times daily.   furosemide (LASIX) 20 MG tablet Take 1 tablet (20 mg total) by mouth daily.   hydrocortisone 2.5 % ointment Apply to affected areas topically as  needed for Rashy/itchy areas two times daily-MAY SELF ADMINISTER-per [name] request   levothyroxine (SYNTHROID) 100 MCG tablet Take 1 tablet (100 mcg total) by mouth every morning.   memantine (NAMENDA) 10 MG tablet Take 1 tablet (10 mg total) by mouth 2 (two) times daily. for cognitive decline   metoprolol succinate (TOPROL-XL) 25 MG 24 hr tablet Take 3 tablets (75 mg total) by mouth daily.   Multiple Vitamin (MULTIVITAMIN ADULT) TABS Take 1 tablet by mouth daily.   Multiple Vitamins-Minerals (OCUVITE PO) Take 1 capsule by mouth daily.   potassium chloride (KLOR-CON M) 10 MEQ tablet Take 1 tablet (10 mEq total) by mouth daily.   sodium chloride (MURO 128) 5 % ophthalmic solution Place 1 drop into both eyes in the morning and at bedtime. for decrease swelling Wait 1 hour between these and Refresh   No facility-administered encounter medications on file as of 01/23/2023.  Review of Systems  Constitutional:  Negative for appetite change, chills, fatigue and fever.  HENT:  Negative for congestion, hearing loss, rhinorrhea and sore throat.   Eyes: Negative.   Respiratory:  Negative for cough, shortness of breath and wheezing.   Cardiovascular:  Positive for leg swelling. Negative for chest pain and palpitations.  Gastrointestinal:  Negative for abdominal pain, constipation, diarrhea, nausea and vomiting.  Genitourinary:  Negative for dysuria.  Musculoskeletal:  Negative for arthralgias, back pain and myalgias.  Skin:  Positive for wound. Negative for color change and rash.  Neurological:  Negative for dizziness, weakness and headaches.  Psychiatric/Behavioral:  Negative for behavioral problems. The patient is not nervous/anxious.       Immunization History  Administered Date(s) Administered   Fluad Quad(high Dose 65+) 04/02/2022   Influenza Split 03/11/2009, 03/11/2010, 02/25/2011, 02/24/2019, 03/08/2020, 03/25/2021   Influenza, High Dose Seasonal PF 03/08/2015, 04/03/2017, 03/17/2018,  03/08/2020   Influenza,inj,Quad PF,6+ Mos 03/08/2016   Influenza,inj,quad, With Preservative 03/11/2017   Moderna Covid-19 Vaccine Bivalent Booster 26yrs & up 10/27/2021   Moderna SARS-COV2 Booster Vaccination 11/08/2020   Moderna Sars-Covid-2 Vaccination 06/15/2019, 07/13/2019, 04/19/2020, 02/27/2022   Pfizer Covid-19 Vaccine Bivalent Booster 33yrs & up 02/28/2021, 04/12/2022   Pneumococcal Conjugate-13 12/06/2014   Pneumococcal Polysaccharide-23 05/13/2001, 03/11/2009   Polio, Unspecified 02/02/1958   Tdap 11/21/2011   Zoster Recombinant(Shingrix) 07/18/2022, 10/19/2022   Pertinent  Health Maintenance Due  Topic Date Due   INFLUENZA VACCINE  01/10/2023   DEXA SCAN  Completed      02/16/2022   12:00 AM 02/16/2022    9:45 AM 02/27/2022   11:41 AM 07/06/2022   10:04 AM 07/13/2022    3:55 PM  Fall Risk  Falls in the past year?    0 0  Was there an injury with Fall?    0 0  Fall Risk Category Calculator    0 0  (RETIRED) Patient Fall Risk Level High fall risk High fall risk High fall risk    Patient at Risk for Falls Due to   History of fall(s) No Fall Risks No Fall Risks  Fall risk Follow up   Falls evaluation completed  Falls evaluation completed     Vitals:   01/23/23 1457  BP: 137/72  Pulse: 60  Resp: 18  Temp: 97.6 F (36.4 C)  SpO2: 96%  Weight: 204 lb 6.4 oz (92.7 kg)  Height: 5\' 6"  (1.676 m)   Body mass index is 32.99 kg/m.  Physical Exam Constitutional:      General: She is not in acute distress.    Appearance: She is obese.  HENT:     Head: Normocephalic and atraumatic.     Nose: Nose normal.     Mouth/Throat:     Mouth: Mucous membranes are moist.  Eyes:     Conjunctiva/sclera: Conjunctivae normal.  Cardiovascular:     Rate and Rhythm: Normal rate and regular rhythm.  Pulmonary:     Effort: Pulmonary effort is normal.     Breath sounds: Normal breath sounds.  Abdominal:     General: Bowel sounds are normal.     Palpations: Abdomen is soft.   Musculoskeletal:        General: Swelling present. Normal range of motion.     Cervical back: Normal range of motion.     Right lower leg: Edema present.     Left lower leg: Edema present.     Comments: BLE 2+edema  Skin:    General: Skin  is warm and dry.     Findings: Erythema and lesion present.     Comments: Left lower leg wound with erythema.  Neurological:     Mental Status: Mental status is at baseline.     Comments: Alert to self and time, disoriented to place.  Psychiatric:        Mood and Affect: Mood normal.        Behavior: Behavior normal.     Labs reviewed: Recent Labs    02/14/22 0808 02/15/22 0444 02/22/22 0000 03/09/22 1218 03/27/22 0000 06/28/22 0000 07/05/22 0000  NA 139 141   < > 140 142 141 140  K 3.9 3.4   < > 4.3 4.1 4.4 4.0  CL 109 109   < > 105 106 104 104  CO2 26 25   < > 25 30 30  28*  GLUCOSE 102 100  --  81  --   --   --   BUN 19 15   < > 20 19 20 21   CREATININE 0.81 0.71   < > 1.03 0.8 0.9 0.9  CALCIUM 8.4 8.3   < > 9.8 9.3 9.5 9.2  MG 2.0 2.2  --   --   --   --   --    < > = values in this interval not displayed.   Recent Labs    02/11/22 2351 02/22/22 0000 03/27/22 0000 06/28/22 0000 07/05/22 0000  AST 35   < > 28 29 23   ALT 17   < > 21 23 18   ALKPHOS 44   < > 53 46 44  BILITOT 1.1  --   --   --   --   PROT 6.7  --   --   --   --   ALBUMIN 3.5   < > 3.8 3.9 3.8   < > = values in this interval not displayed.   Recent Labs    02/14/22 0415 02/14/22 0808 02/15/22 0444 02/22/22 0000 03/27/22 0000 06/25/22 0000  WBC 9.3 9.4 6.7 7.8 4.9 4.9  NEUTROABS  --  7.5 4.1 4,930.00 2,386.00 2,279.00  HGB 12.1 12.2 11.9* 14.3 13.7 14.6  HCT 37.3 36.8 36.4 42 42 44  MCV 98.9 97.9 97.6  --   --   --   PLT 97* 95* 110* 211 151 153   Lab Results  Component Value Date   TSH 4.35 06/25/2022   No results found for: "HGBA1C" Lab Results  Component Value Date   CHOL 193 05/13/2017   HDL 41 05/13/2017   LDLCALC 122 (H) 05/13/2017    TRIG 152 (H) 05/13/2017   CHOLHDL 4.7 (H) 05/13/2017    Significant Diagnostic Results in last 30 days:  CUP PACEART REMOTE DEVICE CHECK  Result Date: 01/11/2023 Scheduled remote reviewed. Normal device function.  1 AMS, 30sec in duraiton Next remote 91 days. LA, CVRS   Assessment/Plan  1. Left leg cellulitis -  will start on Doxycycline 100 mg BID X 7 days -  continue treatment daily  2. Mild cognitive impairment -  BIMS score 9/15, ranging as moderate cognitive impairment -   fall precautions   Family/ staff Communication: Discussed plan of care with resident and charge nurse.  Labs/tests ordered:  None    Kenard Gower, DNP, MSN, FNP-BC [facility] [phone] (Monday-Friday 8:00 a.m. - 5:00 p.m.) [phone] (after hours)

## 2023-01-24 ENCOUNTER — Non-Acute Institutional Stay: Payer: Medicare Other | Admitting: Nurse Practitioner

## 2023-01-24 ENCOUNTER — Encounter: Payer: Self-pay | Admitting: Nurse Practitioner

## 2023-01-24 DIAGNOSIS — G3184 Mild cognitive impairment, so stated: Secondary | ICD-10-CM | POA: Diagnosis not present

## 2023-01-24 DIAGNOSIS — I10 Essential (primary) hypertension: Secondary | ICD-10-CM

## 2023-01-24 DIAGNOSIS — I4819 Other persistent atrial fibrillation: Secondary | ICD-10-CM | POA: Diagnosis not present

## 2023-01-24 DIAGNOSIS — T148XXA Other injury of unspecified body region, initial encounter: Secondary | ICD-10-CM

## 2023-01-24 DIAGNOSIS — R200 Anesthesia of skin: Secondary | ICD-10-CM | POA: Diagnosis not present

## 2023-01-24 DIAGNOSIS — E039 Hypothyroidism, unspecified: Secondary | ICD-10-CM

## 2023-01-24 DIAGNOSIS — E876 Hypokalemia: Secondary | ICD-10-CM | POA: Diagnosis not present

## 2023-01-24 DIAGNOSIS — L089 Local infection of the skin and subcutaneous tissue, unspecified: Secondary | ICD-10-CM | POA: Insufficient documentation

## 2023-01-24 DIAGNOSIS — I509 Heart failure, unspecified: Secondary | ICD-10-CM

## 2023-01-24 DIAGNOSIS — N1831 Chronic kidney disease, stage 3a: Secondary | ICD-10-CM

## 2023-01-24 NOTE — Assessment & Plan Note (Signed)
Heart rate is in control, continue Metoprolol, Flecainide, Eliquis. Hgb 14.11/09/09/24. update CBC/diff, CMP/eGFR, TSH

## 2023-01-24 NOTE — Assessment & Plan Note (Signed)
Bun/creat 21/0.9 07/05/22 

## 2023-01-24 NOTE — Assessment & Plan Note (Signed)
Chronic numbness of R+L hands, has been using fingerless gloves which helped, declined workup, neurology referral, or medication

## 2023-01-24 NOTE — Assessment & Plan Note (Signed)
 tolerated Namenda well, helped with her mood as well. MMSE 26/30 03/23/22,  TSH 4.35 06/25/22

## 2023-01-24 NOTE — Assessment & Plan Note (Signed)
edema BLE, on Furosemide. EF 70-75% 09/19/21, 03/27/22 Venous US, negative DVT. 03/30/22 arterial US mild to moderate obstructive disease BLE 

## 2023-01-24 NOTE — Progress Notes (Signed)
Location:   [facility] Room Number: 824A Place of Service:  ALF (13) Provider: Arna Snipe Bergen Melle NP  [name], MD  Patient Care Team: [name], MD as PCP - General (Family Medicine) [name], MD (Inactive) as PCP - Electrophysiology (Cardiology) [name], MD as PCP - Cardiology (Cardiology) [name], RN as Case Manager Helaine Chess [name], FNP (Hospice and Palliative Medicine)  Extended Emergency Contact Information Primary Emergency Contact: [facility] Address: [address]          [place], [place] [address] Darden Amber of [place] Home Phone: [phone] Mobile Phone: [phone] Relation: Daughter Secondary Emergency Contact: Gae Dry Home Phone: [phone] Mobile Phone: [phone] Relation: Daughter Interpreter needed? No  Code Status: DNR Goals of care: Advanced Directive information    01/24/2023    1:32 PM  Advanced Directives  Does Patient Have a Medical Advance Directive? Yes  Type of Estate agent of [place];Out of facility DNR (pink MOST or yellow form)  Does patient want to make changes to medical advance directive? No - Patient declined  Copy of Healthcare Power of Attorney in Chart? Yes - validated most recent copy scanned in chart (See row information)     Chief Complaint  Patient presents with   Acute Visit    Patient is being seen for acute leg swelling    Immunizations    Patient is due for tdap, covid vaccine, and flu vaccine     HPI:  Pt is a 89 y.o. female seen today for an acute visit for the left lower leg open wounds x2, HPOA requested to dc Xeroform, treat with Neosporin oint. Placed on Doxy for cellulitis, mild erythema and warmth present, open areas are superficial skin missing, no odorous drainage.    01/11/23 stubbed left great toe in shower, resulted bruise and swelling, able to ROM with discomfort, the patient desires delaying X-Lamay             01/11/23 family  expressed concerning of hand and fingers tingling and numbness, desires checking TSH and K level, ? If Namenda is the culprit. The patient stated her tingling and numbness in hands and fingers has been the same, declined workup or neurology referral.                           Chronic numbness of R+L hands, has been using fingerless gloves which helped, declined workup, neurology referral, or medication              Senile dementia, tolerated Namenda well, helped with her mood as well. MMSE 26/30 03/23/22,  TSH 4.35 06/25/22             Afib, on Metoprolol, Flecainide, Eliquis. Hgb 14.11/09/09/24             AS moderate             HTN on Metoprolol, Furosemide, intermittently elevated Sbp, f/u Cardiology.              Pacemaker, f/u Cardiology             CHF, edema BLE, on Furosemide. EF 70-75% 09/19/21, 03/27/22 Venous US, negative DVT. 03/30/22 arterial US mild to moderate obstructive disease BLE             CKD Bun/creat 21/0.9 07/05/22             Dysphagia, nectar liquid.  Hypothyroidism, taking Levothyroxine, TSH 4.35 06/25/22             OA, takes Tylenol, ambulates with walker.              CT head 02/12/22 Mild age-related atrophy and chronic microvascular ischemic changes.             Hypokalemia, K 4.0 07/05/22  Past Medical History:  Diagnosis Date   Aortic stenosis 03/08/2016   Brady-tachy syndrome (HCC)    a. Biotronik dual chamber PPM implanted 2009 b. gen change to STJ dual chamber PPM 2017   Ejection fraction    EF 70%, echo, October, 2009  //   EF 55-60%, septal dyssynergy consistent with a paced rhythm, mild to moderate mitral regurgitation, echo, November, 2015    GERD (gastroesophageal reflux disease) 04/28/2014   Episodes November, 2015 with fluid refluxing from her esophagus.    Hair loss    Patient questioned Coumadin, changed toPradaxa   Hypertension    Hypothyroidism    Lower extremity edema 01/22/2018   Mitral regurgitation 05/03/2014   Mild-to-moderate  mitral regurgitation, echo, November, 2015    Osteoarthritis of left knee 10/25/2017   Paroxysmal atrial fibrillation (HCC)    Episodes rapid atrial fib noted by pacemaker interrogation, August, 2011, diltiazem added, patient improved. Patient continues on Rythmol  //   Changed to flecainide 2013  //   flecainide level checked in 2013, good level    Persistent atrial fibrillation (HCC)    Past Surgical History:  Procedure Laterality Date   ABDOMINAL HYSTERECTOMY     EP IMPLANTABLE DEVICE N/A 08/08/2015   Generator change with SJM Assurity DR PPM by Dr Johney Frame   JOINT REPLACEMENT     PACEMAKER PLACEMENT  2009             Review of Systems  Constitutional:  Negative for appetite change, fatigue and fever.  HENT:  Positive for trouble swallowing. Negative for congestion.   Eyes:  Negative for visual disturbance.  Respiratory:  Negative for shortness of breath.   Cardiovascular:  Positive for leg swelling. Negative for chest pain and palpitations.  Gastrointestinal:  Negative for constipation.  Genitourinary:  Negative for dysuria and frequency.  Musculoskeletal:  Positive for arthralgias and gait problem.       Knees, L>R, intermittent in nature.   Skin:        Chronic venous insufficiency skin changes: mild erythema in mid of R+L,  the left lower leg open wounds x2, mild erythema and warmth present, open areas are superficial skin missing, no odorous drainage.    Neurological:  Positive for numbness. Negative for tremors, speech difficulty, weakness and headaches.       Memory lapses. chronic numbness in fingers.   Psychiatric/Behavioral:  Negative for confusion and sleep disturbance. The patient is not nervous/anxious.     Immunization History  Administered Date(s) Administered   Fluad Quad(high Dose 65+) 04/02/2022   Influenza Split 03/11/2009, 03/11/2010, 02/25/2011, 02/24/2019, 03/08/2020, 03/25/2021   Influenza, High Dose Seasonal PF 03/08/2015, 04/03/2017, 03/17/2018,  03/08/2020   Influenza,inj,Quad PF,6+ Mos 03/08/2016   Influenza,inj,quad, With Preservative 03/11/2017   Moderna Covid-19 Vaccine Bivalent Booster 8yrs & up 10/27/2021   Moderna SARS-COV2 Booster Vaccination 11/08/2020   Moderna Sars-Covid-2 Vaccination 06/15/2019, 07/13/2019, 04/19/2020, 02/27/2022   Pfizer Covid-19 Vaccine Bivalent Booster 26yrs & up 02/28/2021, 04/12/2022   Pneumococcal Conjugate-13 12/06/2014   Pneumococcal Polysaccharide-23 05/13/2001, 03/11/2009   Polio, Unspecified 02/02/1958   Tdap 11/21/2011   Zoster Recombinant(Shingrix)  07/18/2022, 10/19/2022   Pertinent  Health Maintenance Due  Topic Date Due   INFLUENZA VACCINE  01/10/2023   DEXA SCAN  Completed      02/16/2022   12:00 AM 02/16/2022    9:45 AM 02/27/2022   11:41 AM 07/06/2022   10:04 AM 07/13/2022    3:55 PM  Fall Risk  Falls in the past year?    0 0  Was there an injury with Fall?    0 0  Fall Risk Category Calculator    0 0  (RETIRED) Patient Fall Risk Level High fall risk High fall risk High fall risk    Patient at Risk for Falls Due to   History of fall(s) No Fall Risks No Fall Risks  Fall risk Follow up   Falls evaluation completed  Falls evaluation completed   Functional Status Survey:    Vitals:   01/24/23 1328  BP: (!) 156/76  Pulse: 60  Resp: 18  Temp: 97.7 F (36.5 C)  TempSrc: Temporal  SpO2: 94%  Weight: 204 lb 6.4 oz (92.7 kg)  Height: 5\' 6"  (1.676 m)   Body mass index is 32.99 kg/m. Physical Exam  Labs reviewed: Recent Labs    02/14/22 0808 02/15/22 0444 02/22/22 0000 03/09/22 1218 03/27/22 0000 06/28/22 0000 07/05/22 0000  NA 139 141   < > 140 142 141 140  K 3.9 3.4*   < > 4.3 4.1 4.4 4.0  CL 109 109   < > 105 106 104 104  CO2 26 25   < > 25 30* 30* 28*  GLUCOSE 102* 100*  --  81  --   --   --   BUN 19 15   < > 20 19 20 21   CREATININE 0.81 0.71   < > 1.03* 0.8 0.9 0.9  CALCIUM 8.4* 8.3*   < > 9.8 9.3 9.5 9.2  MG 2.0 2.2  --   --   --   --   --    < > =  values in this interval not displayed.   Recent Labs    02/11/22 2351 02/22/22 0000 03/27/22 0000 06/28/22 0000 07/05/22 0000  AST 35   < > 28 29 23   ALT 17   < > 21 23 18   ALKPHOS 44   < > 53 46 44  BILITOT 1.1  --   --   --   --   PROT 6.7  --   --   --   --   ALBUMIN 3.5   < > 3.8 3.9 3.8   < > = values in this interval not displayed.   Recent Labs    02/14/22 0415 02/14/22 0808 02/15/22 0444 02/22/22 0000 03/27/22 0000 06/25/22 0000  WBC 9.3 9.4 6.7 7.8 4.9 4.9  NEUTROABS  --  7.5 4.1 4,930.00 2,386.00 2,279.00  HGB 12.1 12.2 11.9* 14.3 13.7 14.6  HCT 37.3 36.8 36.4 42 42 44  MCV 98.9 97.9 97.6  --   --   --   PLT 97* 95* 110* 211 151 153   Lab Results  Component Value Date   TSH 4.35 06/25/2022   No results found for: "HGBA1C" Lab Results  Component Value Date   CHOL 193 05/13/2017   HDL 41 05/13/2017   LDLCALC 122 (H) 05/13/2017   TRIG 152 (H) 05/13/2017   CHOLHDL 4.7 (H) 05/13/2017    Significant Diagnostic Results in last 30 days:  CUP PACEART REMOTE DEVICE CHECK  Result Date: 01/11/2023 Scheduled remote reviewed. Normal device function.  1 AMS, 30sec in duraiton Next remote 91 days. LA, CVRS   Assessment/Plan: Infected wound the left lower leg open wounds x2, HPOA requested to dc Xeroform, treat with Neosporin oint. Placed on Doxy for cellulitis, mild erythema and warmth present, open areas are superficial skin missing, no odorous drainage.    Numbness of fingers of both hands Chronic numbness of R+L hands, has been using fingerless gloves which helped, declined workup, neurology referral, or medication  Mild cognitive impairment  tolerated Namenda well, helped with her mood as well. MMSE 26/30 03/23/22,  TSH 4.35 06/25/22  Persistent atrial fibrillation (HCC) Heart rate is in control, continue Metoprolol, Flecainide, Eliquis. Hgb 14.11/09/09/24. update CBC/diff, CMP/eGFR, TSH  Hypertension on Metoprolol, Furosemide, intermittently elevated Sbp,  f/u Cardiology  Congestive heart failure (HCC) edema BLE, on Furosemide. EF 70-75% 09/19/21, 03/27/22 Venous US, negative DVT. 03/30/22 arterial US mild to moderate obstructive disease BLE  CKD (chronic kidney disease) stage 3, GFR 30-59 ml/min (HCC) Bun/creat 21/0.9 07/05/22  Hypothyroidism  taking Levothyroxine, TSH 4.35 06/25/22  Hypokalemia, inadequate intake K 4.0 07/05/22    Family/ staff Communication: plan of care reviewed with the patient and charge nurse.   Labs/tests ordered: CBC/diff, CMP/eGFR, TSH  Time spend 40 minutes.

## 2023-01-24 NOTE — Assessment & Plan Note (Signed)
on Metoprolol, Furosemide, intermittently elevated Sbp, f/u Cardiology.  

## 2023-01-24 NOTE — Assessment & Plan Note (Signed)
taking Levothyroxine, TSH 4.35 06/25/22

## 2023-01-24 NOTE — Assessment & Plan Note (Signed)
the left lower leg open wounds x2, HPOA requested to dc Xeroform, treat with Neosporin oint. Placed on Doxy for cellulitis, mild erythema and warmth present, open areas are superficial skin missing, no odorous drainage.

## 2023-01-24 NOTE — Assessment & Plan Note (Signed)
 K 4.0 07/05/22

## 2023-01-25 ENCOUNTER — Encounter: Payer: Self-pay | Admitting: Nurse Practitioner

## 2023-01-25 DIAGNOSIS — I10 Essential (primary) hypertension: Secondary | ICD-10-CM | POA: Diagnosis not present

## 2023-01-25 DIAGNOSIS — Z79899 Other long term (current) drug therapy: Secondary | ICD-10-CM | POA: Diagnosis not present

## 2023-01-26 LAB — CBC AND DIFFERENTIAL
HCT: 41 (ref 36–46)
Hemoglobin: 13.6 (ref 12.0–16.0)
Neutrophils Absolute: 1697
Platelets: 121 10*3/uL — AB (ref 150–400)
WBC: 4.6

## 2023-01-26 LAB — BASIC METABOLIC PANEL
BUN: 13 (ref 4–21)
CO2: 24 — AB (ref 13–22)
Chloride: 103 (ref 99–108)
Creatinine: 0.9 (ref 0.5–1.1)
Glucose: 101
Potassium: 4.3 meq/L (ref 3.5–5.1)
Sodium: 139 (ref 137–147)

## 2023-01-26 LAB — HEPATIC FUNCTION PANEL
ALT: 17 U/L (ref 7–35)
AST: 24 (ref 13–35)
Alkaline Phosphatase: 40 (ref 25–125)
Bilirubin, Total: 0.4

## 2023-01-26 LAB — COMPREHENSIVE METABOLIC PANEL
Albumin: 3.7 (ref 3.5–5.0)
Calcium: 9.6 (ref 8.7–10.7)
Globulin: 2.9

## 2023-01-26 LAB — CBC: RBC: 4.18 (ref 3.87–5.11)

## 2023-01-26 LAB — TSH: TSH: 4.38 (ref 0.41–5.90)

## 2023-01-28 ENCOUNTER — Ambulatory Visit: Payer: Medicare Other | Admitting: Physician Assistant

## 2023-01-29 DIAGNOSIS — M2012 Hallux valgus (acquired), left foot: Secondary | ICD-10-CM | POA: Diagnosis not present

## 2023-01-29 DIAGNOSIS — L602 Onychogryphosis: Secondary | ICD-10-CM | POA: Diagnosis not present

## 2023-01-29 DIAGNOSIS — M2011 Hallux valgus (acquired), right foot: Secondary | ICD-10-CM | POA: Diagnosis not present

## 2023-02-16 IMAGING — US US SOFT TISSUE HEAD/NECK
1 series · 14 of 15 positions shown · non-contrast
Comparison: None.

CLINICAL DATA: Neck mass.

EXAM:
ULTRASOUND OF HEAD/NECK SOFT TISSUES
TECHNIQUE: Ultrasound examination of the head and neck soft tissues was
performed in the area of clinical concern.

[Series 1: us soft tissue head/neck · 15 acquisitions, 14 frames shown]
[im 1/15]
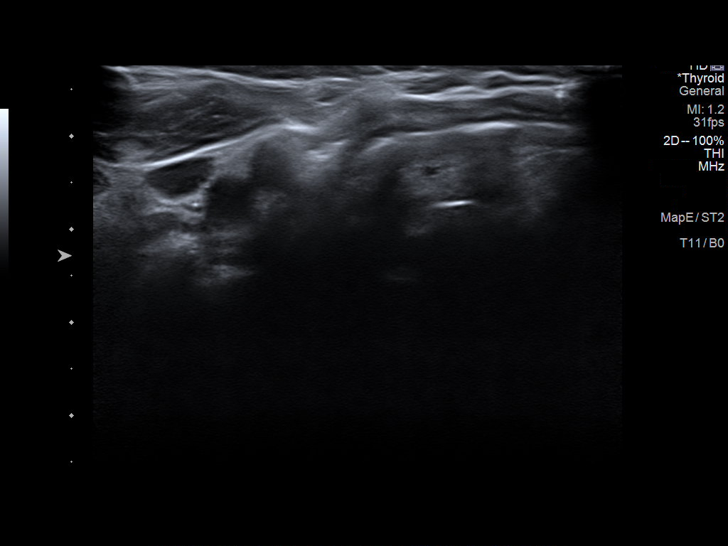
[im 2/15]
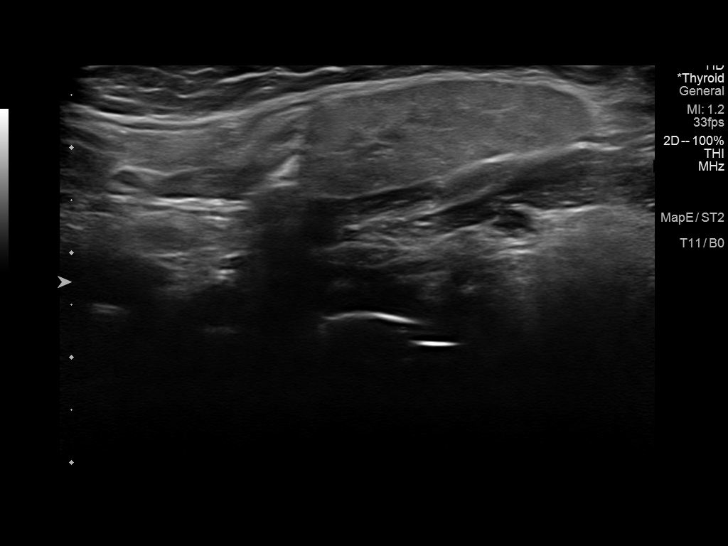
[im 3/15]
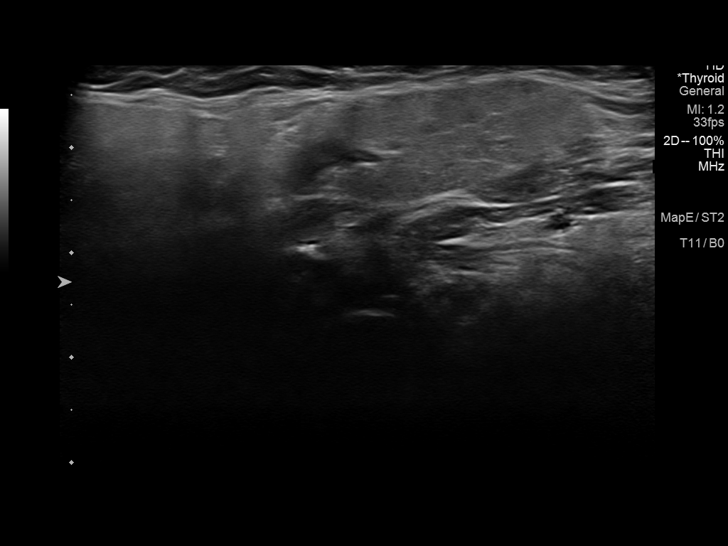
[im 4/15]
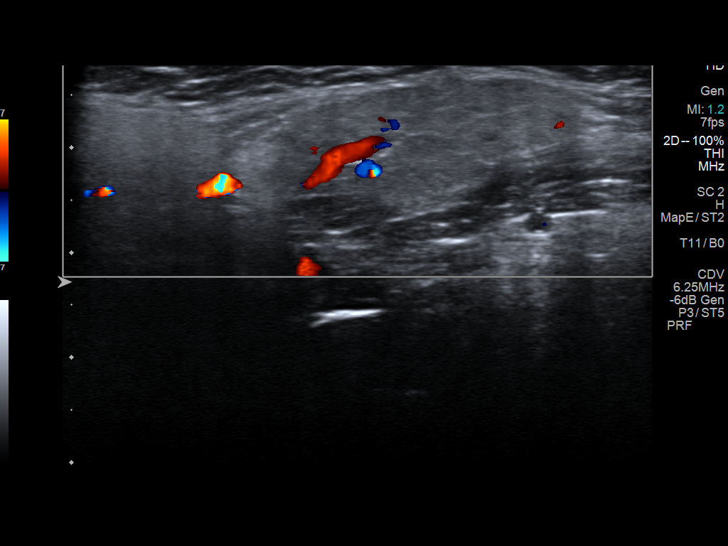
[im 5/15]
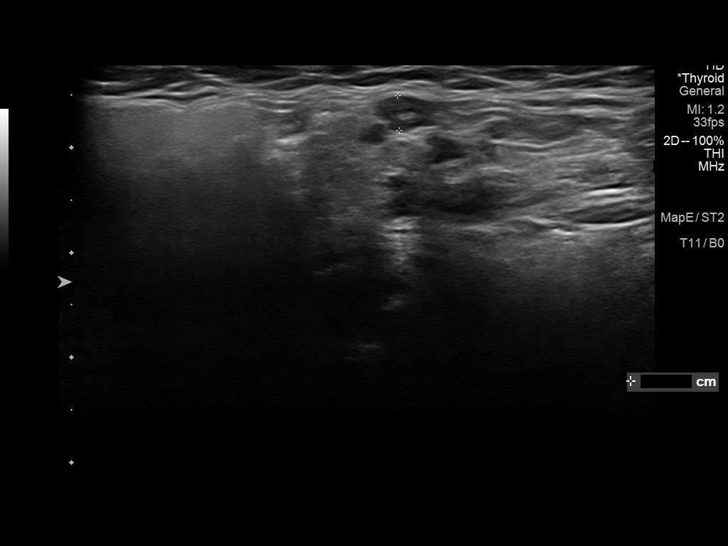
[im 6/15]
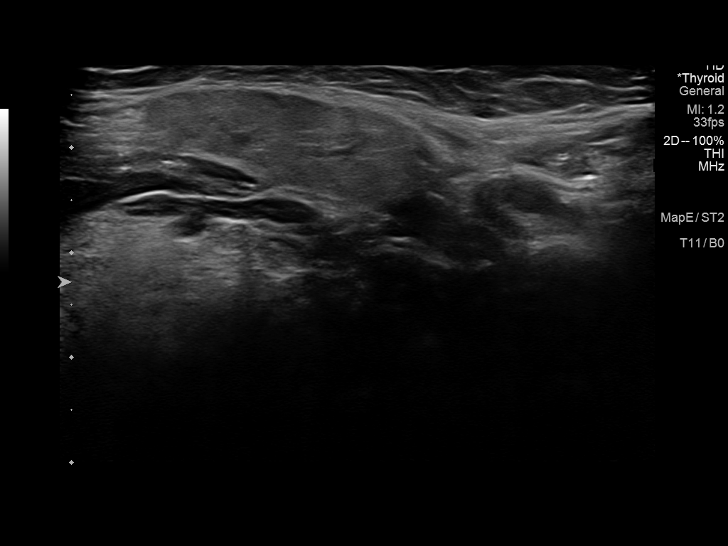
[im 7/15]
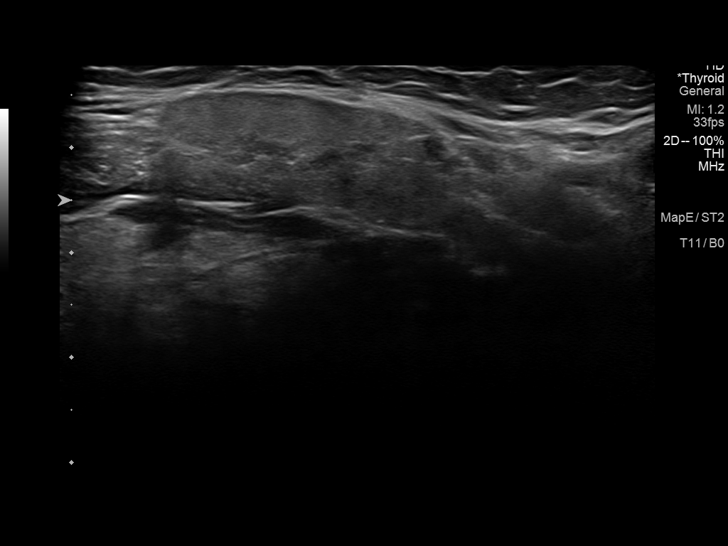
[im 9/15]
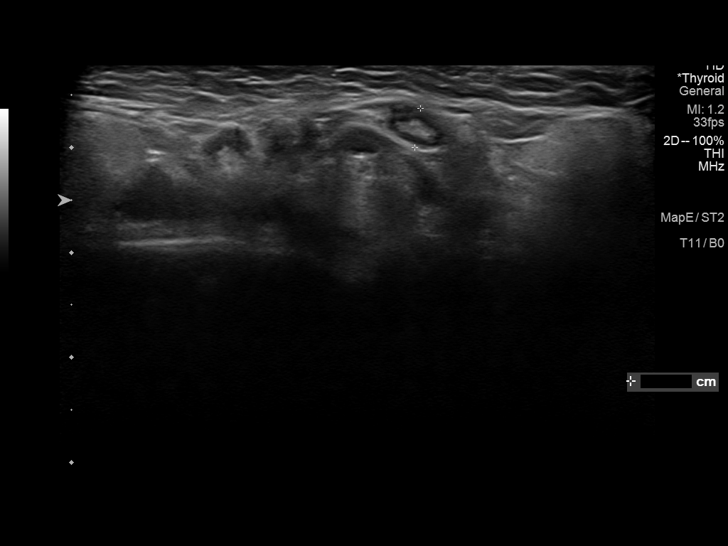
[im 10/15]
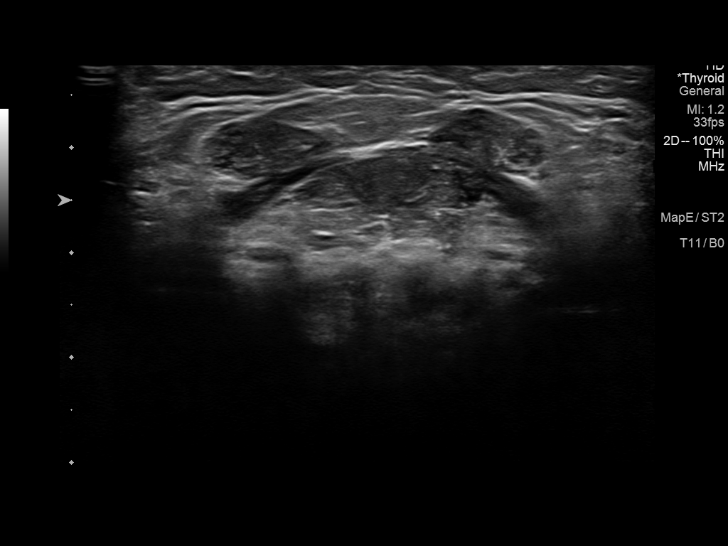
[im 11/15]
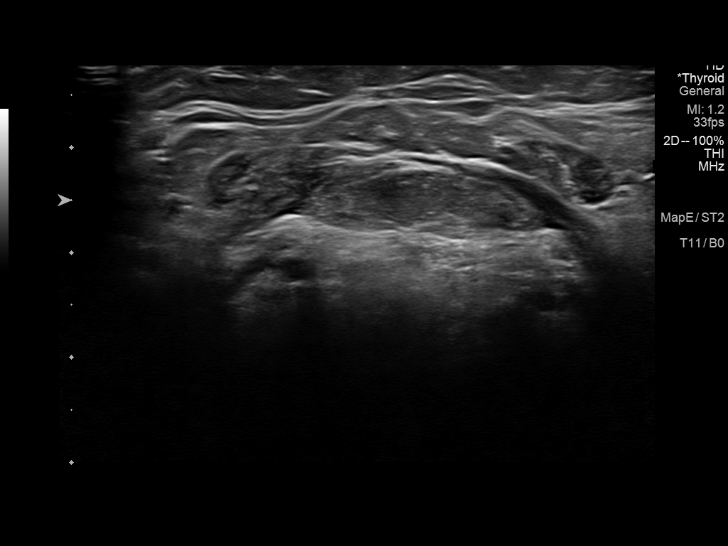
[im 12/15]
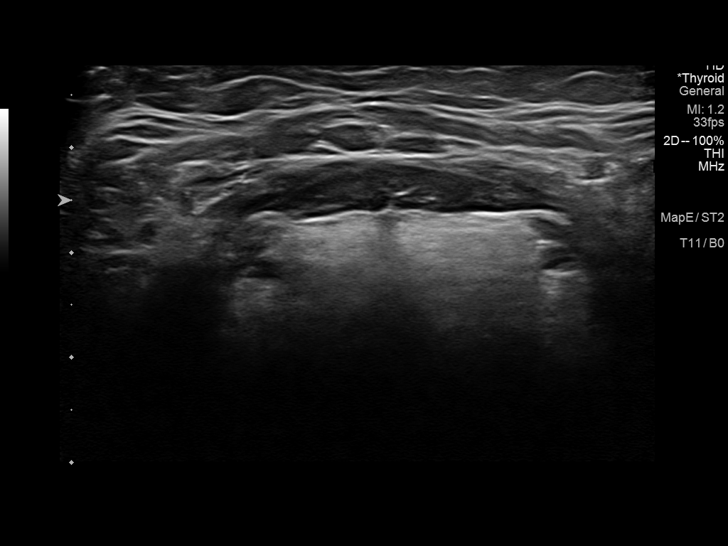
[im 13/15]
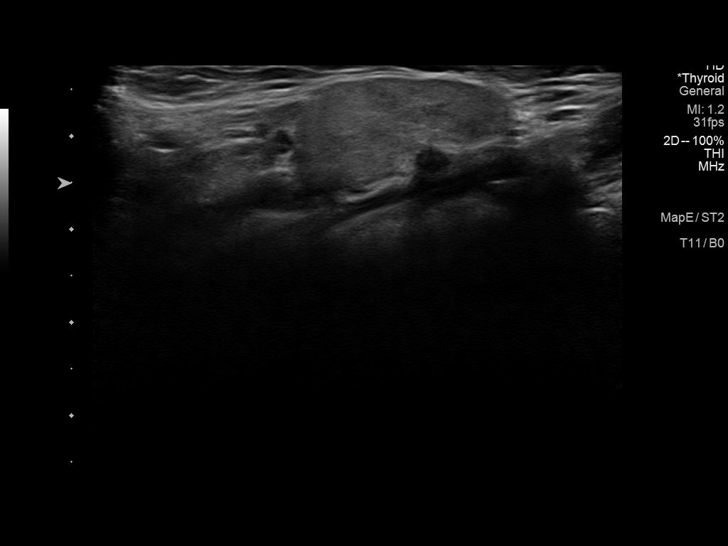
[im 14/15]
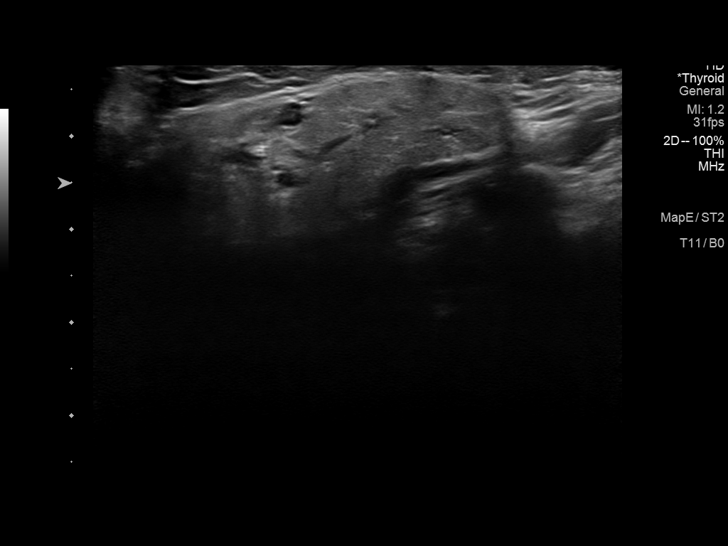
[im 15/15]
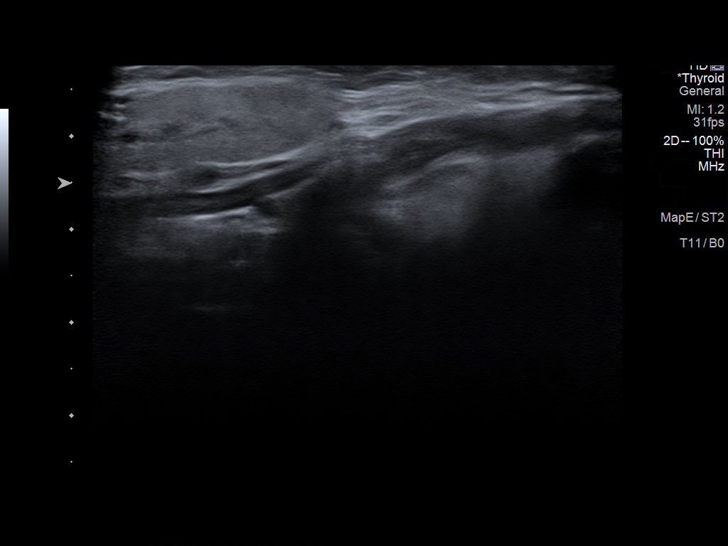

[14 of 15 positions shown; findings below may reference images not displayed]

FINDINGS: The patient's palpable area of concern corresponds to a
morphologically normal, small cervical/submandibular lymph nodes.
There is no concerning mass or pathologically enlarged lymph node.
IMPRESSION: Normal cervical lymph nodes identified. No concerning mass or fluid
collection.

## 2023-03-11 ENCOUNTER — Non-Acute Institutional Stay: Payer: Self-pay | Admitting: Nurse Practitioner

## 2023-03-11 ENCOUNTER — Encounter: Payer: Self-pay | Admitting: Nurse Practitioner

## 2023-03-11 DIAGNOSIS — E039 Hypothyroidism, unspecified: Secondary | ICD-10-CM

## 2023-03-11 DIAGNOSIS — I495 Sick sinus syndrome: Secondary | ICD-10-CM | POA: Diagnosis not present

## 2023-03-11 DIAGNOSIS — N1831 Chronic kidney disease, stage 3a: Secondary | ICD-10-CM

## 2023-03-11 DIAGNOSIS — R200 Anesthesia of skin: Secondary | ICD-10-CM | POA: Diagnosis not present

## 2023-03-11 DIAGNOSIS — E876 Hypokalemia: Secondary | ICD-10-CM | POA: Diagnosis not present

## 2023-03-11 DIAGNOSIS — U071 COVID-19: Secondary | ICD-10-CM | POA: Insufficient documentation

## 2023-03-11 DIAGNOSIS — I509 Heart failure, unspecified: Secondary | ICD-10-CM

## 2023-03-11 DIAGNOSIS — I10 Essential (primary) hypertension: Secondary | ICD-10-CM | POA: Diagnosis not present

## 2023-03-11 DIAGNOSIS — F0390 Unspecified dementia without behavioral disturbance: Secondary | ICD-10-CM | POA: Diagnosis not present

## 2023-03-11 DIAGNOSIS — I4819 Other persistent atrial fibrillation: Secondary | ICD-10-CM | POA: Diagnosis not present

## 2023-03-11 NOTE — Assessment & Plan Note (Signed)
tolerated Namenda well, helped with her mood as well. MMSE 26/30 03/23/22

## 2023-03-11 NOTE — Assessment & Plan Note (Signed)
on Metoprolol, Furosemide, intermittently elevated Sbp, f/u Cardiology.  

## 2023-03-11 NOTE — Assessment & Plan Note (Signed)
taking Levothyroxine, TSH 4.38 01/25/23

## 2023-03-11 NOTE — Assessment & Plan Note (Signed)
nasal congestion, tested positive for COVID, started combivent q6hr prn, Molnupiravir bid x 5days. Denied chest pain, SOB, cough, change of appetite or fatigue. Afebrile, no O2 desaturation.  Tolerated Molnupiravir well, prn Combivent available to her.

## 2023-03-11 NOTE — Assessment & Plan Note (Signed)
Heart rate is in control, on Metoprolol, Flecainide, Eliquis. Hgb 13.6 01/25/23

## 2023-03-11 NOTE — Assessment & Plan Note (Signed)
Chronic numbness of R+L hands, has been using fingerless gloves which helped, declined workup, neurology referral, or medication

## 2023-03-11 NOTE — Progress Notes (Signed)
Location:   [facility] Room Number: 824 Place of Service:  ALF (13) Provider: Arna Snipe Avneet Ashmore NP  Venita Sheffield, MD  Patient Care Team: Venita Sheffield, MD as PCP - General (Internal Medicine) [name], MD (Inactive) as PCP - Electrophysiology (Cardiology) [name], MD as PCP - Cardiology (Cardiology) [name], RN as Case Manager Helaine Chess [name], FNP (Hospice and Palliative Medicine)  Extended Emergency Contact Information Primary Emergency Contact: Pearman,Karen Address: [address]          [place], [place] [address] Darden Amber of [place] Home Phone: [phone] Mobile Phone: [phone] Relation: Daughter Secondary Emergency Contact: Gae Dry Home Phone: [phone] Mobile Phone: [phone] Relation: Daughter Interpreter needed? No  Code Status: DNR Goals of care: Advanced Directive information    01/24/2023    1:32 PM  Advanced Directives  Does Patient Have a Medical Advance Directive? Yes  Type of Estate agent of [place];Out of facility DNR (pink MOST or yellow form)  Does patient want to make changes to medical advance directive? No - Patient declined  Copy of Healthcare Power of Attorney in Chart? Yes - validated most recent copy scanned in chart (See row information)     Chief Complaint  Patient presents with   Acute Visit    Tested COVID, nasal congestion.     HPI:  Pt is a 89 y.o. female seen today for an acute visit for nasal congestion, tested positive for COVID, started combivent q6hr prn, Molnupiravir bid x 5days. Denied chest pain, SOB, cough, change of appetite or fatigue. Afebrile, no O2 desaturation.    01/11/23 family expressed concerning of hand and fingers tingling and numbness, desires checking TSH and K level, ? If Namenda is the culprit. The patient stated her tingling and numbness in hands and fingers has been the same, declined workup or neurology referral.                            Chronic numbness of R+L hands, has been using fingerless gloves which helped, declined workup, neurology referral, or medication              Senile dementia, tolerated Namenda well, helped with her mood as well. MMSE 26/30 03/23/22             Afib, on Metoprolol, Flecainide, Eliquis. Hgb 13.6 01/25/23             AS moderate             HTN on Metoprolol, Furosemide, intermittently elevated Sbp, f/u Cardiology.              Pacemaker, f/u Cardiology             CHF, edema BLE, on Furosemide. EF 70-75% 09/19/21, 03/27/22 Venous US, negative DVT. 03/30/22 arterial US mild to moderate obstructive disease BLE             CKD Bun/creat 13/0.85 01/25/23             Dysphagia, nectar liquid.              Hypothyroidism, taking Levothyroxine, TSH 4.38 01/25/23             OA, takes Tylenol, ambulates with walker.              CT head 02/12/22 Mild age-related atrophy and chronic microvascular ischemic changes.  Hypokalemia, K 4.3 01/25/23  Past Medical History:  Diagnosis Date   Aortic stenosis 03/08/2016   Brady-tachy syndrome (HCC)    a. Biotronik dual chamber PPM implanted 2009 b. gen change to STJ dual chamber PPM 2017   Ejection fraction    EF 70%, echo, October, 2009  //   EF 55-60%, septal dyssynergy consistent with a paced rhythm, mild to moderate mitral regurgitation, echo, November, 2015    GERD (gastroesophageal reflux disease) 04/28/2014   Episodes November, 2015 with fluid refluxing from her esophagus.    Hair loss    Patient questioned Coumadin, changed toPradaxa   Hypertension    Hypothyroidism    Lower extremity edema 01/22/2018   Mitral regurgitation 05/03/2014   Mild-to-moderate mitral regurgitation, echo, November, 2015    Osteoarthritis of left knee 10/25/2017   Paroxysmal atrial fibrillation (HCC)    Episodes rapid atrial fib noted by pacemaker interrogation, August, 2011, diltiazem added, patient improved. Patient continues on Rythmol  //   Changed  to flecainide 2013  //   flecainide level checked in 2013, good level    Persistent atrial fibrillation (HCC)    Past Surgical History:  Procedure Laterality Date   ABDOMINAL HYSTERECTOMY     EP IMPLANTABLE DEVICE N/A 08/08/2015   Generator change with SJM Assurity DR PPM by Dr Johney Frame   JOINT REPLACEMENT     PACEMAKER PLACEMENT  2009             Review of Systems  Constitutional:  Negative for appetite change, fatigue and fever.  HENT:  Positive for congestion, hearing loss, rhinorrhea and trouble swallowing. Negative for sinus pressure and sore throat.   Eyes:  Negative for visual disturbance.  Respiratory:  Negative for cough, chest tightness, shortness of breath and wheezing.   Cardiovascular:  Positive for leg swelling. Negative for chest pain and palpitations.  Gastrointestinal:  Negative for abdominal pain and constipation.  Genitourinary:  Negative for dysuria and frequency.  Musculoskeletal:  Positive for arthralgias and gait problem.       Knees, L>R, intermittent in nature.   Skin:  Negative for color change.       Chronic venous insufficiency skin changes: mild erythema in mid of R+L   Neurological:  Positive for numbness. Negative for tremors, speech difficulty, weakness and headaches.       Memory lapses. chronic numbness in fingers.   Psychiatric/Behavioral:  Negative for confusion and sleep disturbance. The patient is not nervous/anxious.     Immunization History  Administered Date(s) Administered   Fluad Quad(high Dose 65+) 04/02/2022   Influenza Split 03/11/2009, 03/11/2010, 02/25/2011, 02/24/2019, 03/08/2020, 03/25/2021   Influenza, High Dose Seasonal PF 03/08/2015, 04/03/2017, 03/17/2018, 03/08/2020   Influenza,inj,Quad PF,6+ Mos 03/08/2016   Influenza,inj,quad, With Preservative 03/11/2017   Moderna Covid-19 Vaccine Bivalent Booster 4yrs & up 10/27/2021   Moderna SARS-COV2 Booster Vaccination 11/08/2020   Moderna Sars-Covid-2 Vaccination 06/15/2019,  07/13/2019, 04/19/2020, 02/27/2022   Pfizer Covid-19 Vaccine Bivalent Booster 28yrs & up 02/28/2021, 04/12/2022   Pneumococcal Conjugate-13 12/06/2014   Pneumococcal Polysaccharide-23 05/13/2001, 03/11/2009   Polio, Unspecified 02/02/1958   Tdap 11/21/2011   Zoster Recombinant(Shingrix) 07/18/2022, 10/19/2022   Pertinent  Health Maintenance Due  Topic Date Due   INFLUENZA VACCINE  01/10/2023   DEXA SCAN  Completed      02/16/2022   12:00 AM 02/16/2022    9:45 AM 02/27/2022   11:41 AM 07/06/2022   10:04 AM 07/13/2022    3:55 PM  Fall Risk  Falls  in the past year?    0 0  Was there an injury with Fall?    0 0  Fall Risk Category Calculator    0 0  (RETIRED) Patient Fall Risk Level High fall risk High fall risk High fall risk    Patient at Risk for Falls Due to   History of fall(s) No Fall Risks No Fall Risks  Fall risk Follow up   Falls evaluation completed  Falls evaluation completed   Functional Status Survey:    Vitals:   03/11/23 1207  BP: 138/78  Pulse: 67  Resp: 20  Temp: 98.5 F (36.9 C)  SpO2: 93%  Weight: 204 lb 9.6 oz (92.8 kg)   Body mass index is 33.02 kg/m. Physical Exam Vitals and nursing note reviewed.  Constitutional:      Appearance: Normal appearance.  HENT:     Head: Normocephalic and atraumatic.     Nose: Congestion and rhinorrhea present.     Mouth/Throat:     Mouth: Mucous membranes are moist.     Pharynx: No oropharyngeal exudate or posterior oropharyngeal erythema.  Eyes:     Extraocular Movements: Extraocular movements intact.     Conjunctiva/sclera: Conjunctivae normal.     Pupils: Pupils are equal, round, and reactive to light.  Cardiovascular:     Rate and Rhythm: Normal rate. Rhythm irregular.     Heart sounds: Murmur heard.     Comments: No dorsalis pedis pulses felt from previous examination.  Pulmonary:     Effort: Pulmonary effort is normal.     Breath sounds: No wheezing or rales.  Chest:     Chest wall: No tenderness.   Abdominal:     General: Bowel sounds are normal.     Palpations: Abdomen is soft.     Tenderness: There is no abdominal tenderness.  Musculoskeletal:        General: Normal range of motion.     Cervical back: Normal range of motion and neck supple.     Right lower leg: Edema present.     Left lower leg: Edema present.     Comments: Trace -1+ edema LLE>RLE  Skin:    General: Skin is warm and dry.     Findings: Erythema present.     Comments: Mild dark erythema mid of R+L lower legs, chronic.     Neurological:     General: No focal deficit present.     Mental Status: She is alert and oriented to person, place, and time. Mental status is at baseline.     Gait: Gait abnormal.     Comments: 5/5 muscle strength in fingers.   Psychiatric:        Mood and Affect: Mood normal.        Behavior: Behavior normal.     Labs reviewed: Recent Labs    03/27/22 0000 06/28/22 0000 07/05/22 0000  NA 142 141 140  K 4.1 4.4 4.0  CL 106 104 104  CO2 30* 30* 28*  BUN 19 20 21   CREATININE 0.8 0.9 0.9  CALCIUM 9.3 9.5 9.2   Recent Labs    03/27/22 0000 06/28/22 0000 07/05/22 0000  AST 28 29 23   ALT 21 23 18   ALKPHOS 53 46 44  ALBUMIN 3.8 3.9 3.8   Recent Labs    03/27/22 0000 06/25/22 0000  WBC 4.9 4.9  NEUTROABS 2,386.00 2,279.00  HGB 13.7 14.6  HCT 42 44  PLT 151 153   Lab Results  Component Value Date   TSH 4.35 06/25/2022   No results found for: "HGBA1C" Lab Results  Component Value Date   CHOL 193 05/13/2017   HDL 41 05/13/2017   LDLCALC 122 (H) 05/13/2017   TRIG 152 (H) 05/13/2017   CHOLHDL 4.7 (H) 05/13/2017    Significant Diagnostic Results in last 30 days:  No results found.  Assessment/Plan: COVID-19 virus infection nasal congestion, tested positive for COVID, started combivent q6hr prn, Molnupiravir bid x 5days. Denied chest pain, SOB, cough, change of appetite or fatigue. Afebrile, no O2 desaturation.  Tolerated Molnupiravir well, prn Combivent  available to her.   Numbness of fingers of both hands Chronic numbness of R+L hands, has been using fingerless gloves which helped, declined workup, neurology referral, or medication  Senile dementia (HCC)  tolerated Namenda well, helped with her mood as well. MMSE 26/30 03/23/22  Persistent atrial fibrillation (HCC) Heart rate is in control, on Metoprolol, Flecainide, Eliquis. Hgb 13.6 01/25/23  Hypertension on Metoprolol, Furosemide, intermittently elevated Sbp, f/u Cardiology.   Brady-tachy syndrome [facility]) Pacemaker, f/u cardiology.   Congestive heart failure (HCC) Compensated, chronic edema BLE, on Furosemide. EF 70-75% 09/19/21, 03/27/22 Venous US, negative DVT. 03/30/22 arterial US mild to moderate obstructive disease BLE  CKD (chronic kidney disease) stage 3, GFR 30-59 ml/min (HCC) Bun/creat 13/0.85 01/25/23  Hypothyroidism  taking Levothyroxine, TSH 4.38 01/25/23  Hypokalemia, inadequate intake K 4.3 01/25/23    Family/ staff Communication: plan of care reviewed with the patient and charge nurse.   Labs/tests ordered:  none  Time spend 40 minutes.

## 2023-03-11 NOTE — Assessment & Plan Note (Signed)
K 4.3 01/25/23

## 2023-03-11 NOTE — Assessment & Plan Note (Signed)
Pace maker, f/u cardiology

## 2023-03-11 NOTE — Assessment & Plan Note (Signed)
Compensated, chronic edema BLE, on Furosemide. EF 70-75% 09/19/21, 03/27/22 Venous US, negative DVT. 03/30/22 arterial US mild to moderate obstructive disease BLE

## 2023-03-11 NOTE — Assessment & Plan Note (Signed)
Bun/creat 13/0.85 01/25/23

## 2023-03-15 ENCOUNTER — Other Ambulatory Visit: Payer: Self-pay | Admitting: Family Medicine

## 2023-03-15 DIAGNOSIS — I480 Paroxysmal atrial fibrillation: Secondary | ICD-10-CM

## 2023-03-15 NOTE — Telephone Encounter (Signed)
High Risk Warning Populated when attempting to refill, I will send to Provider for further review 

## 2023-03-17 ENCOUNTER — Telehealth: Payer: Medicare Other | Admitting: Student

## 2023-03-17 DIAGNOSIS — U071 COVID-19: Secondary | ICD-10-CM

## 2023-03-17 MED ORDER — GUAIFENESIN ER 600 MG PO TB12
600.0000 mg | ORAL_TABLET | Freq: Two times a day (BID) | ORAL | 0 refills | Status: DC
Start: 2023-03-17 — End: 2023-03-22

## 2023-03-17 NOTE — Telephone Encounter (Signed)
Patient's family requesting something for non-productive cough. Completed paxlovid. May need f/u as well.

## 2023-03-18 ENCOUNTER — Telehealth: Payer: Self-pay

## 2023-03-18 MED ORDER — BENZONATATE 100 MG PO CAPS: 200.0000 mg | ORAL_CAPSULE | Freq: Three times a day (TID) | ORAL | 0 refills | Status: DC | PRN

## 2023-03-18 NOTE — Telephone Encounter (Signed)
Open in error

## 2023-03-18 NOTE — Addendum Note (Signed)
Addended by: Venita Sheffield on: 03/18/2023 03:20 PM   Modules accepted: Orders

## 2023-03-18 NOTE — Telephone Encounter (Signed)
Medication refill still pending for provider approval.

## 2023-03-18 NOTE — Telephone Encounter (Signed)
Spoke with patient daughter this afternoon and she said that she has been taking the medication furosemide without any problems.   Message routed back to Venita Sheffield, MD

## 2023-03-19 NOTE — Telephone Encounter (Signed)
Mychart message sent to patient/family member

## 2023-03-22 ENCOUNTER — Encounter: Payer: Self-pay | Admitting: Sports Medicine

## 2023-03-22 ENCOUNTER — Non-Acute Institutional Stay (SKILLED_NURSING_FACILITY): Payer: Medicare Other | Admitting: Sports Medicine

## 2023-03-22 DIAGNOSIS — R051 Acute cough: Secondary | ICD-10-CM

## 2023-03-22 DIAGNOSIS — I10 Essential (primary) hypertension: Secondary | ICD-10-CM | POA: Diagnosis not present

## 2023-03-22 DIAGNOSIS — J9809 Other diseases of bronchus, not elsewhere classified: Secondary | ICD-10-CM | POA: Diagnosis not present

## 2023-03-22 DIAGNOSIS — I517 Cardiomegaly: Secondary | ICD-10-CM | POA: Diagnosis not present

## 2023-03-22 DIAGNOSIS — R300 Dysuria: Secondary | ICD-10-CM | POA: Diagnosis not present

## 2023-03-22 NOTE — Progress Notes (Signed)
Location:   [facility]  Nursing Home Room Number: 824-A Place of Service:  ALF [phone]) Provider:  Oletta Lamas    Patient Care Team: Venita Sheffield, MD as PCP - General (Internal Medicine) [name], MD (Inactive) as PCP - Electrophysiology (Cardiology) [name], MD as PCP - Cardiology (Cardiology) Lurline Idol, FNP [facility] and Palliative Medicine)  Extended Emergency Contact Information Primary Emergency Contact: Pearman,Karen Address: [address]          [place], [place] [address] D[place] of [place] Home Phone: [phone] Mobile Phone: [phone] Relation: Daughter Secondary Emergency Contact: Gae Dry Home Phone: [phone] Mobile Phone: [phone] Relation: Daughter Interpreter needed? No  Code Status:  DNR Goals of care: Advanced Directive information    03/22/2023    3:26 PM  Advanced Directives  Does Patient Have a Medical Advance Directive? Yes  Type of Estate agent of [place];Living will  Does patient want to make changes to medical advance directive? No - Patient declined  Copy of Healthcare Power of Attorney in Chart? Yes - validated most recent copy scanned in chart (See row information)     Chief Complaint  Patient presents with   Acute Visit    Cough & Congestion    HPI:  Pt is a 89 y.o. female seen today for an acute visit for cough and congestion  Staff reported that pt had recent covid and is off the isolation  Pt is having dry cough  C/o mild exertional SOB  No wheezing  Denies fevers.  As per nursing staff pt is confused since couple of days . She knows her name , not oriented to time or place. She could not remember what she had for lunch . At baseline pt is able to walk to dining room with the help of her walker but this afternoon staff had to assist her going to dining room    Past Medical History:  Diagnosis Date   Aortic stenosis 03/08/2016    Brady-tachy syndrome (HCC)    a. Biotronik dual chamber PPM implanted 2009 b. gen change to STJ dual chamber PPM 2017   Ejection fraction    EF 70%, echo, October, 2009  //   EF 55-60%, septal dyssynergy consistent with a paced rhythm, mild to moderate mitral regurgitation, echo, November, 2015    GERD (gastroesophageal reflux disease) 04/28/2014   Episodes November, 2015 with fluid refluxing from her esophagus.    Hair loss    Patient questioned Coumadin, changed toPradaxa   Hypertension    Hypothyroidism    Lower extremity edema 01/22/2018   Mitral regurgitation 05/03/2014   Mild-to-moderate mitral regurgitation, echo, November, 2015    Osteoarthritis of left knee 10/25/2017   Paroxysmal atrial fibrillation (HCC)    Episodes rapid atrial fib noted by pacemaker interrogation, August, 2011, diltiazem added, patient improved. Patient continues on Rythmol  //   Changed to flecainide 2013  //   flecainide level checked in 2013, good level    Persistent atrial fibrillation (HCC)    Past Surgical History:  Procedure Laterality Date   ABDOMINAL HYSTERECTOMY     EP IMPLANTABLE DEVICE N/A 08/08/2015   Generator change with SJM Assurity DR PPM by Dr Johney Frame   JOINT REPLACEMENT     PACEMAKER PLACEMENT  2009        Allergies  Allergen Reactions   Morphine And Codeine Other (See Comments)    Patient states that she went "crazy" with this   Penicillins Swelling  and Rash        Lasix [Furosemide] Itching    Patient stated it made her itch all over and dry.   Aspirin     Pacemaker   Clindamycin/Lincomycin Other (See Comments)    Any meds with mycin in the name Unknown reaction   Crestor [Rosuvastatin]     Other reaction(s): muscle pain   Diltiazem     Skin discoloration, lower extremity swelling   Erythromycin Other (See Comments)    Any meds with mycin in the name Unknown reaction   Lisinopril Other (See Comments)    REACTION: Cough   Metronidazole     Other reaction(s): mouth sores,  tongue swelling   Morphine    Penicillins Cross Reactors Other (See Comments)    Any meds with mycin in the name Unknown reaction    Allergies as of 03/22/2023       Reactions                  Lasix [furosemide] Itching   Patient stated it made her itch all over and dry.   Aspirin    Pacemaker   Clindamycin/lincomycin Other (See Comments)   Any meds with mycin in the name Unknown reaction   Crestor [rosuvastatin]    Other reaction(s): muscle pain   Diltiazem    Skin discoloration, lower extremity swelling   Erythromycin Other (See Comments)   Any meds with mycin in the name Unknown reaction   Lisinopril Other (See Comments)   REACTION: Cough   Metronidazole    Other reaction(s): mouth sores, tongue swelling   Morphine    Penicillins Cross Reactors Other (See Comments)   Any meds with mycin in the name Unknown reaction        Medication List        Accurate as of March 22, 2023  3:27 PM. If you have any questions, ask your nurse or doctor.          STOP taking these medications    benzonatate 100 MG capsule Commonly known as: Lawyer Stopped by: Venita Sheffield   doxycycline 100 MG tablet Commonly known as: ADOXA Stopped by: Randall Rampersad   guaiFENesin 600 MG 12 hr tablet Commonly known as: Mucinex Stopped by: Venita Sheffield       TAKE these medications    acetaminophen 500 MG tablet Commonly known as: TYLENOL Take 1,000 mg by mouth in the morning and at bedtime.   acetaminophen 325 MG tablet Commonly known as: Tylenol Take 2 tablets (650 mg total) by mouth every 6 (six) hours as needed.   betamethasone (augmented) 0.05 % gel Commonly known as: DIPROLENE Apply topically daily.  Apply to scalp topically every 12 hours as needed for psoriasis related to PSORIASIS, UNSPECIFIED (L40.9) apply to affected area of scalp   Calcium 600+D Plus Minerals 600-400 MG-UNIT Tabs Take 1 tablet by mouth daily.   diclofenac  Sodium 1 % Gel Commonly known as: VOLTAREN Apply 2 g topically 4 (four) times daily.   Eliquis 5 MG Tabs tablet Generic drug: apixaban TAKE ONE TABLET BY MOUTH TWICE A DAY   flecainide 50 MG tablet Commonly known as: TAMBOCOR Take 1 tablet (50 mg total) by mouth 2 (two) times daily.   furosemide 20 MG tablet Commonly known as: LASIX TAKE ONE TABLET BY MOUTH EVERY DAY   hydrocortisone 2.5 % ointment Apply to affected areas topically as needed for Rashy/itchy areas two times daily-MAY SELF ADMINISTER-per pt request   Ipratropium-Albuterol  20-100 MCG/ACT Aers respimat Commonly known as: COMBIVENT Inhale 1 puff into the lungs every 6 (six) hours.   levothyroxine 100 MCG tablet Commonly known as: SYNTHROID Take 1 tablet (100 mcg total) by mouth every morning.   memantine 10 MG tablet Commonly known as: NAMENDA Take 1 tablet (10 mg total) by mouth 2 (two) times daily. for cognitive decline   metoprolol succinate 25 MG 24 hr tablet Commonly known as: TOPROL-XL Take 3 tablets (75 mg total) by mouth daily.   Multivitamin Adult Tabs Take 1 tablet by mouth daily.   OCUVITE PO Take 1 capsule by mouth daily.   potassium chloride 10 MEQ tablet Commonly known as: KLOR-CON M Take 1 tablet (10 mEq total) by mouth daily.   Prevagen 10 MG Caps Generic drug: Apoaequorin Take 10 mg by mouth daily.   Refresh Relieva PF 0.5-1 % Soln Generic drug: Carboxymethylcell-Glycerin PF Apply 1 drop to eye in the morning and at bedtime. for relieve discomfort   sodium chloride 5 % ophthalmic ointment Commonly known as: MURO 128 Place 1 Application into both eyes at bedtime.   sodium chloride 5 % ophthalmic solution Commonly known as: Muro 128 Place 1 drop into both eyes in the morning and at bedtime. for decrease swelling Wait 1 hour between these and Refresh        Review of Systems  Constitutional:  Negative for chills and fever.  HENT:  Negative for sinus pressure and sore throat.    Respiratory:  Positive for cough and shortness of breath. Negative for wheezing.   Cardiovascular:  Negative for chest pain, palpitations and leg swelling.  Gastrointestinal:  Negative for abdominal distention, abdominal pain, blood in stool, constipation, diarrhea, nausea and vomiting.  Genitourinary:  Negative for dysuria, frequency and urgency.  Neurological:  Negative for dizziness, weakness and numbness.  Psychiatric/Behavioral:  Negative for confusion.     Immunization History  Administered Date(s) Administered   Fluad Quad(high Dose 65+) 04/02/2022   Influenza Split 03/11/2009, 03/11/2010, 02/25/2011, 02/24/2019, 03/08/2020, 03/25/2021   Influenza, High Dose Seasonal PF 03/08/2015, 04/03/2017, 03/17/2018, 03/08/2020   Influenza,inj,Quad PF,6+ Mos 03/08/2016   Influenza,inj,quad, With Preservative 03/11/2017   Moderna Covid-19 Vaccine Bivalent Booster 56yrs & up 10/27/2021   Moderna SARS-COV2 Booster Vaccination 11/08/2020   Moderna Sars-Covid-2 Vaccination 06/15/2019, 07/13/2019, 04/19/2020, 02/27/2022   Pfizer Covid-19 Vaccine Bivalent Booster 48yrs & up 02/28/2021, 04/12/2022   Pneumococcal Conjugate-13 12/06/2014   Pneumococcal Polysaccharide-23 05/13/2001, 03/11/2009   Polio, Unspecified 02/02/1958   Tdap 11/21/2011   Zoster Recombinant(Shingrix) 07/18/2022, 10/19/2022   Pertinent  Health Maintenance Due  Topic Date Due   INFLUENZA VACCINE  01/10/2023   DEXA SCAN  Completed      02/16/2022   12:00 AM 02/16/2022    9:45 AM 02/27/2022   11:41 AM 07/06/2022   10:04 AM 07/13/2022    3:55 PM  Fall Risk  Falls in the past year?    0 0  Was there an injury with Fall?    0 0  Fall Risk Category Calculator    0 0  (RETIRED) Patient Fall Risk Level High fall risk High fall risk High fall risk    Patient at Risk for Falls Due to   History of fall(s) No Fall Risks No Fall Risks  Fall risk Follow up   Falls evaluation completed  Falls evaluation completed   Functional Status  Survey:    Vitals:   03/22/23 1512  BP: (!) 154/65  Pulse: 64  Resp: 17  Temp: 98.1 F (36.7 C)  SpO2: 94%  Weight: 205 lb 12.8 oz (93.4 kg)  Height: 5\' 6"  (1.676 m)   Body mass index is 33.22 kg/m. Physical Exam Constitutional:      Appearance: Normal appearance.  HENT:     Head: Normocephalic and atraumatic.  Cardiovascular:     Rate and Rhythm: Normal rate and regular rhythm.     Heart sounds: No murmur heard. Pulmonary:     Effort: Pulmonary effort is normal.     Breath sounds: No wheezing or rales.     Comments: Coarse breath sounds  Abdominal:     General: Bowel sounds are normal. There is no distension.     Tenderness: There is no abdominal tenderness. There is no guarding or rebound.  Musculoskeletal:        General: No swelling or tenderness.  Skin:    General: Skin is dry.  Neurological:     Mental Status: She is alert. Mental status is at baseline.     Sensory: No sensory deficit.     Motor: No weakness.     Labs reviewed: Recent Labs    07/05/22 0000 01/18/23 0000 01/26/23 0000  NA 140 140 139  K 4.0 3.7 4.3  CL 104 104 103  CO2 28* 28* 24*  BUN 21 16 13   CREATININE 0.9 0.8 0.9  CALCIUM 9.2 9.1 9.6   Recent Labs    07/05/22 0000 01/18/23 0000 01/26/23 0000  AST 23 23 24   ALT 18 16 17   ALKPHOS 44 37 40  ALBUMIN 3.8 3.7 3.7   Recent Labs    06/25/22 0000 01/18/23 0000 01/26/23 0000  WBC 4.9 5.2 4.6  NEUTROABS 2,279.00 2,782.00 1,697.00  HGB 14.6 13.4 13.6  HCT 44 41 41  PLT 153 113* 121*   Lab Results  Component Value Date   TSH 4.38 01/26/2023   No results found for: "HGBA1C" Lab Results  Component Value Date   CHOL 193 05/13/2017   HDL 41 05/13/2017   LDLCALC 122 (H) 05/13/2017   TRIG 152 (H) 05/13/2017   CHOLHDL 4.7 (H) 05/13/2017    Significant Diagnostic Results in last 30 days:  No results found.  Assessment/Plan  Cough  Pt noted with dry cough and mild exertional SOB  No wheezing on exam  Will get chest  x Tibbits  Will order cbc, bmp  Will order tessalon 200mg     Family/ staff Communication: care plan discussed with the nursing staff  Labs/tests ordered:  chest x Kiernan, cbc, bmp

## 2023-03-25 ENCOUNTER — Encounter: Payer: Self-pay | Admitting: Orthopedic Surgery

## 2023-03-25 ENCOUNTER — Encounter: Payer: Self-pay | Admitting: Sports Medicine

## 2023-03-25 ENCOUNTER — Non-Acute Institutional Stay: Payer: Self-pay | Admitting: Orthopedic Surgery

## 2023-03-25 DIAGNOSIS — N3000 Acute cystitis without hematuria: Secondary | ICD-10-CM

## 2023-03-25 DIAGNOSIS — R051 Acute cough: Secondary | ICD-10-CM | POA: Diagnosis not present

## 2023-03-25 LAB — BASIC METABOLIC PANEL
BUN: 16 (ref 4–21)
CO2: 28 — AB (ref 13–22)
Chloride: 101 (ref 99–108)
Creatinine: 0.9 (ref 0.5–1.1)
Glucose: 105
Potassium: 4.4 meq/L (ref 3.5–5.1)
Sodium: 137 (ref 137–147)

## 2023-03-25 LAB — CBC AND DIFFERENTIAL
HCT: 43 (ref 36–46)
Hemoglobin: 14 (ref 12.0–16.0)
Neutrophils Absolute: 7700
Platelets: 162 10*3/uL (ref 150–400)
WBC: 10.8

## 2023-03-25 LAB — COMPREHENSIVE METABOLIC PANEL
Calcium: 9.2 (ref 8.7–10.7)
eGFR: 62

## 2023-03-25 LAB — CBC: RBC: 4.3 (ref 3.87–5.11)

## 2023-03-25 NOTE — Progress Notes (Signed)
Location:   [facility]  Nursing Home Room Number: 824-A Place of Service:  ALF [phone]) Provider:  Hazle Nordmann, NP  PCP: Venita Sheffield, MD  Patient Care Team: Venita Sheffield, MD as PCP - General (Internal Medicine) [name], MD (Inactive) as PCP - Electrophysiology (Cardiology) [name], MD as PCP - Cardiology (Cardiology) Lurline Idol, FNP [facility] and Palliative Medicine)  Extended Emergency Contact Information Primary Emergency Contact: Pearman,Karen Address: [address]          [place], [place] [address] Darden [place] of [place] Home Phone: [phone] Mobile Phone: [phone] Relation: Daughter Secondary Emergency Contact: Gae Dry Home Phone: [phone] Mobile Phone: [phone] Relation: Daughter Interpreter needed? No  Code Status:  DNR Goals of care: Advanced Directive information    03/25/2023   11:00 AM  Advanced Directives  Does Patient Have a Medical Advance Directive? Yes  Type of Estate agent of [place];Out of facility DNR (pink MOST or yellow form)  Does patient want to make changes to medical advance directive? No - Patient declined  Copy of Healthcare Power of Attorney in Chart? Yes - validated most recent copy scanned in chart (See row information)     Chief Complaint  Patient presents with   Acute Visit    Dysuria     HPI:  Pt is a 89 y.o. female seen today for acute visit due to urinary frequency.   She currently resides on the assisted living unit at [facility]. PMH: aortic stenosis, aortic valve disorder, brady-tachy syndrome s/p pacemaker, CHF, HTN, PAD, PAF, dysphagia, GERD, hypothyroidism, dementia, OA, CKD, urinary incontinence, and unstable gait.   Family present during encounter.   10/11 she reported urinary frequency. Admits to not drinking water well. Urine culture reports >100,000 cfu/mL citrobacter freundii. She continues to have urinary frequency  today. She is drinking more water. Nursing noted mild confusion yesterday. Appears appropriate during our encounter. Denies fever, fatigue, flank pain.   09/30 she tested positive for covid. 10/11 she continued to have acute cough. CXR suggestive of mild venous pulmonary hypertension, no infiltrates,effusion or pneumothorax noted. Cbc/diff and bmp unremarkable. She was prescribed tessalon perals last week. She believes they are helping. She denies fever, chest pain or shortness of breath.    Past Medical History:  Diagnosis Date   Aortic stenosis 03/08/2016   Brady-tachy syndrome (HCC)    a. Biotronik dual chamber PPM implanted 2009 b. gen change to STJ dual chamber PPM 2017   Ejection fraction    EF 70%, echo, October, 2009  //   EF 55-60%, septal dyssynergy consistent with a paced rhythm, mild to moderate mitral regurgitation, echo, November, 2015    GERD (gastroesophageal reflux disease) 04/28/2014   Episodes November, 2015 with fluid refluxing from her esophagus.    Hair loss    Patient questioned Coumadin, changed toPradaxa   Hypertension    Hypothyroidism    Lower extremity edema 01/22/2018   Mitral regurgitation 05/03/2014   Mild-to-moderate mitral regurgitation, echo, November, 2015    Osteoarthritis of left knee 10/25/2017   Paroxysmal atrial fibrillation (HCC)    Episodes rapid atrial fib noted by pacemaker interrogation, August, 2011, diltiazem added, patient improved. Patient continues on Rythmol  //   Changed to flecainide 2013  //   flecainide level checked in 2013, good level    Persistent atrial fibrillation (HCC)    Past Surgical History:  Procedure Laterality Date   ABDOMINAL HYSTERECTOMY     EP IMPLANTABLE DEVICE  N/A 08/08/2015   Generator change with SJM Assurity DR PPM by Dr Johney Frame   JOINT REPLACEMENT     PACEMAKER PLACEMENT  2009             Review of Systems  Constitutional:  Negative for activity change and appetite change.  HENT:  Positive for  congestion. Negative for sinus pain and trouble swallowing.   Eyes:  Negative for visual disturbance.  Respiratory:  Positive for cough. Negative for shortness of breath and wheezing.   Cardiovascular:  Positive for leg swelling. Negative for chest pain.  Gastrointestinal:  Negative for nausea and vomiting.  Genitourinary:  Positive for frequency. Negative for dysuria and hematuria.  Musculoskeletal:  Positive for gait problem.  Skin:  Negative for wound.  Neurological:  Negative for dizziness, weakness and headaches.  Psychiatric/Behavioral:  Positive for confusion. Negative for dysphoric mood. The patient is not nervous/anxious.     Immunization History  Administered Date(s) Administered   Fluad Quad(high Dose 65+) 04/02/2022   Influenza Split 03/11/2009, 03/11/2010, 02/25/2011, 02/24/2019, 03/08/2020, 03/25/2021   Influenza, High Dose Seasonal PF 03/08/2015, 04/03/2017, 03/17/2018, 03/08/2020   Influenza,inj,Quad PF,6+ Mos 03/08/2016   Influenza,inj,quad, With Preservative 03/11/2017   Moderna Covid-19 Vaccine Bivalent Booster 57yrs & up 10/27/2021   Moderna SARS-COV2 Booster Vaccination 11/08/2020   Moderna Sars-Covid-2 Vaccination 06/15/2019, 07/13/2019, 04/19/2020, 02/27/2022   Pfizer Covid-19 Vaccine Bivalent Booster 20yrs & up 02/28/2021, 04/12/2022   Pneumococcal Conjugate-13 12/06/2014   Pneumococcal Polysaccharide-23 05/13/2001, 03/11/2009   Polio, Unspecified 02/02/1958   Tdap 11/21/2011   Zoster Recombinant(Shingrix) 07/18/2022, 10/19/2022   Pertinent  Health Maintenance Due  Topic Date Due   INFLUENZA VACCINE  01/10/2023   DEXA SCAN  Completed      02/16/2022   12:00 AM 02/16/2022    9:45 AM 02/27/2022   11:41 AM 07/06/2022   10:04 AM 07/13/2022    3:55 PM  Fall Risk  Falls in the past year?    0 0  Was there an injury with Fall?    0 0  Fall Risk Category Calculator    0 0  (RETIRED) Patient Fall Risk Level High fall risk High fall risk High fall risk    Patient  at Risk for Falls Due to   History of fall(s) No Fall Risks No Fall Risks  Fall risk Follow up   Falls evaluation completed  Falls evaluation completed   Functional Status Survey:    Vitals:   03/25/23 1050  BP: (!) 160/82  Pulse: 64  Resp: 18  Temp: 97.8 F (36.6 C)  SpO2: 93%  Weight: 205 lb 12.8 oz (93.4 kg)  Height: 5\' 6"  (1.676 m)   Body mass index is 33.22 kg/m. Physical Exam Vitals reviewed.  Constitutional:      General: She is not in acute distress. HENT:     Head: Normocephalic.     Nose: Nose normal.     Mouth/Throat:     Mouth: Mucous membranes are moist.  Eyes:     General:        Right eye: No discharge.        Left eye: No discharge.  Cardiovascular:     Rate and Rhythm: Normal rate and regular rhythm.     Pulses: Normal pulses.     Heart sounds: Normal heart sounds.  Pulmonary:     Effort: Pulmonary effort is normal. No respiratory distress.     Breath sounds: Normal breath sounds. No wheezing, rhonchi or rales.  Abdominal:  General: Bowel sounds are normal.     Palpations: Abdomen is soft.  Musculoskeletal:     Cervical back: Neck supple.     Right lower leg: No edema.     Left lower leg: No edema.  Skin:    General: Skin is warm.     Capillary Refill: Capillary refill takes less than 2 seconds.  Neurological:     General: No focal deficit present.     Mental Status: She is alert. Mental status is at baseline.     Motor: Weakness present.     Gait: Gait abnormal.  Psychiatric:        Mood and Affect: Mood normal.     Labs reviewed: Recent Labs    07/05/22 0000 01/18/23 0000 01/26/23 0000  NA 140 140 139  K 4.0 3.7 4.3  CL 104 104 103  CO2 28* 28* 24*  BUN 21 16 13   CREATININE 0.9 0.8 0.9  CALCIUM 9.2 9.1 9.6   Recent Labs    07/05/22 0000 01/18/23 0000 01/26/23 0000  AST 23 23 24   ALT 18 16 17   ALKPHOS 44 37 40  ALBUMIN 3.8 3.7 3.7   Recent Labs    06/25/22 0000 01/18/23 0000 01/26/23 0000  WBC 4.9 5.2 4.6   NEUTROABS 2,279.00 2,782.00 1,697.00  HGB 14.6 13.4 13.6  HCT 44 41 41  PLT 153 113* 121*   Lab Results  Component Value Date   TSH 4.38 01/26/2023   No results found for: "HGBA1C" Lab Results  Component Value Date   CHOL 193 05/13/2017   HDL 41 05/13/2017   LDLCALC 122 (H) 05/13/2017   TRIG 152 (H) 05/13/2017   CHOLHDL 4.7 (H) 05/13/2017    Significant Diagnostic Results in last 30 days:  No results found.  Assessment/Plan 1. Acute cystitis without hematuria - 10/11 increased frequency - urine culture > 100,000 cfu/Ml citrobacter freundii - start Cipro 500 mg po BID x 5 days - encourage hydration with water  2. Acute cough - 09/30 positive covid - h/o CHF> on furosemide - continues to have acute cough - lung sounds clear today - CXR negative for infiltrate, effusion, pneumothorax - suspect rebound bronchitis from recent covid  - consider prednisone taper if symptoms continue or BNP - cont tessalon pearls   Family/ staff Communication: plan discussed with patient and nurse  Labs/tests ordered: none

## 2023-04-05 ENCOUNTER — Non-Acute Institutional Stay: Payer: Medicare Other | Admitting: Sports Medicine

## 2023-04-05 ENCOUNTER — Encounter: Payer: Self-pay | Admitting: Sports Medicine

## 2023-04-05 DIAGNOSIS — E039 Hypothyroidism, unspecified: Secondary | ICD-10-CM | POA: Diagnosis not present

## 2023-04-05 DIAGNOSIS — I10 Essential (primary) hypertension: Secondary | ICD-10-CM | POA: Diagnosis not present

## 2023-04-05 DIAGNOSIS — I4819 Other persistent atrial fibrillation: Secondary | ICD-10-CM

## 2023-04-05 DIAGNOSIS — I509 Heart failure, unspecified: Secondary | ICD-10-CM | POA: Diagnosis not present

## 2023-04-05 DIAGNOSIS — F0390 Unspecified dementia without behavioral disturbance: Secondary | ICD-10-CM

## 2023-04-05 DIAGNOSIS — M150 Primary generalized (osteo)arthritis: Secondary | ICD-10-CM | POA: Diagnosis not present

## 2023-04-05 NOTE — Progress Notes (Signed)
Provider:   Venita Sheffield MD Location:   [facility]   Place of Service:   Assisted living   PCP: Venita Sheffield, MD Patient Care Team: Venita Sheffield, MD as PCP - General (Internal Medicine) [name], MD (Inactive) as PCP - Electrophysiology (Cardiology) [name], MD (Inactive) as PCP - Cardiology (Cardiology) Lurline Idol, FNP [facility] and Palliative Medicine)  Extended Emergency Contact Information Primary Emergency Contact: Pearman,Karen Address: [address]          [place], [place] [address] [place] of [place] Home Phone: [phone] Mobile Phone: [phone] Relation: Daughter Secondary Emergency Contact: Gae Dry Home Phone: [phone] Mobile Phone: [phone] Relation: Daughter Interpreter needed? No  Code Status:  Goals of Care: Advanced Directive information    03/25/2023   11:00 AM  Advanced Directives  Does Patient Have a Medical Advance Directive? Yes  Type of Estate agent of [place];Out of facility DNR (pink MOST or yellow form)  Does patient want to make changes to medical advance directive? No - Patient declined  Copy of Healthcare Power of Attorney in Chart? Yes - validated most recent copy scanned in chart (See row information)      No chief complaint on file.   HPI: Patient is a 89 y.o. female seen today for routine visit for chronic disease management. Pt seen and examined today, she seems pleasant and does not appear to be in distress.  She ambulates with a walker, no recent falls.  Denies being dizzy or lightheaded Denies joint pains. Pt knows her name, oriented to place Reports that she is having memory problems Denies being depressed or anxious Sleeping fine No agitation or behavioral problems as per nursing staff  She likes to play piano.  Pt denies fevers, chills, cough, sob, orthopnea, PND, abdominal pain, nausea, vomiting, dysuria, hematuria ,  bloody or dark stools.   Past Medical History:  Diagnosis Date   Aortic stenosis 03/08/2016   Brady-tachy syndrome (HCC)    a. Biotronik dual chamber PPM implanted 2009 b. gen change to STJ dual chamber PPM 2017   Ejection fraction    EF 70%, echo, October, 2009  //   EF 55-60%, septal dyssynergy consistent with a paced rhythm, mild to moderate mitral regurgitation, echo, November, 2015    GERD (gastroesophageal reflux disease) 04/28/2014   Episodes November, 2015 with fluid refluxing from her esophagus.    Hair loss    Patient questioned Coumadin, changed toPradaxa   Hypertension    Hypothyroidism    Lower extremity edema 01/22/2018   Mitral regurgitation 05/03/2014   Mild-to-moderate mitral regurgitation, echo, November, 2015    Osteoarthritis of left knee 10/25/2017   Paroxysmal atrial fibrillation (HCC)    Episodes rapid atrial fib noted by pacemaker interrogation, August, 2011, diltiazem added, patient improved. Patient continues on Rythmol  //   Changed to flecainide 2013  //   flecainide level checked in 2013, good level    Persistent atrial fibrillation (HCC)    Past Surgical History:  Procedure Laterality Date   ABDOMINAL HYSTERECTOMY     EP IMPLANTABLE DEVICE N/A 08/08/2015   Generator change with [employer] DR PPM by Dr Johney Frame   JOINT REPLACEMENT     PACEMAKER PLACEMENT  2009        reports that she quit smoking about 32 years ago. Her smoking use included cigarettes. She has never used smokeless tobacco. She reports that she does not drink alcohol and does not use drugs. Social History  Socioeconomic History   Marital status: Widowed    Spouse name: Not on file   Number of children: Not on file   Years of education: Not on file   Highest education level: Not on file  Occupational History   Not on file  Tobacco Use   Smoking status: Former    Current packs/day: 0.00    Types: Cigarettes    Quit date: 09/06/1990    Years since quitting: 32.6   Smokeless  tobacco: Never  Vaping Use   Vaping status: Never Used  Substance and Sexual Activity   Alcohol use: No   Drug use: No   Sexual activity: Not on file  Other Topics Concern   Not on file  Social History Narrative   Not on file   Social Determinants of Health   Financial Resource Strain: Not on file  Food Insecurity: No Food Insecurity (02/15/2022)   Hunger Vital Sign    Worried About Running Out of Food in the Last Year: Never true    Ran Out of Food in the Last Year: Never true  Transportation Needs: No Transportation Needs (02/15/2022)   PRAPARE - Administrator, Civil Service (Medical): No    Lack of Transportation (Non-Medical): No  Physical Activity: Not on file  Stress: Not on file  Social Connections: Not on file  Intimate Partner Violence: Not At Risk (02/15/2022)   Humiliation, Afraid, Rape, and Kick questionnaire    Fear of Current or Ex-Partner: No    Emotionally Abused: No    Physically Abused: No    Sexually Abused: No    Functional Status Survey:    Family History  Problem Relation Age of Onset   Heart disease Father    Coronary artery disease Other    Heart disease Son     Health Maintenance  Topic Date Due   DTaP/Tdap/Td (2 - Td or Tdap) 11/20/2021   INFLUENZA VACCINE  01/10/2023   COVID-19 Vaccine (7 - 2023-24 season) 02/10/2023   Medicare Annual Wellness (AWV)  07/14/2023   Pneumonia Vaccine 63+ Years old  Completed   DEXA SCAN  Completed   Zoster Vaccines- Shingrix  Completed   HPV VACCINES  Aged Out      Outpatient Encounter Medications as of 04/05/2023  Medication Sig   acetaminophen (TYLENOL) 325 MG tablet Take 2 tablets (650 mg total) by mouth every 6 (six) hours as needed.   acetaminophen (TYLENOL) 500 MG tablet Take 1,000 mg by mouth in the morning and at bedtime.   Apoaequorin (PREVAGEN) 10 MG CAPS Take 10 mg by mouth daily.   benzonatate (TESSALON) 200 MG capsule Take 200 mg by mouth 3 (three) times daily.   betamethasone,  augmented, (DIPROLENE) 0.05 % gel Apply topically daily.  Apply to scalp topically every 12 hours as needed for psoriasis related to PSORIASIS, UNSPECIFIED (L40.9) apply to affected area of scalp   Calcium Carbonate-Vit D-Min (CALCIUM 600+D PLUS MINERALS) 600-400 MG-UNIT TABS Take 1 tablet by mouth daily.   Carboxymethylcell-Glycerin PF (REFRESH RELIEVA PF) 0.5-1 % SOLN Apply 1 drop to eye in the morning and at bedtime. for relieve discomfort   ciprofloxacin (CIPRO) 500 MG tablet Take 500 mg by mouth 2 (two) times daily.   diclofenac Sodium (VOLTAREN) 1 % GEL Apply 1 g topically 2 (two) times daily as needed.   ELIQUIS 5 MG TABS tablet TAKE ONE TABLET BY MOUTH TWICE A DAY   flecainide (TAMBOCOR) 50 MG tablet Take 1 tablet (  50 mg total) by mouth 2 (two) times daily.   furosemide (LASIX) 20 MG tablet TAKE ONE TABLET BY MOUTH EVERY DAY   hydrocortisone 2.5 % ointment Apply to affected areas topically as needed for Rashy/itchy areas two times daily-MAY SELF ADMINISTER-per pt request   Ipratropium-Albuterol (COMBIVENT) 20-100 MCG/ACT AERS respimat Inhale 1 puff into the lungs every 6 (six) hours.   levothyroxine (SYNTHROID) 100 MCG tablet Take 1 tablet (100 mcg total) by mouth every morning.   memantine (NAMENDA) 10 MG tablet Take 1 tablet (10 mg total) by mouth 2 (two) times daily. for cognitive decline   metoprolol succinate (TOPROL-XL) 25 MG 24 hr tablet Take 3 tablets (75 mg total) by mouth daily.   Multiple Vitamin (MULTIVITAMIN ADULT) TABS Take 1 tablet by mouth daily.   Multiple Vitamins-Minerals (OCUVITE PO) Take 1 capsule by mouth daily.   potassium chloride (KLOR-CON M) 10 MEQ tablet Take 1 tablet (10 mEq total) by mouth daily.   sodium chloride (MURO 128) 5 % ophthalmic ointment Place 1 Application into both eyes at bedtime.   sodium chloride (MURO 128) 5 % ophthalmic solution Place 1 drop into both eyes in the morning and at bedtime. for decrease swelling Wait 1 hour between these and Refresh    No facility-administered encounter medications on file as of 04/05/2023.    Review of Systems  Constitutional:  Negative for chills and fever.  HENT:  Negative for sinus pressure and sore throat.   Respiratory:  Negative for cough, shortness of breath and wheezing.   Cardiovascular:  Positive for leg swelling. Negative for chest pain and palpitations.  Gastrointestinal:  Negative for abdominal distention, abdominal pain, blood in stool, constipation, diarrhea, nausea and vomiting.  Genitourinary:  Negative for dysuria, frequency and urgency.  Neurological:  Negative for dizziness, weakness and numbness.  Psychiatric/Behavioral:  Negative for confusion.     There were no vitals filed for this visit. There is no height or weight on file to calculate BMI. Physical Exam Constitutional:      Appearance: Normal appearance.  HENT:     Head: Normocephalic and atraumatic.  Cardiovascular:     Rate and Rhythm: Normal rate and regular rhythm.     Heart sounds: No murmur heard. Pulmonary:     Effort: Pulmonary effort is normal. No respiratory distress.     Breath sounds: Normal breath sounds. No wheezing.  Abdominal:     General: Bowel sounds are normal. There is no distension.     Tenderness: There is no abdominal tenderness. There is no guarding or rebound.  Musculoskeletal:        General: Swelling (1+ pitting odema, wearing compression stockings) present. No tenderness.  Skin:    General: Skin is dry.  Neurological:     Mental Status: She is alert. Mental status is at baseline.     Sensory: No sensory deficit.     Motor: No weakness.     Labs reviewed: Basic Metabolic Panel: Recent Labs    07/05/22 0000 01/18/23 0000 01/26/23 0000  NA 140 140 139  K 4.0 3.7 4.3  CL 104 104 103  CO2 28* 28* 24*  BUN 21 16 13   CREATININE 0.9 0.8 0.9  CALCIUM 9.2 9.1 9.6   Liver Function Tests: Recent Labs    07/05/22 0000 01/18/23 0000 01/26/23 0000  AST 23 23 24   ALT 18 16 17    ALKPHOS 44 37 40  ALBUMIN 3.8 3.7 3.7   No results for input(s): "LIPASE", "AMYLASE"  in the last 8760 hours. No results for input(s): "AMMONIA" in the last 8760 hours. CBC: Recent Labs    06/25/22 0000 01/18/23 0000 01/26/23 0000  WBC 4.9 5.2 4.6  NEUTROABS 2,279.00 2,782.00 1,697.00  HGB 14.6 13.4 13.6  HCT 44 41 41  PLT 153 113* 121*   Cardiac Enzymes: No results for input(s): "CKTOTAL", "CKMB", "CKMBINDEX", "TROPONINI" in the last 8760 hours. BNP: Invalid input(s): "POCBNP" No results found for: "HGBA1C" Lab Results  Component Value Date   TSH 4.38 01/26/2023   No results found for: "VITAMINB12" No results found for: "FOLATE" No results found for: "IRON", "TIBC", "FERRITIN"  Imaging and Procedures obtained prior to SNF admission: CUP PACEART REMOTE DEVICE CHECK  Result Date: 07/13/2022 Scheduled remote reviewed. Normal device function.  1 atrial arrhythmia detected, 22sec in duration Next remote 91 days. LA   Assessment/Plan  1. Major neurocognitive disorder (HCC) No behavioral problems as per nursing staff   CT head 02/12/22 Mild age-related atrophy and chronic microvascular ischemic  On nam[place]  Pt reported that she plays piano Instructed patient to increase physical activity and cognitively engaging activities   2. Persistent atrial fibrillation (HCC) Rate controlled  Cont with flecainide, metoprolol, eliquis  No signs of bleeding   3. Chronic congestive heart failure, unspecified heart failure type (HCC) Lungs clear Chronic lower extremity swelling  Salt restriction  Cont with lasix, potassium supplements    Latest Ref Rng & Units 01/26/2023   12:00 AM 01/18/2023   12:00 AM 07/05/2022   12:00 AM  BMP  BUN 4 - 21 13     16     21       Creatinine 0.5 - 1.1 0.9     0.8     0.9      Sodium 137 - 147 139     140     140      Potassium 3.5 - 5.1 mEq/L 4.3     3.7     4.0      Chloride 99 - 108 103     104     104      CO2 13 - 22 24     28     28        Calcium 8.7 - 10.7 9.6     9.1     9.2         This result is from an external source.        Hypothyroidism, unspecified type Lab Results  Component Value Date   TSH 4.38 01/26/2023   Cont with synthyroid  6. Essential hypertension, benign Bp slightly high today  Will instruct nurse to monitor bp for a week  Cont with metoprolol  7. Primary osteoarthritis involving multiple joints Denies pain currently  Take tylenol prn for pain    Family/ staff Communication:   Labs/tests ordered:care plan discussed with the nursing staff  I spent greater than  30 minutes for the care of this patient in face to face time, chart review, clinical documentation, patient education.

## 2023-04-05 NOTE — Addendum Note (Signed)
Addended by: Venita Sheffield on: 04/05/2023 01:59 PM   Modules accepted: Level of Service

## 2023-04-09 ENCOUNTER — Non-Acute Institutional Stay (INDEPENDENT_AMBULATORY_CARE_PROVIDER_SITE_OTHER): Payer: Self-pay | Admitting: Nurse Practitioner

## 2023-04-09 ENCOUNTER — Encounter: Payer: Self-pay | Admitting: Nurse Practitioner

## 2023-04-09 DIAGNOSIS — Z0000 Encounter for general adult medical examination without abnormal findings: Secondary | ICD-10-CM

## 2023-04-09 NOTE — Progress Notes (Signed)
Subjective:   [name] is a 89 y.o. female who presents for Medicare Annual (Subsequent) preventive examination.  Visit Complete: In person  Patient Medicare AWV questionnaire was completed by the patient on 04/09/23; I have confirmed that all information answered by patient is correct and no changes since this date.  Cardiac Risk Factors include: advanced age (>74men, >66 women);dyslipidemia;obesity (BMI >30kg/m2)     Objective:    Today's Vitals   04/09/23 1041  BP: (!) 120/58  Pulse: 65  Resp: 17  Temp: (!) 97.3 F (36.3 C)  SpO2: 95%  Weight: 205 lb 8 oz (93.2 kg)  Height: 5\' 6"  (1.676 m)   Body mass index is 33.17 kg/m.     04/09/2023   10:45 AM 03/25/2023   11:00 AM 03/22/2023    3:26 PM 03/22/2023    3:19 PM 01/24/2023    1:32 PM 01/11/2023    3:01 PM 01/01/2023   11:29 AM  Advanced Directives  Does Patient Have a Medical Advance Directive? Yes Yes Yes Yes Yes Yes Yes  Type of Estate agent of [place];Out of facility DNR (pink MOST or yellow form) Healthcare Power of [place];Out of facility DNR (pink MOST or yellow form) Healthcare Power of [place];Living will Healthcare Power of [place];Living will;Out of facility DNR (pink MOST or yellow form) Healthcare Power of [place];Out of facility DNR (pink MOST or yellow form) Healthcare Power of [place];Out of facility DNR (pink MOST or yellow form) Healthcare Power of [place];Out of facility DNR (pink MOST or yellow form)  Does patient want to make changes to medical advance directive? No - Patient declined No - Patient declined No - Patient declined No - Patient declined No - Patient declined No - Patient declined No - Patient declined  Copy of Healthcare Power of Attorney in Chart? Yes - validated most recent copy scanned in chart (See row information) Yes - validated most recent copy scanned in chart (See row information) Yes - validated most recent copy scanned in chart (See row information)  Yes - validated most recent copy scanned in chart (See row information) Yes - validated most recent copy scanned in chart (See row information) Yes - validated most recent copy scanned in chart (See row information) Yes - validated most recent copy scanned in chart (See row information)  Pre-existing out of facility DNR order (yellow form or pink MOST form) Yellow form placed in chart (order not valid for inpatient use)      Yellow form placed in chart (order not valid for inpatient use)    Current Medications (verified) Outpatient Encounter Medications as of 04/09/2023  Medication Sig   acetaminophen (TYLENOL) 325 MG tablet Take 2 tablets (650 mg total) by mouth every 6 (six) hours as needed.   acetaminophen (TYLENOL) 500 MG tablet Take 1,000 mg by mouth in the morning and at bedtime.   Apoaequorin (PREVAGEN) 10 MG CAPS Take 10 mg by mouth daily.   betamethasone, augmented, (DIPROLENE) 0.05 % gel Apply topically daily.  Apply to scalp topically every 12 hours as needed for psoriasis related to PSORIASIS, UNSPECIFIED (L40.9) apply to affected area of scalp   Calcium Carbonate-Vit D-Min (CALCIUM 600+D PLUS MINERALS) 600-400 MG-UNIT TABS Take 1 tablet by mouth daily.   Carboxymethylcell-Glycerin PF (REFRESH RELIEVA PF) 0.5-1 % SOLN Apply 1 drop to eye in the morning and at bedtime. for relieve discomfort   diclofenac Sodium (VOLTAREN) 1 % GEL Apply 1 g topically 2 (two) times daily as needed.  ELIQUIS 5 MG TABS tablet TAKE ONE TABLET BY MOUTH TWICE A DAY   flecainide (TAMBOCOR) 50 MG tablet Take 1 tablet (50 mg total) by mouth 2 (two) times daily.   furosemide (LASIX) 20 MG tablet TAKE ONE TABLET BY MOUTH EVERY DAY   hydrocortisone 2.5 % ointment Apply to affected areas topically as needed for Rashy/itchy areas two times daily-MAY SELF ADMINISTER-per pt request   Ipratropium-Albuterol (COMBIVENT) 20-100 MCG/ACT AERS respimat Inhale 1 puff into the lungs every 6 (six) hours.   levothyroxine  (SYNTHROID) 100 MCG tablet Take 1 tablet (100 mcg total) by mouth every morning.   memantine (NAMENDA) 10 MG tablet Take 1 tablet (10 mg total) by mouth 2 (two) times daily. for cognitive decline   metoprolol succinate (TOPROL-XL) 25 MG 24 hr tablet Take 3 tablets (75 mg total) by mouth daily.   Multiple Vitamin (MULTIVITAMIN ADULT) TABS Take 1 tablet by mouth daily.   Multiple Vitamins-Minerals (OCUVITE PO) Take 1 capsule by mouth daily.   potassium chloride (KLOR-CON M) 10 MEQ tablet Take 1 tablet (10 mEq total) by mouth daily.   sodium chloride (MURO 128) 5 % ophthalmic ointment Place 1 Application into both eyes at bedtime.   sodium chloride (MURO 128) 5 % ophthalmic solution Place 1 drop into both eyes in the morning and at bedtime. for decrease swelling Wait 1 hour between these and Refresh   benzonatate (TESSALON) 200 MG capsule Take 200 mg by mouth 3 (three) times daily. (Patient not taking: Reported on 04/09/2023)   ciprofloxacin (CIPRO) 500 MG tablet Take 500 mg by mouth 2 (two) times daily. (Patient not taking: Reported on 04/09/2023)   No facility-administered encounter medications on file as of 04/09/2023.    Allergies (verified) Morphine and codeine, Penicillins, Lasix [furosemide], Aspirin, Clindamycin/lincomycin, Crestor [rosuvastatin], Diltiazem, Erythromycin, Lisinopril, Metronidazole, Morphine, and Penicillins cross reactors   History: Past Medical History:  Diagnosis Date   Aortic stenosis 03/08/2016   Brady-tachy syndrome (HCC)    a. Biotronik dual chamber PPM implanted 2009 b. gen change to STJ dual chamber PPM 2017   Ejection fraction    EF 70%, echo, October, 2009  //   EF 55-60%, septal dyssynergy consistent with a paced rhythm, mild to moderate mitral regurgitation, echo, November, 2015    GERD (gastroesophageal reflux disease) 04/28/2014   Episodes November, 2015 with fluid refluxing from her esophagus.    Hair loss    Patient questioned Coumadin, changed  toPradaxa   Hypertension    Hypothyroidism    Lower extremity edema 01/22/2018   Mitral regurgitation 05/03/2014   Mild-to-moderate mitral regurgitation, echo, November, 2015    Osteoarthritis of left knee 10/25/2017   Paroxysmal atrial fibrillation (HCC)    Episodes rapid atrial fib noted by pacemaker interrogation, August, 2011, diltiazem added, patient improved. Patient continues on Rythmol  //   Changed to flecainide 2013  //   flecainide level checked in 2013, good level    Persistent atrial fibrillation (HCC)    Past Surgical History:  Procedure Laterality Date   ABDOMINAL HYSTERECTOMY     EP IMPLANTABLE DEVICE N/A 08/08/2015   Generator change with SJM [employer] DR PPM by Dr Johney Frame   JOINT REPLACEMENT     PACEMAKER PLACEMENT  2009       Family History  Problem Relation Age of Onset   Heart disease Father    Coronary artery disease Other    Heart disease Son    Social History   Socioeconomic History  Marital status: Widowed    Spouse name: Not on file   Number of children: Not on file   Years of education: Not on file   Highest education level: Not on file  Occupational History   Not on file  Tobacco Use   Smoking status: Former    Current packs/day: 0.00    Types: Cigarettes    Quit date: 09/06/1990    Years since quitting: 32.6   Smokeless tobacco: Never  Vaping Use   Vaping status: Never Used  Substance and Sexual Activity   Alcohol use: No   Drug use: No   Sexual activity: Not on file  Other Topics Concern   Not on file  Social History Narrative   Not on file   Social Determinants of Health   Financial Resource Strain: Not on file  Food Insecurity: No Food Insecurity (02/15/2022)   Hunger Vital Sign    Worried About Running Out of Food in the Last Year: Never true    Ran Out of Food in the Last Year: Never true  Transportation Needs: No Transportation Needs (02/15/2022)   PRAPARE - Administrator, Civil Service (Medical): No    Lack of  Transportation (Non-Medical): No  Physical Activity: Not on file  Stress: Not on file  Social Connections: Not on file    Tobacco Counseling Counseling given: Not Answered   Clinical Intake:  Pre-visit preparation completed: Yes  Pain : No/denies pain     BMI - recorded: 33.17 Nutritional Status: BMI > 30  Obese Nutritional Risks: None Diabetes: No  How often do you need to have someone help you when you read instructions, pamphlets, or other written materials from your doctor or pharmacy?: 4 - Often What is the last grade level you completed in school?: high school  Interpreter Needed?: No  Information entered by :: Atwood Adcock Nedra Hai NP   Activities of Daily Living    04/09/2023    4:34 PM 07/13/2022    1:07 PM  In your present state of health, do you have any difficulty performing the following activities:  Hearing? 0 1  Comment  hearing aids.  Vision? 0 0  Difficulty concentrating or making decisions? 1 0  Walking or climbing stairs? 1 1  Dressing or bathing?  1  Doing errands, shopping? 1 1  Preparing Food and eating ? N N  Using the Toilet? N N  In the past six months, have you accidently leaked urine? Y Y  Do you have problems with loss of bowel control? N N  Managing your Medications? Y Y  Managing your Finances? Malvin Johns  Housekeeping or managing your Housekeeping? Malvin Johns    Patient Care Team: Venita Sheffield, MD as PCP - General (Internal Medicine) [name], MD (Inactive) as PCP - Electrophysiology (Cardiology) [name], MD (Inactive) as PCP - Cardiology (Cardiology) Lurline Idol, FNP (Hospice and Palliative Medicine)  Indicate any recent Medical Services you may have received from other than [facility] providers in the past year (date may be approximate).     Assessment:   This is a routine wellness examination for [place].  Hearing/Vision screen No results found.   Goals Addressed             This Visit's Progress    Maintain  Mobility and Function       Evidence-based guidance:  Acknowledge and validate impact of pain, loss of strength and potential disfigurement (hand osteoarthritis) on mental health  and daily life, such as social isolation, anxiety, depression, impaired sexual relationship and   injury from falls.  Anticipate referral to physical or occupational therapy for assessment, therapeutic exercise and recommendation for adaptive equipment or assistive devices; encourage participation.  Assess impact on ability to perform activities of daily living, as well as engage in sports and leisure events or requirements of work or school.  Provide anticipatory guidance and reassurance about the benefit of exercise to maintain function; acknowledge and normalize fear that exercise may worsen symptoms.  Encourage regular exercise, at least 10 minutes at a time for 45 minutes per week; consider yoga, water exercise and proprioceptive exercises; encourage use of wearable activity tracker to increase motivation and adherence.  Encourage maintenance or resumption of daily activities, including employment, as pain allows and with minimal exposure to trauma.  Assist patient to advocate for adaptations to the work environment.  Consider level of pain and function, gender, age, lifestyle, patient preference, quality of life, readiness and ?ocapacity to benefit? when recommending patients for orthopaedic surgery consultation.  Explore strategies, such as changes to medication regimen or activity that enables patient to anticipate and manage flare-ups that increase deconditioning and disability.  Explore patient preferences; encourage exposure to a broader range of activities that have been avoided for fear of experiencing pain.  Identify barriers to participation in therapy or exercise, such as pain with activity, anticipated or imagined pain.  Monitor postoperative joint replacement or any preexisting joint replacement for  ongoing pain and loss of function; provide social support and encouragement throughout recovery.   Notes:        Depression Screen    04/09/2023    4:35 PM  PHQ 2/9 Scores  PHQ - 2 Score 0    Fall Risk    03/25/2023    4:51 PM 07/13/2022    3:55 PM 07/06/2022   10:04 AM 02/27/2022   11:41 AM 01/23/2022    3:53 PM  Fall Risk   Falls in the past year? 1 0 0  1  Number falls in past yr: 0 0 0    Injury with Fall? 0 0 0    Risk for fall due to : History of fall(s);Impaired balance/gait;Impaired mobility No Fall Risks No Fall Risks History of fall(s)   Follow up Falls evaluation completed;Falls prevention discussed;Education provided Falls evaluation completed  Falls evaluation completed     MEDICARE RISK AT HOME: Medicare Risk at Home Any stairs in or around the home?: Yes If so, are there any without handrails?: No Home free of loose throw rugs in walkways, pet beds, electrical cords, etc?: Yes Adequate lighting in your home to reduce risk of falls?: Yes Life alert?: No Use of a cane, walker or w/c?: Yes Grab bars in the bathroom?: Yes Shower chair or bench in shower?: Yes Elevated toilet seat or a handicapped toilet?: Yes  TIMED UP AND GO:  Was the test performed?  Yes  Length of time to ambulate 10 feet: 9 sec Gait slow and steady with assistive device    Cognitive Function:        Immunizations Immunization History  Administered Date(s) Administered   Fluad Quad(high Dose 65+) 04/02/2022   Influenza Split 03/11/2009, 03/11/2010, 02/25/2011, 02/24/2019, 03/08/2020, 03/25/2021   Influenza, High Dose Seasonal PF 03/08/2015, 04/03/2017, 03/17/2018, 03/08/2020   Influenza,inj,Quad PF,6+ Mos 03/08/2016   Influenza,inj,quad, With Preservative 03/11/2017   Moderna Covid-19 Vaccine Bivalent Booster 43yrs & up 10/27/2021   Moderna SARS-COV2 Booster Vaccination 11/08/2020  Moderna Sars-Covid-2 Vaccination 06/15/2019, 07/13/2019, 04/19/2020, 02/27/2022   Pfizer Covid-19  Vaccine Bivalent Booster 31yrs & up 02/28/2021, 04/12/2022   Pneumococcal Conjugate-13 12/06/2014   Pneumococcal Polysaccharide-23 05/13/2001, 03/11/2009   Polio, Unspecified 02/02/1958   Tdap 11/21/2011   Zoster Recombinant(Shingrix) 07/18/2022, 10/19/2022    TDAP status: Due, Education has been provided regarding the importance of this vaccine. Advised may receive this vaccine at local pharmacy or Health Dept. Aware to provide a copy of the vaccination record if obtained from local pharmacy or Health Dept. Verbalized acceptance and understanding.  Flu Vaccine status: Due, Education has been provided regarding the importance of this vaccine. Advised may receive this vaccine at local pharmacy or Health Dept. Aware to provide a copy of the vaccination record if obtained from local pharmacy or Health Dept. Verbalized acceptance and understanding.  Pneumococcal vaccine status: Up to date  Covid-19 vaccine status: Information provided on how to obtain vaccines.   Qualifies for Shingles Vaccine? Yes   Zostavax completed Yes   Shingrix Completed?: Yes  Screening Tests Health Maintenance  Topic Date Due   DTaP/Tdap/Td (2 - Td or Tdap) 11/20/2021   INFLUENZA VACCINE  01/10/2023   COVID-19 Vaccine (7 - 2023-24 season) 02/10/2023   Medicare Annual Wellness (AWV)  04/08/2024   Pneumonia Vaccine 11+ Years old  Completed   DEXA SCAN  Completed   Zoster Vaccines- Shingrix  Completed   HPV VACCINES  Aged Out    Health Maintenance  Health Maintenance Due  Topic Date Due   DTaP/Tdap/Td (2 - Td or Tdap) 11/20/2021   INFLUENZA VACCINE  01/10/2023   COVID-19 Vaccine (7 - 2023-24 season) 02/10/2023    Colorectal cancer screening: No longer required.   Mammogram status: No longer required due to aged out.  Bone Density status: Completed 02/18/01. Results reflect: Bone density results: OSTEOPENIA. Repeat every never per patient years.  Lung Cancer Screening: (Low Dose CT Chest recommended if  Age 66-80 years, 20 pack-year currently smoking OR have quit w/in 15years.) does not qualify.   Lung Cancer Screening Referral: NA  Additional Screening:  Hepatitis C Screening: does not qualify;  Vision Screening: Recommended annual ophthalmology exams for early detection of glaucoma and other disorders of the eye. Is the patient up to date with their annual eye exam?  No  Who is the provider or what is the name of the office in which the patient attends annual eye exams? [facility] will provide If pt is not established with a provider, would they like to be referred to a provider to establish care? No .   Dental Screening: Recommended annual dental exams for proper oral hygiene  Diabetic Foot Exam: NA  Community Resource Referral / Chronic Care Management: CRR required this visit?  No   CCM required this visit?  No     Plan:     I have personally reviewed and noted the following in the patient's chart:   Medical and social history Use of alcohol, tobacco or illicit drugs  Current medications and supplements including opioid prescriptions. Patient is not currently taking opioid prescriptions. Functional ability and status Nutritional status Physical activity Advanced directives List of other physicians Hospitalizations, surgeries, and ER visits in previous 12 months Vitals Screenings to include cognitive, depression, and falls Referrals and appointments  In addition, I have reviewed and discussed with patient certain preventive protocols, quality metrics, and best practice recommendations. A written personalized care plan for preventive services as well as general preventive health recommendations were provided to  patient.     Darcee Dekker X Dariella Gillihan, NP   04/09/2023   After Visit Summary: (In Person-Declined) Patient declined AVS at this time.

## 2023-04-12 ENCOUNTER — Ambulatory Visit (INDEPENDENT_AMBULATORY_CARE_PROVIDER_SITE_OTHER): Payer: Medicare Other

## 2023-04-12 DIAGNOSIS — I495 Sick sinus syndrome: Secondary | ICD-10-CM | POA: Diagnosis not present

## 2023-04-12 LAB — CUP PACEART REMOTE DEVICE CHECK
Battery Remaining Longevity: 40 mo
Battery Remaining Percentage: 32 %
Battery Voltage: 2.95 V
Brady Statistic AP VP Percent: 1.7 %
Brady Statistic AP VS Percent: 96 %
Brady Statistic AS VP Percent: 1 %
Brady Statistic AS VS Percent: 2.5 %
Brady Statistic RA Percent Paced: 97 %
Brady Statistic RV Percent Paced: 1.7 %
Date Time Interrogation Session: 20241101040013
Implantable Lead Connection Status: 753985
Implantable Lead Connection Status: 753985
Implantable Lead Implant Date: 20091109
Implantable Lead Implant Date: 20091109
Implantable Lead Location: 753859
Implantable Lead Location: 753860
Implantable Lead Model: 350
Implantable Lead Serial Number: 24891460
Implantable Lead Serial Number: 28411861
Implantable Pulse Generator Implant Date: 20170227
Lead Channel Impedance Value: 1300 Ohm
Lead Channel Impedance Value: 510 Ohm
Lead Channel Pacing Threshold Amplitude: 0.75 V
Lead Channel Pacing Threshold Amplitude: 1 V
Lead Channel Pacing Threshold Pulse Width: 0.4 ms
Lead Channel Pacing Threshold Pulse Width: 0.4 ms
Lead Channel Sensing Intrinsic Amplitude: 1.7 mV
Lead Channel Sensing Intrinsic Amplitude: 12 mV
Lead Channel Setting Pacing Amplitude: 2 V
Lead Channel Setting Pacing Amplitude: 2.5 V
Lead Channel Setting Pacing Pulse Width: 0.4 ms
Lead Channel Setting Sensing Sensitivity: 2 mV
Pulse Gen Model: 2240
Pulse Gen Serial Number: 7885781

## 2023-04-23 ENCOUNTER — Other Ambulatory Visit: Payer: Self-pay | Admitting: Family Medicine

## 2023-04-24 ENCOUNTER — Encounter: Payer: Self-pay | Admitting: Nurse Practitioner

## 2023-04-24 ENCOUNTER — Non-Acute Institutional Stay: Payer: Medicare Other | Admitting: Nurse Practitioner

## 2023-04-24 DIAGNOSIS — I480 Paroxysmal atrial fibrillation: Secondary | ICD-10-CM | POA: Diagnosis not present

## 2023-04-24 DIAGNOSIS — M150 Primary generalized (osteo)arthritis: Secondary | ICD-10-CM

## 2023-04-24 DIAGNOSIS — E039 Hypothyroidism, unspecified: Secondary | ICD-10-CM | POA: Diagnosis not present

## 2023-04-24 DIAGNOSIS — R200 Anesthesia of skin: Secondary | ICD-10-CM | POA: Diagnosis not present

## 2023-04-24 DIAGNOSIS — R5382 Chronic fatigue, unspecified: Secondary | ICD-10-CM

## 2023-04-24 DIAGNOSIS — I509 Heart failure, unspecified: Secondary | ICD-10-CM | POA: Diagnosis not present

## 2023-04-24 DIAGNOSIS — I10 Essential (primary) hypertension: Secondary | ICD-10-CM

## 2023-04-24 DIAGNOSIS — I495 Sick sinus syndrome: Secondary | ICD-10-CM

## 2023-04-24 DIAGNOSIS — F0390 Unspecified dementia without behavioral disturbance: Secondary | ICD-10-CM | POA: Diagnosis not present

## 2023-04-24 DIAGNOSIS — N1831 Chronic kidney disease, stage 3a: Secondary | ICD-10-CM

## 2023-04-24 DIAGNOSIS — E876 Hypokalemia: Secondary | ICD-10-CM | POA: Diagnosis not present

## 2023-04-24 DIAGNOSIS — R1319 Other dysphagia: Secondary | ICD-10-CM | POA: Diagnosis not present

## 2023-04-24 NOTE — Assessment & Plan Note (Addendum)
Fluctuating in Bp, on Metoprolol, Furosemide,  f/u Cardiology, monitor Bp/P

## 2023-04-24 NOTE — Progress Notes (Signed)
Cardiology Office Note    Date:  04/26/2023  ID:  MARASIA LEIGHTON, DOB 12-Oct-1927, MRN [id number] PCP:  Venita Sheffield, MD  Cardiologist:  [name], MD (Inactive)  Electrophysiologist:  [name], MD (Inactive)   Chief Complaint: f/u aortic stenosis  History of Present Illness: .    [name] is a 89 y.o. female with visit-pertinent history of tachy-brady syndrome s/p STJ PPM 2009 with gen change 2017, moderate-severe AS, mild MR, PAF/atrial flutter followed by EP, chronic HFpEF, GERD, HTN, hypothyroidism, aortic stenosis, prior fall, sepsis, UTI seen for general cardiology follow-up. She previously followed with Dr. Johney Frame, Dr. Delton See, and Dr. Shari Prows. She is on flecainide and Eliquis for her atrial arrhythmias and follows with EP with last APP visit 02/2022. Last echo 09/2022 showed EF 70-75%, G1DD, mild MR, mod-severe AS, recommended to schedule cancelled follow-up. Of note she did see Carlean Jews in 10/2021 at the time she had moderate AS who suggested serial monitoring and indicated she would be a good TAVR candidate if progressed to severe. She has periodically required diuretic adjustment for chronic intermittent LE edema.  She returns for follow-up with her daughter reporting she is doing great. She lives at [facility] and walks to the dining room 3x/day. It's about a block. She ambulates with a walker. She denies any CP, SOB, palpitations, pre syncope or syncope. Today is one of the days where her lower extremity edema is more significant. She previously did well with a brief dose increase in 2023. She sits with her legs down a lot.  Labwork independently reviewed: 03/2023 K 4.4, Cr 0.9, Hgb 14, plt ok 01/2023 TSH OK, LFTs ok 2018 LDL 122 (no longer followed by cardiology)  ROS: .    Please see the history of present illness.  All other systems are reviewed and otherwise negative.  Studies Reviewed: Marland Kitchen    EKG:  EKG is ordered today, personally  reviewed, demonstrating NSR 62bpm, first degree AVB, LVH with secondary repol changes, occasional atrial pacing  CV Studies: Cardiac studies reviewed are outlined and summarized above. Otherwise please see EMR for full report.   Current Reported Medications:.    Current Meds  Medication Sig   acetaminophen (TYLENOL) 325 MG tablet Take 650 mg by mouth every 6 (six) hours as needed.   acetaminophen (TYLENOL) 500 MG tablet Take 1,000 mg by mouth in the morning and at bedtime.   Apoaequorin (PREVAGEN) 10 MG CAPS Take 10 mg by mouth daily.   betamethasone, augmented, (DIPROLENE) 0.05 % gel Apply topically daily.  Apply to scalp topically every 12 hours as needed for psoriasis related to PSORIASIS, UNSPECIFIED (L40.9) apply to affected area of scalp   Calcium Carbonate-Vit D-Min (CALCIUM 600+D PLUS MINERALS) 600-400 MG-UNIT TABS Take 1 tablet by mouth daily.   Carboxymethylcell-Glycerin PF (REFRESH RELIEVA PF) 0.5-1 % SOLN Apply 1 drop to eye in the morning and at bedtime. for relieve discomfort   diclofenac Sodium (VOLTAREN) 1 % GEL Apply 1 g topically 2 (two) times daily as needed.   ELIQUIS 5 MG TABS tablet TAKE ONE TABLET BY MOUTH TWICE A DAY   flecainide (TAMBOCOR) 50 MG tablet Take 1 tablet (50 mg total) by mouth 2 (two) times daily.   furosemide (LASIX) 20 MG tablet TAKE ONE TABLET BY MOUTH EVERY DAY   hydrocortisone 2.5 % ointment Apply to affected areas topically as needed for Rashy/itchy areas two times daily-MAY SELF ADMINISTER-per pt request   Ipratropium-Albuterol (COMBIVENT) 20-100 MCG/ACT  AERS respimat Inhale 1 puff into the lungs every 6 (six) hours.   levothyroxine (SYNTHROID) 100 MCG tablet Take 1 tablet (100 mcg total) by mouth every morning.   metoprolol succinate (TOPROL-XL) 25 MG 24 hr tablet Take 3 tablets (75 mg total) by mouth daily.   Multiple Vitamin (MULTIVITAMIN ADULT) TABS Take 1 tablet by mouth daily.   Multiple Vitamins-Minerals (OCUVITE PO) Take 1 capsule by mouth  daily.   NAMENDA 10 MG tablet TAKE ONE TABLET BY MOUTH TWICE A DAY FOR COGNITIVE DECLINE   potassium chloride (KLOR-CON M) 10 MEQ tablet Take 1 tablet (10 mEq total) by mouth daily.   sodium chloride (MURO 128) 5 % ophthalmic ointment Place 1 Application into both eyes at bedtime.   sodium chloride (MURO 128) 5 % ophthalmic solution Place 1 drop into both eyes in the morning and at bedtime. for decrease swelling Wait 1 hour between these and Refresh   [DISCONTINUED] acetaminophen (TYLENOL) 500 MG tablet Take 500 mg by mouth every 6 (six) hours as needed. Take 2 tablets by mouth twice daily    Physical Exam:    VS:  BP 126/68   Pulse 62   Ht 5\' 6"  (1.676 m)   Wt 207 lb (93.9 kg)   BMI 33.41 kg/m    Wt Readings from Last 3 Encounters:  04/26/23 207 lb (93.9 kg)  04/24/23 202 lb 6.4 oz (91.8 kg)  04/09/23 205 lb 8 oz (93.2 kg)    GEN: Well nourished, well developed in no acute distress NECK: No JVD; No carotid bruits CARDIAC: RRR, 2/6 SEM, no rubs or gallops RESPIRATORY:  Clear to auscultation without rales, wheezing or rhonchi  ABDOMEN: Soft, non-tender, non-distended EXTREMITIES:  Soft puffy BLE edema primarily from shins down to feet; No acute deformity   Asessement and Plan:.    1. Moderate-severe AS, also mild MR - patient exhibiting waxing/waning edema by history but no other symptoms of aortic stenosis. Per shared decision making they would like to hold off a 6 month echo given lack of progressive symptoms and repeat at the 1 year mark in 09/2023. Upon Dr. Devin Going departure I will have her establish with Dr. Lynnette Caffey as her primary cardiologist given his specialization in structural heart disease.  2. Chronic HFpEF - mild increase in edema today. Historically waxes/wanes per daughter. Wrote on [facility] paperwork to please increase Lasix to 20mg  BID and KCl to BID x 3 days then return to Lasix 20mg  daily and KCl daily with BMET in 1 week, results faxed to  [facility]. Daughter will notify if edema does not go back to baseline at which time we will discuss more sustained diuretic adjustment. See above regarding echocardiogram.  3. PAF/atrial flutter, h/o pacemaker - maintaining NSR by EKG. She is maintained on flecainide, metoprolol and Eliquis (5mg  BID dose appropriate for age/weight/creatinine). She is overdue for EP follow-up, will arrange to help to continue to follow/manage her antiarrhythmic therapy and pacemaker.  4. Essential HTN - controlled on present regimen. Follow clinically with med changes above.    Disposition: F/u with EP next available for overdue followup (former [facility] patient now following with [facility] by last OV), and Dr. Lynnette Caffey to establish in 09/2023, sooner if symptoms dictate.  Signed, Laurann Montana, PA-C

## 2023-04-24 NOTE — Assessment & Plan Note (Signed)
edema BLE, on Furosemide. EF 70-75% 09/19/21, 03/27/22 Venous US, negative DVT. 03/30/22 arterial US mild to moderate obstructive disease BLE 

## 2023-04-24 NOTE — Assessment & Plan Note (Signed)
Heart rate is in control, on Metoprolol, Flecainide, Eliquis. Hgb 14 03/25/23

## 2023-04-24 NOTE — Assessment & Plan Note (Signed)
nectar liquid 

## 2023-04-24 NOTE — Progress Notes (Signed)
Location:  [facility] Room Number: 824-A Place of Service:  ALF [id number]) Provider:  Helen Hashimoto, MD  Patient Care Team: Venita Sheffield, MD as PCP - General (Internal Medicine) [name], MD (Inactive) as PCP - Electrophysiology (Cardiology) [name], MD (Inactive) as PCP - Cardiology (Cardiology) Lurline Idol, FNP (Hospice and Palliative Medicine)  Extended Emergency Contact Information Primary Emergency Contact: Pearman,Karen Address: [address]          [place], [place] [address] [place] of [place] Home Phone: [phone] Mobile Phone: [phone] Relation: Daughter Secondary Emergency Contact: Gae Dry Home Phone: [phone] Mobile Phone: [phone] Relation: Daughter Interpreter needed? No  Code Status:  DNR Goals of care: Advanced Directive information    04/24/2023    2:55 PM  Advanced Directives  Does Patient Have a Medical Advance Directive? Yes  Type of Estate agent of [place];Out of facility DNR (pink MOST or yellow form)  Does patient want to make changes to medical advance directive? No - Patient declined  Copy of Healthcare Power of Attorney in Chart? Yes - validated most recent copy scanned in chart (See row information)  Pre-existing out of facility DNR order (yellow form or pink MOST form) Yellow form placed in chart (order not valid for inpatient use)     Chief Complaint  Patient presents with   Medical Management of Chronic Issues    Fatigue and discuss tdap and flu vaccines.    HPI:  Pt is a 89 y.o. female seen today for fatigue, generalized weakness, denied nausea, vomiting, abd pain, sore throat, congestion, cough, chest pressure/pain, palpitation, afebrile, negative COVID test today.   01/11/23 family expressed concerning of hand and fingers tingling and numbness, desires checking TSH 4.38 01/26/23 and K 4.4 03/25/23, If Namenda is the  culprit. The patient stated her tingling and numbness in hands and fingers has been the same, declined workup or neurology referral.                           Chronic numbness of R+L hands, has been using fingerless gloves which helped, declined workup, neurology referral, or medication             Senile dementia, tolerated Namenda well, helped with her mood as well. MMSE 26/30 03/23/22             Afib, on Metoprolol, Flecainide, Eliquis. Hgb 14 03/25/23             AS moderate             HTN on Metoprolol, Furosemide,  f/u Cardiology.              Pacemaker, f/u Cardiology             CHF, edema BLE, on Furosemide. EF 70-75% 09/19/21, 03/27/22 Venous US, negative DVT. 03/30/22 arterial US mild to moderate obstructive disease BLE             CKD Bun/creat 16/0.9 03/25/23             Dysphagia, nectar liquid.              Hypothyroidism, taking Levothyroxine, TSH 4.38 01/25/23             OA, takes Tylenol, ambulates with walker.              CT head 02/12/22 Mild age-related atrophy and chronic microvascular ischemic changes.  Hypokalemia, K 4.3 01/25/23    Past Medical History:  Diagnosis Date   Aortic stenosis 03/08/2016   Brady-tachy syndrome (HCC)    a. Biotronik dual chamber PPM implanted 2009 b. gen change to STJ dual chamber PPM 2017   Ejection fraction    EF 70%, echo, October, 2009  //   EF 55-60%, septal dyssynergy consistent with a paced rhythm, mild to moderate mitral regurgitation, echo, November, 2015    GERD (gastroesophageal reflux disease) 04/28/2014   Episodes November, 2015 with fluid refluxing from her esophagus.    Hair loss    Patient questioned Coumadin, changed toPradaxa   Hypertension    Hypothyroidism    Lower extremity edema 01/22/2018   Mitral regurgitation 05/03/2014   Mild-to-moderate mitral regurgitation, echo, November, 2015    Osteoarthritis of left knee 10/25/2017   Paroxysmal atrial fibrillation (HCC)    Episodes rapid atrial fib noted by  pacemaker interrogation, August, 2011, diltiazem added, patient improved. Patient continues on Rythmol  //   Changed to flecainide 2013  //   flecainide level checked in 2013, good level    Persistent atrial fibrillation (HCC)    Past Surgical History:  Procedure Laterality Date   ABDOMINAL HYSTERECTOMY     EP IMPLANTABLE DEVICE N/A 08/08/2015   Generator change with [employer] DR PPM by Dr Johney Frame   JOINT REPLACEMENT     PACEMAKER PLACEMENT  2009          Outpatient Encounter Medications as of 04/24/2023  Medication Sig   acetaminophen (TYLENOL) 325 MG tablet Take 2 tablets (650 mg total) by mouth every 6 (six) hours as needed.   acetaminophen (TYLENOL) 500 MG tablet Take 1,000 mg by mouth in the morning and at bedtime.   Apoaequorin (PREVAGEN) 10 MG CAPS Take 10 mg by mouth daily.   betamethasone, augmented, (DIPROLENE) 0.05 % gel Apply topically daily.  Apply to scalp topically every 12 hours as needed for psoriasis related to PSORIASIS, UNSPECIFIED (L40.9) apply to affected area of scalp   Calcium Carbonate-Vit D-Min (CALCIUM 600+D PLUS MINERALS) 600-400 MG-UNIT TABS Take 1 tablet by mouth daily.   Carboxymethylcell-Glycerin PF (REFRESH RELIEVA PF) 0.5-1 % SOLN Apply 1 drop to eye in the morning and at bedtime. for relieve discomfort   diclofenac Sodium (VOLTAREN) 1 % GEL Apply 1 g topically 2 (two) times daily as needed.   ELIQUIS 5 MG TABS tablet TAKE ONE TABLET BY MOUTH TWICE A DAY   flecainide (TAMBOCOR) 50 MG tablet Take 1 tablet (50 mg total) by mouth 2 (two) times daily.   furosemide (LASIX) 20 MG tablet TAKE ONE TABLET BY MOUTH EVERY DAY   hydrocortisone 2.5 % ointment Apply to affected areas topically as needed for Rashy/itchy areas two times daily-MAY SELF ADMINISTER-per pt request   Ipratropium-Albuterol (COMBIVENT) 20-100 MCG/ACT AERS respimat Inhale 1 puff into the lungs every 6 (six) hours.   levothyroxine (SYNTHROID) 100 MCG tablet Take 1 tablet (100 mcg total) by  mouth every morning.   metoprolol succinate (TOPROL-XL) 25 MG 24 hr tablet Take 3 tablets (75 mg total) by mouth daily.   Multiple Vitamin (MULTIVITAMIN ADULT) TABS Take 1 tablet by mouth daily.   Multiple Vitamins-Minerals (OCUVITE PO) Take 1 capsule by mouth daily.   NAMENDA 10 MG tablet TAKE ONE TABLET BY MOUTH TWICE A DAY FOR COGNITIVE DECLINE   potassium chloride (KLOR-CON M) 10 MEQ tablet Take 1 tablet (10 mEq total) by mouth daily.   sodium chloride (MURO 128) 5 %  ophthalmic ointment Place 1 Application into both eyes at bedtime.   sodium chloride (MURO 128) 5 % ophthalmic solution Place 1 drop into both eyes in the morning and at bedtime. for decrease swelling Wait 1 hour between these and Refresh   benzonatate (TESSALON) 200 MG capsule Take 200 mg by mouth 3 (three) times daily. (Patient not taking: Reported on 04/09/2023)   ciprofloxacin (CIPRO) 500 MG tablet Take 500 mg by mouth 2 (two) times daily. (Patient not taking: Reported on 04/09/2023)   No facility-administered encounter medications on file as of 04/24/2023.    Review of Systems  Constitutional:  Positive for fatigue. Negative for appetite change and fever.  HENT:  Positive for hearing loss and trouble swallowing. Negative for congestion, rhinorrhea, sinus pressure and sore throat.   Eyes:  Negative for visual disturbance.  Respiratory:  Negative for cough, chest tightness, shortness of breath and wheezing.   Cardiovascular:  Positive for leg swelling. Negative for chest pain and palpitations.  Gastrointestinal:  Negative for abdominal pain and constipation.  Genitourinary:  Negative for dysuria and frequency.  Musculoskeletal:  Positive for arthralgias and gait problem.       Knees, L>R, intermittent in nature.   Skin:  Negative for color change.       Chronic venous insufficiency skin changes: mild erythema in mid of R+L   Neurological:  Positive for numbness. Negative for tremors, speech difficulty, weakness and  headaches.       Memory lapses. chronic numbness in fingers.   Psychiatric/Behavioral:  Negative for confusion and sleep disturbance. The patient is not nervous/anxious.     Immunization History  Administered Date(s) Administered   Fluad Quad(high Dose 65+) 04/02/2022   Influenza Split 03/11/2009, 03/11/2010, 02/25/2011, 02/24/2019, 03/08/2020, 03/25/2021   Influenza, High Dose Seasonal PF 03/08/2015, 04/03/2017, 03/17/2018, 03/08/2020, 04/15/2023   Influenza,inj,Quad PF,6+ Mos 03/08/2016   Influenza,inj,quad, With Preservative 03/11/2017   Moderna Covid-19 Vaccine Bivalent Booster 70yrs & up 10/27/2021   Moderna SARS-COV2 Booster Vaccination 11/08/2020   Moderna Sars-Covid-2 Vaccination 06/15/2019, 07/13/2019, 04/19/2020, 02/27/2022   Pfizer Covid-19 Vaccine Bivalent Booster 62yrs & up 02/28/2021, 04/12/2022   Pneumococcal Conjugate-13 12/06/2014   Pneumococcal Polysaccharide-23 05/13/2001, 03/11/2009   Polio, Unspecified 02/02/1958   Tdap 11/21/2011   Zoster Recombinant(Shingrix) 07/18/2022, 10/19/2022   Pertinent  Health Maintenance Due  Topic Date Due   INFLUENZA VACCINE  Completed   DEXA SCAN  Completed      02/16/2022    9:45 AM 02/27/2022   11:41 AM 07/06/2022   10:04 AM 07/13/2022    3:55 PM 03/25/2023    4:51 PM  Fall Risk  Falls in the past year?   0 0 1  Was there an injury with Fall?   0 0 0  Fall Risk Category Calculator   0 0 1  (RETIRED) Patient Fall Risk Level High fall risk High fall risk     Patient at Risk for Falls Due to  History of fall(s) No Fall Risks No Fall Risks History of fall(s);Impaired balance/gait;Impaired mobility  Fall risk Follow up  Falls evaluation completed  Falls evaluation completed Falls evaluation completed;Falls prevention discussed;Education provided   Functional Status Survey:    Vitals:   04/24/23 1451 04/26/23 0940  BP: (!) 135/51 (!) 135/51  Pulse: 61   Resp: 16   Temp: (!) 97.3 F (36.3 C)   SpO2: 94%   Weight: 202 lb 6.4  oz (91.8 kg)   Height: 5\' 6"  (1.676 m)    Body  mass index is 32.67 kg/m. Physical Exam Vitals and nursing note reviewed.  Constitutional:      Comments: fatigue  HENT:     Head: Normocephalic and atraumatic.     Nose: Nose normal.     Mouth/Throat:     Mouth: Mucous membranes are moist.  Eyes:     Extraocular Movements: Extraocular movements intact.     Conjunctiva/sclera: Conjunctivae normal.     Pupils: Pupils are equal, round, and reactive to light.  Cardiovascular:     Rate and Rhythm: Normal rate. Rhythm irregular.     Heart sounds: Murmur heard.     Comments: No dorsalis pedis pulses felt from previous examination.  Pulmonary:     Effort: Pulmonary effort is normal.     Breath sounds: No wheezing or rales.  Chest:     Chest wall: No tenderness.  Abdominal:     General: Bowel sounds are normal.     Palpations: Abdomen is soft.     Tenderness: There is no abdominal tenderness.  Musculoskeletal:        General: Normal range of motion.     Cervical back: Normal range of motion and neck supple.     Right lower leg: Edema present.     Left lower leg: Edema present.     Comments: Trace -1+ edema LLE>RLE  Skin:    General: Skin is warm and dry.     Findings: Erythema present.     Comments: Mild dark erythema mid of R+L lower legs, chronic.     Neurological:     General: No focal deficit present.     Mental Status: She is alert and oriented to person, place, and time. Mental status is at baseline.     Gait: Gait abnormal.     Comments: 5/5 muscle strength in fingers.   Psychiatric:        Mood and Affect: Mood normal.        Behavior: Behavior normal.     Labs reviewed: Recent Labs    01/18/23 0000 01/26/23 0000 03/25/23 0000  NA 140 139 137  K 3.7 4.3 4.4  CL 104 103 101  CO2 28* 24* 28*  BUN 16 13 16   CREATININE 0.8 0.9 0.9  CALCIUM 9.1 9.6 9.2   Recent Labs    07/05/22 0000 01/18/23 0000 01/26/23 0000  AST 23 23 24   ALT 18 16 17   ALKPHOS 44 37  40  ALBUMIN 3.8 3.7 3.7   Recent Labs    01/18/23 0000 01/26/23 0000 03/25/23 0000  WBC 5.2 4.6 10.8  NEUTROABS 2,782.00 1,697.00 7,700.00  HGB 13.4 13.6 14.0  HCT 41 41 43  PLT 113* 121* 162   Lab Results  Component Value Date   TSH 4.38 01/26/2023   No results found for: "HGBA1C" Lab Results  Component Value Date   CHOL 193 05/13/2017   HDL 41 05/13/2017   LDLCALC 122 (H) 05/13/2017   TRIG 152 (H) 05/13/2017   CHOLHDL 4.7 (H) 05/13/2017    Significant Diagnostic Results in last 30 days:  CUP PACEART REMOTE DEVICE CHECK  Result Date: 04/12/2023 Scheduled remote reviewed. Normal device function.  Next remote 91 days. LA, CVRS   Assessment/Plan Fatigue fatigue, generalized weakness, denied nausea, vomiting, abd pain,  sore throat, congestion, cough, chest pressure/pain, palpitation, afebrile, negative COVID test today. CBC/diff,  CMP/eGFR11/13/24 wbc 7.1, Hgb 13.3, plt 110, neutrophils 53.7, Na 139, K 4.1, Bun 16, creat 0.86  Numbness of fingers of  both hands 01/11/23 family expressed concerning of hand and fingers tingling and numbness, desires checking TSH 4.38 01/26/23 and K 4.4 03/25/23, If Namenda is the culprit. The patient stated her tingling and numbness in hands and fingers has been the same, declined workup or neurology referral.   Senile dementia (HCC)  tolerated Namenda well, helped with her mood as well. MMSE 26/30 03/23/22  Paroxysmal atrial fibrillation (HCC) Heart rate is in control, on Metoprolol, Flecainide, Eliquis. Hgb 14 03/25/23  Hypertension Fluctuating in Bp, on Metoprolol, Furosemide,  f/u Cardiology, monitor Bp/P  Sinus node dysfunction (HCC) Pacemaker, f/u Cardiology  Congestive heart failure (HCC)  edema BLE, on Furosemide. EF 70-75% 09/19/21, 03/27/22 Venous US, negative DVT. 03/30/22 arterial US mild to moderate obstructive disease BLE  CKD (chronic kidney disease) stage 3, GFR 30-59 ml/min (HCC) Bun/creat 16/0.9 03/25/23  Esophageal  dysphagia nectar liquid.   Hypothyroidism  taking Levothyroxine, TSH 4.38 01/25/23  Osteoarthritis, multiple sites  takes Tylenol, ambulates with walker.      Family/ staff Communication: plan of care reviewed with the patient and charge nurse.   Labs/tests ordered: CBC/diff, CMP/eGFR stat  Time spend 40 minutes.

## 2023-04-24 NOTE — Assessment & Plan Note (Signed)
tolerated Namenda well, helped with her mood as well. MMSE 26/30 03/23/22

## 2023-04-24 NOTE — Progress Notes (Signed)
Remote pacemaker transmission.   

## 2023-04-24 NOTE — Assessment & Plan Note (Signed)
taking Levothyroxine, TSH 4.38 01/25/23

## 2023-04-24 NOTE — Assessment & Plan Note (Signed)
01/11/23 family expressed concerning of hand and fingers tingling and numbness, desires checking TSH 4.38 01/26/23 and K 4.4 03/25/23, If Namenda is the culprit. The patient stated her tingling and numbness in hands and fingers has been the same, declined workup or neurology referral.

## 2023-04-24 NOTE — Assessment & Plan Note (Addendum)
fatigue, generalized weakness, denied nausea, vomiting, abd pain,  sore throat, congestion, cough, chest pressure/pain, palpitation, afebrile, negative COVID test today. CBC/diff,  CMP/eGFR11/13/24 wbc 7.1, Hgb 13.3, plt 110, neutrophils 53.7, Na 139, K 4.1, Bun 16, creat 0.86

## 2023-04-24 NOTE — Assessment & Plan Note (Signed)
Bun/creat 16/0.9 03/25/23

## 2023-04-24 NOTE — Assessment & Plan Note (Signed)
takes Tylenol, ambulates with walker.

## 2023-04-24 NOTE — Assessment & Plan Note (Signed)
Pacemaker, f/u Cardiology 

## 2023-04-25 ENCOUNTER — Encounter: Payer: Self-pay | Admitting: Nurse Practitioner

## 2023-04-26 ENCOUNTER — Encounter: Payer: Self-pay | Admitting: Physician Assistant

## 2023-04-26 ENCOUNTER — Ambulatory Visit: Payer: Medicare Other | Attending: Physician Assistant | Admitting: Physician Assistant

## 2023-04-26 VITALS — BP 126/68 | HR 62 | Ht 66.0 in | Wt 207.0 lb

## 2023-04-26 DIAGNOSIS — I480 Paroxysmal atrial fibrillation: Secondary | ICD-10-CM | POA: Insufficient documentation

## 2023-04-26 DIAGNOSIS — I340 Nonrheumatic mitral (valve) insufficiency: Secondary | ICD-10-CM | POA: Diagnosis not present

## 2023-04-26 DIAGNOSIS — I5032 Chronic diastolic (congestive) heart failure: Secondary | ICD-10-CM | POA: Insufficient documentation

## 2023-04-26 DIAGNOSIS — I10 Essential (primary) hypertension: Secondary | ICD-10-CM | POA: Diagnosis not present

## 2023-04-26 DIAGNOSIS — I350 Nonrheumatic aortic (valve) stenosis: Secondary | ICD-10-CM | POA: Diagnosis not present

## 2023-04-26 NOTE — Patient Instructions (Signed)
Medication Instructions:   Increase Lasix one (1) tablet by mouth ( 20 mg) twice daily X 3 days.   Increase Kdur one (1) tablet by mouth ( 10 mEq ) twice daily X 3 days.   *If you need a refill on your cardiac medications before your next appointment, please call your pharmacy*   Lab Work:  Your physician recommends that you [facility] for lab work [facility]   If you have labs (blood work) drawn today and your tests are completely normal, you will receive your results only by: MyChart Message (if you have MyChart) OR A paper copy in the mail If you have any lab test that is abnormal or we need to change your treatment, we will call you to review the results.   Testing/Procedures:  Your physician has requested that you have an echocardiogram. Echocardiography is a painless test that uses sound waves to create images of your heart. It provides your doctor with information about the size and shape of your heart and how well your heart's chambers and valves are working. This procedure takes approximately one hour. There are no restrictions for this procedure. Please do NOT wear cologne, perfume, or lotions (deodorant is allowed). Please arrive 15 minutes prior to your appointment time.  Please note: We ask at that you not bring children with you during ultrasound (echo/ vascular) testing. Due to room size and safety concerns, children are not allowed in the ultrasound rooms during exams. Our front office staff cannot provide observation of children in our lobby area while testing is being conducted. An adult accompanying a patient to their appointment will only be allowed in the ultrasound room at the discretion of the ultrasound technician under special circumstances. We apologize for any inconvenience.    Follow-Up: At [facility], you and your health needs are our priority.  As part of our continuing mission to provide you with exceptional heart care, we have  created designated Provider Care Teams.  These Care Teams include your primary Cardiologist (physician) and Advanced Practice Providers (APPs -  Physician Assistants and Nurse Practitioners) who all work together to provide you with the care you need, when you need it.  We recommend signing up for the patient portal called "MyChart".  Sign up information is provided on this After Visit Summary.  MyChart is used to connect with patients for Virtual Visits (Telemedicine).  Patients are able to view lab/test results, encounter notes, upcoming appointments, etc.  Non-urgent messages can be sent to your provider as well.   To learn more about what you can do with MyChart, go to [email or web address].    Your next appointment:   6 month(s)  Provider:   Dr. Lynnette Caffey

## 2023-05-02 DIAGNOSIS — I350 Nonrheumatic aortic (valve) stenosis: Secondary | ICD-10-CM | POA: Diagnosis not present

## 2023-05-03 LAB — BASIC METABOLIC PANEL: EGFR: 62

## 2023-05-13 NOTE — Progress Notes (Unsigned)
Cardiology Office Note:  .   Date:  05/13/2023  ID:  [name], DOB [birth year], MRN [id number] PCP: Venita Sheffield, MD  [facility] Providers Cardiologist:  [name], MD (Inactive) Electrophysiologist:  [name], MD (Inactive) {  History of Present Illness: .   [name] is a 89 y.o. female w/PMHx of AFib, symptomatic bradycardia w/PPM, HTN, HLD, VHD w/ AS  She saw Dr. Johney Frame 11/01/21, more afib of late, anxiety provoking though not overtly symptomatic otherwise, burden 1% and maintained on flecainide  Saw A. Tillery 03/09/22, had a recent hospitalization then for sepsis/cellulitis, UTI.  Feeling better  Following regularly with cardiology team, last with D. Dunn, PA-C on 04/26/23, living at ALF, walking regularly with her walker, and reportedly doing great.  Edema waxes/wanes. Planned serial echos to monitor her AS (previously evaluated by TAVR team, mod As recommended monitoring), transition to Dr. Lynnette Caffey.  Today's visit is scheduled as a visit for pacer check  ROS:   She is accompanied by her daughter Resides at [facility] in assisted living care Likes to play the piano, reports she participates in the ALF activities Denies any concerns, symptoms No CP, palpitations or cardiac awareness No SOB No syncope or near syncope No bleeding or signs of bleeding   Device information Abbott dual chamber PPM implanted 04/19/2008, gen change 08/08/2015  Arrhythmia/AAD hx Flecainide started 2013  Studies Reviewed: Marland Kitchen    EKG done today and reviewed by myself:  AP/VS 61bpm, PR , QRS , QTc , inf/lat T changes unchanged from prior  DEVICE interrogation done today and reviewed by myself Battery and lead measurements are good <1% AF burden 2 episodes, both <1 minute No HVR episodes  09/17/2022: TTE 1. Left ventricular ejection fraction, by estimation, is 70 to 75%. The  left ventricle has hyperdynamic function. The left ventricle has no   regional wall motion abnormalities. There is severe left ventricular  hypertrophy of the septal segment. Left  ventricular diastolic parameters are consistent with Grade I diastolic  dysfunction (impaired relaxation). The average left ventricular global  longitudinal strain is -15.1 %. The global longitudinal strain is normal.   2. Right ventricular systolic function is normal. The right ventricular  size is normal. There is normal pulmonary artery systolic pressure. The  estimated right ventricular systolic pressure is 34.4 mmHg.   3. The mitral valve is normal in structure. Mild mitral valve  regurgitation. No evidence of mitral stenosis. Severe mitral annular  calcification.   4. The aortic valve is tricuspid. There is severe calcifcation of the  aortic valve. There is severe thickening of the aortic valve. Aortic valve  regurgitation is not visualized. Moderate to severe aortic valve stenosis.  Aortic valve area, by VTI  measures 0.99 cm. Aortic valve mean gradient measures 30.0 mmHg. Aortic  valve Vmax measures 3.58 m/s.   5. The inferior vena cava is normal in size with greater than 50%  respiratory variability, suggesting right atrial pressure of 3 mmHg.   Comparison(s): Prior images reviewed side by side. Prior AV mean gradient  26 mmHg 2023.    Risk Assessment/Calculations:    Physical Exam:   VS:  There were no vitals taken for this visit.   Wt Readings from Last 3 Encounters:  04/26/23 207 lb (93.9 kg)  04/24/23 202 lb 6.4 oz (91.8 kg)  04/09/23 205 lb 8 oz (93.2 kg)    GEN: Well nourished, well developed in no acute distress NECK: No JVD; No carotid  bruits CARDIAC: RRR, 3/6 SM, rubs, gallops RESPIRATORY:  CTA b/l without rales, wheezing or rhonchi  ABDOMEN: Soft, non-tender, non-distended EXTREMITIES:  No edema; No deformity   PPM site: is stable, no thinning, fluctuation, tethering  ASSESSMENT AND PLAN: .    paroxysmal AFib CHA2DS2Vasc is 4, on Eliquis,  appropriately dosed for weight/creat <1 % burden Flecainide and metoprolol, w/stable intervals  PPM intact function Cybersecurity upgrade completed  VHD Mod-severe AS No symptoms to suggest clinical changes C/w Dr. Kris Mouton  Secondary hypercoagulable state 2/2 AFib   Dispo: remotes as usual, back in clinic with Dr. Lalla Brothers in a year again, sooner if needed  Signed, Sheilah Pigeon, PA-C

## 2023-05-15 ENCOUNTER — Ambulatory Visit: Payer: Medicare Other | Attending: Physician Assistant | Admitting: Physician Assistant

## 2023-05-15 ENCOUNTER — Encounter: Payer: Self-pay | Admitting: Physician Assistant

## 2023-05-15 VITALS — BP 138/60 | HR 61 | Ht 63.0 in | Wt 207.2 lb

## 2023-05-15 DIAGNOSIS — I480 Paroxysmal atrial fibrillation: Secondary | ICD-10-CM | POA: Insufficient documentation

## 2023-05-15 DIAGNOSIS — Z950 Presence of cardiac pacemaker: Secondary | ICD-10-CM | POA: Insufficient documentation

## 2023-05-15 DIAGNOSIS — I495 Sick sinus syndrome: Secondary | ICD-10-CM | POA: Diagnosis not present

## 2023-05-15 DIAGNOSIS — I350 Nonrheumatic aortic (valve) stenosis: Secondary | ICD-10-CM | POA: Insufficient documentation

## 2023-05-15 DIAGNOSIS — D6869 Other thrombophilia: Secondary | ICD-10-CM | POA: Diagnosis not present

## 2023-05-15 LAB — CUP PACEART INCLINIC DEVICE CHECK
Battery Remaining Longevity: 39 mo
Battery Voltage: 2.95 V
Brady Statistic RA Percent Paced: 97 %
Brady Statistic RV Percent Paced: 1.9 %
Date Time Interrogation Session: 20241204180411
Implantable Lead Connection Status: 753985
Implantable Lead Connection Status: 753985
Implantable Lead Implant Date: 20091109
Implantable Lead Implant Date: 20091109
Implantable Lead Location: 753859
Implantable Lead Location: 753860
Implantable Lead Model: 350
Implantable Lead Serial Number: 24891460
Implantable Lead Serial Number: 28411861
Implantable Pulse Generator Implant Date: 20170227
Lead Channel Impedance Value: 1312.5 Ohm
Lead Channel Impedance Value: 512.5 Ohm
Lead Channel Pacing Threshold Amplitude: 1 V
Lead Channel Pacing Threshold Amplitude: 1 V
Lead Channel Pacing Threshold Amplitude: 1 V
Lead Channel Pacing Threshold Amplitude: 1 V
Lead Channel Pacing Threshold Pulse Width: 0.4 ms
Lead Channel Pacing Threshold Pulse Width: 0.4 ms
Lead Channel Pacing Threshold Pulse Width: 0.4 ms
Lead Channel Pacing Threshold Pulse Width: 0.4 ms
Lead Channel Sensing Intrinsic Amplitude: 0.8 mV
Lead Channel Sensing Intrinsic Amplitude: 12 mV
Lead Channel Setting Pacing Amplitude: 2 V
Lead Channel Setting Pacing Amplitude: 2.5 V
Lead Channel Setting Pacing Pulse Width: 0.4 ms
Lead Channel Setting Sensing Sensitivity: 2 mV
Pulse Gen Model: 2240
Pulse Gen Serial Number: 7885781

## 2023-05-15 NOTE — Patient Instructions (Signed)
Medication Instructions:   Your physician recommends that you continue on your current medications as directed. Please refer to the Current Medication list given to you today.   *If you need a refill on your cardiac medications before your next appointment, please call your pharmacy*   Lab Work:  [place]   If you have labs (blood work) drawn today and your tests are completely normal, you will receive your results only by: [place] (if you have MyChart) OR A paper copy in the mail If you have any lab test that is abnormal or we need to change your treatment, we will call you to review the results.   Testing/Procedures: NONE ORDERED  TODAY     Follow-Up: At [facility], you and your health needs are our priority.  As part of our continuing mission to provide you with exceptional heart care, we have created designated Provider Care Teams.  These Care Teams include your primary Cardiologist (physician) and Advanced Practice Providers (APPs -  Physician Assistants and Nurse Practitioners) who all work together to provide you with the care you need, when you need it.  We recommend signing up for the patient portal called "MyChart".  Sign up information is provided on this After Visit Summary.  MyChart is used to connect with patients for Virtual Visits (Telemedicine).  Patients are able to view lab/test results, encounter notes, upcoming appointments, etc.  Non-urgent messages can be sent to your provider as well.   To learn more about what you can do with MyChart, go to [email or web address].    Your next appointment:   1 year(s)  Provider:   You may see Dr Quentin Ore  or one of the following Advanced Practice Providers on your designated Care Team:   Tommye Standard, [place]   Other Instructions

## 2023-05-24 ENCOUNTER — Non-Acute Institutional Stay: Payer: Self-pay | Admitting: Nurse Practitioner

## 2023-05-24 ENCOUNTER — Encounter: Payer: Self-pay | Admitting: Nurse Practitioner

## 2023-05-24 DIAGNOSIS — I4819 Other persistent atrial fibrillation: Secondary | ICD-10-CM

## 2023-05-24 DIAGNOSIS — K649 Unspecified hemorrhoids: Secondary | ICD-10-CM | POA: Diagnosis not present

## 2023-05-24 DIAGNOSIS — I509 Heart failure, unspecified: Secondary | ICD-10-CM | POA: Diagnosis not present

## 2023-05-24 DIAGNOSIS — R200 Anesthesia of skin: Secondary | ICD-10-CM

## 2023-05-24 DIAGNOSIS — K625 Hemorrhage of anus and rectum: Secondary | ICD-10-CM | POA: Diagnosis not present

## 2023-05-24 DIAGNOSIS — I10 Essential (primary) hypertension: Secondary | ICD-10-CM | POA: Diagnosis not present

## 2023-05-24 DIAGNOSIS — F0390 Unspecified dementia without behavioral disturbance: Secondary | ICD-10-CM

## 2023-05-24 LAB — CBC AND DIFFERENTIAL
HCT: 41 (ref 36–46)
Hemoglobin: 13.5 (ref 12.0–16.0)
Neutrophils Absolute: 4355
Platelets: 136 10*3/uL — AB (ref 150–400)
WBC: 7.6

## 2023-05-24 LAB — CBC: RBC: 4.17 (ref 3.87–5.11)

## 2023-05-24 NOTE — Assessment & Plan Note (Signed)
BRBPR last night, noted in toilet, denied lightheadedness, chest pain, palpitation, nausea, vomiting, queasy stomach, abd pain, constipation, rectal pain, or dysuria. Afebrile, appetite and energy level at her baseline. FOBT positive.  Stat CBC/diff, CMP/eGFR, adding Pantoprazole for GI protection, may hold Eliquis if recurs.

## 2023-05-24 NOTE — Assessment & Plan Note (Signed)
edema BLE, on Furosemide. EF 70-75% 09/19/21, 03/27/22 Venous US, negative DVT. 03/30/22 arterial US mild to moderate obstructive disease BLE 

## 2023-05-24 NOTE — Assessment & Plan Note (Signed)
Senile dementia, tolerated Namenda well, helped with her mood as well. MMSE 26/30 03/23/22

## 2023-05-24 NOTE — Assessment & Plan Note (Signed)
01/11/23 family expressed concerning of hand and fingers tingling and numbness, desires checking TSH 4.38 01/26/23 and K 4.4 03/25/23, If Namenda is the culprit. The patient stated her tingling and numbness in hands and fingers has been the same, declined workup or neurology referral.  Chronic numbness of R+L hands, has been using fingerless gloves which helped, declined workup, neurology referral, or medication

## 2023-05-24 NOTE — Assessment & Plan Note (Signed)
Heart rate is in control, on Metoprolol, Flecainide, Eliquis. Hgb 14 03/25/23. AS moderate

## 2023-05-24 NOTE — Progress Notes (Unsigned)
Location:   [facility] Room Number: 824 Place of Service:  ALF (13) Provider: Arna Snipe Elford Evilsizer NP  Venita Sheffield, MD  Patient Care Team: Venita Sheffield, MD as PCP - General (Internal Medicine) [name], MD (Inactive) as PCP - Electrophysiology (Cardiology) [name], MD (Inactive) as PCP - Cardiology (Cardiology) Lurline Idol, FNP (Hospice and Palliative Medicine)  Extended Emergency Contact Information Primary Emergency Contact: [facility] Phone: [phone] Mobile Phone: [phone] Relation: Daughter Interpreter needed? No Secondary Emergency Contact: Pearman,Karen Address: [address]          [place], [place] [address] [place] Home Phone: [phone] Mobile Phone: [phone] Relation: Daughter  Code Status: DNR Goals of care: Advanced Directive information    04/24/2023    2:55 PM  Advanced Directives  Does Patient Have a Medical Advance Directive? Yes  Type of Estate agent of [place];Out of facility DNR (pink MOST or yellow form)  Does patient want to make changes to medical advance directive? No - Patient declined  Copy of Healthcare Power of Attorney in Chart? Yes - validated most recent copy scanned in chart (See row information)  Pre-existing out of facility DNR order (yellow form or pink MOST form) Yellow form placed in chart (order not valid for inpatient use)     Chief Complaint  Patient presents with  . Acute Visit    BRBPR last night    HPI:  Pt is a 89 y.o. female seen today for an acute visit for BRBPR last night, noted in toilet, denied lightheadedness, chest pain, palpitation, nausea, vomiting, queasy stomach, abd pain, constipation, rectal pain, or dysuria. Afebrile, appetite and energy level at her baseline. FOBT positive.    01/11/23 family expressed concerning of hand and fingers tingling and numbness, desires checking TSH 4.38 01/26/23 and K 4.4 03/25/23, If  Namenda is the culprit. The patient stated her tingling and numbness in hands and fingers has been the same, declined workup or neurology referral.                           Chronic numbness of R+L hands, has been using fingerless gloves which helped, declined workup, neurology referral, or medication             Senile dementia, tolerated Namenda well, helped with her mood as well. MMSE 26/30 03/23/22             Afib, on Metoprolol, Flecainide, Eliquis. Hgb 14 03/25/23             AS moderate             HTN on Metoprolol, Furosemide,  f/u Cardiology.              Pacemaker, f/u Cardiology             CHF, edema BLE, on Furosemide. EF 70-75% 09/19/21, 03/27/22 Venous US, negative DVT. 03/30/22 arterial US mild to moderate obstructive disease BLE             CKD Bun/creat 16/0.9 03/25/23             Dysphagia, nectar liquid.              Hypothyroidism, taking Levothyroxine, TSH 4.38 01/25/23             OA, takes Tylenol, ambulates with walker.              CT head 02/12/22  Mild age-related atrophy and chronic microvascular ischemic changes.             Hypokalemia, K 4.3 01/25/23      Past Medical History:  Diagnosis Date  . Aortic stenosis 03/08/2016  . Brady-tachy syndrome (HCC)    a. Biotronik dual chamber PPM implanted 2009 b. gen change to STJ dual chamber PPM 2017  . Ejection fraction    EF 70%, echo, October, 2009  //   EF 55-60%, septal dyssynergy consistent with a paced rhythm, mild to moderate mitral regurgitation, echo, November, 2015   . GERD (gastroesophageal reflux disease) 04/28/2014   Episodes November, 2015 with fluid refluxing from her esophagus.   . Hair loss    Patient questioned Coumadin, changed toPradaxa  . Hypertension   . Hypothyroidism   . Lower extremity edema 01/22/2018  . Mitral regurgitation 05/03/2014   Mild-to-moderate mitral regurgitation, echo, November, 2015   . Osteoarthritis of left knee 10/25/2017  . Paroxysmal atrial fibrillation (HCC)    Episodes  rapid atrial fib noted by pacemaker interrogation, August, 2011, diltiazem added, patient improved. Patient continues on Rythmol  //   Changed to flecainide 2013  //   flecainide level checked in 2013, good level   . Persistent atrial fibrillation [facility])    Past Surgical History:  Procedure Laterality Date  . ABDOMINAL HYSTERECTOMY    . EP IMPLANTABLE DEVICE N/A 08/08/2015   Generator change with SJM [employer] DR PPM by Dr Johney Frame  . JOINT REPLACEMENT    . PACEMAKER PLACEMENT  2009        Allergies  Allergen Reactions  . Morphine And Codeine Other (See Comments)    Patient states that she went "crazy" with this  . Penicillins Swelling and Rash    Has patient had a PCN reaction causing immediate rash, facial/tongue/throat swelling, SOB or lightheadedness with hypotension: {Yes/No:30480221} Has patient had a PCN reaction causing severe rash involving mucus membranes or skin necrosis: {Yes/No:30480221} Has patient had a PCN reaction that required hospitalization {Yes/No:30480221} Has patient had a PCN reaction occurring within the last 10 years: {Yes/No:30480221} If all of the above answers are "NO", then may proceed with Cephalosporin use.  . Lasix [Furosemide] Itching    Patient stated it made her itch all over and dry.  . Aspirin     Pacemaker  . Clindamycin/Lincomycin Other (See Comments)    Any meds with mycin in the name Unknown reaction  . Crestor [Rosuvastatin]     Other reaction(s): muscle pain  . Diltiazem     Skin discoloration, lower extremity swelling  . Erythromycin Other (See Comments)    Any meds with mycin in the name Unknown reaction  . Lisinopril Other (See Comments)    REACTION: Cough  . Metronidazole     Other reaction(s): mouth sores, tongue swelling  . Morphine   . Penicillins Cross Reactors Other (See Comments)    Any meds with mycin in the name Unknown reaction    Allergies as of 05/24/2023       Reactions   Morphine And Codeine Other (See Comments)    Patient states that she went "crazy" with this   Penicillins Swelling, Rash   Has patient had a PCN reaction causing immediate rash, facial/tongue/throat swelling, SOB or lightheadedness with hypotension: {Yes/No:30480221} Has patient had a PCN reaction causing severe rash involving mucus membranes or skin necrosis: {Yes/No:30480221} Has patient had a PCN reaction that required hospitalization {Yes/No:30480221} Has patient had a PCN reaction occurring within the  last 10 years: {Yes/No:30480221} If all of the above answers are "NO", then may proceed with Cephalosporin use.   Lasix [furosemide] Itching   Patient stated it made her itch all over and dry.   Aspirin    Pacemaker   Clindamycin/lincomycin Other (See Comments)   Any meds with mycin in the name Unknown reaction   Crestor [rosuvastatin]    Other reaction(s): muscle pain   Diltiazem    Skin discoloration, lower extremity swelling   Erythromycin Other (See Comments)   Any meds with mycin in the name Unknown reaction   Lisinopril Other (See Comments)   REACTION: Cough   Metronidazole    Other reaction(s): mouth sores, tongue swelling   Morphine    Penicillins Cross Reactors Other (See Comments)   Any meds with mycin in the name Unknown reaction        Medication List        Accurate as of May 24, 2023  3:18 PM. If you have any questions, ask your nurse or doctor.          acetaminophen 500 MG tablet Commonly known as: TYLENOL Take 1,000 mg by mouth in the morning and at bedtime.   acetaminophen 325 MG tablet Commonly known as: TYLENOL Take 650 mg by mouth every 6 (six) hours as needed.   betamethasone (augmented) 0.05 % gel Commonly known as: DIPROLENE Apply topically daily.  Apply to scalp topically every 12 hours as needed for psoriasis related to PSORIASIS, UNSPECIFIED (L40.9) apply to affected area of scalp   Calcium 600+D Plus Minerals 600-400 MG-UNIT Tabs Take 1 tablet by mouth daily.    Eliquis 5 MG Tabs tablet Generic drug: apixaban TAKE ONE TABLET BY MOUTH TWICE A DAY   flecainide 50 MG tablet Commonly known as: TAMBOCOR Take 1 tablet (50 mg total) by mouth 2 (two) times daily.   furosemide 20 MG tablet Commonly known as: LASIX TAKE ONE TABLET BY MOUTH EVERY DAY   hydrocortisone 2.5 % ointment as needed (rash). Apply to affected areas topically as needed for Rashy/itchy areas two times daily-MAY SELF ADMINISTER-per pt request   Ipratropium-Albuterol 20-100 MCG/ACT Aers respimat Commonly known as: COMBIVENT Inhale 1 puff into the lungs every 6 (six) hours.   levothyroxine 100 MCG tablet Commonly known as: SYNTHROID Take 1 tablet (100 mcg total) by mouth every morning.   metoprolol succinate 25 MG 24 hr tablet Commonly known as: TOPROL-XL Take 3 tablets (75 mg total) by mouth daily.   Multivitamin Adult Tabs Take 1 tablet by mouth daily.   Namenda 10 MG tablet Generic drug: memantine TAKE ONE TABLET BY MOUTH TWICE A DAY FOR COGNITIVE DECLINE   OCUVITE PO Take 1 capsule by mouth daily.   potassium chloride 10 MEQ tablet Commonly known as: KLOR-CON M Take 1 tablet (10 mEq total) by mouth daily.   Prevagen 10 MG Caps Generic drug: Apoaequorin Take 10 mg by mouth daily.   Refresh Relieva PF 0.5-1 % Soln Generic drug: Carboxymethylcell-Glycerin PF Apply 1 drop to eye in the morning and at bedtime. for relieve discomfort   sodium chloride 5 % ophthalmic ointment Commonly known as: MURO 128 Place 1 Application into both eyes at bedtime.   sodium chloride 5 % ophthalmic solution Commonly known as: Muro 128 Place 1 drop into both eyes in the morning and at bedtime. for decrease swelling Wait 1 hour between these and Refresh   Voltaren 1 % Gel Generic drug: diclofenac Sodium Apply 1 g topically 2 (  two) times daily as needed.        Review of Systems  Constitutional:  Negative for appetite change, fatigue and fever.  HENT:  Positive for  hearing loss and trouble swallowing. Negative for congestion, rhinorrhea, sinus pressure and sore throat.   Eyes:  Negative for visual disturbance.  Respiratory:  Negative for cough, chest tightness, shortness of breath and wheezing.   Cardiovascular:  Positive for leg swelling. Negative for chest pain and palpitations.  Gastrointestinal:  Positive for anal bleeding and blood in stool. Negative for abdominal pain, constipation, nausea, rectal pain and vomiting.  Genitourinary:  Negative for dysuria and frequency.  Musculoskeletal:  Positive for arthralgias and gait problem.       Knees, L>R, intermittent in nature.   Skin:  Negative for color change.       Chronic venous insufficiency skin changes: mild erythema in mid of R+L   Neurological:  Positive for numbness. Negative for tremors, speech difficulty, weakness and headaches.       Memory lapses. chronic numbness in fingers.   Psychiatric/Behavioral:  Negative for confusion and sleep disturbance. The patient is not nervous/anxious.     Immunization History  Administered Date(s) Administered  . Fluad Quad(high Dose 65+) 04/02/2022  . Influenza Split 03/11/2009, 03/11/2010, 02/25/2011, 02/24/2019, 03/08/2020, 03/25/2021  . Influenza, High Dose Seasonal PF 03/08/2015, 04/03/2017, 03/17/2018, 03/08/2020, 04/15/2023  . Influenza,inj,Quad PF,6+ Mos 03/08/2016  . Influenza,inj,quad, With Preservative 03/11/2017  . Moderna Covid-19 Vaccine Bivalent Booster 39yrs & up 10/27/2021  . Moderna SARS-COV2 Booster Vaccination 11/08/2020  . Moderna Sars-Covid-2 Vaccination 06/15/2019, 07/13/2019, 04/19/2020, 02/27/2022  . Research officer, trade union 54yrs & up 02/28/2021, 04/12/2022  . Pneumococcal Conjugate-13 12/06/2014  . Pneumococcal Polysaccharide-23 05/13/2001, 03/11/2009  . Polio, Unspecified 02/02/1958  . Tdap 11/21/2011  . Zoster Recombinant(Shingrix) 07/18/2022, 10/19/2022   Pertinent  Health Maintenance Due  Topic Date Due   . INFLUENZA VACCINE  Completed  . DEXA SCAN  Completed      02/16/2022    9:45 AM 02/27/2022   11:41 AM 07/06/2022   10:04 AM 07/13/2022    3:55 PM 03/25/2023    4:51 PM  Fall Risk  Falls in the past year?   0 0 1  Was there an injury with Fall?   0 0 0  Fall Risk Category Calculator   0 0 1  (RETIRED) Patient Fall Risk Level High fall risk High fall risk     Patient at Risk for Falls Due to  History of fall(s) No Fall Risks No Fall Risks History of fall(s);Impaired balance/gait;Impaired mobility  Fall risk Follow up  Falls evaluation completed  Falls evaluation completed Falls evaluation completed;Falls prevention discussed;Education provided   Functional Status Survey:    Vitals:   05/24/23 1131  BP: 132/60  Pulse: 64  Resp: 18  Temp: 97.9 F (36.6 C)  SpO2: 95%  Weight: 206 lb (93.4 kg)   Body mass index is 36.49 kg/m. Physical Exam Vitals and nursing note reviewed.  Constitutional:      Comments: fatigue  HENT:     Head: Normocephalic and atraumatic.     Nose: Nose normal.     Mouth/Throat:     Mouth: Mucous membranes are moist.  Eyes:     Extraocular Movements: Extraocular movements intact.     Conjunctiva/sclera: Conjunctivae normal.     Pupils: Pupils are equal, round, and reactive to light.  Cardiovascular:     Rate and Rhythm: Normal rate. Rhythm irregular.  Heart sounds: Murmur heard.     Comments: No dorsalis pedis pulses felt from previous examination.  Pulmonary:     Effort: Pulmonary effort is normal.     Breath sounds: No rales.  Abdominal:     General: Bowel sounds are normal.     Palpations: Abdomen is soft.     Tenderness: There is no abdominal tenderness.  Genitourinary:    Rectum: Guaiac result positive.     Comments: External hemorrhoids.  Musculoskeletal:        General: Normal range of motion.     Cervical back: Normal range of motion and neck supple.     Right lower leg: Edema present.     Left lower leg: Edema present.      Comments: Trace -1+ edema LLE>RLE  Skin:    General: Skin is warm and dry.     Findings: Erythema present.     Comments: Mild dark erythema mid of R+L lower legs, chronic.     Neurological:     General: No focal deficit present.     Mental Status: She is alert and oriented to person, place, and time. Mental status is at baseline.     Gait: Gait abnormal.     Comments: 5/5 muscle strength in fingers.   Psychiatric:        Mood and Affect: Mood normal.        Behavior: Behavior normal.    Labs reviewed: Recent Labs    01/18/23 0000 01/26/23 0000 03/25/23 0000  NA 140 139 137  K 3.7 4.3 4.4  CL 104 103 101  CO2 28* 24* 28*  BUN 16 13 16   CREATININE 0.8 0.9 0.9  CALCIUM 9.1 9.6 9.2   Recent Labs    07/05/22 0000 01/18/23 0000 01/26/23 0000  AST 23 23 24   ALT 18 16 17   ALKPHOS 44 37 40  ALBUMIN 3.8 3.7 3.7   Recent Labs    01/18/23 0000 01/26/23 0000 03/25/23 0000  WBC 5.2 4.6 10.8  NEUTROABS 2,782.00 1,697.00 7,700.00  HGB 13.4 13.6 14.0  HCT 41 41 43  PLT 113* 121* 162   Lab Results  Component Value Date   TSH 4.38 01/26/2023   No results found for: "HGBA1C" Lab Results  Component Value Date   CHOL 193 05/13/2017   HDL 41 05/13/2017   LDLCALC 122 (H) 05/13/2017   TRIG 152 (H) 05/13/2017   CHOLHDL 4.7 (H) 05/13/2017    Significant Diagnostic Results in last 30 days:  CUP PACEART INCLINIC DEVICE CHECK Result Date: 05/15/2023 Pacemaker check in clinic. Normal device function. Thresholds, sensing, impedance's consistent with previous measurements. Device programmed to maximize longevity. 2 brief PATs, no high ventricular rates noted. Device programmed at appropriate safety margins. Histogram distribution appropriate for patient activity level. Device programmed to optimize intrinsic conduction. Estimated longevity _3.2 years_. Patient enrolled in remote follow-up. Patient education completed.   Assessment/Plan: BRBPR (bright red blood per rectum) BRBPR  last night, noted in toilet, denied lightheadedness, chest pain, palpitation, nausea, vomiting, queasy stomach, abd pain, constipation, rectal pain, or dysuria. Afebrile, appetite and energy level at her baseline. FOBT positive.  Stat CBC/diff, CMP/eGFR, adding Pantoprazole for GI protection, may hold Eliquis if recurs.   Numbness of fingers of both hands 01/11/23 family expressed concerning of hand and fingers tingling and numbness, desires checking TSH 4.38 01/26/23 and K 4.4 03/25/23, If Namenda is the culprit. The patient stated her tingling and numbness in hands and fingers has been  the same, declined workup or neurology referral.  Chronic numbness of R+L hands, has been using fingerless gloves which helped, declined workup, neurology referral, or medication  Senile dementia (HCC) Senile dementia, tolerated Namenda well, helped with her mood as well. MMSE 26/30 03/23/22  Persistent atrial fibrillation (HCC) Heart rate is in control, on Metoprolol, Flecainide, Eliquis. Hgb 14 03/25/23. AS moderate  Hypertension Blood pressure is controlled,  on Metoprolol, Furosemide,  f/u Cardiology.              Congestive heart failure (HCC) edema BLE, on Furosemide. EF 70-75% 09/19/21, 03/27/22 Venous US, negative DVT. 03/30/22 arterial US mild to moderate obstructive disease BLE    Family/ staff Communication: plan of care reviewed with the patient and charge nurse.   Labs/tests ordered: CBC/diff, CMP/eGFR  Time spend 40 minutes.

## 2023-05-24 NOTE — Assessment & Plan Note (Signed)
Blood pressure is controlled,  on Metoprolol, Furosemide,  f/u Cardiology.

## 2023-05-24 NOTE — Assessment & Plan Note (Signed)
taking Levothyroxine, TSH 4.38 01/25/23

## 2023-05-27 ENCOUNTER — Encounter: Payer: Self-pay | Admitting: Nurse Practitioner

## 2023-05-27 ENCOUNTER — Non-Acute Institutional Stay: Payer: Self-pay | Admitting: Nurse Practitioner

## 2023-05-27 DIAGNOSIS — K2900 Acute gastritis without bleeding: Secondary | ICD-10-CM | POA: Insufficient documentation

## 2023-05-27 DIAGNOSIS — F0390 Unspecified dementia without behavioral disturbance: Secondary | ICD-10-CM | POA: Diagnosis not present

## 2023-05-27 DIAGNOSIS — N1831 Chronic kidney disease, stage 3a: Secondary | ICD-10-CM

## 2023-05-27 DIAGNOSIS — I4819 Other persistent atrial fibrillation: Secondary | ICD-10-CM

## 2023-05-27 DIAGNOSIS — K625 Hemorrhage of anus and rectum: Secondary | ICD-10-CM | POA: Diagnosis not present

## 2023-05-27 DIAGNOSIS — I509 Heart failure, unspecified: Secondary | ICD-10-CM

## 2023-05-27 DIAGNOSIS — I10 Essential (primary) hypertension: Secondary | ICD-10-CM

## 2023-05-27 DIAGNOSIS — R200 Anesthesia of skin: Secondary | ICD-10-CM

## 2023-05-27 NOTE — Assessment & Plan Note (Signed)
taking Levothyroxine, TSH 4.38 01/25/23

## 2023-05-27 NOTE — Assessment & Plan Note (Signed)
edema BLE, on Furosemide. EF 70-75% 09/19/21, 03/27/22 Venous US, negative DVT. 03/30/22 arterial US mild to moderate obstructive disease BLE 

## 2023-05-27 NOTE — Assessment & Plan Note (Signed)
BRBPR 05/23/23, FOBT positive 05/24/23, noted hemorrhoids, placed PPI, wbc 7.6, Hgb 13.5, plt 136, neutrophils 57.3 05/24/23

## 2023-05-27 NOTE — Assessment & Plan Note (Addendum)
nausea, vomiting, diarrhea x 1 days, noted generalized weakness and poor appetite,   denied abd pain, she is afebrile.  COVID test 05/27/23 may obtain lipase, C-diff Toxin A/B x3 if GI symptoms persist, Zofran 4mg  q6hr prn x 24 hours available to her, observe.

## 2023-05-27 NOTE — Assessment & Plan Note (Signed)
tolerated Namenda well, helped with her mood as well. MMSE 26/30 03/23/22

## 2023-05-27 NOTE — Progress Notes (Signed)
Location:   [facility] Room Number: 824 Place of Service:  ALF (13) Provider: Arna Snipe Mavrik Bynum NP  Venita Sheffield, MD  Patient Care Team: Venita Sheffield, MD as PCP - General (Internal Medicine) [name], MD (Inactive) as PCP - Electrophysiology (Cardiology) [name], MD (Inactive) as PCP - Cardiology (Cardiology) Lurline Idol, FNP (Hospice and Palliative Medicine)  Extended Emergency Contact Information Primary Emergency Contact: [facility] Phone: [phone] Mobile Phone: [phone] Relation: Daughter Interpreter needed? No Secondary Emergency Contact: Pearman,Karen Address: [address]          [place], [place] [address] [place] Home Phone: [phone] Mobile Phone: [phone] Relation: Daughter  Code Status: DNR Goals of care: Advanced Directive information    04/24/2023    2:55 PM  Advanced Directives  Does Patient Have a Medical Advance Directive? Yes  Type of Estate agent of [place];Out of facility DNR (pink MOST or yellow form)  Does patient want to make changes to medical advance directive? No - Patient declined  Copy of Healthcare Power of Attorney in Chart? Yes - validated most recent copy scanned in chart (See row information)  Pre-existing out of facility DNR order (yellow form or pink MOST form) Yellow form placed in chart (order not valid for inpatient use)     Chief Complaint  Patient presents with   Acute Visit    Nausea, vomiting, diarrhea    HPI:  Pt is a 89 y.o. female seen today for an acute visit for nausea, vomiting, diarrhea x 1 days, noted generalized weakness and poor appetite, denied abd pain, she is afebrile.     BRBPR 05/23/23, FOBT positive 05/24/23, noted hemorrhoids, placed PPI, wbc 7.6, Hgb 13.5, plt 136, neutrophils 57.3 05/24/23              01/11/23 family expressed concerning of hand and fingers tingling and numbness, desires checking TSH  4.38 01/26/23 If Namenda is the culprit. The patient stated her tingling and numbness in hands and fingers has been the same, declined workup or neurology referral.                           Chronic numbness of R+L hands, has been using fingerless gloves which helped, declined workup, neurology referral, or medication             Senile dementia, tolerated Namenda well, helped with her mood as well. MMSE 26/30 03/23/22             Afib, on Metoprolol, Flecainide, Eliquis. Hgb 13.5 05/24/23             AS moderate             HTN off Metoprolol 05/27/23 2/2 low Bp, on Furosemide,  f/u Cardiology.              Pacemaker, f/u Cardiology             CHF, edema BLE, on Furosemide. EF 70-75% 09/19/21, 03/27/22 Venous US, negative DVT. 03/30/22 arterial US mild to moderate obstructive disease BLE             CKD Bun/creat 19/0.87 05/24/23             Dysphagia, nectar liquid.              Hypothyroidism, taking Levothyroxine, TSH 4.38 01/25/23             OA, takes Tylenol,  ambulates with walker.              CT head 02/12/22 Mild age-related atrophy and chronic microvascular ischemic changes.             Hypokalemia, K 4.2 05/24/23    Past Medical History:  Diagnosis Date   Aortic stenosis 03/08/2016   Brady-tachy syndrome (HCC)    a. Biotronik dual chamber PPM implanted 2009 b. gen change to STJ dual chamber PPM 2017   Ejection fraction    EF 70%, echo, October, 2009  //   EF 55-60%, septal dyssynergy consistent with a paced rhythm, mild to moderate mitral regurgitation, echo, November, 2015    GERD (gastroesophageal reflux disease) 04/28/2014   Episodes November, 2015 with fluid refluxing from her esophagus.    Hair loss    Patient questioned Coumadin, changed toPradaxa   Hypertension    Hypothyroidism    Lower extremity edema 01/22/2018   Mitral regurgitation 05/03/2014   Mild-to-moderate mitral regurgitation, echo, November, 2015    Osteoarthritis of left knee 10/25/2017   Paroxysmal atrial  fibrillation (HCC)    Episodes rapid atrial fib noted by pacemaker interrogation, August, 2011, diltiazem added, patient improved. Patient continues on Rythmol  //   Changed to flecainide 2013  //   flecainide level checked in 2013, good level    Persistent atrial fibrillation (HCC)    Past Surgical History:  Procedure Laterality Date   ABDOMINAL HYSTERECTOMY     EP IMPLANTABLE DEVICE N/A 08/08/2015   Generator change with [employer] DR PPM by Dr Johney Frame   JOINT REPLACEMENT     PACEMAKER PLACEMENT  2009             Review of Systems  Constitutional:  Positive for appetite change and fatigue. Negative for fever.  HENT:  Positive for hearing loss and trouble swallowing. Negative for congestion, rhinorrhea, sinus pressure and sore throat.   Eyes:  Negative for visual disturbance.  Respiratory:  Negative for cough, chest tightness, shortness of breath and wheezing.   Cardiovascular:  Positive for leg swelling. Negative for chest pain and palpitations.  Gastrointestinal:  Positive for diarrhea, nausea and vomiting. Negative for abdominal pain, anal bleeding and blood in stool.  Genitourinary:  Negative for dysuria and frequency.  Musculoskeletal:  Positive for arthralgias and gait problem.       Knees, L>R, intermittent in nature.   Skin:  Negative for color change.       Chronic venous insufficiency skin changes: mild erythema in mid of R+L   Neurological:  Positive for numbness. Negative for tremors, speech difficulty, weakness and headaches.       Memory lapses. chronic numbness in fingers.   Psychiatric/Behavioral:  Negative for confusion and sleep disturbance. The patient is not nervous/anxious.     Immunization History  Administered Date(s) Administered   Fluad Quad(high Dose 65+) 04/02/2022   Influenza Split 03/11/2009, 03/11/2010, 02/25/2011, 02/24/2019, 03/08/2020, 03/25/2021   Influenza, High Dose Seasonal PF 03/08/2015, 04/03/2017, 03/17/2018, 03/08/2020, 04/15/2023    Influenza,inj,Quad PF,6+ Mos 03/08/2016   Influenza,inj,quad, With Preservative 03/11/2017   Moderna Covid-19 Vaccine Bivalent Booster 45yrs & up 10/27/2021   Moderna SARS-COV2 Booster Vaccination 11/08/2020   Moderna Sars-Covid-2 Vaccination 06/15/2019, 07/13/2019, 04/19/2020, 02/27/2022   Pfizer Covid-19 Vaccine Bivalent Booster 81yrs & up 02/28/2021, 04/12/2022   Pneumococcal Conjugate-13 12/06/2014   Pneumococcal Polysaccharide-23 05/13/2001, 03/11/2009   Polio, Unspecified 02/02/1958   Tdap 11/21/2011   Zoster Recombinant(Shingrix) 07/18/2022, 10/19/2022   Pertinent  Health Maintenance  Due  Topic Date Due   INFLUENZA VACCINE  Completed   DEXA SCAN  Completed      02/16/2022    9:45 AM 02/27/2022   11:41 AM 07/06/2022   10:04 AM 07/13/2022    3:55 PM 03/25/2023    4:51 PM  Fall Risk  Falls in the past year?   0 0 1  Was there an injury with Fall?   0 0 0  Fall Risk Category Calculator   0 0 1  (RETIRED) Patient Fall Risk Level High fall risk High fall risk     Patient at Risk for Falls Due to  History of fall(s) No Fall Risks No Fall Risks History of fall(s);Impaired balance/gait;Impaired mobility  Fall risk Follow up  Falls evaluation completed  Falls evaluation completed Falls evaluation completed;Falls prevention discussed;Education provided   Functional Status Survey:    Vitals:   05/27/23 1109  BP: (!) 120/50  Pulse: 68  Resp: 18  Temp: (!) 97.5 F (36.4 C)  SpO2: 94%  Weight: 206 lb (93.4 kg)   Body mass index is 36.49 kg/m. Physical Exam Vitals and nursing note reviewed.  Constitutional:      Comments: fatigue  HENT:     Head: Normocephalic and atraumatic.     Nose: Nose normal.     Mouth/Throat:     Mouth: Mucous membranes are moist.  Eyes:     Extraocular Movements: Extraocular movements intact.     Conjunctiva/sclera: Conjunctivae normal.     Pupils: Pupils are equal, round, and reactive to light.  Cardiovascular:     Rate and Rhythm: Normal rate.  Rhythm irregular.     Heart sounds: Murmur heard.     Comments: No dorsalis pedis pulses felt from previous examination.  Pulmonary:     Effort: Pulmonary effort is normal.     Breath sounds: No rales.  Abdominal:     General: Bowel sounds are normal.     Palpations: Abdomen is soft.     Tenderness: There is no abdominal tenderness. There is no right CVA tenderness, left CVA tenderness, guarding or rebound.  Genitourinary:    Rectum: Guaiac result positive.     Comments: External hemorrhoids.  Musculoskeletal:        General: Normal range of motion.     Cervical back: Normal range of motion and neck supple.     Right lower leg: Edema present.     Left lower leg: Edema present.     Comments: Trace -1+ edema LLE>RLE  Skin:    General: Skin is warm and dry.     Findings: Erythema present.     Comments: Mild dark erythema mid of R+L lower legs, chronic.     Neurological:     General: No focal deficit present.     Mental Status: She is alert and oriented to person, place, and time. Mental status is at baseline.     Gait: Gait abnormal.     Comments: 5/5 muscle strength in fingers.   Psychiatric:        Mood and Affect: Mood normal.        Behavior: Behavior normal.     Labs reviewed: Recent Labs    01/18/23 0000 01/26/23 0000 03/25/23 0000  NA 140 139 137  K 3.7 4.3 4.4  CL 104 103 101  CO2 28* 24* 28*  BUN 16 13 16   CREATININE 0.8 0.9 0.9  CALCIUM 9.1 9.6 9.2   Recent Labs  07/05/22 0000 01/18/23 0000 01/26/23 0000  AST 23 23 24   ALT 18 16 17   ALKPHOS 44 37 40  ALBUMIN 3.8 3.7 3.7   Recent Labs    01/18/23 0000 01/26/23 0000 03/25/23 0000  WBC 5.2 4.6 10.8  NEUTROABS 2,782.00 1,697.00 7,700.00  HGB 13.4 13.6 14.0  HCT 41 41 43  PLT 113* 121* 162   Lab Results  Component Value Date   TSH 4.38 01/26/2023   No results found for: "HGBA1C" Lab Results  Component Value Date   CHOL 193 05/13/2017   HDL 41 05/13/2017   LDLCALC 122 (H) 05/13/2017    TRIG 152 (H) 05/13/2017   CHOLHDL 4.7 (H) 05/13/2017    Significant Diagnostic Results in last 30 days:  CUP PACEART INCLINIC DEVICE CHECK Result Date: 05/15/2023 Pacemaker check in clinic. Normal device function. Thresholds, sensing, impedance's consistent with previous measurements. Device programmed to maximize longevity. 2 brief PATs, no high ventricular rates noted. Device programmed at appropriate safety margins. Histogram distribution appropriate for patient activity level. Device programmed to optimize intrinsic conduction. Estimated longevity _3.2 years_. Patient enrolled in remote follow-up. Patient education completed.   Assessment/Plan: Acute gastritis nausea, vomiting, diarrhea x 1 days, noted generalized weakness and poor appetite,   denied abd pain, she is afebrile.  COVID test 05/27/23 may obtain lipase, C-diff Toxin A/B x3 if GI symptoms persist, Zofran 4mg  q6hr prn x 24 hours available to her, observe.     BRBPR (bright red blood per rectum) BRBPR 05/23/23, FOBT positive 05/24/23, noted hemorrhoids, placed PPI, wbc 7.6, Hgb 13.5, plt 136, neutrophils 57.3 05/24/23  Numbness of fingers of both hands 01/11/23 family expressed concerning of hand and fingers tingling and numbness, desires checking TSH 4.38 01/26/23 If Namenda is the culprit. The patient stated her tingling and numbness in hands and fingers has been the same, declined workup or neurology referral.                           Chronic numbness of R+L hands, has been using fingerless gloves which helped, declined workup, neurology referral, or medication  Senile dementia (HCC)  tolerated Namenda well, helped with her mood as well. MMSE 26/30 03/23/22  Persistent atrial fibrillation (HCC) Heart rate is controlled, off Metoprolol 2/2 low bp, continue Flecainide, Eliquis. Hgb 13.5 05/24/23  Essential hypertension, benign off Metoprolol 05/27/23 2/2 low Bp, hold Furosemide/Kclx72hr in setting of low Bp and poor oral  intake, f/u Cardiology.  Bp/P daily   Congestive heart failure (HCC)  edema BLE, on Furosemide. EF 70-75% 09/19/21, 03/27/22 Venous US, negative DVT. 03/30/22 arterial US mild to moderate obstructive disease BLE  CKD (chronic kidney disease) stage 3, GFR 30-59 ml/min (HCC) Bun/creat 19/0.87 05/24/23  Hypothyroidism  taking Levothyroxine, TSH 4.38 01/25/23    Family/ staff Communication: plan of care reviewed with the patient and charge nurse.   Labs/tests ordered:  none  Time spend 40 minutes.

## 2023-05-27 NOTE — Assessment & Plan Note (Signed)
Bun/creat 19/0.87 05/24/23

## 2023-05-27 NOTE — Assessment & Plan Note (Addendum)
off Metoprolol 05/27/23 2/2 low Bp, hold Furosemide/Kclx72hr in setting of low Bp and poor oral intake, f/u Cardiology.  Bp/P daily

## 2023-05-27 NOTE — Assessment & Plan Note (Signed)
01/11/23 family expressed concerning of hand and fingers tingling and numbness, desires checking TSH 4.38 01/26/23 If Namenda is the culprit. The patient stated her tingling and numbness in hands and fingers has been the same, declined workup or neurology referral.                           Chronic numbness of R+L hands, has been using fingerless gloves which helped, declined workup, neurology referral, or medication

## 2023-05-27 NOTE — Assessment & Plan Note (Signed)
Heart rate is controlled, off Metoprolol 2/2 low bp, continue Flecainide, Eliquis. Hgb 13.5 05/24/23

## 2023-06-13 ENCOUNTER — Encounter: Payer: Self-pay | Admitting: Nurse Practitioner

## 2023-06-13 ENCOUNTER — Non-Acute Institutional Stay: Payer: Medicare Other | Admitting: Nurse Practitioner

## 2023-06-13 DIAGNOSIS — Z66 Do not resuscitate: Secondary | ICD-10-CM | POA: Diagnosis not present

## 2023-06-13 DIAGNOSIS — M150 Primary generalized (osteo)arthritis: Secondary | ICD-10-CM | POA: Diagnosis not present

## 2023-06-13 DIAGNOSIS — R200 Anesthesia of skin: Secondary | ICD-10-CM | POA: Diagnosis not present

## 2023-06-13 DIAGNOSIS — I509 Heart failure, unspecified: Secondary | ICD-10-CM | POA: Diagnosis not present

## 2023-06-13 DIAGNOSIS — I10 Essential (primary) hypertension: Secondary | ICD-10-CM | POA: Diagnosis not present

## 2023-06-13 DIAGNOSIS — K625 Hemorrhage of anus and rectum: Secondary | ICD-10-CM

## 2023-06-13 DIAGNOSIS — F0390 Unspecified dementia without behavioral disturbance: Secondary | ICD-10-CM | POA: Diagnosis not present

## 2023-06-13 DIAGNOSIS — E039 Hypothyroidism, unspecified: Secondary | ICD-10-CM | POA: Diagnosis not present

## 2023-06-13 DIAGNOSIS — I4819 Other persistent atrial fibrillation: Secondary | ICD-10-CM

## 2023-06-13 DIAGNOSIS — Z950 Presence of cardiac pacemaker: Secondary | ICD-10-CM | POA: Diagnosis not present

## 2023-06-13 DIAGNOSIS — N1831 Chronic kidney disease, stage 3a: Secondary | ICD-10-CM | POA: Diagnosis not present

## 2023-06-13 DIAGNOSIS — R1319 Other dysphagia: Secondary | ICD-10-CM

## 2023-06-13 NOTE — Assessment & Plan Note (Signed)
 Hemorrhoidal etiology, no further occurrence.

## 2023-06-13 NOTE — Assessment & Plan Note (Signed)
 Heart rate is in control, on Metoprolol, Flecainide, Eliquis. Hgb 13.5 05/24/23

## 2023-06-13 NOTE — Assessment & Plan Note (Signed)
edema BLE, on Furosemide. EF 70-75% 09/19/21, 03/27/22 Venous US, negative DVT. 03/30/22 arterial US mild to moderate obstructive disease BLE 

## 2023-06-13 NOTE — Assessment & Plan Note (Signed)
 tolerated Namenda well, helped with her mood as well. MMSE 26/30 03/23/22, CT head 02/12/22 Mild age-related atrophy and chronic microvascular ischemic changes.

## 2023-06-13 NOTE — Progress Notes (Addendum)
 Location:  [facility] Room Number: 824-A Place of Service:  ALF [phone]) Provider:  Kama Cammarano OLEGARIO CARROLYN Sherlynn Jackalyn, MD  Patient Care Team: Sherlynn Jackalyn, MD as PCP - General (Internal Medicine) Kelsie Agent, MD (Inactive) as PCP - Electrophysiology (Cardiology) Hobart Powell BRAVO, MD (Inactive) as PCP - Cardiology (Cardiology) [name], FNP (Hospice and Palliative Medicine)  Extended Emergency Contact Information Primary Emergency Contact: [facility] Phone: [phone] Mobile Phone: [phone] Relation: Daughter Interpreter needed? No Secondary Emergency Contact: Pearman,Karen Address: [address]          [place], [place] [address] [place] Home Phone: [phone] Mobile Phone: [phone] Relation: Daughter  Code Status:  DNR Goals of care: Advanced Directive information    06/13/2023    3:30 PM  Advanced Directives  Does Patient Have a Medical Advance Directive? Yes  Type of Estate Agent of [place];Out of facility DNR (pink MOST or yellow form)  Does patient want to make changes to medical advance directive? No - Patient declined  Copy of Healthcare Power of Attorney in Chart? Yes - validated most recent copy scanned in chart (See row information)  Pre-existing out of facility DNR order (yellow form or pink MOST form) Yellow form placed in chart (order not valid for inpatient use)     Chief Complaint  Patient presents with  . Medical Management of Chronic Issues    Routine visit and discuss tdap and covid vaccines.    HPI:  Pt is a 89 y.o. female seen today for medical management of chronic diseases.    Acute gastritis, better appetite, oral intake, no further nausea, abd pain/discomfort, vomiting, less loose stools now  BRBPR 05/23/23, FOBT positive 05/24/23, noted hemorrhoids, placed PPI, wbc 7.6, Hgb 13.5, plt 136, neutrophils 57.3 05/24/23              01/11/23 family  expressed concerning of hand and fingers tingling and numbness, desires checking TSH 4.38 01/26/23 If Namenda  is the culprit. The patient stated her tingling and numbness in hands and fingers has been the same, declined workup or neurology referral.                           Chronic numbness of R+L hands, has been using fingerless gloves which helped, declined workup, neurology referral, or medication             Senile dementia, tolerated Namenda  well, helped with her mood as well. MMSE 26/30 03/23/22, CT head 02/12/22 Mild age-related atrophy and chronic microvascular ischemic changes.             Afib, on Metoprolol , Flecainide , Eliquis . Hgb 13.5 05/24/23             AS moderate             HTN off Metoprolol  05/27/23 2/2 low Bp, on Furosemide ,  f/u Cardiology.              Pacemaker, f/u Cardiology             CHF, edema BLE, on Furosemide . EF 70-75% 09/19/21, 03/27/22 Venous US , negative DVT. 03/30/22 arterial US  mild to moderate obstructive disease BLE             CKD Bun/creat 19/0.87 05/24/23             Dysphagia, nectar liquid.              Hypothyroidism, taking  Levothyroxine , TSH 4.38 01/25/23             OA, takes Tylenol , ambulates with walker.              Hypokalemia, K 4.2 05/24/23  Past Medical History:  Diagnosis Date  . Aortic stenosis 03/08/2016  . Brady-tachy syndrome (HCC)    a. Biotronik dual chamber PPM implanted 2009 b. gen change to STJ dual chamber PPM 2017  . Ejection fraction    EF 70%, echo, October, 2009  //   EF 55-60%, septal dyssynergy consistent with a paced rhythm, mild to moderate mitral regurgitation, echo, November, 2015   . GERD (gastroesophageal reflux disease) 04/28/2014   Episodes November, 2015 with fluid refluxing from her esophagus.   . Hair loss    Patient questioned Coumadin, changed toPradaxa  . Hypertension   . Hypothyroidism   . Lower extremity edema 01/22/2018  . Mitral regurgitation 05/03/2014   Mild-to-moderate mitral regurgitation, echo,  November, 2015   . Osteoarthritis of left knee 10/25/2017  . Paroxysmal atrial fibrillation (HCC)    Episodes rapid atrial fib noted by pacemaker interrogation, August, 2011, diltiazem  added, patient improved. Patient continues on Rythmol   //   Changed to flecainide  2013  //   flecainide  level checked in 2013, good level   . Persistent atrial fibrillation [facility])    Past Surgical History:  Procedure Laterality Date  . ABDOMINAL HYSTERECTOMY    . EP IMPLANTABLE DEVICE N/A 08/08/2015   Generator change with S[employer] DR PPM by Dr Kelsie  . JOINT REPLACEMENT    . PACEMAKER PLACEMENT  2009         Outpatient Encounter Medications as of 06/13/2023  Medication Sig  . acetaminophen  (TYLENOL ) 325 MG tablet Take 650 mg by mouth every 6 (six) hours as needed.  . acetaminophen  (TYLENOL ) 500 MG tablet Take 1,000 mg by mouth in the morning and at bedtime.  SABRA Apoaequorin (PREVAGEN) 10 MG CAPS Take 10 mg by mouth daily.  . betamethasone , augmented, (DIPROLENE ) 0.05 % gel Apply topically daily.  Apply to scalp topically every 12 hours as needed for psoriasis related to PSORIASIS, UNSPECIFIED (L40.9) apply to affected area of scalp  . Calcium  Carbonate-Vit D-Min (CALCIUM  600+D PLUS MINERALS) 600-400 MG-UNIT TABS Take 1 tablet by mouth daily.  . Carboxymethylcell-Glycerin  PF (REFRESH RELIEVA PF) 0.5-1 % SOLN Apply 1 drop to eye in the morning and at bedtime. for relieve discomfort  . diclofenac  Sodium (VOLTAREN ) 1 % GEL Apply 1 g topically 2 (two) times daily as needed.  . ELIQUIS  5 MG TABS tablet TAKE ONE TABLET BY MOUTH TWICE A DAY  . flecainide  (TAMBOCOR ) 50 MG tablet Take 1 tablet (50 mg total) by mouth 2 (two) times daily.  . furosemide  (LASIX ) 20 MG tablet TAKE ONE TABLET BY MOUTH EVERY DAY  . hydrocortisone 2.5 % ointment as needed (rash). Apply to affected areas topically as needed for Rashy/itchy areas two times daily-MAY SELF ADMINISTER-per pt request  . Ipratropium-Albuterol  (COMBIVENT) 20-100  MCG/ACT AERS respimat Inhale 1 puff into the lungs every 6 (six) hours.  . levothyroxine  (SYNTHROID ) 100 MCG tablet Take 1 tablet (100 mcg total) by mouth every morning.  . Multiple Vitamin (MULTIVITAMIN ADULT) TABS Take 1 tablet by mouth daily.  . Multiple Vitamins-Minerals (OCUVITE PO) Take 1 capsule by mouth daily.  . NAMENDA  10 MG tablet TAKE ONE TABLET BY MOUTH TWICE A DAY FOR COGNITIVE DECLINE  . potassium chloride  (KLOR-CON  M) 10 MEQ tablet Take 1 tablet (  10 mEq total) by mouth daily.  . sodium chloride  (MURO 128) 5 % ophthalmic ointment Place 1 Application into both eyes at bedtime.  . sodium chloride  (MURO 128) 5 % ophthalmic solution Place 1 drop into both eyes in the morning and at bedtime. for decrease swelling Wait 1 hour between these and Refresh  . metoprolol  succinate (TOPROL -XL) 25 MG 24 hr tablet Take 3 tablets (75 mg total) by mouth daily. (Patient not taking: Reported on 06/13/2023)   No facility-administered encounter medications on file as of 06/13/2023.    Review of Systems  Constitutional:  Negative for appetite change, fatigue and fever.  HENT:  Positive for hearing loss and trouble swallowing. Negative for congestion, rhinorrhea, sinus pressure and sore throat.   Eyes:  Negative for visual disturbance.  Respiratory:  Negative for cough, chest tightness, shortness of breath and wheezing.   Cardiovascular:  Positive for leg swelling. Negative for chest pain and palpitations.  Gastrointestinal:  Positive for diarrhea. Negative for abdominal pain, anal bleeding, blood in stool, nausea and vomiting.       Improved, 2-3x today.   Genitourinary:  Negative for dysuria and frequency.  Musculoskeletal:  Positive for arthralgias and gait problem.       Knees, L>R, intermittent in nature.   Skin:  Negative for color change.       Chronic venous insufficiency skin changes: mild erythema in mid of R+L   Neurological:  Positive for numbness. Negative for tremors, speech difficulty,  weakness and headaches.       Memory lapses. chronic numbness in fingers.   Psychiatric/Behavioral:  Negative for confusion and sleep disturbance. The patient is not nervous/anxious.     Immunization History  Administered Date(s) Administered  . Fluad Quad(high Dose 65+) 04/02/2022  . Influenza Split 03/11/2009, 03/11/2010, 02/25/2011, 02/24/2019, 03/08/2020, 03/25/2021  . Influenza, High Dose Seasonal PF 03/08/2015, 04/03/2017, 03/17/2018, 03/08/2020, 04/15/2023  . Influenza,inj,Quad PF,6+ Mos 03/08/2016  . Influenza,inj,quad, With Preservative 03/11/2017  . Moderna Covid-19 Vaccine  Bivalent Booster 30yrs & up 10/27/2021  . Moderna SARS-COV2 Booster Vaccination 11/08/2020  . Moderna Sars-Covid-2 Vaccination 06/15/2019, 07/13/2019, 04/19/2020, 02/27/2022  . Pfizer Covid-19 Vaccine Bivalent Booster 48yrs & up 02/28/2021, 04/12/2022  . Pneumococcal Conjugate-13 12/06/2014  . Pneumococcal Polysaccharide-23 05/13/2001, 03/11/2009  . Polio, Unspecified 02/02/1958  . Tdap 11/21/2011  . Zoster Recombinant(Shingrix) 07/18/2022, 10/19/2022   Pertinent  Health Maintenance Due  Topic Date Due  . INFLUENZA VACCINE  Completed  . DEXA SCAN  Completed      02/16/2022    9:45 AM 02/27/2022   11:41 AM 07/06/2022   10:04 AM 07/13/2022    3:55 PM 03/25/2023    4:51 PM  Fall Risk  Falls in the past year?   0 0 1  Was there an injury with Fall?   0 0 0  Fall Risk Category Calculator   0 0 1  (RETIRED) Patient Fall Risk Level High fall risk High fall risk     Patient at Risk for Falls Due to  History of fall(s) No Fall Risks No Fall Risks History of fall(s);Impaired balance/gait;Impaired mobility  Fall risk Follow up  Falls evaluation completed  Falls evaluation completed Falls evaluation completed;Falls prevention discussed;Education provided   Functional Status Survey:    Vitals:   06/13/23 1529  BP: (!) 132/56  Pulse: 76  Resp: 20  Temp: 98 F (36.7 C)  SpO2: 97%  Weight: 200 lb 9.6 oz  (91 kg)  Height: 5' 3 (1.6 m)  Body mass index is 35.53 kg/m. Physical Exam Vitals and nursing note reviewed.  Constitutional:      Appearance: Normal appearance.  HENT:     Head: Normocephalic and atraumatic.     Nose: Nose normal.     Mouth/Throat:     Mouth: Mucous membranes are moist.  Eyes:     Extraocular Movements: Extraocular movements intact.     Conjunctiva/sclera: Conjunctivae normal.     Pupils: Pupils are equal, round, and reactive to light.  Cardiovascular:     Rate and Rhythm: Normal rate. Rhythm irregular.     Heart sounds: Murmur heard.     Comments: No dorsalis pedis pulses felt from previous examination.  Pulmonary:     Effort: Pulmonary effort is normal.     Breath sounds: No rales.  Abdominal:     General: Bowel sounds are normal.     Palpations: Abdomen is soft.     Tenderness: There is no abdominal tenderness. There is no right CVA tenderness, left CVA tenderness, guarding or rebound.  Genitourinary:    Comments: External hemorrhoids from previous examination Musculoskeletal:        General: Normal range of motion.     Cervical back: Normal range of motion and neck supple.     Right lower leg: Edema present.     Left lower leg: Edema present.     Comments: Trace 1+ edema LLE>RLE  Skin:    General: Skin is warm and dry.     Findings: Erythema present.     Comments: Mild dark erythema mid of R+L lower legs, chronic.     Neurological:     General: No focal deficit present.     Mental Status: She is alert and oriented to person, place, and time. Mental status is at baseline.     Gait: Gait abnormal.     Comments: 5/5 muscle strength in fingers.   Psychiatric:        Mood and Affect: Mood normal.        Behavior: Behavior normal.    Labs reviewed: Recent Labs    01/18/23 0000 01/26/23 0000 03/25/23 0000  NA 140 139 137  K 3.7 4.3 4.4  CL 104 103 101  CO2 28* 24* 28*  BUN 16 13 16   CREATININE 0.8 0.9 0.9  CALCIUM  9.1 9.6 9.2    Recent Labs    07/05/22 0000 01/18/23 0000 01/26/23 0000  AST 23 23 24   ALT 18 16 17   ALKPHOS 44 37 40  ALBUMIN 3.8 3.7 3.7   Recent Labs    01/26/23 0000 03/25/23 0000 05/24/23 0000  WBC 4.6 10.8 7.6  NEUTROABS 1,697.00 7,700.00 4,355.00  HGB 13.6 14.0 13.5  HCT 41 43 41  PLT 121* 162 136*   Lab Results  Component Value Date   TSH 4.38 01/26/2023   No results found for: HGBA1C Lab Results  Component Value Date   CHOL 193 05/13/2017   HDL 41 05/13/2017   LDLCALC 122 (H) 05/13/2017   TRIG 152 (H) 05/13/2017   CHOLHDL 4.7 (H) 05/13/2017    Significant Diagnostic Results in last 30 days:  CUP PACEART INCLINIC DEVICE CHECK Result Date: 05/15/2023 Pacemaker check in clinic. Normal device function. Thresholds, sensing, impedance's consistent with previous measurements. Device programmed to maximize longevity. 2 brief PATs, no high ventricular rates noted. Device programmed at appropriate safety margins. Histogram distribution appropriate for patient activity level. Device programmed to optimize intrinsic conduction. Estimated longevity _3.2 years_. Patient enrolled  in remote follow-up. Patient education completed. A+P Hypertension Runs low, off Metoprolol  05/27/23 2/2 low Bp, on Furosemide ,  f/u Cardiology.   Pacemaker  f/u Cardiology  Congestive heart failure (HCC)  edema BLE, on Furosemide . EF 70-75% 09/19/21, 03/27/22 Venous US , negative DVT. 03/30/22 arterial US  mild to moderate obstructive disease BLE  CKD (chronic kidney disease) stage 3, GFR 30-59 ml/min (HCC) Bun/creat 19/0.87 05/24/23  Esophageal dysphagia  nectar liquid.   Hypothyroidism  taking Levothyroxine , TSH 4.38 01/25/23  Osteoarthritis, multiple sites takes Tylenol , ambulates with walker.   Senile dementia (HCC) tolerated Namenda  well, helped with her mood as well. MMSE 26/30 03/23/22, CT head 02/12/22 Mild age-related atrophy and chronic microvascular ischemic changes.  Persistent atrial  fibrillation (HCC) Heart rate is in control, on Metoprolol , Flecainide , Eliquis . Hgb 13.5 05/24/23  BRBPR (bright red blood per rectum) Hemorrhoidal etiology, no further occurrence.   Numbness of fingers of both hands 01/11/23 family expressed concerning of hand and fingers tingling and numbness, desires checking TSH 4.38 01/26/23 If Namenda  is the culprit. The patient stated her tingling and numbness in hands and fingers has been the same, declined workup or neurology referral.                           Chronic numbness of R+L hands, has been using fingerless gloves which helped, declined workup, neurology referral, or medication     Family/ staff Communication: plan of care reviewed with the patient and charge nurse.   Labs/tests ordered:  none  Time spend 40 minutes.

## 2023-06-13 NOTE — Assessment & Plan Note (Signed)
takes Tylenol, ambulates with walker.

## 2023-06-13 NOTE — Assessment & Plan Note (Signed)
 Runs low, off Metoprolol 05/27/23 2/2 low Bp, on Furosemide,  f/u Cardiology.

## 2023-06-13 NOTE — Assessment & Plan Note (Signed)
nectar liquid 

## 2023-06-13 NOTE — Assessment & Plan Note (Signed)
 Bun/creat 19/0.87 05/24/23

## 2023-06-13 NOTE — Assessment & Plan Note (Signed)
f/u Cardiology 

## 2023-06-13 NOTE — Assessment & Plan Note (Signed)
 01/11/23 family expressed concerning of hand and fingers tingling and numbness, desires checking TSH 4.38 01/26/23 If Namenda is the culprit. The patient stated her tingling and numbness in hands and fingers has been the same, declined workup or neurology referral.                           Chronic numbness of R+L hands, has been using fingerless gloves which helped, declined workup, neurology referral, or medication

## 2023-06-13 NOTE — Assessment & Plan Note (Signed)
taking Levothyroxine, TSH 4.38 01/25/23

## 2023-06-18 ENCOUNTER — Encounter: Payer: Self-pay | Admitting: Nurse Practitioner

## 2023-06-18 ENCOUNTER — Non-Acute Institutional Stay: Payer: Self-pay | Admitting: Nurse Practitioner

## 2023-06-18 DIAGNOSIS — M25512 Pain in left shoulder: Secondary | ICD-10-CM | POA: Diagnosis not present

## 2023-06-18 DIAGNOSIS — G3184 Mild cognitive impairment, so stated: Secondary | ICD-10-CM

## 2023-06-18 DIAGNOSIS — I4819 Other persistent atrial fibrillation: Secondary | ICD-10-CM | POA: Diagnosis not present

## 2023-06-18 DIAGNOSIS — I509 Heart failure, unspecified: Secondary | ICD-10-CM

## 2023-06-18 DIAGNOSIS — M150 Primary generalized (osteo)arthritis: Secondary | ICD-10-CM | POA: Diagnosis not present

## 2023-06-18 DIAGNOSIS — I10 Essential (primary) hypertension: Secondary | ICD-10-CM

## 2023-06-18 DIAGNOSIS — R200 Anesthesia of skin: Secondary | ICD-10-CM

## 2023-06-18 DIAGNOSIS — M8589 Other specified disorders of bone density and structure, multiple sites: Secondary | ICD-10-CM | POA: Diagnosis not present

## 2023-06-18 DIAGNOSIS — M19012 Primary osteoarthritis, left shoulder: Secondary | ICD-10-CM | POA: Diagnosis not present

## 2023-06-18 NOTE — Assessment & Plan Note (Signed)
Chronic numbness of R+L hands, has been using fingerless gloves which helped, declined workup, neurology referral, or medication

## 2023-06-18 NOTE — Assessment & Plan Note (Signed)
 tolerated Namenda well, helped with her mood as well. MMSE 27/30 04/09/23, CT head 02/12/22 Mild age-related atrophy and chronic microvascular ischemic changes.

## 2023-06-18 NOTE — Assessment & Plan Note (Signed)
 left shoulder pain with ROM resulted from a mechanical fall, no apparent deformity or bruise/contusion. The patient denied headache, dizziness, SOB, palpitation, or focal weakness associated with the fall.  HPOA daughter declined ED eval presently Will obtain X-Dye left shoulder/humerus 3 view Tylenol  650mg  tid po with food Immobilizes left shoulder/humerus as possible.

## 2023-06-18 NOTE — Assessment & Plan Note (Signed)
 edema BLE, on Furosemide. EF 70-75% 09/19/21, 03/27/22 Venous US, negative DVT. 03/30/22 arterial US mild to moderate obstructive disease BLE Bun/creat 19/0.87 05/24/23

## 2023-06-18 NOTE — Assessment & Plan Note (Signed)
 Blood pressure is controlled,  off Metoprolol 05/27/23 2/2 low Bp, on Furosemide,  f/u Cardiology.

## 2023-06-18 NOTE — Progress Notes (Signed)
 Location:   [facility] Room Number: 825 Place of Service:  ALF (13) Provider: Larwance Dalyn Becker NP  [name], MD  Patient Care Team: [name], MD as PCP - General (Internal Medicine) Kelsie Agent, MD (Inactive) as PCP - Electrophysiology (Cardiology) Hobart Powell BRAVO, MD (Inactive) as PCP - Cardiology (Cardiology) [name], FNP (Hospice and Palliative Medicine)  Extended Emergency Contact Information Primary Emergency Contact: [facility] Phone: [phone] Mobile Phone: [phone] Relation: Daughter Interpreter needed? No Secondary Emergency Contact: Pearman,Karen Address: [address]          [place], [place] [address] [place] Home Phone: [phone] Mobile Phone: [phone] Relation: Daughter  Code Status: DNR Goals of care: Advanced Directive information    06/13/2023    3:30 PM  Advanced Directives  Does Patient Have a Medical Advance Directive? Yes  Type of Estate Agent of [place];Out of facility DNR (pink MOST or yellow form)  Does patient want to make changes to medical advance directive? No - Patient declined  Copy of Healthcare Power of Attorney in Chart? Yes - validated most recent copy scanned in chart (See row information)  Pre-existing out of facility DNR order (yellow form or pink MOST form) Yellow form placed in chart (order not valid for inpatient use)     Chief Complaint  Patient presents with   Acute Visit    Left shoulder pain sustained from falling    HPI:  Pt is a 89 y.o. female seen today for an acute visit for left shoulder pain with ROM resulted from a mechanical fall, no apparent deformity or bruise/contusion. The patient denied headache, dizziness, SOB, palpitation, or focal weakness associated with the fall.   BRBPR 05/23/23, FOBT positive 05/24/23, noted hemorrhoids, placed PPI, wbc 7.6, Hgb 13.5, plt 136, neutrophils 57.3 05/24/23               01/11/23 family expressed concerning of hand and fingers tingling and numbness, desires checking TSH 4.38 01/26/23 If Namenda  is the culprit. The patient stated her tingling and numbness in hands and fingers has been the same, declined workup or neurology referral.                           Chronic numbness of R+L hands, has been using fingerless gloves which helped, declined workup, neurology referral, or medication             Senile dementia, tolerated Namenda  well, helped with her mood as well. MMSE 27/30 04/09/23, CT head 02/12/22 Mild age-related atrophy and chronic microvascular ischemic changes.             Afib, off Metoprolol , on Flecainide , Eliquis . Hgb 13.5 05/24/23             AS moderate             HTN off Metoprolol  05/27/23 2/2 low Bp, on Furosemide ,  f/u Cardiology.              Pacemaker, f/u Cardiology             CHF, edema BLE, on Furosemide . EF 70-75% 09/19/21, 03/27/22 Venous US , negative DVT. 03/30/22 arterial US  mild to moderate obstructive disease BLE             CKD Bun/creat 19/0.87 05/24/23             Dysphagia, nectar liquid.  Hypothyroidism, taking Levothyroxine , TSH 4.38 01/25/23             OA, takes Tylenol , ambulates with walker.              Hypokalemia, K 4.2 05/24/23  Past Medical History:  Diagnosis Date   Aortic stenosis 03/08/2016   Brady-tachy syndrome (HCC)    a. Biotronik dual chamber PPM implanted 2009 b. gen change to STJ dual chamber PPM 2017   Ejection fraction    EF 70%, echo, October, 2009  //   EF 55-60%, septal dyssynergy consistent with a paced rhythm, mild to moderate mitral regurgitation, echo, November, 2015    GERD (gastroesophageal reflux disease) 04/28/2014   Episodes November, 2015 with fluid refluxing from her esophagus.    Hair loss    Patient questioned Coumadin, changed toPradaxa   Hypertension    Hypothyroidism    Lower extremity edema 01/22/2018   Mitral regurgitation 05/03/2014   Mild-to-moderate mitral  regurgitation, echo, November, 2015    Osteoarthritis of left knee 10/25/2017   Paroxysmal atrial fibrillation (HCC)    Episodes rapid atrial fib noted by pacemaker interrogation, August, 2011, diltiazem  added, patient improved. Patient continues on Rythmol   //   Changed to flecainide  2013  //   flecainide  level checked in 2013, good level    Persistent atrial fibrillation (HCC)    Past Surgical History:  Procedure Laterality Date   ABDOMINAL HYSTERECTOMY     EP IMPLANTABLE DEVICE N/A 08/08/2015   Generator change with SJM Assurity DR PPM by Dr Kelsie   JOINT REPLACEMENT     PACEMAKER PLACEMENT  2009             Review of Systems  Constitutional:  Negative for appetite change, fatigue and fever.  HENT:  Positive for hearing loss and trouble swallowing. Negative for congestion.   Eyes:  Negative for visual disturbance.  Respiratory:  Negative for cough, chest tightness and shortness of breath.   Cardiovascular:  Positive for leg swelling. Negative for chest pain and palpitations.  Gastrointestinal:  Negative for abdominal pain and constipation.  Genitourinary:  Negative for dysuria and frequency.  Musculoskeletal:  Positive for arthralgias and gait problem.       Knees, L>R, intermittent in nature.  Left shoulder pain with ROM  Skin:  Negative for color change.       Chronic venous insufficiency skin changes: mild erythema in mid of R+L   Neurological:  Positive for numbness. Negative for tremors, speech difficulty, weakness and headaches.       Memory lapses. chronic numbness in fingers.   Psychiatric/Behavioral:  Negative for confusion and sleep disturbance. The patient is not nervous/anxious.     Immunization History  Administered Date(s) Administered   Fluad Quad(high Dose 65+) 04/02/2022   Influenza Split 03/11/2009, 03/11/2010, 02/25/2011, 02/24/2019, 03/08/2020, 03/25/2021   Influenza, High Dose Seasonal PF 03/08/2015, 04/03/2017, 03/17/2018, 03/08/2020, 04/15/2023    Influenza,inj,Quad PF,6+ Mos 03/08/2016   Influenza,inj,quad, With Preservative 03/11/2017   Moderna Covid-19 Vaccine  Bivalent Booster 60yrs & up 10/27/2021   Moderna SARS-COV2 Booster Vaccination 11/08/2020   Moderna Sars-Covid-2 Vaccination 06/15/2019, 07/13/2019, 04/19/2020, 02/27/2022   Pfizer Covid-19 Vaccine Bivalent Booster 11yrs & up 02/28/2021, 04/12/2022   Pneumococcal Conjugate-13 12/06/2014   Pneumococcal Polysaccharide-23 05/13/2001, 03/11/2009   Polio, Unspecified 02/02/1958   Tdap 11/21/2011   Zoster Recombinant(Shingrix) 07/18/2022, 10/19/2022   Pertinent  Health Maintenance Due  Topic Date Due   INFLUENZA VACCINE  Completed   DEXA SCAN  Completed  02/16/2022    9:45 AM 02/27/2022   11:41 AM 07/06/2022   10:04 AM 07/13/2022    3:55 PM 03/25/2023    4:51 PM  Fall Risk  Falls in the past year?   0 0 1  Was there an injury with Fall?   0 0 0  Fall Risk Category Calculator   0 0 1  (RETIRED) Patient Fall Risk Level High fall risk High fall risk     Patient at Risk for Falls Due to  History of fall(s) No Fall Risks No Fall Risks History of fall(s);Impaired balance/gait;Impaired mobility  Fall risk Follow up  Falls evaluation completed  Falls evaluation completed Falls evaluation completed;Falls prevention discussed;Education provided   Functional Status Survey:    Vitals:   06/18/23 1223  BP: (!) 156/72  Pulse: 71  Resp: 16  Temp: 97.6 F (36.4 C)  SpO2: 94%  Weight: 200 lb 9.6 oz (91 kg)   Body mass index is 35.53 kg/m. Physical Exam Vitals and nursing note reviewed.  Constitutional:      Appearance: Normal appearance.  HENT:     Head: Normocephalic and atraumatic.     Nose: Nose normal.     Mouth/Throat:     Mouth: Mucous membranes are moist.  Eyes:     Extraocular Movements: Extraocular movements intact.     Conjunctiva/sclera: Conjunctivae normal.     Pupils: Pupils are equal, round, and reactive to light.  Cardiovascular:     Rate and Rhythm:  Normal rate. Rhythm irregular.     Heart sounds: Murmur heard.     Comments: No dorsalis pedis pulses felt from previous examination.  Pulmonary:     Effort: Pulmonary effort is normal.     Breath sounds: No rales.  Abdominal:     General: Bowel sounds are normal.     Palpations: Abdomen is soft.     Tenderness: There is no abdominal tenderness. There is no right CVA tenderness, left CVA tenderness, guarding or rebound.  Genitourinary:    Comments: External hemorrhoids from previous examination Musculoskeletal:        General: Tenderness present. Normal range of motion.     Cervical back: Normal range of motion and neck supple.     Right lower leg: Edema present.     Left lower leg: Edema present.     Comments: Trace 1+ edema LLE>RLE Left shoulder pain with ROM, no apparent deformity or s/s of contusion.   Skin:    General: Skin is warm and dry.     Findings: Erythema present.     Comments: Mild dark erythema mid of R+L lower legs, chronic.     Neurological:     General: No focal deficit present.     Mental Status: She is alert and oriented to person, place, and time. Mental status is at baseline.     Gait: Gait abnormal.     Comments: 5/5 muscle strength in fingers.   Psychiatric:        Mood and Affect: Mood normal.        Behavior: Behavior normal.     Labs reviewed: Recent Labs    01/18/23 0000 01/26/23 0000 03/25/23 0000  NA 140 139 137  K 3.7 4.3 4.4  CL 104 103 101  CO2 28* 24* 28*  BUN 16 13 16   CREATININE 0.8 0.9 0.9  CALCIUM  9.1 9.6 9.2   Recent Labs    07/05/22 0000 01/18/23 0000 01/26/23 0000  AST 23 23  24  ALT 18 16 17   ALKPHOS 44 37 40  ALBUMIN 3.8 3.7 3.7   Recent Labs    01/26/23 0000 03/25/23 0000 05/24/23 0000  WBC 4.6 10.8 7.6  NEUTROABS 1,697.00 7,700.00 4,355.00  HGB 13.6 14.0 13.5  HCT 41 43 41  PLT 121* 162 136*   Lab Results  Component Value Date   TSH 4.38 01/26/2023   No results found for: HGBA1C Lab Results   Component Value Date   CHOL 193 05/13/2017   HDL 41 05/13/2017   LDLCALC 122 (H) 05/13/2017   TRIG 152 (H) 05/13/2017   CHOLHDL 4.7 (H) 05/13/2017    Significant Diagnostic Results in last 30 days:  No results found.  Assessment/Plan: Left shoulder pain  left shoulder pain with ROM resulted from a mechanical fall, no apparent deformity or bruise/contusion. The patient denied headache, dizziness, SOB, palpitation, or focal weakness associated with the fall.  HPOA daughter declined ED eval presently Will obtain X-Dadamo left shoulder/humerus 3 view Tylenol  650mg  tid po with food Immobilizes left shoulder/humerus as possible.   Numbness of fingers of both hands Chronic numbness of R+L hands, has been using fingerless gloves which helped, declined workup, neurology referral, or medication  Mild cognitive impairment  tolerated Namenda  well, helped with her mood as well. MMSE 27/30 04/09/23, CT head 02/12/22 Mild age-related atrophy and chronic microvascular ischemic changes.  Persistent atrial fibrillation (HCC) Heart rate is in control, off Metoprolol , on Flecainide , Eliquis . Hgb 13.5 05/24/23  Hypertension Blood pressure is controlled,  off Metoprolol  05/27/23 2/2 low Bp, on Furosemide ,  f/u Cardiology.   Congestive heart failure (HCC) edema BLE, on Furosemide . EF 70-75% 09/19/21, 03/27/22 Venous US , negative DVT. 03/30/22 arterial US  mild to moderate obstructive disease BLE Bun/creat 19/0.87 05/24/23  Osteoarthritis, multiple sites takes Tylenol , ambulates with walker.     Family/ staff Communication: plan of care reviewed with the patient, the patient's HPOA, and charge nurse.   Labs/tests ordered:  X-Yepiz left shoulder/humerus 3 views, CBC/diff, CMP/eGFR   Time spend 40 minutes.

## 2023-06-18 NOTE — Assessment & Plan Note (Signed)
takes Tylenol, ambulates with walker.

## 2023-06-18 NOTE — Assessment & Plan Note (Addendum)
 Heart rate is in control, off Metoprolol, on Flecainide, Eliquis. Hgb 13.5 05/24/23

## 2023-06-19 DIAGNOSIS — D649 Anemia, unspecified: Secondary | ICD-10-CM | POA: Diagnosis not present

## 2023-06-20 LAB — COMPREHENSIVE METABOLIC PANEL
Albumin: 3.6 (ref 3.5–5.0)
Calcium: 9.2 (ref 8.7–10.7)
Globulin: 3
eGFR: 69

## 2023-06-20 LAB — CBC AND DIFFERENTIAL
HCT: 41 (ref 36–46)
Hemoglobin: 13.3 (ref 12.0–16.0)
Neutrophils Absolute: 2019
Platelets: 92 10*3/uL — AB (ref 150–400)
WBC: 4.6

## 2023-06-20 LAB — BASIC METABOLIC PANEL
BUN: 14 (ref 4–21)
CO2: 21 (ref 13–22)
Chloride: 105 (ref 99–108)
Creatinine: 0.8 (ref 0.5–1.1)
Glucose: 88
Potassium: 4 meq/L (ref 3.5–5.1)
Sodium: 141 (ref 137–147)

## 2023-06-20 LAB — CBC: RBC: 4.16 (ref 3.87–5.11)

## 2023-06-20 LAB — HEPATIC FUNCTION PANEL
ALT: 15 U/L (ref 7–35)
AST: 29 (ref 13–35)
Alkaline Phosphatase: 48 (ref 25–125)
Bilirubin, Total: 0.5

## 2023-06-21 ENCOUNTER — Encounter: Payer: Self-pay | Admitting: Sports Medicine

## 2023-06-21 ENCOUNTER — Non-Acute Institutional Stay: Payer: Self-pay | Admitting: Sports Medicine

## 2023-06-21 DIAGNOSIS — D696 Thrombocytopenia, unspecified: Secondary | ICD-10-CM

## 2023-06-21 DIAGNOSIS — M7989 Other specified soft tissue disorders: Secondary | ICD-10-CM | POA: Diagnosis not present

## 2023-06-21 NOTE — Progress Notes (Signed)
 Location:  [facility] Room Number: [id number] Place of Service:  ALF [phone]) Provider:  [name], MD  Patient Care Team: [name], MD as PCP - General (Internal Medicine) Kelsie Agent, MD (Inactive) as PCP - Electrophysiology (Cardiology) Hobart Powell BRAVO, MD (Inactive) as PCP - Cardiology (Cardiology) [name], FNP (Hospice and Palliative Medicine)  Extended Emergency Contact Information Primary Emergency Contact: [facility] Phone: [phone] Mobile Phone: [phone] Relation: Daughter Interpreter needed? No Secondary Emergency Contact: Pearman,Karen Address: [address]          [place], [place] [address] [place] Home Phone: [phone] Mobile Phone: [phone] Relation: Daughter  Code Status:  DNR Goals of care: Advanced Directive information    06/21/2023   12:33 PM  Advanced Directives  Does Patient Have a Medical Advance Directive? Yes  Type of Estate Agent of [place];Out of facility DNR (pink MOST or yellow form)  Does patient want to make changes to medical advance directive? No - Patient declined  Copy of Healthcare Power of Attorney in Chart? Yes - validated most recent copy scanned in chart (See row information)  Pre-existing out of facility DNR order (yellow form or pink MOST form) Yellow form placed in chart (order not valid for inpatient use)     Chief Complaint  Patient presents with   Acute Visit    low platelet count    HPI:  Pt is a 89 y.o. female seen today for an acute visit for low platelet count. Pt denies any episodes of bleeding from the nose or gums, hematuria, bloody or dark stools. Pt denies reported no chest or abdominal pain, no nausea or vomiting, and no burning sensation in the stomach.    Pt has chronic history of  thrombocytopenia     Latest Ref Rng & Units 06/20/2023   12:00 AM 05/24/2023   12:00 AM 03/25/2023   12:00 AM   CBC  WBC  4.6     7.6     10.8      Hemoglobin 12.0 - 16.0 13.3     13.5     14.0      Hematocrit 36 - 46 41     41     43      Platelets 150 - 400 K/uL 92     136     162         This result is from an external source.       Latest Ref Rng & Units 06/20/2023   12:00 AM 03/25/2023   12:00 AM 01/26/2023   12:00 AM  CMP  BUN 4 - 21 14     16     13       Creatinine 0.5 - 1.1 0.8     0.9     0.9      Sodium 137 - 147 141     137     139      Potassium 3.5 - 5.1 mEq/L 4.0     4.4     4.3      Chloride 99 - 108 105     101     103      CO2 13 - 22 21     28     24       Calcium  8.7 - 10.7 9.2     9.2     9.6      Alkaline Phos 25 - 125 48  40      AST 13 - 35 29      24      ALT 7 - 35 U/L 15      17         This result is from an external source.     Past Medical History:  Diagnosis Date   Aortic stenosis 03/08/2016   Brady-tachy syndrome (HCC)    a. Biotronik dual chamber PPM implanted 2009 b. gen change to STJ dual chamber PPM 2017   Ejection fraction    EF 70%, echo, October, 2009  //   EF 55-60%, septal dyssynergy consistent with a paced rhythm, mild to moderate mitral regurgitation, echo, November, 2015    GERD (gastroesophageal reflux disease) 04/28/2014   Episodes November, 2015 with fluid refluxing from her esophagus.    Hair loss    Patient questioned Coumadin, changed toPradaxa   Hypertension    Hypothyroidism    Lower extremity edema 01/22/2018   Mitral regurgitation 05/03/2014   Mild-to-moderate mitral regurgitation, echo, November, 2015    Osteoarthritis of left knee 10/25/2017   Paroxysmal atrial fibrillation (HCC)    Episodes rapid atrial fib noted by pacemaker interrogation, August, 2011, diltiazem  added, patient improved. Patient continues on Rythmol   //   Changed to flecainide  2013  //   flecainide  level checked in 2013, good level    Persistent atrial fibrillation (HCC)    Past Surgical History:  Procedure Laterality Date   ABDOMINAL HYSTERECTOMY      EP IMPLANTABLE DEVICE N/A 08/08/2015   Generator change with SJM Assurity DR PPM by Dr Kelsie   JOINT REPLACEMENT     PACEMAKER PLACEMENT  2009        Allergies  Allergen Reactions   Morphine And Codeine Other (See Comments)    Patient states that she went crazy with this   Penicillins Swelling and Rash        Lasix  [Furosemide ] Itching    Patient stated it made her itch all over and dry.   Aspirin      Pacemaker   Clindamycin/Lincomycin Other (See Comments)    Any meds with mycin in the name Unknown reaction   Crestor  [Rosuvastatin ]     Other reaction(s): muscle pain   Diltiazem      Skin discoloration, lower extremity swelling   Erythromycin Other (See Comments)    Any meds with mycin in the name Unknown reaction   Lisinopril Other (See Comments)    REACTION: Cough   Metronidazole     Other reaction(s): mouth sores, tongue swelling   Morphine    Penicillins Cross Reactors Other (See Comments)    Any meds with mycin in the name Unknown reaction    Outpatient Encounter Medications as of 06/21/2023  Medication Sig   acetaminophen  (TYLENOL ) 325 MG tablet Take 650 mg by mouth every 6 (six) hours as needed.   acetaminophen  (TYLENOL ) 500 MG tablet Take 1,000 mg by mouth in the morning and at bedtime.   Apoaequorin (PREVAGEN) 10 MG CAPS Take 10 mg by mouth daily.   betamethasone , augmented, (DIPROLENE ) 0.05 % gel Apply topically daily.  Apply to scalp topically every 12 hours as needed for psoriasis related to PSORIASIS, UNSPECIFIED (L40.9) apply to affected area of scalp   Calcium  Carbonate-Vit D-Min (CALCIUM  600+D PLUS MINERALS) 600-400 MG-UNIT TABS Take 1 tablet by mouth daily.   Carboxymethylcell-Glycerin  PF (REFRESH RELIEVA PF) 0.5-1 % SOLN Apply 1 drop to eye in the morning and at bedtime. for relieve  discomfort   diclofenac  Sodium (VOLTAREN ) 1 % GEL Apply 1 g topically 2 (two) times daily as needed.   ELIQUIS  5 MG TABS tablet TAKE ONE TABLET BY MOUTH TWICE A DAY    flecainide  (TAMBOCOR ) 50 MG tablet Take 1 tablet (50 mg total) by mouth 2 (two) times daily.   furosemide  (LASIX ) 20 MG tablet TAKE ONE TABLET BY MOUTH EVERY DAY   hydrocortisone 2.5 % ointment as needed (rash). Apply to affected areas topically as needed for Rashy/itchy areas two times daily-MAY SELF ADMINISTER-per pt request   Ipratropium-Albuterol  (COMBIVENT) 20-100 MCG/ACT AERS respimat Inhale 1 puff into the lungs every 6 (six) hours.   levothyroxine  (SYNTHROID ) 100 MCG tablet Take 1 tablet (100 mcg total) by mouth every morning.   Multiple Vitamin (MULTIVITAMIN ADULT) TABS Take 1 tablet by mouth daily.   Multiple Vitamins-Minerals (OCUVITE PO) Take 1 capsule by mouth daily.   NAMENDA  10 MG tablet TAKE ONE TABLET BY MOUTH TWICE A DAY FOR COGNITIVE DECLINE   potassium chloride  (KLOR-CON  M) 10 MEQ tablet Take 1 tablet (10 mEq total) by mouth daily.   sodium chloride  (MURO 128) 5 % ophthalmic ointment Place 1 Application into both eyes at bedtime.   sodium chloride  (MURO 128) 5 % ophthalmic solution Place 1 drop into both eyes in the morning and at bedtime. for decrease swelling Wait 1 hour between these and Refresh   metoprolol  succinate (TOPROL -XL) 25 MG 24 hr tablet Take 3 tablets (75 mg total) by mouth daily. (Patient not taking: Reported on 06/21/2023)   No facility-administered encounter medications on file as of 06/21/2023.    Review of Systems  Constitutional:  Negative for chills and fever.  HENT:  Negative for sinus pressure and sore throat.   Respiratory:  Negative for cough, shortness of breath and wheezing.   Cardiovascular:  Positive for leg swelling. Negative for chest pain and palpitations.  Gastrointestinal:  Negative for abdominal distention, abdominal pain, blood in stool, constipation, diarrhea, nausea and vomiting.  Genitourinary:  Negative for dysuria, frequency, hematuria and urgency.  Neurological:  Negative for dizziness.  Psychiatric/Behavioral:  Negative for  confusion.     Immunization History  Administered Date(s) Administered   Fluad Quad(high Dose 65+) 04/02/2022   Influenza Split 03/11/2009, 03/11/2010, 02/25/2011, 02/24/2019, 03/08/2020, 03/25/2021   Influenza, High Dose Seasonal PF 03/08/2015, 04/03/2017, 03/17/2018, 03/08/2020, 04/15/2023   Influenza,inj,Quad PF,6+ Mos 03/08/2016   Influenza,inj,quad, With Preservative 03/11/2017   Moderna Covid-19 Vaccine  Bivalent Booster 4yrs & up 10/27/2021   Moderna SARS-COV2 Booster Vaccination 11/08/2020   Moderna Sars-Covid-2 Vaccination 06/15/2019, 07/13/2019, 04/19/2020, 02/27/2022   Pfizer Covid-19 Vaccine Bivalent Booster 106yrs & up 02/28/2021, 04/12/2022   Pneumococcal Conjugate-13 12/06/2014   Pneumococcal Polysaccharide-23 05/13/2001, 03/11/2009   Polio, Unspecified 02/02/1958   Tdap 11/21/2011   Zoster Recombinant(Shingrix) 07/18/2022, 10/19/2022   Pertinent  Health Maintenance Due  Topic Date Due   INFLUENZA VACCINE  Completed   DEXA SCAN  Completed      02/16/2022    9:45 AM 02/27/2022   11:41 AM 07/06/2022   10:04 AM 07/13/2022    3:55 PM 03/25/2023    4:51 PM  Fall Risk  Falls in the past year?   0 0 1  Was there an injury with Fall?   0 0 0  Fall Risk Category Calculator   0 0 1  (RETIRED) Patient Fall Risk Level High fall risk High fall risk     Patient at Risk for Falls Due to  History of fall(s)  No Fall Risks No Fall Risks History of fall(s);Impaired balance/gait;Impaired mobility  Fall risk Follow up  Falls evaluation completed  Falls evaluation completed Falls evaluation completed;Falls prevention discussed;Education provided   Functional Status Survey:    Vitals:   06/21/23 1226  BP: (!) 150/68  Pulse: 68  Resp: 18  Temp: 97.9 F (36.6 C)  SpO2: 95%  Weight: 200 lb 9.6 oz (91 kg)  Height: 5' 3 (1.6 m)   Body mass index is 35.53 kg/m. Physical Exam Constitutional:      Appearance: Normal appearance.  HENT:     Head: Normocephalic and atraumatic.   Cardiovascular:     Rate and Rhythm: Normal rate and regular rhythm.  Pulmonary:     Effort: Pulmonary effort is normal. No respiratory distress.     Breath sounds: Normal breath sounds. No wheezing.  Abdominal:     General: Bowel sounds are normal. There is no distension.     Tenderness: There is no abdominal tenderness. There is no guarding or rebound.     Comments:    Musculoskeletal:        General: Swelling present. No tenderness.  Skin:    General: Skin is dry.  Neurological:     Mental Status: She is alert. Mental status is at baseline.     Sensory: No sensory deficit.     Motor: No weakness.     Labs reviewed: Recent Labs    01/26/23 0000 03/25/23 0000 06/20/23 0000  NA 139 137 141  K 4.3 4.4 4.0  CL 103 101 105  CO2 24* 28* 21  BUN 13 16 14   CREATININE 0.9 0.9 0.8  CALCIUM  9.6 9.2 9.2   Recent Labs    01/18/23 0000 01/26/23 0000 06/20/23 0000  AST 23 24 29   ALT 16 17 15   ALKPHOS 37 40 48  ALBUMIN 3.7 3.7 3.6   Recent Labs    03/25/23 0000 05/24/23 0000 06/20/23 0000  WBC 10.8 7.6 4.6  NEUTROABS 7,700.00 4,355.00 2,019.00  HGB 14.0 13.5 13.3  HCT 43 41 41  PLT 162 136* 92*   Lab Results  Component Value Date   TSH 4.38 01/26/2023   No results found for: HGBA1C Lab Results  Component Value Date   CHOL 193 05/13/2017   HDL 41 05/13/2017   LDLCALC 122 (H) 05/13/2017   TRIG 152 (H) 05/13/2017   CHOLHDL 4.7 (H) 05/13/2017    Significant Diagnostic Results in last 30 days:  No results found.  Assessment/Plan  Thrombocytopenia Noted on recent blood work.  No current signs of bleeding (no epistaxis, gingival bleeding, hematuria, or hematochezia).   -Monitor for any signs of bleeding and report to staff immediately. Will repeat cbc in 4 weeks      Lower extremity edema 1+ pitting oedema Cont with compression stockings -Continue current management and monitor for any changes.    Care plan discussed with the nursing staff   30  minTotal time spent for obtaining history,  performing a medically appropriate examination and evaluation, reviewing the tests,   documenting clinical information in the electronic or other health record, independently interpreting results ,care coordination (not separately reported)

## 2023-06-24 ENCOUNTER — Encounter: Payer: Self-pay | Admitting: Sports Medicine

## 2023-06-25 DIAGNOSIS — L602 Onychogryphosis: Secondary | ICD-10-CM | POA: Diagnosis not present

## 2023-06-26 DIAGNOSIS — Z23 Encounter for immunization: Secondary | ICD-10-CM | POA: Diagnosis not present

## 2023-06-28 ENCOUNTER — Telehealth: Payer: Self-pay

## 2023-06-28 MED ORDER — POTASSIUM CHLORIDE CRYS ER 10 MEQ PO TBCR: 10.0000 meq | EXTENDED_RELEASE_TABLET | Freq: Every day | ORAL | 3 refills | Status: AC

## 2023-06-28 NOTE — Telephone Encounter (Signed)
Medication refill

## 2023-07-01 ENCOUNTER — Non-Acute Institutional Stay: Payer: Self-pay | Admitting: Nurse Practitioner

## 2023-07-01 ENCOUNTER — Encounter: Payer: Self-pay | Admitting: Nurse Practitioner

## 2023-07-01 DIAGNOSIS — I509 Heart failure, unspecified: Secondary | ICD-10-CM | POA: Diagnosis not present

## 2023-07-01 DIAGNOSIS — I4819 Other persistent atrial fibrillation: Secondary | ICD-10-CM

## 2023-07-01 DIAGNOSIS — F0390 Unspecified dementia without behavioral disturbance: Secondary | ICD-10-CM | POA: Diagnosis not present

## 2023-07-01 DIAGNOSIS — R200 Anesthesia of skin: Secondary | ICD-10-CM

## 2023-07-01 DIAGNOSIS — K625 Hemorrhage of anus and rectum: Secondary | ICD-10-CM | POA: Diagnosis not present

## 2023-07-01 DIAGNOSIS — E039 Hypothyroidism, unspecified: Secondary | ICD-10-CM | POA: Diagnosis not present

## 2023-07-01 DIAGNOSIS — R32 Unspecified urinary incontinence: Secondary | ICD-10-CM

## 2023-07-01 DIAGNOSIS — I10 Essential (primary) hypertension: Secondary | ICD-10-CM | POA: Diagnosis not present

## 2023-07-01 NOTE — Assessment & Plan Note (Signed)
edema BLE, on Furosemide. EF 70-75% 09/19/21, 03/27/22 Venous US, negative DVT. 03/30/22 arterial US mild to moderate obstructive disease BLE             CKD Bun/creat 14/0.8 06/20/23

## 2023-07-01 NOTE — Assessment & Plan Note (Signed)
reported 06/30/23 the patient's daughter reported the the patient having a hard time urinating and pain is in abdomen. 07/01/23 the patient state she is feeling well, denied dysuria, abd pain, urinary urgency, she is afebrile.  Continue to observe for s/s of UTI

## 2023-07-01 NOTE — Assessment & Plan Note (Signed)
tolerated Namenda well, helped with her mood as well. MMSE 27/30 04/09/23, CT head 02/12/22 Mild age-related atrophy and chronic microvascular ischemic changes.

## 2023-07-01 NOTE — Assessment & Plan Note (Signed)
BRBPR 05/23/23, FOBT positive 05/24/23, noted hemorrhoids, placed PPI, H+H wnl 06/20/23

## 2023-07-01 NOTE — Assessment & Plan Note (Signed)
Blood pressure is in control, off Metoprolol 05/27/23 2/2 low Bp, on Furosemide,  f/u Cardiology.

## 2023-07-01 NOTE — Progress Notes (Signed)
Location:   [facility] Room Number: 824 Place of Service:  ALF (13) Provider: Arna Snipe Dane Bloch NP  Venita Sheffield, MD  Patient Care Team: Venita Sheffield, MD as PCP - General (Internal Medicine) [name], MD (Inactive) as PCP - Electrophysiology (Cardiology) [name], MD (Inactive) as PCP - Cardiology (Cardiology) Lurline Idol, FNP (Hospice and Palliative Medicine)  Extended Emergency Contact Information Primary Emergency Contact: [facility] Phone: [phone] Mobile Phone: [phone] Relation: Daughter Interpreter needed? No Secondary Emergency Contact: Pearman,Karen Address: [address]          [place], [place] [address] [place] Home Phone: [phone] Mobile Phone: [phone] Relation: Daughter  Code Status: DNR Goals of care: Advanced Directive information    06/21/2023   12:33 PM  Advanced Directives  Does Patient Have a Medical Advance Directive? Yes  Type of Estate agent of [place];Out of facility DNR (pink MOST or yellow form)  Does patient want to make changes to medical advance directive? No - Patient declined  Copy of Healthcare Power of Attorney in Chart? Yes - validated most recent copy scanned in chart (See row information)  Pre-existing out of facility DNR order (yellow form or pink MOST form) Yellow form placed in chart (order not valid for inpatient use)     Chief Complaint  Patient presents with   Acute Visit    Not feeling well, urinary frequency and burning sensation upon urination    HPI:  Pt is a 89 y.o. female seen today for an acute visit for reported 06/30/23 the patient's daughter reported the the patient having a hard time urinating and pain is in abdomen. 07/01/23 the patient state she is feeling well, denied dysuria, abd pain, urinary urgency, she is afebrile.      BRBPR 05/23/23, FOBT positive 05/24/23, noted hemorrhoids, placed PPI, H+H wnl 06/20/23               01/11/23 family expressed concerning of hand and fingers tingling and numbness, desires checking TSH 4.38 01/26/23 If Namenda is the culprit. The patient stated her tingling and numbness in hands and fingers has been the same, declined workup or neurology referral.                           Chronic numbness of R+L hands, has been using fingerless gloves which helped, declined workup, neurology referral, or medication             Senile dementia, tolerated Namenda well, helped with her mood as well. MMSE 27/30 04/09/23, CT head 02/12/22 Mild age-related atrophy and chronic microvascular ischemic changes.             Afib, off Metoprolol, on Flecainide, Eliquis. Hgb 13.3 06/20/23             AS moderate             HTN off Metoprolol 05/27/23 2/2 low Bp, on Furosemide,  f/u Cardiology.              Pacemaker, f/u Cardiology             CHF, edema BLE, on Furosemide. EF 70-75% 09/19/21, 03/27/22 Venous US, negative DVT. 03/30/22 arterial US mild to moderate obstructive disease BLE             CKD Bun/creat 14/0.8 06/20/23             Dysphagia, nectar liquid.  Hypothyroidism, taking Levothyroxine, TSH 4.38 01/25/23             OA, takes Tylenol, ambulates with walker.              Hypokalemia, K 4.0 06/20/23    Past Medical History:  Diagnosis Date   Aortic stenosis 03/08/2016   Brady-tachy syndrome (HCC)    a. Biotronik dual chamber PPM implanted 2009 b. gen change to STJ dual chamber PPM 2017   Ejection fraction    EF 70%, echo, October, 2009  //   EF 55-60%, septal dyssynergy consistent with a paced rhythm, mild to moderate mitral regurgitation, echo, November, 2015    GERD (gastroesophageal reflux disease) 04/28/2014   Episodes November, 2015 with fluid refluxing from her esophagus.    Hair loss    Patient questioned Coumadin, changed toPradaxa   Hypertension    Hypothyroidism    Lower extremity edema 01/22/2018   Mitral regurgitation 05/03/2014   Mild-to-moderate mitral  regurgitation, echo, November, 2015    Osteoarthritis of left knee 10/25/2017   Paroxysmal atrial fibrillation (HCC)    Episodes rapid atrial fib noted by pacemaker interrogation, August, 2011, diltiazem added, patient improved. Patient continues on Rythmol  //   Changed to flecainide 2013  //   flecainide level checked in 2013, good level    Persistent atrial fibrillation (HCC)    Past Surgical History:  Procedure Laterality Date   ABDOMINAL HYSTERECTOMY     EP IMPLANTABLE DEVICE N/A 08/08/2015   Generator change with SJM Assurity DR PPM by Dr Johney Frame   JOINT REPLACEMENT     PACEMAKER PLACEMENT  2009             Review of Systems  Constitutional:  Negative for appetite change, fatigue and fever.  HENT:  Positive for hearing loss and trouble swallowing. Negative for congestion.   Eyes:  Negative for visual disturbance.  Respiratory:  Negative for cough, chest tightness and shortness of breath.   Cardiovascular:  Positive for leg swelling. Negative for chest pain and palpitations.  Gastrointestinal:  Negative for abdominal pain and constipation.  Genitourinary:  Negative for dysuria, frequency, hematuria and urgency.  Musculoskeletal:  Positive for arthralgias and gait problem.       Knees, L>R, intermittent in nature.  Left shoulder pain with ROM  Skin:  Negative for color change.       Chronic venous insufficiency skin changes: mild erythema in mid of R+L   Neurological:  Positive for numbness. Negative for tremors, speech difficulty, weakness and headaches.       Memory lapses. chronic numbness in fingers.   Psychiatric/Behavioral:  Negative for confusion and sleep disturbance. The patient is not nervous/anxious.     Immunization History  Administered Date(s) Administered   Fluad Quad(high Dose 65+) 04/02/2022   Influenza Split 03/11/2009, 03/11/2010, 02/25/2011, 02/24/2019, 03/08/2020, 03/25/2021   Influenza, High Dose Seasonal PF 03/08/2015, 04/03/2017, 03/17/2018,  03/08/2020, 04/15/2023   Influenza,inj,Quad PF,6+ Mos 03/08/2016   Influenza,inj,quad, With Preservative 03/11/2017   Moderna Covid-19 Vaccine Bivalent Booster 33yrs & up 10/27/2021   Moderna SARS-COV2 Booster Vaccination 11/08/2020   Moderna Sars-Covid-2 Vaccination 06/15/2019, 07/13/2019, 04/19/2020, 02/27/2022   Pfizer Covid-19 Vaccine Bivalent Booster 67yrs & up 02/28/2021, 04/12/2022   Pneumococcal Conjugate-13 12/06/2014   Pneumococcal Polysaccharide-23 05/13/2001, 03/11/2009   Polio, Unspecified 02/02/1958   Tdap 11/21/2011   Zoster Recombinant(Shingrix) 07/18/2022, 10/19/2022   Pertinent  Health Maintenance Due  Topic Date Due   INFLUENZA VACCINE  Completed   DEXA  SCAN  Completed      02/16/2022    9:45 AM 02/27/2022   11:41 AM 07/06/2022   10:04 AM 07/13/2022    3:55 PM 03/25/2023    4:51 PM  Fall Risk  Falls in the past year?   0 0 1  Was there an injury with Fall?   0 0 0  Fall Risk Category Calculator   0 0 1  (RETIRED) Patient Fall Risk Level High fall risk High fall risk     Patient at Risk for Falls Due to  History of fall(s) No Fall Risks No Fall Risks History of fall(s);Impaired balance/gait;Impaired mobility  Fall risk Follow up  Falls evaluation completed  Falls evaluation completed Falls evaluation completed;Falls prevention discussed;Education provided   Functional Status Survey:    Vitals:   07/01/23 1159  BP: (!) 150/60  Pulse: 71  Resp: 16  Temp: 98 F (36.7 C)  SpO2: 94%  Weight: 200 lb 9.6 oz (91 kg)   Body mass index is 35.53 kg/m. Physical Exam Vitals and nursing note reviewed.  Constitutional:      Appearance: Normal appearance.  HENT:     Head: Normocephalic and atraumatic.     Nose: Nose normal.     Mouth/Throat:     Mouth: Mucous membranes are moist.  Eyes:     Extraocular Movements: Extraocular movements intact.     Conjunctiva/sclera: Conjunctivae normal.     Pupils: Pupils are equal, round, and reactive to light.   Cardiovascular:     Rate and Rhythm: Normal rate. Rhythm irregular.     Heart sounds: Murmur heard.     Comments: No dorsalis pedis pulses felt from previous examination.  Pulmonary:     Effort: Pulmonary effort is normal.     Breath sounds: No rales.  Abdominal:     General: Bowel sounds are normal.     Palpations: Abdomen is soft.     Tenderness: There is no abdominal tenderness. There is no right CVA tenderness, left CVA tenderness, guarding or rebound.  Genitourinary:    Comments: External hemorrhoids from previous examination Musculoskeletal:        General: Tenderness present. Normal range of motion.     Cervical back: Normal range of motion and neck supple.     Right lower leg: Edema present.     Left lower leg: Edema present.     Comments: Trace 1+ edema LLE>RLE Left shoulder pain with ROM, no apparent deformity or s/s of contusion.   Skin:    General: Skin is warm and dry.     Findings: Erythema present.     Comments: Mild dark erythema mid of R+L lower legs, chronic.     Neurological:     General: No focal deficit present.     Mental Status: She is alert and oriented to person, place, and time. Mental status is at baseline.     Gait: Gait abnormal.     Comments: 5/5 muscle strength in fingers.   Psychiatric:        Mood and Affect: Mood normal.        Behavior: Behavior normal.     Labs reviewed: Recent Labs    01/26/23 0000 03/25/23 0000 06/20/23 0000  NA 139 137 141  K 4.3 4.4 4.0  CL 103 101 105  CO2 24* 28* 21  BUN 13 16 14   CREATININE 0.9 0.9 0.8  CALCIUM 9.6 9.2 9.2   Recent Labs    01/18/23 0000  01/26/23 0000 06/20/23 0000  AST 23 24 29   ALT 16 17 15   ALKPHOS 37 40 48  ALBUMIN 3.7 3.7 3.6   Recent Labs    03/25/23 0000 05/24/23 0000 06/20/23 0000  WBC 10.8 7.6 4.6  NEUTROABS 7,700.00 4,355.00 2,019.00  HGB 14.0 13.5 13.3  HCT 43 41 41  PLT 162 136* 92*   Lab Results  Component Value Date   TSH 4.38 01/26/2023   No results  found for: "HGBA1C" Lab Results  Component Value Date   CHOL 193 05/13/2017   HDL 41 05/13/2017   LDLCALC 122 (H) 05/13/2017   TRIG 152 (H) 05/13/2017   CHOLHDL 4.7 (H) 05/13/2017    Significant Diagnostic Results in last 30 days:  No results found.  Assessment/Plan: Urinary incontinence reported 06/30/23 the patient's daughter reported the the patient having a hard time urinating and pain is in abdomen. 07/01/23 the patient state she is feeling well, denied dysuria, abd pain, urinary urgency, she is afebrile.  Continue to observe for s/s of UTI  BRBPR (bright red blood per rectum) BRBPR 05/23/23, FOBT positive 05/24/23, noted hemorrhoids, placed PPI, H+H wnl 06/20/23  Numbness of fingers of both hands Chronic numbness of R+L hands, has been using fingerless gloves which helped, declined workup, neurology referral, or medication  Senile dementia (HCC)  tolerated Namenda well, helped with her mood as well. MMSE 27/30 04/09/23, CT head 02/12/22 Mild age-related atrophy and chronic microvascular ischemic changes.  Persistent atrial fibrillation (HCC) Heart rate is in control,  off Metoprolol, on Flecainide, Eliquis. Hgb 13.3 06/20/23  Hypertension Blood pressure is in control, off Metoprolol 05/27/23 2/2 low Bp, on Furosemide,  f/u Cardiology.   Congestive heart failure (HCC) edema BLE, on Furosemide. EF 70-75% 09/19/21, 03/27/22 Venous US, negative DVT. 03/30/22 arterial US mild to moderate obstructive disease BLE             CKD Bun/creat 14/0.8 06/20/23  Hypothyroidism  taking Levothyroxine, TSH 4.38 01/25/23    Family/ staff Communication: plan of care reviewed with the patient and charge nurse.   Labs/tests ordered:  none  Time spend 40 minutes.

## 2023-07-01 NOTE — Assessment & Plan Note (Signed)
Chronic numbness of R+L hands, has been using fingerless gloves which helped, declined workup, neurology referral, or medication

## 2023-07-01 NOTE — Assessment & Plan Note (Signed)
taking Levothyroxine, TSH 4.38 01/25/23

## 2023-07-01 NOTE — Assessment & Plan Note (Signed)
Heart rate is in control,  off Metoprolol, on Flecainide, Eliquis. Hgb 13.3 06/20/23

## 2023-07-12 ENCOUNTER — Ambulatory Visit: Payer: Medicare Other

## 2023-07-12 ENCOUNTER — Encounter: Payer: Self-pay | Admitting: Sports Medicine

## 2023-07-12 ENCOUNTER — Non-Acute Institutional Stay: Payer: Medicare Other | Admitting: Sports Medicine

## 2023-07-12 DIAGNOSIS — M7989 Other specified soft tissue disorders: Secondary | ICD-10-CM | POA: Diagnosis not present

## 2023-07-12 DIAGNOSIS — I495 Sick sinus syndrome: Secondary | ICD-10-CM | POA: Diagnosis not present

## 2023-07-12 DIAGNOSIS — M25512 Pain in left shoulder: Secondary | ICD-10-CM

## 2023-07-12 LAB — CUP PACEART REMOTE DEVICE CHECK
Battery Remaining Longevity: 38 mo
Battery Remaining Percentage: 30 %
Battery Voltage: 2.95 V
Brady Statistic AP VP Percent: 1 %
Brady Statistic AP VS Percent: 35 %
Brady Statistic AS VP Percent: 1 %
Brady Statistic AS VS Percent: 65 %
Brady Statistic RA Percent Paced: 35 %
Brady Statistic RV Percent Paced: 1 %
Date Time Interrogation Session: 20250131040014
Implantable Lead Connection Status: 753985
Implantable Lead Connection Status: 753985
Implantable Lead Implant Date: 20091109
Implantable Lead Implant Date: 20091109
Implantable Lead Location: 753859
Implantable Lead Location: 753860
Implantable Lead Model: 350
Implantable Lead Serial Number: 24891460
Implantable Lead Serial Number: 28411861
Implantable Pulse Generator Implant Date: 20170227
Lead Channel Impedance Value: 1250 Ohm
Lead Channel Impedance Value: 450 Ohm
Lead Channel Pacing Threshold Amplitude: 1 V
Lead Channel Pacing Threshold Amplitude: 1 V
Lead Channel Pacing Threshold Pulse Width: 0.4 ms
Lead Channel Pacing Threshold Pulse Width: 0.4 ms
Lead Channel Sensing Intrinsic Amplitude: 1.1 mV
Lead Channel Sensing Intrinsic Amplitude: 12 mV
Lead Channel Setting Pacing Amplitude: 2 V
Lead Channel Setting Pacing Amplitude: 2.5 V
Lead Channel Setting Pacing Pulse Width: 0.4 ms
Lead Channel Setting Sensing Sensitivity: 2 mV
Pulse Gen Model: 2240
Pulse Gen Serial Number: 7885781

## 2023-07-12 NOTE — Progress Notes (Signed)
Location:  [facility] Room Number: [id number] Place of Service:  ALF [phone]) Provider:  Venita Sheffield, MD  Patient Care Team: Venita Sheffield, MD as PCP - General (Internal Medicine) [name], MD (Inactive) as PCP - Electrophysiology (Cardiology) [name], MD (Inactive) as PCP - Cardiology (Cardiology) Lurline Idol, FNP (Hospice and Palliative Medicine)  Extended Emergency Contact Information Primary Emergency Contact: [facility] Phone: [phone] Mobile Phone: [phone] Relation: Daughter Interpreter needed? No Secondary Emergency Contact: Pearman,Karen Address: [address]          [place], [place] [address] [place] Home Phone: [phone] Mobile Phone: [phone] Relation: Daughter  Code Status:  DNR Goals of care: Advanced Directive information    07/12/2023   10:43 AM  Advanced Directives  Does Patient Have a Medical Advance Directive? Yes  Type of Estate agent of [place];Out of facility DNR (pink MOST or yellow form)  Does patient want to make changes to medical advance directive? No - Patient declined  Copy of Healthcare Power of Attorney in Chart? Yes - validated most recent copy scanned in chart (See row information)  Pre-existing out of facility DNR order (yellow form or pink MOST form) Yellow form placed in chart (order not valid for inpatient use)     Chief Complaint  Patient presents with   Acute Visit    Left shoulder pain     HPI:  Pt is a 89 y.o. female seen today for an acute visit for Left shoulder pain     She has been experiencing left shoulder pain for at least two weeks. The pain is described as achy and is exacerbated when she attempts to lift her arm . It is particularly severe, rating close to ten out of ten when lifting, and interferes with her ability to clean herself after a bowel movement.  She recalls a fall a couple of weeks ago,  after which an x-Dirosa was performed. The x-Kuang did not reveal any fractures but did show some arthritis in the shoulder. She is able to lift her arm but experiences pain during the movement and cannot lift anything with it.  She sleeps in a recliner to manage the discomfort. She denies any issues with grip strength, as she can squeeze with her hand without difficulty.  No breathing problems, chest pain, abdominal pain, burning during urination, or blood in her urine or stool.         Past Medical History:  Diagnosis Date   Aortic stenosis 03/08/2016   Brady-tachy syndrome (HCC)    a. Biotronik dual chamber PPM implanted 2009 b. gen change to STJ dual chamber PPM 2017   Ejection fraction    EF 70%, echo, October, 2009  //   EF 55-60%, septal dyssynergy consistent with a paced rhythm, mild to moderate mitral regurgitation, echo, November, 2015    GERD (gastroesophageal reflux disease) 04/28/2014   Episodes November, 2015 with fluid refluxing from her esophagus.    Hair loss    Patient questioned Coumadin, changed toPradaxa   Hypertension    Hypothyroidism    Lower extremity edema 01/22/2018   Mitral regurgitation 05/03/2014   Mild-to-moderate mitral regurgitation, echo, November, 2015    Osteoarthritis of left knee 10/25/2017   Paroxysmal atrial fibrillation (HCC)    Episodes rapid atrial fib noted by pacemaker interrogation, August, 2011, diltiazem added, patient improved. Patient continues on Rythmol  //   Changed to flecainide 2013  //   flecainide level  checked in 2013, good level    Persistent atrial fibrillation (HCC)    Past Surgical History:  Procedure Laterality Date   ABDOMINAL HYSTERECTOMY     EP IMPLANTABLE DEVICE N/A 08/08/2015   Generator change with SJM Assurity DR PPM by Dr Johney Frame   JOINT REPLACEMENT     PACEMAKER PLACEMENT  2009        Allergies  Allergen Reactions   Morphine And Codeine Other (See Comments)    Patient states that she went "crazy" with this    Penicillins Swelling and Rash        Lasix [Furosemide] Itching    Patient stated it made her itch all over and dry.   Aspirin     Pacemaker   Clindamycin/Lincomycin Other (See Comments)    Any meds with mycin in the name Unknown reaction   Crestor [Rosuvastatin]     Other reaction(s): muscle pain   Diltiazem     Skin discoloration, lower extremity swelling   Erythromycin Other (See Comments)    Any meds with mycin in the name Unknown reaction   Lisinopril Other (See Comments)    REACTION: Cough   Metronidazole     Other reaction(s): mouth sores, tongue swelling   Morphine    Penicillins Cross Reactors Other (See Comments)    Any meds with mycin in the name Unknown reaction    Outpatient Encounter Medications as of 07/12/2023  Medication Sig   acetaminophen (TYLENOL) 325 MG tablet Take 650 mg by mouth every 6 (six) hours as needed.   acetaminophen (TYLENOL) 500 MG tablet Take 1,000 mg by mouth in the morning and at bedtime.   Apoaequorin (PREVAGEN) 10 MG CAPS Take 10 mg by mouth daily.   betamethasone, augmented, (DIPROLENE) 0.05 % gel Apply topically daily.  Apply to scalp topically every 12 hours as needed for psoriasis related to PSORIASIS, UNSPECIFIED (L40.9) apply to affected area of scalp   Calcium Carbonate-Vit D-Min (CALCIUM 600+D PLUS MINERALS) 600-400 MG-UNIT TABS Take 1 tablet by mouth daily.   Carboxymethylcell-Glycerin PF (REFRESH RELIEVA PF) 0.5-1 % SOLN Apply 1 drop to eye in the morning and at bedtime. for relieve discomfort   diclofenac Sodium (VOLTAREN) 1 % GEL Apply 1 g topically 2 (two) times daily as needed.   ELIQUIS 5 MG TABS tablet TAKE ONE TABLET BY MOUTH TWICE A DAY   flecainide (TAMBOCOR) 50 MG tablet Take 1 tablet (50 mg total) by mouth 2 (two) times daily.   furosemide (LASIX) 20 MG tablet TAKE ONE TABLET BY MOUTH EVERY DAY   hydrocortisone 2.5 % ointment as needed (rash). Apply to affected areas topically as needed for Rashy/itchy areas two times  daily-MAY SELF ADMINISTER-per pt request   Ipratropium-Albuterol (COMBIVENT) 20-100 MCG/ACT AERS respimat Inhale 1 puff into the lungs every 6 (six) hours.   levothyroxine (SYNTHROID) 100 MCG tablet Take 1 tablet (100 mcg total) by mouth every morning.   Multiple Vitamin (MULTIVITAMIN ADULT) TABS Take 1 tablet by mouth daily.   Multiple Vitamins-Minerals (OCUVITE PO) Take 1 capsule by mouth daily.   NAMENDA 10 MG tablet TAKE ONE TABLET BY MOUTH TWICE A DAY FOR COGNITIVE DECLINE   nystatin cream (MYCOSTATIN) Apply 1 Application topically every 12 (twelve) hours as needed for dry skin.   pantoprazole (PROTONIX) 40 MG tablet Take 40 mg by mouth daily.   potassium chloride (KLOR-CON M) 10 MEQ tablet Take 1 tablet (10 mEq total) by mouth daily.   sodium chloride (MURO 128) 5 % ophthalmic  ointment Place 1 Application into both eyes at bedtime.   sodium chloride (MURO 128) 5 % ophthalmic solution Place 1 drop into both eyes in the morning and at bedtime. for decrease swelling Wait 1 hour between these and Refresh   metoprolol succinate (TOPROL-XL) 25 MG 24 hr tablet Take 3 tablets (75 mg total) by mouth daily. (Patient not taking: Reported on 06/13/2023)   No facility-administered encounter medications on file as of 07/12/2023.    Review of Systems  Constitutional:  Negative for chills and fever.  HENT:  Negative for sinus pressure and sore throat.   Respiratory:  Negative for cough, shortness of breath and wheezing.   Cardiovascular:  Positive for leg swelling. Negative for chest pain and palpitations.  Gastrointestinal:  Negative for abdominal distention, abdominal pain, blood in stool, constipation, diarrhea, nausea and vomiting.  Genitourinary:  Negative for dysuria, frequency and urgency.  Musculoskeletal:  Positive for arthralgias.       Left shoulder pain  Neurological:  Negative for dizziness, weakness and numbness.  Psychiatric/Behavioral:  Negative for confusion.     Immunization History   Administered Date(s) Administered   Fluad Quad(high Dose 65+) 04/02/2022   Influenza Split 03/11/2009, 03/11/2010, 02/25/2011, 02/24/2019, 03/08/2020, 03/25/2021   Influenza, High Dose Seasonal PF 03/08/2015, 04/03/2017, 03/17/2018, 03/08/2020, 04/15/2023   Influenza,inj,Quad PF,6+ Mos 03/08/2016   Influenza,inj,quad, With Preservative 03/11/2017   Moderna Covid-19 Vaccine Bivalent Booster 72yrs & up 10/27/2021   Moderna SARS-COV2 Booster Vaccination 11/08/2020   Moderna Sars-Covid-2 Vaccination 06/15/2019, 07/13/2019, 04/19/2020, 02/27/2022   Pfizer Covid-19 Vaccine Bivalent Booster 48yrs & up 02/28/2021, 04/12/2022   Pneumococcal Conjugate-13 12/06/2014   Pneumococcal Polysaccharide-23 05/13/2001, 03/11/2009   Polio, Unspecified 02/02/1958   Tdap 11/21/2011   Zoster Recombinant(Shingrix) 07/18/2022, 10/19/2022   Pertinent  Health Maintenance Due  Topic Date Due   INFLUENZA VACCINE  Completed   DEXA SCAN  Completed      02/16/2022    9:45 AM 02/27/2022   11:41 AM 07/06/2022   10:04 AM 07/13/2022    3:55 PM 03/25/2023    4:51 PM  Fall Risk  Falls in the past year?   0 0 1  Was there an injury with Fall?   0 0 0  Fall Risk Category Calculator   0 0 1  (RETIRED) Patient Fall Risk Level High fall risk High fall risk     Patient at Risk for Falls Due to  History of fall(s) No Fall Risks No Fall Risks History of fall(s);Impaired balance/gait;Impaired mobility  Fall risk Follow up  Falls evaluation completed  Falls evaluation completed Falls evaluation completed;Falls prevention discussed;Education provided   Functional Status Survey:    Vitals:   07/12/23 1042  BP: (!) 140/58  Pulse: 67  Resp: 17  Temp: 97.9 F (36.6 C)  SpO2: 94%  Weight: 200 lb 9.6 oz (91 kg)  Height: 5\' 3"  (1.6 m)   Body mass index is 35.53 kg/m. Physical Exam Constitutional:      Appearance: Normal appearance.  HENT:     Head: Normocephalic and atraumatic.  Cardiovascular:     Rate and Rhythm:  Normal rate and regular rhythm.  Pulmonary:     Effort: Pulmonary effort is normal. No respiratory distress.     Breath sounds: Normal breath sounds. No wheezing.  Abdominal:     General: Bowel sounds are normal. There is no distension.     Tenderness: There is no abdominal tenderness. There is no guarding or rebound.     Comments:  Musculoskeletal:        General: Swelling (chronic) present. No tenderness.     Comments: Left shoulder - tenderness anteriorily  Empty can positive Abduction limited to 70 degree on left side Good grip strength   Neurological:     Mental Status: She is alert. Mental status is at baseline.     Sensory: No sensory deficit.     Motor: No weakness.     Labs reviewed: Recent Labs    01/26/23 0000 03/25/23 0000 06/20/23 0000  NA 139 137 141  K 4.3 4.4 4.0  CL 103 101 105  CO2 24* 28* 21  BUN 13 16 14   CREATININE 0.9 0.9 0.8  CALCIUM 9.6 9.2 9.2   Recent Labs    01/18/23 0000 01/26/23 0000 06/20/23 0000  AST 23 24 29   ALT 16 17 15   ALKPHOS 37 40 48  ALBUMIN 3.7 3.7 3.6   Recent Labs    03/25/23 0000 05/24/23 0000 06/20/23 0000  WBC 10.8 7.6 4.6  NEUTROABS 7,700.00 4,355.00 2,019.00  HGB 14.0 13.5 13.3  HCT 43 41 41  PLT 162 136* 92*   Lab Results  Component Value Date   TSH 4.38 01/26/2023   No results found for: "HGBA1C" Lab Results  Component Value Date   CHOL 193 05/13/2017   HDL 41 05/13/2017   LDLCALC 122 (H) 05/13/2017   TRIG 152 (H) 05/13/2017   CHOLHDL 4.7 (H) 05/13/2017    Significant Diagnostic Results in last 30 days:  No results found.  Assessment/Plan   1. Acute pain of left shoulder (Primary) Left Shoulder Pain Pain for two weeks, likely due to a fall.  X-Vogan showed  IMPRESSION  No definite radiographic evidence of acute fracture or dislocation  Mild osteopenia  Mild osteoarthritis  Pain is achy, exacerbated by lifting the arm, and interferes with daily activities such as personal  hygiene. -Increase Tylenol to 1000mg  three times a day. -Apply Lidocaine patch to the left shoulder. -Order physical therapy for shoulder exercises. -Consider referral to orthopedic doctor for possible joint injections.  2. Swelling of lower extremity Elevate feet  Use compression stockings Cont with lasix    Other orders - nystatin cream (MYCOSTATIN); Apply 1 Application topically every 12 (twelve) hours as needed for dry skin. - pantoprazole (PROTONIX) 40 MG tablet; Take 40 mg by mouth daily.   30 min Total time spent for obtaining history,  performing a medically appropriate examination and evaluation, reviewing the tests,   documenting clinical information in the electronic or other health record,  ,care coordination (not separately reported)

## 2023-07-14 ENCOUNTER — Encounter: Payer: Self-pay | Admitting: Cardiology

## 2023-07-15 DIAGNOSIS — H52223 Regular astigmatism, bilateral: Secondary | ICD-10-CM | POA: Diagnosis not present

## 2023-07-15 DIAGNOSIS — H5202 Hypermetropia, left eye: Secondary | ICD-10-CM | POA: Diagnosis not present

## 2023-07-15 DIAGNOSIS — H18513 Endothelial corneal dystrophy, bilateral: Secondary | ICD-10-CM | POA: Diagnosis not present

## 2023-07-15 DIAGNOSIS — H524 Presbyopia: Secondary | ICD-10-CM | POA: Diagnosis not present

## 2023-07-15 DIAGNOSIS — H02421 Myogenic ptosis of right eyelid: Secondary | ICD-10-CM | POA: Diagnosis not present

## 2023-07-15 DIAGNOSIS — Z961 Presence of intraocular lens: Secondary | ICD-10-CM | POA: Diagnosis not present

## 2023-07-15 DIAGNOSIS — H35373 Puckering of macula, bilateral: Secondary | ICD-10-CM | POA: Diagnosis not present

## 2023-07-19 DIAGNOSIS — R29898 Other symptoms and signs involving the musculoskeletal system: Secondary | ICD-10-CM | POA: Diagnosis not present

## 2023-07-19 DIAGNOSIS — M6281 Muscle weakness (generalized): Secondary | ICD-10-CM | POA: Diagnosis not present

## 2023-07-23 DIAGNOSIS — M6281 Muscle weakness (generalized): Secondary | ICD-10-CM | POA: Diagnosis not present

## 2023-07-23 DIAGNOSIS — R29898 Other symptoms and signs involving the musculoskeletal system: Secondary | ICD-10-CM | POA: Diagnosis not present

## 2023-07-25 DIAGNOSIS — Z79899 Other long term (current) drug therapy: Secondary | ICD-10-CM | POA: Diagnosis not present

## 2023-07-25 DIAGNOSIS — K649 Unspecified hemorrhoids: Secondary | ICD-10-CM | POA: Diagnosis not present

## 2023-07-30 DIAGNOSIS — R29898 Other symptoms and signs involving the musculoskeletal system: Secondary | ICD-10-CM | POA: Diagnosis not present

## 2023-07-30 DIAGNOSIS — M6281 Muscle weakness (generalized): Secondary | ICD-10-CM | POA: Diagnosis not present

## 2023-08-01 DIAGNOSIS — R29898 Other symptoms and signs involving the musculoskeletal system: Secondary | ICD-10-CM | POA: Diagnosis not present

## 2023-08-01 DIAGNOSIS — M6281 Muscle weakness (generalized): Secondary | ICD-10-CM | POA: Diagnosis not present

## 2023-08-06 DIAGNOSIS — R29898 Other symptoms and signs involving the musculoskeletal system: Secondary | ICD-10-CM | POA: Diagnosis not present

## 2023-08-06 DIAGNOSIS — M6281 Muscle weakness (generalized): Secondary | ICD-10-CM | POA: Diagnosis not present

## 2023-08-09 ENCOUNTER — Encounter: Payer: Self-pay | Admitting: Sports Medicine

## 2023-08-09 ENCOUNTER — Non-Acute Institutional Stay: Payer: Self-pay | Admitting: Sports Medicine

## 2023-08-09 DIAGNOSIS — E039 Hypothyroidism, unspecified: Secondary | ICD-10-CM

## 2023-08-09 DIAGNOSIS — I509 Heart failure, unspecified: Secondary | ICD-10-CM

## 2023-08-09 DIAGNOSIS — I4819 Other persistent atrial fibrillation: Secondary | ICD-10-CM | POA: Diagnosis not present

## 2023-08-09 DIAGNOSIS — H109 Unspecified conjunctivitis: Secondary | ICD-10-CM | POA: Diagnosis not present

## 2023-08-09 NOTE — Progress Notes (Signed)
 Provider:  Dr. Venita Sheffield Location:  [facility] Place of Service:   Assisted living   PCP: Venita Sheffield, MD Patient Care Team: Venita Sheffield, MD as PCP - General (Internal Medicine) [name], MD (Inactive) as PCP - Electrophysiology (Cardiology) [name], MD (Inactive) as PCP - Cardiology (Cardiology) Lurline Idol, FNP (Hospice and Palliative Medicine)  Extended Emergency Contact Information Primary Emergency Contact: [facility] Phone: [phone] Mobile Phone: [phone] Relation: Daughter Interpreter needed? No Secondary Emergency Contact: Pearman,Karen Address: [address]          [place], [place] [address] [place] Home Phone: [phone] Mobile Phone: [phone] Relation: Daughter  Goals of Care: Advanced Directive information    07/12/2023   10:43 AM  Advanced Directives  Does Patient Have a Medical Advance Directive? Yes  Type of Estate agent of [place];Out of facility DNR (pink MOST or yellow form)  Does patient want to make changes to medical advance directive? No - Patient declined  Copy of Healthcare Power of Attorney in Chart? Yes - validated most recent copy scanned in chart (See row information)  Pre-existing out of facility DNR order (yellow form or pink MOST form) Yellow form placed in chart (order not valid for inpatient use)       History of Present Illness        89 yr old F with h/o CHF, Hypothyroidism, dementia,CHF, HTN is seen today for acute visit for Rt eye redness  Pt seen and examined in the living room  Her daughter is available for the visit Pt c/o redness and itching in her Rt eye since few days Noticed clear discharged and states she need to pull eye eyelids to open her eyes in the morning Denies pain with eye movement and blurring of vision  Daughter reports that she just saw ophthalmology.  Pt denies runny nose, nasal  congestion, sore throat, cough, sob, chest pain, abdominal pain, nausea, vomiting, dysuria, hematuria, bloody or dark stools.    Past Medical History:  Diagnosis Date   Aortic stenosis 03/08/2016   Brady-tachy syndrome (HCC)    a. Biotronik dual chamber PPM implanted 2009 b. gen change to STJ dual chamber PPM 2017   Ejection fraction    EF 70%, echo, October, 2009  //   EF 55-60%, septal dyssynergy consistent with a paced rhythm, mild to moderate mitral regurgitation, echo, November, 2015    GERD (gastroesophageal reflux disease) 04/28/2014   Episodes November, 2015 with fluid refluxing from her esophagus.    Hair loss    Patient questioned Coumadin, changed toPradaxa   Hypertension    Hypothyroidism    Lower extremity edema 01/22/2018   Mitral regurgitation 05/03/2014   Mild-to-moderate mitral regurgitation, echo, November, 2015    Osteoarthritis of left knee 10/25/2017   Paroxysmal atrial fibrillation (HCC)    Episodes rapid atrial fib noted by pacemaker interrogation, August, 2011, diltiazem added, patient improved. Patient continues on Rythmol  //   Changed to flecainide 2013  //   flecainide level checked in 2013, good level    Persistent atrial fibrillation (HCC)    Past Surgical History:  Procedure Laterality Date   ABDOMINAL HYSTERECTOMY     EP IMPLANTABLE DEVICE N/A 08/08/2015   Generator change with [employer] DR PPM by Dr Johney Frame   JOINT REPLACEMENT     PACEMAKER PLACEMENT  2009        reports that she quit smoking about 32 years ago. Her smoking use  included cigarettes. She has never used smokeless tobacco. She reports that she does not drink alcohol and does not use drugs. Social History   Socioeconomic History   Marital status: Widowed    Spouse name: Not on file   Number of children: Not on file   Years of education: Not on file   Highest education level: Not on file  Occupational History   Not on file  Tobacco Use   Smoking status: Former    Current  packs/day: 0.00    Types: Cigarettes    Quit date: 09/06/1990    Years since quitting: 32.9   Smokeless tobacco: Never  Vaping Use   Vaping status: Never Used  Substance and Sexual Activity   Alcohol use: No   Drug use: No   Sexual activity: Not on file  Other Topics Concern   Not on file  Social History Narrative   Not on file   Social Drivers of Health   Financial Resource Strain: Not on file  Food Insecurity: No Food Insecurity (02/15/2022)   Hunger Vital Sign    Worried About Running Out of Food in the Last Year: Never true    Ran Out of Food in the Last Year: Never true  Transportation Needs: No Transportation Needs (02/15/2022)   PRAPARE - Administrator, Civil Service (Medical): No    Lack of Transportation (Non-Medical): No  Physical Activity: Not on file  Stress: Not on file  Social Connections: Not on file  Intimate Partner Violence: Not At Risk (02/15/2022)   Humiliation, Afraid, Rape, and Kick questionnaire    Fear of Current or Ex-Partner: No    Emotionally Abused: No    Physically Abused: No    Sexually Abused: No    Functional Status Survey:    Family History  Problem Relation Age of Onset   Heart disease Father    Coronary artery disease Other    Heart disease Son     Health Maintenance  Topic Date Due   DTaP/Tdap/Td (2 - Td or Tdap) 11/20/2021   COVID-19 Vaccine (7 - 2024-25 season) 02/10/2023   Medicare Annual Wellness (AWV)  04/08/2024   Pneumonia Vaccine 69+ Years old  Completed   INFLUENZA VACCINE  Completed   DEXA SCAN  Completed   Zoster Vaccines- Shingrix  Completed   HPV VACCINES  Aged Out    Allergies  Allergen Reactions   Morphine And Codeine Other (See Comments)    Patient states that she went "crazy" with this   Penicillins Swelling and Rash        Lasix [Furosemide] Itching    Patient stated it made her itch all over and dry.   Aspirin     Pacemaker   Clindamycin/Lincomycin Other (See Comments)    Any meds with  mycin in the name Unknown reaction   Crestor [Rosuvastatin]     Other reaction(s): muscle pain   Diltiazem     Skin discoloration, lower extremity swelling   Erythromycin Other (See Comments)    Any meds with mycin in the name Unknown reaction   Lisinopril Other (See Comments)    REACTION: Cough   Metronidazole     Other reaction(s): mouth sores, tongue swelling   Morphine    Penicillins Cross Reactors Other (See Comments)    Any meds with mycin in the name Unknown reaction    Outpatient Encounter Medications as of 08/09/2023  Medication Sig   acetaminophen (TYLENOL) 325 MG tablet Take 650  mg by mouth every 6 (six) hours as needed.   acetaminophen (TYLENOL) 500 MG tablet Take 1,000 mg by mouth in the morning and at bedtime.   Apoaequorin (PREVAGEN) 10 MG CAPS Take 10 mg by mouth daily.   betamethasone, augmented, (DIPROLENE) 0.05 % gel Apply topically daily.  Apply to scalp topically every 12 hours as needed for psoriasis related to PSORIASIS, UNSPECIFIED (L40.9) apply to affected area of scalp   Calcium Carbonate-Vit D-Min (CALCIUM 600+D PLUS MINERALS) 600-400 MG-UNIT TABS Take 1 tablet by mouth daily.   Carboxymethylcell-Glycerin PF (REFRESH RELIEVA PF) 0.5-1 % SOLN Apply 1 drop to eye in the morning and at bedtime. for relieve discomfort   diclofenac Sodium (VOLTAREN) 1 % GEL Apply 1 g topically 2 (two) times daily as needed.   ELIQUIS 5 MG TABS tablet TAKE ONE TABLET BY MOUTH TWICE A DAY   flecainide (TAMBOCOR) 50 MG tablet Take 1 tablet (50 mg total) by mouth 2 (two) times daily.   furosemide (LASIX) 20 MG tablet TAKE ONE TABLET BY MOUTH EVERY DAY   hydrocortisone 2.5 % ointment as needed (rash). Apply to affected areas topically as needed for Rashy/itchy areas two times daily-MAY SELF ADMINISTER-per pt request   Ipratropium-Albuterol (COMBIVENT) 20-100 MCG/ACT AERS respimat Inhale 1 puff into the lungs every 6 (six) hours.   levothyroxine (SYNTHROID) 100 MCG tablet Take 1 tablet  (100 mcg total) by mouth every morning.   metoprolol succinate (TOPROL-XL) 25 MG 24 hr tablet Take 3 tablets (75 mg total) by mouth daily. (Patient not taking: Reported on 06/13/2023)   Multiple Vitamin (MULTIVITAMIN ADULT) TABS Take 1 tablet by mouth daily.   Multiple Vitamins-Minerals (OCUVITE PO) Take 1 capsule by mouth daily.   NAMENDA 10 MG tablet TAKE ONE TABLET BY MOUTH TWICE A DAY FOR COGNITIVE DECLINE   nystatin cream (MYCOSTATIN) Apply 1 Application topically every 12 (twelve) hours as needed for dry skin.   pantoprazole (PROTONIX) 40 MG tablet Take 40 mg by mouth daily.   potassium chloride (KLOR-CON M) 10 MEQ tablet Take 1 tablet (10 mEq total) by mouth daily.   sodium chloride (MURO 128) 5 % ophthalmic ointment Place 1 Application into both eyes at bedtime.   sodium chloride (MURO 128) 5 % ophthalmic solution Place 1 drop into both eyes in the morning and at bedtime. for decrease swelling Wait 1 hour between these and Refresh   No facility-administered encounter medications on file as of 08/09/2023.    Review of Systems  Constitutional:  Negative for chills and fever.  HENT:  Negative for sinus pressure and sore throat.   Eyes:  Positive for itching.  Respiratory:  Negative for cough, shortness of breath and wheezing.   Cardiovascular:  Negative for chest pain, palpitations and leg swelling.  Gastrointestinal:  Negative for abdominal distention, abdominal pain, blood in stool, constipation, diarrhea, nausea and vomiting.  Genitourinary:  Negative for dysuria, frequency and urgency.  Neurological:  Negative for dizziness.   Negative unless indicated in HPI.  There were no vitals filed for this visit. There is no height or weight on file to calculate BMI. BP Readings from Last 3 Encounters:  07/12/23 (!) 140/58  07/01/23 (!) 150/60  06/21/23 (!) 150/68   Wt Readings from Last 3 Encounters:  07/12/23 200 lb 9.6 oz (91 kg)  07/01/23 200 lb 9.6 oz (91 kg)  06/21/23 200 lb 9.6  oz (91 kg)   Physical Exam Constitutional:      Appearance: Normal appearance.  HENT:  Head: Normocephalic and atraumatic.  Eyes:     Comments: Rt eye - slight red, eye lid mildly swollen  No discharge noted EOMI  No nystagmus   Cardiovascular:     Rate and Rhythm: Normal rate and regular rhythm.  Pulmonary:     Effort: Pulmonary effort is normal. No respiratory distress.     Breath sounds: Normal breath sounds. No wheezing.  Abdominal:     General: Bowel sounds are normal. There is no distension.     Tenderness: There is no abdominal tenderness. There is no guarding or rebound.     Comments:    Musculoskeletal:        General: No swelling or tenderness.  Neurological:     Mental Status: She is alert. Mental status is at baseline.     Motor: No weakness.     Labs reviewed: Basic Metabolic Panel: Recent Labs    01/26/23 0000 03/25/23 0000 06/20/23 0000  NA 139 137 141  K 4.3 4.4 4.0  CL 103 101 105  CO2 24* 28* 21  BUN 13 16 14   CREATININE 0.9 0.9 0.8  CALCIUM 9.6 9.2 9.2   Liver Function Tests: Recent Labs    01/18/23 0000 01/26/23 0000 06/20/23 0000  AST 23 24 29   ALT 16 17 15   ALKPHOS 37 40 48  ALBUMIN 3.7 3.7 3.6   No results for input(s): "LIPASE", "AMYLASE" in the last 8760 hours. No results for input(s): "AMMONIA" in the last 8760 hours. CBC: Recent Labs    03/25/23 0000 05/24/23 0000 06/20/23 0000  WBC 10.8 7.6 4.6  NEUTROABS 7,700.00 4,355.00 2,019.00  HGB 14.0 13.5 13.3  HCT 43 41 41  PLT 162 136* 92*   Cardiac Enzymes: No results for input(s): "CKTOTAL", "CKMB", "CKMBINDEX", "TROPONINI" in the last 8760 hours. BNP: Invalid input(s): "POCBNP" No results found for: "HGBA1C" Lab Results  Component Value Date   TSH 4.38 01/26/2023   No results found for: "VITAMINB12" No results found for: "FOLATE" No results found for: "IRON", "TIBC", "FERRITIN"  Imaging and Procedures obtained prior to SNF admission: CUP PACEART REMOTE  DEVICE CHECK Result Date: 07/13/2022 Scheduled remote reviewed. Normal device function.  1 atrial arrhythmia detected, 22sec in duration Next remote 91 days. LA   Assessment and Plan      1. Bacterial conjunctivitis of right eye (Primary) Pt with rt eye redness and lid swelling  No blurring or double vision  Will start trimethoprim- polymyxin eye drops for 5 days  2. Persistent atrial fibrillation (HCC) No signs of bleeding Cont with eliquis, flecainide  3. Chronic congestive heart failure, unspecified heart failure type (HCC) Lungs clear  Cont with lasix, potassium supplements  4. Hypothyroidism, unspecified type Lab Results  Component Value Date   TSH 4.38 01/26/2023   Cont with synthyroid    30 min Total time spent for obtaining history,  performing a medically appropriate examination and evaluation, reviewing the tests,   documenting clinical information in the electronic or other health record,care coordination (not separately reported)

## 2023-08-12 ENCOUNTER — Encounter: Payer: Self-pay | Admitting: Sports Medicine

## 2023-08-16 NOTE — Progress Notes (Signed)
 Remote pacemaker transmission.

## 2023-08-20 ENCOUNTER — Encounter (HOSPITAL_COMMUNITY): Payer: Self-pay

## 2023-08-20 ENCOUNTER — Emergency Department (HOSPITAL_COMMUNITY)
Admission: EM | Admit: 2023-08-20 | Discharge: 2023-08-21 | Disposition: A | Discharge status: Home / Self Care | Attending: Emergency Medicine | Admitting: Emergency Medicine

## 2023-08-20 DIAGNOSIS — H02132 Senile ectropion of right lower eyelid: Secondary | ICD-10-CM | POA: Diagnosis not present

## 2023-08-20 DIAGNOSIS — F32A Depression, unspecified: Secondary | ICD-10-CM | POA: Diagnosis not present

## 2023-08-20 DIAGNOSIS — R079 Chest pain, unspecified: Secondary | ICD-10-CM | POA: Diagnosis not present

## 2023-08-20 DIAGNOSIS — I499 Cardiac arrhythmia, unspecified: Secondary | ICD-10-CM | POA: Diagnosis not present

## 2023-08-20 DIAGNOSIS — E039 Hypothyroidism, unspecified: Secondary | ICD-10-CM | POA: Insufficient documentation

## 2023-08-20 DIAGNOSIS — Z950 Presence of cardiac pacemaker: Secondary | ICD-10-CM | POA: Diagnosis not present

## 2023-08-20 DIAGNOSIS — I517 Cardiomegaly: Secondary | ICD-10-CM | POA: Diagnosis not present

## 2023-08-20 DIAGNOSIS — R0602 Shortness of breath: Secondary | ICD-10-CM | POA: Insufficient documentation

## 2023-08-20 DIAGNOSIS — I10 Essential (primary) hypertension: Secondary | ICD-10-CM | POA: Diagnosis not present

## 2023-08-20 DIAGNOSIS — H02421 Myogenic ptosis of right eyelid: Secondary | ICD-10-CM | POA: Diagnosis not present

## 2023-08-20 NOTE — ED Triage Notes (Signed)
 Pt BIB [facility] from [facility] d/t CP but she denies CP - stated it is "just hard to take/ did not feel right taking a deep breath sometimes".

## 2023-08-21 ENCOUNTER — Non-Acute Institutional Stay: Payer: Self-pay | Admitting: Adult Health

## 2023-08-21 ENCOUNTER — Encounter: Payer: Self-pay | Admitting: Adult Health

## 2023-08-21 ENCOUNTER — Emergency Department (HOSPITAL_COMMUNITY)

## 2023-08-21 DIAGNOSIS — I4819 Other persistent atrial fibrillation: Secondary | ICD-10-CM | POA: Diagnosis not present

## 2023-08-21 DIAGNOSIS — R079 Chest pain, unspecified: Secondary | ICD-10-CM | POA: Diagnosis not present

## 2023-08-21 DIAGNOSIS — F0390 Unspecified dementia without behavioral disturbance: Secondary | ICD-10-CM | POA: Diagnosis not present

## 2023-08-21 DIAGNOSIS — I10 Essential (primary) hypertension: Secondary | ICD-10-CM

## 2023-08-21 DIAGNOSIS — I517 Cardiomegaly: Secondary | ICD-10-CM | POA: Diagnosis not present

## 2023-08-21 DIAGNOSIS — Z950 Presence of cardiac pacemaker: Secondary | ICD-10-CM | POA: Diagnosis not present

## 2023-08-21 DIAGNOSIS — R0602 Shortness of breath: Secondary | ICD-10-CM | POA: Diagnosis not present

## 2023-08-21 LAB — BASIC METABOLIC PANEL
Anion gap: 13 (ref 5–15)
BUN: 16 mg/dL (ref 8–23)
CO2: 22 mmol/L (ref 22–32)
Calcium: 9.5 mg/dL (ref 8.9–10.3)
Chloride: 104 mmol/L (ref 98–111)
Creatinine, Ser: 1 mg/dL (ref 0.44–1.00)
GFR, Estimated: 53 mL/min — ABNORMAL LOW (ref >60)
Glucose, Bld: 103 mg/dL — ABNORMAL HIGH (ref 70–99)
Potassium: 4.2 mmol/L (ref 3.5–5.1)
Sodium: 139 mmol/L (ref 135–145)

## 2023-08-21 LAB — RESP PANEL BY RT-PCR (RSV, FLU A&B, COVID)  RVPGX2
Influenza A by PCR: NEGATIVE
Influenza B by PCR: NEGATIVE
Resp Syncytial Virus by PCR: NEGATIVE
SARS Coronavirus 2 by RT PCR: NEGATIVE

## 2023-08-21 LAB — URINALYSIS, ROUTINE W REFLEX MICROSCOPIC
Bilirubin Urine: NEGATIVE
Glucose, UA: NEGATIVE mg/dL
Hgb urine dipstick: NEGATIVE
Ketones, ur: NEGATIVE mg/dL
Nitrite: NEGATIVE
Protein, ur: NEGATIVE mg/dL
Specific Gravity, Urine: 1.004 — ABNORMAL LOW (ref 1.005–1.030)
WBC, UA: >50 WBC/hpf (ref 0–5)
pH: 6 (ref 5.0–8.0)

## 2023-08-21 LAB — CBC
HCT: 44.3 % (ref 36.0–46.0)
Hemoglobin: 14.6 g/dL (ref 12.0–15.0)
MCH: 32.7 pg (ref 26.0–34.0)
MCHC: 33 g/dL (ref 30.0–36.0)
MCV: 99.1 fL (ref 80.0–100.0)
Platelets: 115 10*3/uL — ABNORMAL LOW (ref 150–400)
RBC: 4.47 MIL/uL (ref 3.87–5.11)
RDW: 13.2 % (ref 11.5–15.5)
WBC: 5.6 10*3/uL (ref 4.0–10.5)
nRBC: 0 % (ref 0.0–0.2)

## 2023-08-21 LAB — TROPONIN I (HIGH SENSITIVITY)
Troponin I (High Sensitivity): 17 ng/L (ref <18)
Troponin I (High Sensitivity): 18 ng/L — ABNORMAL HIGH (ref <18)

## 2023-08-21 MED ORDER — ACETAMINOPHEN 500 MG PO TABS
1000.0000 mg | ORAL_TABLET | Freq: Once | ORAL | Status: AC
  Administered 2023-08-21: 1000 mg via ORAL
  Filled 2023-08-21: qty 2

## 2023-08-21 MED ORDER — AMLODIPINE BESYLATE 2.5 MG PO TABS: 2.5000 mg | ORAL_TABLET | Freq: Every day | ORAL | Status: DC

## 2023-08-21 MED ORDER — SODIUM CHLORIDE 0.9 % IV BOLUS
500.0000 mL | Freq: Once | INTRAVENOUS | Status: AC
  Administered 2023-08-21: 500 mL via INTRAVENOUS

## 2023-08-21 NOTE — ED Provider Notes (Signed)
 Emergency Department Provider Note   I have reviewed the triage vital signs and the nursing notes.   HISTORY  Chief Complaint Shortness of Breath   HPI [name] is a 89 y.o. female with PMH reviewed presents to the emergency department for evaluation of generally not feeling well.  She states she had a "smothered" feeling this evening which she describes as so shortness of breath.  She did not experience any chest discomfort, nausea, diaphoresis.  No syncope.  She had a very busy day with multiple errands and was more active than normal.  No recent medication changes that she is aware of.  Denies any vomiting or diarrhea.  She is feeling somewhat better here now.    Past Medical History:  Diagnosis Date   Aortic stenosis 03/08/2016   Brady-tachy syndrome (HCC)    a. Biotronik dual chamber PPM implanted 2009 b. gen change to STJ dual chamber PPM 2017   Ejection fraction    EF 70%, echo, October, 2009  //   EF 55-60%, septal dyssynergy consistent with a paced rhythm, mild to moderate mitral regurgitation, echo, November, 2015    GERD (gastroesophageal reflux disease) 04/28/2014   Episodes November, 2015 with fluid refluxing from her esophagus.    Hair loss    Patient questioned Coumadin, changed toPradaxa   Hypertension    Hypothyroidism    Lower extremity edema 01/22/2018   Mitral regurgitation 05/03/2014   Mild-to-moderate mitral regurgitation, echo, November, 2015    Osteoarthritis of left knee 10/25/2017   Paroxysmal atrial fibrillation (HCC)    Episodes rapid atrial fib noted by pacemaker interrogation, August, 2011, diltiazem added, patient improved. Patient continues on Rythmol  //   Changed to flecainide 2013  //   flecainide level checked in 2013, good level    Persistent atrial fibrillation (HCC)     Review of Systems  Constitutional: No fever/chills Cardiovascular: Denies chest pain. Respiratory: Positive shortness of breath. Gastrointestinal: No abdominal  pain.  No nausea, no vomiting.   Genitourinary: Negative for dysuria. Musculoskeletal: Negative for back pain. Skin: Negative for rash. Neurological: Negative for headaches.   ____________________________________________   PHYSICAL EXAM:  VITAL SIGNS: ED Triage Vitals  Encounter Vitals Group     BP 08/20/23 2359 (!) 154/71     Pulse Rate 08/20/23 2359 72     Resp 08/20/23 2356 20     Temp 08/20/23 2356 (!) 97.5 F (36.4 C)     Temp Source 08/20/23 2356 Oral     SpO2 08/20/23 2351 95 %     Weight 08/20/23 2353 199 lb 9.6 oz (90.5 kg)     Height 08/20/23 2353 5\' 5"  (1.651 m)   Constitutional: Alert and oriented. Well appearing and in no acute distress. Eyes: Conjunctivae are normal.  Head: Atraumatic. Nose: No congestion/rhinnorhea. Mouth/Throat: Mucous membranes are moist.   Neck: No stridor.   Cardiovascular: Normal rate, regular rhythm. Good peripheral circulation. Grossly normal heart sounds.   Respiratory: Normal respiratory effort.  No retractions. Lungs CTAB. Gastrointestinal: Soft and nontender. No distention.  Musculoskeletal: No lower extremity tenderness nor edema. No gross deformities of extremities. Neurologic:  Normal speech and language. No gross focal neurologic deficits are appreciated.  Skin:  Skin is warm, dry and intact. No rash noted.   ____________________________________________   LABS (all labs ordered are listed, but only abnormal results are displayed)  Labs Reviewed  CBC - Abnormal; Notable for the following components:      Result Value  Platelets 115 (*)    All other components within normal limits  RESP PANEL BY RT-PCR (RSV, FLU A&B, COVID)  [id number]  BASIC METABOLIC PANEL  URINALYSIS, ROUTINE W REFLEX MICROSCOPIC  TROPONIN I (HIGH SENSITIVITY)   ____________________________________________  EKG   EKG Interpretation Date/Time:  Wednesday August 21 2023 00:29:32 EDT Ventricular Rate:  63 PR Interval:  256 QRS Duration:  114 QT  Interval:  462 QTC Calculation: 473 R Axis:   -5  Text Interpretation: Sinus rhythm Prolonged PR interval LVH with IVCD and secondary repol abnrm Similar to prior tracing Confirmed by Alona Bene [phone]) on 08/21/2023 12:34:21 AM        ____________________________________________  RADIOLOGY  No results found.  ____________________________________________   PROCEDURES  Procedure(s) performed:   Procedures   ____________________________________________   INITIAL IMPRESSION / ASSESSMENT AND PLAN / ED COURSE  Pertinent labs & imaging results that were available during my care of the patient were reviewed by me and considered in my medical decision making (see chart for details).   This patient is Presenting for Evaluation of SOB, which does require a range of treatment options, and is a complaint that involves a high risk of morbidity and mortality.  The Differential Diagnoses includes but is not exclusive to acute coronary syndrome, aortic dissection, pulmonary embolism, cardiac tamponade, community-acquired pneumonia, pericarditis, musculoskeletal chest wall pain, etc.   Critical Interventions-    Medications  sodium chloride 0.9 % bolus 500 mL (has no administration in time range)    Reassessment after intervention: symptoms improved.    I did obtain Additional Historical Information from family at bedside.  Clinical Laboratory Tests Ordered, included CBC without leukocytosis or anemia. ***  Radiologic Tests Ordered, included CXR. I independently interpreted the images and agree with radiology interpretation.   Cardiac Monitor Tracing which shows NSR.    Social Determinants of Health Risk patient is a non-smoker.   Consult complete with  Medical Decision Making: Summary:  Patient presents to the emergency department for evaluation of shortness of breath.  EKG similar to prior tracings.  Plan for screening blood work, UA, viral panel, chest x-Plancarte and  reassess after gentle IV fluid bolus.  Reevaluation with update and discussion with   ***Considered admission***  Patient's presentation is most consistent with acute presentation with potential threat to life or bodily function.   Disposition:   ____________________________________________  FINAL CLINICAL IMPRESSION(S) / ED DIAGNOSES  Final diagnoses:  None     NEW OUTPATIENT MEDICATIONS STARTED DURING THIS VISIT:  New Prescriptions   No medications on file    Note:  This document was prepared using Dragon voice recognition software and may include unintentional dictation errors.  Alona Bene, MD, [facility] Emergency Medicine

## 2023-08-21 NOTE — Progress Notes (Signed)
 Location:  [facility] Room Number: AL 824-A Place of Service:  ALF [phone]) Provider:  Kenard Gower, DNP, FNP-BC  Patient Care Team: Venita Sheffield, MD as PCP - General (Internal Medicine) [name], MD (Inactive) as PCP - Electrophysiology (Cardiology) [name], MD (Inactive) as PCP - Cardiology (Cardiology) Lurline Idol, FNP (Hospice and Palliative Medicine)  Extended Emergency Contact Information Primary Emergency Contact: [facility] Phone: [phone] Mobile Phone: [phone] Relation: Daughter Interpreter needed? No Secondary Emergency Contact: Pearman,Karen Address: [address]          [place], [place] [address] [place] Home Phone: [phone] Mobile Phone: [phone] Relation: Daughter  Code Status:   DNR  Goals of care: Advanced Directive information    08/21/2023   12:16 PM  Advanced Directives  Does Patient Have a Medical Advance Directive? Yes  Type of Estate agent of [place];Out of facility DNR (pink MOST or yellow form)  Does patient want to make changes to medical advance directive? No - Patient declined  Copy of Healthcare Power of Attorney in Chart? Yes - validated most recent copy scanned in chart (See row information)  Pre-existing out of facility DNR order (yellow form or pink MOST form) Yellow form placed in chart (order not valid for inpatient use)     Chief Complaint  Patient presents with   Acute Visit    Uncontrolled hypertension.    HPI:  Pt is a 89 y.o. female seen today for an acute visit regarding elevated BP. She is a resident of [facility]. She was sen to ED last night, 08/20/23 for shortness of breath and generally not feeling well.  CBC without leukocytosis or anemia.  Troponin negative.  COVID-negative.  No clear UTI.  No AKI.  Chest x-Bashor was negative for acute issues.  She was discharged back to [facility] with no new orders.  This morning, BP 119/78, rechecked BP 164/66.  She denies headache nor dizziness.  She is currently taking Toprol-XL 75 mg daily for A-fib. She, also, takes Eliquis 5 mg BID and flecainide 5 mg twice a day for PAF.  She takes Namenda 5 mg daily for dementia.  Latest MMSE 27/30, ranging as mild dementia.   Past Medical History:  Diagnosis Date   Aortic stenosis 03/08/2016   Brady-tachy syndrome (HCC)    a. Biotronik dual chamber PPM implanted 2009 b. gen change to STJ dual chamber PPM 2017   Ejection fraction    EF 70%, echo, October, 2009  //   EF 55-60%, septal dyssynergy consistent with a paced rhythm, mild to moderate mitral regurgitation, echo, November, 2015    GERD (gastroesophageal reflux disease) 04/28/2014   Episodes November, 2015 with fluid refluxing from her esophagus.    Hair loss    Patient questioned Coumadin, changed toPradaxa   Hypertension    Hypothyroidism    Lower extremity edema 01/22/2018   Mitral regurgitation 05/03/2014   Mild-to-moderate mitral regurgitation, echo, November, 2015    Osteoarthritis of left knee 10/25/2017   Paroxysmal atrial fibrillation (HCC)    Episodes rapid atrial fib noted by pacemaker interrogation, August, 2011, diltiazem added, patient improved. Patient continues on Rythmol  //   Changed to flecainide 2013  //   flecainide level checked in 2013, good level    Persistent atrial fibrillation (HCC)    Past Surgical History:  Procedure Laterality Date   ABDOMINAL HYSTERECTOMY     EP IMPLANTABLE  DEVICE N/A 08/08/2015   Generator change with [employer] DR PPM by Dr Johney Frame   JOINT REPLACEMENT     PACEMAKER PLACEMENT  2009        Allergies  Allergen Reactions   Morphine And Codeine Other (See Comments)    Patient states that she went "crazy" with this   Penicillins Swelling and Rash    Has patient had a PCN reaction causing immediate rash, facial/tongue/throat swelling, SOB or lightheadedness with  hypotension: No Has patient had a PCN reaction causing severe rash involving mucus membranes or skin necrosis: No Has patient had a PCN reaction that required hospitalization No Has patient had a PCN reaction occurring within the last 10 years: No If all of the above answers are "NO", then may proceed with Cephalosporin use.   Lasix [Furosemide] Itching    Patient stated it made her itch all over and dry.   Aspirin     Pacemaker   Clindamycin/Lincomycin Other (See Comments)    Any meds with mycin in the name Unknown reaction   Crestor [Rosuvastatin]     Other reaction(s): muscle pain   Diltiazem     Skin discoloration, lower extremity swelling   Erythromycin Other (See Comments)    Any meds with mycin in the name Unknown reaction   Lisinopril Other (See Comments)    REACTION: Cough   Metronidazole     Other reaction(s): mouth sores, tongue swelling   Morphine    Penicillins Cross Reactors Other (See Comments)    Any meds with mycin in the name Unknown reaction    Outpatient Encounter Medications as of 08/21/2023  Medication Sig   acetaminophen (TYLENOL) 500 MG tablet Take 1,000 mg by mouth in the morning and at bedtime.   amLODipine (NORVASC) 2.5 MG tablet Take 1 tablet (2.5 mg total) by mouth daily.   Apoaequorin (PREVAGEN) 10 MG CAPS Take 10 mg by mouth daily.   betamethasone, augmented, (DIPROLENE) 0.05 % gel Apply topically daily.  Apply to scalp topically every 12 hours as needed for psoriasis related to PSORIASIS, UNSPECIFIED (L40.9) apply to affected area of scalp   Calcium Carbonate-Vit D-Min (CALCIUM 600+D PLUS MINERALS) 600-400 MG-UNIT TABS Take 1 tablet by mouth daily.   Carboxymethylcell-Glycerin PF (REFRESH RELIEVA PF) 0.5-1 % SOLN Apply 1 drop to eye in the morning and at bedtime. for relieve discomfort   diclofenac Sodium (VOLTAREN) 1 % GEL Apply 1 g topically 2 (two) times daily as needed.   ELIQUIS 5 MG TABS tablet TAKE ONE TABLET BY MOUTH TWICE A DAY    flecainide (TAMBOCOR) 50 MG tablet Take 1 tablet (50 mg total) by mouth 2 (two) times daily.   furosemide (LASIX) 20 MG tablet TAKE ONE TABLET BY MOUTH EVERY DAY   hydrocortisone 2.5 % ointment as needed (rash). Apply to affected areas topically as needed for Rashy/itchy areas two times daily-MAY SELF ADMINISTER-per pt request   Ipratropium-Albuterol (COMBIVENT) 20-100 MCG/ACT AERS respimat Inhale 1 puff into the lungs every 6 (six) hours.   levothyroxine (SYNTHROID) 100 MCG tablet Take 1 tablet (100 mcg total) by mouth every morning.   Multiple Vitamin (MULTIVITAMIN ADULT) TABS Take 1 tablet by mouth daily.   Multiple Vitamins-Minerals (OCUVITE PO) Take 1 capsule by mouth daily.   NAMENDA 10 MG tablet TAKE ONE TABLET BY MOUTH TWICE A DAY FOR COGNITIVE DECLINE   nystatin cream (MYCOSTATIN) Apply 1 Application topically every 12 (twelve) hours as needed for dry skin.   pantoprazole (PROTONIX) 40 MG  tablet Take 40 mg by mouth daily.   potassium chloride (KLOR-CON M) 10 MEQ tablet Take 1 tablet (10 mEq total) by mouth daily.   sodium chloride (MURO 128) 5 % ophthalmic solution Place 1 drop into both eyes in the morning and at bedtime. for decrease swelling Wait 1 hour between these and Refresh   acetaminophen (TYLENOL) 325 MG tablet Take 650 mg by mouth every 6 (six) hours as needed. (Patient not taking: Reported on 08/21/2023)   metoprolol succinate (TOPROL-XL) 25 MG 24 hr tablet Take 3 tablets (75 mg total) by mouth daily. (Patient not taking: Reported on 06/13/2023)   sodium chloride (MURO 128) 5 % ophthalmic ointment Place 1 Application into both eyes at bedtime. (Patient not taking: Reported on 08/21/2023)   No facility-administered encounter medications on file as of 08/21/2023.    Review of Systems  Constitutional:  Negative for appetite change, chills, fatigue and fever.  HENT:  Negative for congestion, hearing loss, rhinorrhea and sore throat.   Eyes: Negative.   Respiratory:  Negative for  cough, shortness of breath and wheezing.   Cardiovascular:  Positive for chest pain. Negative for palpitations and leg swelling.  Gastrointestinal:  Negative for abdominal pain, constipation, diarrhea, nausea and vomiting.  Genitourinary:  Negative for dysuria.  Musculoskeletal:  Negative for arthralgias, back pain and myalgias.  Skin:  Negative for color change, rash and wound.  Neurological:  Negative for dizziness, weakness and headaches.  Psychiatric/Behavioral:  Negative for behavioral problems. The patient is not nervous/anxious.        Immunization History  Administered Date(s) Administered   Fluad Quad(high Dose 65+) 04/02/2022   Influenza Split 03/11/2009, 03/11/2010, 02/25/2011, 02/24/2019, 03/08/2020, 03/25/2021   Influenza, High Dose Seasonal PF 03/08/2015, 04/03/2017, 03/17/2018, 03/08/2020, 04/15/2023   Influenza,inj,Quad PF,6+ Mos 03/08/2016   Influenza,inj,quad, With Preservative 03/11/2017   Moderna Covid-19 Vaccine Bivalent Booster 69yrs & up 10/27/2021, 07/11/2023   Moderna SARS-COV2 Booster Vaccination 11/08/2020   Moderna Sars-Covid-2 Vaccination 06/15/2019, 07/13/2019, 04/19/2020, 02/27/2022   Pfizer Covid-19 Vaccine Bivalent Booster 29yrs & up 02/28/2021, 04/12/2022   Pneumococcal Conjugate-13 12/06/2014   Pneumococcal Polysaccharide-23 05/13/2001, 03/11/2009   Polio, Unspecified 02/02/1958   Tdap 11/21/2011, 04/11/2023   Zoster Recombinant(Shingrix) 07/18/2022, 10/19/2022   Pertinent  Health Maintenance Due  Topic Date Due   INFLUENZA VACCINE  Completed   DEXA SCAN  Completed      02/16/2022    9:45 AM 02/27/2022   11:41 AM 07/06/2022   10:04 AM 07/13/2022    3:55 PM 03/25/2023    4:51 PM  Fall Risk  Falls in the past year?   0 0 1  Was there an injury with Fall?   0 0 0  Fall Risk Category Calculator   0 0 1  (RETIRED) Patient Fall Risk Level High fall risk High fall risk     Patient at Risk for Falls Due to  History of fall(s) No Fall Risks No Fall  Risks History of fall(s);Impaired balance/gait;Impaired mobility  Fall risk Follow up  Falls evaluation completed  Falls evaluation completed Falls evaluation completed;Falls prevention discussed;Education provided     Vitals:   08/21/23 1215  BP: 132/60  Pulse: 63  Resp: 20  Temp: (!) 97.4 F (36.3 C)  SpO2: 95%  Weight: 198 lb 3.2 oz (89.9 kg)  Height: 5\' 5"  (1.651 m)   Body mass index is 32.98 kg/m.  Physical Exam Constitutional:      Appearance: She is obese.  HENT:  Head: Normocephalic and atraumatic.     Nose: Nose normal.     Mouth/Throat:     Mouth: Mucous membranes are moist.  Eyes:     Conjunctiva/sclera: Conjunctivae normal.  Cardiovascular:     Rate and Rhythm: Normal rate and regular rhythm.  Pulmonary:     Effort: Pulmonary effort is normal.     Breath sounds: Normal breath sounds.  Abdominal:     General: Bowel sounds are normal.     Palpations: Abdomen is soft.  Musculoskeletal:        General: Normal range of motion.     Cervical back: Normal range of motion.  Skin:    General: Skin is warm and dry.  Neurological:     Mental Status: She is alert.  Psychiatric:        Mood and Affect: Mood normal.        Behavior: Behavior normal.        Thought Content: Thought content normal.        Judgment: Judgment normal.      Labs reviewed:  Recent Labs    03/25/23 0000 06/20/23 0000 08/21/23 0010  NA 137 141 139  K 4.4 4.0 4.2  CL 101 105 104  CO2 28 21 22   GLUCOSE  --   --  103  BUN 16 14 16   CREATININE 0.9 0.8 1.00  CALCIUM 9.2 9.2 9.5    Recent Labs    01/18/23 0000 01/26/23 0000 06/20/23 0000  AST 23 24 29   ALT 16 17 15   ALKPHOS 37 40 48  ALBUMIN 3.7 3.7 3.6   Recent Labs    03/25/23 0000 05/24/23 0000 06/20/23 0000 08/21/23 0010  WBC 10.8 7.6 4.6 5.6  NEUTROABS 7,700.00 4,355.00 2,019.00  --   HGB 14.0 13.5 13.3 14.6  HCT 43 41 41 44.3  MCV  --   --   --  99.1  PLT 162 136* 92* 115*   Lab Results  Component  Value Date   TSH 4.38 01/26/2023   No results found for: "HGBA1C" Lab Results  Component Value Date   CHOL 193 05/13/2017   HDL 41 05/13/2017   LDLCALC 122 (H) 05/13/2017   TRIG 152 (H) 05/13/2017   CHOLHDL 4.7 (H) 05/13/2017    Significant Diagnostic Results in last 30 days:  DG Chest 2 View Result Date: 08/21/2023 CLINICAL DATA:  Chest pain and shortness of breath EXAM: CHEST - 2 VIEW COMPARISON:  Chest x-Strieter 02/12/2022 FINDINGS: Left-sided pacemaker is again seen. The heart is enlarged. There is no focal lung infiltrate, pleural effusion or pneumothorax. No acute fractures are seen. There is chronic compression deformity of a midthoracic vertebral body. IMPRESSION: Cardiomegaly. No acute cardiopulmonary process. Electronically Signed   By: Darliss Cheney M.D.   On: 08/21/2023 01:55    Assessment/Plan  1. Uncontrolled hypertension (Primary) -  BP elevated -Will start on amlodipine 2.5 mg daily and continue metoprolol succinate 75 mg daily -BP daily x 2 weeks - amLODipine (NORVASC) 2.5 MG tablet; Take 1 tablet (2.5 mg total) by mouth daily.  2. Persistent atrial fibrillation (HCC) -   Rate controlled -   Continue metoprolol succinate 75 mg daily, Eliquis 5 mg twice a day and flecainide 5 mg twice a day  3. Senile dementia (HCC) -MMSE 27/30, ranging as   mild cognitive impairment -   Advice worker Communication: Discussed plan of care with resident and charge nurse  Labs/tests ordered: None  Kenard Gower, DNP, MSN, FNP-BC [facility] [phone] (Monday-Friday 8:00 a.m. - 5:00 p.m.) [phone] (after hours)

## 2023-08-21 NOTE — Discharge Instructions (Signed)
 Your lab work and x-Matherly look normal. Please continue your home medications and follow with your primary care doctor in the coming week.

## 2023-08-27 DIAGNOSIS — H02132 Senile ectropion of right lower eyelid: Secondary | ICD-10-CM | POA: Diagnosis not present

## 2023-08-27 DIAGNOSIS — H18513 Endothelial corneal dystrophy, bilateral: Secondary | ICD-10-CM | POA: Diagnosis not present

## 2023-09-03 DIAGNOSIS — H02132 Senile ectropion of right lower eyelid: Secondary | ICD-10-CM | POA: Diagnosis not present

## 2023-09-03 DIAGNOSIS — H18513 Endothelial corneal dystrophy, bilateral: Secondary | ICD-10-CM | POA: Diagnosis not present

## 2023-09-05 DIAGNOSIS — M25512 Pain in left shoulder: Secondary | ICD-10-CM | POA: Diagnosis not present

## 2023-09-09 ENCOUNTER — Other Ambulatory Visit: Payer: Self-pay | Admitting: Physician Assistant

## 2023-09-09 DIAGNOSIS — I350 Nonrheumatic aortic (valve) stenosis: Secondary | ICD-10-CM

## 2023-09-09 DIAGNOSIS — I5032 Chronic diastolic (congestive) heart failure: Secondary | ICD-10-CM

## 2023-09-09 DIAGNOSIS — I480 Paroxysmal atrial fibrillation: Secondary | ICD-10-CM

## 2023-09-09 DIAGNOSIS — I10 Essential (primary) hypertension: Secondary | ICD-10-CM

## 2023-09-09 DIAGNOSIS — I340 Nonrheumatic mitral (valve) insufficiency: Secondary | ICD-10-CM

## 2023-09-10 NOTE — Progress Notes (Unsigned)
 Cardiology Clinic Note   Patient Name: [name] Date of Encounter: 09/13/2023  Primary Care Provider:  Venita Sheffield, MD Primary Cardiologist:  [name], MD (Inactive)  Patient Profile    [name] 89 year old female presents to the clinic today for follow-up evaluation of her shortness of breath and elevated blood pressure.  Past Medical History    Past Medical History:  Diagnosis Date   Aortic stenosis 03/08/2016   Brady-tachy syndrome (HCC)    a. Biotronik dual chamber PPM implanted 2009 b. gen change to STJ dual chamber PPM 2017   Ejection fraction    EF 70%, echo, October, 2009  //   EF 55-60%, septal dyssynergy consistent with a paced rhythm, mild to moderate mitral regurgitation, echo, November, 2015    GERD (gastroesophageal reflux disease) 04/28/2014   Episodes November, 2015 with fluid refluxing from her esophagus.    Hair loss    Patient questioned Coumadin, changed toPradaxa   Hypertension    Hypothyroidism    Lower extremity edema 01/22/2018   Mitral regurgitation 05/03/2014   Mild-to-moderate mitral regurgitation, echo, November, 2015    Osteoarthritis of left knee 10/25/2017   Paroxysmal atrial fibrillation (HCC)    Episodes rapid atrial fib noted by pacemaker interrogation, August, 2011, diltiazem added, patient improved. Patient continues on Rythmol  //   Changed to flecainide 2013  //   flecainide level checked in 2013, good level    Persistent atrial fibrillation (HCC)    Past Surgical History:  Procedure Laterality Date   ABDOMINAL HYSTERECTOMY     EP IMPLANTABLE DEVICE N/A 08/08/2015   Generator change with [employer] DR PPM by Dr Johney Frame   JOINT REPLACEMENT     PACEMAKER PLACEMENT  2009            History of Present Illness    [name] has a PMH of aortic valve stenosis, symptomatic bradycardia, PPM, paroxysmal atrial fibrillation, sick sinus syndrome, HTN, HLD and was previously evaluated by the TAVR team  (monitoring was recommended).  She was seen by Dr. Johney Frame 5/23.  She was noticing more atrial fibrillation.  This was anxiety provoking however she was not increasingly symptomatic.  Her burden was 1% and she was maintained on flecainide.  She followed up with Otilio Saber 03/09/2022.  She had been hospitalized prior to visit for sepsis/cellulitis and UTI.  She was feeling better.  She continues to follow-up regularly with general cardiology.  She was seen 11/24.  She was living in ALF, walking regularly and doing well.  Her lower extremity edema was intermittent.  Serial echocardiograms were planned for monitoring her AAS.  She was transitioned to Dr.Thukkani.  She was seen in follow-up by Francis Dowse PA-C 05/15/2023.  Her device was interrogated.  Battery life and lead measurements were good.  Less than 1% A-fib burden, 2 episodes both less than a minute and no heart rate variability episodes.  CHA2DS2-VASc score 4.  She was continued on apixaban, flecainide, metoprolol.  She was seen in the emergency department 08/21/2023.  She reported shortness of breath.  She indicated that she felt like she was being smothered.  She has been active through the day running errands.  She began not feeling well and presented to the emergency department.  She denied recent medication changes.  She denied vomiting or diarrhea.  She reported feeling somewhat better during physical exam.  Her blood pressure was noted to be 154/71.  EKG showed sinus rhythm,  prolonged PR interval 63 bpm.  Similar to previous EKG.  Workup was reassuring.  No symptoms for UTI.  Respiratory virus panel negative.  Chest x-[place] showed no acute changes.  She received half a liter of IV fluids.  Troponins were low and flat.  She was instructed to follow-up with her PCP.  She presents to the clinic today for follow-up evaluation and states she has not had any further episodes of shortness of breath.  We reviewed her emergency department visit.  She is  without complaint today.  Her blood pressure initially today is 144/70 and on recheck it is 136/68.  I will continue her current medication therapy.  We reviewed her upcoming echocardiogram.  Will plan follow-up in 6 months.  Today she denies chest pain, shortness of breath, lower extremity edema, fatigue, palpitations, melena, hematuria, hemoptysis, diaphoresis, weakness, presyncope, syncope, orthopnea, and PND.     Home Medications    Prior to Admission medications   Medication Sig Start Date End Date Taking? Authorizing Provider  acetaminophen (TYLENOL) 325 MG tablet Take 650 mg by mouth every 6 (six) hours as needed. Patient not taking: Reported on 08/21/2023    [provider]  acetaminophen (TYLENOL) 500 MG tablet Take 1,000 mg by mouth in the morning and at bedtime.    [provider]  amLODipine (NORVASC) 2.5 MG tablet Take 1 tablet (2.5 mg total) by mouth daily. 08/21/23   Medina-Vargas, Monina C, NP  Apoaequorin (PREVAGEN) 10 MG CAPS Take 10 mg by mouth daily. 11/13/22   [name], MD  betamethasone, augmented, (DIPROLENE) 0.05 % gel Apply topically daily.  Apply to scalp topically every 12 hours as needed for psoriasis related to PSORIASIS, UNSPECIFIED (L40.9) apply to affected area of scalp 11/13/22   [name], MD  Calcium Carbonate-Vit D-Min (CALCIUM 600+D PLUS MINERALS) 600-400 MG-UNIT TABS Take 1 tablet by mouth daily. 11/13/22   [name], MD  Carboxymethylcell-Glycerin PF (REFRESH RELIEVA PF) 0.5-1 % SOLN Apply 1 drop to eye in the morning and at bedtime. for relieve discomfort 11/13/22   [name], MD  diclofenac Sodium (VOLTAREN) 1 % GEL Apply 1 g topically 2 (two) times daily as needed.    [provider]  ELIQUIS 5 MG TABS tablet TAKE ONE TABLET BY MOUTH TWICE A DAY 03/18/23   Venita Sheffield, MD  flecainide (TAMBOCOR) 50 MG tablet Take 1 tablet (50 mg total) by mouth 2 (two) times daily. 11/13/22   [name], MD   furosemide (LASIX) 20 MG tablet TAKE ONE TABLET BY MOUTH EVERY DAY 03/19/23   Venita Sheffield, MD  hydrocortisone 2.5 % ointment as needed (rash). Apply to affected areas topically as needed for Rashy/itchy areas two times daily-MAY SELF ADMINISTER-per pt request    [provider]  Ipratropium-Albuterol (COMBIVENT) 20-100 MCG/ACT AERS respimat Inhale 1 puff into the lungs every 6 (six) hours.    [provider]  levothyroxine (SYNTHROID) 100 MCG tablet Take 1 tablet (100 mcg total) by mouth every morning. 11/13/22   [name], MD  metoprolol succinate (TOPROL-XL) 25 MG 24 hr tablet Take 3 tablets (75 mg total) by mouth daily. Patient not taking: Reported on 06/13/2023 11/13/22   [name], MD  Multiple Vitamin (MULTIVITAMIN ADULT) TABS Take 1 tablet by mouth daily.    [provider]  Multiple Vitamins-Minerals (OCUVITE PO) Take 1 capsule by mouth daily.    [provider]  NAMENDA 10 MG tablet TAKE ONE  TABLET BY MOUTH TWICE A DAY FOR COGNITIVE DECLINE 04/23/23   Venita Sheffield, MD  nystatin cream (MYCOSTATIN) Apply 1 Application topically every 12 (twelve) hours as needed for dry skin.    [provider]  pantoprazole (PROTONIX) 40 MG tablet Take 40 mg by mouth daily.    [provider]  potassium chloride (KLOR-CON M) 10 MEQ tablet Take 1 tablet (10 mEq total) by mouth daily. 06/28/23   Venita Sheffield, MD  sodium chloride (MURO 128) 5 % ophthalmic ointment Place 1 Application into both eyes at bedtime. Patient not taking: Reported on 08/21/2023    [provider]  sodium chloride (MURO 128) 5 % ophthalmic solution Place 1 drop into both eyes in the morning and at bedtime. for decrease swelling Wait 1 hour between these and Refresh 07/10/22   [name], MD    Family History    Family History  Problem Relation Age of Onset   Heart disease Father    Coronary artery disease Other    Heart disease  Son    She indicated that her mother is deceased. She indicated that her father is deceased. She indicated that her brother is alive. She indicated that her maternal grandmother is deceased. She indicated that her maternal grandfather is deceased. She indicated that her paternal grandmother is deceased. She indicated that her paternal grandfather is deceased. She indicated that her daughter is alive. She indicated that the status of her son is unknown. She indicated that the status of her other is unknown.  Social History    Social History   Socioeconomic History   Marital status: Widowed    Spouse name: Not on file   Number of children: Not on file   Years of education: Not on file   Highest education level: Not on file  Occupational History   Not on file  Tobacco Use   Smoking status: Former    Current packs/day: 0.00    Types: Cigarettes    Quit date: 09/06/1990    Years since quitting: 33.0   Smokeless tobacco: Never  Vaping Use   Vaping status: Never Used  Substance and Sexual Activity   Alcohol use: No   Drug use: No   Sexual activity: Not on file  Other Topics Concern   Not on file  Social History Narrative   Not on file   Social Drivers of Health   Financial Resource Strain: Not on file  Food Insecurity: No Food Insecurity (02/15/2022)   Hunger Vital Sign    Worried About Running Out of Food in the Last Year: Never true    Ran Out of Food in the Last Year: Never true  Transportation Needs: No Transportation Needs (02/15/2022)   PRAPARE - Administrator, Civil Service (Medical): No    Lack of Transportation (Non-Medical): No  Physical Activity: Not on file  Stress: Not on file  Social Connections: Not on file  Intimate Partner Violence: Not At Risk (02/15/2022)   Humiliation, Afraid, Rape, and Kick questionnaire    Fear of Current or Ex-Partner: No    Emotionally Abused: No    Physically Abused: No    Sexually Abused: No     Review of Systems     General:  No chills, fever, night sweats or weight changes.  Cardiovascular:  No chest pain, dyspnea on exertion, edema, orthopnea, palpitations, paroxysmal nocturnal dyspnea. Dermatological: No rash, lesions/masses Respiratory: No cough, dyspnea Urologic: No hematuria, dysuria Abdominal:  No nausea, vomiting, diarrhea, bright red blood per rectum, melena, or hematemesis Neurologic:  No visual changes, wkns, changes in mental status. All other systems reviewed and are otherwise negative except as noted above.  Physical Exam    VS:  BP 136/68   Pulse 62   Ht 5\' 5"  (1.651 m)   Wt 190 lb (86.2 kg)   SpO2 95%   BMI 31.62 kg/m  , BMI Body mass index is 31.62 kg/m. GEN: Well nourished, well developed, in no acute distress. HEENT: normal. Neck: Supple, no JVD, carotid bruits, or masses. Cardiac: RRR, no murmurs, rubs, or gallops. No clubbing, cyanosis, edema.  Radials/DP/PT 2+ and equal bilaterally.  Respiratory:  Respirations regular and unlabored, clear to auscultation bilaterally. GI: Soft, nontender, nondistended, BS + x 4. MS: no deformity or atrophy. Skin: warm and dry, no rash. Neuro:  Strength and sensation are intact. Psych: Normal affect.  Accessory Clinical Findings    Recent Labs: 01/26/2023: TSH 4.38 06/20/2023: ALT 15 08/21/2023: BUN 16; Creatinine, Ser 1.00; Hemoglobin 14.6; Platelets 115; Potassium 4.2; Sodium 139   Recent Lipid Panel    Component Value Date/Time   CHOL 193 05/13/2017 1224   TRIG 152 (H) 05/13/2017 1224   HDL 41 05/13/2017 1224   CHOLHDL 4.7 (H) 05/13/2017 1224   CHOLHDL 3.9 11/21/2011 1322   VLDL 25 11/21/2011 1322   LDLCALC 122 (H) 05/13/2017 1224         ECG personally reviewed by me today- none today.    Echocardiogram 09/17/2022  IMPRESSIONS     1. Left ventricular ejection fraction, by estimation, is 70 to 75%. The  left ventricle has hyperdynamic function. The left ventricle has no  regional wall motion abnormalities. There is  severe left ventricular  hypertrophy of the septal segment. Left  ventricular diastolic parameters are consistent with Grade I diastolic  dysfunction (impaired relaxation). The average left ventricular global  longitudinal strain is -15.1 %. The global longitudinal strain is normal.   2. Right ventricular systolic function is normal. The right ventricular  size is normal. There is normal pulmonary artery systolic pressure. The  estimated right ventricular systolic pressure is 34.4 mmHg.   3. The mitral valve is normal in structure. Mild mitral valve  regurgitation. No evidence of mitral stenosis. Severe mitral annular  calcification.   4. The aortic valve is tricuspid. There is severe calcifcation of the  aortic valve. There is severe thickening of the aortic valve. Aortic valve  regurgitation is not visualized. Moderate to severe aortic valve stenosis.  Aortic valve area, by VTI  measures 0.99 cm. Aortic valve mean gradient measures 30.0 mmHg. Aortic  valve Vmax measures 3.58 m/s.   5. The inferior vena cava is normal in size with greater than 50%  respiratory variability, suggesting right atrial pressure of 3 mmHg.   Comparison(s): Prior images reviewed side by side. Prior AV mean gradient  26 mmHg 2023.   FINDINGS   Left Ventricle: Left ventricular ejection fraction, by estimation, is 70  to 75%. The left ventricle has hyperdynamic function. The left ventricle  has no regional wall motion abnormalities. The average left ventricular  global longitudinal strain is -15.1   %. The global longitudinal strain is normal. The left ventricular  internal cavity size was normal in size. There is severe left ventricular  hypertrophy of the septal segment. Left ventricular diastolic parameters  are consistent with Grade I diastolic  dysfunction (impaired relaxation).   Right Ventricle: The right ventricular size  is normal. No increase in  right ventricular wall thickness. Right ventricular  systolic function is  normal. There is normal pulmonary artery systolic pressure. The tricuspid  regurgitant velocity is 2.80 m/s, and   with an assumed right atrial pressure of 3 mmHg, the estimated right  ventricular systolic pressure is 34.4 mmHg.   Left Atrium: Left atrial size was normal in size.   Right Atrium: Right atrial size was normal in size.   Pericardium: There is no evidence of pericardial effusion.   Mitral Valve: The mitral valve is normal in structure. Severe mitral  annular calcification. Mild mitral valve regurgitation. No evidence of  mitral valve stenosis.   Tricuspid Valve: The tricuspid valve is normal in structure. Tricuspid  valve regurgitation is mild . No evidence of tricuspid stenosis.   Aortic Valve: The aortic valve is tricuspid. There is severe calcifcation  of the aortic valve. There is severe thickening of the aortic valve.  Aortic valve regurgitation is not visualized. Moderate to severe aortic  stenosis is present. Aortic valve mean  gradient measures 30.0 mmHg. Aortic valve peak gradient measures 51.3  mmHg. Aortic valve area, by VTI measures 0.99 cm.   Pulmonic Valve: The pulmonic valve was normal in structure. Pulmonic valve  regurgitation is not visualized. No evidence of pulmonic stenosis.   Aorta: The aortic root is normal in size and structure.   Venous: The inferior vena cava is normal in size with greater than 50%  respiratory variability, suggesting right atrial pressure of 3 mmHg.   IAS/Shunts: No atrial level shunt detected by color flow Doppler.       Assessment & Plan   1.  Essential hypertension-BP today 136/68. Continue amlodipine, metoprolol Heart healthy low-sodium diet Increase physical activity as tolerated   Aortic valve stenosis-echocardiogram 09/17/2022 showed severe aortic calcification.  Moderate-severe aortic valve stenosis with a mean gradient of 30 mmHg. Repeat echocardiogram as scheduled Maintain  physical activity  Paroxysmal atrial fibrillation-heart rate today 62.  CHA2DS2-VASc score 4.  On Eliquis.  Denies bleeding issues. Continue Eliquis, metoprolol  Sick sinus syndrome, PPM-denies recent episodes of lightheadedness, presyncope or syncope Follows with EP  Shortness of breath-breathing has returned to baseline.  Seen and evaluated in the emergency department.  Workup unremarkable.  Disposition: Follow-up with Dr. Jacques Navy or me in 6 months   Thomasene Ripple. Armonie Staten NP-C     09/13/2023, 11:53 AM [facility] [address] Office [phone] Fax [phone]    I spent 15 minutes examining this patient, reviewing medications, and using patient centered shared decision making involving their cardiac care.   I spent  20 minutes reviewing past medical history,  medications, and prior cardiac tests.

## 2023-09-12 DIAGNOSIS — R29898 Other symptoms and signs involving the musculoskeletal system: Secondary | ICD-10-CM | POA: Diagnosis not present

## 2023-09-12 DIAGNOSIS — M6281 Muscle weakness (generalized): Secondary | ICD-10-CM | POA: Diagnosis not present

## 2023-09-13 ENCOUNTER — Inpatient Hospital Stay (HOSPITAL_COMMUNITY)
Admission: EM | Admit: 2023-09-13 | Discharge: 2023-09-15 | DRG: 281 | Disposition: A | Discharge status: Home Health | Attending: Internal Medicine | Admitting: Internal Medicine

## 2023-09-13 ENCOUNTER — Encounter: Payer: Self-pay | Admitting: General Practice

## 2023-09-13 ENCOUNTER — Ambulatory Visit: Admitting: General Practice

## 2023-09-13 ENCOUNTER — Other Ambulatory Visit: Payer: Self-pay | Admitting: Family Medicine

## 2023-09-13 ENCOUNTER — Emergency Department (HOSPITAL_COMMUNITY)

## 2023-09-13 VITALS — BP 136/68 | HR 62 | Ht 65.0 in | Wt 190.0 lb

## 2023-09-13 DIAGNOSIS — I4892 Unspecified atrial flutter: Secondary | ICD-10-CM | POA: Diagnosis present

## 2023-09-13 DIAGNOSIS — I080 Rheumatic disorders of both mitral and aortic valves: Secondary | ICD-10-CM | POA: Diagnosis present

## 2023-09-13 DIAGNOSIS — Z8616 Personal history of COVID-19: Secondary | ICD-10-CM

## 2023-09-13 DIAGNOSIS — E669 Obesity, unspecified: Secondary | ICD-10-CM | POA: Diagnosis present

## 2023-09-13 DIAGNOSIS — I10 Essential (primary) hypertension: Secondary | ICD-10-CM

## 2023-09-13 DIAGNOSIS — I110 Hypertensive heart disease with heart failure: Secondary | ICD-10-CM | POA: Diagnosis present

## 2023-09-13 DIAGNOSIS — R0602 Shortness of breath: Secondary | ICD-10-CM

## 2023-09-13 DIAGNOSIS — I350 Nonrheumatic aortic (valve) stenosis: Secondary | ICD-10-CM | POA: Diagnosis not present

## 2023-09-13 DIAGNOSIS — I480 Paroxysmal atrial fibrillation: Secondary | ICD-10-CM

## 2023-09-13 DIAGNOSIS — Z6833 Body mass index (BMI) 33.0-33.9, adult: Secondary | ICD-10-CM | POA: Diagnosis not present

## 2023-09-13 DIAGNOSIS — I214 Non-ST elevation (NSTEMI) myocardial infarction: Principal | ICD-10-CM | POA: Diagnosis present

## 2023-09-13 DIAGNOSIS — M1712 Unilateral primary osteoarthritis, left knee: Secondary | ICD-10-CM | POA: Diagnosis present

## 2023-09-13 DIAGNOSIS — Z1611 Resistance to penicillins: Secondary | ICD-10-CM | POA: Diagnosis present

## 2023-09-13 DIAGNOSIS — I495 Sick sinus syndrome: Secondary | ICD-10-CM | POA: Diagnosis present

## 2023-09-13 DIAGNOSIS — I5032 Chronic diastolic (congestive) heart failure: Secondary | ICD-10-CM | POA: Diagnosis not present

## 2023-09-13 DIAGNOSIS — Z950 Presence of cardiac pacemaker: Secondary | ICD-10-CM | POA: Diagnosis not present

## 2023-09-13 DIAGNOSIS — Z79899 Other long term (current) drug therapy: Secondary | ICD-10-CM | POA: Diagnosis not present

## 2023-09-13 DIAGNOSIS — R079 Chest pain, unspecified: Principal | ICD-10-CM | POA: Diagnosis present

## 2023-09-13 DIAGNOSIS — Z95811 Presence of heart assist device: Secondary | ICD-10-CM | POA: Diagnosis not present

## 2023-09-13 DIAGNOSIS — I4891 Unspecified atrial fibrillation: Secondary | ICD-10-CM | POA: Diagnosis not present

## 2023-09-13 DIAGNOSIS — K219 Gastro-esophageal reflux disease without esophagitis: Secondary | ICD-10-CM | POA: Diagnosis present

## 2023-09-13 DIAGNOSIS — R4182 Altered mental status, unspecified: Secondary | ICD-10-CM | POA: Diagnosis not present

## 2023-09-13 DIAGNOSIS — Z7901 Long term (current) use of anticoagulants: Secondary | ICD-10-CM

## 2023-09-13 DIAGNOSIS — B9620 Unspecified Escherichia coli [E. coli] as the cause of diseases classified elsewhere: Secondary | ICD-10-CM | POA: Diagnosis present

## 2023-09-13 DIAGNOSIS — I4819 Other persistent atrial fibrillation: Secondary | ICD-10-CM | POA: Diagnosis present

## 2023-09-13 DIAGNOSIS — I6523 Occlusion and stenosis of bilateral carotid arteries: Secondary | ICD-10-CM | POA: Diagnosis not present

## 2023-09-13 DIAGNOSIS — E039 Hypothyroidism, unspecified: Secondary | ICD-10-CM | POA: Diagnosis present

## 2023-09-13 DIAGNOSIS — R000 Tachycardia, unspecified: Secondary | ICD-10-CM | POA: Diagnosis not present

## 2023-09-13 DIAGNOSIS — R918 Other nonspecific abnormal finding of lung field: Secondary | ICD-10-CM | POA: Diagnosis not present

## 2023-09-13 DIAGNOSIS — Z79890 Hormone replacement therapy (postmenopausal): Secondary | ICD-10-CM

## 2023-09-13 DIAGNOSIS — Z87891 Personal history of nicotine dependence: Secondary | ICD-10-CM

## 2023-09-13 DIAGNOSIS — F0394 Unspecified dementia, unspecified severity, with anxiety: Secondary | ICD-10-CM | POA: Diagnosis present

## 2023-09-13 DIAGNOSIS — J40 Bronchitis, not specified as acute or chronic: Secondary | ICD-10-CM | POA: Diagnosis not present

## 2023-09-13 DIAGNOSIS — R519 Headache, unspecified: Secondary | ICD-10-CM | POA: Diagnosis not present

## 2023-09-13 DIAGNOSIS — Z66 Do not resuscitate: Secondary | ICD-10-CM | POA: Diagnosis present

## 2023-09-13 DIAGNOSIS — N390 Urinary tract infection, site not specified: Secondary | ICD-10-CM | POA: Diagnosis present

## 2023-09-13 DIAGNOSIS — I517 Cardiomegaly: Secondary | ICD-10-CM | POA: Diagnosis not present

## 2023-09-13 DIAGNOSIS — R0789 Other chest pain: Secondary | ICD-10-CM | POA: Diagnosis not present

## 2023-09-13 DIAGNOSIS — R531 Weakness: Secondary | ICD-10-CM | POA: Diagnosis not present

## 2023-09-13 DIAGNOSIS — Z8249 Family history of ischemic heart disease and other diseases of the circulatory system: Secondary | ICD-10-CM

## 2023-09-13 LAB — COMPREHENSIVE METABOLIC PANEL WITH GFR
ALT: 38 U/L (ref 0–44)
AST: 63 U/L — ABNORMAL HIGH (ref 15–41)
Albumin: 3.3 g/dL — ABNORMAL LOW (ref 3.5–5.0)
Alkaline Phosphatase: 46 U/L (ref 38–126)
Anion gap: 12 (ref 5–15)
BUN: 25 mg/dL — ABNORMAL HIGH (ref 8–23)
CO2: 24 mmol/L (ref 22–32)
Calcium: 9.2 mg/dL (ref 8.9–10.3)
Chloride: 105 mmol/L (ref 98–111)
Creatinine, Ser: 0.88 mg/dL (ref 0.44–1.00)
GFR, Estimated: >60 mL/min (ref >60)
Glucose, Bld: 118 mg/dL — ABNORMAL HIGH (ref 70–99)
Potassium: 4 mmol/L (ref 3.5–5.1)
Sodium: 141 mmol/L (ref 135–145)
Total Bilirubin: 0.7 mg/dL (ref 0.0–1.2)
Total Protein: 6.6 g/dL (ref 6.5–8.1)

## 2023-09-13 LAB — RESP PANEL BY RT-PCR (RSV, FLU A&B, COVID)  RVPGX2
Influenza A by PCR: NEGATIVE
Influenza B by PCR: NEGATIVE
Resp Syncytial Virus by PCR: NEGATIVE
SARS Coronavirus 2 by RT PCR: NEGATIVE

## 2023-09-13 LAB — CBC WITH DIFFERENTIAL/PLATELET
Abs Immature Granulocytes: 0.03 10*3/uL (ref 0.00–0.07)
Basophils Absolute: 0.1 10*3/uL (ref 0.0–0.1)
Basophils Relative: 1 %
Eosinophils Absolute: 0.4 10*3/uL (ref 0.0–0.5)
Eosinophils Relative: 5 %
HCT: 44.8 % (ref 36.0–46.0)
Hemoglobin: 14.9 g/dL (ref 12.0–15.0)
Immature Granulocytes: 0 %
Lymphocytes Relative: 21 %
Lymphs Abs: 1.7 10*3/uL (ref 0.7–4.0)
MCH: 32.7 pg (ref 26.0–34.0)
MCHC: 33.3 g/dL (ref 30.0–36.0)
MCV: 98.5 fL (ref 80.0–100.0)
Monocytes Absolute: 1.1 10*3/uL — ABNORMAL HIGH (ref 0.1–1.0)
Monocytes Relative: 13 %
Neutro Abs: 5.1 10*3/uL (ref 1.7–7.7)
Neutrophils Relative %: 60 %
Platelets: 154 10*3/uL (ref 150–400)
RBC: 4.55 MIL/uL (ref 3.87–5.11)
RDW: 13.2 % (ref 11.5–15.5)
WBC: 8.3 10*3/uL (ref 4.0–10.5)
nRBC: 0 % (ref 0.0–0.2)

## 2023-09-13 LAB — BRAIN NATRIURETIC PEPTIDE: B Natriuretic Peptide: 376.4 pg/mL — ABNORMAL HIGH (ref 0.0–100.0)

## 2023-09-13 LAB — TROPONIN I (HIGH SENSITIVITY): Troponin I (High Sensitivity): 82 ng/L — ABNORMAL HIGH (ref <18)

## 2023-09-13 MED ORDER — SODIUM CHLORIDE 0.9 % IV BOLUS
500.0000 mL | Freq: Once | INTRAVENOUS | Status: AC
  Administered 2023-09-13: 500 mL via INTRAVENOUS

## 2023-09-13 NOTE — ED Provider Notes (Signed)
 [facility] AT [facility] Provider Note  CSN: [id number] Arrival date & time: 09/13/23 1944  Chief Complaint(s) Chest Pain and Weakness  HPI [name] is a 89 y.o. female history of tachybradycardia syndrome status post pacemaker, hypertension, hypothyroidism, paroxysmal A-fib, dementia, CHF presenting to the emergency department with apparent episode of chest pain.  Per EMS, patient was complaining of chest pain just after dinner around 6 PM.  Has also been having weakness for the past 2 days.  Patient denies any ongoing chest pain.  She reports that she also had a headache earlier but this is resolved.  No fevers or chills.  No abdominal pain.  She reports mild shortness of breath which is improved.  No leg swelling.  No nausea or vomiting.  Denies other new symptoms.   Past Medical History Past Medical History:  Diagnosis Date   Aortic stenosis 03/08/2016   Brady-tachy syndrome (HCC)    a. Biotronik dual chamber PPM implanted 2009 b. gen change to STJ dual chamber PPM 2017   Ejection fraction    EF 70%, echo, October, 2009  //   EF 55-60%, septal dyssynergy consistent with a paced rhythm, mild to moderate mitral regurgitation, echo, November, 2015    GERD (gastroesophageal reflux disease) 04/28/2014   Episodes November, 2015 with fluid refluxing from her esophagus.    Hair loss    Patient questioned Coumadin, changed toPradaxa   Hypertension    Hypothyroidism    Lower extremity edema 01/22/2018   Mitral regurgitation 05/03/2014   Mild-to-moderate mitral regurgitation, echo, November, 2015    Osteoarthritis of left knee 10/25/2017   Paroxysmal atrial fibrillation (HCC)    Episodes rapid atrial fib noted by pacemaker interrogation, August, 2011, diltiazem added, patient improved. Patient continues on Rythmol  //   Changed to flecainide 2013  //   flecainide level checked in 2013, good level    Persistent atrial fibrillation (HCC)    Patient Active  Problem List   Diagnosis Date Noted   Chest pain 09/13/2023   Left shoulder pain 06/18/2023   Acute gastritis 05/27/2023   BRBPR (bright red blood per rectum) 05/24/2023   COVID-19 virus infection 03/11/2023   Infected wound 01/24/2023   Contusion of great toe of left foot 01/13/2023   Numbness of fingers of both hands 09/18/2022   Fall 08/24/2022   UTI (urinary tract infection) 07/23/2022   Senile dementia (HCC) 06/29/2022   Pain in left foot 06/29/2022   PAD (peripheral artery disease) (HCC) 03/30/2022   Mild cognitive impairment 03/26/2022   Insomnia secondary to depression with anxiety 03/23/2022   Osteoarthritis, multiple sites 03/23/2022   Chronic pain of both shoulders 03/09/2022   Candidiasis of skin 03/09/2022   CKD (chronic kidney disease) stage 3, GFR 30-59 ml/min (HCC) 02/22/2022   Cerebral microvascular disease 02/19/2022   Hypokalemia, inadequate intake 02/19/2022   Cellulitis of left anterior lower leg 02/15/2022   Sepsis (HCC) 02/12/2022   Aortic valve disorder 10/19/2021   Congestive heart failure (HCC) 10/19/2021   Eczema 10/19/2021   Other specified disorders of bone density and structure, other site 10/19/2021   History of colonic polyps 10/19/2021   Pure hypertriglyceridemia 10/19/2021   Urinary incontinence 10/19/2021   Sinus node dysfunction (HCC) 10/19/2021   Esophageal dysphagia 10/19/2021   Secondary hypercoagulable state (HCC) 12/30/2019   Lower extremity edema 01/22/2018   History of total knee replacement, right 10/25/2017   Osteoarthritis of left knee 10/25/2017   Persistent atrial fibrillation (  HCC)    Hypertension    Hair loss    Aortic stenosis 03/08/2016   Chronic anticoagulation 12/17/2014   Mitral regurgitation 05/03/2014   Edema leg 04/28/2014   GERD (gastroesophageal reflux disease) 04/28/2014   Fatigue 12/26/2011   Ejection fraction    Normal nuclear stress test    Nausea    Brady-tachy syndrome (HCC)    Hypothyroidism     Paroxysmal atrial fibrillation [facility])    Drug therapy    Drug therapy    Pacemaker    Cough    Essential hypertension, benign 07/20/2010   Home Medication(s) Prior to Admission medications   Medication Sig Start Date End Date Taking? Authorizing Provider  acetaminophen (TYLENOL) 500 MG tablet Take 1,000 mg by mouth in the morning and at bedtime.    [provider]  amLODipine (NORVASC) 2.5 MG tablet Take 1 tablet (2.5 mg total) by mouth daily. 08/21/23   Medina-Vargas, Monina C, NP  Apoaequorin (PREVAGEN) 10 MG CAPS Take 10 mg by mouth daily. 11/13/22   [name], MD  betamethasone, augmented, (DIPROLENE) 0.05 % gel Apply topically daily.  Apply to scalp topically every 12 hours as needed for psoriasis related to PSORIASIS, UNSPECIFIED (L40.9) apply to affected area of scalp 11/13/22   [name], MD  Calcium Carbonate-Vit D-Min (CALCIUM 600+D PLUS MINERALS) 600-400 MG-UNIT TABS Take 1 tablet by mouth daily. 11/13/22   [name], MD  Carboxymethylcell-Glycerin PF (REFRESH RELIEVA PF) 0.5-1 % SOLN Apply 1 drop to eye in the morning and at bedtime. for relieve discomfort 11/13/22   [name], MD  diclofenac Sodium (VOLTAREN) 1 % GEL Apply 1 g topically 2 (two) times daily as needed.    [provider]  ELIQUIS 5 MG TABS tablet TAKE ONE TABLET BY MOUTH TWICE A DAY 03/18/23   Venita Sheffield, MD  flecainide (TAMBOCOR) 50 MG tablet Take 1 tablet (50 mg total) by mouth 2 (two) times daily. 11/13/22   [name], MD  furosemide (LASIX) 20 MG tablet TAKE ONE TABLET BY MOUTH EVERY DAY 03/19/23   Venita Sheffield, MD  hydrocortisone 2.5 % ointment as needed (rash). Apply to affected areas topically as needed for Rashy/itchy areas two times daily-MAY SELF ADMINISTER-per pt request    [provider]  Ipratropium-Albuterol (COMBIVENT) 20-100 MCG/ACT AERS respimat Inhale 1 puff into the lungs every 6 (six) hours.    [provider]   levothyroxine (SYNTHROID) 100 MCG tablet Take 1 tablet (100 mcg total) by mouth every morning. 11/13/22   [name], MD  metoprolol succinate (TOPROL-XL) 25 MG 24 hr tablet Take 3 tablets (75 mg total) by mouth daily. 11/13/22   [name], MD  Multiple Vitamin (MULTIVITAMIN ADULT) TABS Take 1 tablet by mouth daily.    [provider]  Multiple Vitamins-Minerals (OCUVITE PO) Take 1 capsule by mouth daily.    [provider]  NAMENDA 10 MG tablet TAKE ONE TABLET BY MOUTH TWICE A DAY FOR COGNITIVE DECLINE 04/23/23   Venita Sheffield, MD  nystatin cream (MYCOSTATIN) Apply 1 Application topically every 12 (twelve) hours as needed for dry skin.    [provider]  pantoprazole (PROTONIX) 40 MG tablet Take 40 mg by mouth daily.    [provider]  potassium chloride (KLOR-CON M) 10 MEQ tablet Take 1 tablet (10 mEq total) by mouth daily. 06/28/23   Venita Sheffield, MD  sodium chloride (MURO 128) 5 % ophthalmic ointment Place 1 Application  into both eyes at bedtime.    [provider]  sodium chloride (MURO 128) 5 % ophthalmic solution Place 1 drop into both eyes in the morning and at bedtime. for decrease swelling Wait 1 hour between these and Refresh 07/10/22   [name], MD                                                                                                                                    Past Surgical History Past Surgical History:  Procedure Laterality Date   ABDOMINAL HYSTERECTOMY     EP IMPLANTABLE DEVICE N/A 08/08/2015   Generator change with [employer] DR PPM by Dr Johney Frame   JOINT REPLACEMENT     PACEMAKER PLACEMENT  2009       Family History Family History  Problem Relation Age of Onset   Heart disease Father    Coronary artery disease Other    Heart disease Son     Social History Social History   Tobacco Use   Smoking status: Former    Current packs/day: 0.00    Types: Cigarettes    Quit  date: 09/06/1990    Years since quitting: 33.0   Smokeless tobacco: Never  Vaping Use   Vaping status: Never Used  Substance Use Topics   Alcohol use: No   Drug use: No   Allergies Morphine and codeine, Penicillins, Lasix [furosemide], Aspirin, Clindamycin/lincomycin, Crestor [rosuvastatin], Diltiazem, Erythromycin, Lisinopril, Metronidazole, Morphine, and Penicillins cross reactors  Review of Systems Review of Systems  All other systems reviewed and are negative.   Physical Exam Vital Signs  I have reviewed the triage vital signs BP 113/64   Pulse (!) 114   Temp 98.2 F (36.8 C)   Resp 17   Wt 91.7 kg   SpO2 97%   BMI 33.64 kg/m  Physical Exam Vitals and nursing note reviewed.  Constitutional:      General: She is not in acute distress.    Appearance: She is well-developed.  HENT:     Head: Normocephalic and atraumatic.     Mouth/Throat:     Mouth: Mucous membranes are moist.  Eyes:     Pupils: Pupils are equal, round, and reactive to light.  Cardiovascular:     Rate and Rhythm: Regular rhythm. Tachycardia present.     Heart sounds: No murmur heard. Pulmonary:     Effort: Pulmonary effort is normal. No respiratory distress.     Breath sounds: Normal breath sounds.  Abdominal:     General: Abdomen is flat.     Palpations: Abdomen is soft.     Tenderness: There is no abdominal tenderness.  Musculoskeletal:        General: No tenderness.     Right lower leg: No edema.     Left lower leg: No edema.  Skin:    General: Skin is warm and dry.  Neurological:  General: No focal deficit present.     Mental Status: She is alert. Mental status is at baseline.     Comments: Oriented x 3, following commands, no cranial nerve deficit, moves all 4 extremities equally.  Psychiatric:        Mood and Affect: Mood normal.        Behavior: Behavior normal.     ED Results and Treatments Labs (all labs ordered are listed, but only abnormal results are displayed) Labs  Reviewed  COMPREHENSIVE METABOLIC PANEL WITH GFR - Abnormal; Notable for the following components:      Result Value   Glucose, Bld 118 (*)    BUN 25 (*)    Albumin 3.3 (*)    AST 63 (*)    All other components within normal limits  CBC WITH DIFFERENTIAL/PLATELET - Abnormal; Notable for the following components:   Monocytes Absolute 1.1 (*)    All other components within normal limits  BRAIN NATRIURETIC PEPTIDE - Abnormal; Notable for the following components:   B Natriuretic Peptide 376.4 (*)    All other components within normal limits  TROPONIN I (HIGH SENSITIVITY) - Abnormal; Notable for the following components:   Troponin I (High Sensitivity) 82 (*)    All other components within normal limits  RESP PANEL BY RT-PCR (RSV, FLU A&B, COVID)  RVPGX2  URINALYSIS, W/ REFLEX TO CULTURE (INFECTION SUSPECTED)  TROPONIN I (HIGH SENSITIVITY)                                                                                                                          Radiology CT Head Wo Contrast Result Date: 09/13/2023 CLINICAL DATA:  Headache, new onset. EXAM: CT HEAD WITHOUT CONTRAST TECHNIQUE: Contiguous axial images were obtained from the base of the skull through the vertex without intravenous contrast. RADIATION DOSE REDUCTION: This exam was performed according to the departmental dose-optimization program which includes automated exposure control, adjustment of the mA and/or kV according to patient size and/or use of iterative reconstruction technique. COMPARISON:  CT head without contrast 02/12/2022. FINDINGS: Brain: Moderate atrophy and white matter changes are similar to the prior exam. No acute infarct, hemorrhage, or mass lesion is present. The ventricles are proportionate to the degree of atrophy. No significant extraaxial fluid collection is present. The brainstem and cerebellum are within normal limits. Midline structures are within normal limits. Vascular: Atherosclerotic calcifications  are present within the cavernous internal carotid arteries bilaterally. No hyperdense vessel is present. Skull: Calvarium is intact. No focal lytic or blastic lesions are present. No significant extracranial soft tissue lesion is present. Sinuses/Orbits: The left anterior paranasal sinuses are opacified. The paranasal sinuses and mastoid air cells are otherwise clear. Bilateral lens replacements are noted. Globes and orbits are otherwise unremarkable. IMPRESSION: 1. No acute intracranial abnormality or significant interval change. 2. Moderate atrophy and white matter disease likely reflects the sequela of chronic microvascular ischemia. 3. Left anterior paranasal sinus disease. Electronically Signed   By: Cristal Deer  Mattern M.D.   On: 09/13/2023 21:09   DG Chest Port 1 View Result Date: 09/13/2023 CLINICAL DATA:  Weakness EXAM: PORTABLE CHEST 1 VIEW COMPARISON:  08/21/2023 FINDINGS: Left-sided pacing device as before. Mild cardiomegaly. Bronchitic changes. Streaky atelectasis or scarring at the bases. Aortic atherosclerosis. No pneumothorax IMPRESSION: Mild cardiomegaly. Bronchitic changes. Streaky atelectasis or scarring at the bases. Electronically Signed   By: Jasmine Pang M.D.   On: 09/13/2023 21:01    Pertinent labs & imaging results that were available during my care of the patient were reviewed by me and considered in my medical decision making (see MDM for details).  Medications Ordered in ED Medications  sodium chloride 0.9 % bolus 500 mL (500 mLs Intravenous New Bag/Given 09/13/23 2128)                                                                                                                                     Procedures Procedures  (including critical care time)  Medical Decision Making / ED Course   MDM:  89 year old female presenting to the emergency department with episode of chest pain.  Patient overall well-appearing, physical examination without focal finding.  Vitals  notable for elevated heart rate, EKG shows paced rhythm, but unclear why this would be elevated.  Will interrogate pacemaker.  Differential includes ACS, will check troponin x 2 given recent onset.  Differential also includes pulmonary process such as pneumonia, pneumothorax.  Doubt pulmonary embolism, patient on chronic Eliquis.  Could also be GI related such as GERD given pain began after eating.  Patient reports that she had a mild headache earlier which is resolved, given her age since she is on blood thinners will obtain CT head.  Will reassess.   Clinical Course as of 09/13/23 2320  Fri Sep 13, 2023  2317 Pacemaker showed atrial fibrillation.  Started around the same time of this patient symptoms began.  Patient self converted back to normal paced rhythm.  Discussed with Dr. Hyacinth Meeker with cardiology, agrees with overnight observation given elevated troponin.  He will have EP see patient in the morning.  Request we message him if his second troponin comes back much higher.  Discussed with Dr. Margo Aye with cardiology, she will admit patient for observation.  Patient reports she feels very well right now.  Discussed with patient and daughter at bedside. [WS]    Clinical Course User Index [WS] Lonell Grandchild, MD     Additional history obtained: -Additional history obtained from family and ems -External records from outside source obtained and reviewed including: Chart review including previous notes, labs, imaging, consultation notes including prior notes    Lab Tests: -I ordered, reviewed, and interpreted labs.   The pertinent results include:   Labs Reviewed  COMPREHENSIVE METABOLIC PANEL WITH GFR - Abnormal; Notable for the following components:      Result Value   Glucose, Bld 118 (*)  BUN 25 (*)    Albumin 3.3 (*)    AST 63 (*)    All other components within normal limits  CBC WITH DIFFERENTIAL/PLATELET - Abnormal; Notable for the following components:   Monocytes Absolute  1.1 (*)    All other components within normal limits  BRAIN NATRIURETIC PEPTIDE - Abnormal; Notable for the following components:   B Natriuretic Peptide 376.4 (*)    All other components within normal limits  TROPONIN I (HIGH SENSITIVITY) - Abnormal; Notable for the following components:   Troponin I (High Sensitivity) 82 (*)    All other components within normal limits  RESP PANEL BY RT-PCR (RSV, FLU A&B, COVID)  RVPGX2  URINALYSIS, W/ REFLEX TO CULTURE (INFECTION SUSPECTED)  TROPONIN I (HIGH SENSITIVITY)    Notable for elevated troponin  EKG   EKG Interpretation Date/Time:  Friday September 13 2023 22:25:49 EDT Ventricular Rate:  66 PR Interval:  239 QRS Duration:  102 QT Interval:  438 QTC Calculation: 459 R Axis:   5  Text Interpretation: Sinus or atrial paced rhythm LVH with secondary repolarization abnormality Compared to previous tracing tachycardia has resolved Appears similar to ecg 05/15/23 Confirmed by Alvino Blood ([id number]) on 09/13/2023 10:31:26 PM         Imaging Studies ordered: I ordered imaging studies including chest x-Laser, CT head On my interpretation imaging demonstrates no acute process I independently visualized and interpreted imaging. I agree with the radiologist interpretation   Medicines ordered and prescription drug management: Meds ordered this encounter  Medications   sodium chloride 0.9 % bolus 500 mL    -I have reviewed the patients home medicines and have made adjustments as needed   Consultations Obtained: I requested consultation with the cardiologist,  and discussed lab and imaging findings as well as pertinent plan - they recommend: Admit for observation   Cardiac Monitoring: The patient was maintained on a cardiac monitor.  I personally viewed and interpreted the cardiac monitored which showed an underlying rhythm of: Wide-complex regular tachycardia  Social Determinants of Health:  Diagnosis or treatment significantly limited by  social determinants of health: former smoker   Reevaluation: After the interventions noted above, I reevaluated the patient and found that their symptoms have improved  Co morbidities that complicate the patient evaluation  Past Medical History:  Diagnosis Date   Aortic stenosis 03/08/2016   Brady-tachy syndrome (HCC)    a. Biotronik dual chamber PPM implanted 2009 b. gen change to STJ dual chamber PPM 2017   Ejection fraction    EF 70%, echo, October, 2009  //   EF 55-60%, septal dyssynergy consistent with a paced rhythm, mild to moderate mitral regurgitation, echo, November, 2015    GERD (gastroesophageal reflux disease) 04/28/2014   Episodes November, 2015 with fluid refluxing from her esophagus.    Hair loss    Patient questioned Coumadin, changed toPradaxa   Hypertension    Hypothyroidism    Lower extremity edema 01/22/2018   Mitral regurgitation 05/03/2014   Mild-to-moderate mitral regurgitation, echo, November, 2015    Osteoarthritis of left knee 10/25/2017   Paroxysmal atrial fibrillation (HCC)    Episodes rapid atrial fib noted by pacemaker interrogation, August, 2011, diltiazem added, patient improved. Patient continues on Rythmol  //   Changed to flecainide 2013  //   flecainide level checked in 2013, good level    Persistent atrial fibrillation (HCC)       Dispostion: Disposition decision including need for hospitalization was considered, and patient admitted  to the hospital.    Final Clinical Impression(s) / ED Diagnoses Final diagnoses:  Chest pain, unspecified type     This chart was dictated using voice recognition software.  Despite best efforts to proofread,  errors can occur which can change the documentation meaning.    Lonell Grandchild, MD 09/13/23 [phone]

## 2023-09-13 NOTE — Telephone Encounter (Signed)
 Pharmacy requested refill.  Patient is ALF Pended and sent to Dr. Jacquenette Shone for approval.

## 2023-09-13 NOTE — Patient Instructions (Addendum)
 Medication Instructions:  The current medical regimen is effective;  continue present plan and medications as directed. Please refer to the Current Medication list given to you today.  *If you need a refill on your cardiac medications before your next appointment, please call your pharmacy*  Lab Work: NONE  Testing/Procedures: ECHO IS SCHEDULED IN [address]; [place], [place] [address]  Follow-Up: At [facility], you and your health needs are our priority.  As part of our continuing mission to provide you with exceptional heart care, our providers are all part of one team.  This team includes your primary Cardiologist (physician) and Advanced Practice Providers or APPs (Physician Assistants and Nurse Practitioners) who all work together to provide you with the care you need, when you need it.  Your next appointment:   6 month(s)  Provider:   Parke Poisson, MD    ALSO SCHEDULE APPOINTMENT IN 2 MONTHS WITH Lanier Prude, MD      1st Floor: - Lobby - Registration  - Pharmacy  - Lab - Cafe  2nd Floor: - PV Lab - Diagnostic Testing (echo, CT, nuclear med)  3rd Floor: - Vacant  4th Floor: - TCTS (cardiothoracic surgery) - AFib Clinic - Structural Heart Clinic - Vascular Surgery  - Vascular Ultrasound  5th Floor: - HeartCare Cardiology (general and EP) - Clinical Pharmacy for coumadin, hypertension, lipid, weight-loss medications, and med management appointments    Valet parking services will be available as well.

## 2023-09-13 NOTE — ED Triage Notes (Signed)
 PT BIB [facility]. PT started having chest pain around 6pm right after dinner. With EMS pt showed to be ventricular paced. PT has been having weakness for the last 2 days. BG 126. GCS 14.

## 2023-09-13 NOTE — ED Notes (Signed)
 CCMD called told this nurse pt converted to 1st degree HB, new EKG done and given to attending

## 2023-09-13 NOTE — H&P (Signed)
 History and Physical  [name] ZOX:[id number] DOB: [birth year] DOA: 09/13/2023  Referring physician: Dr. Suezanne Jacquet, EDP  PCP: Venita Sheffield, MD  Outpatient Specialists: Cardiology Patient coming from: Assisted living facility.  Chief Complaint: Chest pain  HPI: [name] is a 89 y.o. female with medical history significant for moderate to severe aortic stenosis, sick sinus syndrome status post pacemaker placement, paroxysmal A-fib on Eliquis, hypertension, hypothyroidism, who presents to the ER via EMS from assisted living facility with complaints of sudden onset chest pain after eating dinner tonight.  Associated with shortness of breath, and generalized weakness for the past 2 days.  Could not elaborate further on the chest pain.  In the ER, her pacemaker was interrogated, showing atrial fibrillation which started around the same time as her symptoms began.  She self converted to normal paced rhythm.  Lab studies notable for elevated troponin with high delta.  Received full dose aspirin and was started on heparin drip.  Seen by cardiology, recommended holding off on heparin drip and restarting home Eliquis.  Admitted by [facility], hospitalist service.  At the time of this visit, the patient's chest pain had resolved.  She endorses lower back pain.  UA positive for pyuria.  Denies dysuria however admits to flank pain.  Started on Rocephin empirically.  ED Course: Temperature 97.9.  BP 134/60, pulse 60, respiratory 19, O2 saturation 96% on room air.  Review of Systems: Review of systems as noted in the HPI. All other systems reviewed and are negative.   Past Medical History:  Diagnosis Date   Aortic stenosis 03/08/2016   Brady-tachy syndrome (HCC)    a. Biotronik dual chamber PPM implanted 2009 b. gen change to STJ dual chamber PPM 2017   Ejection fraction    EF 70%, echo, October, 2009  //   EF 55-60%, septal dyssynergy consistent with a paced rhythm, mild to moderate mitral  regurgitation, echo, November, 2015    GERD (gastroesophageal reflux disease) 04/28/2014   Episodes November, 2015 with fluid refluxing from her esophagus.    Hair loss    Patient questioned Coumadin, changed toPradaxa   Hypertension    Hypothyroidism    Lower extremity edema 01/22/2018   Mitral regurgitation 05/03/2014   Mild-to-moderate mitral regurgitation, echo, November, 2015    Osteoarthritis of left knee 10/25/2017   Paroxysmal atrial fibrillation (HCC)    Episodes rapid atrial fib noted by pacemaker interrogation, August, 2011, diltiazem added, patient improved. Patient continues on Rythmol  //   Changed to flecainide 2013  //   flecainide level checked in 2013, good level    Persistent atrial fibrillation (HCC)    Past Surgical History:  Procedure Laterality Date   ABDOMINAL HYSTERECTOMY     EP IMPLANTABLE DEVICE N/A 08/08/2015   Generator change with [employer] DR PPM by Dr Johney Frame   JOINT REPLACEMENT     PACEMAKER PLACEMENT  2009        Social History:  reports that she quit smoking about 33 years ago. Her smoking use included cigarettes. She has never used smokeless tobacco. She reports that she does not drink alcohol and does not use drugs.     Family History  Problem Relation Age of Onset   Heart disease Father    Coronary artery disease Other    Heart disease Son       Prior to Admission medications   Medication Sig Start Date End Date Taking? Authorizing Provider  acetaminophen (TYLENOL) 500 MG tablet Take 1,000  mg by mouth in the morning and at bedtime.    [provider]  amLODipine (NORVASC) 2.5 MG tablet Take 1 tablet (2.5 mg total) by mouth daily. 08/21/23   Medina-Vargas, Monina C, NP  Apoaequorin (PREVAGEN) 10 MG CAPS Take 10 mg by mouth daily. 11/13/22   [name], MD  betamethasone, augmented, (DIPROLENE) 0.05 % gel Apply topically daily.  Apply to scalp topically every 12 hours as needed for psoriasis related to PSORIASIS, UNSPECIFIED  (L40.9) apply to affected area of scalp 11/13/22   [name], MD  Calcium Carbonate-Vit D-Min (CALCIUM 600+D PLUS MINERALS) 600-400 MG-UNIT TABS Take 1 tablet by mouth daily. 11/13/22   [name], MD  Carboxymethylcell-Glycerin PF (REFRESH RELIEVA PF) 0.5-1 % SOLN Apply 1 drop to eye in the morning and at bedtime. for relieve discomfort 11/13/22   [name], MD  diclofenac Sodium (VOLTAREN) 1 % GEL Apply 1 g topically 2 (two) times daily as needed.    [provider]  ELIQUIS 5 MG TABS tablet TAKE ONE TABLET BY MOUTH TWICE A DAY 03/18/23   Venita Sheffield, MD  flecainide (TAMBOCOR) 50 MG tablet Take 1 tablet (50 mg total) by mouth 2 (two) times daily. 11/13/22   [name], MD  furosemide (LASIX) 20 MG tablet TAKE ONE TABLET BY MOUTH EVERY DAY 03/19/23   Venita Sheffield, MD  hydrocortisone 2.5 % ointment as needed (rash). Apply to affected areas topically as needed for Rashy/itchy areas two times daily-MAY SELF ADMINISTER-per pt request    [provider]  Ipratropium-Albuterol (COMBIVENT) 20-100 MCG/ACT AERS respimat Inhale 1 puff into the lungs every 6 (six) hours.    [provider]  levothyroxine (SYNTHROID) 100 MCG tablet Take 1 tablet (100 mcg total) by mouth every morning. 11/13/22   [name], MD  metoprolol succinate (TOPROL-XL) 25 MG 24 hr tablet Take 3 tablets (75 mg total) by mouth daily. 11/13/22   [name], MD  Multiple Vitamin (MULTIVITAMIN ADULT) TABS Take 1 tablet by mouth daily.    [provider]  Multiple Vitamins-Minerals (OCUVITE PO) Take 1 capsule by mouth daily.    [provider]  NAMENDA 10 MG tablet TAKE ONE TABLET BY MOUTH TWICE A DAY FOR COGNITIVE DECLINE 04/23/23   Venita Sheffield, MD  nystatin cream (MYCOSTATIN) Apply 1 Application topically every 12 (twelve) hours as needed for dry skin.    [provider]  pantoprazole (PROTONIX) 40 MG tablet Take 40 mg by mouth  daily.    [provider]  potassium chloride (KLOR-CON M) 10 MEQ tablet Take 1 tablet (10 mEq total) by mouth daily. 06/28/23   Venita Sheffield, MD  sodium chloride (MURO 128) 5 % ophthalmic ointment Place 1 Application into both eyes at bedtime.    [provider]  sodium chloride (MURO 128) 5 % ophthalmic solution Place 1 drop into both eyes in the morning and at bedtime. for decrease swelling Wait 1 hour between these and Refresh 07/10/22   [name], MD    Physical Exam: BP 113/64   Pulse (!) 114   Temp 98.2 F (36.8 C)   Resp 17   Wt 91.7 kg   SpO2 97%   BMI 33.64 kg/m   General: 89 y.o. year-old female well developed well nourished in no acute distress.  Alert and interactive. Cardiovascular: Regular rate and rhythm with no rubs or gallops.  No thyromegaly or JVD noted.  No lower extremity edema.  2/4 pulses in all 4 extremities. Respiratory: Clear to auscultation with no wheezes or rales. Good inspiratory effort. Abdomen: Soft nontender nondistended with normal bowel sounds x4 quadrants. Muskuloskeletal: No cyanosis, clubbing or edema noted bilaterally Neuro: CN II-XII intact, strength, sensation, reflexes Skin: No ulcerative lesions noted or rashes Psychiatry: Judgement and insight appear normal. Mood is appropriate for condition and setting          Labs on Admission:  Basic Metabolic Panel: Recent Labs  Lab 09/13/23 2041  NA 141  K 4.0  CL 105  CO2 24  GLUCOSE 118*  BUN 25*  CREATININE 0.88  CALCIUM 9.2   Liver Function Tests: Recent Labs  Lab 09/13/23 2041  AST 63*  ALT 38  ALKPHOS 46  BILITOT 0.7  PROT 6.6  ALBUMIN 3.3*   No results for input(s): "LIPASE", "AMYLASE" in the last 168 hours. No results for input(s): "AMMONIA" in the last 168 hours. CBC: Recent Labs  Lab 09/13/23 2041  WBC 8.3  NEUTROABS 5.1  HGB 14.9  HCT 44.8  MCV 98.5  PLT 154   Cardiac Enzymes: No results for input(s): "CKTOTAL", "CKMB",  "CKMBINDEX", "TROPONINI" in the last 168 hours.  BNP (last 3 results) Recent Labs    09/13/23 2041  BNP 376.4*    ProBNP (last 3 results) No results for input(s): "PROBNP" in the last 8760 hours.  CBG: No results for input(s): "GLUCAP" in the last 168 hours.  Radiological Exams on Admission: CT Head Wo Contrast Result Date: 09/13/2023 CLINICAL DATA:  Headache, new onset. EXAM: CT HEAD WITHOUT CONTRAST TECHNIQUE: Contiguous axial images were obtained from the base of the skull through the vertex without intravenous contrast. RADIATION DOSE REDUCTION: This exam was performed according to the departmental dose-optimization program which includes automated exposure control, adjustment of the mA and/or kV according to patient size and/or use of iterative reconstruction technique. COMPARISON:  CT head without contrast 02/12/2022. FINDINGS: Brain: Moderate atrophy and white matter changes are similar to the prior exam. No acute infarct, hemorrhage, or mass lesion is present. The ventricles are proportionate to the degree of atrophy. No significant extraaxial fluid collection is present. The brainstem and cerebellum are within normal limits. Midline structures are within normal limits. Vascular: Atherosclerotic calcifications are present within the cavernous internal carotid arteries bilaterally. No hyperdense vessel is present. Skull: Calvarium is intact. No focal lytic or blastic lesions are present. No significant extracranial soft tissue lesion is present. Sinuses/Orbits: The left anterior paranasal sinuses are opacified. The paranasal sinuses and mastoid air cells are otherwise clear. Bilateral lens replacements are noted. Globes and orbits are otherwise unremarkable. IMPRESSION: 1. No acute intracranial abnormality or significant interval change. 2. Moderate atrophy and white matter disease likely reflects the sequela of chronic microvascular ischemia. 3. Left anterior paranasal sinus disease.  Electronically Signed   By: Marin Roberts M.D.   On: 09/13/2023 21:09   DG Chest Port 1 View Result Date: 09/13/2023 CLINICAL DATA:  Weakness EXAM: PORTABLE CHEST 1 VIEW COMPARISON:  08/21/2023 FINDINGS: Left-sided pacing device as before. Mild cardiomegaly. Bronchitic changes. Streaky atelectasis or scarring at the bases. Aortic atherosclerosis. No pneumothorax IMPRESSION: Mild cardiomegaly. Bronchitic changes. Streaky atelectasis or scarring at the bases. Electronically Signed   By: Jasmine Pang M.D.   On: 09/13/2023 21:01    EKG: I independently viewed the EKG done and my findings are as followed: Sinus rhythm rate of 60.  Nonspecific ST-T changes.  QTc 465.  Assessment/Plan Present on Admission:  Chest pain  Principal Problem:   Chest pain  NSTEMI Presented with chest pain, at rest Received full dose aspirin in the ER Initially started on heparin drip, DC'd per cardiology Resume home Eliquis and flecainide as recommended by cardiology Follow 2D echo and lipid panel Monitor on telemetry Rest of management per cardiology  Presumptive UTI, POA UA positive for pyuria, flank pain Started on Rocephin Monitor urine culture for ID and sensitivities De-escalate IV antibiotics when appropriate  History of sick sinus syndrome status post pacemaker placement Monitor on telemetry  Chronic HFpEF Euvolemic on exam Strict I's and O's and daily weight  Hypothyroidism Resume home levothyroxine  Generalized weakness PT OT assessment Fall precautions   Critical care time: 65 minutes.   DVT prophylaxis: Home Eliquis  Code Status: DNR  Family Communication: Daughter at bedside  Disposition Plan: Admitted to progressive care unit  Consults called: Cardiology consulted  Admission status: Inpatient status.   Status is: Inpatient The patient requires at least 2 midnights for further evaluation and treatment of present condition.   Darlin Drop MD [facility] Pager [phone]  If 7PM-7AM, please contact night-coverage [email or web address] Password [email or web address]  09/13/2023, 11:18 PM

## 2023-09-14 ENCOUNTER — Inpatient Hospital Stay (HOSPITAL_COMMUNITY)

## 2023-09-14 DIAGNOSIS — I350 Nonrheumatic aortic (valve) stenosis: Secondary | ICD-10-CM | POA: Diagnosis not present

## 2023-09-14 DIAGNOSIS — I4891 Unspecified atrial fibrillation: Secondary | ICD-10-CM

## 2023-09-14 DIAGNOSIS — Z79890 Hormone replacement therapy (postmenopausal): Secondary | ICD-10-CM | POA: Diagnosis not present

## 2023-09-14 DIAGNOSIS — Z7901 Long term (current) use of anticoagulants: Secondary | ICD-10-CM | POA: Diagnosis not present

## 2023-09-14 DIAGNOSIS — I4819 Other persistent atrial fibrillation: Secondary | ICD-10-CM | POA: Diagnosis present

## 2023-09-14 DIAGNOSIS — R000 Tachycardia, unspecified: Secondary | ICD-10-CM

## 2023-09-14 DIAGNOSIS — E039 Hypothyroidism, unspecified: Secondary | ICD-10-CM

## 2023-09-14 DIAGNOSIS — M1712 Unilateral primary osteoarthritis, left knee: Secondary | ICD-10-CM | POA: Diagnosis present

## 2023-09-14 DIAGNOSIS — I5032 Chronic diastolic (congestive) heart failure: Secondary | ICD-10-CM | POA: Diagnosis present

## 2023-09-14 DIAGNOSIS — Z95811 Presence of heart assist device: Secondary | ICD-10-CM | POA: Diagnosis not present

## 2023-09-14 DIAGNOSIS — K219 Gastro-esophageal reflux disease without esophagitis: Secondary | ICD-10-CM | POA: Diagnosis present

## 2023-09-14 DIAGNOSIS — E669 Obesity, unspecified: Secondary | ICD-10-CM | POA: Diagnosis present

## 2023-09-14 DIAGNOSIS — F0394 Unspecified dementia, unspecified severity, with anxiety: Secondary | ICD-10-CM | POA: Diagnosis present

## 2023-09-14 DIAGNOSIS — R079 Chest pain, unspecified: Secondary | ICD-10-CM | POA: Diagnosis present

## 2023-09-14 DIAGNOSIS — I214 Non-ST elevation (NSTEMI) myocardial infarction: Secondary | ICD-10-CM | POA: Diagnosis present

## 2023-09-14 DIAGNOSIS — I480 Paroxysmal atrial fibrillation: Secondary | ICD-10-CM | POA: Diagnosis not present

## 2023-09-14 DIAGNOSIS — Z66 Do not resuscitate: Secondary | ICD-10-CM | POA: Diagnosis present

## 2023-09-14 DIAGNOSIS — Z87891 Personal history of nicotine dependence: Secondary | ICD-10-CM | POA: Diagnosis not present

## 2023-09-14 DIAGNOSIS — Z950 Presence of cardiac pacemaker: Secondary | ICD-10-CM | POA: Diagnosis not present

## 2023-09-14 DIAGNOSIS — B9620 Unspecified Escherichia coli [E. coli] as the cause of diseases classified elsewhere: Secondary | ICD-10-CM | POA: Diagnosis present

## 2023-09-14 DIAGNOSIS — I080 Rheumatic disorders of both mitral and aortic valves: Secondary | ICD-10-CM | POA: Diagnosis present

## 2023-09-14 DIAGNOSIS — Z1611 Resistance to penicillins: Secondary | ICD-10-CM | POA: Diagnosis present

## 2023-09-14 DIAGNOSIS — I495 Sick sinus syndrome: Secondary | ICD-10-CM | POA: Diagnosis present

## 2023-09-14 DIAGNOSIS — Z8616 Personal history of COVID-19: Secondary | ICD-10-CM | POA: Diagnosis not present

## 2023-09-14 DIAGNOSIS — Z79899 Other long term (current) drug therapy: Secondary | ICD-10-CM | POA: Diagnosis not present

## 2023-09-14 DIAGNOSIS — Z6833 Body mass index (BMI) 33.0-33.9, adult: Secondary | ICD-10-CM | POA: Diagnosis not present

## 2023-09-14 DIAGNOSIS — I110 Hypertensive heart disease with heart failure: Secondary | ICD-10-CM | POA: Diagnosis present

## 2023-09-14 DIAGNOSIS — I4892 Unspecified atrial flutter: Secondary | ICD-10-CM | POA: Diagnosis present

## 2023-09-14 DIAGNOSIS — N390 Urinary tract infection, site not specified: Secondary | ICD-10-CM | POA: Diagnosis present

## 2023-09-14 LAB — URINALYSIS, W/ REFLEX TO CULTURE (INFECTION SUSPECTED)
Bilirubin Urine: NEGATIVE
Glucose, UA: NEGATIVE mg/dL
Hgb urine dipstick: NEGATIVE
Ketones, ur: NEGATIVE mg/dL
Nitrite: NEGATIVE
Protein, ur: 30 mg/dL — AB
Specific Gravity, Urine: 1.016 (ref 1.005–1.030)
WBC, UA: >50 WBC/hpf (ref 0–5)
pH: 5 (ref 5.0–8.0)

## 2023-09-14 LAB — MAGNESIUM: Magnesium: 2.1 mg/dL (ref 1.7–2.4)

## 2023-09-14 LAB — CBC
HCT: 41.9 % (ref 36.0–46.0)
Hemoglobin: 14 g/dL (ref 12.0–15.0)
MCH: 32.9 pg (ref 26.0–34.0)
MCHC: 33.4 g/dL (ref 30.0–36.0)
MCV: 98.6 fL (ref 80.0–100.0)
Platelets: 134 10*3/uL — ABNORMAL LOW (ref 150–400)
RBC: 4.25 MIL/uL (ref 3.87–5.11)
RDW: 13.5 % (ref 11.5–15.5)
WBC: 6.5 10*3/uL (ref 4.0–10.5)
nRBC: 0 % (ref 0.0–0.2)

## 2023-09-14 LAB — BASIC METABOLIC PANEL WITH GFR
Anion gap: 9 (ref 5–15)
BUN: 27 mg/dL — ABNORMAL HIGH (ref 8–23)
CO2: 25 mmol/L (ref 22–32)
Calcium: 8.9 mg/dL (ref 8.9–10.3)
Chloride: 107 mmol/L (ref 98–111)
Creatinine, Ser: 0.9 mg/dL (ref 0.44–1.00)
GFR, Estimated: 59 mL/min — ABNORMAL LOW (ref >60)
Glucose, Bld: 106 mg/dL — ABNORMAL HIGH (ref 70–99)
Potassium: 3.8 mmol/L (ref 3.5–5.1)
Sodium: 141 mmol/L (ref 135–145)

## 2023-09-14 LAB — LIPID PANEL
Cholesterol: 159 mg/dL (ref 0–200)
HDL: 42 mg/dL (ref >40)
LDL Cholesterol: 100 mg/dL — ABNORMAL HIGH (ref 0–99)
Total CHOL/HDL Ratio: 3.8 ratio
Triglycerides: 85 mg/dL (ref <150)
VLDL: 17 mg/dL (ref 0–40)

## 2023-09-14 LAB — TROPONIN I (HIGH SENSITIVITY)
Troponin I (High Sensitivity): 539 ng/L (ref <18)
Troponin I (High Sensitivity): 2709 ng/L (ref <18)

## 2023-09-14 LAB — ECHOCARDIOGRAM COMPLETE
AR max vel: 0.83 cm2
AV Area VTI: 0.74 cm2
AV Area mean vel: 0.75 cm2
AV Mean grad: 35 mmHg
AV Peak grad: 61.8 mmHg
Ao pk vel: 3.93 m/s
Area-P 1/2: 2.16 cm2
Est EF: >75
Height: 65 in
MV VTI: 1.48 cm2
S' Lateral: 2.1 cm
Weight: 3234.59 [oz_av]

## 2023-09-14 LAB — PHOSPHORUS: Phosphorus: 3.6 mg/dL (ref 2.5–4.6)

## 2023-09-14 MED ORDER — APIXABAN 5 MG PO TABS
5.0000 mg | ORAL_TABLET | Freq: Two times a day (BID) | ORAL | Status: DC
Start: 2023-09-14 — End: 2023-09-15
  Administered 2023-09-14 – 2023-09-15 (×3): 5 mg via ORAL
  Filled 2023-09-14 (×3): qty 1

## 2023-09-14 MED ORDER — FLECAINIDE ACETATE 50 MG PO TABS
50.0000 mg | ORAL_TABLET | Freq: Two times a day (BID) | ORAL | Status: DC
  Administered 2023-09-14: 50 mg via ORAL
  Filled 2023-09-14: qty 1

## 2023-09-14 MED ORDER — ACETAMINOPHEN 325 MG PO TABS
650.0000 mg | ORAL_TABLET | Freq: Four times a day (QID) | ORAL | Status: DC | PRN
  Administered 2023-09-14 – 2023-09-15 (×3): 650 mg via ORAL
  Filled 2023-09-14 (×3): qty 2

## 2023-09-14 MED ORDER — BRIMONIDINE TARTRATE 0.2 % OP SOLN
1.0000 [drp] | Freq: Three times a day (TID) | OPHTHALMIC | Status: DC
  Administered 2023-09-14 – 2023-09-15 (×3): 1 [drp] via OPHTHALMIC
  Filled 2023-09-14: qty 5

## 2023-09-14 MED ORDER — SODIUM CHLORIDE 0.9 % IV SOLN
1.0000 g | INTRAVENOUS | Status: DC
  Administered 2023-09-14 – 2023-09-15 (×2): 1 g via INTRAVENOUS
  Filled 2023-09-14 (×2): qty 10

## 2023-09-14 MED ORDER — BRIMONIDINE TARTRATE-TIMOLOL 0.2-0.5 % OP SOLN
1.0000 [drp] | Freq: Two times a day (BID) | OPHTHALMIC | Status: DC
  Filled 2023-09-14: qty 5

## 2023-09-14 MED ORDER — ASPIRIN 325 MG PO TBEC
325.0000 mg | DELAYED_RELEASE_TABLET | Freq: Once | ORAL | Status: AC
  Administered 2023-09-14: 325 mg via ORAL
  Filled 2023-09-14: qty 1

## 2023-09-14 MED ORDER — MELATONIN 5 MG PO TABS
5.0000 mg | ORAL_TABLET | Freq: Every evening | ORAL | Status: DC | PRN
  Administered 2023-09-14: 5 mg via ORAL
  Filled 2023-09-14: qty 1

## 2023-09-14 MED ORDER — TIMOLOL MALEATE 0.5 % OP SOLN
1.0000 [drp] | Freq: Two times a day (BID) | OPHTHALMIC | Status: DC
  Administered 2023-09-14 – 2023-09-15 (×3): 1 [drp] via OPHTHALMIC
  Filled 2023-09-14: qty 5

## 2023-09-14 MED ORDER — AMIODARONE HCL IN DEXTROSE 360-4.14 MG/200ML-% IV SOLN
30.0000 mg/h | INTRAVENOUS | Status: DC
  Administered 2023-09-14 – 2023-09-15 (×2): 30 mg/h via INTRAVENOUS
  Filled 2023-09-14 (×2): qty 200

## 2023-09-14 MED ORDER — ACETAMINOPHEN 325 MG PO TABS
650.0000 mg | ORAL_TABLET | ORAL | Status: AC
  Administered 2023-09-14: 650 mg via ORAL
  Filled 2023-09-14: qty 2

## 2023-09-14 MED ORDER — PROCHLORPERAZINE EDISYLATE 10 MG/2ML IJ SOLN
5.0000 mg | Freq: Four times a day (QID) | INTRAMUSCULAR | Status: DC | PRN
Start: 2023-09-14 — End: 2023-09-15

## 2023-09-14 MED ORDER — HEPARIN (PORCINE) 25000 UT/250ML-% IV SOLN
800.0000 [IU]/h | INTRAVENOUS | Status: DC
  Administered 2023-09-14: 800 [IU]/h via INTRAVENOUS
  Filled 2023-09-14: qty 250

## 2023-09-14 MED ORDER — NAPHAZOLINE-GLYCERIN 0.012-0.25 % OP SOLN: 1.0000 [drp] | Freq: Four times a day (QID) | OPHTHALMIC | Status: DC | PRN

## 2023-09-14 MED ORDER — AMIODARONE HCL IN DEXTROSE 360-4.14 MG/200ML-% IV SOLN
60.0000 mg/h | INTRAVENOUS | Status: AC
  Administered 2023-09-14: 60 mg/h via INTRAVENOUS
  Filled 2023-09-14: qty 200

## 2023-09-14 MED ORDER — CARBOXYMETHYLCELL-GLYCERIN PF 0.5-1 % OP SOLN: 1.0000 [drp] | Freq: Two times a day (BID) | OPHTHALMIC | Status: DC

## 2023-09-14 MED ORDER — MEMANTINE HCL 10 MG PO TABS
10.0000 mg | ORAL_TABLET | Freq: Two times a day (BID) | ORAL | Status: DC
  Administered 2023-09-14 – 2023-09-15 (×3): 10 mg via ORAL
  Filled 2023-09-14 (×3): qty 1

## 2023-09-14 MED ORDER — LEVOTHYROXINE SODIUM 100 MCG PO TABS
100.0000 ug | ORAL_TABLET | Freq: Every morning | ORAL | Status: DC
  Administered 2023-09-14 – 2023-09-15 (×2): 100 ug via ORAL
  Filled 2023-09-14 (×2): qty 1

## 2023-09-14 MED ORDER — POLYETHYLENE GLYCOL 3350 17 G PO PACK: 17.0000 g | PACK | Freq: Every day | ORAL | Status: DC | PRN

## 2023-09-14 NOTE — Plan of Care (Signed)
  Problem: Clinical Measurements: Goal: Ability to maintain clinical measurements within normal limits will improve Outcome: Progressing   Problem: Education: Goal: Knowledge of General Education information will improve Description: Including pain rating scale, medication(s)/side effects and non-pharmacologic comfort measures Outcome: Progressing   Problem: Safety: Goal: Ability to remain free from injury will improve Outcome: Progressing

## 2023-09-14 NOTE — Progress Notes (Signed)
 PHARMACY - ANTICOAGULATION CONSULT NOTE  Pharmacy Consult for Heparin  Indication: chest pain/ACS, hx afib  Vital Signs: Temp: 97.7 F (36.5 C) (04/05 0120) Temp Source: Oral (04/05 0120) BP: 134/62 (04/05 0120) Pulse Rate: 97 (04/05 0120)  Labs: Recent Labs    09/13/23 2041 09/13/23 2307  HGB 14.9  --   HCT 44.8  --   PLT 154  --   CREATININE 0.88  --   TROPONINIHS 82* 539*    Estimated Creatinine Clearance: 43.8 mL/min (by C-G formula based on SCr of 0.88 mg/dL).   Medical History: Past Medical History:  Diagnosis Date   Aortic stenosis 03/08/2016   Brady-tachy syndrome (HCC)    a. Biotronik dual chamber PPM implanted 2009 b. gen change to STJ dual chamber PPM 2017   Ejection fraction    EF 70%, echo, October, 2009  //   EF 55-60%, septal dyssynergy consistent with a paced rhythm, mild to moderate mitral regurgitation, echo, November, 2015    GERD (gastroesophageal reflux disease) 04/28/2014   Episodes November, 2015 with fluid refluxing from her esophagus.    Hair loss    Patient questioned Coumadin, changed toPradaxa   Hypertension    Hypothyroidism    Lower extremity edema 01/22/2018   Mitral regurgitation 05/03/2014   Mild-to-moderate mitral regurgitation, echo, November, 2015    Osteoarthritis of left knee 10/25/2017   Paroxysmal atrial fibrillation (HCC)    Episodes rapid atrial fib noted by pacemaker interrogation, August, 2011, diltiazem added, patient improved. Patient continues on Rythmol  //   Changed to flecainide 2013  //   flecainide level checked in 2013, good level    Persistent atrial fibrillation [facility])      Assessment: 89 y/o F with chest pain/elevated troponin to start heparin. On apixaban PTA for afib, last dose >12 hours ago, starting heparin now. Anticipate using aPTT to dose for now. Above labs reviewed.   Goal of Therapy:  Heparin level 0.3-0.7 units/ml aPTT 66-102 secs Monitor platelets by anticoagulation protocol: Yes   Plan:  Start  heparin drip at 800 units/hr Heparin level and aPTT in 8 hours Daily CBC/Heparin level/aPTT Monitor for bleeding  Abran Duke, PharmD, BCPS Clinical Pharmacist Phone: [phone]

## 2023-09-14 NOTE — Progress Notes (Signed)
 [facility] PROGRESS NOTE   [name] JYN:[id number] DOB: Oct 29, 1927 DOA: 09/13/2023  PCP: Venita Sheffield, MD  Brief History: 89 y.o. female with medical history significant for moderate to severe aortic stenosis, sick sinus syndrome status post pacemaker placement, paroxysmal A-fib on Eliquis, hypertension, hypothyroidism, who presents to the ER via EMS from assisted living facility with complaints of sudden onset chest pain after eating dinner.   Consultants: Cardiology  Procedures: None    Subjective/Interval History: Patient denies any chest pain this morning.  Daughter is at the bedside.  Patient denies any shortness of breath either.  No nausea or vomiting.    Assessment/Plan:  NSTEMI EKG changes seen previously as well and likely chronic. Troponin elevation noted to be significant. Cardiology has seen the patient.  They feel that this is demand ischemia from atrial fibrillation/RVR.  They recommend continuing Eliquis.  Patient without any chest symptoms currently. Echocardiogram has been ordered and is pending.  Patient received aspirin in the ED but not continued. LDL is 100.  Not noted to be on statin.  Has allergy listed to rosuvastatin.  Paroxysmal atrial fibrillation Continue with Eliquis. Has a pacemaker in situ. Seen by electrophysiology. Patient was on flecainide prior to admission.  Changed over to amiodarone by electrophysiology.  Presented with UTI Continue ceftriaxone.  Chronic diastolic CHF Stable.  Seems to be euvolemic.  Follow-up on echocardiogram  Hypothyroidism Continue home medications.  Obesity Estimated body mass index is 33.64 kg/m as calculated from the following:   Height as of an earlier encounter on 09/13/23: 5\' 5"  (1.651 m).   Weight as of this encounter: 91.7 kg.   DVT Prophylaxis: On apixaban Code Status: DNR Family Communication: Discussed with patient's daughter Disposition Plan: Hopefully back to ALF when  improved  Status is: Inpatient Remains inpatient appropriate because: NSTEMI, A-fib      Medications: Scheduled:  apixaban  5 mg Oral BID   brimonidine-timolol  1 drop Right Eye Q12H   Carboxymethylcell-Glycerin PF  1 drop Ophthalmic BID   levothyroxine  100 mcg Oral q morning   memantine  10 mg Oral BID   Continuous:  amiodarone 60 mg/hr (09/14/23 1019)   amiodarone     cefTRIAXone (ROCEPHIN)  IV Stopped (09/14/23 8657)   QIO:[id number], melatonin, polyethylene glycol, prochlorperazine  Antibiotics: Anti-infectives (From admission, onward)    Start     Dose/Rate Route Frequency Ordered Stop   09/14/23 0400  cefTRIAXone (ROCEPHIN) 1 g in sodium chloride 0.9 % 100 mL IVPB        1 g 200 mL/hr over 30 Minutes Intravenous Every 24 hours 09/14/23 0348         Objective:  Vital Signs  Vitals:   09/14/23 0120 09/14/23 0530 09/14/23 0547 09/14/23 0939  BP: 134/62 (!) 124/53  (!) 132/55  Pulse: 97 62  63  Resp: 18 17  17   Temp: 97.7 F (36.5 C)  97.9 F (36.6 C) 98.4 F (36.9 C)  TempSrc: Oral  Oral Oral  SpO2: 98% 97%  98%  Weight:        Intake/Output Summary (Last 24 hours) at 09/14/2023 1144 Last data filed at 09/14/2023 0102 Gross per 24 hour  Intake 601.21 ml  Output --  Net 601.21 ml   Filed Weights   09/13/23 1952  Weight: 91.7 kg    General appearance: Awake alert.  In no distress Resp: Clear to auscultation bilaterally.  Normal effort Cardio: S1-S2 is normal regular.  No S3-S4.  No  rubs murmurs or bruit GI: Abdomen is soft.  Nontender nondistended.  Bowel sounds are present normal.  No masses organomegaly Extremities: No edema.  Full range of motion of lower extremities. Neurologic: Alert and oriented x3.  No focal neurological deficits.    Lab Results:  Data Reviewed: I have personally reviewed following labs and reports of the imaging studies  CBC: Recent Labs  Lab 09/13/23 2041 09/14/23 0528  WBC 8.3 6.5  NEUTROABS 5.1  --   HGB  14.9 14.0  HCT 44.8 41.9  MCV 98.5 98.6  PLT 154 134*    Basic Metabolic Panel: Recent Labs  Lab 09/13/23 2041 09/14/23 0528  NA 141 141  K 4.0 3.8  CL 105 107  CO2 24 25  GLUCOSE 118* 106*  BUN 25* 27*  CREATININE 0.88 0.90  CALCIUM 9.2 8.9  MG  --  2.1  PHOS  --  3.6    GFR: Estimated Creatinine Clearance: 42.8 mL/min (by C-G formula based on SCr of 0.9 mg/dL).  Liver Function Tests: Recent Labs  Lab 09/13/23 2041  AST 63*  ALT 38  ALKPHOS 46  BILITOT 0.7  PROT 6.6  ALBUMIN 3.3*    Lipid Profile: Recent Labs    09/14/23 0528  CHOL 159  HDL 42  LDLCALC 100*  TRIG 85  CHOLHDL 3.8     Recent Results (from the past 240 hours)  Resp panel by RT-PCR (RSV, Flu A&B, Covid) Anterior Nasal Swab     Status: None   Collection Time: 09/13/23  8:41 PM   Specimen: Anterior Nasal Swab  Result Value Ref Range Status   SARS Coronavirus 2 by RT PCR NEGATIVE NEGATIVE Final   Influenza A by PCR NEGATIVE NEGATIVE Final   Influenza B by PCR NEGATIVE NEGATIVE Final    Comment: (NOTE) The Xpert Xpress SARS-CoV-2/FLU/RSV plus assay is intended as an aid in the diagnosis of influenza from Nasopharyngeal swab specimens and should not be used as a sole basis for treatment. Nasal washings and aspirates are unacceptable for Xpert Xpress SARS-CoV-2/FLU/RSV testing.  Fact Sheet for Patients: BloggerCourse.com  Fact Sheet for Healthcare Providers: SeriousBroker.it  This test is not yet approved or cleared by the [place] FDA and has been authorized for detection and/or diagnosis of SARS-CoV-2 by FDA under an Emergency Use Authorization (EUA). This EUA will remain in effect (meaning this test can be used) for the duration of the COVID-19 declaration under Section 564(b)(1) of the Act, 21 U.S.C. section 360bbb-3(b)(1), unless the authorization is terminated or revoked.     Resp Syncytial Virus by PCR NEGATIVE NEGATIVE  Final    Comment: (NOTE) Fact Sheet for Patients: BloggerCourse.com  Fact Sheet for Healthcare Providers: SeriousBroker.it  This test is not yet approved or cleared by the [place] FDA and has been authorized for detection and/or diagnosis of SARS-CoV-2 by FDA under an Emergency Use Authorization (EUA). This EUA will remain in effect (meaning this test can be used) for the duration of the COVID-19 declaration under Section 564(b)(1) of the Act, 21 U.S.C. section 360bbb-3(b)(1), unless the authorization is terminated or revoked.  Performed at [facility], [address], [place], [place] [address]       Radiology Studies: CT Head Wo Contrast Result Date: 09/13/2023 CLINICAL DATA:  Headache, new onset. EXAM: CT HEAD WITHOUT CONTRAST TECHNIQUE: Contiguous axial images were obtained from the base of the skull through the vertex without intravenous contrast. RADIATION DOSE REDUCTION: This exam was performed according  to the departmental dose-optimization program which includes automated exposure control, adjustment of the mA and/or kV according to patient size and/or use of iterative reconstruction technique. COMPARISON:  CT head without contrast 02/12/2022. FINDINGS: Brain: Moderate atrophy and white matter changes are similar to the prior exam. No acute infarct, hemorrhage, or mass lesion is present. The ventricles are proportionate to the degree of atrophy. No significant extraaxial fluid collection is present. The brainstem and cerebellum are within normal limits. Midline structures are within normal limits. Vascular: Atherosclerotic calcifications are present within the cavernous internal carotid arteries bilaterally. No hyperdense vessel is present. Skull: Calvarium is intact. No focal lytic or blastic lesions are present. No significant extracranial soft tissue lesion is present. Sinuses/Orbits: The left anterior paranasal sinuses  are opacified. The paranasal sinuses and mastoid air cells are otherwise clear. Bilateral lens replacements are noted. Globes and orbits are otherwise unremarkable. IMPRESSION: 1. No acute intracranial abnormality or significant interval change. 2. Moderate atrophy and white matter disease likely reflects the sequela of chronic microvascular ischemia. 3. Left anterior paranasal sinus disease. Electronically Signed   By: Marin Roberts M.D.   On: 09/13/2023 21:09   DG Chest Port 1 View Result Date: 09/13/2023 CLINICAL DATA:  Weakness EXAM: PORTABLE CHEST 1 VIEW COMPARISON:  08/21/2023 FINDINGS: Left-sided pacing device as before. Mild cardiomegaly. Bronchitic changes. Streaky atelectasis or scarring at the bases. Aortic atherosclerosis. No pneumothorax IMPRESSION: Mild cardiomegaly. Bronchitic changes. Streaky atelectasis or scarring at the bases. Electronically Signed   By: Jasmine Pang M.D.   On: 09/13/2023 21:01       LOS: 0 days   [name] Rito Ehrlich  [facility] Pager on [email or web address]  09/14/2023, 11:44 AM

## 2023-09-14 NOTE — Consult Note (Signed)
 Cardiology Consultation:   Patient ID: LELAND STASZEWSKI MRN: [id number]; DOB: 1928-04-02  Admit date: 09/13/2023 Date of Consult: 09/14/2023  Primary Care Provider: Venita Sheffield, MD Primary Cardiologist: Parke Poisson, MD  Primary Electrophysiologist:  [name], MD (Inactive)    Patient Profile:   [name] is a 89 y.o. female with a hx of moderate to severe aortic stenosis, severe LVH, sick sinus syndrome status post PPM dual-chamber, dementia, paroxysmal atrial fibrillation, hypertension, hypothyroidism who is being seen today for the evaluation of palpitations, chest pain, A-fib, and NSTEMI at the request of hospital medicine.  History of Present Illness:   [name] is a 89 y.o. female who has the above medical problems.  Note that she was in her usual state of health until earlier in the evening when she was at dinner.  She lives at an assisted living facility.  Noted that after dinner one of the nurses noted that she was short of breath and seemed to be having chest pain.  She does not corroborate this history very well given her dementia.  She was then sent to the emerged part for evaluation.  On arrival she was noted to be tachycardic with a wide QRS paced rhythm.  Subsequently she reverted back to a paced rhythm with heart rates in the 60s.  At that time her symptoms have subsided.  This is very similar to her hospitalization few years ago.  In this episode troponin was checked and noted with initial value of 82 and subsequent checks to be 539.  She is currently without any symptoms.     Past Medical History:  Diagnosis Date   Aortic stenosis 03/08/2016   Brady-tachy syndrome (HCC)    a. Biotronik dual chamber PPM implanted 2009 b. gen change to STJ dual chamber PPM 2017   Ejection fraction    EF 70%, echo, October, 2009  //   EF 55-60%, septal dyssynergy consistent with a paced rhythm, mild to moderate mitral regurgitation, echo, November, 2015    GERD  (gastroesophageal reflux disease) 04/28/2014   Episodes November, 2015 with fluid refluxing from her esophagus.    Hair loss    Patient questioned Coumadin, changed toPradaxa   Hypertension    Hypothyroidism    Lower extremity edema 01/22/2018   Mitral regurgitation 05/03/2014   Mild-to-moderate mitral regurgitation, echo, November, 2015    Osteoarthritis of left knee 10/25/2017   Paroxysmal atrial fibrillation (HCC)    Episodes rapid atrial fib noted by pacemaker interrogation, August, 2011, diltiazem added, patient improved. Patient continues on Rythmol  //   Changed to flecainide 2013  //   flecainide level checked in 2013, good level    Persistent atrial fibrillation (HCC)      Past Surgical History:  Procedure Laterality Date   ABDOMINAL HYSTERECTOMY     EP IMPLANTABLE DEVICE N/A 08/08/2015   Generator change with SJM Assurity DR PPM by Dr Johney Frame   JOINT REPLACEMENT     PACEMAKER PLACEMENT  2009         Home Medications:  Prior to Admission medications   Medication Sig Start Date End Date Taking? Authorizing Provider  brimonidine-timolol (COMBIGAN) 0.2-0.5 % ophthalmic solution Place 1 drop into the right eye every 12 (twelve) hours.   Yes [provider]  lidocaine (HM LIDOCAINE PATCH) 4 % Place 1 patch onto the skin daily. Apply topically to L shoulder in the morning   Yes [provider]  acetaminophen (TYLENOL) 500 MG tablet Take  1,000 mg by mouth in the morning, at noon, and at bedtime.   Yes [provider]  amLODipine (NORVASC) 2.5 MG tablet Take 1 tablet (2.5 mg total) by mouth daily. 08/21/23  Yes Medina-Vargas, Monina C, NP  Apoaequorin (PREVAGEN) 10 MG CAPS Take 10 mg by mouth daily. 11/13/22  Yes [name], MD  betamethasone, augmented, (DIPROLENE) 0.05 % gel Apply topically daily.  Apply to scalp topically every 12 hours as needed for psoriasis related to PSORIASIS, UNSPECIFIED (L40.9) apply to affected area of scalp 11/13/22  Yes [name], MD  Calcium Carbonate-Vit D-Min (CALCIUM 600+D PLUS MINERALS) 600-400 MG-UNIT TABS Take 1 tablet by mouth daily. 11/13/22  Yes [name], MD  Carboxymethylcell-Glycerin PF (REFRESH RELIEVA PF) 0.5-1 % SOLN Apply 1 drop to eye in the morning and at bedtime. for relieve discomfort 11/13/22  Yes [name], MD  diclofenac Sodium (VOLTAREN) 1 % GEL Apply 1 g topically 2 (two) times daily as needed (to L foot bunion deformity for pain).    [provider]  ELIQUIS 5 MG TABS tablet TAKE ONE TABLET BY MOUTH TWICE A DAY 03/18/23  Yes Venita Sheffield, MD  flecainide (TAMBOCOR) 50 MG tablet Take 1 tablet (50 mg total) by mouth 2 (two) times daily. 11/13/22  Yes [name], MD  furosemide (LASIX) 20 MG tablet TAKE ONE TABLET BY MOUTH EVERY DAY 03/19/23  Yes Venita Sheffield, MD  hydrocortisone 2.5 % ointment as needed (rash). 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    [provider]  levothyroxine (SYNTHROID) 100 MCG tablet Take 1 tablet (100 mcg total) by mouth every morning. 11/13/22  Yes [name], MD  Multiple Vitamin (MULTIVITAMIN ADULT) TABS Take 1 tablet by mouth daily.   Yes [provider]  Multiple Vitamins-Minerals (OCUVITE PO) Take 1 capsule by mouth daily.   Yes [provider]  NAMENDA 10 MG tablet TAKE ONE TABLET BY MOUTH TWICE A DAY FOR COGNITIVE DECLINE 04/23/23  Yes Venita Sheffield, MD  nystatin cream (MYCOSTATIN) Apply 1 Application topically every 12 (twelve) hours as needed (for rash).    [provider]  pantoprazole (PROTONIX) 40 MG tablet Take 40 mg by mouth daily as needed (for indigestion).    [provider]  potassium chloride (KLOR-CON M) 10 MEQ tablet Take 1 tablet (10 mEq total) by mouth daily. 06/28/23  Yes Venita Sheffield, MD  sodium chloride (MURO 128) 5 % ophthalmic ointment Place 1 Application into the right eye at bedtime.   Yes [provider]  sodium chloride (MURO 128) 5 % ophthalmic solution Place 1 drop into both eyes in the morning and at bedtime. for decrease swelling Wait 1 hour between these and Refresh Patient taking differently: Place 1 drop into the right eye in the morning and at bedtime. for decrease swelling Wait 1 hour between these and Refresh 07/10/22  Yes [name], MD    Inpatient Medications: Scheduled Meds:  Continuous Infusions:  heparin 800 Units/hr (09/14/23 0307)   PRN Meds: acetaminophen, melatonin, polyethylene glycol, prochlorperazine   Social History:   Social History   Socioeconomic History   Marital status: Widowed    Spouse name: Not on file  Number of children: Not on file   Years of education: Not on file   Highest education level: Not on file  Occupational History   Not on file  Tobacco Use   Smoking status: Former    Current packs/day: 0.00    Types: Cigarettes    Quit date: 09/06/1990    Years since quitting: 33.0   Smokeless tobacco: Never  Vaping Use   Vaping status: Never Used  Substance and Sexual Activity   Alcohol use: No   Drug use: No   Sexual activity: Not on file  Other Topics Concern   Not on file  Social History Narrative   Not on file   Social Drivers of Health   Financial Resource Strain: Not on file  Food Insecurity: No Food Insecurity (02/15/2022)   Hunger Vital Sign    Worried About Running Out of Food in the Last Year: Never true    Ran Out of Food in the Last Year: Never true  Transportation Needs: No Transportation Needs (02/15/2022)   PRAPARE - Administrator, Civil Service (Medical): No    Lack of Transportation (Non-Medical): No  Physical Activity: Not  on file  Stress: Not on file  Social Connections: Not on file  Intimate Partner Violence: Not At Risk (02/15/2022)   Humiliation, Afraid, Rape, and Kick questionnaire    Fear of Current or Ex-Partner: No    Emotionally Abused: No    Physically Abused: No    Sexually Abused: No    Family History:    Family History  Problem Relation Age of Onset   Heart disease Father    Coronary artery disease Other    Heart disease Son      Review of Systems: All other review of systems are negative unless otherwise noted in the HPI as above.   Physical Exam/Data:   Vitals:   09/13/23 1951 09/13/23 1952 09/14/23 0100 09/14/23 0120  BP: 113/64   134/62  Pulse: (!) 114  62 97  Resp: 17  14 18   Temp: 98.2 F (36.8 C)   97.7 F (36.5 C)  TempSrc:    Oral  SpO2: 97%  97% 98%  Weight:  91.7 kg      Intake/Output Summary (Last 24 hours) at 09/14/2023 0345 Last data filed at 09/14/2023 0059 Gross per 24 hour  Intake 501.21 ml  Output --  Net 501.21 ml   Filed Weights   09/13/23 1952  Weight: 91.7 kg   Body mass index is 33.64 kg/m.  General appearance: alert and cooperative Lungs: clear to auscultation bilaterally Heart: regular rate and rhythm, S1, S2 normal, no murmur, click, rub or gallop Abdomen: soft, non-tender; bowel sounds normal; no masses,  no organomegaly Extremities: extremities normal, atraumatic, no cyanosis or edema Pulses: 2+ and symmetric Skin: Skin color, texture, turgor normal. No rashes or lesions Neurologic: Grossly normal  EKG:  The EKG was personally reviewed and demonstrates:  paced rhythm  Telemetry:  Telemetry was personally reviewed and demonstrates:  paced rhythm  Relevant CV Studies: Echo 09/2022: IMPRESSIONS     1. Left ventricular ejection fraction, by estimation, is 70 to 75%. The  left ventricle has hyperdynamic function. The left ventricle has no  regional wall motion abnormalities. There is severe left ventricular  hypertrophy of the septal  segment. Left  ventricular diastolic parameters are consistent with Grade I diastolic  dysfunction (impaired relaxation). The average left ventricular global  longitudinal strain is -15.1 %. The global  longitudinal strain is normal.   2. Right ventricular systolic function is normal. The right ventricular  size is normal. There is normal pulmonary artery systolic pressure. The  estimated right ventricular systolic pressure is 34.4 mmHg.   3. The mitral valve is normal in structure. Mild mitral valve  regurgitation. No evidence of mitral stenosis. Severe mitral annular  calcification.   4. The aortic valve is tricuspid. There is severe calcifcation of the  aortic valve. There is severe thickening of the aortic valve. Aortic valve  regurgitation is not visualized. Moderate to severe aortic valve stenosis.  Aortic valve area, by VTI  measures 0.99 cm. Aortic valve mean gradient measures 30.0 mmHg. Aortic  valve Vmax measures 3.58 m/s.   5. The inferior vena cava is normal in size with greater than 50%  respiratory variability, suggesting right atrial pressure of 3 mmHg.   Laboratory Data:  Chemistry Recent Labs  Lab 09/13/23 2041  NA 141  K 4.0  CL 105  CO2 24  GLUCOSE 118*  BUN 25*  CREATININE 0.88  CALCIUM 9.2  GFRNONAA >60  ANIONGAP 12    Recent Labs  Lab 09/13/23 2041  PROT 6.6  ALBUMIN 3.3*  AST 63*  ALT 38  ALKPHOS 46  BILITOT 0.7   Hematology Recent Labs  Lab 09/13/23 2041  WBC 8.3  RBC 4.55  HGB 14.9  HCT 44.8  MCV 98.5  MCH 32.7  MCHC 33.3  RDW 13.2  PLT 154   Cardiac EnzymesNo results for input(s): "TROPONINI" in the last 168 hours. No results for input(s): "TROPIPOC" in the last 168 hours.  BNP Recent Labs  Lab 09/13/23 2041  BNP 376.4*    DDimer No results for input(s): "DDIMER" in the last 168 hours.  Radiology/Studies:  CT Head Wo Contrast Result Date: 09/13/2023 CLINICAL DATA:  Headache, new onset. EXAM: CT HEAD WITHOUT CONTRAST  TECHNIQUE: Contiguous axial images were obtained from the base of the skull through the vertex without intravenous contrast. RADIATION DOSE REDUCTION: This exam was performed according to the departmental dose-optimization program which includes automated exposure control, adjustment of the mA and/or kV according to patient size and/or use of iterative reconstruction technique. COMPARISON:  CT head without contrast 02/12/2022. FINDINGS: Brain: Moderate atrophy and white matter changes are similar to the prior exam. No acute infarct, hemorrhage, or mass lesion is present. The ventricles are proportionate to the degree of atrophy. No significant extraaxial fluid collection is present. The brainstem and cerebellum are within normal limits. Midline structures are within normal limits. Vascular: Atherosclerotic calcifications are present within the cavernous internal carotid arteries bilaterally. No hyperdense vessel is present. Skull: Calvarium is intact. No focal lytic or blastic lesions are present. No significant extracranial soft tissue lesion is present. Sinuses/Orbits: The left anterior paranasal sinuses are opacified. The paranasal sinuses and mastoid air cells are otherwise clear. Bilateral lens replacements are noted. Globes and orbits are otherwise unremarkable. IMPRESSION: 1. No acute intracranial abnormality or significant interval change. 2. Moderate atrophy and white matter disease likely reflects the sequela of chronic microvascular ischemia. 3. Left anterior paranasal sinus disease. Electronically Signed   By: Marin Roberts M.D.   On: 09/13/2023 21:09   DG Chest Port 1 View Result Date: 09/13/2023 CLINICAL DATA:  Weakness EXAM: PORTABLE CHEST 1 VIEW COMPARISON:  08/21/2023 FINDINGS: Left-sided pacing device as before. Mild cardiomegaly. Bronchitic changes. Streaky atelectasis or scarring at the bases. Aortic atherosclerosis. No pneumothorax IMPRESSION: Mild cardiomegaly. Bronchitic changes.  Streaky atelectasis or scarring  at the bases. Electronically Signed   By: Jasmine Pang M.D.   On: 09/13/2023 21:01    Assessment and Plan:   Chest pain and NSTEMI Atrial fibrillation This almost certainly is a type II myocardial injury from A-fib with RVR and her ventricle being paced in the 110 range.  She has known moderate to severe aortic stenosis and known severe LVH with very little functional reserve if she were to go tachycardic for prolonged periods of time.  On presentation when she was symptomatic she was having a paced rhythm that was tachycardic.  When she reverted back to atrial paced rhythm in the 60s she no longer had symptoms.  Thus I do not think that this needs to be treated as a true type I NSTEMI.  I would not anticoagulate systemically with IV heparin for this NSTEMI for that reason.  She can remain on systemic anticoagulation with Eliquis for her atrial fibrillation.  She was planned to have a repeat echo at early May, would go ahead and have that done during his observation.  We can also have EP weigh in on whether or not any of her medications need to be adjusted.  Recommendations: - Continue Eliquis - Continue flecainide 50 mg twice a day        For questions or updates, please contact [facility] Please consult [email or web address] for contact info under     Signed, Joellen Jersey, MD  09/14/2023 3:45 AM

## 2023-09-14 NOTE — ED Notes (Signed)
 Nurse informed provider about pt new troop lab value

## 2023-09-14 NOTE — Consult Note (Signed)
 Electrophysiology consultation   Patient ID: [name] MRN: [id number]; DOB: [birth year]  Admit date: 09/13/2023 Date of Consult: 09/14/2023  PCP:  Venita Sheffield, MD   History of Present Illness:   [name] is a 89 year old woman who I am seeing today for an evaluation of atrial fibrillation with rapid ventricular rates at the request of [name].  The patient was previously followed by Dr. Johney Frame in the EP clinic.  She has a history of tachycardia-bradycardia syndrome with a dual-chamber permanent pacemaker in place.  She has a history of aortic stenosis, GERD, mitral regurgitation, hypertension and paroxysmal atrial fibrillation.  She has been managed on flecainide for many years.  Cardiology was asked to see [name] this admission.  She presented to the hospital with shortness of breath and chest pain.  She was tachycardic upon arrival to the hospital.  Rates were greater than 100 bpm.  [name] troponins were elevated.  She is with [name] today in the emergency department.  She tells me that [name] chest discomfort.  She is sitting in a recliner.  No shortness of breath.   Past medical, surgical, social and family history reviewed.  ROS:  Please see the history of present illness.  All other ROS reviewed and negative.     Physical Exam/Data:   Vitals:   09/14/23 0100 09/14/23 0120 09/14/23 0530 09/14/23 0547  BP:  134/62 (!) 124/53   Pulse: 62 97 62   Resp: 14 18 17    Temp:  97.7 F (36.5 C)  97.9 F (36.6 C)  TempSrc:  Oral  Oral  SpO2: 97% 98% 97%   Weight:        General: Elderly Cardiac: 2 out of 6 crescendo decrescendo systolic ejection murmur.  CIED pocket well-healed Lungs:  clear to auscultation bilaterally, no wheezing, rhonchi or rales  Psych:  Normal affect   EKG:   September 14, 2023 EKG shows sinus rhythm with a first-degree AV delay.  ST/T wave changes in the lateral precordium and inferior leads  September 13, 2023 EKG shows wide-complex tachycardia,  ventricular rate 114 bpm.  Regular rhythm.  September 14, 2023 device interrogation personally reviewed Battery longevity 3.2 years Lead parameter stable Atrial pacing 39% Ventricular pacing less than 1% Atrial high rate episodes beginning at 621 on April 4.  Review of EGM's show atrial flutter with a cycle length approximately 280 ms.  Atrial sensing was intermittent during the arrhythmia.  Ventricular sensing during the atrial arrhythmia with ventricular rates around 520 - 550 ms.  At times, the atrial flutter degenerated into atrial fibrillation.   Assessment and Plan:   #Atrial fibrillation with rapid ventricular rates Presented with a rapidly conducted atrial arrhythmia (atrial flutter and atrial fibrillation) with ventricular rates greater than 100 bpm.  She has been on flecainide in the past.  I do not think this is the best option for [name] moving forward especially with the troponin elevation and likelihood of some degree of coronary disease.  I would recommend stopping flecainide and transitioning to amiodarone.  Okay to use IV amiodarone for the next 24 hours without a bolus.  Plan to transition to oral amiodarone tomorrow.  I discussed amiodarone and the need for close monitoring with the patient's family at the bedside today.  #Permanent pacemaker in situ #History of tachycardia-bradycardia syndrome I reached out to [facility] for device interrogation and have reviewed.  Tentative discharge tomorrow if rhythm remains stable.  Sheria Lang T. Lalla Brothers, MD, [facility], [facility] Cardiac  Electrophysiology -

## 2023-09-14 NOTE — Progress Notes (Signed)
  Echocardiogram 2D Echocardiogram has been performed.  [name] 09/14/2023, 5:04 PM

## 2023-09-15 ENCOUNTER — Other Ambulatory Visit: Payer: Self-pay

## 2023-09-15 ENCOUNTER — Encounter (HOSPITAL_COMMUNITY): Payer: Self-pay | Admitting: Internal Medicine

## 2023-09-15 DIAGNOSIS — Z95811 Presence of heart assist device: Secondary | ICD-10-CM

## 2023-09-15 DIAGNOSIS — I214 Non-ST elevation (NSTEMI) myocardial infarction: Secondary | ICD-10-CM

## 2023-09-15 DIAGNOSIS — I4819 Other persistent atrial fibrillation: Secondary | ICD-10-CM

## 2023-09-15 LAB — CBC
HCT: 42.4 % (ref 36.0–46.0)
Hemoglobin: 14 g/dL (ref 12.0–15.0)
MCH: 32.3 pg (ref 26.0–34.0)
MCHC: 33 g/dL (ref 30.0–36.0)
MCV: 97.9 fL (ref 80.0–100.0)
Platelets: 106 10*3/uL — ABNORMAL LOW (ref 150–400)
RBC: 4.33 MIL/uL (ref 3.87–5.11)
RDW: 13.3 % (ref 11.5–15.5)
WBC: 5.9 10*3/uL (ref 4.0–10.5)
nRBC: 0 % (ref 0.0–0.2)

## 2023-09-15 LAB — BASIC METABOLIC PANEL WITH GFR
Anion gap: 12 (ref 5–15)
BUN: 24 mg/dL — ABNORMAL HIGH (ref 8–23)
CO2: 20 mmol/L — ABNORMAL LOW (ref 22–32)
Calcium: 9 mg/dL (ref 8.9–10.3)
Chloride: 107 mmol/L (ref 98–111)
Creatinine, Ser: 0.86 mg/dL (ref 0.44–1.00)
GFR, Estimated: >60 mL/min (ref >60)
Glucose, Bld: 115 mg/dL — ABNORMAL HIGH (ref 70–99)
Potassium: 4 mmol/L (ref 3.5–5.1)
Sodium: 139 mmol/L (ref 135–145)

## 2023-09-15 LAB — MAGNESIUM: Magnesium: 2 mg/dL (ref 1.7–2.4)

## 2023-09-15 LAB — TROPONIN I (HIGH SENSITIVITY): Troponin I (High Sensitivity): 1005 ng/L (ref <18)

## 2023-09-15 MED ORDER — AMIODARONE HCL 200 MG PO TABS
400.0000 mg | ORAL_TABLET | Freq: Every day | ORAL | Status: DC
  Administered 2023-09-15: 400 mg via ORAL
  Filled 2023-09-15: qty 2

## 2023-09-15 MED ORDER — AMIODARONE HCL 200 MG PO TABS: ORAL_TABLET | ORAL | 1 refills | Status: DC

## 2023-09-15 MED ORDER — AMIODARONE HCL 200 MG PO TABS: 400.0000 mg | ORAL_TABLET | Freq: Every day | ORAL | Status: DC

## 2023-09-15 MED ORDER — AMIODARONE HCL 200 MG PO TABS: 200.0000 mg | ORAL_TABLET | Freq: Every day | ORAL | Status: DC

## 2023-09-15 NOTE — TOC CM/SW Note (Signed)
 Transition of Care [facility]) - Discharge Note   Patient Details  Name: [name] MRN: [id number] Date of Birth: 10-27-1927  Transition of Care [facility]) CM/SW Contact:  Ralene Bathe, LCSW Phone Number: 09/15/2023, 10:12 AM   Clinical Narrative:    Patient will DC to: [facility] Anticipated DC date: 09/15/2023 Family notified: Yes, daughter [name] Transport by: Family  Per MD patient ready for DC to ALF. RN to call report prior to discharge to [facility] [phone] ex. [phone]).   CSW spoke with patient's RN at [facility], [place].  [facility] reports that the patient can return when ready.   RNCM is following up on home health recommendations.    LCSW contacted patient's daughter, [name], and family will transport patient back to ALF.   CSW will sign off for now as social work intervention is no longer needed. Please consult [place] again if new needs arise.    Final next level of care: Assisted Living Barriers to Discharge: No Barriers Identified   Patient Goals and CMS Choice            Discharge Placement                Patient to be transferred to facility by: Daughter [name] Name of family member notified: Daughter [name] Patient and family notified of of transfer: 09/15/23  Discharge Plan and Services Additional resources added to the After Visit Summary for                                       Social Drivers of Health (SDOH) Interventions SDOH Screenings   Food Insecurity: No Food Insecurity (09/14/2023)  Housing: Low Risk  (09/14/2023)  Transportation Needs: No Transportation Needs (09/14/2023)  Utilities: Not At Risk (09/14/2023)  Depression (PHQ2-9): Low Risk  (04/09/2023)  Social Connections: Moderately Integrated (09/14/2023)  Tobacco Use: Medium Risk (09/13/2023)     Readmission Risk Interventions     No data to display

## 2023-09-15 NOTE — TOC Transition Note (Signed)
 Transition of Care [facility]) - Discharge Note   Patient Details  Name: [name] MRN: [id number] Date of Birth: 1928-05-01  [facility]) CM/SW Contact:  Ronny Bacon, RN Phone Number: 09/15/2023, 10:49 AM   Clinical Narrative:   Patient with [facility] recommendations from PT. Referral faxed to Benjaman Kindler with [employer] for [facility] PT as they provide therapy services to patient Assisted Living Facility.     Final next level of care: Assisted Living Barriers to Discharge: No Barriers Identified   Patient Goals and CMS Choice            Discharge Placement                Patient to be transferred to facility by: Daughter Clydie Braun Name of family member notified: Daughter Clydie Braun Patient and family notified of of transfer: 09/15/23  Discharge Plan and Services Additional resources added to the After Visit Summary for                                       Social Drivers of Health (SDOH) Interventions SDOH Screenings   Food Insecurity: No Food Insecurity (09/14/2023)  Housing: Low Risk  (09/14/2023)  Transportation Needs: No Transportation Needs (09/14/2023)  Utilities: Not At Risk (09/14/2023)  Depression (PHQ2-9): Low Risk  (04/09/2023)  Social Connections: Moderately Integrated (09/14/2023)  Tobacco Use: Medium Risk (09/13/2023)     Readmission Risk Interventions     No data to display

## 2023-09-15 NOTE — Discharge Summary (Addendum)
 [facility]  Physician Discharge Summary   Patient ID: [name] MRN: [id number] DOB/AGE: 88-Oct-1929 88 y.o.  Admit date: 09/13/2023 Discharge date:   09/15/2023   PCP: [name] Sheffield, MD  DISCHARGE DIAGNOSES:    NSTEMI (non-ST elevated myocardial infarction) [facility]) Atrial fibrillation, paroxysmal Chronic diastolic CHF Hypothyroidism   RECOMMENDATIONS FOR OUTPATIENT FOLLOW UP: Cardiology will schedule outpatient follow-up. CBC and basic metabolic panel in 1 week   Home Health: Home health PT Equipment/Devices: None  CODE STATUS: DNR  DISCHARGE CONDITION: fair  Diet recommendation: Heart healthy  INITIAL HISTORY: 89 y.o. female with medical history significant for moderate to severe aortic stenosis, sick sinus syndrome status post pacemaker placement, paroxysmal A-fib on Eliquis, hypertension, hypothyroidism, who presents to the ER via EMS from assisted living facility with complaints of sudden onset chest pain after eating dinner.    Consultants: Cardiology   Procedures: Echocardiogram  HOSPITAL COURSE:   NSTEMI EKG changes seen previously as well and likely chronic. Troponin elevation noted to be significant. Cardiology has seen the patient.  They feel that this is demand ischemia from atrial fibrillation/RVR.  They recommend continuing Eliquis.  Patient without any chest symptoms currently. Echocardiogram reveals a normal LVEF with grade 2 diastolic dysfunction.  Moderate Aortic valve stenosis noted.   LDL is 100.  Has intolerance to statins.  Can be pursued in the outpatient setting.   Paroxysmal atrial fibrillation Continue with Eliquis. Has a pacemaker in situ. Seen by electrophysiology. Patient was on flecainide prior to admission.  Changed over to amiodarone by electrophysiology.   Presented with UTI Unclear if she was truly symptomatic.  She was treated with ceftriaxone in the hospital.  No need to continue at discharge.   Chronic  diastolic CHF Stable.  Seems to be euvolemic.     Hypothyroidism Continue home medications.  Slightly low platelet counts noted.  This can be rechecked in the outpatient setting.   Obesity Estimated body mass index is 33.64 kg/m as calculated from the following:   Height as of an earlier encounter on 09/13/23: 5\' 5"  (1.651 m).   Weight as of this encounter: 91.7 kg.  Patient stable.  Okay for discharge back to ALF.  She has been cleared by cardiology.   PERTINENT LABS:  The results of significant diagnostics from this hospitalization (including imaging, microbiology, ancillary and laboratory) are listed below for reference.    Microbiology: Recent Results (from the past 240 hours)  Resp panel by RT-PCR (RSV, Flu A&B, Covid) Anterior Nasal Swab     Status: None   Collection Time: 09/13/23  8:41 PM   Specimen: Anterior Nasal Swab  Result Value Ref Range Status   SARS Coronavirus 2 by RT PCR NEGATIVE NEGATIVE Final   Influenza A by PCR NEGATIVE NEGATIVE Final   Influenza B by PCR NEGATIVE NEGATIVE Final    Comment: (NOTE) The Xpert Xpress SARS-CoV-2/FLU/RSV plus assay is intended as an aid in the diagnosis of influenza from Nasopharyngeal swab specimens and should not be used as a sole basis for treatment. Nasal washings and aspirates are unacceptable for Xpert Xpress SARS-CoV-2/FLU/RSV testing.  Fact Sheet for Patients: BloggerCourse.com  Fact Sheet for Healthcare Providers: SeriousBroker.it  This test is not yet approved or cleared by the [place] FDA and has been authorized for detection and/or diagnosis of SARS-CoV-2 by FDA under an Emergency Use Authorization (EUA). This EUA will remain in effect (meaning this test can be used) for the duration of the COVID-19 declaration under Section 564(b)(1)  of the Act, 21 U.S.C. section 360bbb-3(b)(1), unless the authorization is terminated or revoked.     Resp Syncytial  Virus by PCR NEGATIVE NEGATIVE Final    Comment: (NOTE) Fact Sheet for Patients: BloggerCourse.com  Fact Sheet for Healthcare Providers: SeriousBroker.it  This test is not yet approved or cleared by the [place] FDA and has been authorized for detection and/or diagnosis of SARS-CoV-2 by FDA under an Emergency Use Authorization (EUA). This EUA will remain in effect (meaning this test can be used) for the duration of the COVID-19 declaration under Section 564(b)(1) of the Act, 21 U.S.C. section 360bbb-3(b)(1), unless the authorization is terminated or revoked.  Performed at [facility], [address], [place], [place] [address]      Labs:   Basic Metabolic Panel: Recent Labs  Lab 09/13/23 2041 09/14/23 0528 09/15/23 0226  NA 141 141 139  K 4.0 3.8 4.0  CL 105 107 107  CO2 24 25 20*  GLUCOSE 118* 106* 115*  BUN 25* 27* 24*  CREATININE 0.88 0.90 0.86  CALCIUM 9.2 8.9 9.0  MG  --  2.1 2.0  PHOS  --  3.6  --    Liver Function Tests: Recent Labs  Lab 09/13/23 2041  AST 63*  ALT 38  ALKPHOS 46  BILITOT 0.7  PROT 6.6  ALBUMIN 3.3*    CBC: Recent Labs  Lab 09/13/23 2041 09/14/23 0528 09/15/23 0226  WBC 8.3 6.5 5.9  NEUTROABS 5.1  --   --   HGB 14.9 14.0 14.0  HCT 44.8 41.9 42.4  MCV 98.5 98.6 97.9  PLT 154 134* 106*     IMAGING STUDIES ECHOCARDIOGRAM COMPLETE Result Date: 09/14/2023    ECHOCARDIOGRAM REPORT   Patient Name:   [name] Date of Exam: 09/14/2023 Medical Rec #:  [id number]    Height:       65.0 in Accession #:    [id number]   Weight:       202.2 lb Date of Birth:  03/28/1928    BSA:          1.987 m Patient Age:    89 years     BP:           101/49 mmHg Patient Gender: F            HR:           63 bpm. Exam Location:  Inpatient Procedure: 2D Echo (Both Spectral and Color Flow Doppler were utilized during            procedure). Indications:    NSTEMI  History:        Patient has prior  history of Echocardiogram examinations, most                 recent 09/17/2022. CHF, Pacemaker, PAD, Aortic Valve Disease,                 Signs/Symptoms:Chest Pain; Risk Factors:Hypertension.  Sonographer:    [name] RDCS Referring Phys: [id number] [name]  Sonographer Comments: Global longitudinal strain was attempted. IMPRESSIONS  1. Turbulent flow through LV with near cavity obliteration during systole . Left ventricular ejection fraction, by estimation, is >75%. The left ventricle has hyperdynamic function. The left ventricle has no regional wall motion abnormalities. There is severe left ventricular hypertrophy. Left ventricular diastolic parameters are consistent with Grade II diastolic dysfunction (pseudonormalization). Elevated left atrial pressure.  2. Right ventricular systolic function is normal. The right  ventricular size is normal. There is normal pulmonary artery systolic pressure.  3. Left atrial size was moderately dilated.  4. Mild mitral valve regurgitation.  5. AV is thickened, calcified with restricted motion. Peak and mean gradients through te valve are 62 and 35 mm Hg respectively AVA (VTI) is o.74 cm2. Dimensionless index is 0.33 consistent with moderate AS. Calculation limited by accuracy of LVOT measurements 2 D imaging, AS appears moderate to severe. Compared to echo report from April 2024, mean gradient is mildly increased (30 to 35 mm Hg) and DVI is decreassed (0.33 to 0.3). Marland Kitchen The aortic valve is tricuspid. Aortic valve regurgitation is not visualized.  6. The inferior vena cava is normal in size with greater than 50% respiratory variability, suggesting right atrial pressure of 3 mmHg. FINDINGS  Left Ventricle: Turbulent flow through LV with near cavity obliteration during systole. Left ventricular ejection fraction, by estimation, is >75%. The left ventricle has hyperdynamic function. The left ventricle has no regional wall motion abnormalities. Global longitudinal strain  performed but not reported based on interpreter judgement due to suboptimal tracking. The left ventricular internal cavity size was small. There is severe left ventricular hypertrophy. Left ventricular diastolic parameters are consistent with Grade II diastolic dysfunction (pseudonormalization). Elevated left atrial pressure. Right Ventricle: The right ventricular size is normal. Right vetricular wall thickness was not assessed. Right ventricular systolic function is normal. There is normal pulmonary artery systolic pressure. The tricuspid regurgitant velocity is 2.10 m/s, and with an assumed right atrial pressure of 3 mmHg, the estimated right ventricular systolic pressure is 20.6 mmHg. Left Atrium: Left atrial size was moderately dilated. Right Atrium: Right atrial size was normal in size. Pericardium: There is no evidence of pericardial effusion. Mitral Valve: There is mild thickening of the mitral valve leaflet(s). Mild to moderate mitral annular calcification. Mild mitral valve regurgitation. MV peak gradient, 8.1 mmHg. The mean mitral valve gradient is 4.0 mmHg. Tricuspid Valve: The tricuspid valve is normal in structure. Tricuspid valve regurgitation is mild. Aortic Valve: AV is thickened, calcified with restricted motion. Peak and mean gradients through te valve are 62 and 35 mm Hg respectively AVA (VTI) is o.74 cm2. Dimensionless index is 0.33 consistent with moderate AS. Calculation limited by accuracy of LVOT measurements 2 D imaging, AS appears moderate to severe. Compared to echo report from April 2024, mean gradient is mildly increased (30 to 35 mm Hg) and DVI is decreassed (0.33 to 0.3). The aortic valve is tricuspid. Aortic valve regurgitation is not visualized. Aortic valve mean gradient measures 35.0 mmHg. Aortic valve peak gradient measures 61.8 mmHg. Aortic valve area, by VTI measures 0.74 cm. Pulmonic Valve: The pulmonic valve was grossly normal. Pulmonic valve regurgitation is not visualized.  Aorta: The aortic root and ascending aorta are structurally normal, with no evidence of dilitation. Venous: The inferior vena cava is normal in size with greater than 50% respiratory variability, suggesting right atrial pressure of 3 mmHg. IAS/Shunts: No atrial level shunt detected by color flow Doppler.  LEFT VENTRICLE PLAX 2D LVIDd:         3.90 cm   Diastology LVIDs:         2.10 cm   LV e' medial:    3.26 cm/s LV PW:         1.20 cm   LV E/e' medial:  35.6 LV IVS:        1.50 cm   LV e' lateral:   4.35 cm/s LVOT diam:  1.70 cm   LV E/e' lateral: 26.7 LV SV:         70 LV SV Index:   35 LVOT Area:     2.27 cm  RIGHT VENTRICLE            IVC RV Basal diam:  2.70 cm    IVC diam: 1.70 cm RV S prime:     8.16 cm/s TAPSE (M-mode): 2.1 cm LEFT ATRIUM             Index        RIGHT ATRIUM           Index LA diam:        3.50 cm 1.76 cm/m   RA Area:     14.10 cm LA Vol (A2C):   96.2 ml 48.41 ml/m  RA Volume:   35.20 ml  17.71 ml/m LA Vol (A4C):   74.8 ml 37.64 ml/m LA Biplane Vol: 85.3 ml 42.92 ml/m  AORTIC VALVE AV Area (Vmax):    0.83 cm AV Area (Vmean):   0.75 cm AV Area (VTI):     0.74 cm AV Vmax:           393.00 cm/s AV Vmean:          278.000 cm/s AV VTI:            0.947 m AV Peak Grad:      61.8 mmHg AV Mean Grad:      35.0 mmHg LVOT Vmax:         143.00 cm/s LVOT Vmean:        91.350 cm/s LVOT VTI:          0.308 m LVOT/AV VTI ratio: 0.33  AORTA Ao Root diam: 3.00 cm Ao Asc diam:  3.00 cm MITRAL VALVE                TRICUSPID VALVE MV Area (PHT): 2.16 cm     TR Peak grad:   17.6 mmHg MV Area VTI:   1.48 cm     TR Vmax:        210.00 cm/s MV Peak grad:  8.1 mmHg MV Mean grad:  4.0 mmHg     SHUNTS MV Vmax:       1.42 m/s     Systemic VTI:  0.31 m MV Vmean:      94.4 cm/s    Systemic Diam: 1.70 cm MV Decel Time: 352 msec MV E velocity: 116.00 cm/s MV A velocity: 119.00 cm/s MV E/A ratio:  0.97 Dietrich Pates MD Electronically signed by Dietrich Pates MD Signature Date/Time: 09/14/2023/9:58:23 PM    Final     CT Head Wo Contrast Result Date: 09/13/2023 CLINICAL DATA:  Headache, new onset. EXAM: CT HEAD WITHOUT CONTRAST TECHNIQUE: Contiguous axial images were obtained from the base of the skull through the vertex without intravenous contrast. RADIATION DOSE REDUCTION: This exam was performed according to the departmental dose-optimization program which includes automated exposure control, adjustment of the mA and/or kV according to patient size and/or use of iterative reconstruction technique. COMPARISON:  CT head without contrast 02/12/2022. FINDINGS: Brain: Moderate atrophy and white matter changes are similar to the prior exam. No acute infarct, hemorrhage, or mass lesion is present. The ventricles are proportionate to the degree of atrophy. No significant extraaxial fluid collection is present. The brainstem and cerebellum are within normal limits. Midline structures are within normal limits. Vascular: Atherosclerotic calcifications are present within the cavernous internal carotid arteries  bilaterally. No hyperdense vessel is present. Skull: Calvarium is intact. No focal lytic or blastic lesions are present. No significant extracranial soft tissue lesion is present. Sinuses/Orbits: The left anterior paranasal sinuses are opacified. The paranasal sinuses and mastoid air cells are otherwise clear. Bilateral lens replacements are noted. Globes and orbits are otherwise unremarkable. IMPRESSION: 1. No acute intracranial abnormality or significant interval change. 2. Moderate atrophy and white matter disease likely reflects the sequela of chronic microvascular ischemia. 3. Left anterior paranasal sinus disease. Electronically Signed   By: Marin Roberts M.D.   On: 09/13/2023 21:09   DG Chest Port 1 View Result Date: 09/13/2023 CLINICAL DATA:  Weakness EXAM: PORTABLE CHEST 1 VIEW COMPARISON:  08/21/2023 FINDINGS: Left-sided pacing device as before. Mild cardiomegaly. Bronchitic changes. Streaky atelectasis or  scarring at the bases. Aortic atherosclerosis. No pneumothorax IMPRESSION: Mild cardiomegaly. Bronchitic changes. Streaky atelectasis or scarring at the bases. Electronically Signed   By: Jasmine Pang M.D.   On: 09/13/2023 21:01   DG Chest 2 View Result Date: 08/21/2023 CLINICAL DATA:  Chest pain and shortness of breath EXAM: CHEST - 2 VIEW COMPARISON:  Chest x-Domagala 02/12/2022 FINDINGS: Left-sided pacemaker is again seen. The heart is enlarged. There is no focal lung infiltrate, pleural effusion or pneumothorax. No acute fractures are seen. There is chronic compression deformity of a midthoracic vertebral body. IMPRESSION: Cardiomegaly. No acute cardiopulmonary process. Electronically Signed   By: Darliss Cheney M.D.   On: 08/21/2023 01:55    DISCHARGE EXAMINATION: Vitals:   09/14/23 1938 09/14/23 2330 09/15/23 0326 09/15/23 0756  BP: (!) 136/57 119/81 (!) 142/47 126/61  Pulse: 61  62 62  Resp: 20 14 19    Temp: 97.9 F (36.6 C) 98.6 F (37 C) 97.9 F (36.6 C) 97.7 F (36.5 C)  TempSrc: Oral Oral Oral Oral  SpO2: 98% 97% 98%   Weight:   87.9 kg    General appearance: Awake alert.  In no distress Resp: Clear to auscultation bilaterally.  Normal effort Cardio: S1-S2 is normal regular.  No S3-S4.  No rubs murmurs or bruit GI: Abdomen is soft.  Nontender nondistended.  Bowel sounds are present normal.  No masses organomegaly    DISPOSITION: ALF  Discharge Instructions     Call MD for:  difficulty breathing, headache or visual disturbances   Complete by: As directed    Call MD for:  extreme fatigue   Complete by: As directed    Call MD for:  persistant dizziness or light-headedness   Complete by: As directed    Call MD for:  persistant nausea and vomiting   Complete by: As directed    Call MD for:  severe uncontrolled pain   Complete by: As directed    Call MD for:  temperature >100.4   Complete by: As directed    Diet - low sodium heart healthy   Complete by: As directed     Discharge instructions   Complete by: As directed    Please take your medications as prescribed.  Cardiology will arrange outpatient follow-up.  You were cared for by a hospitalist during your hospital stay. If you have any questions about your discharge medications or the care you received while you were in the hospital after you are discharged, you can call the unit and asked to speak with the hospitalist on call if the hospitalist that took care of you is not available. Once you are discharged, your primary care physician will handle any further medical issues.  Please note that NO REFILLS for any discharge medications will be authorized once you are discharged, as it is imperative that you return to your primary care physician (or establish a relationship with a primary care physician if you do not have one) for your aftercare needs so that they can reassess your need for medications and monitor your lab values. If you do not have a primary care physician, you can call [phone] for a physician referral.   Increase activity slowly   Complete by: As directed               Medication List     STOP taking these medications    amLODipine 2.5 MG tablet Commonly known as: NORVASC   flecainide 50 MG tablet Commonly known as: TAMBOCOR   hydrocortisone 2.5 % ointment       TAKE these medications    acetaminophen 500 MG tablet Commonly known as: TYLENOL Take 1,000 mg by mouth in the morning, at noon, and at bedtime.   amiodarone 200 MG tablet Commonly known as: PACERONE Take 400mg  (2 tablets) once daily for 9 days and then 200mg  once daily till changed by cardiologist.   betamethasone (augmented) 0.05 % gel Commonly known as: DIPROLENE Apply topically daily.  Apply to scalp topically every 12 hours as needed for psoriasis related to PSORIASIS, UNSPECIFIED (L40.9) apply to affected area of scalp   Calcium 600+D Plus Minerals 600-400 MG-UNIT Tabs Take 1 tablet by mouth daily.    Combigan 0.2-0.5 % ophthalmic solution Generic drug: brimonidine-timolol Place 1 drop into the right eye every 12 (twelve) hours.   Eliquis 5 MG Tabs tablet Generic drug: apixaban TAKE ONE TABLET BY MOUTH TWICE A DAY   furosemide 20 MG tablet Commonly known as: LASIX TAKE ONE TABLET BY MOUTH EVERY DAY   HM Lidocaine Patch 4 % Generic drug: lidocaine Place 1 patch onto the skin daily. Apply topically to L shoulder in the morning   Ipratropium-Albuterol 20-100 MCG/ACT Aers respimat Commonly known as: COMBIVENT Inhale 1 puff into the lungs every 6 (six) hours as needed for shortness of breath.   levothyroxine 100 MCG tablet Commonly known as: SYNTHROID Take 1 tablet (100 mcg total) by mouth every morning.   Multivitamin Adult Tabs Take 1 tablet by mouth daily.   Namenda 10 MG tablet Generic drug: memantine TAKE ONE TABLET BY MOUTH TWICE A DAY FOR COGNITIVE DECLINE   nystatin cream Commonly known as: MYCOSTATIN Apply 1 Application topically every 12 (twelve) hours as needed (for rash).   OCUVITE PO Take 1 capsule by mouth daily.   pantoprazole 40 MG tablet Commonly known as: PROTONIX Take 40 mg by mouth daily as needed (for indigestion).   potassium chloride 10 MEQ tablet Commonly known as: KLOR-CON M Take 1 tablet (10 mEq total) by mouth daily.   Prevagen 10 MG Caps Generic drug: Apoaequorin Take 10 mg by mouth daily.   Refresh Relieva PF 0.5-1 % Soln Generic drug: Carboxymethylcell-Glycerin PF Apply 1 drop to eye in the morning and at bedtime. for relieve discomfort   sodium chloride 5 % ophthalmic ointment Commonly known as: MURO 128 Place 1 Application into the right eye at bedtime. What changed: Another medication with the same name was changed. Make sure you understand how and when to take each.   sodium chloride 5 % ophthalmic solution Commonly known as: Muro 128 Place 1 drop into both eyes in the morning and at bedtime. for decrease swelling Wait 1  hour between  these and Refresh What changed: how to take this   Voltaren 1 % Gel Generic drug: diclofenac Sodium Apply 1 g topically 2 (two) times daily as needed (to L foot bunion deformity for pain).           TOTAL DISCHARGE TIME: 35 minutes  Mishal Probert [employer] on [email or web address]  09/15/2023, 8:38 AM

## 2023-09-15 NOTE — Progress Notes (Signed)
   Rounding Note    Patient Name: [name] Date of Encounter: 09/15/2023  [facility] Cardiologist: Parke Poisson, MD   Subjective   \Doing well this morning.  No complaints.  Family at bedside.  Vital Signs    Vitals:   09/14/23 1938 09/14/23 2330 09/15/23 0326 09/15/23 0756  BP: (!) 136/57 119/81 (!) 142/47 126/61  Pulse: 61  62 62  Resp: 20 14 19    Temp: 97.9 F (36.6 C) 98.6 F (37 C) 97.9 F (36.6 C) 97.7 F (36.5 C)  TempSrc: Oral Oral Oral Oral  SpO2: 98% 97% 98%   Weight:   87.9 kg     Intake/Output Summary (Last 24 hours) at 09/15/2023 0826 Last data filed at 09/15/2023 0326 Gross per 24 hour  Intake 376.08 ml  Output 0 ml  Net 376.08 ml      09/15/2023    3:26 AM 09/13/2023    7:52 PM 09/13/2023   11:16 AM  Last 3 Weights  Weight (lbs) 193 lb 12.6 oz 202 lb 2.6 oz 190 lb  Weight (kg) 87.9 kg 91.7 kg 86.183 kg      Telemetry    Sinus rhythm- Personally Reviewed  ECG    Personally Reviewed  Physical Exam   GEN: No acute distress.   Cardiac: RRR, no murmurs, rubs, or gallops.  Respiratory: Clear to auscultation bilaterally. Psych: Normal affect   Assessment & Plan    #Atrial fibrillation In sinus rhythm on amiodarone. Transition to oral amiodarone.  Taper ordered.  Will need follow-up in 6 to 8 weeks.  #Permanent pacemaker in situ #History of tachycardia-bradycardia syndrome Device functioning appropriately.  Okay to discharge from a cardiovascular perspective.    Sheria Lang T. Lalla Brothers, MD, [facility], [facility] Cardiac Electrophysiology

## 2023-09-15 NOTE — Evaluation (Signed)
 Physical Therapy Evaluation Patient Details Name: [name] MRN: [id number] DOB: 16-Jan-1928 Today's Date: 09/15/2023  History of Present Illness  89 y.o. female presents to [facility] from ALF with sudden onset of chest pain, SOB, and generalized weakness. Pt with a-fiib and a-flutter and NSTEMI. PMHx: moderate to severe aortic stenosis, sick sinus syndrome status post pacemaker placement, paroxysmal A-fib on Eliquis, hypertension, hypothyroidism   Clinical Impression  Pt in bed upon arrival and agreeable to PT eval. PTA, pt was ModI with a RW or an upright RW for short distances. Pt would occasionally have assist from ALF staff to bath and would walk to the dining room for meals. In today's session, pt required supervision to stand and ambulate ~120 ft with a RW. Pt reports being close to baseline for mobility. Pt currently with functional limitations due to the deficits listed below (see PT Problem List). Pt would benefit from acute skilled PT to address functional impairments. Recommending post-acute HHPT to work towards independence with mobility. Acute PT to follow.         If plan is discharge home, recommend the following: A little help with walking and/or transfers;A little help with bathing/dressing/bathroom;Assist for transportation;Help with stairs or ramp for entrance   Can travel by private vehicle    Yes    Equipment Recommendations None recommended by PT     Functional Status Assessment Patient has had a recent decline in their functional status and demonstrates the ability to make significant improvements in function in a reasonable and predictable amount of time.     Precautions / Restrictions Precautions Precautions: Fall Restrictions Weight Bearing Restrictions Per Provider Order: No      Mobility  Bed Mobility Overal bed mobility: Modified Independent    General bed mobility comments: HOB elevated    Transfers Overall transfer level: Needs assistance Equipment  used: Rolling walker (2 wheels) Transfers: Sit to/from Stand Sit to Stand: Supervision  General transfer comment: supervision for safety    Ambulation/Gait Ambulation/Gait assistance: Supervision Gait Distance (Feet): 120 Feet Assistive device: Rolling walker (2 wheels) Gait Pattern/deviations: Step-through pattern, Decreased stride length, Narrow base of support Gait velocity: decr     General Gait Details: short and steady steps with no overt LOB    Balance Overall balance assessment: Needs assistance, Mild deficits observed, not formally tested, History of Falls Sitting-balance support: No upper extremity supported, Feet supported Sitting balance-Leahy Scale: Good     Standing balance support: Bilateral upper extremity supported, Reliant on assistive device for balance, During functional activity Standing balance-Leahy Scale: Poor Standing balance comment: reliant on RW       Pertinent Vitals/Pain Pain Assessment Pain Assessment: No/denies pain    Home Living Family/patient expects to be discharged to:: Assisted living     Home Equipment: Agricultural consultant (2 wheels);Other (comment) (upright RW)      Prior Function Prior Level of Function : Needs assist    Mobility Comments: ModI with RW in the community, upright RW in the ALF ADLs Comments: occasional assist with bathing. Gets meals from dining hall     Extremity/Trunk Assessment   Upper Extremity Assessment Upper Extremity Assessment: Defer to OT evaluation (reports torn L shoulder rotator cuff muscles)    Lower Extremity Assessment Lower Extremity Assessment: Overall WFL for tasks assessed    Cervical / Trunk Assessment Cervical / Trunk Assessment: Normal  Communication   Communication Communication: No apparent difficulties    Cognition Arousal: Alert Behavior During Therapy: [facility] for tasks assessed/performed  PT - Cognitive impairments: No family/caregiver present to determine baseline, Problem  solving, Memory    PT - Cognition Comments: unable to remember room number or how to navigate back to room Following commands: Intact       Cueing Cueing Techniques: Verbal cues     General Comments General comments (skin integrity, edema, etc.): 65-68 BPM throughout session, other VSS on RA     PT Assessment Patient needs continued PT services  PT Problem List Decreased activity tolerance;Decreased balance;Decreased mobility       PT Treatment Interventions Gait training;DME instruction;Functional mobility training;Therapeutic activities;Therapeutic exercise;Balance training;Neuromuscular re-education;Patient/family education    PT Goals (Current goals can be found in the Care Plan section)  Acute Rehab PT Goals Patient Stated Goal: to go home PT Goal Formulation: With patient/family Time For Goal Achievement: 09/29/23 Potential to Achieve Goals: Good    Frequency Min 1X/week        AM-PAC PT "6 Clicks" Mobility  Outcome Measure Help needed turning from your back to your side while in a flat bed without using bedrails?: None Help needed moving from lying on your back to sitting on the side of a flat bed without using bedrails?: None Help needed moving to and from a bed to a chair (including a wheelchair)?: A Little Help needed standing up from a chair using your arms (e.g., wheelchair or bedside chair)?: A Little Help needed to walk in hospital room?: A Little Help needed climbing 3-5 steps with a railing? : A Lot 6 Click Score: 19    End of Session Equipment Utilized During Treatment: Gait belt Activity Tolerance: Patient tolerated treatment well Patient left: with call bell/phone within reach;in chair;with family/visitor present Nurse Communication: Mobility status PT Visit Diagnosis: Other abnormalities of gait and mobility (R26.89)    Time: 3329-5188 PT Time Calculation (min) (ACUTE ONLY): 21 min   Charges:   PT Evaluation $PT Eval Low Complexity: 1 Low    PT General Charges $$ ACUTE PT VISIT: 1 Visit        Hilton Cork, PT, DPT Secure Chat Preferred  Rehab Office [phone]   Arturo Morton Brion Aliment 09/15/2023, 10:31 AM

## 2023-09-15 NOTE — Progress Notes (Signed)
 RN attempted report to Chanell.

## 2023-09-16 ENCOUNTER — Non-Acute Institutional Stay: Payer: Self-pay | Admitting: Sports Medicine

## 2023-09-16 ENCOUNTER — Encounter: Payer: Self-pay | Admitting: Sports Medicine

## 2023-09-16 DIAGNOSIS — I10 Essential (primary) hypertension: Secondary | ICD-10-CM

## 2023-09-16 DIAGNOSIS — H40051 Ocular hypertension, right eye: Secondary | ICD-10-CM | POA: Diagnosis not present

## 2023-09-16 DIAGNOSIS — E039 Hypothyroidism, unspecified: Secondary | ICD-10-CM

## 2023-09-16 DIAGNOSIS — H18513 Endothelial corneal dystrophy, bilateral: Secondary | ICD-10-CM | POA: Diagnosis not present

## 2023-09-16 DIAGNOSIS — H02032 Senile entropion of right lower eyelid: Secondary | ICD-10-CM | POA: Diagnosis not present

## 2023-09-16 DIAGNOSIS — Z09 Encounter for follow-up examination after completed treatment for conditions other than malignant neoplasm: Secondary | ICD-10-CM | POA: Diagnosis not present

## 2023-09-16 DIAGNOSIS — I480 Paroxysmal atrial fibrillation: Secondary | ICD-10-CM

## 2023-09-16 DIAGNOSIS — N3000 Acute cystitis without hematuria: Secondary | ICD-10-CM

## 2023-09-16 DIAGNOSIS — I214 Non-ST elevation (NSTEMI) myocardial infarction: Secondary | ICD-10-CM | POA: Diagnosis not present

## 2023-09-16 LAB — URINE CULTURE: Culture: >=100000 — AB

## 2023-09-16 NOTE — Progress Notes (Signed)
 Provider:  Dr. Venita Sheffield Location:  [facility] Place of Service:   Assisted living   PCP: Venita Sheffield, MD Patient Care Team: Venita Sheffield, MD as PCP - General (Internal Medicine) [name], MD (Inactive) as PCP - Electrophysiology (Cardiology) Parke Poisson, MD as PCP - Cardiology (Cardiology) Lurline Idol, FNP [facility])  Extended Emergency Contact Information Primary Emergency Contact: [facility] Phone: [phone] Mobile Phone: [phone] Relation: Daughter Interpreter needed? No Secondary Emergency Contact: Pearman,Karen Address: [address]          [place], [place] [address] [place] Home Phone: [phone] Mobile Phone: [phone] Relation: Daughter  Goals of Care: Advanced Directive information    09/14/2023    1:00 PM  Advanced Directives  Does Patient Have a Medical Advance Directive? Yes  Type of Advance Directive Healthcare Power of Attorney  Does patient want to make changes to medical advance directive? No - Patient declined  Copy of Healthcare Power of Attorney in Chart? No - copy requested      No chief complaint on file.   Discussed the use of AI scribe software for clinical note transcription with the patient, who gave verbal consent to proceed.  History of Present Illness   89 year old female with a past medical history of attic stenosis, sick sinus syndrome, paroxysmal A-fib on Eliquis, hypertension, hypothyroidism is evaluated today for hospital follow-up visit. Patient was hospitalized for chest pain.  She had NSTEMI with troponin leg.  She was evaluated by cardiology and told troponin leak was secondary to demand ischemia from rapid ventricular response from A-fib.  Flecainide is changed to amiodarone and recommended cardiology follow-up as outpatient. Patient seen and examined in her room today.  She seems pleasant and comfortable and does not  appear to be in distress. She had her morning breakfast today and is watching TV in her room. Patient denies chest pain, palpitations, shortness of breath, dizzy, lightheadedness. Patient completed antibiotics for UTI.  Currently denies dysuria, lower abdominal pain, frequency, nausea, vomiting.    Past Medical History:  Diagnosis Date   Aortic stenosis 03/08/2016   Brady-tachy syndrome (HCC)    a. Biotronik dual chamber PPM implanted 2009 b. gen change to STJ dual chamber PPM 2017   Ejection fraction    EF 70%, echo, October, 2009  //   EF 55-60%, septal dyssynergy consistent with a paced rhythm, mild to moderate mitral regurgitation, echo, November, 2015    GERD (gastroesophageal reflux disease) 04/28/2014   Episodes November, 2015 with fluid refluxing from her esophagus.    Hair loss    Patient questioned Coumadin, changed toPradaxa   Hypertension    Hypothyroidism    Lower extremity edema 01/22/2018   Mitral regurgitation 05/03/2014   Mild-to-moderate mitral regurgitation, echo, November, 2015    Osteoarthritis of left knee 10/25/2017   Paroxysmal atrial fibrillation (HCC)    Episodes rapid atrial fib noted by pacemaker interrogation, August, 2011, diltiazem added, patient improved. Patient continues on Rythmol  //   Changed to flecainide 2013  //   flecainide level checked in 2013, good level    Persistent atrial fibrillation (HCC)    Past Surgical History:  Procedure Laterality Date   ABDOMINAL HYSTERECTOMY     EP IMPLANTABLE DEVICE N/A 08/08/2015   Generator change with [employer] DR PPM by Dr Johney Frame   JOINT REPLACEMENT     PACEMAKER PLACEMENT  2009        reports that she quit smoking  about 33 years ago. Her smoking use included cigarettes. She has never used smokeless tobacco. She reports that she does not drink alcohol and does not use drugs. Social History   Socioeconomic History   Marital status: Widowed    Spouse name: Not on file   Number of children: Not on  file   Years of education: Not on file   Highest education level: Not on file  Occupational History   Not on file  Tobacco Use   Smoking status: Former    Current packs/day: 0.00    Types: Cigarettes    Quit date: 09/06/1990    Years since quitting: 33.0   Smokeless tobacco: Never  Vaping Use   Vaping status: Never Used  Substance and Sexual Activity   Alcohol use: No   Drug use: No   Sexual activity: Not on file  Other Topics Concern   Not on file  Social History Narrative   Not on file   Social Drivers of Health   Financial Resource Strain: Not on file  Food Insecurity: No Food Insecurity (09/14/2023)   Hunger Vital Sign    Worried About Running Out of Food in the Last Year: Never true    Ran Out of Food in the Last Year: Never true  Transportation Needs: No Transportation Needs (09/14/2023)   PRAPARE - Administrator, Civil Service (Medical): No    Lack of Transportation (Non-Medical): No  Physical Activity: Not on file  Stress: Not on file  Social Connections: Moderately Integrated (09/14/2023)   Social Connection and Isolation Panel [NHANES]    Frequency of Communication with Friends and Family: Three times a week    Frequency of Social Gatherings with Friends and Family: Three times a week    Attends Religious Services: 1 to 4 times per year    Active Member of Clubs or Organizations: No    Attends Banker Meetings: 1 to 4 times per year    Marital Status: Widowed  Intimate Partner Violence: Not At Risk (09/14/2023)   Humiliation, Afraid, Rape, and Kick questionnaire    Fear of Current or Ex-Partner: No    Emotionally Abused: No    Physically Abused: No    Sexually Abused: No    Functional Status Survey:    Family History  Problem Relation Age of Onset   Heart disease Father    Coronary artery disease Other    Heart disease Son     Health Maintenance  Topic Date Due   COVID-19 Vaccine (3 - Pfizer risk series) 08/08/2023   INFLUENZA  VACCINE  01/10/2024   Medicare Annual Wellness (AWV)  04/08/2024   DTaP/Tdap/Td (3 - Td or Tdap) 04/10/2033   Pneumonia Vaccine 30+ Years old  Completed   DEXA SCAN  Completed   Zoster Vaccines- Shingrix  Completed   HPV VACCINES  Aged Out    Allergies  Allergen Reactions   Morphine And Codeine Other (See Comments)    Patient states that she went "crazy" with this   Penicillins Swelling and Rash        Lasix [Furosemide] Itching    Patient stated it made her itch all over and dry. Not listed on the [facility]   Aspirin     Pacemaker   Clindamycin/Lincomycin Other (See Comments)    Any meds with mycin in the name Unknown reaction Not listed on the [facility]   Crestor [Rosuvastatin]     Other reaction(s): muscle pain Not listed  on the [facility]   Diltiazem     Skin discoloration, lower extremity swelling Not listed on the [facility]   Erythromycin Other (See Comments)    Any meds with mycin in the name Unknown reaction Not listed on the [facility]   Lisinopril Other (See Comments)    REACTION: Cough   Metronidazole     Other reaction(s): mouth sores, tongue swelling   Morphine    Penicillins Cross Reactors Other (See Comments)    Any meds with mycin in the name Unknown reaction    Outpatient Encounter Medications as of 09/16/2023  Medication Sig   acetaminophen (TYLENOL) 500 MG tablet Take 1,000 mg by mouth in the morning, at noon, and at bedtime.   amiodarone (PACERONE) 200 MG tablet Take 400mg  (2 tablets) once daily for 9 days and then 200mg  once daily till changed by cardiologist.   Apoaequorin (PREVAGEN) 10 MG CAPS Take 10 mg by mouth daily.   betamethasone, augmented, (DIPROLENE) 0.05 % gel Apply topically daily.  Apply to scalp topically every 12 hours as needed for psoriasis related to PSORIASIS, UNSPECIFIED (L40.9) apply to affected area of scalp   brimonidine-timolol (COMBIGAN) 0.2-0.5 % ophthalmic solution Place 1 drop into the right eye every 12 (twelve) hours.   Calcium Carbonate-Vit D-Min  (CALCIUM 600+D PLUS MINERALS) 600-400 MG-UNIT TABS Take 1 tablet by mouth daily.   Carboxymethylcell-Glycerin PF (REFRESH RELIEVA PF) 0.5-1 % SOLN Apply 1 drop to eye in the morning and at bedtime. for relieve discomfort   diclofenac Sodium (VOLTAREN) 1 % GEL Apply 1 g topically 2 (two) times daily as needed (to L foot bunion deformity for pain).   ELIQUIS 5 MG TABS tablet TAKE ONE TABLET BY MOUTH TWICE A DAY   furosemide (LASIX) 20 MG tablet TAKE ONE TABLET BY MOUTH EVERY DAY   Ipratropium-Albuterol (COMBIVENT) 20-100 MCG/ACT AERS respimat Inhale 1 puff into the lungs every 6 (six) hours as needed for shortness of breath.   levothyroxine (SYNTHROID) 100 MCG tablet Take 1 tablet (100 mcg total) by mouth every morning.   lidocaine (HM LIDOCAINE PATCH) 4 % Place 1 patch onto the skin daily. Apply topically to L shoulder in the morning   Multiple Vitamin (MULTIVITAMIN ADULT) TABS Take 1 tablet by mouth daily.   Multiple Vitamins-Minerals (OCUVITE PO) Take 1 capsule by mouth daily.   NAMENDA 10 MG tablet TAKE ONE TABLET BY MOUTH TWICE A DAY FOR COGNITIVE DECLINE   nystatin cream (MYCOSTATIN) Apply 1 Application topically every 12 (twelve) hours as needed (for rash).   pantoprazole (PROTONIX) 40 MG tablet Take 40 mg by mouth daily as needed (for indigestion).   potassium chloride (KLOR-CON M) 10 MEQ tablet Take 1 tablet (10 mEq total) by mouth daily.   sodium chloride (MURO 128) 5 % ophthalmic ointment Place 1 Application into the right eye at bedtime.   sodium chloride (MURO 128) 5 % ophthalmic solution Place 1 drop into both eyes in the morning and at bedtime. for decrease swelling Wait 1 hour between these and Refresh (Patient taking differently: Place 1 drop into the right eye in the morning and at bedtime. for decrease swelling Wait 1 hour between these and Refresh)   No facility-administered encounter medications on file as of 09/16/2023.    Review of Systems  Constitutional:  Negative for chills  and fever.  HENT:  Negative for sinus pressure and sore throat.   Respiratory:  Negative for cough, shortness of breath and wheezing.   Cardiovascular:  Negative for  chest pain, palpitations and leg swelling.  Gastrointestinal:  Negative for abdominal distention, abdominal pain, blood in stool, constipation, diarrhea, nausea and vomiting.  Genitourinary:  Negative for dysuria and frequency.  Neurological:  Negative for dizziness and numbness.   Negative unless indicated in HPI.  There were no vitals filed for this visit. There is no height or weight on file to calculate BMI. BP Readings from Last 3 Encounters:  09/15/23 (!) 155/89  09/13/23 136/68  08/21/23 132/60   Wt Readings from Last 3 Encounters:  09/15/23 193 lb 12.6 oz (87.9 kg)  09/13/23 190 lb (86.2 kg)  08/21/23 198 lb 3.2 oz (89.9 kg)   Physical Exam Constitutional:      Appearance: Normal appearance.  HENT:     Head: Normocephalic and atraumatic.  Cardiovascular:     Rate and Rhythm: Normal rate and regular rhythm.     Heart sounds: Murmur heard.  Pulmonary:     Effort: Pulmonary effort is normal. No respiratory distress.     Breath sounds: Normal breath sounds. No wheezing.  Abdominal:     General: Bowel sounds are normal. There is no distension.     Tenderness: There is no abdominal tenderness. There is no guarding or rebound.     Comments:    Musculoskeletal:        General: No swelling or tenderness.  Neurological:     Mental Status: She is alert. Mental status is at baseline.     Motor: No weakness.     Labs reviewed: Basic Metabolic Panel: Recent Labs    09/13/23 2041 09/14/23 0528 09/15/23 0226  NA 141 141 139  K 4.0 3.8 4.0  CL 105 107 107  CO2 24 25 20*  GLUCOSE 118* 106* 115*  BUN 25* 27* 24*  CREATININE 0.88 0.90 0.86  CALCIUM 9.2 8.9 9.0  MG  --  2.1 2.0  PHOS  --  3.6  --    Liver Function Tests: Recent Labs    01/26/23 0000 06/20/23 0000 09/13/23 2041  AST 24 29 63*  ALT  17 15 38  ALKPHOS 40 48 46  BILITOT  --   --  0.7  PROT  --   --  6.6  ALBUMIN 3.7 3.6 3.3*   No results for input(s): "LIPASE", "AMYLASE" in the last 8760 hours. No results for input(s): "AMMONIA" in the last 8760 hours. CBC: Recent Labs    05/24/23 0000 06/20/23 0000 08/21/23 0010 09/13/23 2041 09/14/23 0528 09/15/23 0226  WBC 7.6 4.6   < > 8.3 6.5 5.9  NEUTROABS 4,355.00 2,019.00  --  5.1  --   --   HGB 13.5 13.3   < > 14.9 14.0 14.0  HCT 41 41   < > 44.8 41.9 42.4  MCV  --   --    < > 98.5 98.6 97.9  PLT 136* 92*   < > 154 134* 106*   < > = values in this interval not displayed.   Cardiac Enzymes: No results for input(s): "CKTOTAL", "CKMB", "CKMBINDEX", "TROPONINI" in the last 8760 hours. BNP: Invalid input(s): "POCBNP" No results found for: "HGBA1C" Lab Results  Component Value Date   TSH 4.38 01/26/2023   No results found for: "VITAMINB12" No results found for: "FOLATE" No results found for: "IRON", "TIBC", "FERRITIN"  Imaging and Procedures obtained prior to SNF admission: ECHOCARDIOGRAM COMPLETE Result Date: 09/14/2023    ECHOCARDIOGRAM REPORT   Patient Name:   Andretta B Malanga Date of Exam:  09/14/2023 Medical Rec #:  [id number]    Height:       65.0 in Accession #:    [id number]   Weight:       202.2 lb Date of Birth:  12-28-1927    BSA:          1.987 m Patient Age:    89 years     BP:           101/49 mmHg Patient Gender: F            HR:           63 bpm. Exam Location:  Inpatient Procedure: 2D Echo (Both Spectral and Color Flow Doppler were utilized during            procedure). Indications:    NSTEMI  History:        Patient has prior history of Echocardiogram examinations, most                 recent 09/17/2022. CHF, Pacemaker, PAD, Aortic Valve Disease,                 Signs/Symptoms:Chest Pain; Risk Factors:Hypertension.  Sonographer:    Delcie Roch RDCS Referring Phys: [id number] Oliver Pila HALL  Sonographer Comments: Global longitudinal strain was attempted.  IMPRESSIONS  1. Turbulent flow through LV with near cavity obliteration during systole . Left ventricular ejection fraction, by estimation, is >75%. The left ventricle has hyperdynamic function. The left ventricle has no regional wall motion abnormalities. There is severe left ventricular hypertrophy. Left ventricular diastolic parameters are consistent with Grade II diastolic dysfunction (pseudonormalization). Elevated left atrial pressure.  2. Right ventricular systolic function is normal. The right ventricular size is normal. There is normal pulmonary artery systolic pressure.  3. Left atrial size was moderately dilated.  4. Mild mitral valve regurgitation.  5. AV is thickened, calcified with restricted motion. Peak and mean gradients through te valve are 62 and 35 mm Hg respectively AVA (VTI) is o.74 cm2. Dimensionless index is 0.33 consistent with moderate AS. Calculation limited by accuracy of LVOT measurements 2 D imaging, AS appears moderate to severe. Compared to echo report from April 2024, mean gradient is mildly increased (30 to 35 mm Hg) and DVI is decreassed (0.33 to 0.3). Marland Kitchen The aortic valve is tricuspid. Aortic valve regurgitation is not visualized.  6. The inferior vena cava is normal in size with greater than 50% respiratory variability, suggesting right atrial pressure of 3 mmHg. FINDINGS  Left Ventricle: Turbulent flow through LV with near cavity obliteration during systole. Left ventricular ejection fraction, by estimation, is >75%. The left ventricle has hyperdynamic function. The left ventricle has no regional wall motion abnormalities. Global longitudinal strain performed but not reported based on interpreter judgement due to suboptimal tracking. The left ventricular internal cavity size was small. There is severe left ventricular hypertrophy. Left ventricular diastolic parameters are consistent with Grade II diastolic dysfunction (pseudonormalization). Elevated left atrial pressure. Right  Ventricle: The right ventricular size is normal. Right vetricular wall thickness was not assessed. Right ventricular systolic function is normal. There is normal pulmonary artery systolic pressure. The tricuspid regurgitant velocity is 2.10 m/s, and with an assumed right atrial pressure of 3 mmHg, the estimated right ventricular systolic pressure is 20.6 mmHg. Left Atrium: Left atrial size was moderately dilated. Right Atrium: Right atrial size was normal in size. Pericardium: There is no evidence of pericardial effusion. Mitral Valve: There is mild thickening of the mitral valve leaflet(s).  Mild to moderate mitral annular calcification. Mild mitral valve regurgitation. MV peak gradient, 8.1 mmHg. The mean mitral valve gradient is 4.0 mmHg. Tricuspid Valve: The tricuspid valve is normal in structure. Tricuspid valve regurgitation is mild. Aortic Valve: AV is thickened, calcified with restricted motion. Peak and mean gradients through te valve are 62 and 35 mm Hg respectively AVA (VTI) is o.74 cm2. Dimensionless index is 0.33 consistent with moderate AS. Calculation limited by accuracy of LVOT measurements 2 D imaging, AS appears moderate to severe. Compared to echo report from April 2024, mean gradient is mildly increased (30 to 35 mm Hg) and DVI is decreassed (0.33 to 0.3). The aortic valve is tricuspid. Aortic valve regurgitation is not visualized. Aortic valve mean gradient measures 35.0 mmHg. Aortic valve peak gradient measures 61.8 mmHg. Aortic valve area, by VTI measures 0.74 cm. Pulmonic Valve: The pulmonic valve was grossly normal. Pulmonic valve regurgitation is not visualized. Aorta: The aortic root and ascending aorta are structurally normal, with no evidence of dilitation. Venous: The inferior vena cava is normal in size with greater than 50% respiratory variability, suggesting right atrial pressure of 3 mmHg. IAS/Shunts: No atrial level shunt detected by color flow Doppler.  LEFT VENTRICLE PLAX 2D  LVIDd:         3.90 cm   Diastology LVIDs:         2.10 cm   LV e' medial:    3.26 cm/s LV PW:         1.20 cm   LV E/e' medial:  35.6 LV IVS:        1.50 cm   LV e' lateral:   4.35 cm/s LVOT diam:     1.70 cm   LV E/e' lateral: 26.7 LV SV:         70 LV SV Index:   35 LVOT Area:     2.27 cm  RIGHT VENTRICLE            IVC RV Basal diam:  2.70 cm    IVC diam: 1.70 cm RV S prime:     8.16 cm/s TAPSE (M-mode): 2.1 cm LEFT ATRIUM             Index        RIGHT ATRIUM           Index LA diam:        3.50 cm 1.76 cm/m   RA Area:     14.10 cm LA Vol (A2C):   96.2 ml 48.41 ml/m  RA Volume:   35.20 ml  17.71 ml/m LA Vol (A4C):   74.8 ml 37.64 ml/m LA Biplane Vol: 85.3 ml 42.92 ml/m  AORTIC VALVE AV Area (Vmax):    0.83 cm AV Area (Vmean):   0.75 cm AV Area (VTI):     0.74 cm AV Vmax:           393.00 cm/s AV Vmean:          278.000 cm/s AV VTI:            0.947 m AV Peak Grad:      61.8 mmHg AV Mean Grad:      35.0 mmHg LVOT Vmax:         143.00 cm/s LVOT Vmean:        91.350 cm/s LVOT VTI:          0.308 m LVOT/AV VTI ratio: 0.33  AORTA Ao Root diam: 3.00 cm Ao Asc diam:  3.00  cm MITRAL VALVE                TRICUSPID VALVE MV Area (PHT): 2.16 cm     TR Peak grad:   17.6 mmHg MV Area VTI:   1.48 cm     TR Vmax:        210.00 cm/s MV Peak grad:  8.1 mmHg MV Mean grad:  4.0 mmHg     SHUNTS MV Vmax:       1.42 m/s     Systemic VTI:  0.31 m MV Vmean:      94.4 cm/s    Systemic Diam: 1.70 cm MV Decel Time: 352 msec MV E velocity: 116.00 cm/s MV A velocity: 119.00 cm/s MV E/A ratio:  0.97 Dietrich Pates MD Electronically signed by Dietrich Pates MD Signature Date/Time: 09/14/2023/9:58:23 PM    Final    CT Head Wo Contrast Result Date: 09/13/2023 CLINICAL DATA:  Headache, new onset. EXAM: CT HEAD WITHOUT CONTRAST TECHNIQUE: Contiguous axial images were obtained from the base of the skull through the vertex without intravenous contrast. RADIATION DOSE REDUCTION: This exam was performed according to the departmental  dose-optimization program which includes automated exposure control, adjustment of the mA and/or kV according to patient size and/or use of iterative reconstruction technique. COMPARISON:  CT head without contrast 02/12/2022. FINDINGS: Brain: Moderate atrophy and white matter changes are similar to the prior exam. No acute infarct, hemorrhage, or mass lesion is present. The ventricles are proportionate to the degree of atrophy. No significant extraaxial fluid collection is present. The brainstem and cerebellum are within normal limits. Midline structures are within normal limits. Vascular: Atherosclerotic calcifications are present within the cavernous internal carotid arteries bilaterally. No hyperdense vessel is present. Skull: Calvarium is intact. No focal lytic or blastic lesions are present. No significant extracranial soft tissue lesion is present. Sinuses/Orbits: The left anterior paranasal sinuses are opacified. The paranasal sinuses and mastoid air cells are otherwise clear. Bilateral lens replacements are noted. Globes and orbits are otherwise unremarkable. IMPRESSION: 1. No acute intracranial abnormality or significant interval change. 2. Moderate atrophy and white matter disease likely reflects the sequela of chronic microvascular ischemia. 3. Left anterior paranasal sinus disease. Electronically Signed   By: Marin Roberts M.D.   On: 09/13/2023 21:09   DG Chest Port 1 View Result Date: 09/13/2023 CLINICAL DATA:  Weakness EXAM: PORTABLE CHEST 1 VIEW COMPARISON:  08/21/2023 FINDINGS: Left-sided pacing device as before. Mild cardiomegaly. Bronchitic changes. Streaky atelectasis or scarring at the bases. Aortic atherosclerosis. No pneumothorax IMPRESSION: Mild cardiomegaly. Bronchitic changes. Streaky atelectasis or scarring at the bases. Electronically Signed   By: Jasmine Pang M.D.   On: 09/13/2023 21:01    Assessment and Plan Assessment & Plan  1. Hospital discharge follow-up (Primary) 2.  NSTEMI (non-ST elevated myocardial infarction) [facility]) Patient denies chest pain, shortness of breath Echo shows normal left ventricle ejection fraction  3. Paroxysmal atrial fibrillation (HCC) Rate controlled Flecainide was stopped during the hospitalization and changed to amiodarone as per cardiology Continue with flecainide Cardiology follow-up Will check CBC, BMP in 1 week  4. Primary hypertension Continue with Lasix  5. Acute cystitis without hematuria Asymptomatic Continue antibiotics in the hospital  6. Hypothyroidism, unspecified type Continue with Synthyroid    35 min Total time spent for obtaining history,  performing a medically appropriate examination and evaluation, reviewing the tests  documenting clinical information in the electronic or other health record,  ,care coordination (not separately reported)

## 2023-09-18 ENCOUNTER — Other Ambulatory Visit: Payer: Self-pay | Admitting: Family Medicine

## 2023-09-24 ENCOUNTER — Ambulatory Visit (HOSPITAL_BASED_OUTPATIENT_CLINIC_OR_DEPARTMENT_OTHER): Payer: Medicare Other

## 2023-09-24 DIAGNOSIS — I10 Essential (primary) hypertension: Secondary | ICD-10-CM | POA: Diagnosis not present

## 2023-09-24 DIAGNOSIS — Z79899 Other long term (current) drug therapy: Secondary | ICD-10-CM | POA: Diagnosis not present

## 2023-09-26 DIAGNOSIS — H02421 Myogenic ptosis of right eyelid: Secondary | ICD-10-CM | POA: Diagnosis not present

## 2023-09-26 DIAGNOSIS — H02032 Senile entropion of right lower eyelid: Secondary | ICD-10-CM | POA: Diagnosis not present

## 2023-10-02 ENCOUNTER — Other Ambulatory Visit: Payer: Self-pay | Admitting: Sports Medicine

## 2023-10-02 ENCOUNTER — Other Ambulatory Visit: Payer: Self-pay | Admitting: Adult Health

## 2023-10-02 DIAGNOSIS — I480 Paroxysmal atrial fibrillation: Secondary | ICD-10-CM

## 2023-10-02 DIAGNOSIS — H18513 Endothelial corneal dystrophy, bilateral: Secondary | ICD-10-CM | POA: Diagnosis not present

## 2023-10-02 DIAGNOSIS — H02032 Senile entropion of right lower eyelid: Secondary | ICD-10-CM | POA: Diagnosis not present

## 2023-10-02 MED ORDER — AMIODARONE HCL 200 MG PO TABS: 200.0000 mg | ORAL_TABLET | Freq: Every day | ORAL | 1 refills | Status: DC

## 2023-10-02 MED ORDER — APIXABAN 5 MG PO TABS: 5.0000 mg | ORAL_TABLET | Freq: Two times a day (BID) | ORAL | 1 refills | Status: AC

## 2023-10-03 ENCOUNTER — Non-Acute Institutional Stay: Payer: Self-pay | Admitting: Sports Medicine

## 2023-10-03 DIAGNOSIS — E039 Hypothyroidism, unspecified: Secondary | ICD-10-CM | POA: Diagnosis not present

## 2023-10-03 DIAGNOSIS — F0390 Unspecified dementia without behavioral disturbance: Secondary | ICD-10-CM | POA: Diagnosis not present

## 2023-10-03 DIAGNOSIS — K219 Gastro-esophageal reflux disease without esophagitis: Secondary | ICD-10-CM | POA: Diagnosis not present

## 2023-10-03 DIAGNOSIS — I480 Paroxysmal atrial fibrillation: Secondary | ICD-10-CM | POA: Diagnosis not present

## 2023-10-03 DIAGNOSIS — I509 Heart failure, unspecified: Secondary | ICD-10-CM | POA: Diagnosis not present

## 2023-10-03 NOTE — Progress Notes (Unsigned)
 Provider:  Dr. Tye Gall Location:  [facility] Place of Service:   Assisted living   PCP: Tye Gall, MD Patient Care Team: Tye Gall, MD as PCP - General (Internal Medicine) Jolly Needle, MD (Inactive) as PCP - Electrophysiology (Cardiology) Euell Herrlich, MD as PCP - Cardiology (Cardiology) Adair Actis, FNP [facility] and Palliative Medicine)  Extended Emergency Contact Information Primary Emergency Contact: [facility] Phone: [phone] Mobile Phone: [phone] Relation: Daughter Interpreter needed? No Secondary Emergency Contact: Pearman,Karen Address: [address]          [place], [place] [address] [place] Home Phone: [phone] Mobile Phone: [phone] Relation: Daughter  Goals of Care: Advanced Directive information    09/14/2023    1:00 PM  Advanced Directives  Does Patient Have a Medical Advance Directive? Yes  Type of Advance Directive Healthcare Power of Attorney  Does patient want to make changes to medical advance directive? No - Patient declined  Copy of Healthcare Power of Attorney in Chart? No - copy requested      No chief complaint on file.   Discussed the use of AI scribe software for clinical note transcription with the patient, who gave verbal consent to proceed.  History of Present Illness   89 year old female with a past medical history of arctic stenosis, paroxysmal A-fib, hypothyroidism, major neurocognitive disorder, hypothyroidism, GERD is evaluated for the chronic disease management. Patient seen and examined in her room.  She is sitting in her recliner chair.  Seems pleasant and comfortable and does not appear to be in distress.  She is watching TV. Patient was recently hospitalized for A-fib with RVR.  She has a follow-up appointment with cardiology scheduled for next week. Patient denies chest pain, shortness of breath, dizzy, lightheadedness.  She  ambulates with a walker.  She walks to the dining room to eat her meals.  She sleeps in a recliner chair.  Has chronic lower extremity swelling but no new change.  She is currently on amiodarone , Eliquis . Patient denies bloody or dark-colored stools.     Past Medical History:  Diagnosis Date   Aortic stenosis 03/08/2016   Brady-tachy syndrome (HCC)    a. Biotronik dual chamber PPM implanted 2009 b. gen change to STJ dual chamber PPM 2017   Ejection fraction    EF 70%, echo, October, 2009  //   EF 55-60%, septal dyssynergy consistent with a paced rhythm, mild to moderate mitral regurgitation, echo, November, 2015    GERD (gastroesophageal reflux disease) 04/28/2014   Episodes November, 2015 with fluid refluxing from her esophagus.    Hair loss    Patient questioned Coumadin, changed toPradaxa   Hypertension    Hypothyroidism    Lower extremity edema 01/22/2018   Mitral regurgitation 05/03/2014   Mild-to-moderate mitral regurgitation, echo, November, 2015    Osteoarthritis of left knee 10/25/2017   Paroxysmal atrial fibrillation (HCC)    Episodes rapid atrial fib noted by pacemaker interrogation, August, 2011, diltiazem  added, patient improved. Patient continues on Rythmol   //   Changed to flecainide  2013  //   flecainide  level checked in 2013, good level    Persistent atrial fibrillation (HCC)    Past Surgical History:  Procedure Laterality Date   ABDOMINAL HYSTERECTOMY     EP IMPLANTABLE DEVICE N/A 08/08/2015   Generator change with [employer] DR PPM by Dr Nunzio Belch   JOINT REPLACEMENT     PACEMAKER PLACEMENT  2009        reports that  she quit smoking about 33 years ago. Her smoking use included cigarettes. She has never used smokeless tobacco. She reports that she does not drink alcohol and does not use drugs. Social History   Socioeconomic History   Marital status: Widowed    Spouse name: Not on file   Number of children: Not on file   Years of education: Not on file    Highest education level: Not on file  Occupational History   Not on file  Tobacco Use   Smoking status: Former    Current packs/day: 0.00    Types: Cigarettes    Quit date: 09/06/1990    Years since quitting: 33.0   Smokeless tobacco: Never  Vaping Use   Vaping status: Never Used  Substance and Sexual Activity   Alcohol use: No   Drug use: No   Sexual activity: Not on file  Other Topics Concern   Not on file  Social History Narrative   Not on file   Social Drivers of Health   Financial Resource Strain: Not on file  Food Insecurity: No Food Insecurity (09/14/2023)   Hunger Vital Sign    Worried About Running Out of Food in the Last Year: Never true    Ran Out of Food in the Last Year: Never true  Transportation Needs: No Transportation Needs (09/14/2023)   PRAPARE - Administrator, Civil Service (Medical): No    Lack of Transportation (Non-Medical): No  Physical Activity: Not on file  Stress: Not on file  Social Connections: Moderately Integrated (09/14/2023)   Social Connection and Isolation Panel [NHANES]    Frequency of Communication with Friends and Family: Three times a week    Frequency of Social Gatherings with Friends and Family: Three times a week    Attends Religious Services: 1 to 4 times per year    Active Member of Clubs or Organizations: No    Attends Banker Meetings: 1 to 4 times per year    Marital Status: Widowed  Intimate Partner Violence: Not At Risk (09/14/2023)   Humiliation, Afraid, Rape, and Kick questionnaire    Fear of Current or Ex-Partner: No    Emotionally Abused: No    Physically Abused: No    Sexually Abused: No    Functional Status Survey:    Family History  Problem Relation Age of Onset   Heart disease Father    Coronary artery disease Other    Heart disease Son     Health Maintenance  Topic Date Due   COVID-19 Vaccine (3 - Pfizer risk series) 08/08/2023   INFLUENZA VACCINE  01/10/2024   Medicare Annual  Wellness (AWV)  04/08/2024   DTaP/Tdap/Td (3 - Td or Tdap) 04/10/2033   Pneumonia Vaccine 63+ Years old  Completed   DEXA SCAN  Completed   Zoster Vaccines- Shingrix  Completed   HPV VACCINES  Aged Out   Meningococcal B Vaccine  Aged Out      Outpatient Encounter Medications as of 10/03/2023  Medication Sig   acetaminophen  (TYLENOL ) 500 MG tablet Take 1,000 mg by mouth in the morning, at noon, and at bedtime.   amiodarone  (PACERONE ) 200 MG tablet Take 1 tablet (200 mg total) by mouth daily.   apixaban  (ELIQUIS ) 5 MG TABS tablet Take 1 tablet (5 mg total) by mouth 2 (two) times daily.   Apoaequorin (PREVAGEN) 10 MG CAPS Take 10 mg by mouth daily.   betamethasone , augmented, (DIPROLENE ) 0.05 % gel Apply topically daily.  Apply to scalp topically every 12 hours as needed for psoriasis related to PSORIASIS, UNSPECIFIED (L40.9) apply to affected area of scalp   brimonidine -timolol  (COMBIGAN ) 0.2-0.5 % ophthalmic solution Place 1 drop into the right eye every 12 (twelve) hours.   Calcium  Carbonate-Vit D-Min (CALCIUM  600+D PLUS MINERALS) 600-400 MG-UNIT TABS Take 1 tablet by mouth daily.   Carboxymethylcell-Glycerin  PF (REFRESH RELIEVA PF) 0.5-1 % SOLN Apply 1 drop to eye in the morning and at bedtime. for relieve discomfort   diclofenac  Sodium (VOLTAREN ) 1 % GEL Apply 1 g topically 2 (two) times daily as needed (to L foot bunion deformity for pain).   furosemide  (LASIX ) 20 MG tablet TAKE ONE TABLET BY MOUTH EVERY DAY   Ipratropium-Albuterol (COMBIVENT) 20-100 MCG/ACT AERS respimat Inhale 1 puff into the lungs every 6 (six) hours as needed for shortness of breath.   levothyroxine  (SYNTHROID ) 100 MCG tablet TAKE ONE TABLET BY MOUTH EVERY DAY IN THE MORNING   lidocaine  (HM LIDOCAINE  PATCH) 4 % Place 1 patch onto the skin daily. Apply topically to L shoulder in the morning   Multiple Vitamin (MULTIVITAMIN ADULT) TABS Take 1 tablet by mouth daily.   Multiple Vitamins-Minerals (OCUVITE PO) Take 1  capsule by mouth daily.   NAMENDA  10 MG tablet TAKE ONE TABLET BY MOUTH TWICE A DAY FOR COGNITIVE DECLINE   nystatin cream (MYCOSTATIN) Apply 1 Application topically every 12 (twelve) hours as needed (for rash).   pantoprazole (PROTONIX) 40 MG tablet Take 40 mg by mouth daily as needed (for indigestion).   potassium chloride  (KLOR-CON  M) 10 MEQ tablet Take 1 tablet (10 mEq total) by mouth daily.   sodium chloride  (MURO 128) 5 % ophthalmic ointment Place 1 Application into the right eye at bedtime.   sodium chloride  (MURO 128) 5 % ophthalmic solution Place 1 drop into both eyes in the morning and at bedtime. for decrease swelling Wait 1 hour between these and Refresh (Patient taking differently: Place 1 drop into the right eye in the morning and at bedtime. for decrease swelling Wait 1 hour between these and Refresh)   No facility-administered encounter medications on file as of 10/03/2023.    Review of Systems  Constitutional:  Negative for fever.  HENT:  Negative for sore throat.   Respiratory:  Negative for cough and shortness of breath.   Cardiovascular:  Positive for leg swelling. Negative for chest pain and palpitations.  Gastrointestinal:  Negative for abdominal distention, abdominal pain, blood in stool, constipation, diarrhea, nausea and vomiting.  Genitourinary:  Negative for dysuria.  Neurological:  Negative for dizziness and numbness.   Negative unless indicated in HPI.  There were no vitals filed for this visit. There is no height or weight on file to calculate BMI. BP Readings from Last 3 Encounters:  09/16/23 (!) 126/56  09/15/23 (!) 155/89  09/13/23 136/68   Wt Readings from Last 3 Encounters:  09/16/23 195 lb (88.5 kg)  09/15/23 193 lb 12.6 oz (87.9 kg)  09/13/23 190 lb (86.2 kg)   Physical Exam Constitutional:      Appearance: Normal appearance.  HENT:     Head: Normocephalic and atraumatic.  Cardiovascular:     Rate and Rhythm: Normal rate. Rhythm irregular.   Pulmonary:     Effort: Pulmonary effort is normal. No respiratory distress.     Breath sounds: Normal breath sounds. No wheezing.  Abdominal:     General: Bowel sounds are normal. There is no distension.     Tenderness: There is  no abdominal tenderness. There is no guarding.     Comments:    Musculoskeletal:        General: Swelling present.     Comments: Chronic no new change  Neurological:     Mental Status: She is alert. Mental status is at baseline.     Labs reviewed: Basic Metabolic Panel: Recent Labs    09/13/23 2041 09/14/23 0528 09/15/23 0226  NA 141 141 139  K 4.0 3.8 4.0  CL 105 107 107  CO2 24 25 20*  GLUCOSE 118* 106* 115*  BUN 25* 27* 24*  CREATININE 0.88 0.90 0.86  CALCIUM  9.2 8.9 9.0  MG  --  2.1 2.0  PHOS  --  3.6  --    Liver Function Tests: Recent Labs    01/26/23 0000 06/20/23 0000 09/13/23 2041  AST 24 29 63*  ALT 17 15 38  ALKPHOS 40 48 46  BILITOT  --   --  0.7  PROT  --   --  6.6  ALBUMIN 3.7 3.6 3.3*   No results for input(s): "LIPASE", "AMYLASE" in the last 8760 hours. No results for input(s): "AMMONIA" in the last 8760 hours. CBC: Recent Labs    05/24/23 0000 06/20/23 0000 08/21/23 0010 09/13/23 2041 09/14/23 0528 09/15/23 0226  WBC 7.6 4.6   < > 8.3 6.5 5.9  NEUTROABS 4,355.00 2,019.00  --  5.1  --   --   HGB 13.5 13.3   < > 14.9 14.0 14.0  HCT 41 41   < > 44.8 41.9 42.4  MCV  --   --    < > 98.5 98.6 97.9  PLT 136* 92*   < > 154 134* 106*   < > = values in this interval not displayed.   Cardiac Enzymes: No results for input(s): "CKTOTAL", "CKMB", "CKMBINDEX", "TROPONINI" in the last 8760 hours. BNP: Invalid input(s): "POCBNP" No results found for: "HGBA1C" Lab Results  Component Value Date   TSH 4.38 01/26/2023   No results found for: "VITAMINB12" No results found for: "FOLATE" No results found for: "IRON", "TIBC", "FERRITIN"  Imaging and Procedures obtained prior to SNF admission: ECHOCARDIOGRAM  COMPLETE Result Date: 09/14/2023    ECHOCARDIOGRAM REPORT   Patient Name:   [name] Date of Exam: 09/14/2023 Medical Rec #:  [id number]    Height:       65.0 in Accession #:    [id number]   Weight:       202.2 lb Date of Birth:  11/02/1927    BSA:          1.987 m Patient Age:    89 years     BP:           101/49 mmHg Patient Gender: F            HR:           63 bpm. Exam Location:  Inpatient Procedure: 2D Echo (Both Spectral and Color Flow Doppler were utilized during            procedure). Indications:    NSTEMI  History:        Patient has prior history of Echocardiogram examinations, most                 recent 09/17/2022. CHF, Pacemaker, PAD, Aortic Valve Disease,                 Signs/Symptoms:Chest Pain; Risk Factors:Hypertension.  Sonographer:    Barbra Ley  Rosebud Confer RDCS Referring Phys: [id number] CAROLE N HALL  Sonographer Comments: Global longitudinal strain was attempted. IMPRESSIONS  1. Turbulent flow through LV with near cavity obliteration during systole . Left ventricular ejection fraction, by estimation, is >75%. The left ventricle has hyperdynamic function. The left ventricle has no regional wall motion abnormalities. There is severe left ventricular hypertrophy. Left ventricular diastolic parameters are consistent with Grade II diastolic dysfunction (pseudonormalization). Elevated left atrial pressure.  2. Right ventricular systolic function is normal. The right ventricular size is normal. There is normal pulmonary artery systolic pressure.  3. Left atrial size was moderately dilated.  4. Mild mitral valve regurgitation.  5. AV is thickened, calcified with restricted motion. Peak and mean gradients through te valve are 62 and 35 mm Hg respectively AVA (VTI) is o.74 cm2. Dimensionless index is 0.33 consistent with moderate AS. Calculation limited by accuracy of LVOT measurements 2 D imaging, AS appears moderate to severe. Compared to echo report from April 2024, mean gradient is mildly increased (30 to  35 mm Hg) and DVI is decreassed (0.33 to 0.3). Aaron Aas The aortic valve is tricuspid. Aortic valve regurgitation is not visualized.  6. The inferior vena cava is normal in size with greater than 50% respiratory variability, suggesting right atrial pressure of 3 mmHg. FINDINGS  Left Ventricle: Turbulent flow through LV with near cavity obliteration during systole. Left ventricular ejection fraction, by estimation, is >75%. The left ventricle has hyperdynamic function. The left ventricle has no regional wall motion abnormalities. Global longitudinal strain performed but not reported based on interpreter judgement due to suboptimal tracking. The left ventricular internal cavity size was small. There is severe left ventricular hypertrophy. Left ventricular diastolic parameters are consistent with Grade II diastolic dysfunction (pseudonormalization). Elevated left atrial pressure. Right Ventricle: The right ventricular size is normal. Right vetricular wall thickness was not assessed. Right ventricular systolic function is normal. There is normal pulmonary artery systolic pressure. The tricuspid regurgitant velocity is 2.10 m/s, and with an assumed right atrial pressure of 3 mmHg, the estimated right ventricular systolic pressure is 20.6 mmHg. Left Atrium: Left atrial size was moderately dilated. Right Atrium: Right atrial size was normal in size. Pericardium: There is no evidence of pericardial effusion. Mitral Valve: There is mild thickening of the mitral valve leaflet(s). Mild to moderate mitral annular calcification. Mild mitral valve regurgitation. MV peak gradient, 8.1 mmHg. The mean mitral valve gradient is 4.0 mmHg. Tricuspid Valve: The tricuspid valve is normal in structure. Tricuspid valve regurgitation is mild. Aortic Valve: AV is thickened, calcified with restricted motion. Peak and mean gradients through te valve are 62 and 35 mm Hg respectively AVA (VTI) is o.74 cm2. Dimensionless index is 0.33 consistent with  moderate AS. Calculation limited by accuracy of LVOT measurements 2 D imaging, AS appears moderate to severe. Compared to echo report from April 2024, mean gradient is mildly increased (30 to 35 mm Hg) and DVI is decreassed (0.33 to 0.3). The aortic valve is tricuspid. Aortic valve regurgitation is not visualized. Aortic valve mean gradient measures 35.0 mmHg. Aortic valve peak gradient measures 61.8 mmHg. Aortic valve area, by VTI measures 0.74 cm. Pulmonic Valve: The pulmonic valve was grossly normal. Pulmonic valve regurgitation is not visualized. Aorta: The aortic root and ascending aorta are structurally normal, with no evidence of dilitation. Venous: The inferior vena cava is normal in size with greater than 50% respiratory variability, suggesting right atrial pressure of 3 mmHg. IAS/Shunts: No atrial level shunt detected by color flow Doppler.  LEFT VENTRICLE PLAX 2D LVIDd:         3.90 cm   Diastology LVIDs:         2.10 cm   LV e' medial:    3.26 cm/s LV PW:         1.20 cm   LV E/e' medial:  35.6 LV IVS:        1.50 cm   LV e' lateral:   4.35 cm/s LVOT diam:     1.70 cm   LV E/e' lateral: 26.7 LV SV:         70 LV SV Index:   35 LVOT Area:     2.27 cm  RIGHT VENTRICLE            IVC RV Basal diam:  2.70 cm    IVC diam: 1.70 cm RV S prime:     8.16 cm/s TAPSE (M-mode): 2.1 cm LEFT ATRIUM             Index        RIGHT ATRIUM           Index LA diam:        3.50 cm 1.76 cm/m   RA Area:     14.10 cm LA Vol (A2C):   96.2 ml 48.41 ml/m  RA Volume:   35.20 ml  17.71 ml/m LA Vol (A4C):   74.8 ml 37.64 ml/m LA Biplane Vol: 85.3 ml 42.92 ml/m  AORTIC VALVE AV Area (Vmax):    0.83 cm AV Area (Vmean):   0.75 cm AV Area (VTI):     0.74 cm AV Vmax:           393.00 cm/s AV Vmean:          278.000 cm/s AV VTI:            0.947 m AV Peak Grad:      61.8 mmHg AV Mean Grad:      35.0 mmHg LVOT Vmax:         143.00 cm/s LVOT Vmean:        91.350 cm/s LVOT VTI:          0.308 m LVOT/AV VTI ratio: 0.33  AORTA Ao  Root diam: 3.00 cm Ao Asc diam:  3.00 cm MITRAL VALVE                TRICUSPID VALVE MV Area (PHT): 2.16 cm     TR Peak grad:   17.6 mmHg MV Area VTI:   1.48 cm     TR Vmax:        210.00 cm/s MV Peak grad:  8.1 mmHg MV Mean grad:  4.0 mmHg     SHUNTS MV Vmax:       1.42 m/s     Systemic VTI:  0.31 m MV Vmean:      94.4 cm/s    Systemic Diam: 1.70 cm MV Decel Time: 352 msec MV E velocity: 116.00 cm/s MV A velocity: 119.00 cm/s MV E/A ratio:  0.97 Ola Berger MD Electronically signed by Ola Berger MD Signature Date/Time: 09/14/2023/9:58:23 PM    Final    CT Head Wo Contrast Result Date: 09/13/2023 CLINICAL DATA:  Headache, new onset. EXAM: CT HEAD WITHOUT CONTRAST TECHNIQUE: Contiguous axial images were obtained from the base of the skull through the vertex without intravenous contrast. RADIATION DOSE REDUCTION: This exam was performed according to the departmental dose-optimization program which includes automated exposure control, adjustment of the  mA and/or kV according to patient size and/or use of iterative reconstruction technique. COMPARISON:  CT head without contrast 02/12/2022. FINDINGS: Brain: Moderate atrophy and white matter changes are similar to the prior exam. No acute infarct, hemorrhage, or mass lesion is present. The ventricles are proportionate to the degree of atrophy. No significant extraaxial fluid collection is present. The brainstem and cerebellum are within normal limits. Midline structures are within normal limits. Vascular: Atherosclerotic calcifications are present within the cavernous internal carotid arteries bilaterally. No hyperdense vessel is present. Skull: Calvarium is intact. No focal lytic or blastic lesions are present. No significant extracranial soft tissue lesion is present. Sinuses/Orbits: The left anterior paranasal sinuses are opacified. The paranasal sinuses and mastoid air cells are otherwise clear. Bilateral lens replacements are noted. Globes and orbits are otherwise  unremarkable. IMPRESSION: 1. No acute intracranial abnormality or significant interval change. 2. Moderate atrophy and white matter disease likely reflects the sequela of chronic microvascular ischemia. 3. Left anterior paranasal sinus disease. Electronically Signed   By: Audree Leas M.D.   On: 09/13/2023 21:09   DG Chest Port 1 View Result Date: 09/13/2023 CLINICAL DATA:  Weakness EXAM: PORTABLE CHEST 1 VIEW COMPARISON:  08/21/2023 FINDINGS: Left-sided pacing device as before. Mild cardiomegaly. Bronchitic changes. Streaky atelectasis or scarring at the bases. Aortic atherosclerosis. No pneumothorax IMPRESSION: Mild cardiomegaly. Bronchitic changes. Streaky atelectasis or scarring at the bases. Electronically Signed   By: Esmeralda Hedge M.D.   On: 09/13/2023 21:01    Assessment and Plan Assessment & Plan  A-fib Rate controlled Continue with amiodarone , Eliquis  Follow-up with cardiology No signs of bleeding Will check TSH levels   Chronic diastolic heart failure Patient does not appear to be in distress Able to speak in full sentences Has chronic lower extremity swelling but no new change Continue with the Lasix , potassium supplements Avoid salty foods  Hypothyroidism Currently on Synthyroid 100 mcg Will check TSH levels  Major neurocognitive disorder Continue with supportive care Increase cognitively engaging activities Increase physical activity. Continue with Namenda    GERD Denies heartburn, acid reflux Continue with the Protonix.   Family/ staff Communication:   Labs/tests ordered:  Tye Gall

## 2023-10-04 ENCOUNTER — Encounter: Payer: Self-pay | Admitting: Sports Medicine

## 2023-10-10 DIAGNOSIS — E039 Hypothyroidism, unspecified: Secondary | ICD-10-CM | POA: Diagnosis not present

## 2023-10-10 DIAGNOSIS — K649 Unspecified hemorrhoids: Secondary | ICD-10-CM | POA: Diagnosis not present

## 2023-10-11 ENCOUNTER — Ambulatory Visit (INDEPENDENT_AMBULATORY_CARE_PROVIDER_SITE_OTHER): Payer: Medicare Other

## 2023-10-11 ENCOUNTER — Encounter: Payer: Self-pay | Admitting: Cardiology

## 2023-10-11 DIAGNOSIS — I495 Sick sinus syndrome: Secondary | ICD-10-CM | POA: Diagnosis not present

## 2023-10-11 LAB — CUP PACEART REMOTE DEVICE CHECK
Battery Remaining Longevity: 34 mo
Battery Remaining Percentage: 28 %
Battery Voltage: 2.93 V
Brady Statistic AP VP Percent: 1 %
Brady Statistic AP VS Percent: 47 %
Brady Statistic AS VP Percent: 1 %
Brady Statistic AS VS Percent: 53 %
Brady Statistic RA Percent Paced: 47 %
Brady Statistic RV Percent Paced: 1 %
Date Time Interrogation Session: 20250502040013
Implantable Lead Connection Status: 753985
Implantable Lead Connection Status: 753985
Implantable Lead Implant Date: 20091109
Implantable Lead Implant Date: 20091109
Implantable Lead Location: 753859
Implantable Lead Location: 753860
Implantable Lead Model: 350
Implantable Lead Serial Number: 24891460
Implantable Lead Serial Number: 28411861
Implantable Pulse Generator Implant Date: 20170227
Lead Channel Impedance Value: 1275 Ohm
Lead Channel Impedance Value: 460 Ohm
Lead Channel Pacing Threshold Amplitude: 1 V
Lead Channel Pacing Threshold Amplitude: 1 V
Lead Channel Pacing Threshold Pulse Width: 0.4 ms
Lead Channel Pacing Threshold Pulse Width: 0.4 ms
Lead Channel Sensing Intrinsic Amplitude: 12 mV
Lead Channel Sensing Intrinsic Amplitude: 2 mV
Lead Channel Setting Pacing Amplitude: 2 V
Lead Channel Setting Pacing Amplitude: 2.5 V
Lead Channel Setting Pacing Pulse Width: 0.4 ms
Lead Channel Setting Sensing Sensitivity: 2 mV
Pulse Gen Model: 2240
Pulse Gen Serial Number: 7885781

## 2023-10-15 DIAGNOSIS — H18513 Endothelial corneal dystrophy, bilateral: Secondary | ICD-10-CM | POA: Diagnosis not present

## 2023-10-16 DIAGNOSIS — H02032 Senile entropion of right lower eyelid: Secondary | ICD-10-CM | POA: Diagnosis not present

## 2023-10-16 DIAGNOSIS — H0279 Other degenerative disorders of eyelid and periocular area: Secondary | ICD-10-CM | POA: Diagnosis not present

## 2023-10-16 DIAGNOSIS — Z01818 Encounter for other preprocedural examination: Secondary | ICD-10-CM | POA: Diagnosis not present

## 2023-10-16 DIAGNOSIS — H02532 Eyelid retraction right lower eyelid: Secondary | ICD-10-CM | POA: Diagnosis not present

## 2023-10-16 DIAGNOSIS — H02052 Trichiasis without entropian right lower eyelid: Secondary | ICD-10-CM | POA: Diagnosis not present

## 2023-10-17 ENCOUNTER — Other Ambulatory Visit (HOSPITAL_COMMUNITY)

## 2023-11-06 ENCOUNTER — Other Ambulatory Visit: Payer: Self-pay

## 2023-11-06 ENCOUNTER — Encounter (HOSPITAL_COMMUNITY): Payer: Self-pay

## 2023-11-06 ENCOUNTER — Inpatient Hospital Stay (HOSPITAL_COMMUNITY)
Admission: EM | Admit: 2023-11-06 | Discharge: 2023-11-12 | DRG: 480 | Disposition: A | Discharge status: Skilled Nursing Facility | Source: Skilled Nursing Facility | Attending: Internal Medicine | Admitting: Internal Medicine

## 2023-11-06 ENCOUNTER — Emergency Department (HOSPITAL_COMMUNITY)

## 2023-11-06 DIAGNOSIS — Z8249 Family history of ischemic heart disease and other diseases of the circulatory system: Secondary | ICD-10-CM

## 2023-11-06 DIAGNOSIS — Z4789 Encounter for other orthopedic aftercare: Secondary | ICD-10-CM | POA: Diagnosis not present

## 2023-11-06 DIAGNOSIS — Z90710 Acquired absence of both cervix and uterus: Secondary | ICD-10-CM

## 2023-11-06 DIAGNOSIS — N178 Other acute kidney failure: Secondary | ICD-10-CM | POA: Diagnosis not present

## 2023-11-06 DIAGNOSIS — S72001A Fracture of unspecified part of neck of right femur, initial encounter for closed fracture: Secondary | ICD-10-CM | POA: Diagnosis not present

## 2023-11-06 DIAGNOSIS — N179 Acute kidney failure, unspecified: Secondary | ICD-10-CM | POA: Diagnosis not present

## 2023-11-06 DIAGNOSIS — I495 Sick sinus syndrome: Secondary | ICD-10-CM | POA: Diagnosis not present

## 2023-11-06 DIAGNOSIS — M25551 Pain in right hip: Secondary | ICD-10-CM | POA: Diagnosis present

## 2023-11-06 DIAGNOSIS — M1712 Unilateral primary osteoarthritis, left knee: Secondary | ICD-10-CM | POA: Diagnosis not present

## 2023-11-06 DIAGNOSIS — Z79899 Other long term (current) drug therapy: Secondary | ICD-10-CM

## 2023-11-06 DIAGNOSIS — K5900 Constipation, unspecified: Secondary | ICD-10-CM | POA: Diagnosis not present

## 2023-11-06 DIAGNOSIS — K219 Gastro-esophageal reflux disease without esophagitis: Secondary | ICD-10-CM | POA: Diagnosis not present

## 2023-11-06 DIAGNOSIS — I509 Heart failure, unspecified: Secondary | ICD-10-CM | POA: Diagnosis not present

## 2023-11-06 DIAGNOSIS — I6782 Cerebral ischemia: Secondary | ICD-10-CM | POA: Diagnosis not present

## 2023-11-06 DIAGNOSIS — F03A0 Unspecified dementia, mild, without behavioral disturbance, psychotic disturbance, mood disturbance, and anxiety: Secondary | ICD-10-CM | POA: Diagnosis not present

## 2023-11-06 DIAGNOSIS — E039 Hypothyroidism, unspecified: Secondary | ICD-10-CM | POA: Diagnosis not present

## 2023-11-06 DIAGNOSIS — I110 Hypertensive heart disease with heart failure: Secondary | ICD-10-CM | POA: Diagnosis present

## 2023-11-06 DIAGNOSIS — Z888 Allergy status to other drugs, medicaments and biological substances status: Secondary | ICD-10-CM

## 2023-11-06 DIAGNOSIS — Z880 Allergy status to penicillin: Secondary | ICD-10-CM

## 2023-11-06 DIAGNOSIS — I700 Atherosclerosis of aorta: Secondary | ICD-10-CM | POA: Diagnosis not present

## 2023-11-06 DIAGNOSIS — Z881 Allergy status to other antibiotic agents status: Secondary | ICD-10-CM

## 2023-11-06 DIAGNOSIS — R9089 Other abnormal findings on diagnostic imaging of central nervous system: Secondary | ICD-10-CM | POA: Diagnosis not present

## 2023-11-06 DIAGNOSIS — R0902 Hypoxemia: Secondary | ICD-10-CM | POA: Diagnosis not present

## 2023-11-06 DIAGNOSIS — D72829 Elevated white blood cell count, unspecified: Secondary | ICD-10-CM | POA: Diagnosis not present

## 2023-11-06 DIAGNOSIS — I350 Nonrheumatic aortic (valve) stenosis: Secondary | ICD-10-CM | POA: Diagnosis present

## 2023-11-06 DIAGNOSIS — Z66 Do not resuscitate: Secondary | ICD-10-CM | POA: Diagnosis not present

## 2023-11-06 DIAGNOSIS — Z886 Allergy status to analgesic agent status: Secondary | ICD-10-CM

## 2023-11-06 DIAGNOSIS — Z7901 Long term (current) use of anticoagulants: Secondary | ICD-10-CM

## 2023-11-06 DIAGNOSIS — I959 Hypotension, unspecified: Secondary | ICD-10-CM | POA: Diagnosis not present

## 2023-11-06 DIAGNOSIS — S0990XA Unspecified injury of head, initial encounter: Secondary | ICD-10-CM | POA: Diagnosis not present

## 2023-11-06 DIAGNOSIS — I4819 Other persistent atrial fibrillation: Secondary | ICD-10-CM | POA: Diagnosis not present

## 2023-11-06 DIAGNOSIS — S79929A Unspecified injury of unspecified thigh, initial encounter: Secondary | ICD-10-CM | POA: Diagnosis not present

## 2023-11-06 DIAGNOSIS — S72141D Displaced intertrochanteric fracture of right femur, subsequent encounter for closed fracture with routine healing: Secondary | ICD-10-CM | POA: Diagnosis not present

## 2023-11-06 DIAGNOSIS — M810 Age-related osteoporosis without current pathological fracture: Secondary | ICD-10-CM | POA: Diagnosis not present

## 2023-11-06 DIAGNOSIS — J9691 Respiratory failure, unspecified with hypoxia: Secondary | ICD-10-CM | POA: Diagnosis not present

## 2023-11-06 DIAGNOSIS — W19XXXA Unspecified fall, initial encounter: Principal | ICD-10-CM

## 2023-11-06 DIAGNOSIS — D696 Thrombocytopenia, unspecified: Secondary | ICD-10-CM | POA: Diagnosis not present

## 2023-11-06 DIAGNOSIS — E876 Hypokalemia: Secondary | ICD-10-CM | POA: Diagnosis not present

## 2023-11-06 DIAGNOSIS — F03918 Unspecified dementia, unspecified severity, with other behavioral disturbance: Secondary | ICD-10-CM | POA: Diagnosis not present

## 2023-11-06 DIAGNOSIS — W1830XA Fall on same level, unspecified, initial encounter: Secondary | ICD-10-CM | POA: Diagnosis present

## 2023-11-06 DIAGNOSIS — H409 Unspecified glaucoma: Secondary | ICD-10-CM | POA: Diagnosis not present

## 2023-11-06 DIAGNOSIS — I480 Paroxysmal atrial fibrillation: Secondary | ICD-10-CM | POA: Diagnosis not present

## 2023-11-06 DIAGNOSIS — D62 Acute posthemorrhagic anemia: Secondary | ICD-10-CM | POA: Diagnosis not present

## 2023-11-06 DIAGNOSIS — J9601 Acute respiratory failure with hypoxia: Secondary | ICD-10-CM | POA: Diagnosis not present

## 2023-11-06 DIAGNOSIS — K5909 Other constipation: Secondary | ICD-10-CM | POA: Diagnosis not present

## 2023-11-06 DIAGNOSIS — Z950 Presence of cardiac pacemaker: Secondary | ICD-10-CM

## 2023-11-06 DIAGNOSIS — E66811 Obesity, class 1: Secondary | ICD-10-CM | POA: Diagnosis present

## 2023-11-06 DIAGNOSIS — N183 Chronic kidney disease, stage 3 unspecified: Secondary | ICD-10-CM | POA: Diagnosis not present

## 2023-11-06 DIAGNOSIS — R0602 Shortness of breath: Secondary | ICD-10-CM | POA: Diagnosis not present

## 2023-11-06 DIAGNOSIS — E8809 Other disorders of plasma-protein metabolism, not elsewhere classified: Secondary | ICD-10-CM | POA: Diagnosis not present

## 2023-11-06 DIAGNOSIS — Z885 Allergy status to narcotic agent status: Secondary | ICD-10-CM

## 2023-11-06 DIAGNOSIS — S72041A Displaced fracture of base of neck of right femur, initial encounter for closed fracture: Secondary | ICD-10-CM | POA: Diagnosis not present

## 2023-11-06 DIAGNOSIS — J9811 Atelectasis: Secondary | ICD-10-CM | POA: Diagnosis not present

## 2023-11-06 DIAGNOSIS — Z7401 Bed confinement status: Secondary | ICD-10-CM | POA: Diagnosis not present

## 2023-11-06 DIAGNOSIS — I10 Essential (primary) hypertension: Secondary | ICD-10-CM | POA: Diagnosis not present

## 2023-11-06 DIAGNOSIS — S72141A Displaced intertrochanteric fracture of right femur, initial encounter for closed fracture: Principal | ICD-10-CM | POA: Diagnosis present

## 2023-11-06 DIAGNOSIS — R5383 Other fatigue: Secondary | ICD-10-CM | POA: Diagnosis not present

## 2023-11-06 DIAGNOSIS — Y92192 Bathroom in other specified residential institution as the place of occurrence of the external cause: Secondary | ICD-10-CM

## 2023-11-06 DIAGNOSIS — Z043 Encounter for examination and observation following other accident: Secondary | ICD-10-CM | POA: Diagnosis not present

## 2023-11-06 DIAGNOSIS — Z79890 Hormone replacement therapy (postmenopausal): Secondary | ICD-10-CM

## 2023-11-06 DIAGNOSIS — S199XXA Unspecified injury of neck, initial encounter: Secondary | ICD-10-CM | POA: Diagnosis not present

## 2023-11-06 DIAGNOSIS — W1809XA Striking against other object with subsequent fall, initial encounter: Secondary | ICD-10-CM | POA: Diagnosis present

## 2023-11-06 DIAGNOSIS — I130 Hypertensive heart and chronic kidney disease with heart failure and stage 1 through stage 4 chronic kidney disease, or unspecified chronic kidney disease: Secondary | ICD-10-CM | POA: Diagnosis not present

## 2023-11-06 DIAGNOSIS — R0989 Other specified symptoms and signs involving the circulatory and respiratory systems: Secondary | ICD-10-CM | POA: Diagnosis not present

## 2023-11-06 DIAGNOSIS — Z7982 Long term (current) use of aspirin: Secondary | ICD-10-CM

## 2023-11-06 DIAGNOSIS — R918 Other nonspecific abnormal finding of lung field: Secondary | ICD-10-CM | POA: Diagnosis not present

## 2023-11-06 DIAGNOSIS — I5033 Acute on chronic diastolic (congestive) heart failure: Secondary | ICD-10-CM | POA: Diagnosis not present

## 2023-11-06 DIAGNOSIS — H44513 Absolute glaucoma, bilateral: Secondary | ICD-10-CM | POA: Diagnosis not present

## 2023-11-06 DIAGNOSIS — Z6832 Body mass index (BMI) 32.0-32.9, adult: Secondary | ICD-10-CM

## 2023-11-06 DIAGNOSIS — R410 Disorientation, unspecified: Secondary | ICD-10-CM | POA: Diagnosis not present

## 2023-11-06 DIAGNOSIS — Z87891 Personal history of nicotine dependence: Secondary | ICD-10-CM

## 2023-11-06 HISTORY — DX: Presence of cardiac pacemaker: Z95.0

## 2023-11-06 LAB — CBC WITH DIFFERENTIAL/PLATELET
Abs Immature Granulocytes: 0.04 10*3/uL (ref 0.00–0.07)
Basophils Absolute: 0 10*3/uL (ref 0.0–0.1)
Basophils Relative: 1 %
Eosinophils Absolute: 0.2 10*3/uL (ref 0.0–0.5)
Eosinophils Relative: 3 %
HCT: 44.1 % (ref 36.0–46.0)
Hemoglobin: 14.7 g/dL (ref 12.0–15.0)
Immature Granulocytes: 1 %
Lymphocytes Relative: 16 %
Lymphs Abs: 1.1 10*3/uL (ref 0.7–4.0)
MCH: 33.6 pg (ref 26.0–34.0)
MCHC: 33.3 g/dL (ref 30.0–36.0)
MCV: 100.7 fL — ABNORMAL HIGH (ref 80.0–100.0)
Monocytes Absolute: 0.5 10*3/uL (ref 0.1–1.0)
Monocytes Relative: 8 %
Neutro Abs: 5.2 10*3/uL (ref 1.7–7.7)
Neutrophils Relative %: 71 %
Platelets: 127 10*3/uL — ABNORMAL LOW (ref 150–400)
RBC: 4.38 MIL/uL (ref 3.87–5.11)
RDW: 13.2 % (ref 11.5–15.5)
WBC: 7.2 10*3/uL (ref 4.0–10.5)
nRBC: 0 % (ref 0.0–0.2)

## 2023-11-06 LAB — BASIC METABOLIC PANEL WITH GFR
Anion gap: 13 (ref 5–15)
BUN: 18 mg/dL (ref 8–23)
CO2: 26 mmol/L (ref 22–32)
Calcium: 9.6 mg/dL (ref 8.9–10.3)
Chloride: 99 mmol/L (ref 98–111)
Creatinine, Ser: 0.93 mg/dL (ref 0.44–1.00)
GFR, Estimated: 57 mL/min — ABNORMAL LOW (ref >60)
Glucose, Bld: 124 mg/dL — ABNORMAL HIGH (ref 70–99)
Potassium: 3.9 mmol/L (ref 3.5–5.1)
Sodium: 138 mmol/L (ref 135–145)

## 2023-11-06 LAB — PROTIME-INR
INR: 1.2 (ref 0.8–1.2)
Prothrombin Time: 15.8 s — ABNORMAL HIGH (ref 11.4–15.2)

## 2023-11-06 MED ORDER — SENNOSIDES-DOCUSATE SODIUM 8.6-50 MG PO TABS: 1.0000 | ORAL_TABLET | Freq: Every evening | ORAL | Status: DC | PRN

## 2023-11-06 MED ORDER — POLYVINYL ALCOHOL 1.4 % OP SOLN
1.0000 [drp] | Freq: Three times a day (TID) | OPHTHALMIC | Status: DC
  Administered 2023-11-06 – 2023-11-12 (×16): 1 [drp] via OPHTHALMIC
  Filled 2023-11-06: qty 15

## 2023-11-06 MED ORDER — BRIMONIDINE TARTRATE-TIMOLOL 0.2-0.5 % OP SOLN: 1.0000 [drp] | Freq: Two times a day (BID) | OPHTHALMIC | Status: DC

## 2023-11-06 MED ORDER — POLYETHYL GLYC-PROPYL GLYC PF 0.4-0.3 % OP SOLN: 1.0000 [drp] | Freq: Three times a day (TID) | OPHTHALMIC | Status: DC

## 2023-11-06 MED ORDER — AMIODARONE HCL 200 MG PO TABS
200.0000 mg | ORAL_TABLET | Freq: Every day | ORAL | Status: DC
  Administered 2023-11-06 – 2023-11-09 (×4): 200 mg via ORAL
  Filled 2023-11-06 (×4): qty 1

## 2023-11-06 MED ORDER — ACETAMINOPHEN 325 MG PO TABS: 650.0000 mg | ORAL_TABLET | Freq: Four times a day (QID) | ORAL | Status: DC | PRN

## 2023-11-06 MED ORDER — SODIUM CHLORIDE 0.9 % IV SOLN: INTRAVENOUS | Status: AC

## 2023-11-06 MED ORDER — ONDANSETRON HCL 4 MG/2ML IJ SOLN
4.0000 mg | Freq: Four times a day (QID) | INTRAMUSCULAR | Status: DC | PRN
  Administered 2023-11-07 – 2023-11-12 (×2): 4 mg via INTRAVENOUS
  Filled 2023-11-06 (×2): qty 2

## 2023-11-06 MED ORDER — MEMANTINE HCL 10 MG PO TABS
10.0000 mg | ORAL_TABLET | Freq: Two times a day (BID) | ORAL | Status: DC
  Administered 2023-11-06 – 2023-11-12 (×12): 10 mg via ORAL
  Filled 2023-11-06 (×12): qty 1

## 2023-11-06 MED ORDER — FENTANYL CITRATE PF 50 MCG/ML IJ SOSY
50.0000 ug | PREFILLED_SYRINGE | INTRAMUSCULAR | Status: AC | PRN
  Administered 2023-11-06 (×3): 50 ug via INTRAVENOUS
  Filled 2023-11-06 (×2): qty 1

## 2023-11-06 MED ORDER — POTASSIUM CHLORIDE CRYS ER 10 MEQ PO TBCR
10.0000 meq | EXTENDED_RELEASE_TABLET | Freq: Every day | ORAL | Status: DC
  Administered 2023-11-07 – 2023-11-12 (×6): 10 meq via ORAL
  Filled 2023-11-06 (×6): qty 1

## 2023-11-06 MED ORDER — ACETAMINOPHEN 650 MG RE SUPP: 650.0000 mg | Freq: Four times a day (QID) | RECTAL | Status: DC | PRN

## 2023-11-06 MED ORDER — ADULT MULTIVITAMIN W/MINERALS CH
1.0000 | ORAL_TABLET | Freq: Every day | ORAL | Status: DC
  Administered 2023-11-07 – 2023-11-12 (×6): 1 via ORAL
  Filled 2023-11-06 (×6): qty 1

## 2023-11-06 MED ORDER — CALCIUM 600+D PLUS MINERALS 600-400 MG-UNIT PO TABS: 1.0000 | ORAL_TABLET | Freq: Every morning | ORAL | Status: DC

## 2023-11-06 MED ORDER — TIMOLOL MALEATE 0.5 % OP SOLN
1.0000 [drp] | Freq: Two times a day (BID) | OPHTHALMIC | Status: DC
  Administered 2023-11-06 – 2023-11-12 (×12): 1 [drp] via OPHTHALMIC
  Filled 2023-11-06: qty 5

## 2023-11-06 MED ORDER — ONDANSETRON HCL 4 MG PO TABS: 4.0000 mg | ORAL_TABLET | Freq: Four times a day (QID) | ORAL | Status: DC | PRN

## 2023-11-06 MED ORDER — HYDROMORPHONE HCL 1 MG/ML IJ SOLN
0.5000 mg | Freq: Four times a day (QID) | INTRAMUSCULAR | Status: DC | PRN
  Administered 2023-11-06 – 2023-11-12 (×11): 0.5 mg via INTRAVENOUS
  Filled 2023-11-06 (×3): qty 1
  Filled 2023-11-06 (×2): qty 0.5
  Filled 2023-11-06: qty 1
  Filled 2023-11-06 (×3): qty 0.5
  Filled 2023-11-06: qty 1
  Filled 2023-11-06 (×2): qty 0.5

## 2023-11-06 MED ORDER — ACETAMINOPHEN 500 MG PO TABS
1000.0000 mg | ORAL_TABLET | Freq: Three times a day (TID) | ORAL | Status: DC
  Administered 2023-11-06 – 2023-11-12 (×13): 1000 mg via ORAL
  Filled 2023-11-06 (×14): qty 2

## 2023-11-06 MED ORDER — FENTANYL CITRATE PF 50 MCG/ML IJ SOSY
50.0000 ug | PREFILLED_SYRINGE | INTRAMUSCULAR | Status: AC | PRN
  Administered 2023-11-06 (×2): 50 ug via INTRAVENOUS
  Filled 2023-11-06 (×2): qty 1

## 2023-11-06 MED ORDER — FUROSEMIDE 20 MG PO TABS
20.0000 mg | ORAL_TABLET | Freq: Every day | ORAL | Status: DC
  Administered 2023-11-07: 20 mg via ORAL
  Filled 2023-11-06: qty 1

## 2023-11-06 MED ORDER — BRIMONIDINE TARTRATE 0.2 % OP SOLN
1.0000 [drp] | Freq: Two times a day (BID) | OPHTHALMIC | Status: DC
  Administered 2023-11-06 – 2023-11-12 (×12): 1 [drp] via OPHTHALMIC
  Filled 2023-11-06: qty 5

## 2023-11-06 MED ORDER — LEVOTHYROXINE SODIUM 100 MCG PO TABS
100.0000 ug | ORAL_TABLET | Freq: Every day | ORAL | Status: DC
  Administered 2023-11-07 – 2023-11-12 (×6): 100 ug via ORAL
  Filled 2023-11-06 (×6): qty 1

## 2023-11-06 MED ORDER — OYSTER SHELL CALCIUM/D3 500-5 MG-MCG PO TABS
1.0000 | ORAL_TABLET | Freq: Every day | ORAL | Status: DC
  Administered 2023-11-07 – 2023-11-12 (×6): 1 via ORAL
  Filled 2023-11-06 (×7): qty 1

## 2023-11-06 MED ORDER — LIDOCAINE 5 % EX PTCH
1.0000 | MEDICATED_PATCH | CUTANEOUS | Status: DC
  Administered 2023-11-06 – 2023-11-11 (×5): 1 via TRANSDERMAL
  Filled 2023-11-06 (×6): qty 1

## 2023-11-06 MED ORDER — HYDROCODONE-ACETAMINOPHEN 5-325 MG PO TABS
1.0000 | ORAL_TABLET | Freq: Four times a day (QID) | ORAL | Status: DC | PRN
  Administered 2023-11-07 – 2023-11-12 (×7): 1 via ORAL
  Filled 2023-11-06 (×8): qty 1

## 2023-11-06 NOTE — ED Provider Notes (Signed)
 [facility] Provider Note   CSN: [id number] Arrival date & time: 11/06/23  1537     History  Chief Complaint  Patient presents with   Fall   Hip Pain    LAJUAN GODBEE is a 89 y.o. female.   Fall  Hip Pain  Patient presents after fall.  Medical history includes atrial fibrillation, HTN, GERD, aortic stenosis, arthritis.  She is on Eliquis .  Last dose was this morning.  [facility].  Today, while walking out of her bathroom, she had a mechanical fall.  She believes that she struck the right side of her body.  She has since had pain in the area of her right hip.  She denies any other areas of discomfort. She is unsure if she struck her head.       Home Medications Prior to Admission medications   Medication Sig Start Date End Date Taking? Authorizing Provider  acetaminophen  (TYLENOL ) 500 MG tablet Take 1,000 mg by mouth in the morning, at noon, and at bedtime.    [provider]  amiodarone  (PACERONE ) 200 MG tablet Take 1 tablet (200 mg total) by mouth daily. 10/02/23   Medina-Vargas, Monina C, NP  apixaban  (ELIQUIS ) 5 MG TABS tablet Take 1 tablet (5 mg total) by mouth 2 (two) times daily. 10/02/23   Medina-Vargas, Monina C, NP  Apoaequorin (PREVAGEN) 10 MG CAPS Take 10 mg by mouth daily. 11/13/22   Doreen Gamma, MD  betamethasone , augmented, (DIPROLENE ) 0.05 % gel Apply topically daily.  Apply to scalp topically every 12 hours as needed for psoriasis related to PSORIASIS, UNSPECIFIED (L40.9) apply to affected area of scalp 11/13/22   Doreen Gamma, MD  brimonidine -timolol  (COMBIGAN ) 0.2-0.5 % ophthalmic solution Place 1 drop into the right eye every 12 (twelve) hours.    [provider]  Calcium  Carbonate-Vit D-Min (CALCIUM  600+D PLUS MINERALS) 600-400 MG-UNIT TABS Take 1 tablet by mouth daily. 11/13/22   Doreen Gamma, MD  Carboxymethylcell-Glycerin  PF (REFRESH RELIEVA PF)  0.5-1 % SOLN Apply 1 drop to eye in the morning and at bedtime. for relieve discomfort 11/13/22   Doreen Gamma, MD  diclofenac  Sodium (VOLTAREN ) 1 % GEL Apply 1 g topically 2 (two) times daily as needed (to L foot bunion deformity for pain).    [provider]  furosemide  (LASIX ) 20 MG tablet TAKE ONE TABLET BY MOUTH EVERY DAY 03/19/23   Tye Gall, MD  Ipratropium-Albuterol  (COMBIVENT) 20-100 MCG/ACT AERS respimat Inhale 1 puff into the lungs every 6 (six) hours as needed for shortness of breath.    [provider]  levothyroxine  (SYNTHROID ) 100 MCG tablet TAKE ONE TABLET BY MOUTH EVERY DAY IN THE MORNING 09/18/23   Tye Gall, MD  lidocaine  (HM LIDOCAINE  PATCH) 4 % Place 1 patch onto the skin daily. Apply topically to L shoulder in the morning    [provider]  Multiple Vitamin (MULTIVITAMIN ADULT) TABS Take 1 tablet by mouth daily.    [provider]  Multiple Vitamins-Minerals (OCUVITE PO) Take 1 capsule by mouth daily.    [provider]  NAMENDA  10 MG tablet TAKE ONE TABLET BY MOUTH TWICE A DAY FOR COGNITIVE DECLINE 04/23/23   Veludandi, Prashanthi, MD  nystatin cream (MYCOSTATIN) Apply 1 Application topically every 12 (twelve) hours as needed (for rash).    [provider]  pantoprazole (PROTONIX) 40 MG tablet Take 40 mg by mouth daily  as needed (for indigestion).    [provider]  potassium chloride  (KLOR-CON  M) 10 MEQ tablet Take 1 tablet (10 mEq total) by mouth daily. 06/28/23   Tye Gall, MD  sodium chloride  (MURO 128) 5 % ophthalmic ointment Place 1 Application into the right eye at bedtime.    [provider]  sodium chloride  (MURO 128) 5 % ophthalmic solution Place 1 drop into both eyes in the morning and at bedtime. for decrease swelling Wait 1 hour between these and Refresh Patient taking differently: Place 1 drop into the right eye in the morning and at bedtime. for decrease  swelling Wait 1 hour between these and Refresh 07/10/22   Doreen Gamma, MD      Allergies    Morphine and codeine, Penicillins, Lasix  [furosemide ], Aspirin , Clindamycin/lincomycin, Crestor  [rosuvastatin ], Diltiazem , Erythromycin, Lisinopril, Metronidazole, Morphine, and Penicillins cross reactors    Review of Systems   Review of Systems  Musculoskeletal:  Positive for arthralgias.  All other systems reviewed and are negative.   Physical Exam Updated Vital Signs BP (!) 164/61   Pulse (!) 59   Temp 97.9 F (36.6 C) (Oral)   Resp 14   Ht 5\' 5"  (1.651 m)   Wt 88.5 kg   SpO2 96%   BMI 32.45 kg/m  Physical Exam Vitals and nursing note reviewed.  Constitutional:      General: She is not in acute distress.    Appearance: Normal appearance. She is well-developed. She is not ill-appearing, toxic-appearing or diaphoretic.  HENT:     Head: Normocephalic and atraumatic.     Right Ear: External ear normal.     Left Ear: External ear normal.     Nose: Nose normal.     Mouth/Throat:     Mouth: Mucous membranes are moist.  Eyes:     Extraocular Movements: Extraocular movements intact.     Conjunctiva/sclera: Conjunctivae normal.  Cardiovascular:     Rate and Rhythm: Normal rate and regular rhythm.     Pulses: Normal pulses.  Pulmonary:     Effort: Pulmonary effort is normal. No respiratory distress.     Breath sounds: No wheezing or rales.  Chest:     Chest wall: No tenderness.  Abdominal:     General: There is no distension.     Palpations: Abdomen is soft.     Tenderness: There is no abdominal tenderness.  Musculoskeletal:        General: No swelling. Normal range of motion.     Cervical back: Normal range of motion and neck supple.     Right lower leg: No edema.     Left lower leg: No edema.  Skin:    General: Skin is warm and dry.     Coloration: Skin is not jaundiced or pale.  Neurological:     General: No focal deficit present.     Mental Status: She is alert and  oriented to person, place, and time.  Psychiatric:        Mood and Affect: Mood normal.        Behavior: Behavior normal.     ED Results / Procedures / Treatments   Labs (all labs ordered are listed, but only abnormal results are displayed) Labs Reviewed  BASIC METABOLIC PANEL WITH GFR - Abnormal; Notable for the following components:      Result Value   Glucose, Bld 124 (*)    GFR, Estimated 57 (*)    All other components within normal  limits  CBC WITH DIFFERENTIAL/PLATELET - Abnormal; Notable for the following components:   MCV 100.7 (*)    Platelets 127 (*)    All other components within normal limits  PROTIME-INR - Abnormal; Notable for the following components:   Prothrombin Time 15.8 (*)    All other components within normal limits    EKG None  Radiology CT HEAD WO CONTRAST Result Date: 11/06/2023 CLINICAL DATA:  Head trauma, mechanical fall. EXAM: CT HEAD WITHOUT CONTRAST CT CERVICAL SPINE WITHOUT CONTRAST TECHNIQUE: Multidetector CT imaging of the head and cervical spine was performed following the standard protocol without intravenous contrast. Multiplanar CT image reconstructions of the cervical spine were also generated. RADIATION DOSE REDUCTION: This exam was performed according to the departmental dose-optimization program which includes automated exposure control, adjustment of the mA and/or kV according to patient size and/or use of iterative reconstruction technique. COMPARISON:  CT head 09/13/2023, CT cervical spine 09/25/2020. FINDINGS: CT HEAD FINDINGS Brain: No acute intracranial hemorrhage. No CT evidence of acute infarct. Nonspecific hypoattenuation in the periventricular and subcortical white matter favored to reflect chronic microvascular ischemic changes. Generalized parenchymal volume loss. No edema, mass effect, or midline shift. The basilar cisterns are patent. Ventricles: The ventricles are normal. Vascular: Atherosclerotic calcifications of the carotid  siphons. No hyperdense vessel. Skull: No acute or aggressive finding. Orbits: Orbits are symmetric. Sinuses: Complete opacification of the left maxillary sinus. Mild mucosal thickening in the ethmoid sinuses and right sphenoid sinus. Other: Mastoid air cells are clear. CT CERVICAL SPINE FINDINGS Alignment: Straightening of the normal cervical lordosis. No significant listhesis. No facet subluxation or dislocation. Skull base and vertebrae: No acute fracture. No primary bone lesion or focal pathologic process. Soft tissues and spinal canal: No prevertebral fluid or swelling. No visible canal hematoma. Disc levels: Intervertebral disc space narrowing at multiple levels most pronounced at C4-5. Disc osteophyte complexes at multiple levels. Disc bulge and thickening of the ligamentum flavum at C3-4 resulting in moderate spinal canal stenosis. Additional disc osteophyte complexes at C4-5 and C5-6 contributing to moderate spinal canal stenosis. Facet arthrosis and uncovertebral hypertrophy at multiple levels. Foraminal narrowing most pronounced on the left at C6-7. Upper chest: Negative. Other: None. IMPRESSION: No CT evidence of acute intracranial abnormality. No acute fracture or traumatic malalignment of the cervical spine. Degenerative changes as above. Moderate chronic microvascular ischemic changes and generalized parenchymal volume loss. Electronically Signed   By: Denny Flack M.D.   On: 11/06/2023 18:25   CT CERVICAL SPINE WO CONTRAST Result Date: 11/06/2023 CLINICAL DATA:  Head trauma, mechanical fall. EXAM: CT HEAD WITHOUT CONTRAST CT CERVICAL SPINE WITHOUT CONTRAST TECHNIQUE: Multidetector CT imaging of the head and cervical spine was performed following the standard protocol without intravenous contrast. Multiplanar CT image reconstructions of the cervical spine were also generated. RADIATION DOSE REDUCTION: This exam was performed according to the departmental dose-optimization program which includes  automated exposure control, adjustment of the mA and/or kV according to patient size and/or use of iterative reconstruction technique. COMPARISON:  CT head 09/13/2023, CT cervical spine 09/25/2020. FINDINGS: CT HEAD FINDINGS Brain: No acute intracranial hemorrhage. No CT evidence of acute infarct. Nonspecific hypoattenuation in the periventricular and subcortical white matter favored to reflect chronic microvascular ischemic changes. Generalized parenchymal volume loss. No edema, mass effect, or midline shift. The basilar cisterns are patent. Ventricles: The ventricles are normal. Vascular: Atherosclerotic calcifications of the carotid siphons. No hyperdense vessel. Skull: No acute or aggressive finding. Orbits: Orbits are symmetric. Sinuses: Complete opacification of  the left maxillary sinus. Mild mucosal thickening in the ethmoid sinuses and right sphenoid sinus. Other: Mastoid air cells are clear. CT CERVICAL SPINE FINDINGS Alignment: Straightening of the normal cervical lordosis. No significant listhesis. No facet subluxation or dislocation. Skull base and vertebrae: No acute fracture. No primary bone lesion or focal pathologic process. Soft tissues and spinal canal: No prevertebral fluid or swelling. No visible canal hematoma. Disc levels: Intervertebral disc space narrowing at multiple levels most pronounced at C4-5. Disc osteophyte complexes at multiple levels. Disc bulge and thickening of the ligamentum flavum at C3-4 resulting in moderate spinal canal stenosis. Additional disc osteophyte complexes at C4-5 and C5-6 contributing to moderate spinal canal stenosis. Facet arthrosis and uncovertebral hypertrophy at multiple levels. Foraminal narrowing most pronounced on the left at C6-7. Upper chest: Negative. Other: None. IMPRESSION: No CT evidence of acute intracranial abnormality. No acute fracture or traumatic malalignment of the cervical spine. Degenerative changes as above. Moderate chronic microvascular  ischemic changes and generalized parenchymal volume loss. Electronically Signed   By: Denny Flack M.D.   On: 11/06/2023 18:25   CT PELVIS WO CONTRAST Result Date: 11/06/2023 CLINICAL DATA:  Hip trauma, fracture suspected, xray done Right hip pain. EXAM: CT OF THE PELVIS WITHOUT CONTRAST TECHNIQUE: Multidetector CT imaging of the pelvis was performed according to the standard protocol. Multiplanar CT image reconstructions were also generated. RADIATION DOSE REDUCTION: This exam was performed according to the departmental dose-optimization program which includes automated exposure control, adjustment of the mA and/or kV according to patient size and/or use of iterative reconstruction technique. COMPARISON:  Radiograph earlier today FINDINGS: Bones/Joint/Cartilage Comminuted and displaced proximal femur fracture. There is a displaced component involving the lesser trochanter. There is a transverse subtrochanteric component. Fracture extends to involve the base of the femoral neck. No hip dislocation, the femoral head remains seated. Intact pubic rami. No sacral fracture. Minimal right hip joint effusion. Ligaments Suboptimally assessed by CT. Muscles and Tendons Mild edema in the skill a chair today shin to the fracture. No muscle atrophy. Soft tissues Colonic diverticulosis without diverticulitis. Stable 3.7 cm cystic structure in the left adnexa adjacent to the left aspect of the vaginal cuff from 2023 hip CT, likely benign given stability. There is a fat containing umbilical hernia. Aortic atherosclerosis. IMPRESSION: Comminuted and displaced proximal right femur fracture with a displaced component involving the lesser trochanter. There is a transverse subtrochanteric component. Fracture extends to involve the base of the femoral neck. Aortic Atherosclerosis (ICD10-I70.0). Electronically Signed   By: Chadwick Colonel M.D.   On: 11/06/2023 18:19   DG Hip Unilat With Pelvis 2-3 Views Right Result Date:  11/06/2023 CLINICAL DATA:  fall EXAM: DG HIP (WITH OR WITHOUT PELVIS) 2-3V RIGHT COMPARISON:  None Available. FINDINGS: Acute comminuted and markedly displaced right intertrochanteric femoral fracture. No right hip dislocation. Query slight irregularity of the left femoral neck. Poorly visualized left hip- limited evaluation due to overlapping osseous structures and overlying soft tissues. view. No acute displaced fracture or diastasis of the bones of the pelvis. No acute displaced fractured diastasis of the bones of the pelvis. There is no evidence of severe arthropathy or other focal bone abnormality. IMPRESSION: Acute comminuted and markedly displaced right intertrochanteric femoral fracture. Poorly visualized left hip- limited evaluation due to overlapping osseous structures and overlying soft tissues. Query slight irregularity of the left femoral neck. Recommend dedicated three view radiograph of the left hip for further evaluation. Electronically Signed   By: Morgane  Naveau M.D.  On: 11/06/2023 17:48   DG Chest Port 1 View Result Date: 11/06/2023 CLINICAL DATA:  fall EXAM: PORTABLE CHEST 1 VIEW COMPARISON:  Chest x-Finkler 09/13/2023 FINDINGS: Left chest wall dual lead pacemaker. The heart and mediastinal contours are unchanged. Atherosclerotic plaque. Left base atelectasis. No focal consolidation. Chronic coarsened interstitial markings with no overt pulmonary edema. No pleural effusion. No pneumothorax. No acute osseous abnormality. IMPRESSION: 1. No active disease. 2.  Aortic Atherosclerosis (ICD10-I70.0). Electronically Signed   By: Morgane  Naveau M.D.   On: 11/06/2023 17:43    Procedures Procedures    Medications Ordered in ED Medications  fentaNYL  (SUBLIMAZE ) injection 50 mcg (50 mcg Intravenous Given 11/06/23 1712)  fentaNYL  (SUBLIMAZE ) injection 50 mcg (50 mcg Intravenous Given 11/06/23 1854)    ED Course/ Medical Decision Making/ A&P                                 Medical Decision  Making Amount and/or Complexity of Data Reviewed Labs: ordered. Radiology: ordered.  Risk Prescription drug management.   This patient presents to the ED for concern of fall, this involves an extensive number of treatment options, and is a complaint that carries with it a high risk of complications and morbidity.  The differential diagnosis includes acute injuries   Co morbidities / Chronic conditions that complicate the patient evaluation  atrial fibrillation, HTN, GERD, aortic stenosis, arthritis   Additional history obtained:  Additional history obtained from EMR External records from outside source obtained and reviewed including N/A   Lab Tests:  I Ordered, and personally interpreted labs.  The pertinent results include: Normal hemoglobin, no leukocytosis, normal kidney function, normal electrolytes   Imaging Studies ordered:  I ordered imaging studies including x-Kruczek of chest and right hip; CT scan of head, cervical spine, pelvis I independently visualized and interpreted imaging which showed comminuted and displaced right intertrochanteric femoral fracture with transverse subtrochanteric component I agree with the radiologist interpretation   Cardiac Monitoring: / EKG:  The patient was maintained on a cardiac monitor.  I personally viewed and interpreted the cardiac monitored which showed an underlying rhythm of: Atrial fibrillation   Problem List / ED Course / Critical interventions / Medication management  Patient presenting after mechanical fall.  She has since had pain in the area of her right hip.  On arrival in the ED, vital signs notable for hypertension.  Patient is alert and oriented.  She does have spotty recollections of her fall as well as her current living situation.  It does appear that [facility].  She does likely have some mild dementia.  Review of facility paperwork shows that she is still on Eliquis  and did get a  dose this morning.  No obvious deformity to right hip is noted on exam.  Distal extremity is neurovascularly intact.  Patient has no other areas of pain or tenderness.  She received 100 mcg of fentanyl  prior to arrival.  Additional pain medication was ordered.  Workup was initiated.  Right hip x-Gowdy shows comminuted and displaced right intertrochanteric femur fracture.  I spoke with orthopedic surgeon on-call, Dr. Hermina Loosen, who will plan on operative repair.  Patient to be admitted to medicine for further management. I ordered medication including fentanyl  for analgesia Reevaluation of the patient after these medicines showed that the patient improved I have reviewed the patients home medicines and have made adjustments as needed  Consultations Obtained:  I requested consultation with the orthopedic surgeon, Dr. Hermina Loosen,  and discussed lab and imaging findings as well as pertinent plan - they recommend: Admission to medicine, orthopedic surgery will see in consult.   Social Determinants of Health:  Resides in assisted living facility         Final Clinical Impression(s) / ED Diagnoses Final diagnoses:  Fall, initial encounter  Closed fracture of right hip, initial encounter [facility])    Rx / DC Orders ED Discharge Orders     None         Iva Mariner, MD 11/06/23 1905

## 2023-11-06 NOTE — ED Notes (Signed)
 Dr. Kermit Ped at bedside and wanting to trial patient on RA.  Pt was placed on 3L  by prior shift d/t pt starting to sleep and dropping to approx 85% after medications.

## 2023-11-06 NOTE — ED Notes (Signed)
 X-ray at bedside

## 2023-11-06 NOTE — ED Notes (Signed)
 Patient transported to CT

## 2023-11-06 NOTE — H&P (Signed)
 History and Physical  [name]:[id number] DOB: [birth year] DOA: 11/06/2023  PCP: Tye Gall, MD   Chief Complaint: Fall, right hip pain  HPI: [name] is a 89 y.o. female with medical history significant for A-fib on Eliquis , diastolic heart failure, aortic stenosis, tachybrady syndrome and sick sinus dysfunction s/p pacemaker, GERD, senile dementia, HTN and hypothyroidism who presented from assisted living facility for evaluation after fall.  Patient reports that earlier today, while leaving her bathroom, she thinks she tripped on the edge of the rug and fell on her right side.  She was unable to get up and had significant pain on her right hip and low back.  EMS was called and patient presented to the ED for further evaluation.  She denies hitting her head denies any dizziness, palpitations or leg weakness prior to the fall.  She denies any vision changes, chest pain, shortness of breath, numbness or tingling.  Her last fall was in January 2025 but she did not sustain any injury.  ED Course: Initial vitals show patient afebrile, HR in the 60s, BP 184/69, SpO2 94% on room air.  Briefly placed on 3 L [place] due to mild drop in O2 after receiving IV fentanyl .  Initial labs shows normal BMP, normal CBC, PT/INR 15.8/1.2.  EKG shows A-fib with rate of 60 and LVH. Trauma scan shows negative CT head trauma scan shows negative CT head, and CT cervical spine. X-[place] of the hip and CT pelvis shows acute comminuted and markedly displaced right intertrochanteric femur fracture.  Patient received IV fentanyl  50 mcg x 1. Orthopedic surgery was consulted for evaluation.  TRH was consulted for admission.  Review of Systems: Please see HPI for pertinent positives and negatives. A complete 10 system review of systems are otherwise negative.  Past Medical History:  Diagnosis Date   Aortic stenosis 03/08/2016   Brady-tachy syndrome (HCC)    a. Biotronik dual chamber PPM implanted 2009 b. gen change to  STJ dual chamber PPM 2017   Ejection fraction    EF 70%, echo, October, 2009  //   EF 55-60%, septal dyssynergy consistent with a paced rhythm, mild to moderate mitral regurgitation, echo, November, 2015    GERD (gastroesophageal reflux disease) 04/28/2014   Episodes November, 2015 with fluid refluxing from her esophagus.    Hair loss    Patient questioned Coumadin, changed toPradaxa   Hypertension    Hypothyroidism    Lower extremity edema 01/22/2018   Mitral regurgitation 05/03/2014   Mild-to-moderate mitral regurgitation, echo, November, 2015    Osteoarthritis of left knee 10/25/2017   Paroxysmal atrial fibrillation (HCC)    Episodes rapid atrial fib noted by pacemaker interrogation, August, 2011, diltiazem  added, patient improved. Patient continues on Rythmol   //   Changed to flecainide  2013  //   flecainide  level checked in 2013, good level    Persistent atrial fibrillation (HCC)    Past Surgical History:  Procedure Laterality Date   ABDOMINAL HYSTERECTOMY     EP IMPLANTABLE DEVICE N/A 08/08/2015   Generator change with [employer] DR PPM by Dr Nunzio Belch   JOINT REPLACEMENT     PACEMAKER PLACEMENT  2009       Social History:  reports that she quit smoking about 33 years ago. Her smoking use included cigarettes. She has never used smokeless tobacco. She reports that she does not drink alcohol and does not use drugs.  Allergies  Allergen Reactions   Morphine And Codeine Other (See Comments)  Patient states that she went "crazy" with this   Penicillins Swelling and Rash    Has patient had a PCN reaction causing immediate rash, facial/tongue/throat swelling, SOB or lightheadedness with hypotension: Yes Has patient had a PCN reaction causing severe rash involving mucus membranes or skin necrosis: Yes Has patient had a PCN reaction that required hospitalization No Has patient had a PCN reaction occurring within the last 10 years: No If all of the above answers are "NO", then may  proceed with Cephalosporin use.   Lasix  [Furosemide ] Itching    Patient stated it made her itch all over and dry. Not listed on the [facility]   Aspirin      Pacemaker   Clindamycin/Lincomycin Other (See Comments)    Any meds with mycin in the name Unknown reaction Not listed on the [facility]   Crestor  [Rosuvastatin ]     Other reaction(s): muscle pain Not listed on the [facility]   Diltiazem      Skin discoloration, lower extremity swelling Not listed on the [facility]   Erythromycin Other (See Comments)    Any meds with mycin in the name Unknown reaction Not listed on the [facility]   Lisinopril Other (See Comments)    REACTION: Cough   Metronidazole     Other reaction(s): mouth sores, tongue swelling   Morphine    Penicillins Cross Reactors Other (See Comments)    Any meds with mycin in the name Unknown reaction    Family History  Problem Relation Age of Onset   Heart disease Father    Coronary artery disease Other    Heart disease Son      Prior to Admission medications   Medication Sig Start Date End Date Taking? Authorizing Provider  acetaminophen  (TYLENOL ) 500 MG tablet Take 1,000 mg by mouth in the morning, at noon, and at bedtime.   Yes [provider]  amiodarone  (PACERONE ) 200 MG tablet Take 1 tablet (200 mg total) by mouth daily. Patient taking differently: Take 200 mg by mouth at bedtime. 10/02/23  Yes Medina-Vargas, Monina C, NP  apixaban  (ELIQUIS ) 5 MG TABS tablet Take 1 tablet (5 mg total) by mouth 2 (two) times daily. 10/02/23  Yes Medina-Vargas, Monina C, NP  Apoaequorin (PREVAGEN) 10 MG CAPS Take 10 mg by mouth daily. Patient taking differently: Take 10 mg by mouth in the morning. 11/13/22  Yes Doreen Gamma, MD  betamethasone , augmented, (DIPROLENE ) 0.05 % gel Apply topically daily.  Apply to scalp topically every 12 hours as needed for psoriasis related to PSORIASIS, UNSPECIFIED (L40.9) apply to affected area of scalp 11/13/22  Yes Doreen Gamma, MD  brimonidine -timolol   (COMBIGAN ) 0.2-0.5 % ophthalmic solution Place 1 drop into the right eye every 12 (twelve) hours.   Yes [provider]  Calcium  Carbonate-Vit D-Min (CALCIUM  600+D PLUS MINERALS) 600-400 MG-UNIT TABS Take 1 tablet by mouth daily. Patient taking differently: Take 1 tablet by mouth in the morning. 11/13/22  Yes Doreen Gamma, MD  diclofenac  Sodium (VOLTAREN ) 1 % GEL Apply 1 g topically 2 (two) times daily as needed (to L foot bunion deformity for pain).   Yes [provider]  furosemide  (LASIX ) 20 MG tablet TAKE ONE TABLET BY MOUTH EVERY DAY Patient taking differently: Take 20 mg by mouth in the morning. 03/19/23  Yes Veludandi, Prashanthi, MD  Ipratropium-Albuterol (COMBIVENT) 20-100 MCG/ACT AERS respimat Inhale 2 puffs into the lungs every 6 (six) hours as needed for shortness of breath.   Yes [provider]  levothyroxine  (  SYNTHROID ) 100 MCG tablet TAKE ONE TABLET BY MOUTH EVERY DAY IN THE MORNING 09/18/23  Yes Tye Gall, MD  lidocaine  (HM LIDOCAINE  PATCH) 4 % Place 1 patch onto the skin in the morning. Apply topically to L shoulder in the morning. Apply for 12 hours (0900) then removed for 12 hours (2100).   Yes [provider]  Multiple Vitamin (MULTIVITAMIN ADULT) TABS Take 1 tablet by mouth in the morning.   Yes [provider]  Multiple Vitamins-Minerals (OCUVITE PO) Take 1 capsule by mouth in the morning.   Yes [provider]  NAMENDA  10 MG tablet TAKE ONE TABLET BY MOUTH TWICE A DAY FOR COGNITIVE DECLINE Patient taking differently: Take 10 mg by mouth 2 (two) times daily. 04/23/23  Yes Veludandi, Prashanthi, MD  nystatin cream (MYCOSTATIN) Apply 1 Application topically every 12 (twelve) hours as needed (for rash).   Yes [provider]  pantoprazole (PROTONIX) 40 MG tablet Take 40 mg by mouth daily as needed (for indigestion).   Yes [provider]  Polyethyl Glyc-Propyl Glyc PF (SYSTANE HYDRATION PF) 0.4-0.3  % SOLN Place 1 drop into the right eye in the morning, at noon, in the evening, and at bedtime.   Yes [provider]  potassium chloride  (KLOR-CON  M) 10 MEQ tablet Take 1 tablet (10 mEq total) by mouth daily. Patient taking differently: Take 10 mEq by mouth in the morning. 06/28/23  Yes Tye Gall, MD    Physical Exam: BP 102/86   Pulse 60   Temp 98 F (36.7 C)   Resp 14   Ht 5\' 5"  (1.651 m)   Wt 88.5 kg   SpO2 99%   BMI 32.45 kg/m  General: Pleasant, chronically ill elderly woman laying in bed. No acute distress. HEENT: [place]/AT. Anicteric sclera CV: Regular rate. Irregularly irregular rhythm. No murmurs, rubs, or gallops. No LE edema Pulmonary: Lungs CTAB. Normal effort. No wheezing or rales. Abdominal: Soft, nontender, nondistended. Normal bowel sounds. MSK: Mild tenderness to palpation of the right hip and low back. Neurovascular intact distally. Skin: Warm and dry. No obvious rash or lesions. Neuro: A&Ox3. Poor short-term memory. Unable to lift the RLE due to pain. Normal sensation to light touch. Able to wiggle all toes. Psych: Normal mood and affect          Labs on Admission:  Basic Metabolic Panel: Recent Labs  Lab 11/06/23 1652  NA 138  K 3.9  CL 99  CO2 26  GLUCOSE 124*  BUN 18  CREATININE 0.93  CALCIUM  9.6   Liver Function Tests: No results for input(s): "AST", "ALT", "ALKPHOS", "BILITOT", "PROT", "ALBUMIN" in the last 168 hours. No results for input(s): "LIPASE", "AMYLASE" in the last 168 hours. No results for input(s): "AMMONIA" in the last 168 hours. CBC: Recent Labs  Lab 11/06/23 1652  WBC 7.2  NEUTROABS 5.2  HGB 14.7  HCT 44.1  MCV 100.7*  PLT 127*   Cardiac Enzymes: No results for input(s): "CKTOTAL", "CKMB", "CKMBINDEX", "TROPONINI" in the last 168 hours. BNP (last 3 results) Recent Labs    09/13/23 2041  BNP 376.4*    ProBNP (last 3 results) No results for input(s): "PROBNP" in the last 8760 hours.  CBG: No  results for input(s): "GLUCAP" in the last 168 hours.  Radiological Exams on Admission: CT HEAD WO CONTRAST Result Date: 11/06/2023 CLINICAL DATA:  Head trauma, mechanical fall. EXAM: CT HEAD WITHOUT CONTRAST CT CERVICAL SPINE WITHOUT CONTRAST TECHNIQUE: Multidetector CT imaging of the head and  cervical spine was performed following the standard protocol without intravenous contrast. Multiplanar CT image reconstructions of the cervical spine were also generated. RADIATION DOSE REDUCTION: This exam was performed according to the departmental dose-optimization program which includes automated exposure control, adjustment of the mA and/or kV according to patient size and/or use of iterative reconstruction technique. COMPARISON:  CT head 09/13/2023, CT cervical spine 09/25/2020. FINDINGS: CT HEAD FINDINGS Brain: No acute intracranial hemorrhage. No CT evidence of acute infarct. Nonspecific hypoattenuation in the periventricular and subcortical white matter favored to reflect chronic microvascular ischemic changes. Generalized parenchymal volume loss. No edema, mass effect, or midline shift. The basilar cisterns are patent. Ventricles: The ventricles are normal. Vascular: Atherosclerotic calcifications of the carotid siphons. No hyperdense vessel. Skull: No acute or aggressive finding. Orbits: Orbits are symmetric. Sinuses: Complete opacification of the left maxillary sinus. Mild mucosal thickening in the ethmoid sinuses and right sphenoid sinus. Other: Mastoid air cells are clear. CT CERVICAL SPINE FINDINGS Alignment: Straightening of the normal cervical lordosis. No significant listhesis. No facet subluxation or dislocation. Skull base and vertebrae: No acute fracture. No primary bone lesion or focal pathologic process. Soft tissues and spinal canal: No prevertebral fluid or swelling. No visible canal hematoma. Disc levels: Intervertebral disc space narrowing at multiple levels most pronounced at C4-5. Disc  osteophyte complexes at multiple levels. Disc bulge and thickening of the ligamentum flavum at C3-4 resulting in moderate spinal canal stenosis. Additional disc osteophyte complexes at C4-5 and C5-6 contributing to moderate spinal canal stenosis. Facet arthrosis and uncovertebral hypertrophy at multiple levels. Foraminal narrowing most pronounced on the left at C6-7. Upper chest: Negative. Other: None. IMPRESSION: No CT evidence of acute intracranial abnormality. No acute fracture or traumatic malalignment of the cervical spine. Degenerative changes as above. Moderate chronic microvascular ischemic changes and generalized parenchymal volume loss. Electronically Signed   By: Denny Flack M.D.   On: 11/06/2023 18:25   CT CERVICAL SPINE WO CONTRAST Result Date: 11/06/2023 CLINICAL DATA:  Head trauma, mechanical fall. EXAM: CT HEAD WITHOUT CONTRAST CT CERVICAL SPINE WITHOUT CONTRAST TECHNIQUE: Multidetector CT imaging of the head and cervical spine was performed following the standard protocol without intravenous contrast. Multiplanar CT image reconstructions of the cervical spine were also generated. RADIATION DOSE REDUCTION: This exam was performed according to the departmental dose-optimization program which includes automated exposure control, adjustment of the mA and/or kV according to patient size and/or use of iterative reconstruction technique. COMPARISON:  CT head 09/13/2023, CT cervical spine 09/25/2020. FINDINGS: CT HEAD FINDINGS Brain: No acute intracranial hemorrhage. No CT evidence of acute infarct. Nonspecific hypoattenuation in the periventricular and subcortical white matter favored to reflect chronic microvascular ischemic changes. Generalized parenchymal volume loss. No edema, mass effect, or midline shift. The basilar cisterns are patent. Ventricles: The ventricles are normal. Vascular: Atherosclerotic calcifications of the carotid siphons. No hyperdense vessel. Skull: No acute or aggressive  finding. Orbits: Orbits are symmetric. Sinuses: Complete opacification of the left maxillary sinus. Mild mucosal thickening in the ethmoid sinuses and right sphenoid sinus. Other: Mastoid air cells are clear. CT CERVICAL SPINE FINDINGS Alignment: Straightening of the normal cervical lordosis. No significant listhesis. No facet subluxation or dislocation. Skull base and vertebrae: No acute fracture. No primary bone lesion or focal pathologic process. Soft tissues and spinal canal: No prevertebral fluid or swelling. No visible canal hematoma. Disc levels: Intervertebral disc space narrowing at multiple levels most pronounced at C4-5. Disc osteophyte complexes at multiple levels. Disc bulge and thickening of the ligamentum  flavum at C3-4 resulting in moderate spinal canal stenosis. Additional disc osteophyte complexes at C4-5 and C5-6 contributing to moderate spinal canal stenosis. Facet arthrosis and uncovertebral hypertrophy at multiple levels. Foraminal narrowing most pronounced on the left at C6-7. Upper chest: Negative. Other: None. IMPRESSION: No CT evidence of acute intracranial abnormality. No acute fracture or traumatic malalignment of the cervical spine. Degenerative changes as above. Moderate chronic microvascular ischemic changes and generalized parenchymal volume loss. Electronically Signed   By: Denny Flack M.D.   On: 11/06/2023 18:25   CT PELVIS WO CONTRAST Result Date: 11/06/2023 CLINICAL DATA:  Hip trauma, fracture suspected, xray done Right hip pain. EXAM: CT OF THE PELVIS WITHOUT CONTRAST TECHNIQUE: Multidetector CT imaging of the pelvis was performed according to the standard protocol. Multiplanar CT image reconstructions were also generated. RADIATION DOSE REDUCTION: This exam was performed according to the departmental dose-optimization program which includes automated exposure control, adjustment of the mA and/or kV according to patient size and/or use of iterative reconstruction technique.  COMPARISON:  Radiograph earlier today FINDINGS: Bones/Joint/Cartilage Comminuted and displaced proximal femur fracture. There is a displaced component involving the lesser trochanter. There is a transverse subtrochanteric component. Fracture extends to involve the base of the femoral neck. No hip dislocation, the femoral head remains seated. Intact pubic rami. No sacral fracture. Minimal right hip joint effusion. Ligaments Suboptimally assessed by CT. Muscles and Tendons Mild edema in the skill a chair today shin to the fracture. No muscle atrophy. Soft tissues Colonic diverticulosis without diverticulitis. Stable 3.7 cm cystic structure in the left adnexa adjacent to the left aspect of the vaginal cuff from 2023 hip CT, likely benign given stability. There is a fat containing umbilical hernia. Aortic atherosclerosis. IMPRESSION: Comminuted and displaced proximal right femur fracture with a displaced component involving the lesser trochanter. There is a transverse subtrochanteric component. Fracture extends to involve the base of the femoral neck. Aortic Atherosclerosis (ICD10-I70.0). Electronically Signed   By: Chadwick Colonel M.D.   On: 11/06/2023 18:19   DG Hip Unilat With Pelvis 2-3 Views Right Result Date: 11/06/2023 CLINICAL DATA:  fall EXAM: DG HIP (WITH OR WITHOUT PELVIS) 2-3V RIGHT COMPARISON:  None Available. FINDINGS: Acute comminuted and markedly displaced right intertrochanteric femoral fracture. No right hip dislocation. Query slight irregularity of the left femoral neck. Poorly visualized left hip- limited evaluation due to overlapping osseous structures and overlying soft tissues. view. No acute displaced fracture or diastasis of the bones of the pelvis. No acute displaced fractured diastasis of the bones of the pelvis. There is no evidence of severe arthropathy or other focal bone abnormality. IMPRESSION: Acute comminuted and markedly displaced right intertrochanteric femoral fracture. Poorly  visualized left hip- limited evaluation due to overlapping osseous structures and overlying soft tissues. Query slight irregularity of the left femoral neck. Recommend dedicated three view radiograph of the left hip for further evaluation. Electronically Signed   By: Morgane  Naveau M.D.   On: 11/06/2023 17:48   DG Chest Port 1 View Result Date: 11/06/2023 CLINICAL DATA:  fall EXAM: PORTABLE CHEST 1 VIEW COMPARISON:  Chest x-[place] 09/13/2023 FINDINGS: Left chest wall dual lead pacemaker. The heart and mediastinal contours are unchanged. Atherosclerotic plaque. Left base atelectasis. No focal consolidation. Chronic coarsened interstitial markings with no overt pulmonary edema. No pleural effusion. No pneumothorax. No acute osseous abnormality. IMPRESSION: 1. No active disease. 2.  Aortic Atherosclerosis (ICD10-I70.0). Electronically Signed   By: Morgane  Naveau M.D.   On: 11/06/2023 17:43   Assessment/Plan Tanya Fantasia  B Friese is a 89 y.o. female with medical history significant for A-fib on Eliquis , diastolic heart failure, aortic stenosis, tachybrady syndrome and sick sinus dysfunction s/p pacemaker, GERD, senile dementia, HTN and hypothyroidism who presented from assisted living facility for evaluation after fall and found to have right femur fracture.  # Mechanical fall # Right femur fracture -Elderly patient presented from ALF after sustaining right hip pain from a mechanical fall -Hip imaging shows closed displaced intertrochanteric fracture of the right femur -Orthopedic surgery consulted, plan for intramedullary nailing of the femur tomorrow at 1530 -Multimodal pain control with scheduled Tylenol , lidocaine  patch, as needed Norco and IV Dilaudid -Follow-up vitamin D levels -N.p.o. at midnight, IV NS 75 cc/h while n.p.o. -Fall precautions  # A-fib # Sinus node dysfunction s/p PPM -HR stable in the 60s -Continue amiodarone  -Hold Eliquis  and resume after surgery  # Diastolic heart failure # Aortic  stenosis -Recent TTE from 09/14/2023 showed hyperdynamic EF >75%, severe LVH, G2DD, moderately dilated LA and moderate aortic stenosis. -No evidence of fluid overloaded on CXR -Patient slightly dry on exam -Resume home Lasix  tomorrow -Continue potassium supplementation  # Hypothyroidism -Continue Synthroid  -Follow-up TSH  # Dementia -Alert and oriented x 3 but has poor memory -Continue memantine  -Check vitamin B12   DVT prophylaxis: SCDs    Code Status: Limited: Do not attempt resuscitation (DNR) -DNR-LIMITED -Do Not Intubate/DNI   Consults called: Orthopedic surgery  Family Communication: Discussed admission with daughters at bedside  Severity of Illness: The appropriate patient status for this patient is INPATIENT. Inpatient status is judged to be reasonable and necessary in order to provide the required intensity of service to ensure the patient's safety. The patient's presenting symptoms, physical exam findings, and initial radiographic and laboratory data in the context of their chronic comorbidities is felt to place them at high risk for further clinical deterioration. Furthermore, it is not anticipated that the patient will be medically stable for discharge from the hospital within 2 midnights of admission.   * I certify that at the point of admission it is my clinical judgment that the patient will require inpatient hospital care spanning beyond 2 midnights from the point of admission due to high intensity of service, high risk for further deterioration and high frequency of surveillance required.*  Level of care: Telemetry Medical   This record has been created using Conservation officer, historic buildings. Errors have been sought and corrected, but may not always be located. Such creation errors do not reflect on the standard of care.   Vita Grip, MD 11/06/2023, 8:36 PM [facility] Pager: [phone] Isaiah 41:10   If 7PM-7AM, please contact  night-coverage [email or web address] Password [email or web address]

## 2023-11-06 NOTE — ED Triage Notes (Signed)
 Pt presents to ED via EMS from [facility]. Pt had mechanical fall and now c/o right hip pain. Pt denies hitting head or loc. EMS gave 100mcg fentanyl . EMS noticied shortening of right leg but no obvious rotation. Pelvic binder in place. 20G R hand  200/70 70 heart rate 96% RA 19 RR 28 cap

## 2023-11-06 NOTE — ED Notes (Signed)
 Pt family to nurse's desk stating pt has been receiving pain meds q34min and it's time for her next dose.  Dr. Kermit Ped to bedside and aware of request, pt repositioned by Dr. Kermit Ped and pt states feels better than she did 15 minutes ago.

## 2023-11-07 ENCOUNTER — Inpatient Hospital Stay (HOSPITAL_COMMUNITY): Admitting: Anesthesiology

## 2023-11-07 ENCOUNTER — Encounter (HOSPITAL_COMMUNITY)
Admission: EM | Disposition: A | Discharge status: Skilled Nursing Facility | Payer: Self-pay | Source: Skilled Nursing Facility | Attending: Internal Medicine

## 2023-11-07 ENCOUNTER — Encounter (HOSPITAL_COMMUNITY): Payer: Self-pay | Admitting: Student

## 2023-11-07 ENCOUNTER — Inpatient Hospital Stay (HOSPITAL_COMMUNITY)

## 2023-11-07 DIAGNOSIS — I130 Hypertensive heart and chronic kidney disease with heart failure and stage 1 through stage 4 chronic kidney disease, or unspecified chronic kidney disease: Secondary | ICD-10-CM

## 2023-11-07 DIAGNOSIS — D696 Thrombocytopenia, unspecified: Secondary | ICD-10-CM | POA: Diagnosis not present

## 2023-11-07 DIAGNOSIS — N183 Chronic kidney disease, stage 3 unspecified: Secondary | ICD-10-CM

## 2023-11-07 DIAGNOSIS — I480 Paroxysmal atrial fibrillation: Secondary | ICD-10-CM | POA: Diagnosis not present

## 2023-11-07 DIAGNOSIS — S72141A Displaced intertrochanteric fracture of right femur, initial encounter for closed fracture: Secondary | ICD-10-CM | POA: Diagnosis not present

## 2023-11-07 DIAGNOSIS — I509 Heart failure, unspecified: Secondary | ICD-10-CM

## 2023-11-07 HISTORY — PX: FEMUR IM NAIL: SHX1597

## 2023-11-07 LAB — VITAMIN D 25 HYDROXY (VIT D DEFICIENCY, FRACTURES): Vit D, 25-Hydroxy: 54.75 ng/mL (ref 30–100)

## 2023-11-07 LAB — COMPREHENSIVE METABOLIC PANEL WITH GFR
ALT: 19 U/L (ref 0–44)
AST: 27 U/L (ref 15–41)
Albumin: 3.4 g/dL — ABNORMAL LOW (ref 3.5–5.0)
Alkaline Phosphatase: 33 U/L — ABNORMAL LOW (ref 38–126)
Anion gap: 8 (ref 5–15)
BUN: 19 mg/dL (ref 8–23)
CO2: 25 mmol/L (ref 22–32)
Calcium: 8.9 mg/dL (ref 8.9–10.3)
Chloride: 105 mmol/L (ref 98–111)
Creatinine, Ser: 0.77 mg/dL (ref 0.44–1.00)
GFR, Estimated: >60 mL/min (ref >60)
Glucose, Bld: 111 mg/dL — ABNORMAL HIGH (ref 70–99)
Potassium: 3.6 mmol/L (ref 3.5–5.1)
Sodium: 138 mmol/L (ref 135–145)
Total Bilirubin: 1.1 mg/dL (ref 0.0–1.2)
Total Protein: 6.4 g/dL — ABNORMAL LOW (ref 6.5–8.1)

## 2023-11-07 LAB — TSH: TSH: 4.206 u[IU]/mL (ref 0.350–4.500)

## 2023-11-07 LAB — CBC
HCT: 39.6 % (ref 36.0–46.0)
Hemoglobin: 13.1 g/dL (ref 12.0–15.0)
MCH: 33.3 pg (ref 26.0–34.0)
MCHC: 33.1 g/dL (ref 30.0–36.0)
MCV: 100.8 fL — ABNORMAL HIGH (ref 80.0–100.0)
Platelets: 116 10*3/uL — ABNORMAL LOW (ref 150–400)
RBC: 3.93 MIL/uL (ref 3.87–5.11)
RDW: 13.2 % (ref 11.5–15.5)
WBC: 8.2 10*3/uL (ref 4.0–10.5)
nRBC: 0 % (ref 0.0–0.2)

## 2023-11-07 LAB — SURGICAL PCR SCREEN
MRSA, PCR: DETECTED — AB
Staphylococcus aureus: POSITIVE — AB

## 2023-11-07 LAB — VITAMIN B12: Vitamin B-12: 312 pg/mL (ref 180–914)

## 2023-11-07 SURGERY — INSERTION, INTRAMEDULLARY ROD, FEMUR, RETROGRADE
Anesthesia: General | Site: Leg Upper | Laterality: Right

## 2023-11-07 MED ORDER — CEFAZOLIN SODIUM-DEXTROSE 2-4 GM/100ML-% IV SOLN
INTRAVENOUS | Status: AC
  Filled 2023-11-07: qty 100

## 2023-11-07 MED ORDER — CEFAZOLIN SODIUM-DEXTROSE 2-4 GM/100ML-% IV SOLN
2.0000 g | Freq: Three times a day (TID) | INTRAVENOUS | Status: AC
  Administered 2023-11-08 (×2): 2 g via INTRAVENOUS
  Filled 2023-11-07 (×2): qty 100

## 2023-11-07 MED ORDER — PROPOFOL 10 MG/ML IV BOLUS
INTRAVENOUS | Status: DC | PRN
  Administered 2023-11-07: 90 ug via INTRAVENOUS

## 2023-11-07 MED ORDER — SUCCINYLCHOLINE CHLORIDE 200 MG/10ML IV SOSY
PREFILLED_SYRINGE | INTRAVENOUS | Status: AC
Start: 2023-11-07 — End: ?
  Filled 2023-11-07: qty 10

## 2023-11-07 MED ORDER — MUPIROCIN 2 % EX OINT
1.0000 | TOPICAL_OINTMENT | Freq: Two times a day (BID) | CUTANEOUS | Status: DC
Start: 2023-11-07 — End: 2023-11-12
  Administered 2023-11-07 – 2023-11-11 (×9): 1 via NASAL
  Filled 2023-11-07 (×3): qty 22

## 2023-11-07 MED ORDER — CHLORHEXIDINE GLUCONATE 0.12 % MT SOLN: 15.0000 mL | Freq: Once | OROMUCOSAL | Status: AC

## 2023-11-07 MED ORDER — LACTATED RINGERS IV SOLN: INTRAVENOUS | Status: DC

## 2023-11-07 MED ORDER — PROPOFOL 10 MG/ML IV BOLUS
INTRAVENOUS | Status: AC
  Filled 2023-11-07: qty 20

## 2023-11-07 MED ORDER — FENTANYL CITRATE (PF) 250 MCG/5ML IJ SOLN
INTRAMUSCULAR | Status: DC | PRN
  Administered 2023-11-07: 25 ug via INTRAVENOUS
  Administered 2023-11-07: 100 ug via INTRAVENOUS

## 2023-11-07 MED ORDER — ASPIRIN 325 MG PO TABS: 325.0000 mg | ORAL_TABLET | Freq: Every day | ORAL | Status: DC

## 2023-11-07 MED ORDER — FENTANYL CITRATE (PF) 100 MCG/2ML IJ SOLN
INTRAMUSCULAR | Status: AC
  Filled 2023-11-07: qty 2

## 2023-11-07 MED ORDER — PHENYLEPHRINE 80 MCG/ML (10ML) SYRINGE FOR IV PUSH (FOR BLOOD PRESSURE SUPPORT)
PREFILLED_SYRINGE | INTRAVENOUS | Status: DC | PRN
  Administered 2023-11-07 (×2): 80 ug via INTRAVENOUS

## 2023-11-07 MED ORDER — ORAL CARE MOUTH RINSE: 15.0000 mL | Freq: Once | OROMUCOSAL | Status: AC

## 2023-11-07 MED ORDER — PHENYLEPHRINE HCL (PRESSORS) 10 MG/ML IV SOLN
INTRAVENOUS | Status: DC | PRN
  Administered 2023-11-07: 80 ug via INTRAVENOUS

## 2023-11-07 MED ORDER — 0.9 % SODIUM CHLORIDE (POUR BTL) OPTIME
TOPICAL | Status: DC | PRN
  Administered 2023-11-07: 1000 mL

## 2023-11-07 MED ORDER — DEXAMETHASONE SODIUM PHOSPHATE 10 MG/ML IJ SOLN
INTRAMUSCULAR | Status: DC | PRN
  Administered 2023-11-07: 10 mg via INTRAVENOUS

## 2023-11-07 MED ORDER — SUCCINYLCHOLINE CHLORIDE 200 MG/10ML IV SOSY
PREFILLED_SYRINGE | INTRAVENOUS | Status: DC | PRN
  Administered 2023-11-07: 120 mg via INTRAVENOUS

## 2023-11-07 MED ORDER — CHLORHEXIDINE GLUCONATE CLOTH 2 % EX PADS
6.0000 | MEDICATED_PAD | Freq: Every day | CUTANEOUS | Status: AC
  Administered 2023-11-08 – 2023-11-12 (×5): 6 via TOPICAL

## 2023-11-07 MED ORDER — CHLORHEXIDINE GLUCONATE 0.12 % MT SOLN
OROMUCOSAL | Status: AC
  Administered 2023-11-07: 15 mL via OROMUCOSAL
  Filled 2023-11-07: qty 15

## 2023-11-07 MED ORDER — FENTANYL CITRATE (PF) 250 MCG/5ML IJ SOLN
INTRAMUSCULAR | Status: AC
  Filled 2023-11-07: qty 5

## 2023-11-07 MED ORDER — CEFAZOLIN SODIUM-DEXTROSE 2-4 GM/100ML-% IV SOLN
2.0000 g | Freq: Once | INTRAVENOUS | Status: AC
  Administered 2023-11-07: 2 g via INTRAVENOUS

## 2023-11-07 MED ORDER — ONDANSETRON HCL 4 MG/2ML IJ SOLN
INTRAMUSCULAR | Status: DC | PRN
  Administered 2023-11-07: 4 mg via INTRAVENOUS

## 2023-11-07 MED ORDER — FENTANYL CITRATE (PF) 100 MCG/2ML IJ SOLN
25.0000 ug | INTRAMUSCULAR | Status: DC | PRN
  Administered 2023-11-07 (×2): 25 ug via INTRAVENOUS

## 2023-11-07 MED ORDER — LIDOCAINE 2% (20 MG/ML) 5 ML SYRINGE
INTRAMUSCULAR | Status: AC
  Filled 2023-11-07: qty 5

## 2023-11-07 SURGICAL SUPPLY — 37 items
BAG COUNTER SPONGE SURGICOUNT (BAG) ×1 IMPLANT
BIT DRILL 4.3MMS DISTAL GRDTED (BIT) IMPLANT
BNDG COHESIVE 4X5 TAN STRL LF (GAUZE/BANDAGES/DRESSINGS) ×1 IMPLANT
COVER PERINEAL POST (MISCELLANEOUS) ×1 IMPLANT
COVER SURGICAL LIGHT HANDLE (MISCELLANEOUS) ×1 IMPLANT
DRAPE STERI IOBAN 125X83 (DRAPES) ×1 IMPLANT
DRESSING MEPILEX FLEX 4X4 (GAUZE/BANDAGES/DRESSINGS) IMPLANT
DRSG MEPILEX POST OP 4X8 (GAUZE/BANDAGES/DRESSINGS) ×1 IMPLANT
DURAPREP 26ML APPLICATOR (WOUND CARE) ×1 IMPLANT
ELECTRODE REM PT RTRN 9FT ADLT (ELECTROSURGICAL) ×1 IMPLANT
FACESHIELD WRAPAROUND (MASK) IMPLANT
FACESHIELD WRAPAROUND OR TEAM (MASK) ×1 IMPLANT
GAUZE PAD ABD 8X10 STRL (GAUZE/BANDAGES/DRESSINGS) ×2 IMPLANT
GAUZE XEROFORM 5X9 LF (GAUZE/BANDAGES/DRESSINGS) ×1 IMPLANT
GLOVE BIO SURGEON STRL SZ8 (GLOVE) ×1 IMPLANT
GLOVE BIOGEL PI IND STRL 8 (GLOVE) ×1 IMPLANT
GLOVE ORTHO TXT STRL SZ7.5 (GLOVE) ×1 IMPLANT
GOWN STRL REUS W/ TWL LRG LVL3 (GOWN DISPOSABLE) ×1 IMPLANT
GOWN STRL REUS W/ TWL XL LVL3 (GOWN DISPOSABLE) ×2 IMPLANT
GUIDEPIN VERSANAIL DSP 3.2X444 (ORTHOPEDIC DISPOSABLE SUPPLIES) IMPLANT
GUIDEWIRE BALL NOSE 100CM (WIRE) IMPLANT
KIT BASIN OR (CUSTOM PROCEDURE TRAY) ×1 IMPLANT
KIT TURNOVER KIT B (KITS) ×1 IMPLANT
MANIFOLD NEPTUNE II (INSTRUMENTS) ×1 IMPLANT
NAIL IM AFFIXUS HIP 9X360 125D (Nail) IMPLANT
NS IRRIG 1000ML POUR BTL (IV SOLUTION) ×1 IMPLANT
PACK GENERAL/GYN (CUSTOM PROCEDURE TRAY) ×1 IMPLANT
PAD ARMBOARD POSITIONER FOAM (MISCELLANEOUS) ×2 IMPLANT
PAD CAST 4YDX4 CTTN HI CHSV (CAST SUPPLIES) ×2 IMPLANT
SCREW BONE CORTICAL 5.0X44 (Screw) IMPLANT
SCREW CANN THRD AFF 10.5X100 (Screw) IMPLANT
STAPLER SKIN PROX 35W (STAPLE) ×1 IMPLANT
SUT VIC AB 0 CT1 27XBRD ANBCTR (SUTURE) ×2 IMPLANT
SUT VIC AB 2-0 CT1 TAPERPNT 27 (SUTURE) ×2 IMPLANT
TOWEL GREEN STERILE (TOWEL DISPOSABLE) ×1 IMPLANT
TOWEL GREEN STERILE FF (TOWEL DISPOSABLE) ×1 IMPLANT
WATER STERILE IRR 1000ML POUR (IV SOLUTION) ×1 IMPLANT

## 2023-11-07 NOTE — Brief Op Note (Signed)
   Brief Op Note  Date of Surgery: 11/07/2023  Preoperative Diagnosis: Hip fracture  Postoperative Diagnosis: same  Procedure: Procedure(s): INSERTION, INTRAMEDULLARY ROD, FEMUR, RETROGRADE  Implants: Implant Name Type Inv. Item Serial No. Manufacturer Lot No. LRB No. Used Action  NAIL IM AFFIXUS HIP [id number] 125D - [id number] Nail NAIL IM AFFIXUS HIP [id number] 125D  [employer](ORTH,TRAU,BIO,SG) [id number] Right 1 Implanted  SCREW CANN THRD AFF 10.5X100 - [id number] Screw SCREW CANN THRD AFF 10.5X100  [employer](ORTH,TRAU,BIO,SG) [id number] Right 1 Implanted  SCREW BONE CORTICAL 5.0X44 - [id number] Screw SCREW BONE CORTICAL 5.0X44  [employer]IM[employer](ORTH,TRAU,BIO,SG) [id number] Right 1 Implanted    Surgeons: Surgeon(s): Wilhelmenia Harada, MD  Anesthesia: General    Estimated Blood Loss: See anesthesia record  Complications: None  Condition to PACU: Stable  Carmina Chris, MD 11/07/2023 6:50 PM

## 2023-11-07 NOTE — Op Note (Signed)
 Date of Surgery: 11/07/2023  INDICATIONS: [name] is a 89 y.o.-year-old female with right hip intertrochanteric fracture.  The risk and benefits of the procedure were discussed in detail and documented in the pre-operative evaluation.   PREOPERATIVE DIAGNOSIS: 1. Right hip intertrochanteric fracture  POSTOPERATIVE DIAGNOSIS: Same.  PROCEDURE: 1. Right hip cephulomedullary nail  SURGEON: Carmina Chris MD  ASSISTANT: Deon Flatter, ATC  ANESTHESIA:  general  IV FLUIDS AND URINE: See anesthesia record.  ANTIBIOTICS: Ancef  ESTIMATED BLOOD LOSS: 25 mL.  IMPLANTS:  Implant Name Type Inv. Item Serial No. Manufacturer Lot No. LRB No. Used Action  NAIL IM AFFIXUS HIP 9X360 125D - [id number] Nail NAIL IM AFFIXUS HIP 9X360 125D  [employer](ORTH,TRAU,[employer],SG) [id number] Right 1 Implanted  SCREW CANN THRD AFF 10.5X100 - [id number] Screw SCREW CANN THRD AFF 10.5X100  [employer](ORTH,TRAU,BIO,SG) [id number] Right 1 Implanted  SCREW BONE CORTICAL 5.0X44 - [id number] Screw SCREW BONE CORTICAL 5.0X44  [employer](ORTH,TRAU,BIO,SG) [id number] Right 1 Implanted    DRAINS: None  CULTURES: None  COMPLICATIONS: none  DESCRIPTION OF PROCEDURE:   The patient's chart/medical history was reviewed and decision was made to administer peri-operative trans-exemic acid.  I have reviewed the patient's history and given the presence of a fragility fracture, I have deemed the necessity of a osteoporosis management referral or confirmed that the patient is currently enrolled in a osteoporosis treatment program.   The patient was identified in the preoperative holding area and the correct site was marked according universal protocol with nursing, and was subsequently taken back to the operating room.  Anesthesia was induced.  Antibiotics were given 1 hour prior to skin incision.  The patient was placed on the Hana table.  The traction post was placed.  Gross traction and internal rotation was able to provide  the best reduction.  The patient was subsequently prepped and draped in the usual sterile fashion.   Final timeout was performed.  A posterolateral approach to the greater trochanter was used.  This was done 3 cm proximal to the greater trochanter.  10 blade was used to incise through skin and IT band.  Blunt dissection was performed down to the level of the greater trochanter.  The pin was placed under direct fluoroscopic visualization at the center of the greater trochanter at the tip.  This was malleted into place down to the level of the lesser trochanter.  Opening reamer was then used.  A ball-tipped guidewire was then place to distal metaphyseal bone in the femur.  This was passed across the fracture site.  The ball wire was then measured and the appropriate size nail size long nail in 11mm was taken.  Size 12.5 reamer was used over the guidewire.  Nail was introduced.  The cephalomedullary wire was placed.  Again measurement was taken after confirming center center on the AP lateral fluoroscopy.  The appropriate size screw was then placed into the neck component of the femur.  The screw was tightened and then backed off one quarter term to allow for compression.  Distal interlocks were then placed.  This was done during perfect circle technique.  15 blade was used to incise through skin and IT band.  Drill was then used bicortically.  Screw sizes were measured and then 2 screws were placed both in static fashion.  The jig was removed.  The wounds were thoroughly irrigated.  Final fluoroscopy was confirmed good reduction on AP and laterals.  Wounds were closed in layers of 0 Vicryl  2-0 Vicryl and staples.  An Aquacel dressing was placed. All counts were correct at the end of the case. The patient was awoken and taken to the PACU without complication.      POSTOPERATIVE PLAN: She will be weightbearing as tolerated.  She will be seen by physical therapy while admitted.  She will be placed on aspirin  for  blood clot prevention.  Carmina Chris, MD 6:51 PM

## 2023-11-07 NOTE — Anesthesia Procedure Notes (Signed)
 Procedure Name: Intubation Date/Time: 11/07/2023 5:49 PM  Performed by: Johann Muta, CRNAPre-anesthesia Checklist: Patient identified, Emergency Drugs available, Suction available and Patient being monitored Patient Re-evaluated:Patient Re-evaluated prior to induction Oxygen Delivery Method: Circle System Utilized Preoxygenation: Pre-oxygenation with 100% oxygen Induction Type: IV induction Ventilation: Mask ventilation without difficulty Tube type: Oral Tube size: 7.0 mm Number of attempts: 1 Airway Equipment and Method: Stylet and Oral airway Placement Confirmation: ETT inserted through vocal cords under direct vision, positive ETCO2 and breath sounds checked- equal and bilateral Secured at: 21 cm Tube secured with: Tape Dental Injury: Teeth and Oropharynx as per pre-operative assessment

## 2023-11-07 NOTE — Interval H&P Note (Signed)
 History and Physical Interval Note:  11/07/2023 4:19 PM  [name]  has presented today for surgery, with the diagnosis of Hip fracture.  The various methods of treatment have been discussed with the patient and family. After consideration of risks, benefits and other options for treatment, the patient has consented to  Procedure(s): INSERTION, INTRAMEDULLARY ROD, FEMUR, RETROGRADE (Right) as a surgical intervention.  The patient's history has been reviewed, patient examined, no change in status, stable for surgery.  I have reviewed the patient's chart and labs.  Questions were answered to the patient's satisfaction.     Lendell Gallick

## 2023-11-07 NOTE — Anesthesia Preprocedure Evaluation (Signed)
 Anesthesia Evaluation  Patient identified by MRN, date of birth, ID band Patient awake    Reviewed: Allergy  & Precautions, NPO status , Patient's Chart, lab work & pertinent test results  History of Anesthesia Complications Negative for: history of anesthetic complications  Airway Mallampati: Unable to assess       Dental  (+) Dental Advisory Given, Edentulous Upper, Edentulous Lower   Pulmonary neg shortness of breath, neg sleep apnea, neg COPD, neg recent URI, former smoker   breath sounds clear to auscultation       Cardiovascular hypertension, +CHF  + dysrhythmias Atrial Fibrillation + pacemaker + Valvular Problems/Murmurs AS  Rhythm:Regular + Systolic murmurs 1. Turbulent flow through LV with near cavity obliteration during systole  . Left ventricular ejection fraction, by estimation, is >75%. The left  ventricle has hyperdynamic function. The left ventricle has no regional  wall motion abnormalities. There is  severe left ventricular hypertrophy. Left ventricular diastolic parameters  are consistent with Grade II diastolic dysfunction (pseudonormalization).  Elevated left atrial pressure.   2. Right ventricular systolic function is normal. The right ventricular  size is normal. There is normal pulmonary artery systolic pressure.   3. Left atrial size was moderately dilated.   4. Mild mitral valve regurgitation.   5. AV is thickened, calcified with restricted motion. Peak and mean  gradients through te valve are 62 and 35 mm Hg respectively AVA (VTI) is  o.74 cm2. Dimensionless index is 0.33 consistent with moderate AS.  Calculation limited by accuracy of LVOT  measurements 2 D imaging, AS appears moderate to severe. Compared to echo  report from April 2024, mean gradient is mildly increased (30 to 35 mm Hg)  and DVI is decreassed (0.33 to 0.3). Aaron Aas The aortic valve is tricuspid.  Aortic valve regurgitation is not   visualized.   6. The inferior vena cava is normal in size with greater than 50%  respiratory variability, suggesting right atrial pressure of 3 mmHg.     Neuro/Psych  PSYCHIATRIC DISORDERS Anxiety Depression   Dementia negative neurological ROS     GI/Hepatic ,GERD  Medicated and Controlled,,Lab Results      Component                Value               Date                      ALT                      19                  11/07/2023                AST                      27                  11/07/2023                ALKPHOS                  33 (L)              11/07/2023                BILITOT  1.1                 11/07/2023              Endo/Other  Hypothyroidism    Renal/GU Renal diseaseLab Results      Component                Value               Date                      NA                       138                 11/07/2023                K                        3.6                 11/07/2023                CO2                      25                  11/07/2023                GLUCOSE                  111 (H)             11/07/2023                BUN                      19                  11/07/2023                CREATININE               0.77                11/07/2023                CALCIUM                   8.9                 11/07/2023                GFR                      67.50               12/28/2014                EGFR                     69                  06/20/2023                GFRNONAA                 >60  11/07/2023                Musculoskeletal  (+) Arthritis ,    Abdominal   Peds  Hematology  (+) Blood dyscrasia Lab Results      Component                Value               Date                      WBC                      8.2                 11/07/2023                HGB                      13.1                11/07/2023                HCT                      39.6                11/07/2023                 MCV                      100.8 (H)           11/07/2023                PLT                      116 (L)             11/07/2023            Eliquis  last dose 5/28   Anesthesia Other Findings   Reproductive/Obstetrics                              Anesthesia Physical Anesthesia Plan  ASA: 3  Anesthesia Plan: General   Post-op Pain Management:    Induction: Intravenous  PONV Risk Score and Plan: 4 or greater and Ondansetron  and Dexamethasone  Airway Management Planned: Oral ETT  Additional Equipment: None  Intra-op Plan:   Post-operative Plan: Extubation in OR  Informed Consent: I have reviewed the patients History and Physical, chart, labs and discussed the procedure including the risks, benefits and alternatives for the proposed anesthesia with the patient or authorized representative who has indicated his/her understanding and acceptance.   Patient has DNR.  Discussed DNR with power of attorney and Suspend DNR.   Dental advisory given  Plan Discussed with: CRNA  Anesthesia Plan Comments:          Anesthesia Quick Evaluation

## 2023-11-07 NOTE — Discharge Instructions (Signed)
     Discharge Instructions    Attending Surgeon: Wilhelmenia Harada, MD Office Phone Number: [phone]   Diagnosis and Procedures:    Surgeries Performed: Right hip nailing  Discharge Plan:    Activity:  Weight bearing as tolerated. You are advised to go home directly from the hospital or surgical center. Restrict your activities.  GENERAL INSTRUCTIONS: 1.  Please apply ice to your wound to help with swelling and inflammation. This will improve your comfort and your overall recovery following surgery.     2. Please call Dr. Verline Glow office at [phone] with questions Monday-Friday during business hours. If no one answers, please leave a message and someone should get back to the patient within 24 hours. For emergencies please call 911 or proceed to the emergency room.   3. Patient to notify surgical team if experiences any of the following: Bowel/Bladder dysfunction, uncontrolled pain, nerve/muscle weakness, incision with increased drainage or redness, nausea/vomiting and Fever greater than 101.0 F.  Be alert for signs of infection including redness, streaking, odor, fever or chills. Be alert for excessive pain or bleeding and notify your surgeon immediately.  WOUND INSTRUCTIONS:   Leave your dressing, cast, or splint in place until your post operative visit.  Keep it clean and dry.  Always keep the incision clean and dry until the staples/sutures are removed. If there is no drainage from the incision you should keep it open to air. If there is drainage from the incision you must keep it covered at all times until the drainage stops  Do not soak in a bath tub, hot tub, pool, lake or other body of water until 21 days after your surgery and your incision is completely dry and healed.  If you have removable sutures (or staples) they must be removed 10-14 days (unless otherwise instructed) from the day of your surgery.     1)  Elevate the extremity as much as possible.  2)   Keep the dressing clean and dry.  3)  Please call us  if the dressing becomes wet or dirty.  4)  If you are experiencing worsening pain or worsening swelling, please call.

## 2023-11-07 NOTE — Progress Notes (Signed)
 PROGRESS NOTE    [name]  WUJ:[id number] DOB: 02-24-1928 DOA: 11/06/2023 PCP: Tye Gall, MD   Brief Narrative:  Patient is a 89 year old elderly Caucasian female with a past medical history significant for A-fib on anticoagulation, chronic diastolic CHF, history of aortic stenosis, history of tachybradycardia syndrome and sick sinus dysfunction status post pacemaker, GERD, Sendil dementia, essential hypertension, hypothyroidism as well as other comorbid's who presented from her ALF after a fall.  Patient was ambulating and leaving her bathroom and she tripped on the edge of the rug and fell on her right side.  Further workup was done including a trauma scan and CT pelvis showed an acute comminuted and markedly displaced right intratrochanteric femur fracture.  Orthopedic surgery was consulted and her anticoagulation was held and plan is for surgical intervention with intramedullary nail of the femur later this afternoon.  Assessment and Plan:  Mechanical fall with resultant Right Femur Fracture: Elderly patient presented from ALF after sustaining right hip pain from a mechanical fall. Hip imaging shows closed displaced intertrochanteric fracture of the right femur. Orthopedic surgery consulted, plan for intramedullary nailing of the femur 11/07/23 at 1530. C/w Multimodal pain control with scheduled Acetaminophen  1000 mg po q8h, Lidocaine  Patch 1 Patch TD q24h as well as Hydrocodone-Acetaminophen  5-325 1 tab po q6hprn Moderate Pain and given IV Fentanyl  50 mcg q30m minprn Severe Pain x3 doses -Follow-up vitamin D levels -N.p.o. at midnight, IV NS 75 cc/h while n.p.o. -Fall precautions -Bowel Regimen w/ Senna-Docusate 1 tab po qHSprn Mild Consitpation -WB Status to be deferred to Orthopedic Surgery as well as Resumption of Apixaban    Paroxysmal A-fib /  Sinus node dysfunction s/p PPM: HR stable in the 60s. C/w Amiodarone  200 mg po at bedtime; Holding Apixaban  for now and will resume  after Surgery once ok with Orthopedic Surgery   Chronic Diastolic Heart Failure Aortic stenosis -Recent TTE from 09/14/2023 showed hyperdynamic EF >75%, severe LVH, G2DD, moderately dilated LA and moderate aortic stenosis. -No evidence of fluid overloaded on CXR but Patient slightly dry on exam so was initiated on IVF with NS @ 75 mL/hr x1 Day -Initially held diuretics on Admission but Resumed Furosemide  20 mg po daily today  -Continue potassium supplementation w/ po KCL 10 mEQ Daily   Glaucoma: C/w Brimonidine  0.2% Opthalmic Solution in Right Eye q12h, Timolol  0.5% Ophthalmic solution 1 drop Right Eye q12h, and Polyvinal Alcohol 1.4% Ophthalmic 1 Drop in R Eye TID   Hypothyroidism: TSH was 4.206. C/w Levothyroxine  100 mcg po Daily    Dementia: Alert and oriented x 3 but has poor memory. C/w Memantine  10 mg po BID. Checked Vitamin B12 and was 312  Thrombocytopenia: Plt Count went from 127 -> 116. CTM for S/Sx of Bleeding; No overt bleeding noted.   Hypoalbuminemia: Patient's Albumin Level is 3.4. CTM and Trend and repeat CMP in the AM  Class I Obesity: Complicates overall prognosis and care. Estimated body mass index is 32.45 kg/m as calculated from the following:   Height as of this encounter: 5\' 5"  (1.651 m).   Weight as of this encounter: 88.5 kg. Weight Loss and Dietary Counseling given   DVT prophylaxis: SCDs Start: 11/06/23 1958    Code Status: Limited: Do not attempt resuscitation (DNR) -DNR-LIMITED -Do Not Intubate/DNI  Family Communication: D/w Family present @ Bedside  Disposition Plan:  Level of care: Telemetry Medical Status is: Inpatient Remains inpatient appropriate because: Surgical intervention will need evaluation by PT and OT to determine safe discharge  disposition   Consultants:  Orthopedic Surgery  Procedures:  As delineated as above  Antimicrobials:  Anti-infectives (From admission, onward)    None       Subjective: Seen and examined at bedside and  states that she has no pain when she is not moving.  Feels okay.  Daughter is asking for some tape to tape her right eyelid down given that it curls up and causes her irritation.  No chest pain or shortness of breath.  No other concerns or complaints at this time.  Objective: Vitals:   11/06/23 2042 11/07/23 0010 11/07/23 0531 11/07/23 0924  BP: 135/61 (!) 154/59 (!) 140/46 (!) 150/52  Pulse: (!) 59 68 61 65  Resp: 18 18 18 16   Temp: 98.1 F (36.7 C) 97.8 F (36.6 C) 97.8 F (36.6 C) 98.4 F (36.9 C)  TempSrc:      SpO2: 94% 95% 94% 92%  Weight:      Height:        Intake/Output Summary (Last 24 hours) at 11/07/2023 1235 Last data filed at 11/07/2023 1215 Gross per 24 hour  Intake --  Output 300 ml  Net -300 ml   Filed Weights   11/06/23 1559  Weight: 88.5 kg   Examination: Physical Exam:  Constitutional: WN/WD obese elderly Caucasian female in no acute distress and appears calm Respiratory: Diminished to auscultation bilaterally, no wheezing, rales, rhonchi or crackles. Normal respiratory effort and patient is not tachypenic. No accessory muscle use.  Unlabored breathing Cardiovascular: RRR, no murmurs / rubs / gallops. S1 and S2 auscultated. No extremity edema.  Abdomen: Soft, non-tender, distended secondary to body habitus. Bowel sounds positive.  GU: Deferred. Musculoskeletal: No clubbing / cyanosis of digits/nails.  Right leg is shorter and externally rotated compared to the left Skin: No rashes, lesions, ulcers on limited skin evaluation. No induration; Warm and dry.  Neurologic: CN 2-12 grossly intact with no focal deficits. Romberg sign and cerebellar reflexes not assessed.  Psychiatric: Normal judgment and insight. Alert and oriented x 3. Normal mood and appropriate affect.   Data Reviewed: I have personally reviewed following labs and imaging studies  CBC: Recent Labs  Lab 11/06/23 1652 11/07/23 0645  WBC 7.2 8.2  NEUTROABS 5.2  --   HGB 14.7 13.1  HCT  44.1 39.6  MCV 100.7* 100.8*  PLT 127* 116*   Basic Metabolic Panel: Recent Labs  Lab 11/06/23 1652 11/07/23 0645  NA 138 138  K 3.9 3.6  CL 99 105  CO2 26 25  GLUCOSE 124* 111*  BUN 18 19  CREATININE 0.93 0.77  CALCIUM  9.6 8.9   GFR: Estimated Creatinine Clearance: 47.2 mL/min (by C-G formula based on SCr of 0.77 mg/dL). Liver Function Tests: Recent Labs  Lab 11/07/23 0645  AST 27  ALT 19  ALKPHOS 33*  BILITOT 1.1  PROT 6.4*  ALBUMIN 3.4*   No results for input(s): "LIPASE", "AMYLASE" in the last 168 hours. No results for input(s): "AMMONIA" in the last 168 hours. Coagulation Profile: Recent Labs  Lab 11/06/23 1652  INR 1.2   Cardiac Enzymes: No results for input(s): "CKTOTAL", "CKMB", "CKMBINDEX", "TROPONINI" in the last 168 hours. BNP (last 3 results) No results for input(s): "PROBNP" in the last 8760 hours. HbA1C: No results for input(s): "HGBA1C" in the last 72 hours. CBG: No results for input(s): "GLUCAP" in the last 168 hours. Lipid Profile: No results for input(s): "CHOL", "HDL", "LDLCALC", "TRIG", "CHOLHDL", "LDLDIRECT" in the last 72 hours. Thyroid  Function Tests:  Recent Labs    11/07/23 0645  TSH 4.206   Anemia Panel: Recent Labs    11/07/23 0645  VITAMINB12 312   Sepsis Labs: No results for input(s): "PROCALCITON", "LATICACIDVEN" in the last 168 hours.  No results found for this or any previous visit (from the past 240 hours).   Radiology Studies: CT HEAD WO CONTRAST Result Date: 11/06/2023 CLINICAL DATA:  Head trauma, mechanical fall. EXAM: CT HEAD WITHOUT CONTRAST CT CERVICAL SPINE WITHOUT CONTRAST TECHNIQUE: Multidetector CT imaging of the head and cervical spine was performed following the standard protocol without intravenous contrast. Multiplanar CT image reconstructions of the cervical spine were also generated. RADIATION DOSE REDUCTION: This exam was performed according to the departmental dose-optimization program which includes  automated exposure control, adjustment of the mA and/or kV according to patient size and/or use of iterative reconstruction technique. COMPARISON:  CT head 09/13/2023, CT cervical spine 09/25/2020. FINDINGS: CT HEAD FINDINGS Brain: No acute intracranial hemorrhage. No CT evidence of acute infarct. Nonspecific hypoattenuation in the periventricular and subcortical white matter favored to reflect chronic microvascular ischemic changes. Generalized parenchymal volume loss. No edema, mass effect, or midline shift. The basilar cisterns are patent. Ventricles: The ventricles are normal. Vascular: Atherosclerotic calcifications of the carotid siphons. No hyperdense vessel. Skull: No acute or aggressive finding. Orbits: Orbits are symmetric. Sinuses: Complete opacification of the left maxillary sinus. Mild mucosal thickening in the ethmoid sinuses and right sphenoid sinus. Other: Mastoid air cells are clear. CT CERVICAL SPINE FINDINGS Alignment: Straightening of the normal cervical lordosis. No significant listhesis. No facet subluxation or dislocation. Skull base and vertebrae: No acute fracture. No primary bone lesion or focal pathologic process. Soft tissues and spinal canal: No prevertebral fluid or swelling. No visible canal hematoma. Disc levels: Intervertebral disc space narrowing at multiple levels most pronounced at C4-5. Disc osteophyte complexes at multiple levels. Disc bulge and thickening of the ligamentum flavum at C3-4 resulting in moderate spinal canal stenosis. Additional disc osteophyte complexes at C4-5 and C5-6 contributing to moderate spinal canal stenosis. Facet arthrosis and uncovertebral hypertrophy at multiple levels. Foraminal narrowing most pronounced on the left at C6-7. Upper chest: Negative. Other: None. IMPRESSION: No CT evidence of acute intracranial abnormality. No acute fracture or traumatic malalignment of the cervical spine. Degenerative changes as above. Moderate chronic microvascular  ischemic changes and generalized parenchymal volume loss. Electronically Signed   By: Denny Flack M.D.   On: 11/06/2023 18:25   CT CERVICAL SPINE WO CONTRAST Result Date: 11/06/2023 CLINICAL DATA:  Head trauma, mechanical fall. EXAM: CT HEAD WITHOUT CONTRAST CT CERVICAL SPINE WITHOUT CONTRAST TECHNIQUE: Multidetector CT imaging of the head and cervical spine was performed following the standard protocol without intravenous contrast. Multiplanar CT image reconstructions of the cervical spine were also generated. RADIATION DOSE REDUCTION: This exam was performed according to the departmental dose-optimization program which includes automated exposure control, adjustment of the mA and/or kV according to patient size and/or use of iterative reconstruction technique. COMPARISON:  CT head 09/13/2023, CT cervical spine 09/25/2020. FINDINGS: CT HEAD FINDINGS Brain: No acute intracranial hemorrhage. No CT evidence of acute infarct. Nonspecific hypoattenuation in the periventricular and subcortical white matter favored to reflect chronic microvascular ischemic changes. Generalized parenchymal volume loss. No edema, mass effect, or midline shift. The basilar cisterns are patent. Ventricles: The ventricles are normal. Vascular: Atherosclerotic calcifications of the carotid siphons. No hyperdense vessel. Skull: No acute or aggressive finding. Orbits: Orbits are symmetric. Sinuses: Complete opacification of the left maxillary sinus. Mild  mucosal thickening in the ethmoid sinuses and right sphenoid sinus. Other: Mastoid air cells are clear. CT CERVICAL SPINE FINDINGS Alignment: Straightening of the normal cervical lordosis. No significant listhesis. No facet subluxation or dislocation. Skull base and vertebrae: No acute fracture. No primary bone lesion or focal pathologic process. Soft tissues and spinal canal: No prevertebral fluid or swelling. No visible canal hematoma. Disc levels: Intervertebral disc space narrowing at  multiple levels most pronounced at C4-5. Disc osteophyte complexes at multiple levels. Disc bulge and thickening of the ligamentum flavum at C3-4 resulting in moderate spinal canal stenosis. Additional disc osteophyte complexes at C4-5 and C5-6 contributing to moderate spinal canal stenosis. Facet arthrosis and uncovertebral hypertrophy at multiple levels. Foraminal narrowing most pronounced on the left at C6-7. Upper chest: Negative. Other: None. IMPRESSION: No CT evidence of acute intracranial abnormality. No acute fracture or traumatic malalignment of the cervical spine. Degenerative changes as above. Moderate chronic microvascular ischemic changes and generalized parenchymal volume loss. Electronically Signed   By: Denny Flack M.D.   On: 11/06/2023 18:25   CT PELVIS WO CONTRAST Result Date: 11/06/2023 CLINICAL DATA:  Hip trauma, fracture suspected, xray done Right hip pain. EXAM: CT OF THE PELVIS WITHOUT CONTRAST TECHNIQUE: Multidetector CT imaging of the pelvis was performed according to the standard protocol. Multiplanar CT image reconstructions were also generated. RADIATION DOSE REDUCTION: This exam was performed according to the departmental dose-optimization program which includes automated exposure control, adjustment of the mA and/or kV according to patient size and/or use of iterative reconstruction technique. COMPARISON:  Radiograph earlier today FINDINGS: Bones/Joint/Cartilage Comminuted and displaced proximal femur fracture. There is a displaced component involving the lesser trochanter. There is a transverse subtrochanteric component. Fracture extends to involve the base of the femoral neck. No hip dislocation, the femoral head remains seated. Intact pubic rami. No sacral fracture. Minimal right hip joint effusion. Ligaments Suboptimally assessed by CT. Muscles and Tendons Mild edema in the skill a chair today shin to the fracture. No muscle atrophy. Soft tissues Colonic diverticulosis without  diverticulitis. Stable 3.7 cm cystic structure in the left adnexa adjacent to the left aspect of the vaginal cuff from 2023 hip CT, likely benign given stability. There is a fat containing umbilical hernia. Aortic atherosclerosis. IMPRESSION: Comminuted and displaced proximal right femur fracture with a displaced component involving the lesser trochanter. There is a transverse subtrochanteric component. Fracture extends to involve the base of the femoral neck. Aortic Atherosclerosis (ICD10-I70.0). Electronically Signed   By: Chadwick Colonel M.D.   On: 11/06/2023 18:19   DG Hip Unilat With Pelvis 2-3 Views Right Result Date: 11/06/2023 CLINICAL DATA:  fall EXAM: DG HIP (WITH OR WITHOUT PELVIS) 2-3V RIGHT COMPARISON:  None Available. FINDINGS: Acute comminuted and markedly displaced right intertrochanteric femoral fracture. No right hip dislocation. Query slight irregularity of the left femoral neck. Poorly visualized left hip- limited evaluation due to overlapping osseous structures and overlying soft tissues. view. No acute displaced fracture or diastasis of the bones of the pelvis. No acute displaced fractured diastasis of the bones of the pelvis. There is no evidence of severe arthropathy or other focal bone abnormality. IMPRESSION: Acute comminuted and markedly displaced right intertrochanteric femoral fracture. Poorly visualized left hip- limited evaluation due to overlapping osseous structures and overlying soft tissues. Query slight irregularity of the left femoral neck. Recommend dedicated three view radiograph of the left hip for further evaluation. Electronically Signed   By: Morgane  Naveau M.D.   On: 11/06/2023 17:48  DG Chest Port 1 View Result Date: 11/06/2023 CLINICAL DATA:  fall EXAM: PORTABLE CHEST 1 VIEW COMPARISON:  Chest x-[place] 09/13/2023 FINDINGS: Left chest wall dual lead pacemaker. The heart and mediastinal contours are unchanged. Atherosclerotic plaque. Left base atelectasis. No focal  consolidation. Chronic coarsened interstitial markings with no overt pulmonary edema. No pleural effusion. No pneumothorax. No acute osseous abnormality. IMPRESSION: 1. No active disease. 2.  Aortic Atherosclerosis (ICD10-I70.0). Electronically Signed   By: Morgane  Naveau M.D.   On: 11/06/2023 17:43   Scheduled Meds:  acetaminophen   1,000 mg Oral TID   amiodarone   200 mg Oral QHS   brimonidine   1 drop Right Eye Q12H   calcium -vitamin D  1 tablet Oral Q breakfast   furosemide   20 mg Oral QAC breakfast   levothyroxine   100 mcg Oral Q0600   lidocaine   1 patch Transdermal Q24H   memantine   10 mg Oral BID   multivitamin with minerals  1 tablet Oral Daily   polyvinyl alcohol  1 drop Right Eye TID   potassium chloride   10 mEq Oral Daily   timolol   1 drop Right Eye Q12H   Continuous Infusions:  sodium chloride  75 mL/hr at 11/06/23 2154    LOS: 1 day   Aura Leeds, DO [facility] Available via Epic secure chat 7am-7pm After these hours, please refer to coverage provider listed on [email or web address] 11/07/2023, 12:35 PM

## 2023-11-07 NOTE — TOC CAGE-AID Note (Signed)
 Transition of Care [facility]) - CAGE-AID Screening   Patient Details  Name: [name] MRN: [id number] Date of Birth: 17-Sep-1927  Transition of [facility]) CM/SW Contact:    Calla Wedekind E Daric Koren, LCSW Phone Number: 11/07/2023, 8:47 AM   Clinical Narrative:    CAGE-AID Screening:    Have You Ever Felt You Ought to Cut Down on Your Drinking or Drug Use?: No Have People Annoyed You By [employer] Your Drinking Or Drug Use?: No Have You Felt Bad Or Guilty About Your Drinking Or Drug Use?: No Have You Ever Had a Drink or Used Drugs First Thing In The Morning to Steady Your Nerves or to Get Rid of a Hangover?: No CAGE-AID Score: 0  Substance Abuse Education Offered: No

## 2023-11-07 NOTE — Transfer of Care (Signed)
 Immediate Anesthesia Transfer of Care Note  Patient: [name]  Procedure(s) Performed: INSERTION, INTRAMEDULLARY ROD, FEMUR, RETROGRADE (Right: Leg Upper)  Patient Location: PACU  Anesthesia Type:General  Level of Consciousness: awake, alert , and drowsy  Airway & Oxygen Therapy: Patient Spontanous Breathing and Patient connected to nasal cannula oxygen  Post-op Assessment: Report given to RN and Post -op Vital signs reviewed and stable  Post vital signs: Reviewed and stable  Last Vitals:  Vitals Value Taken Time  BP 174/55 11/07/23 1915  Temp 37.1 C 11/07/23 1914  Pulse 72 11/07/23 1926  Resp 18 11/07/23 1926  SpO2 93 % 11/07/23 1926  Vitals shown include unfiled device data.  Last Pain:  Vitals:   11/07/23 1914  TempSrc:   PainSc: 0-No pain         Complications: No notable events documented.

## 2023-11-07 NOTE — Anesthesia Postprocedure Evaluation (Signed)
 Anesthesia Post Note  Patient: [name]  Procedure(s) Performed: INSERTION, INTRAMEDULLARY ROD, FEMUR, RETROGRADE (Right: Leg Upper)     Patient location during evaluation: PACU Anesthesia Type: General Level of consciousness: awake and confused (at reported baseline) Pain management: pain level controlled Vital Signs Assessment: post-procedure vital signs reviewed and stable Respiratory status: spontaneous breathing, nonlabored ventilation, respiratory function stable and patient connected to nasal cannula oxygen Cardiovascular status: blood pressure returned to baseline and stable Postop Assessment: no apparent nausea or vomiting Anesthetic complications: no  No notable events documented.  Last Vitals:  Vitals:   11/07/23 2000 11/07/23 2021  BP: (!) 161/62 (!) 155/51  Pulse: 78 74  Resp: (!) 21 18  Temp: 36.8 C 36.9 C  SpO2: 98% 93%    Last Pain:  Vitals:   11/07/23 1915  TempSrc:   PainSc: 0-No pain                 Juventino Oppenheim

## 2023-11-07 NOTE — Plan of Care (Signed)

## 2023-11-07 NOTE — Hospital Course (Addendum)
 Patient is a 89 year old elderly Caucasian female with a past medical history significant for A-fib on anticoagulation, chronic diastolic CHF, history of aortic stenosis, history of tachybradycardia syndrome and sick sinus dysfunction status post pacemaker, GERD, Sendil dementia, essential hypertension, hypothyroidism as well as other comorbid's who presented from her ALF after a fall.  Patient was ambulating and leaving her bathroom and she tripped on the edge of the rug and fell on her right side.  Further workup was done including a trauma scan and CT pelvis showed an acute comminuted and markedly displaced right intratrochanteric femur fracture.  Orthopedic surgery was consulted and her anticoagulation was held and plan is for surgical intervention with intramedullary nail of the femur. She is POD 4 and now position has been complicated by an AKI, acute blood loss anemia as well acute respiratory failure with hypoxia and acute on chronic diastolic CHF.  She has now been initiated on diuresis and IV fluid hydration has now been discontinued given that her AKI is improved.  Blood count continues to drop slowly and now she went in A-fib with RVR.  She was given IV Metoprolol  but heart rate continued to be sustained so we will transfer to the cardiac unit and placed on a amiodarone  drip and bolus for now.  Rates have improved and now will wean her back off of the amiodarone  drip and placed her on p.o. amiodarone  at a higher dose.  Hemoglobin remains relatively stable and will wean her off of supplemental oxygen via nasal cannula. She is fatigued and a little lethargic but stable and improved from the time of Admission. She is medically stable to D/C to SNF and will need to follow up with PCP, Cardiology, and Orthopedic Surgery in the outpatient setting within 1-2 weeks.   Assessment and Plan:  Mechanical fall with resultant Right Femur Fracture: Elderly patient presented from ALF after sustaining right hip  pain from a mechanical fall. Hip imaging shows closed displaced intertrochanteric fracture of the right femur. Orthopedic surgery consulted, plan for intramedullary nailing of the femur 11/07/23 at 1530. C/w Multimodal pain control with scheduled Acetaminophen  1000 mg po q8h, Lidocaine  Patch 1 Patch TD q24h as well as Hydrocodone -Acetaminophen  5-325 1 tab po q6hprn Moderate Pain and given IV Fentanyl  50 mcg q30m minprn Severe Pain x3 doses; Further Pain control per Ortho -Follow-up vitamin D , 25-Hydroxy was 54.75 -PT/OT recommending SNF -Fall precautions -Bowel Regimen w/ Senna-Docusate 1 tab po qHSprn Mild Consitpation -WBAT per Orthopedic Surgery as well as Resumption of Apixaban ; D/w Dr. Hermina Loosen and ok to resume Apixaban  5 mg po BID  Acute Respiratory Failure w/ Hypoxia in the setting of Acute on Chronic Diastolic CHF: Had low saturations this AM. Will start Flutter Valve and Incentive. CXR done yesterday and showed Enlarged cardiopericardial silhouette with vascular congestion and pacemaker. Also there was Subtle opacity at the bases favoring atelectasis. She has been on and off oxygen with saturations ranging from 87 to low 90s.  Encourage incentive spirometry and flutter valve and then reassess. Start Diuresis as below. SpO2: 92 % O2 Flow Rate (L/min): 3 L/min; Repeat CXR yesterday AM showed "Opacity within the periphery of the left base compatible with atelectasis versus airspace disease." Unlikely PNA given that she is afebrile and has no Leukocytosis. Weaned off of Supplemental O2 and will repeat CXR in the outpatient setting    Paroxysmal A-fib with RVR and now improved Sinus node dysfunction s/p PPM: HR was stable in the 60s but she went into A-fib  with RVR 6/1/2.  Given a dose of IV metoprolol  without much effect. Discontinued her home Amiodarone  200 mg po at bedtime and place her on a Amio drip and bolus for now and transferred to the progressive care unit for further evaluation and  monitoring; Apixaban  resumed. HR is improved and will wean off of Amiodarone  gtt and discussed with Cardiology Dr. Maximo Spar who recommended transitioning to Amiodarone  200 mg po BID x7 days and then back to 200 mg po Daily    Acute on Chronic Diastolic Heart Failure, improving  Aortic Stenosis -Recent TTE from 09/14/2023 showed hyperdynamic EF >75%, severe LVH, G2DD, moderately dilated LA and moderate aortic stenosis. -Repeat CXR shows findings suggestive of CHF with prominent cardiac silhouette and pulmonary interstitial prominence with vascular congestion -Initially held diuretics on Admission and held again due to AKI and given IV Lasix  x2 and resume po Furosemide  20 mg po Daily -Strict I's and O's and Daily Weights; Patient is +1,810.8 -Continue potassium supplementation w/ po KCL 10 mEQ Daily  -Repeat CXR yesterday AM showed "Opacity within the periphery of the left base compatible with atelectasis versus airspace disease. Aortic Atherosclerosis"  Leukocytosis: Likely reactive in the setting of Surgery. WBC trending up and went from 8.2 -> 11.3 -> 13.3 -> 7.8 -> 6.9 -> 6.8. CTM for S/Sx of Infection. Repeat CBC in the AM   Hypokalemia: K+ is now 4.9. CTM and Replete as Necessary. Repeat CMP w/in 1 week  Hypophosphatemia: Phos Level is now 2.4. CTM and Replete as Necessary. Repeat Phos Level w/in 1 week  Glaucoma: C/w Brimonidine  0.2% Opthalmic Solution in Right Eye q12h, Timolol  0.5% Ophthalmic solution 1 drop Right Eye q12h, and Polyvinal Alcohol  1.4% Ophthalmic 1 Drop in R Eye TID   Hypothyroidism: TSH was 4.206. C/w Levothyroxine  100 mcg po Daily    Dementia: Has poor memory and was a little lethargic and fatigued today. C/w Memantine  10 mg po BID. Checked Vitamin B12 and was 312. Waxes and wanes  Constipation: Change PRN Senna-Docusate to scheduled 1 tab po BID; Also add Miralax  17 gram BID and Bisacodyl 10 mg RC PRN; will give her a one-time dose of bisacodyl 10 mg suppository  AKI:  BUN/Cr Trend: improved  Recent Labs  Lab 11/06/23 1652 11/07/23 0645 11/08/23 0656 11/09/23 0617 11/10/23 0605 11/10/23 1820 11/11/23 0505  BUN 18 19 26* 31* 29* 26* 27*  CREATININE 0.93 0.77 1.16* 0.84 0.71 0.84 0.68  -Check U/A, Urine Osm, Urine Cr, Urine Na+; IVF now stopped and will reinitiate IV Lasix  for short-term and then changed back to her oral Lasix  dosing -Avoid Nephrotoxic Medications, Contrast Dyes, Hypotension and Dehydration to Ensure Adequate Renal Perfusion and will need to Renally Adjust Meds. CTM and Trend Renal Function carefully and repeat CMP w/in 1 week  Macrocytic Anemia/ Acute Blood Loss Anemia in the setting of Post-Operative Drop: Hgb/Hct Trend went from 14.7/44.1 -> 13.1/39.6 -> 11.2/34.6 -> 8.6/26.8 -> 7.9/24.2 -> 8.2/25.1 -> 7.9/24.7 -> 7.6/23.8 (MCV of 105.3). Checked Anemia Panel and showed an iron level of 28, UIBC 302, TIBC 330, saturation ration of 9%, ferritin of 68, folate of 24.2 and vitamin B12 340. CTM for S/Sx of bleeding; No overt bleeding noted now but had some bleeding yesterday after surgery.  Repeat CBC w/in 1 week  Thrombocytopenia: Plt Count went from 127 -> 116 -> 138 -> 130 -> 126 -> 142. CTM for S/Sx of Bleeding; No overt bleeding noted.   Hyperbilirubinemia: Mild and Likely reactive. Trending up slightly.  T Bili went from 0.8 -> 1.3 -> 1.7. CTM and Trend and repeat CMP w/in 1 week  Hypoalbuminemia: Patient's Albumin Level went from 3.1 -> 2.8 -> 2.7 -> 2.6 x2. CTM and Trend and repeat CMP w/in 1 week  Class I Obesity: Complicates overall prognosis and care. Estimated body mass index is 37.7 kg/m as calculated from the following:   Height as of this encounter: 5\' 3"  (1.6 m).   Weight as of this encounter: 96.5 kg. Weight Loss and Dietary Counseling given

## 2023-11-07 NOTE — H&P (Signed)
 ORTHOPAEDIC CONSULTATION  REQUESTING PHYSICIAN: Eveline Hipps, DO  Chief Complaint: Right hip pain  HPI: [name] is a 89 y.o. female who presents with right hip pain after a fall from standing at her assisted living facility.  X-rays were obtained and showed a subtrochanteric displaced fracture.  She has not not been able to ambulate.  She is here today with her daughter in the hospital.  She does take Eliquis  with a history of A-fib.  She is otherwise ambulatory without assistive devices  Past Medical History:  Diagnosis Date   Aortic stenosis 03/08/2016   Brady-tachy syndrome (HCC)    a. Biotronik dual chamber PPM implanted 2009 b. gen change to STJ dual chamber PPM 2017   Ejection fraction    EF 70%, echo, October, 2009  //   EF 55-60%, septal dyssynergy consistent with a paced rhythm, mild to moderate mitral regurgitation, echo, November, 2015    GERD (gastroesophageal reflux disease) 04/28/2014   Episodes November, 2015 with fluid refluxing from her esophagus.    Hair loss    Patient questioned Coumadin, changed toPradaxa   Hypertension    Hypothyroidism    Lower extremity edema 01/22/2018   Mitral regurgitation 05/03/2014   Mild-to-moderate mitral regurgitation, echo, November, 2015    Osteoarthritis of left knee 10/25/2017   Paroxysmal atrial fibrillation (HCC)    Episodes rapid atrial fib noted by pacemaker interrogation, August, 2011, diltiazem  added, patient improved. Patient continues on Rythmol   //   Changed to flecainide  2013  //   flecainide  level checked in 2013, good level    Persistent atrial fibrillation (HCC)    Past Surgical History:  Procedure Laterality Date   ABDOMINAL HYSTERECTOMY     EP IMPLANTABLE DEVICE N/A 08/08/2015   Generator change with SJM Assurity DR PPM by Dr Nunzio Belch   JOINT REPLACEMENT     PACEMAKER PLACEMENT  2009       Social History   Socioeconomic History   Marital status: Widowed    Spouse name: Not on file   Number of  children: Not on file   Years of education: Not on file   Highest education level: Not on file  Occupational History   Not on file  Tobacco Use   Smoking status: Former    Current packs/day: 0.00    Types: Cigarettes    Quit date: 09/06/1990    Years since quitting: 33.1   Smokeless tobacco: Never  Vaping Use   Vaping status: Never Used  Substance and Sexual Activity   Alcohol use: No   Drug use: No   Sexual activity: Not on file  Other Topics Concern   Not on file  Social History Narrative   Not on file   Social Drivers of Health   Financial Resource Strain: Not on file  Food Insecurity: No Food Insecurity (11/06/2023)   Hunger Vital Sign    Worried About Running Out of Food in the Last Year: Never true    Ran Out of Food in the Last Year: Never true  Transportation Needs: No Transportation Needs (11/06/2023)   PRAPARE - Administrator, Civil Service (Medical): No    Lack of Transportation (Non-Medical): No  Physical Activity: Not on file  Stress: Not on file  Social Connections: Moderately Integrated (11/06/2023)   Social Connection and Isolation Panel [NHANES]    Frequency of Communication with Friends and Family: Twice a week    Frequency of Social Gatherings with Friends and  Family: Three times a week    Attends Religious Services: 1 to 4 times per year    Active Member of Clubs or Organizations: No    Attends Engineer, structural: More than 4 times per year    Marital Status: Widowed   Family History  Problem Relation Age of Onset   Heart disease Father    Coronary artery disease Other    Heart disease Son    - negative except otherwise stated in the family history section  Prior to Admission medications   Medication Sig Start Date End Date Taking? Authorizing Provider  acetaminophen  (TYLENOL ) 500 MG tablet Take 1,000 mg by mouth in the morning, at noon, and at bedtime.   Yes [provider]  amiodarone  (PACERONE ) 200 MG tablet  Take 1 tablet (200 mg total) by mouth daily. Patient taking differently: Take 200 mg by mouth at bedtime. 10/02/23  Yes Medina-Vargas, Monina C, NP  apixaban  (ELIQUIS ) 5 MG TABS tablet Take 1 tablet (5 mg total) by mouth 2 (two) times daily. 10/02/23  Yes Medina-Vargas, Monina C, NP  Apoaequorin (PREVAGEN) 10 MG CAPS Take 10 mg by mouth daily. Patient taking differently: Take 10 mg by mouth in the morning. 11/13/22  Yes Doreen Gamma, MD  betamethasone , augmented, (DIPROLENE ) 0.05 % gel Apply topically daily.  Apply to scalp topically every 12 hours as needed for psoriasis related to PSORIASIS, UNSPECIFIED (L40.9) apply to affected area of scalp 11/13/22  Yes Doreen Gamma, MD  brimonidine -timolol  (COMBIGAN ) 0.2-0.5 % ophthalmic solution Place 1 drop into the right eye every 12 (twelve) hours.   Yes [provider]  Calcium  Carbonate-Vit D-Min (CALCIUM  600+D PLUS MINERALS) 600-400 MG-UNIT TABS Take 1 tablet by mouth daily. Patient taking differently: Take 1 tablet by mouth in the morning. 11/13/22  Yes Doreen Gamma, MD  diclofenac  Sodium (VOLTAREN ) 1 % GEL Apply 1 g topically 2 (two) times daily as needed (to L foot bunion deformity for pain).   Yes [provider]  furosemide  (LASIX ) 20 MG tablet TAKE ONE TABLET BY MOUTH EVERY DAY Patient taking differently: Take 20 mg by mouth in the morning. 03/19/23  Yes Veludandi, Prashanthi, MD  Ipratropium-Albuterol (COMBIVENT) 20-100 MCG/ACT AERS respimat Inhale 2 puffs into the lungs every 6 (six) hours as needed for shortness of breath.   Yes [provider]  levothyroxine  (SYNTHROID ) 100 MCG tablet TAKE ONE TABLET BY MOUTH EVERY DAY IN THE MORNING 09/18/23  Yes Tye Gall, MD  lidocaine  (HM LIDOCAINE  PATCH) 4 % Place 1 patch onto the skin in the morning. Apply topically to L shoulder in the morning. Apply for 12 hours (0900) then removed for 12 hours (2100).   Yes [provider]  Multiple Vitamin  (MULTIVITAMIN ADULT) TABS Take 1 tablet by mouth in the morning.   Yes [provider]  Multiple Vitamins-Minerals (OCUVITE PO) Take 1 capsule by mouth in the morning.   Yes [provider]  NAMENDA  10 MG tablet TAKE ONE TABLET BY MOUTH TWICE A DAY FOR COGNITIVE DECLINE Patient taking differently: Take 10 mg by mouth 2 (two) times daily. 04/23/23  Yes Veludandi, Prashanthi, MD  nystatin cream (MYCOSTATIN) Apply 1 Application topically every 12 (twelve) hours as needed (for rash).   Yes [provider]  pantoprazole (PROTONIX) 40 MG tablet Take 40 mg by mouth daily as needed (for indigestion).   Yes [provider]  Polyethyl Glyc-Propyl Glyc PF (SYSTANE HYDRATION PF) 0.4-0.3 % SOLN Place 1  drop into the right eye in the morning, at noon, in the evening, and at bedtime.   Yes [provider]  potassium chloride  (KLOR-CON  M) 10 MEQ tablet Take 1 tablet (10 mEq total) by mouth daily. Patient taking differently: Take 10 mEq by mouth in the morning. 06/28/23  Yes Tye Gall, MD   CT HEAD WO CONTRAST Result Date: 11/06/2023 CLINICAL DATA:  Head trauma, mechanical fall. EXAM: CT HEAD WITHOUT CONTRAST CT CERVICAL SPINE WITHOUT CONTRAST TECHNIQUE: Multidetector CT imaging of the head and cervical spine was performed following the standard protocol without intravenous contrast. Multiplanar CT image reconstructions of the cervical spine were also generated. RADIATION DOSE REDUCTION: This exam was performed according to the departmental dose-optimization program which includes automated exposure control, adjustment of the mA and/or kV according to patient size and/or use of iterative reconstruction technique. COMPARISON:  CT head 09/13/2023, CT cervical spine 09/25/2020. FINDINGS: CT HEAD FINDINGS Brain: No acute intracranial hemorrhage. No CT evidence of acute infarct. Nonspecific hypoattenuation in the periventricular and subcortical white matter favored to  reflect chronic microvascular ischemic changes. Generalized parenchymal volume loss. No edema, mass effect, or midline shift. The basilar cisterns are patent. Ventricles: The ventricles are normal. Vascular: Atherosclerotic calcifications of the carotid siphons. No hyperdense vessel. Skull: No acute or aggressive finding. Orbits: Orbits are symmetric. Sinuses: Complete opacification of the left maxillary sinus. Mild mucosal thickening in the ethmoid sinuses and right sphenoid sinus. Other: Mastoid air cells are clear. CT CERVICAL SPINE FINDINGS Alignment: Straightening of the normal cervical lordosis. No significant listhesis. No facet subluxation or dislocation. Skull base and vertebrae: No acute fracture. No primary bone lesion or focal pathologic process. Soft tissues and spinal canal: No prevertebral fluid or swelling. No visible canal hematoma. Disc levels: Intervertebral disc space narrowing at multiple levels most pronounced at C4-5. Disc osteophyte complexes at multiple levels. Disc bulge and thickening of the ligamentum flavum at C3-4 resulting in moderate spinal canal stenosis. Additional disc osteophyte complexes at C4-5 and C5-6 contributing to moderate spinal canal stenosis. Facet arthrosis and uncovertebral hypertrophy at multiple levels. Foraminal narrowing most pronounced on the left at C6-7. Upper chest: Negative. Other: None. IMPRESSION: No CT evidence of acute intracranial abnormality. No acute fracture or traumatic malalignment of the cervical spine. Degenerative changes as above. Moderate chronic microvascular ischemic changes and generalized parenchymal volume loss. Electronically Signed   By: Denny Flack M.D.   On: 11/06/2023 18:25   CT CERVICAL SPINE WO CONTRAST Result Date: 11/06/2023 CLINICAL DATA:  Head trauma, mechanical fall. EXAM: CT HEAD WITHOUT CONTRAST CT CERVICAL SPINE WITHOUT CONTRAST TECHNIQUE: Multidetector CT imaging of the head and cervical spine was performed following  the standard protocol without intravenous contrast. Multiplanar CT image reconstructions of the cervical spine were also generated. RADIATION DOSE REDUCTION: This exam was performed according to the departmental dose-optimization program which includes automated exposure control, adjustment of the mA and/or kV according to patient size and/or use of iterative reconstruction technique. COMPARISON:  CT head 09/13/2023, CT cervical spine 09/25/2020. FINDINGS: CT HEAD FINDINGS Brain: No acute intracranial hemorrhage. No CT evidence of acute infarct. Nonspecific hypoattenuation in the periventricular and subcortical white matter favored to reflect chronic microvascular ischemic changes. Generalized parenchymal volume loss. No edema, mass effect, or midline shift. The basilar cisterns are patent. Ventricles: The ventricles are normal. Vascular: Atherosclerotic calcifications of the carotid siphons. No hyperdense vessel. Skull: No acute or aggressive finding. Orbits: Orbits are symmetric. Sinuses: Complete opacification of the left maxillary sinus. Mild  mucosal thickening in the ethmoid sinuses and right sphenoid sinus. Other: Mastoid air cells are clear. CT CERVICAL SPINE FINDINGS Alignment: Straightening of the normal cervical lordosis. No significant listhesis. No facet subluxation or dislocation. Skull base and vertebrae: No acute fracture. No primary bone lesion or focal pathologic process. Soft tissues and spinal canal: No prevertebral fluid or swelling. No visible canal hematoma. Disc levels: Intervertebral disc space narrowing at multiple levels most pronounced at C4-5. Disc osteophyte complexes at multiple levels. Disc bulge and thickening of the ligamentum flavum at C3-4 resulting in moderate spinal canal stenosis. Additional disc osteophyte complexes at C4-5 and C5-6 contributing to moderate spinal canal stenosis. Facet arthrosis and uncovertebral hypertrophy at multiple levels. Foraminal narrowing most  pronounced on the left at C6-7. Upper chest: Negative. Other: None. IMPRESSION: No CT evidence of acute intracranial abnormality. No acute fracture or traumatic malalignment of the cervical spine. Degenerative changes as above. Moderate chronic microvascular ischemic changes and generalized parenchymal volume loss. Electronically Signed   By: Denny Flack M.D.   On: 11/06/2023 18:25   CT PELVIS WO CONTRAST Result Date: 11/06/2023 CLINICAL DATA:  Hip trauma, fracture suspected, xray done Right hip pain. EXAM: CT OF THE PELVIS WITHOUT CONTRAST TECHNIQUE: Multidetector CT imaging of the pelvis was performed according to the standard protocol. Multiplanar CT image reconstructions were also generated. RADIATION DOSE REDUCTION: This exam was performed according to the departmental dose-optimization program which includes automated exposure control, adjustment of the mA and/or kV according to patient size and/or use of iterative reconstruction technique. COMPARISON:  Radiograph earlier today FINDINGS: Bones/Joint/Cartilage Comminuted and displaced proximal femur fracture. There is a displaced component involving the lesser trochanter. There is a transverse subtrochanteric component. Fracture extends to involve the base of the femoral neck. No hip dislocation, the femoral head remains seated. Intact pubic rami. No sacral fracture. Minimal right hip joint effusion. Ligaments Suboptimally assessed by CT. Muscles and Tendons Mild edema in the skill a chair today shin to the fracture. No muscle atrophy. Soft tissues Colonic diverticulosis without diverticulitis. Stable 3.7 cm cystic structure in the left adnexa adjacent to the left aspect of the vaginal cuff from 2023 hip CT, likely benign given stability. There is a fat containing umbilical hernia. Aortic atherosclerosis. IMPRESSION: Comminuted and displaced proximal right femur fracture with a displaced component involving the lesser trochanter. There is a transverse  subtrochanteric component. Fracture extends to involve the base of the femoral neck. Aortic Atherosclerosis (ICD10-I70.0). Electronically Signed   By: Chadwick Colonel M.D.   On: 11/06/2023 18:19   DG Hip Unilat With Pelvis 2-3 Views Right Result Date: 11/06/2023 CLINICAL DATA:  fall EXAM: DG HIP (WITH OR WITHOUT PELVIS) 2-3V RIGHT COMPARISON:  None Available. FINDINGS: Acute comminuted and markedly displaced right intertrochanteric femoral fracture. No right hip dislocation. Query slight irregularity of the left femoral neck. Poorly visualized left hip- limited evaluation due to overlapping osseous structures and overlying soft tissues. view. No acute displaced fracture or diastasis of the bones of the pelvis. No acute displaced fractured diastasis of the bones of the pelvis. There is no evidence of severe arthropathy or other focal bone abnormality. IMPRESSION: Acute comminuted and markedly displaced right intertrochanteric femoral fracture. Poorly visualized left hip- limited evaluation due to overlapping osseous structures and overlying soft tissues. Query slight irregularity of the left femoral neck. Recommend dedicated three view radiograph of the left hip for further evaluation. Electronically Signed   By: Morgane  Naveau M.D.   On: 11/06/2023 17:48  DG Chest Port 1 View Result Date: 11/06/2023 CLINICAL DATA:  fall EXAM: PORTABLE CHEST 1 VIEW COMPARISON:  Chest x-Meek 09/13/2023 FINDINGS: Left chest wall dual lead pacemaker. The heart and mediastinal contours are unchanged. Atherosclerotic plaque. Left base atelectasis. No focal consolidation. Chronic coarsened interstitial markings with no overt pulmonary edema. No pleural effusion. No pneumothorax. No acute osseous abnormality. IMPRESSION: 1. No active disease. 2.  Aortic Atherosclerosis (ICD10-I70.0). Electronically Signed   By: Morgane  Naveau M.D.   On: 11/06/2023 17:43     Positive ROS: All other systems have been reviewed and were otherwise  negative with the exception of those mentioned in the HPI and as above.  Physical Exam: General: No acute distress Cardiovascular: No pedal edema Respiratory: No cyanosis, no use of accessory musculature GI: No organomegaly, abdomen is soft and non-tender Skin: No lesions in the area of chief complaint Neurologic: Sensation intact distally Psychiatric: Patient is at baseline mood and affect Lymphatic: No axillary or cervical lymphadenopathy  MUSCULOSKELETAL:  Right hip is shortened.  Distal neurosensory exam is intact.  No abrasions about the skin.  2+ dorsalis pedis pulse  Independent Imaging Review: 3 views right hip: Displaced subcontractor hip fracture  Assessment: 89 year old female with a displaced right subtrochanteric hip fracture after fall from standing.  At this time I did discuss that I would recommend cephalomedullary nail fixation in order to optimize her mobility and weightbearing.  I did discuss risks and limitations as well as alternatives to this procedure.  After discussion of all the alternatives both her and her daughter have elected to proceed with right hip cephalomedullary nailing  Plan: Plan for right hip cephalomedullary nailing   After a lengthy discussion of treatment options, including risks, benefits, alternatives, complications of surgical and nonsurgical conservative options, the patient elected surgical repair.   The patient  is aware of the material risks  and complications including, but not limited to injury to adjac3ent structures, neurovascular injury, infection, numbness, bleeding, implant failure, thermal burns, stiffness, persistent pain, failure to heal, disease transmission from allograft, need for further surgery, dislocation, anesthetic risks, blood clots, risks of death,and others. The probabilities of surgical success and failure discussed with patient given their particular co-morbidities.The time and nature of expected rehabilitation and  recovery was discussed.The patient's questions were all answered preoperatively.  No barriers to understanding were noted. I explained the natural history of the disease process and Rx rationale.  I explained to the patient what I considered to be reasonable expectations given their personal situation.  The final treatment plan was arrived at through a shared patient decision making process model.   Thank you for the consult and the opportunity to see Ms. Poma  [name] Harada, MD [facility] 7:06 AM

## 2023-11-08 ENCOUNTER — Inpatient Hospital Stay (HOSPITAL_COMMUNITY)

## 2023-11-08 ENCOUNTER — Other Ambulatory Visit: Payer: Self-pay

## 2023-11-08 ENCOUNTER — Encounter (HOSPITAL_COMMUNITY): Payer: Self-pay | Admitting: Orthopaedic Surgery

## 2023-11-08 DIAGNOSIS — S72141A Displaced intertrochanteric fracture of right femur, initial encounter for closed fracture: Secondary | ICD-10-CM | POA: Diagnosis not present

## 2023-11-08 DIAGNOSIS — D62 Acute posthemorrhagic anemia: Secondary | ICD-10-CM

## 2023-11-08 DIAGNOSIS — N179 Acute kidney failure, unspecified: Secondary | ICD-10-CM | POA: Diagnosis not present

## 2023-11-08 DIAGNOSIS — R0902 Hypoxemia: Secondary | ICD-10-CM

## 2023-11-08 DIAGNOSIS — D696 Thrombocytopenia, unspecified: Secondary | ICD-10-CM | POA: Diagnosis not present

## 2023-11-08 DIAGNOSIS — I480 Paroxysmal atrial fibrillation: Secondary | ICD-10-CM | POA: Diagnosis not present

## 2023-11-08 LAB — COMPREHENSIVE METABOLIC PANEL WITH GFR
ALT: 18 U/L (ref 0–44)
AST: 27 U/L (ref 15–41)
Albumin: 3.1 g/dL — ABNORMAL LOW (ref 3.5–5.0)
Alkaline Phosphatase: 31 U/L — ABNORMAL LOW (ref 38–126)
Anion gap: 9 (ref 5–15)
BUN: 26 mg/dL — ABNORMAL HIGH (ref 8–23)
CO2: 23 mmol/L (ref 22–32)
Calcium: 8.6 mg/dL — ABNORMAL LOW (ref 8.9–10.3)
Chloride: 106 mmol/L (ref 98–111)
Creatinine, Ser: 1.16 mg/dL — ABNORMAL HIGH (ref 0.44–1.00)
GFR, Estimated: 44 mL/min — ABNORMAL LOW (ref >60)
Glucose, Bld: 148 mg/dL — ABNORMAL HIGH (ref 70–99)
Potassium: 4.5 mmol/L (ref 3.5–5.1)
Sodium: 138 mmol/L (ref 135–145)
Total Bilirubin: 0.8 mg/dL (ref 0.0–1.2)
Total Protein: 6.1 g/dL — ABNORMAL LOW (ref 6.5–8.1)

## 2023-11-08 LAB — URINALYSIS, COMPLETE (UACMP) WITH MICROSCOPIC
Bacteria, UA: NONE SEEN
Bilirubin Urine: NEGATIVE
Glucose, UA: NEGATIVE mg/dL
Hgb urine dipstick: NEGATIVE
Ketones, ur: NEGATIVE mg/dL
Leukocytes,Ua: NEGATIVE
Nitrite: NEGATIVE
Protein, ur: NEGATIVE mg/dL
Specific Gravity, Urine: 1.025 (ref 1.005–1.030)
pH: 5 (ref 5.0–8.0)

## 2023-11-08 LAB — CBC WITH DIFFERENTIAL/PLATELET
Abs Immature Granulocytes: 0.08 10*3/uL — ABNORMAL HIGH (ref 0.00–0.07)
Basophils Absolute: 0 10*3/uL (ref 0.0–0.1)
Basophils Relative: 0 %
Eosinophils Absolute: 0 10*3/uL (ref 0.0–0.5)
Eosinophils Relative: 0 %
HCT: 34.6 % — ABNORMAL LOW (ref 36.0–46.0)
Hemoglobin: 11.2 g/dL — ABNORMAL LOW (ref 12.0–15.0)
Immature Granulocytes: 1 %
Lymphocytes Relative: 13 %
Lymphs Abs: 1.5 10*3/uL (ref 0.7–4.0)
MCH: 33.2 pg (ref 26.0–34.0)
MCHC: 32.4 g/dL (ref 30.0–36.0)
MCV: 102.7 fL — ABNORMAL HIGH (ref 80.0–100.0)
Monocytes Absolute: 0.8 10*3/uL (ref 0.1–1.0)
Monocytes Relative: 7 %
Neutro Abs: 8.9 10*3/uL — ABNORMAL HIGH (ref 1.7–7.7)
Neutrophils Relative %: 79 %
Platelets: 138 10*3/uL — ABNORMAL LOW (ref 150–400)
RBC: 3.37 MIL/uL — ABNORMAL LOW (ref 3.87–5.11)
RDW: 13.2 % (ref 11.5–15.5)
WBC: 11.3 10*3/uL — ABNORMAL HIGH (ref 4.0–10.5)
nRBC: 0 % (ref 0.0–0.2)

## 2023-11-08 LAB — OSMOLALITY, URINE: Osmolality, Ur: 521 mosm/kg (ref 300–900)

## 2023-11-08 LAB — MAGNESIUM: Magnesium: 2.1 mg/dL (ref 1.7–2.4)

## 2023-11-08 LAB — PHOSPHORUS: Phosphorus: 4.2 mg/dL (ref 2.5–4.6)

## 2023-11-08 LAB — HEMOGLOBIN A1C
Hgb A1c MFr Bld: 4.8 % (ref 4.8–5.6)
Mean Plasma Glucose: 91.06 mg/dL

## 2023-11-08 LAB — SODIUM, URINE, RANDOM: Sodium, Ur: 14 mmol/L

## 2023-11-08 LAB — CREATININE, URINE, RANDOM: Creatinine, Urine: 116 mg/dL

## 2023-11-08 MED ORDER — DM-GUAIFENESIN ER 30-600 MG PO TB12
1.0000 | ORAL_TABLET | Freq: Two times a day (BID) | ORAL | Status: DC
  Administered 2023-11-08 – 2023-11-12 (×9): 1 via ORAL
  Filled 2023-11-08 (×10): qty 1

## 2023-11-08 MED ORDER — ALBUTEROL SULFATE (2.5 MG/3ML) 0.083% IN NEBU: 2.5000 mg | INHALATION_SOLUTION | Freq: Four times a day (QID) | RESPIRATORY_TRACT | Status: DC | PRN

## 2023-11-08 MED ORDER — SODIUM CHLORIDE 0.9 % IV SOLN: INTRAVENOUS | Status: DC

## 2023-11-08 MED ORDER — OXYCODONE HCL 5 MG PO TABS: 5.0000 mg | ORAL_TABLET | ORAL | 0 refills | Status: DC | PRN

## 2023-11-08 MED ORDER — APIXABAN 5 MG PO TABS
5.0000 mg | ORAL_TABLET | Freq: Two times a day (BID) | ORAL | Status: DC
  Administered 2023-11-08 – 2023-11-12 (×9): 5 mg via ORAL
  Filled 2023-11-08 (×9): qty 1

## 2023-11-08 NOTE — Evaluation (Signed)
 Physical Therapy Evaluation Patient Details Name: [name] MRN: [id number] DOB: [birth year] Today's Date: 11/08/2023  History of Present Illness  89 y.o. female presents to [facility] ED on 11/06/23 from ALF after a fall from attempting to stand. Pt sustained displaced intertrochanteric fx of R hip. Pt is s/p R hip cephulomedullary nail on 11/07/23, WBAT. PMHx: moderate to severe aortic stenosis, sick sinus syndrome status post pacemaker placement, paroxysmal A-fib on Eliquis , hypertension, hypothyroidism and senile dementia.   Clinical Impression  Prior to admission, pt lives alone at an ALF, using an upright RW for mobilization, and occasionally a rollator for longer distances. Pt presents to evaluation with deficits in strength, range of motion, mobility, balance, activity tolerance and is limited by cognition, pain, and lethargy this session. Pt was able to sit edge of bed w/ heavy physical assistance for obtaining position. Pt attempted to perform sit to stand but required complete physical assistance.  Pt would benefit from further bed mobility, transfer, and gait training. PT will continue to treat patient while she is admitted. Patient will benefit from continued inpatient follow up therapy, <3 hours/day.   Discussed discharge recommendation of short term rehab with pt and pt's daughter. Pt's daughter reports pt's ALF has skilled nursing rehab as a part of their facility.         If plan is discharge home, recommend the following: A lot of help with walking and/or transfers;A lot of help with bathing/dressing/bathroom;Assist for transportation;Supervision due to cognitive status   Can travel by private vehicle        Equipment Recommendations None recommended by PT  Recommendations for Other Services       Functional Status Assessment Patient has had a recent decline in their functional status and demonstrates the ability to make significant improvements in function in a reasonable and  predictable amount of time.     Precautions / Restrictions Precautions Precautions: Fall Recall of Precautions/Restrictions: Impaired Restrictions Weight Bearing Restrictions Per Provider Order: Yes RLE Weight Bearing Per Provider Order: Weight bearing as tolerated      Mobility  Bed Mobility Overal bed mobility: Needs Assistance Bed Mobility: Supine to Sit, Sit to Supine     Supine to sit: Max assist, +2 for physical assistance, HOB elevated, Used rails Sit to supine: Max assist, +2 for physical assistance, HOB elevated, Used rails   General bed mobility comments: Pt completed sup <> sit on EOB w/ max A +2 for trunk and LE management. Pt demonstrated significant guarding of RLE, not allowing therapist to advance without frequent reminders of the importance of mobilization following surgery.    Transfers Overall transfer level: Needs assistance Equipment used: None Transfers: Sit to/from Stand Sit to Stand: Total assist (HHA given anterior to patient)           General transfer comment: Pt attempted sit <> stand from EOB w/ hands on physical assistance, anterior to patient. Patient was total A, demonstrating decreased knee, hip and trunk extension; L knee block given. For second trial, patient demonstrated slightly more hip and trunk extension, but lacked significant amount to obtain upright position and complete stand pivot transfer safely.    Ambulation/Gait                  Stairs            Wheelchair Mobility     Tilt Bed    Modified Rankin (Stroke Patients Only)       Balance Overall balance assessment: Needs  assistance Sitting-balance support: Bilateral upper extremity supported, Feet supported Sitting balance-Leahy Scale: Poor Sitting balance - Comments: pt is able to sit EOB w/ bilateral UE support for ~30 seconds before demonstrating posterior or right lateral trunk lean, requiring physical assistance for obtaining upright. Postural  control: Posterior lean, Right lateral lean                                   Pertinent Vitals/Pain Pain Assessment Pain Assessment: Faces Faces Pain Scale: Hurts even more Pain Descriptors / Indicators: Grimacing, Guarding Pain Intervention(s): Limited activity within patient's tolerance, Monitored during session    Home Living Family/patient expects to be discharged to:: Assisted living                 Home Equipment: Rollator (4 wheels);Other (comment);Toilet riser (upright RW) Additional Comments[facility]    Prior Function Prior Level of Function : Needs assist       Physical Assist : ADLs (physical)   ADLs (physical): Bathing Mobility Comments: As per pt's daughter, pt mobilizies mod I with upright rolling walker to dining hall. For longer community level distances, pt uses rollator. ADLs Comments: (P) assist with bathing, toileting on her own. walks to dining hall to get meals 3x/day.     Extremity/Trunk Assessment   Upper Extremity Assessment Upper Extremity Assessment: Defer to OT evaluation    Lower Extremity Assessment Lower Extremity Assessment: Generalized weakness;RLE deficits/detail RLE Deficits / Details: decreased strength, formal range of motoin assessmet deferred due to pain RLE: Unable to fully assess due to pain    Cervical / Trunk Assessment Cervical / Trunk Assessment: Kyphotic  Communication   Communication Communication: No apparent difficulties    Cognition Arousal: Lethargic Behavior During Therapy: Flat affect   PT - Cognitive impairments: History of cognitive impairments, Orientation, Awareness, Memory, Attention, Initiation, Sequencing, Problem solving, Safety/Judgement   Orientation impairments: Place, Time, Situation                     Following commands: Impaired Following commands impaired: Follows one step commands inconsistently     Cueing Cueing Techniques: Verbal cues, Tactile  cues, Visual cues     General Comments General comments (skin integrity, edema, etc.): vitals remained stable throughout session    Exercises Other Exercises Other Exercises: ankle pumps (5) (pt lacks significant amount of PF and DF) Other Exercises: LAQ (pt trialed but unable to successfully complete)   Assessment/Plan    PT Assessment Patient needs continued PT services  PT Problem List Decreased strength;Decreased range of motion;Decreased activity tolerance;Decreased balance;Decreased mobility;Decreased coordination;Decreased cognition;Decreased knowledge of use of DME;Decreased safety awareness;Decreased knowledge of precautions;Pain       PT Treatment Interventions DME instruction;Gait training;Functional mobility training;Therapeutic activities;Therapeutic exercise;Balance training;Neuromuscular re-education;Cognitive remediation;Patient/family education;Wheelchair mobility training    PT Goals (Current goals can be found in the Care Plan section)  Acute Rehab PT Goals Patient Stated Goal: pt unable to state goal at this time PT Goal Formulation: With patient/family Time For Goal Achievement: 11/22/23 Potential to Achieve Goals: Fair    Frequency Min 3X/week     Co-evaluation               AM-PAC PT "6 Clicks" Mobility  Outcome Measure Help needed turning from your back to your side while in a flat bed without using bedrails?: Total Help needed moving from lying on your back to sitting on  the side of a flat bed without using bedrails?: Total Help needed moving to and from a bed to a chair (including a wheelchair)?: Total Help needed standing up from a chair using your arms (e.g., wheelchair or bedside chair)?: Total Help needed to walk in hospital room?: Total Help needed climbing 3-5 steps with a railing? : Total 6 Click Score: 6    End of Session Equipment Utilized During Treatment: Gait belt;Oxygen (2L via [place]) Activity Tolerance: Patient limited by  lethargy;Patient limited by pain;Other (comment) (cognition) Patient left: in bed;with call bell/phone within reach;with bed alarm set;with family/visitor present;with nursing/sitter in room Nurse Communication: Mobility status PT Visit Diagnosis: Unsteadiness on feet (R26.81);Muscle weakness (generalized) (M62.81);History of falling (Z91.81);Pain Pain - Right/Left: Right Pain - part of body: Hip    Time: 0826-0909 PT Time Calculation (min) (ACUTE ONLY): 43 min   Charges:   PT Evaluation $PT Eval Low Complexity: 1 Low PT Treatments $Therapeutic Activity: 8-22 mins PT General Charges $$ ACUTE PT VISIT: 1 Visit         Lonell Rives, SPT Acute Rehab [phone]   Lonell Rives 11/08/2023, 1:34 PM

## 2023-11-08 NOTE — Evaluation (Signed)
 Occupational Therapy Evaluation Patient Details Name: [name] MRN: [id number] DOB: 1927-11-12 Today's Date: 11/08/2023   History of Present Illness   89 y.o. female presents to [facility] ED on 11/06/23 from ALF after a fall from attempting to stand. Pt sustained displaced intertrochanteric fx of R hip. Pt is s/p R hip cephulomedullary nail on 11/07/23, WBAT. PMHx: moderate to severe aortic stenosis, sick sinus syndrome status post pacemaker placement, paroxysmal A-fib on Eliquis , hypertension, hypothyroidism and senile dementia.     Clinical Impressions PTA patient independent with most ADLS, having support for bathing; using upright walker for mobility to/from [facility].  Admitted for above and presents with problem list below.  Pt requires max assist for bed mobility, mod to total assist for ADLs.  Oriented to self only, reoriented and unable to retain orientation information.  Pt with difficulty naming family members in room.  She does follow some simple commands with increased time.  Fatigues easily with activity.  Based on performance today, believe patient will best benefit from continued OT services acutely and after dc at inpatient setting with <3hrs/day to optimize independence, safety with ADLs and mobility.     If plan is discharge home, recommend the following:   Two people to help with walking and/or transfers;Two people to help with bathing/dressing/bathroom     Functional Status Assessment   Patient has had a recent decline in their functional status and demonstrates the ability to make significant improvements in function in a reasonable and predictable amount of time.     Equipment Recommendations   Other (comment) (defer)     Recommendations for Other Services         Precautions/Restrictions   Precautions Precautions: Fall Recall of Precautions/Restrictions: Impaired Restrictions Weight Bearing Restrictions Per Provider Order:  Yes RLE Weight Bearing Per Provider Order: Weight bearing as tolerated     Mobility Bed Mobility Overal bed mobility: Needs Assistance Bed Mobility: Supine to Sit, Sit to Supine     Supine to sit: Max assist, HOB elevated, Used rails Sit to supine: Max assist, HOB elevated, Used rails   General bed mobility comments: Pt completed sup <> sit on EOB w/ max A, daughter assisting minimally to comfort patient.  Pt able to pull up to assist with trunk and assist L LE, but needing most of the help for R LE. +2 total to reposition in bed    Transfers                   General transfer comment: deferred      Balance Overall balance assessment: Needs assistance Sitting-balance support: Feet supported, Single extremity supported Sitting balance-Leahy Scale: Poor Sitting balance - Comments: preference to UE support but contact guard at EOB with 1 UE support                                   ADL either performed or assessed with clinical judgement   ADL Overall ADL's : Needs assistance/impaired     Grooming: Moderate assistance;Bed level           Upper Body Dressing : Moderate assistance;Sitting   Lower Body Dressing: +2 for physical assistance;Total assistance;Bed level     Toilet Transfer Details (indicate cue type and reason): deferred         Functional mobility during ADLs: Maximal assistance;+2 for physical assistance  Vision   Vision Assessment?: No apparent visual deficits Additional Comments: not tested     Perception         Praxis         Pertinent Vitals/Pain Pain Assessment Pain Assessment: Faces Faces Pain Scale: Hurts even more Pain Descriptors / Indicators: Grimacing, Guarding Pain Intervention(s): Limited activity within patient's tolerance, Monitored during session, Repositioned     Extremity/Trunk Assessment Upper Extremity Assessment Upper Extremity Assessment: Overall WFL for tasks assessed   Lower  Extremity Assessment Lower Extremity Assessment: Defer to PT evaluation RLE Deficits / Details: decreased strength, formal range of motoin assessmet deferred due to pain RLE: Unable to fully assess due to pain   Cervical / Trunk Assessment Cervical / Trunk Assessment: Kyphotic   Communication Communication Communication: No apparent difficulties   Cognition Arousal: Alert Behavior During Therapy: Flat affect Cognition: History of cognitive impairments, Cognition impaired   Orientation impairments: Place, Time, Situation Awareness: Intellectual awareness impaired, Online awareness impaired Memory impairment (select all impairments): Short-term memory, Working [employer] fast streamer, Non-declarative long-term memory, Geneticist, molecular long-term memory Attention impairment (select first level of impairment): Focused attention Executive functioning impairment (select all impairments): Initiation, Organization, Sequencing, Reasoning, Problem solving OT - Cognition Comments: per chart hx of dementia, oriented to self only.  Unable to sustain orientation during session. Pt having difficulty with naming family members in room.                 Following commands: Impaired Following commands impaired: Follows one step commands inconsistently     Cueing  General Comments   Cueing Techniques: Verbal cues;Tactile cues;Visual cues  daughter, son and daughter in law present upon entry.  poor tolerance to upright but HR and SPO2 stable on 2L   Exercises     Shoulder Instructions      Home Living Family/patient expects to be discharged to:: Assisted living                             Home Equipment: Rollator (4 wheels);Other (comment);Toilet riser (upright RW)   Additional [facility]      Prior Functioning/Environment Prior Level of Function : Needs assist       Physical Assist : ADLs (physical)   ADLs (physical): Bathing;IADLs Mobility Comments: As per pt's  daughter, pt mobilizies mod I with upright rolling walker to dining hall. For longer community level distances, pt uses rollator. ADLs Comments: assist with bathing, toileting on her own. walks to dining hall to get meals 3x/day.    OT Problem List: Decreased strength;Decreased range of motion;Impaired balance (sitting and/or standing);Pain;Decreased knowledge of precautions;Decreased knowledge of use of DME or AE;Decreased cognition;Decreased coordination;Decreased activity tolerance;Obesity   OT Treatment/Interventions: Self-care/ADL training;Therapeutic exercise;DME and/or AE instruction;Therapeutic activities;Cognitive remediation/compensation;Patient/family education;Balance training      OT Goals(Current goals can be found in the care plan section)   Acute Rehab OT Goals Patient Stated Goal: none stated OT Goal Formulation: Patient unable to participate in goal setting Time For Goal Achievement: 11/22/23 Potential to Achieve Goals: Good   OT Frequency:  Min 2X/week    Co-evaluation              AM-PAC OT "6 Clicks" Daily Activity     Outcome Measure Help from another person eating meals?: A Lot Help from another person taking care of personal grooming?: A Lot Help from another person toileting, which includes using toliet, bedpan, or urinal?: Total Help from  another person bathing (including washing, rinsing, drying)?: A Lot Help from another person to put on and taking off regular upper body clothing?: A Lot Help from another person to put on and taking off regular lower body clothing?: Total 6 Click Score: 10   End of Session Equipment Utilized During Treatment: Oxygen Nurse Communication: Mobility status  Activity Tolerance: Patient tolerated treatment well Patient left: in bed;with call bell/phone within reach;with bed alarm set;with family/visitor present;with nursing/sitter in room  OT Visit Diagnosis: Other abnormalities of gait and mobility (R26.89);Muscle  weakness (generalized) (M62.81);Pain;Other symptoms and signs involving cognitive function Pain - Right/Left: Right Pain - part of body: Hip;Leg                Time: 8119-1478 OT Time Calculation (min): 23 min Charges:  OT General Charges $OT Visit: 1 Visit OT Evaluation $OT Eval Moderate Complexity: 1 Mod OT Treatments $Self Care/Home Management : 8-22 mins  Bary Boss, OT Acute Rehabilitation Services Office [phone] Secure Chat Preferred    Fredrich Jefferson 11/08/2023, 1:47 PM

## 2023-11-08 NOTE — TOC Initial Note (Addendum)
 Transition of Care [facility]) - Initial/Assessment Note    Patient Details  Name: [name] MRN: [id number] Date of Birth: [birth year]  [facility]) CM/SW Contact:    Elspeth Hals, LCSW Phone Number: 11/08/2023, 3:51 PM  Clinical Narrative:       Pt oriented x2, pleasant and able to participate in conversation.  Daughter Fredrik Jensen in room, all information from her.  Pt from ALF at [facility].  Daughter confirms plan to return to STR there.    CSW spoke with Hospital doctor at [facility] earlier in the day.  They are expecting her at [facility] but will not have bed before Monday.             Medicare payer with inpt order on 11/06/23.  Expected Discharge Plan: Skilled Nursing Facility Barriers to Discharge: Continued Medical Work up   Patient Goals and CMS Choice Patient states their goals for this hospitalization and ongoing recovery are:: do what I did before   Choice offered to / list presented to : Patient, Adult Children (daughter Fredrik Jensen indicates plan to return to [facility])      Expected Discharge Plan and Services In-house Referral: Clinical Social Work   Post Acute Care Choice: Skilled Nursing Facility Living arrangements for the past 2 months: Assisted Living Facility ([facility])                                      Prior Living Arrangements/Services Living arrangements for the past 2 months: Assisted Living Facility ([facility]) Lives with:: Facility Resident Patient language and need for interpreter reviewed:: Yes Do you feel safe going back to the place where you live?: Yes      Need for Family Participation in Patient Care: Yes (Comment) Care giver support system in place?: Yes (comment) Current home services: Other (comment) (na) Criminal Activity/Legal Involvement Pertinent to Current Situation/Hospitalization: No - Comment as needed  Activities of Daily Living      Permission Sought/Granted Permission sought  to share information with : Family Supports Permission granted to share information with : Yes, Verbal Permission Granted  Share Information with NAME: both daughters: Regan Cao           Emotional Assessment Appearance:: Appears stated age Attitude/Demeanor/Rapport: Engaged Affect (typically observed): Pleasant Orientation: : Oriented to Self, Oriented to Place      Admission diagnosis:  Fall, initial encounter [W19.XXXA] Closed fracture of right hip, initial encounter (HCC) [S72.001A] Closed displaced intertrochanteric fracture of right femur, initial encounter (HCC) [S72.141A] Patient Active Problem List   Diagnosis Date Noted   Closed displaced intertrochanteric fracture of right femur, initial encounter (HCC) 11/06/2023   NSTEMI (non-ST elevated myocardial infarction) (HCC) 09/14/2023   Chest pain 09/13/2023   Left shoulder pain 06/18/2023   Acute gastritis 05/27/2023   BRBPR (bright red blood per rectum) 05/24/2023   COVID-19 virus infection 03/11/2023   Infected wound 01/24/2023   Contusion of great toe of left foot 01/13/2023   Numbness of fingers of both hands 09/18/2022   Fall 08/24/2022   UTI (urinary tract infection) 07/23/2022   Senile dementia (HCC) 06/29/2022   Pain in left foot 06/29/2022   PAD (peripheral artery disease) (HCC) 03/30/2022   Mild cognitive impairment 03/26/2022   Insomnia secondary to depression with anxiety 03/23/2022   Osteoarthritis, multiple sites 03/23/2022   Chronic pain of both shoulders 03/09/2022  Candidiasis of skin 03/09/2022   CKD (chronic kidney disease) stage 3, GFR 30-59 ml/min (HCC) 02/22/2022   Cerebral microvascular disease 02/19/2022   Hypokalemia, inadequate intake 02/19/2022   Cellulitis of left anterior lower leg 02/15/2022   Sepsis (HCC) 02/12/2022   Aortic valve disorder 10/19/2021   Congestive heart failure (HCC) 10/19/2021   Eczema 10/19/2021   Other specified disorders of bone density and structure, other  site 10/19/2021   History of colonic polyps 10/19/2021   Pure hypertriglyceridemia 10/19/2021   Urinary incontinence 10/19/2021   Sinus node dysfunction (HCC) 10/19/2021   Esophageal dysphagia 10/19/2021   Secondary hypercoagulable state (HCC) 12/30/2019   Lower extremity edema 01/22/2018   History of total knee replacement, right 10/25/2017   Osteoarthritis of left knee 10/25/2017   Persistent atrial fibrillation (HCC)    Hypertension    Hair loss    Aortic stenosis 03/08/2016   Chronic anticoagulation 12/17/2014   Mitral regurgitation 05/03/2014   Edema leg 04/28/2014   GERD (gastroesophageal reflux disease) 04/28/2014   Fatigue 12/26/2011   Ejection fraction    Normal nuclear stress test    Nausea    Brady-tachy syndrome (HCC)    Hypothyroidism    Paroxysmal atrial fibrillation [facility])    Drug therapy    Drug therapy    Pacemaker    Cough    Essential hypertension, benign 07/20/2010   PCP:  Tye Gall, MD Pharmacy:   [facility]: [facility] - [place], [place] - [address] [place] [address] Phone: [phone] Fax: [phone]  [facility] - [place], [place] - [address] [place] [phone] Phone: [phone] Fax: [phone]     Social Drivers of Health (SDOH) Social History: SDOH Screenings   Food Insecurity: No Food Insecurity (11/06/2023)  Housing: Low Risk  (11/06/2023)  Transportation Needs: No Transportation Needs (11/06/2023)  Utilities: Not At Risk (11/07/2023)  Depression (PHQ2-9): Low Risk  (04/09/2023)  Social Connections: Moderately Integrated (11/06/2023)  Tobacco Use: Medium Risk (11/06/2023)   SDOH Interventions: Food Insecurity Interventions: Intervention Not Indicated Housing Interventions: Patient Declined   Readmission Risk Interventions     No data to display

## 2023-11-08 NOTE — [place] FL2 (Signed)
 [place]  MEDICAID FL2 LEVEL OF CARE FORM     IDENTIFICATION  Patient Name: [name] Birthdate: 12/04/1927 Sex: female Admission Date (Current Location): 11/06/2023  [facility] and [place] Number:  Producer, television/film/video and Address:  [facility], [address] [place], [place] [address]      Provider Number: [id number]  Attending Physician Name and Address:  Eveline Hipps, DO  Relative Name and Phone Number:  [facility] Daughter [phone]  [phone]    Current Level of Care: Hospital Recommended Level of Care: Skilled Nursing Facility Prior Approval Number:    Date Approved/Denied:   PASRR Number: [id number]  Discharge Plan: SNF    Current Diagnoses: Patient Active Problem List   Diagnosis Date Noted   Closed displaced intertrochanteric fracture of right femur, initial encounter (HCC) 11/06/2023   NSTEMI (non-ST elevated myocardial infarction) (HCC) 09/14/2023   Chest pain 09/13/2023   Left shoulder pain 06/18/2023   Acute gastritis 05/27/2023   BRBPR (bright red blood per rectum) 05/24/2023   COVID-19 virus infection 03/11/2023   Infected wound 01/24/2023   Contusion of great toe of left foot 01/13/2023   Numbness of fingers of both hands 09/18/2022   Fall 08/24/2022   UTI (urinary tract infection) 07/23/2022   Senile dementia (HCC) 06/29/2022   Pain in left foot 06/29/2022   PAD (peripheral artery disease) (HCC) 03/30/2022   Mild cognitive impairment 03/26/2022   Insomnia secondary to depression with anxiety 03/23/2022   Osteoarthritis, multiple sites 03/23/2022   Chronic pain of both shoulders 03/09/2022   Candidiasis of skin 03/09/2022   CKD (chronic kidney disease) stage 3, GFR 30-59 ml/min (HCC) 02/22/2022   Cerebral microvascular disease 02/19/2022   Hypokalemia, inadequate intake 02/19/2022   Cellulitis of left anterior lower leg 02/15/2022   Sepsis (HCC) 02/12/2022   Aortic valve disorder 10/19/2021    Congestive heart failure (HCC) 10/19/2021   Eczema 10/19/2021   Other specified disorders of bone density and structure, other site 10/19/2021   History of colonic polyps 10/19/2021   Pure hypertriglyceridemia 10/19/2021   Urinary incontinence 10/19/2021   Sinus node dysfunction (HCC) 10/19/2021   Esophageal dysphagia 10/19/2021   Secondary hypercoagulable state (HCC) 12/30/2019   Lower extremity edema 01/22/2018   History of total knee replacement, right 10/25/2017   Osteoarthritis of left knee 10/25/2017   Persistent atrial fibrillation (HCC)    Hypertension    Hair loss    Aortic stenosis 03/08/2016   Chronic anticoagulation 12/17/2014   Mitral regurgitation 05/03/2014   Edema leg 04/28/2014   GERD (gastroesophageal reflux disease) 04/28/2014   Fatigue 12/26/2011   Ejection fraction    Normal nuclear stress test    Nausea    Brady-tachy syndrome (HCC)    Hypothyroidism    Paroxysmal atrial fibrillation (HCC)    Drug therapy    Drug therapy    Pacemaker    Cough    Essential hypertension, benign 07/20/2010    Orientation RESPIRATION BLADDER Height & Weight     Self, Place  O2 Incontinent Weight: (P) 200 lb (90.7 kg) Height:  5\' 3"  (160 cm)  BEHAVIORAL SYMPTOMS/MOOD NEUROLOGICAL BOWEL NUTRITION STATUS      Incontinent Diet (see discharge summary)  AMBULATORY STATUS COMMUNICATION OF NEEDS Skin   Total Care Verbally Surgical wounds, Other (Comment) (redness)                       Personal Care Assistance Level of Assistance  Bathing, Feeding, Dressing, Total care Bathing Assistance: Maximum assistance Feeding assistance: Limited assistance Dressing Assistance: Maximum assistance Total Care Assistance: Maximum assistance   Functional Limitations Info  Sight, Hearing, Speech Sight Info: Adequate Hearing Info: Impaired Speech Info: Adequate    SPECIAL CARE FACTORS FREQUENCY  PT (By licensed PT), OT (By licensed OT)     PT Frequency: 5x week OT Frequency:  5x week            Contractures Contractures Info: Not present    Additional Factors Info  Code Status, Allergies Code Status Info: DNR Allergies Info: Morphine And Codeine, Penicillins, Lasix  (Furosemide ), Aspirin , Clindamycin/lincomycin, Crestor  (Rosuvastatin ), Diltiazem , Erythromycin, Lisinopril, Metronidazole, Morphine, Penicillins Cross Reactors           Current Medications (11/08/2023):  This is the current hospital active medication list Current Facility-Administered Medications  Medication Dose Route Frequency Provider Last Rate Last Admin   0.9 %  sodium chloride  infusion   Intravenous Continuous Sheikh, Omair Latif, DO 75 mL/hr at 11/08/23 1610 New Bag at 11/08/23 9604   acetaminophen  (TYLENOL ) tablet 1,000 mg  1,000 mg Oral TID Wilhelmenia Harada, MD   1,000 mg at 11/08/23 5409   amiodarone  (PACERONE ) tablet 200 mg  200 mg Oral QHS Wilhelmenia Harada, MD   200 mg at 11/07/23 2126   apixaban  (ELIQUIS ) tablet 5 mg  5 mg Oral BID Sheikh, Omair Latif, DO   5 mg at 11/08/23 8119   brimonidine  (ALPHAGAN ) 0.2 % ophthalmic solution 1 drop  1 drop Right Eye Q12H Wilhelmenia Harada, MD   1 drop at 11/08/23 1478   calcium -vitamin D (OSCAL WITH D) 500-5 MG-MCG per tablet 1 tablet  1 tablet Oral Q breakfast Wilhelmenia Harada, MD   1 tablet at 11/08/23 2956   Chlorhexidine  Gluconate Cloth 2 % PADS 6 each  6 each Topical Daily Sheikh, Omair Latif, DO       dextromethorphan-guaiFENesin  (MUCINEX  DM) 30-600 MG per 12 hr tablet 1 tablet  1 tablet Oral BID Wilhelmenia Harada, MD   1 tablet at 11/08/23 0909   HYDROcodone-acetaminophen  (NORCO/VICODIN) 5-325 MG per tablet 1 tablet  1 tablet Oral Q6H PRN Wilhelmenia Harada, MD   1 tablet at 11/07/23 1516   HYDROmorphone (DILAUDID) injection 0.5 mg  0.5 mg Intravenous Q6H PRN Wilhelmenia Harada, MD   0.5 mg at 11/08/23 0541   levothyroxine  (SYNTHROID ) tablet 100 mcg  100 mcg Oral Q0600 Wilhelmenia Harada, MD   100 mcg at 11/08/23 0542   lidocaine  (LIDODERM ) 5 % 1 patch   1 patch Transdermal Q24H Wilhelmenia Harada, MD   1 patch at 11/06/23 2034   memantine  (NAMENDA ) tablet 10 mg  10 mg Oral BID Bokshan, Steven, MD   10 mg at 11/08/23 2130   multivitamin with minerals tablet 1 tablet  1 tablet Oral Daily Wilhelmenia Harada, MD   1 tablet at 11/08/23 0909   mupirocin  ointment (BACTROBAN ) 2 % 1 Application  1 Application Nasal BID Sheikh, Omair Latif, DO   1 Application at 11/08/23 8657   ondansetron  (ZOFRAN ) tablet 4 mg  4 mg Oral Q6H PRN Wilhelmenia Harada, MD       Or   ondansetron  (ZOFRAN ) injection 4 mg  4 mg Intravenous Q6H PRN Wilhelmenia Harada, MD   4 mg at 11/07/23 8469   polyvinyl alcohol (LIQUIFILM TEARS) 1.4 % ophthalmic solution 1 drop  1 drop Right Eye TID Bokshan, Steven, MD   1 drop at 11/08/23 0912   potassium chloride  (KLOR-CON  M) CR tablet  10 mEq  10 mEq Oral Daily Wilhelmenia Harada, MD   10 mEq at 11/08/23 7846   senna-docusate (Senokot-S) tablet 1 tablet  1 tablet Oral QHS PRN Wilhelmenia Harada, MD       timolol  (TIMOPTIC ) 0.5 % ophthalmic solution 1 drop  1 drop Right Eye Q12H Wilhelmenia Harada, MD   1 drop at 11/08/23 9629     Discharge Medications: Please see discharge summary for a list of discharge medications.  Relevant Imaging Results:  Relevant Lab Results:   Additional Information SSN: [id number]  Elspeth Hals, LCSW

## 2023-11-08 NOTE — Progress Notes (Signed)
 PROGRESS NOTE    [name]  UJW:[id number] DOB: 11-Sep-1927 DOA: 11/06/2023 PCP: [name] Gall, MD   Brief Narrative:  Patient is a 89 year old elderly Caucasian female with a past medical history significant for A-fib on anticoagulation, chronic diastolic CHF, history of aortic stenosis, history of tachybradycardia syndrome and sick sinus dysfunction status post pacemaker, GERD, Sendil dementia, essential hypertension, hypothyroidism as well as other comorbid's who presented from her ALF after a fall.  Patient was ambulating and leaving her bathroom and she tripped on the edge of the rug and fell on her right side.  Further workup was done including a trauma scan and CT pelvis showed an acute comminuted and markedly displaced right intratrochanteric femur fracture.  Orthopedic surgery was consulted and her anticoagulation was held and plan is for surgical intervention with intramedullary nail of the femur. She is POD 1 and now position has been complicated by an AKI, acute blood loss anemia as well as some hypoxia.  Assessment and Plan:  Mechanical fall with resultant Right Femur Fracture: Elderly patient presented from ALF after sustaining right hip pain from a mechanical fall. Hip imaging shows closed displaced intertrochanteric fracture of the right femur. Orthopedic surgery consulted, plan for intramedullary nailing of the femur 11/07/23 at 1530. C/w Multimodal pain control with scheduled Acetaminophen  1000 mg po q8h, Lidocaine  Patch 1 Patch TD q24h as well as Hydrocodone-Acetaminophen  5-325 1 tab po q6hprn Moderate Pain and given IV Fentanyl  50 mcg q30m minprn Severe Pain x3 doses -Follow-up vitamin D, 25-Hydroxy was 54.75 -PT/OT recommending SNF -Fall precautions -Bowel Regimen w/ Senna-Docusate 1 tab po qHSprn Mild Consitpation -WBAT per Orthopedic Surgery as well as Resumption of Apixaban ; D/w Dr. Hermina [name] and ok to resume Apixaban  5 mg po BID  Hypoxia: Had low saturations this AM.  Will start Flutter Valve and Incentive. CXR done and showed Enlarged cardiopericardial silhouette with vascular congestion and pacemaker. Also there was Subtle opacity at the bases favoring atelectasis.  Will add as needed albuterol nebs and repeat chest x-Hinderman in AM.  She has been on and off oxygen with saturations ranging from 87 to low 90s.  Encourage incentive spirometry and flutter valve and then reassess.   Paroxysmal A-fib /  Sinus node dysfunction s/p PPM: HR stable in the 60s. C/w Amiodarone  200 mg po at bedtime; Holding Apixaban  for now and will resume after Surgery once ok with Orthopedic Surgery   Chronic Diastolic Heart Failure Aortic Stenosis -Recent TTE from 09/14/2023 showed hyperdynamic EF >75%, severe LVH, G2DD, moderately dilated LA and moderate aortic stenosis. -No evidence of fluid overloaded on CXR but Patient slightly dry on exam so was initiated on IVF with NS @ 75 mL/hr x1 Day and resumed given AKI -Initially held diuretics on Admission but Resumed Furosemide  20 mg po daily today  -Continue potassium supplementation w/ po KCL 10 mEQ Daily   Glaucoma: C/w Brimonidine  0.2% Opthalmic Solution in Right Eye q12h, Timolol  0.5% Ophthalmic solution 1 drop Right Eye q12h, and Polyvinal Alcohol 1.4% Ophthalmic 1 Drop in R Eye TID   Hypothyroidism: TSH was 4.206. C/w Levothyroxine  100 mcg po Daily    Dementia: Alert and oriented x 3 but has poor memory. C/w Memantine  10 mg po BID. Checked Vitamin B12 and was 312  AKI: BUN/Cr Trend: Recent Labs  Lab 11/06/23 1652 11/07/23 0645 11/08/23 0656  BUN 18 19 26*  CREATININE 0.93 0.77 1.16*  -Check U/A, Urine Osm, Urine Cr, Urine Na+; Start back IVF Hydration and hold Lasix  -Avoid  Nephrotoxic Medications, Contrast Dyes, Hypotension and Dehydration to Ensure Adequate Renal Perfusion and will need to Renally Adjust Meds -Continue to Monitor and Trend Renal Function carefully and repeat CMP in the AM   Macrocytic Anemia/ Acute Blood Loss  Anemia in the setting of Post-Operative Drop: Hgb/Hct Trend went from 14.7/44.1 -> 13.1/39.6 -> 11.2/34.6. Check Anemia Panel in the AM. CTM for S/Sx of bleeding; No overt bleeding noted now but had some bleeding yesterday after surgery.  Thrombocytopenia: Plt Count went from 127 -> 116 -> 138. CTM for S/Sx of Bleeding; No overt bleeding noted.   Hypoalbuminemia: Patient's Albumin Level is 3.1. CTM and Trend and repeat CMP in the AM  Class I Obesity: Complicates overall prognosis and care. Estimated body mass index is 32.45 kg/m as calculated from the following:   Height as of this encounter: 5\' 5"  (1.651 m).   Weight as of this encounter: 88.5 kg. Weight Loss and Dietary Counseling given  DVT prophylaxis: SCDs Start: 11/06/23 1958 apixaban  (ELIQUIS ) tablet 5 mg    Code Status: Limited: Do not attempt resuscitation (DNR) -DNR-LIMITED -Do Not Intubate/DNI  Family Communication: D/w daughter @ bedside  Disposition Plan:  Level of care: Telemetry Medical Status is: Inpatient Remains inpatient appropriate because: Further clinical improvement and clearance by orthopedic surgeon likely will go to SNF eventually   Consultants:  Orthopedic Surgery  Procedures:  As delineated as above  Antimicrobials:  Anti-infectives (From admission, onward)    Start     Dose/Rate Route Frequency Ordered Stop   11/08/23 0200  ceFAZolin (ANCEF) IVPB 2g/100 mL premix        2 g 200 mL/hr over 30 Minutes Intravenous Every 8 hours 11/07/23 2208 11/08/23 0938   11/07/23 1616  ceFAZolin (ANCEF) 2-4 GM/100ML-% IVPB       Note to Pharmacy: [name]: cabinet override      11/07/23 1616 11/07/23 1807   11/07/23 1615  ceFAZolin (ANCEF) IVPB 2g/100 mL premix        2 g 200 mL/hr over 30 Minutes Intravenous  Once 11/07/23 1611 11/07/23 1754       Subjective: Seen and examined at bedside states that she is doing "okay".  No nausea or vomiting.  Has any chest pain or shortness of breath.  Does not  complain of very much pain right now.  No other concerns or complaints at this time.  Objective: Vitals:   11/08/23 0815 11/08/23 0908 11/08/23 1053 11/08/23 1543  BP: (!) 150/62 (!) 147/80 (!) 129/49 (!) 134/49  Pulse: 91 88 73 73  Resp: 20  20 20   Temp: (!) 97.5 F (36.4 C)   98.2 F (36.8 C)  TempSrc:      SpO2: (!) 82%   96%  Weight:      Height:        Intake/Output Summary (Last 24 hours) at 11/08/2023 1910 Last data filed at 11/08/2023 1200 Gross per 24 hour  Intake 499.43 ml  Output --  Net 499.43 ml   Filed Weights   11/06/23 1559 11/07/23 1547  Weight: 88.5 kg (P) 90.7 kg   Examination: Physical Exam:  Constitutional: WN/WD obese elderly Caucasian female who is in no acute distress appears calm Respiratory: Diminished to auscultation bilaterally with some coarse breath sounds and does have some slight rhonchi but no appreciable rales or crackles. Normal respiratory effort and patient is not tachypenic. No accessory muscle use.  Not wearing supplemental oxygen via nasal cannula Cardiovascular: RRR, no murmurs /  rubs / gallops. S1 and S2 auscultated.  Has some leg edema from her surgery. Abdomen: Soft, non-tender, distended secondary body habitus. Bowel sounds positive.  GU: Deferred. Musculoskeletal: No clubbing / cyanosis of digits/nails. No joint deformity upper and lower extremities Skin: No rashes, lesions, ulcers on limited skin evaluation. No induration; Warm and dry.  Incisions are covered and appear clean dry and intact. Neurologic: CN 2-12 grossly intact with no focal deficits. Romberg sign cerebellar reflexes not assessed.  Psychiatric: Normal judgment and insight.  She is awake and alert  Data Reviewed: I have personally reviewed following labs and imaging studies  CBC: Recent Labs  Lab 11/06/23 1652 11/07/23 0645 11/08/23 0656  WBC 7.2 8.2 11.3*  NEUTROABS 5.2  --  8.9*  HGB 14.7 13.1 11.2*  HCT 44.1 39.6 34.6*  MCV 100.7* 100.8* 102.7*  PLT  127* 116* 138*   Basic Metabolic Panel: Recent Labs  Lab 11/06/23 1652 11/07/23 0645 11/08/23 0656  NA 138 138 138  K 3.9 3.6 4.5  CL 99 105 106  CO2 26 25 23   GLUCOSE 124* 111* 148*  BUN 18 19 26*  CREATININE 0.93 0.77 1.16*  CALCIUM  9.6 8.9 8.6*  MG  --   --  2.1  PHOS  --   --  4.2   GFR: Estimated Creatinine Clearance: 31.3 mL/min (A) (by C-G formula based on SCr of 1.16 mg/dL (H)). Liver Function Tests: Recent Labs  Lab 11/07/23 0645 11/08/23 0656  AST 27 27  ALT 19 18  ALKPHOS 33* 31*  BILITOT 1.1 0.8  PROT 6.4* 6.1*  ALBUMIN 3.4* 3.1*   No results for input(s): "LIPASE", "AMYLASE" in the last 168 hours. No results for input(s): "AMMONIA" in the last 168 hours. Coagulation Profile: Recent Labs  Lab 11/06/23 1652  INR 1.2   Cardiac Enzymes: No results for input(s): "CKTOTAL", "CKMB", "CKMBINDEX", "TROPONINI" in the last 168 hours. BNP (last 3 results) No results for input(s): "PROBNP" in the last 8760 hours. HbA1C: Recent Labs    11/08/23 0656  HGBA1C 4.8   CBG: No results for input(s): "GLUCAP" in the last 168 hours. Lipid Profile: No results for input(s): "CHOL", "HDL", "LDLCALC", "TRIG", "CHOLHDL", "LDLDIRECT" in the last 72 hours. Thyroid  Function Tests: Recent Labs    11/07/23 0645  TSH 4.206   Anemia Panel: Recent Labs    11/07/23 0645  VITAMINB12 312   Sepsis Labs: No results for input(s): "PROCALCITON", "LATICACIDVEN" in the last 168 hours.  Recent Results (from the past 240 hours)  Surgical pcr screen     Status: Abnormal   Collection Time: 11/07/23  1:53 PM   Specimen: Nasal Mucosa; Nasal Swab  Result Value Ref Range Status   MRSA, PCR DETECTED (A) NEGATIVE Final    Comment: RESULT CALLED TO, READ BACK BY AND VERIFIED WITH: RN TStanton Earthly [phone] @1642  FH    Staphylococcus aureus POSITIVE (A) NEGATIVE Final    Comment: (NOTE) The Xpert SA Assay (FDA approved for NASAL specimens in patients 61 years of age and older), is  one component of a comprehensive surveillance program. It is not intended to diagnose infection nor to guide or monitor treatment. Performed at [facility], [address], [place], [place] [address]     Radiology Studies: DG CHEST PORT 1 VIEW Result Date: 11/08/2023 CLINICAL DATA:  Shortness of breath EXAM: PORTABLE CHEST 1 VIEW COMPARISON:  Chest x-So[place]anes 11/06/2023 FINDINGS: Enlarged cardiopericardial silhouette. Calcified aorta. Prominent central vascular congestion. Left upper chest  pacemaker leads along the right side of the heart. Subtle opacity at the lung bases. Atelectasis is favored. Recommend follow-up. No pneumothorax. No effusion or consolidation. Films are under penetrated. IMPRESSION: Enlarged cardiopericardial silhouette with vascular congestion and pacemaker. Subtle opacity at the bases, favor atelectasis. Recommend follow-up. Electronically Signed   By: Adrianna Horde M.D.   On: 11/08/2023 14:59   DG FEMUR, MIN 2 VIEWS RIGHT Result Date: 11/07/2023 CLINICAL DATA:  Elective surgery. EXAM: RIGHT FEMUR 2 VIEWS COMPARISON:  Preoperative imaging FINDINGS: Nine fluoroscopic spot views of the right femur submitted from the operating room. Femoral intramedullary nail with trans trochanteric and distal locking screw fixation of proximal femur fracture. Fluoroscopy time 176.7 seconds. Dose 71.17 mGy. IMPRESSION: Intraoperative fluoroscopy during proximal femur fracture fixation. Electronically Signed   By: Chadwick Colonel M.D.   On: 11/07/2023 20:22   DG C-Arm 1-60 Min-No Report Result Date: 11/07/2023 Fluoroscopy was utilized by the requesting physician.  No radiographic interpretation.   DG C-Arm 1-60 Min-No Report Result Date: 11/07/2023 Fluoroscopy was utilized by the requesting physician.  No radiographic interpretation.   Scheduled Meds:  acetaminophen   1,000 mg Oral TID   amiodarone   200 mg Oral QHS   apixaban   5 mg Oral BID   brimonidine   1 drop Right Eye Q12H    calcium -vitamin D   1 tablet Oral Q breakfast   Chlorhexidine  Gluconate Cloth  6 each Topical Daily   dextromethorphan -guaiFENesin   1 tablet Oral BID   levothyroxine   100 mcg Oral Q0600   lidocaine   1 patch Transdermal Q24H   memantine   10 mg Oral BID   multivitamin with minerals  1 tablet Oral Daily   mupirocin  ointment  1 Application Nasal BID   polyvinyl alcohol   1 drop Right Eye TID   potassium chloride   10 mEq Oral Daily   timolol   1 drop Right Eye Q12H   Continuous Infusions:  sodium chloride  75 mL/hr at 11/08/23 0906    LOS: 2 days   Aura Leeds, DO [facility] Available via Epic secure chat 7am-7pm After these hours, please refer to coverage provider listed on [email or web address] 11/08/2023, 7:10 PM

## 2023-11-08 NOTE — Care Management Important Message (Signed)
 Important Message  Patient Details  Name: [name] MRN: [id number] Date of Birth: 01/31/1928   Important Message Given:  Yes - Medicare IM     Felix Host 11/08/2023, 2:33 PM

## 2023-11-08 NOTE — Progress Notes (Signed)
   Subjective:  Patient reports pain as moderate. Voiding. Pending PT today.  Objective:   VITALS:   Vitals:   11/07/23 2021 11/08/23 0518 11/08/23 0601 11/08/23 0607  BP: (!) 155/51 (!) 155/64 (!) 137/56   Pulse: 74 88 85   Resp: 18 18 18    Temp: 98.4 F (36.9 C) (!) 97.5 F (36.4 C)    TempSrc: Oral     SpO2: 93% (!) 88%  94%  Weight:      Height:        Neurologically intact Neurovascular intact Sensation intact distally Dressing CDI  Lab Results  Component Value Date   WBC 8.2 11/07/2023   HGB 13.1 11/07/2023   HCT 39.6 11/07/2023   MCV 100.8 (H) 11/07/2023   PLT 116 (L) 11/07/2023     Assessment/Plan:  1 Day Post-Op s/p right hip nailing, doing well  - Expected postop acute blood loss anemia - will monitor for symptoms - Patient to work with PT/OT to optimize mobilization safely - DVT ppx - SCDs, ambulation, Aspirin  325 daily - Postoperative Abx: Ancef  x 2 additional doses given - WBAT operative extremity - Pain control - multimodal pain management, ATC acetaminophen  in conjunction with as needed narcotic (oxycodone ), although this should be minimized with other modalities    [name] 11/08/2023, 7:19 AM

## 2023-11-08 NOTE — Progress Notes (Signed)
 OT Cancellation Note  Patient Details Name: [name] MRN: [id number] DOB: 1927/10/18   Cancelled Treatment:    Reason Eval/Treat Not Completed: Other (comment) Per RN, patient worked with PT earlier, and was lethargic and could not participate much. RN asking OT to circle back to complete evaluation in hopes that patient is more alert and participatory.  Mollie Anger E. Ammi Hutt, OTR/L Acute Rehabilitation Services [phone]   Vincent Greek 11/08/2023, 9:29 AM

## 2023-11-08 NOTE — Consult Note (Signed)
 WOC Nurse Consult Note: Reason for Consult: red moist areas underneath abdominal folds  Wound type: Intertriginous dermatitis r/t moisture and friction  ICD-10 CM Codes for Irritant Dermatitis  L30.4  - Erythema intertrigo. Also used for abrasion of the hand, chafing of the skin, dermatitis due to sweating and friction, friction dermatitis, friction eczema, and genital/thigh intertrigo.  Pressure Injury POA: NA not related to pressure   Measurement: widespread underneath abdominal folds (pannus)  Wound bed: red moist with partial thickness skin loss  Drainage (amount, consistency, odor)  Periwound: Dressing procedure/placement/frequency: Cleanse underneath pannus/abdominal folds with Vashe wound cleanser Timm Foot [phone]) do not rinse and allow to air dry. Sprinkle floor stock microguard antifungal powder (white and green label) to area then apply Interdry AG as follows:  Order Timm Foot # [id number] Measure and cut length of InterDry to fit in skin folds that have skin breakdown Tuck InterDry fabric into skin folds in a single layer, allow for 2 inches of overhang from skin edges to allow for wicking to occur May remove to bathe; dry area thoroughly and then tuck into affected areas again Do not apply any creams or ointments when using InterDry DO NOT THROW AWAY FOR 5 DAYS unless soiled with stool DO NOT [facility] product, this will inactivate the silver in the material  New sheet of Interdry should be applied after 5 days of use if patient continues to have skin breakdown    POC discussed with bedside nurse. WOC team will not follow. Re-consult if further needs arise.   Thank you,    Alexey Rhoads MSN, RN-BC, CWOCN [phone]  Conservative sharp wound debridement (CSWD performed at the bedside):

## 2023-11-09 ENCOUNTER — Inpatient Hospital Stay (HOSPITAL_COMMUNITY)

## 2023-11-09 DIAGNOSIS — S72141A Displaced intertrochanteric fracture of right femur, initial encounter for closed fracture: Secondary | ICD-10-CM | POA: Diagnosis not present

## 2023-11-09 DIAGNOSIS — N179 Acute kidney failure, unspecified: Secondary | ICD-10-CM | POA: Diagnosis not present

## 2023-11-09 DIAGNOSIS — D696 Thrombocytopenia, unspecified: Secondary | ICD-10-CM | POA: Diagnosis not present

## 2023-11-09 DIAGNOSIS — I5033 Acute on chronic diastolic (congestive) heart failure: Secondary | ICD-10-CM

## 2023-11-09 DIAGNOSIS — I480 Paroxysmal atrial fibrillation: Secondary | ICD-10-CM | POA: Diagnosis not present

## 2023-11-09 LAB — CBC WITH DIFFERENTIAL/PLATELET
Abs Immature Granulocytes: 0.11 10*3/uL — ABNORMAL HIGH (ref 0.00–0.07)
Basophils Absolute: 0.1 10*3/uL (ref 0.0–0.1)
Basophils Relative: 1 %
Eosinophils Absolute: 0.3 10*3/uL (ref 0.0–0.5)
Eosinophils Relative: 2 %
HCT: 26.8 % — ABNORMAL LOW (ref 36.0–46.0)
Hemoglobin: 8.6 g/dL — ABNORMAL LOW (ref 12.0–15.0)
Immature Granulocytes: 1 %
Lymphocytes Relative: 16 %
Lymphs Abs: 2.1 10*3/uL (ref 0.7–4.0)
MCH: 33 pg (ref 26.0–34.0)
MCHC: 32.1 g/dL (ref 30.0–36.0)
MCV: 102.7 fL — ABNORMAL HIGH (ref 80.0–100.0)
Monocytes Absolute: 1.9 10*3/uL — ABNORMAL HIGH (ref 0.1–1.0)
Monocytes Relative: 14 %
Neutro Abs: 8.8 10*3/uL — ABNORMAL HIGH (ref 1.7–7.7)
Neutrophils Relative %: 66 %
Platelets: 130 10*3/uL — ABNORMAL LOW (ref 150–400)
RBC: 2.61 MIL/uL — ABNORMAL LOW (ref 3.87–5.11)
RDW: 13.3 % (ref 11.5–15.5)
WBC: 13.3 10*3/uL — ABNORMAL HIGH (ref 4.0–10.5)
nRBC: 0.2 % (ref 0.0–0.2)

## 2023-11-09 LAB — COMPREHENSIVE METABOLIC PANEL WITH GFR
ALT: 13 U/L (ref 0–44)
AST: 23 U/L (ref 15–41)
Albumin: 2.8 g/dL — ABNORMAL LOW (ref 3.5–5.0)
Alkaline Phosphatase: 25 U/L — ABNORMAL LOW (ref 38–126)
Anion gap: 9 (ref 5–15)
BUN: 31 mg/dL — ABNORMAL HIGH (ref 8–23)
CO2: 27 mmol/L (ref 22–32)
Calcium: 8.1 mg/dL — ABNORMAL LOW (ref 8.9–10.3)
Chloride: 102 mmol/L (ref 98–111)
Creatinine, Ser: 0.84 mg/dL (ref 0.44–1.00)
GFR, Estimated: >60 mL/min (ref >60)
Glucose, Bld: 121 mg/dL — ABNORMAL HIGH (ref 70–99)
Potassium: 4.2 mmol/L (ref 3.5–5.1)
Sodium: 138 mmol/L (ref 135–145)
Total Bilirubin: 0.7 mg/dL (ref 0.0–1.2)
Total Protein: 5.5 g/dL — ABNORMAL LOW (ref 6.5–8.1)

## 2023-11-09 LAB — MAGNESIUM: Magnesium: 2.1 mg/dL (ref 1.7–2.4)

## 2023-11-09 LAB — PHOSPHORUS: Phosphorus: 1.9 mg/dL — ABNORMAL LOW (ref 2.5–4.6)

## 2023-11-09 MED ORDER — FUROSEMIDE 10 MG/ML IJ SOLN
20.0000 mg | Freq: Once | INTRAMUSCULAR | Status: AC
  Administered 2023-11-09: 20 mg via INTRAVENOUS
  Filled 2023-11-09: qty 2

## 2023-11-09 MED ORDER — POLYETHYLENE GLYCOL 3350 17 G PO PACK
17.0000 g | PACK | Freq: Two times a day (BID) | ORAL | Status: DC
  Administered 2023-11-09 – 2023-11-12 (×6): 17 g via ORAL
  Filled 2023-11-09 (×7): qty 1

## 2023-11-09 MED ORDER — SENNOSIDES-DOCUSATE SODIUM 8.6-50 MG PO TABS
1.0000 | ORAL_TABLET | Freq: Two times a day (BID) | ORAL | Status: DC
  Administered 2023-11-09 – 2023-11-12 (×7): 1 via ORAL
  Filled 2023-11-09 (×7): qty 1

## 2023-11-09 MED ORDER — BISACODYL 10 MG RE SUPP: 10.0000 mg | Freq: Every day | RECTAL | Status: DC | PRN

## 2023-11-09 MED ORDER — K PHOS MONO-SOD PHOS DI & MONO 155-852-130 MG PO TABS
500.0000 mg | ORAL_TABLET | Freq: Two times a day (BID) | ORAL | Status: AC
  Administered 2023-11-09 (×2): 500 mg via ORAL
  Filled 2023-11-09 (×2): qty 2

## 2023-11-09 NOTE — Progress Notes (Signed)
 Physical Therapy Treatment Patient Details Name: [name] MRN: [id number] DOB: May 24, 1928 Today's Date: 11/09/2023   History of Present Illness 89 y.o. female presents to [facility] ED on 11/06/23 from ALF after a fall from attempting to stand. Pt sustained displaced intertrochanteric fx of R hip. Pt is s/p R hip cephulomedullary nail on 11/07/23, WBAT. PMHx: moderate to severe aortic stenosis, sick sinus syndrome status post pacemaker placement, paroxysmal A-fib on Eliquis , hypertension, hypothyroidism and senile dementia.    PT Comments  Pt received in supine and reporting need for BM. Pt agreeable to attempt transfer to [facility]. Pt requires max A +2 for bed mobility and standing attempts due to RLE pain limiting mobility tolerance. Pt demonstrates improved standing tolerance to attempt pivot to [facility]. Pt able to slide RLE back, however is unable to move LLE. Pt able to roll to the L for bedpan placement with max A. Pt continues to benefit from PT services to progress toward functional mobility goals.      If plan is discharge home, recommend the following: A lot of help with walking and/or transfers;A lot of help with bathing/dressing/bathroom;Assist for transportation;Supervision due to cognitive status   Can travel by private vehicle        Equipment Recommendations  None recommended by PT    Recommendations for Other Services       Precautions / Restrictions Precautions Precautions: Fall Recall of Precautions/Restrictions: Impaired Restrictions Weight Bearing Restrictions Per Provider Order: Yes RLE Weight Bearing Per Provider Order: Weight bearing as tolerated     Mobility  Bed Mobility Overal bed mobility: Needs Assistance Bed Mobility: Supine to Sit, Sit to Supine, Rolling Rolling: Max assist   Supine to sit: Max assist, HOB elevated, Used rails, +2 for physical assistance Sit to supine: Max assist, HOB elevated, Used rails, +2 for physical assistance   General bed mobility  comments: max A +2 for all aspects with pt destracted by pain    Transfers Overall transfer level: Needs assistance Equipment used: None Transfers: Sit to/from Stand Sit to Stand: Max assist, +2 physical assistance           General transfer comment: STS from EOB x2 with improved initiation from pt with max A +2 for power up. Assist and cues for hand placement on RW due to pt attempting to lean down and grab the front of RW. Attepted a pivot to [facility] x2 with pt able to slide RLE back, but is unable to tolerate WB or support enough with BUE on RW to move LLE.    Ambulation/Gait                   Stairs             Wheelchair Mobility     Tilt Bed    Modified Rankin (Stroke Patients Only)       Balance Overall balance assessment: Needs assistance Sitting-balance support: Feet supported, Single extremity supported, Bilateral upper extremity supported Sitting balance-Leahy Scale: Poor Sitting balance - Comments: sitting EOB                                    Communication Communication Communication: No apparent difficulties  Cognition Arousal: Alert Behavior During Therapy: Flat affect, Anxious   PT - Cognitive impairments: History of cognitive impairments, Orientation, Awareness, Memory, Attention, Initiation, Sequencing, Problem solving, Safety/Judgement  Following commands: Impaired Following commands impaired: Follows one step commands inconsistently    Cueing Cueing Techniques: Verbal cues, Tactile cues, Visual cues  Exercises      General Comments        Pertinent Vitals/Pain Pain Assessment Pain Assessment: Faces Faces Pain Scale: Hurts whole lot Pain Location: RLE Pain Descriptors / Indicators: Grimacing, Guarding, Moaning Pain Intervention(s): Limited activity within patient's tolerance, Monitored during session     PT Goals (current goals can now be found in the care plan section)  Acute Rehab PT Goals Patient Stated Goal: pt unable to state goal at this time PT Goal Formulation: With patient/family Time For Goal Achievement: 11/22/23 Progress towards PT goals: Progressing toward goals    Frequency    Min 3X/week       AM-PAC PT "6 Clicks" Mobility   Outcome Measure  Help needed turning from your back to your side while in a flat bed without using bedrails?: A Lot Help needed moving from lying on your back to sitting on the side of a flat bed without using bedrails?: Total Help needed moving to and from a bed to a chair (including a wheelchair)?: Total Help needed standing up from a chair using your arms (e.g., wheelchair or bedside chair)?: Total Help needed to walk in hospital room?: Total Help needed climbing 3-5 steps with a railing? : Total 6 Click Score: 7    End of Session Equipment Utilized During Treatment: Gait belt;Oxygen Activity Tolerance: Patient limited by pain Patient left: in bed;with call bell/phone within reach;with family/visitor present;with nursing/sitter in room;with bed alarm set Nurse Communication: Mobility status PT Visit Diagnosis: Unsteadiness on feet (R26.81);Muscle weakness (generalized) (M62.81);History of falling (Z91.81);Pain     Time: 1610-9604 PT Time Calculation (min) (ACUTE ONLY): 33 min  Charges:    $Therapeutic Activity: 23-37 mins PT General Charges $$ ACUTE PT VISIT: 1 Visit                     Michaelle Adolphus, PTA Acute Rehabilitation Services Secure Chat Preferred  [phone]    Michaelle Adolphus 11/09/2023, 4:03 PM

## 2023-11-09 NOTE — Progress Notes (Signed)
 RN attempt dressing change. No interdry found available on unit. RN Medical laboratory scientific officer to order dressing change supplies for abdominal folds. Nighthsift RN aware.

## 2023-11-09 NOTE — Progress Notes (Signed)
 PROGRESS NOTE    [name]  JXB:[id number] DOB: [birth year] DOA: 11/06/2023 PCP: Tye Gall, MD   Brief Narrative:  Patient is a 89 year old elderly Caucasian female with a past medical history significant for A-fib on anticoagulation, chronic diastolic CHF, history of aortic stenosis, history of tachybradycardia syndrome and sick sinus dysfunction status post pacemaker, GERD, Sendil dementia, essential hypertension, hypothyroidism as well as other comorbid's who presented from her ALF after a fall.  Patient was ambulating and leaving her bathroom and she tripped on the edge of the rug and fell on her right side.  Further workup was done including a trauma scan and CT pelvis showed an acute comminuted and markedly displaced right intratrochanteric femur fracture.  Orthopedic surgery was consulted and her anticoagulation was held and plan is for surgical intervention with intramedullary nail of the femur. She is POD 2 and now position has been complicated by an AKI, acute blood loss anemia as well acute respiratory failure with hypoxia and acute on chronic diastolic CHF.  She has now been initiated on diuresis and IV fluid hydration has not been discontinued given that her AKI is improved  Assessment and Plan:  Mechanical fall with resultant Right Femur Fracture: Elderly patient presented from ALF after sustaining right hip pain from a mechanical fall. Hip imaging shows closed displaced intertrochanteric fracture of the right femur. Orthopedic surgery consulted, plan for intramedullary nailing of the femur 11/07/23 at 1530. C/w Multimodal pain control with scheduled Acetaminophen  1000 mg po q8h, Lidocaine  Patch 1 Patch TD q24h as well as Hydrocodone -Acetaminophen  5-325 1 tab po q6hprn Moderate Pain and given IV Fentanyl  50 mcg q30m minprn Severe Pain x3 doses -Follow-up vitamin D , 25-Hydroxy was 54.75 -PT/OT recommending SNF -Fall precautions -Bowel Regimen w/ Senna-Docusate 1 tab po  qHSprn Mild Consitpation -WBAT per Orthopedic Surgery as well as Resumption of Apixaban ; D/w Dr. Hermina Loosen and ok to resume Apixaban  5 mg po BID  Acute Respiratory Failure w/ Hypoxia in the setting of Acute on Chronic Diastolic CHF: Had low saturations this AM. Will start Flutter Valve and Incentive. CXR done yesterday and showed Enlarged cardiopericardial silhouette with vascular congestion and pacemaker. Also there was Subtle opacity at the bases favoring atelectasis.  Will add as needed albuterol  nebs and repeat chest x-[place] this AM as below.  She has been on and off oxygen with saturations ranging from 87 to low 90s.  Encourage incentive spirometry and flutter valve and then reassess. Start Diuresis as below   Paroxysmal A-fib /  Sinus node dysfunction s/p PPM: HR stable in the 60s. C/w Amiodarone  200 mg po at bedtime; Apixaban  resumed   Acute on Chronic Diastolic Heart Failure Aortic Stenosis -Recent TTE from 09/14/2023 showed hyperdynamic EF >75%, severe LVH, G2DD, moderately dilated LA and moderate aortic stenosis. -Repeat CXR shows findings suggestive of CHF with prominent cardiac silhouette and pulmonary interstitial prominence with vascular congestion -Initially held diuretics on Admission and held again due yo AKI but will give a dose of IV Furosemide  20 mg x1 and will repeat this evening  -Strict I's and O's and Daily Weights; Patient is +1,090.7 -Continue potassium supplementation w/ po KCL 10 mEQ Daily  -Repeat CXR in the AM  Leukocytosis: Likely reactive in the setting of Surgery. WBC trending up and went from 8.2 -> 11.3 -> 13.3 . CTM for S/Sx of Infection. Repeat CBC in the AM   Hypophosphatemia: Phos Level is now 1.9. Replete with po K Phos Neutral 500 mg po BID x2. CTM  and Replete as Necessary. Repeat Phos Level in the AM  Glaucoma: C/w Brimonidine  0.2% Opthalmic Solution in Right Eye q12h, Timolol  0.5% Ophthalmic solution 1 drop Right Eye q12h, and Polyvinal Alcohol  1.4% Ophthalmic  1 Drop in R Eye TID   Hypothyroidism: TSH was 4.206. C/w Levothyroxine  100 mcg po Daily    Dementia: Alert and oriented x 3 but has poor memory. C/w Memantine  10 mg po BID. Checked Vitamin B12 and was 312  Constipation: Change PRN Senna-Docusate to scheduled 1 tab po BID; Also add Miralax  17 gram BID and Bisacodyl 10 mg RC PRN  AKI: BUN/Cr Trend: improved  Recent Labs  Lab 11/06/23 1652 11/07/23 0645 11/08/23 0656 11/09/23 0617  BUN 18 19 26* 31*  CREATININE 0.93 0.77 1.16* 0.84  -Check U/A, Urine Osm, Urine Cr, Urine Na+; IVF now stopped and will reinitiate IV Lasix  -Avoid Nephrotoxic Medications, Contrast Dyes, Hypotension and Dehydration to Ensure Adequate Renal Perfusion and will need to Renally Adjust Meds -Continue to Monitor and Trend Renal Function carefully and repeat CMP in the AM   Macrocytic Anemia/ Acute Blood Loss Anemia in the setting of Post-Operative Drop: Hgb/Hct Trend went from 14.7/44.1 -> 13.1/39.6 -> 11.2/34.6 -> 8.6/26.8. Check Anemia Panel in the AM. CTM for S/Sx of bleeding; No overt bleeding noted now but had some bleeding yesterday after surgery.  Thrombocytopenia: Plt Count went from 127 -> 116 -> 138 -> 130. CTM for S/Sx of Bleeding; No overt bleeding noted.   Hypoalbuminemia: Patient's Albumin Level went from 3.1 -> 2.8. CTM and Trend and repeat CMP in the AM  Class I Obesity: Complicates overall prognosis and care. Estimated body mass index is 35.43 kg/m (pended) as calculated from the following:   Height as of this encounter: 5\' 3"  (1.6 m).   Weight as of this encounter: (P) 90.7 kg. Weight Loss and Dietary Counseling given   DVT prophylaxis: SCDs Start: 11/06/23 1958 apixaban  (ELIQUIS ) tablet 5 mg    Code Status: Limited: Do not attempt resuscitation (DNR) -DNR-LIMITED -Do Not Intubate/DNI  Family Communication: D/w Family present @ Bedside  Disposition Plan:  Level of care: Telemetry Medical Status is: Inpatient Remains inpatient appropriate  because: Needs further clinical improvement and needs to be diuresed back to dry weight and ensure that she is not losing more blood.  PT OT recommending SNF.   Consultants:  Orthopedic Surgery  Procedures:  As delineated as above  Antimicrobials:  Anti-infectives (From admission, onward)    Start     Dose/Rate Route Frequency Ordered Stop   11/08/23 0200  ceFAZolin  (ANCEF ) IVPB 2g/100 mL premix        2 g 200 mL/hr over 30 Minutes Intravenous Every 8 hours 11/07/23 2208 11/08/23 0938   11/07/23 1616  ceFAZolin  (ANCEF ) 2-4 GM/100ML-% IVPB       Note to Pharmacy: [name] E: cabinet override      11/07/23 1616 11/07/23 1807   11/07/23 1615  ceFAZolin  (ANCEF ) IVPB 2g/100 mL premix        2 g 200 mL/hr over 30 Minutes Intravenous  Once 11/07/23 1611 11/07/23 1754       Subjective: Seen and examined at bedside and was a little short of breath.  Denies any current pain.  No nausea or vomiting.  Denies any other concerns or complaints at this time just feels weak.  Objective: Vitals:   11/09/23 0456 11/09/23 0855 11/09/23 1240 11/09/23 1439  BP: (!) 150/51 (!) 147/56 (!) 120/42 (!) 130/46  Pulse:  79 76 65 69  Resp: 18 16  17   Temp: 98 F (36.7 C) 98 F (36.7 C) 98.6 F (37 C) 98.4 F (36.9 C)  TempSrc: Oral Oral Oral Oral  SpO2: 96% 99% 97% 100%  Weight:      Height:        Intake/Output Summary (Last 24 hours) at 11/09/2023 1823 Last data filed at 11/09/2023 1700 Gross per 24 hour  Intake 1259.53 ml  Output 1200 ml  Net 59.53 ml   Filed Weights   11/06/23 1559 11/07/23 1547  Weight: 88.5 kg (P) 90.7 kg   Examination: Physical Exam:  Constitutional: Obese elderly Caucasian female who is in no acute distress but is a little short of breath Respiratory: Diminished to auscultation bilaterally with coarse breath sounds and has some crackles but no appreciable no wheezing, rales, rhonchi. Normal respiratory effort and patient is not tachypenic. No accessory muscle  use.  Wearing supplemental oxygen via nasal cannula more Cardiovascular: RRR, no murmurs / rubs / gallops. S1 and S2 auscultated.  1+ lower extremity edema Abdomen: Soft, non-tender, distended secondary to body habitus. Bowel sounds positive.  GU: Deferred. Musculoskeletal: No clubbing / cyanosis of digits/nails. No joint deformity upper and lower extremities. Skin: No rashes, lesions, ulcers on limited skin evaluation. No induration; Warm and dry.  Neurologic: CN 2-12 grossly intact with no focal deficits. Romberg sign and cerebellar reflexes not assessed.  Psychiatric: Normal judgment and insight. Awake and alert  Data Reviewed: I have personally reviewed following labs and imaging studies  CBC: Recent Labs  Lab 11/06/23 1652 11/07/23 0645 11/08/23 0656 11/09/23 0617  WBC 7.2 8.2 11.3* 13.3*  NEUTROABS 5.2  --  8.9* 8.8*  HGB 14.7 13.1 11.2* 8.6*  HCT 44.1 39.6 34.6* 26.8*  MCV 100.7* 100.8* 102.7* 102.7*  PLT 127* 116* 138* 130*   Basic Metabolic Panel: Recent Labs  Lab 11/06/23 1652 11/07/23 0645 11/08/23 0656 11/09/23 0617  NA 138 138 138 138  K 3.9 3.6 4.5 4.2  CL 99 105 106 102  CO2 26 25 23 27   GLUCOSE 124* 111* 148* 121*  BUN 18 19 26* 31*  CREATININE 0.93 0.77 1.16* 0.84  CALCIUM  9.6 8.9 8.6* 8.1*  MG  --   --  2.1 2.1  PHOS  --   --  4.2 1.9*   GFR: Estimated Creatinine Clearance: 43.2 mL/min (by C-G formula based on SCr of 0.84 mg/dL). Liver Function Tests: Recent Labs  Lab 11/07/23 0645 11/08/23 0656 11/09/23 0617  AST 27 27 23   ALT 19 18 13   ALKPHOS 33* 31* 25*  BILITOT 1.1 0.8 0.7  PROT 6.4* 6.1* 5.5*  ALBUMIN 3.4* 3.1* 2.8*   No results for input(s): "LIPASE", "AMYLASE" in the last 168 hours. No results for input(s): "AMMONIA" in the last 168 hours. Coagulation Profile: Recent Labs  Lab 11/06/23 1652  INR 1.2   Cardiac Enzymes: No results for input(s): "CKTOTAL", "CKMB", "CKMBINDEX", "TROPONINI" in the last 168 hours. BNP (last 3  results) No results for input(s): "PROBNP" in the last 8760 hours. HbA1C: Recent Labs    11/08/23 0656  HGBA1C 4.8   CBG: No results for input(s): "GLUCAP" in the last 168 hours. Lipid Profile: No results for input(s): "CHOL", "HDL", "LDLCALC", "TRIG", "CHOLHDL", "LDLDIRECT" in the last 72 hours. Thyroid  Function Tests: Recent Labs    11/07/23 0645  TSH 4.206   Anemia Panel: Recent Labs    11/07/23 0645  VITAMINB12 312   Sepsis Labs: No  results for input(s): "PROCALCITON", "LATICACIDVEN" in the last 168 hours.  Recent Results (from the past 240 hours)  Surgical pcr screen     Status: Abnormal   Collection Time: 11/07/23  1:53 PM   Specimen: Nasal Mucosa; Nasal Swab  Result Value Ref Range Status   MRSA, PCR DETECTED (A) NEGATIVE Final    Comment: RESULT CALLED TO, READ BACK BY AND VERIFIED WITH: RN TStanton Earthly [phone] @1642  FH    Staphylococcus aureus POSITIVE (A) NEGATIVE Final    Comment: (NOTE) The Xpert SA Assay (FDA approved for NASAL specimens in patients 2 years of age and older), is one component of a comprehensive surveillance program. It is not intended to diagnose infection nor to guide or monitor treatment. Performed at [facility], [address] [place], [place] [address]     Radiology Studies: DG CHEST PORT 1 VIEW Result Date: 11/09/2023 CLINICAL DATA:  [id number] SOB (shortness of breath) [id number] EXAM: PORTABLE CHEST - 1 VIEW COMPARISON:  11/08/2023. FINDINGS: Cardiac silhouette is prominent. There is pulmonary interstitial prominence with vascular congestion. No focal consolidation. No pneumothorax or pleural effusion identified. Aorta is calcified. There is a left-sided pacer. IMPRESSION: Findings suggest CHF. Electronically Signed   By: Sydell Eva M.D.   On: 11/09/2023 08:38   DG CHEST PORT 1 VIEW Result Date: 11/08/2023 CLINICAL DATA:  Shortness of breath EXAM: PORTABLE CHEST 1 VIEW COMPARISON:  Chest x-Hewett 11/06/2023 FINDINGS: Enlarged  cardiopericardial silhouette. Calcified aorta. Prominent central vascular congestion. Left upper chest pacemaker leads along the right side of the heart. Subtle opacity at the lung bases. Atelectasis is favored. Recommend follow-up. No pneumothorax. No effusion or consolidation. Films are under penetrated. IMPRESSION: Enlarged cardiopericardial silhouette with vascular congestion and pacemaker. Subtle opacity at the bases, favor atelectasis. Recommend follow-up. Electronically Signed   By: Adrianna Horde M.D.   On: 11/08/2023 14:59   DG FEMUR, MIN 2 VIEWS RIGHT Result Date: 11/07/2023 CLINICAL DATA:  Elective surgery. EXAM: RIGHT FEMUR 2 VIEWS COMPARISON:  Preoperative imaging FINDINGS: Nine fluoroscopic spot views of the right femur submitted from the operating room. Femoral intramedullary nail with trans trochanteric and distal locking screw fixation of proximal femur fracture. Fluoroscopy time 176.7 seconds. Dose 71.17 mGy. IMPRESSION: Intraoperative fluoroscopy during proximal femur fracture fixation. Electronically Signed   By: Chadwick Colonel M.D.   On: 11/07/2023 20:22   DG C-Arm 1-60 Min-No Report Result Date: 11/07/2023 Fluoroscopy was utilized by the requesting physician.  No radiographic interpretation.   DG C-Arm 1-60 Min-No Report Result Date: 11/07/2023 Fluoroscopy was utilized by the requesting physician.  No radiographic interpretation.   Scheduled Meds:  acetaminophen   1,000 mg Oral TID   amiodarone   200 mg Oral QHS   apixaban   5 mg Oral BID   brimonidine   1 drop Right Eye Q12H   calcium -vitamin D   1 tablet Oral Q breakfast   Chlorhexidine  Gluconate Cloth  6 each Topical Daily   dextromethorphan -guaiFENesin   1 tablet Oral BID   furosemide   20 mg Intravenous Once   levothyroxine   100 mcg Oral Q0600   lidocaine   1 patch Transdermal Q24H   memantine   10 mg Oral BID   multivitamin with minerals  1 tablet Oral Daily   mupirocin  ointment  1 Application Nasal BID   phosphorus  500  mg Oral BID   polyethylene glycol  17 g Oral BID   polyvinyl alcohol   1 drop Right Eye TID   potassium chloride   10 mEq Oral Daily  senna-docusate  1 tablet Oral BID   timolol   1 drop Right Eye Q12H   Continuous Infusions:   LOS: 3 days   Aura Leeds, DO [facility] Available via Epic secure chat 7am-7pm After these hours, please refer to coverage provider listed on [email or web address] 11/09/2023, 6:23 PM

## 2023-11-10 ENCOUNTER — Inpatient Hospital Stay (HOSPITAL_COMMUNITY)

## 2023-11-10 DIAGNOSIS — S72141A Displaced intertrochanteric fracture of right femur, initial encounter for closed fracture: Secondary | ICD-10-CM | POA: Diagnosis not present

## 2023-11-10 DIAGNOSIS — I480 Paroxysmal atrial fibrillation: Secondary | ICD-10-CM | POA: Diagnosis not present

## 2023-11-10 DIAGNOSIS — R5383 Other fatigue: Secondary | ICD-10-CM

## 2023-11-10 DIAGNOSIS — N179 Acute kidney failure, unspecified: Secondary | ICD-10-CM | POA: Diagnosis not present

## 2023-11-10 DIAGNOSIS — D696 Thrombocytopenia, unspecified: Secondary | ICD-10-CM | POA: Diagnosis not present

## 2023-11-10 LAB — CBC WITH DIFFERENTIAL/PLATELET
Abs Immature Granulocytes: 0.05 10*3/uL (ref 0.00–0.07)
Basophils Absolute: 0.1 10*3/uL (ref 0.0–0.1)
Basophils Relative: 1 %
Eosinophils Absolute: 0.4 10*3/uL (ref 0.0–0.5)
Eosinophils Relative: 5 %
HCT: 24.2 % — ABNORMAL LOW (ref 36.0–46.0)
Hemoglobin: 7.9 g/dL — ABNORMAL LOW (ref 12.0–15.0)
Immature Granulocytes: 1 %
Lymphocytes Relative: 16 %
Lymphs Abs: 1.3 10*3/uL (ref 0.7–4.0)
MCH: 33.8 pg (ref 26.0–34.0)
MCHC: 32.6 g/dL (ref 30.0–36.0)
MCV: 103.4 fL — ABNORMAL HIGH (ref 80.0–100.0)
Monocytes Absolute: 1 10*3/uL (ref 0.1–1.0)
Monocytes Relative: 13 %
Neutro Abs: 5.1 10*3/uL (ref 1.7–7.7)
Neutrophils Relative %: 64 %
Platelets: 108 10*3/uL — ABNORMAL LOW (ref 150–400)
RBC: 2.34 MIL/uL — ABNORMAL LOW (ref 3.87–5.11)
RDW: 13.3 % (ref 11.5–15.5)
WBC: 7.8 10*3/uL (ref 4.0–10.5)
nRBC: 0.4 % — ABNORMAL HIGH (ref 0.0–0.2)

## 2023-11-10 LAB — COMPREHENSIVE METABOLIC PANEL WITH GFR
ALT: 16 U/L (ref 0–44)
ALT: 17 U/L (ref 0–44)
AST: 28 U/L (ref 15–41)
AST: 31 U/L (ref 15–41)
Albumin: 2.7 g/dL — ABNORMAL LOW (ref 3.5–5.0)
Albumin: 2.7 g/dL — ABNORMAL LOW (ref 3.5–5.0)
Alkaline Phosphatase: 31 U/L — ABNORMAL LOW (ref 38–126)
Alkaline Phosphatase: 39 U/L (ref 38–126)
Anion gap: 6 (ref 5–15)
Anion gap: 12 (ref 5–15)
BUN: 29 mg/dL — ABNORMAL HIGH (ref 8–23)
BUN: 26 mg/dL — ABNORMAL HIGH (ref 8–23)
CO2: 29 mmol/L (ref 22–32)
CO2: 30 mmol/L (ref 22–32)
Calcium: 8 mg/dL — ABNORMAL LOW (ref 8.9–10.3)
Calcium: 8.4 mg/dL — ABNORMAL LOW (ref 8.9–10.3)
Chloride: 105 mmol/L (ref 98–111)
Chloride: 99 mmol/L (ref 98–111)
Creatinine, Ser: 0.71 mg/dL (ref 0.44–1.00)
Creatinine, Ser: 0.84 mg/dL (ref 0.44–1.00)
GFR, Estimated: >60 mL/min (ref >60)
GFR, Estimated: >60 mL/min (ref >60)
Glucose, Bld: 119 mg/dL — ABNORMAL HIGH (ref 70–99)
Glucose, Bld: 123 mg/dL — ABNORMAL HIGH (ref 70–99)
Potassium: 3.6 mmol/L (ref 3.5–5.1)
Potassium: 3.5 mmol/L (ref 3.5–5.1)
Sodium: 140 mmol/L (ref 135–145)
Sodium: 141 mmol/L (ref 135–145)
Total Bilirubin: 1.1 mg/dL (ref 0.0–1.2)
Total Bilirubin: 0.8 mg/dL (ref 0.0–1.2)
Total Protein: 5.3 g/dL — ABNORMAL LOW (ref 6.5–8.1)
Total Protein: 5.5 g/dL — ABNORMAL LOW (ref 6.5–8.1)

## 2023-11-10 LAB — IRON AND TIBC
Iron: 28 ug/dL (ref 28–170)
Saturation Ratios: 9 % — ABNORMAL LOW (ref 10.4–31.8)
TIBC: 330 ug/dL (ref 250–450)
UIBC: 302 ug/dL

## 2023-11-10 LAB — FOLATE: Folate: 24.2 ng/mL (ref >5.9)

## 2023-11-10 LAB — PHOSPHORUS
Phosphorus: 2.2 mg/dL — ABNORMAL LOW (ref 2.5–4.6)
Phosphorus: 2 mg/dL — ABNORMAL LOW (ref 2.5–4.6)

## 2023-11-10 LAB — HEMOGLOBIN AND HEMATOCRIT, BLOOD
HCT: 25.1 % — ABNORMAL LOW (ref 36.0–46.0)
Hemoglobin: 8.2 g/dL — ABNORMAL LOW (ref 12.0–15.0)

## 2023-11-10 LAB — RETICULOCYTES
Immature Retic Fract: 36.7 % — ABNORMAL HIGH (ref 2.3–15.9)
RBC.: 2.31 MIL/uL — ABNORMAL LOW (ref 3.87–5.11)
Retic Count, Absolute: 93.8 10*3/uL (ref 19.0–186.0)
Retic Ct Pct: 4.1 % — ABNORMAL HIGH (ref 0.4–3.1)

## 2023-11-10 LAB — MAGNESIUM
Magnesium: 2.1 mg/dL (ref 1.7–2.4)
Magnesium: 2.1 mg/dL (ref 1.7–2.4)

## 2023-11-10 LAB — VITAMIN B12: Vitamin B-12: 340 pg/mL (ref 180–914)

## 2023-11-10 LAB — FERRITIN: Ferritin: 68 ng/mL (ref 11–307)

## 2023-11-10 MED ORDER — BISACODYL 10 MG RE SUPP
10.0000 mg | Freq: Once | RECTAL | Status: AC
  Administered 2023-11-10: 10 mg via RECTAL
  Filled 2023-11-10: qty 1

## 2023-11-10 MED ORDER — AMIODARONE HCL IN DEXTROSE 360-4.14 MG/200ML-% IV SOLN
30.0000 mg/h | INTRAVENOUS | Status: DC
  Administered 2023-11-11: 30 mg/h via INTRAVENOUS
  Filled 2023-11-10 (×2): qty 200

## 2023-11-10 MED ORDER — METOPROLOL TARTRATE 5 MG/5ML IV SOLN
2.5000 mg | Freq: Three times a day (TID) | INTRAVENOUS | Status: DC | PRN
  Administered 2023-11-10: 2.5 mg via INTRAVENOUS
  Filled 2023-11-10: qty 5

## 2023-11-10 MED ORDER — K PHOS MONO-SOD PHOS DI & MONO 155-852-130 MG PO TABS
500.0000 mg | ORAL_TABLET | Freq: Two times a day (BID) | ORAL | Status: AC
  Administered 2023-11-10 (×2): 500 mg via ORAL
  Filled 2023-11-10 (×2): qty 2

## 2023-11-10 MED ORDER — AMIODARONE LOAD VIA INFUSION
150.0000 mg | Freq: Once | INTRAVENOUS | Status: AC
  Administered 2023-11-10: 150 mg via INTRAVENOUS
  Filled 2023-11-10: qty 83.34

## 2023-11-10 MED ORDER — FUROSEMIDE 20 MG PO TABS: 20.0000 mg | ORAL_TABLET | Freq: Every day | ORAL | Status: DC

## 2023-11-10 MED ORDER — FUROSEMIDE 20 MG PO TABS
20.0000 mg | ORAL_TABLET | Freq: Every day | ORAL | Status: DC
  Administered 2023-11-10 – 2023-11-12 (×3): 20 mg via ORAL
  Filled 2023-11-10 (×3): qty 1

## 2023-11-10 MED ORDER — AMIODARONE HCL IN DEXTROSE 360-4.14 MG/200ML-% IV SOLN
60.0000 mg/h | INTRAVENOUS | Status: DC
  Administered 2023-11-10: 60 mg/h via INTRAVENOUS
  Filled 2023-11-10: qty 200

## 2023-11-10 NOTE — Progress Notes (Signed)
 Pt noted to be in A. Fib. RN obtained V/S and 12 lead EKG. Sheikh, DO informed of cardiac rhythm and above mentioned interventions. Pt denies symptoms of SOB, chest pain, & lightheadedness.

## 2023-11-10 NOTE — Progress Notes (Addendum)
   11/10/23 1718  Spiritual Encounters  Type of Visit Initial  Care provided to: Pt and family  Referral source Other (comment) (rapid response page)  Reason for visit Urgent spiritual support  OnCall Visit Yes   Chaplain responded to Rapid Response Page and provided spiritual care to Pt and her daughter and granddaughter. Family has deep faith and were coping very well. No spiritual need at this time.  Chaplain Alphonzo Ask

## 2023-11-10 NOTE — Plan of Care (Signed)

## 2023-11-10 NOTE — Progress Notes (Signed)
 PROGRESS NOTE    [name]  ONG:[id number] DOB: 05-Mar-1928 DOA: 11/06/2023 PCP: Tye Gall, MD   Brief Narrative:  Patient is a 89 year old elderly Caucasian female with a past medical history significant for A-fib on anticoagulation, chronic diastolic CHF, history of aortic stenosis, history of tachybradycardia syndrome and sick sinus dysfunction status post pacemaker, GERD, Sendil dementia, essential hypertension, hypothyroidism as well as other comorbid's who presented from her ALF after a fall.  Patient was ambulating and leaving her bathroom and she tripped on the edge of the rug and fell on her right side.  Further workup was done including a trauma scan and CT pelvis showed an acute comminuted and markedly displaced right intratrochanteric femur fracture.  Orthopedic surgery was consulted and her anticoagulation was held and plan is for surgical intervention with intramedullary nail of the femur. She is POD 3 and now position has been complicated by an AKI, acute blood loss anemia as well acute respiratory failure with hypoxia and acute on chronic diastolic CHF.  She has now been initiated on diuresis and IV fluid hydration has now been discontinued given that her AKI is improved.  Blood count continues to drop slowly and now she went in A-fib with RVR.  She was given IV metoprolol  but heart rate continued to be sustained so we will transfer to the cardiac unit and placed on a amiodarone  drip and bolus for now.  Assessment and Plan:  Mechanical fall with resultant Right Femur Fracture: Elderly patient presented from ALF after sustaining right hip pain from a mechanical fall. Hip imaging shows closed displaced intertrochanteric fracture of the right femur. Orthopedic surgery consulted, plan for intramedullary nailing of the femur 11/07/23 at 1530. C/w Multimodal pain control with scheduled Acetaminophen  1000 mg po q8h, Lidocaine  Patch 1 Patch TD q24h as well as  Hydrocodone -Acetaminophen  5-325 1 tab po q6hprn Moderate Pain and given IV Fentanyl  50 mcg q30m minprn Severe Pain x3 doses -Follow-up vitamin D , 25-Hydroxy was 54.75 -PT/OT recommending SNF -Fall precautions -Bowel Regimen w/ Senna-Docusate 1 tab po qHSprn Mild Consitpation -WBAT per Orthopedic Surgery as well as Resumption of Apixaban ; D/w Dr. Hermina Loosen and ok to resume Apixaban  5 mg po BID  Acute Respiratory Failure w/ Hypoxia in the setting of Acute on Chronic Diastolic CHF: Had low saturations this AM. Will start Flutter Valve and Incentive. CXR done yesterday and showed Enlarged cardiopericardial silhouette with vascular congestion and pacemaker. Also there was Subtle opacity at the bases favoring atelectasis.  Will add as needed albuterol  nebs and repeat chest x-Dion this AM as below.  She has been on and off oxygen with saturations ranging from 87 to low 90s.  Encourage incentive spirometry and flutter valve and then reassess. Start Diuresis as below. SpO2: 98 % O2 Flow Rate (L/min): 2 L/min   Paroxysmal A-fib now with RVR/  Sinus node dysfunction s/p PPM: HR was stable in the 60s but she went into A-fib with RVR.  Given a dose of IV metoprolol  without much effect.  Will discontinue her home Amiodarone  200 mg po at bedtime and place her on a Amio drip and bolus for now and transferred to the progressive care unit for further evaluation and monitoring; Apixaban  resumed   Acute on Chronic Diastolic Heart Failure Aortic Stenosis -Recent TTE from 09/14/2023 showed hyperdynamic EF >75%, severe LVH, G2DD, moderately dilated LA and moderate aortic stenosis. -Repeat CXR shows findings suggestive of CHF with prominent cardiac silhouette and pulmonary interstitial prominence with vascular congestion -Initially held  diuretics on Admission and held again due yo AKI but will give a dose of IV Furosemide  20 mg x1 and will repeat this evening  -Strict I's and O's and Daily Weights; Patient is  +1,090.7 -Continue potassium supplementation w/ po KCL 10 mEQ Daily  -Repeat CXR in the AM  Leukocytosis: Likely reactive in the setting of Surgery. WBC trending up and went from 8.2 -> 11.3 -> 13.3 and is now 7.8. CTM for S/Sx of Infection. Repeat CBC in the AM   Hypophosphatemia: Phos Level is now 1.9. Replete with po K Phos Neutral 500 mg po BID x2. CTM and Replete as Necessary. Repeat Phos Level in the AM  Glaucoma: C/w Brimonidine  0.2% Opthalmic Solution in Right Eye q12h, Timolol  0.5% Ophthalmic solution 1 drop Right Eye q12h, and Polyvinal Alcohol  1.4% Ophthalmic 1 Drop in R Eye TID   Hypothyroidism: TSH was 4.206. C/w Levothyroxine  100 mcg po Daily    Dementia: Alert and oriented x 3 but has poor memory. C/w Memantine  10 mg po BID. Checked Vitamin B12 and was 312  Constipation: Change PRN Senna-Docusate to scheduled 1 tab po BID; Also add Miralax  17 gram BID and Bisacodyl 10 mg RC PRN; will give her a one-time dose of bisacodyl 10 mg suppository  AKI: BUN/Cr Trend: improved  Recent Labs  Lab 11/06/23 1652 11/07/23 0645 11/08/23 0656 11/09/23 0617 11/10/23 0605  BUN 18 19 26* 31* 29*  CREATININE 0.93 0.77 1.16* 0.84 0.71  -Check U/A, Urine Osm, Urine Cr, Urine Na+; IVF now stopped and will reinitiate IV Lasix  for short-term and then changed back to her oral Lasix  dosing -Avoid Nephrotoxic Medications, Contrast Dyes, Hypotension and Dehydration to Ensure Adequate Renal Perfusion and will need to Renally Adjust Meds -Continue to Monitor and Trend Renal Function carefully and repeat CMP in the AM   Macrocytic Anemia/ Acute Blood Loss Anemia in the setting of Post-Operative Drop: Hgb/Hct Trend went from 14.7/44.1 -> 13.1/39.6 -> 11.2/34.6 -> 8.6/26.8 -> 7.9/24.2 -> 8.2/25.1. Checked Anemia Panel and showed an iron level of 28, UIBC 302, TIBC 330, saturation ration of 9%, ferritin of 68, folate of 24.2 and vitamin B12 340. CTM for S/Sx of bleeding; No overt bleeding noted now but had  some bleeding yesterday after surgery.  Repeat CBC in the a.m.  Thrombocytopenia: Plt Count went from 127 -> 116 -> 138 -> 130. CTM for S/Sx of Bleeding; No overt bleeding noted.   Hypoalbuminemia: Patient's Albumin Level went from 3.1 -> 2.8 -> 2.7. CTM and Trend and repeat CMP in the AM  Class I Obesity: Complicates overall prognosis and care. Estimated body mass index is 35.43 kg/m (pended) as calculated from the following:   Height as of this encounter: 5\' 3"  (1.6 m).   Weight as of this encounter: (P) 90.7 kg. Weight Loss and Dietary Counseling given   DVT prophylaxis: SCDs Start: 11/06/23 1958 apixaban  (ELIQUIS ) tablet 5 mg    Code Status: Limited: Do not attempt resuscitation (DNR) -DNR-LIMITED -Do Not Intubate/DNI  Family Communication: D/w family present @ Bedside  Disposition Plan:  Level of care: Telemetry Cardiac Status is: Inpatient Remains inpatient appropriate because: His further clinical improvement in her heart rate, stabilization of her blood pressure and improvement in respiratory status prior to discharge into SNF   Consultants:  Orthopedic Surgery  Procedures:  As Delineated as above  Antimicrobials:  Anti-infectives (From admission, onward)    Start     Dose/Rate Route Frequency Ordered Stop   11/08/23 0200  ceFAZolin  (ANCEF ) IVPB 2g/100 mL premix        2 g 200 mL/hr over 30 Minutes Intravenous Every 8 hours 11/07/23 2208 11/08/23 0938   11/07/23 1616  ceFAZolin  (ANCEF ) 2-4 GM/100ML-% IVPB       Note to Pharmacy: [name] E: cabinet override      11/07/23 1616 11/07/23 1807   11/07/23 1615  ceFAZolin  (ANCEF ) IVPB 2g/100 mL premix        2 g 200 mL/hr over 30 Minutes Intravenous  Once 11/07/23 1611 11/07/23 1754       Subjective: Seen and examined at bedside and was fatigued today.  Denied any nausea or vomiting.  No chest pain or shortness of breath but heart rate became uncontrolled this afternoon.  Still has not had a bowel movement  yet.  Objective: Vitals:   11/10/23 1155 11/10/23 1532 11/10/23 1551 11/10/23 1654  BP: 117/68 (!) 104/52 (!) 119/46 111/68  Pulse: (!) 120 (!) 129  (!) 119  Resp: 12 18  19   Temp: 98 F (36.7 C) 97.9 F (36.6 C)    TempSrc: Oral Oral    SpO2: 98% 96%  98%  Weight:      Height:        Intake/Output Summary (Last 24 hours) at 11/10/2023 1912 Last data filed at 11/10/2023 1657 Gross per 24 hour  Intake 720 ml  Output 1300 ml  Net -580 ml   Filed Weights   11/06/23 1559 11/07/23 1547  Weight: 88.5 kg (P) 90.7 kg   Examination: Physical Exam:  Constitutional: Obese elderly Caucasian female who is in no acute distress but appears fatigued Respiratory: Diminished to auscultation bilaterally with some coarse breath sounds and does have some slight crackles but no appreciable wheezing or rales.  No rhonchi noted.  Wearing supplemental oxygen via nasal cannula Cardiovascular: RRR, no murmurs / rubs / gallops. S1 and S2 auscultated.  Mild lower extremity edema Abdomen: Soft, mildly-tender, distended secondary to body habitus. Bowel sounds positive.  GU: Deferred. Musculoskeletal: No clubbing / cyanosis of digits/nails. No joint deformity upper and lower extremities.  Skin: No rashes, lesions, ulcers on limited skin evaluation. No induration; Warm and dry.  Neurologic: CN 2-12 grossly intact with no focal deficits. Romberg sign cerebellar reflexes not assessed.  Psychiatric: She is awake and alert  Data Reviewed: I have personally reviewed following labs and imaging studies  CBC: Recent Labs  Lab 11/06/23 1652 11/07/23 0645 11/08/23 0656 11/09/23 0617 11/10/23 0605 11/10/23 1820  WBC 7.2 8.2 11.3* 13.3* 7.8  --   NEUTROABS 5.2  --  8.9* 8.8* 5.1  --   HGB 14.7 13.1 11.2* 8.6* 7.9* 8.2*  HCT 44.1 39.6 34.6* 26.8* 24.2* 25.1*  MCV 100.7* 100.8* 102.7* 102.7* 103.4*  --   PLT 127* 116* 138* 130* 108*  --    Basic Metabolic Panel: Recent Labs  Lab 11/06/23 1652  11/07/23 0645 11/08/23 0656 11/09/23 0617 11/10/23 0605  NA 138 138 138 138 140  K 3.9 3.6 4.5 4.2 3.6  CL 99 105 106 102 105  CO2 26 25 23 27 29   GLUCOSE 124* 111* 148* 121* 119*  BUN 18 19 26* 31* 29*  CREATININE 0.93 0.77 1.16* 0.84 0.71  CALCIUM  9.6 8.9 8.6* 8.1* 8.0*  MG  --   --  2.1 2.1 2.1  PHOS  --   --  4.2 1.9* 2.2*   GFR: Estimated Creatinine Clearance: 45.3 mL/min (by C-G formula based on SCr of 0.71  mg/dL). Liver Function Tests: Recent Labs  Lab 11/07/23 0645 11/08/23 0656 11/09/23 0617 11/10/23 0605  AST 27 27 23 28   ALT 19 18 13 16   ALKPHOS 33* 31* 25* 31*  BILITOT 1.1 0.8 0.7 1.1  PROT 6.4* 6.1* 5.5* 5.3*  ALBUMIN 3.4* 3.1* 2.8* 2.7*   No results for input(s): "LIPASE", "AMYLASE" in the last 168 hours. No results for input(s): "AMMONIA" in the last 168 hours. Coagulation Profile: Recent Labs  Lab 11/06/23 1652  INR 1.2   Cardiac Enzymes: No results for input(s): "CKTOTAL", "CKMB", "CKMBINDEX", "TROPONINI" in the last 168 hours. BNP (last 3 results) No results for input(s): "PROBNP" in the last 8760 hours. HbA1C: Recent Labs    11/08/23 0656  HGBA1C 4.8   CBG: No results for input(s): "GLUCAP" in the last 168 hours. Lipid Profile: No results for input(s): "CHOL", "HDL", "LDLCALC", "TRIG", "CHOLHDL", "LDLDIRECT" in the last 72 hours. Thyroid  Function Tests: No results for input(s): "TSH", "T4TOTAL", "FREET4", "T3FREE", "THYROIDAB" in the last 72 hours. Anemia Panel: Recent Labs    11/10/23 0605  VITAMINB12 340  FOLATE 24.2  FERRITIN 68  TIBC 330  IRON 28  RETICCTPCT 4.1*   Sepsis Labs: No results for input(s): "PROCALCITON", "LATICACIDVEN" in the last 168 hours.  Recent Results (from the past 240 hours)  Surgical pcr screen     Status: Abnormal   Collection Time: 11/07/23  1:53 PM   Specimen: Nasal Mucosa; Nasal Swab  Result Value Ref Range Status   MRSA, PCR DETECTED (A) NEGATIVE Final    Comment: RESULT CALLED TO, READ BACK  BY AND VERIFIED WITH: RN TStanton Earthly [phone] @1642  FH    Staphylococcus aureus POSITIVE (A) NEGATIVE Final    Comment: (NOTE) The Xpert SA Assay (FDA approved for NASAL specimens in patients 6 years of age and older), is one component of a comprehensive surveillance program. It is not intended to diagnose infection nor to guide or monitor treatment. Performed at [facility], [address] [place], [place] [address]     Radiology Studies: DG CHEST PORT 1 VIEW Result Date: 11/10/2023 CLINICAL DATA:  Shortness of breath. EXAM: PORTABLE CHEST 1 VIEW COMPARISON:  11/09/2023 FINDINGS: The lungs are clear without focal pneumonia, edema, pneumothorax or pleural effusion. Cardiopericardial silhouette is at upper limits of normal for size. Degenerative changes noted in both shoulders. Left-sided permanent pacemaker again noted. Telemetry leads overlie the chest. IMPRESSION: No active disease. No features of pulmonary edema on the current study. Electronically Signed   By: Donnal Fusi M.D.   On: 11/10/2023 09:15   DG CHEST PORT 1 VIEW Result Date: 11/09/2023 CLINICAL DATA:  [id number] SOB (shortness of breath) [id number] EXAM: PORTABLE CHEST - 1 VIEW COMPARISON:  11/08/2023. FINDINGS: Cardiac silhouette is prominent. There is pulmonary interstitial prominence with vascular congestion. No focal consolidation. No pneumothorax or pleural effusion identified. Aorta is calcified. There is a left-sided pacer. IMPRESSION: Findings suggest CHF. Electronically Signed   By: Sydell Eva M.D.   On: 11/09/2023 08:38   Scheduled Meds:  acetaminophen   1,000 mg Oral TID   amiodarone   150 mg Intravenous Once   apixaban   5 mg Oral BID   brimonidine   1 drop Right Eye Q12H   calcium -vitamin D   1 tablet Oral Q breakfast   Chlorhexidine  Gluconate Cloth  6 each Topical Daily   dextromethorphan -guaiFENesin   1 tablet Oral BID   furosemide   20 mg Oral QAC breakfast   levothyroxine   100 mcg Oral Q0600  lidocaine   1  patch Transdermal Q24H   memantine   10 mg Oral BID   multivitamin with minerals  1 tablet Oral Daily   mupirocin  ointment  1 Application Nasal BID   phosphorus  500 mg Oral BID   polyethylene glycol  17 g Oral BID   polyvinyl alcohol   1 drop Right Eye TID   potassium chloride   10 mEq Oral Daily   senna-docusate  1 tablet Oral BID   timolol   1 drop Right Eye Q12H   Continuous Infusions:  amiodarone      Followed by   Cecily Cohen ON 11/11/2023] amiodarone       LOS: 4 days   Aura Leeds, DO [facility] Available via Epic secure chat 7am-7pm After these hours, please refer to coverage provider listed on [email or web address] 11/10/2023, 7:12 PM

## 2023-11-11 ENCOUNTER — Inpatient Hospital Stay (HOSPITAL_COMMUNITY)

## 2023-11-11 DIAGNOSIS — D696 Thrombocytopenia, unspecified: Secondary | ICD-10-CM | POA: Diagnosis not present

## 2023-11-11 DIAGNOSIS — N179 Acute kidney failure, unspecified: Secondary | ICD-10-CM | POA: Diagnosis not present

## 2023-11-11 DIAGNOSIS — S72141A Displaced intertrochanteric fracture of right femur, initial encounter for closed fracture: Secondary | ICD-10-CM | POA: Diagnosis not present

## 2023-11-11 DIAGNOSIS — I480 Paroxysmal atrial fibrillation: Secondary | ICD-10-CM | POA: Diagnosis not present

## 2023-11-11 LAB — CBC WITH DIFFERENTIAL/PLATELET
Abs Immature Granulocytes: 0.07 10*3/uL (ref 0.00–0.07)
Basophils Absolute: 0.1 10*3/uL (ref 0.0–0.1)
Basophils Relative: 1 %
Eosinophils Absolute: 0.4 10*3/uL (ref 0.0–0.5)
Eosinophils Relative: 6 %
HCT: 24.7 % — ABNORMAL LOW (ref 36.0–46.0)
Hemoglobin: 7.9 g/dL — ABNORMAL LOW (ref 12.0–15.0)
Immature Granulocytes: 1 %
Lymphocytes Relative: 20 %
Lymphs Abs: 1.4 10*3/uL (ref 0.7–4.0)
MCH: 33.8 pg (ref 26.0–34.0)
MCHC: 32 g/dL (ref 30.0–36.0)
MCV: 105.6 fL — ABNORMAL HIGH (ref 80.0–100.0)
Monocytes Absolute: 0.9 10*3/uL (ref 0.1–1.0)
Monocytes Relative: 13 %
Neutro Abs: 4.1 10*3/uL (ref 1.7–7.7)
Neutrophils Relative %: 59 %
Platelets: 126 10*3/uL — ABNORMAL LOW (ref 150–400)
RBC: 2.34 MIL/uL — ABNORMAL LOW (ref 3.87–5.11)
RDW: 13.2 % (ref 11.5–15.5)
WBC: 6.9 10*3/uL (ref 4.0–10.5)
nRBC: 0.7 % — ABNORMAL HIGH (ref 0.0–0.2)

## 2023-11-11 LAB — COMPREHENSIVE METABOLIC PANEL WITH GFR
ALT: 16 U/L (ref 0–44)
AST: 30 U/L (ref 15–41)
Albumin: 2.6 g/dL — ABNORMAL LOW (ref 3.5–5.0)
Alkaline Phosphatase: 36 U/L — ABNORMAL LOW (ref 38–126)
Anion gap: 7 (ref 5–15)
BUN: 27 mg/dL — ABNORMAL HIGH (ref 8–23)
CO2: 31 mmol/L (ref 22–32)
Calcium: 8 mg/dL — ABNORMAL LOW (ref 8.9–10.3)
Chloride: 101 mmol/L (ref 98–111)
Creatinine, Ser: 0.68 mg/dL (ref 0.44–1.00)
GFR, Estimated: >60 mL/min (ref >60)
Glucose, Bld: 121 mg/dL — ABNORMAL HIGH (ref 70–99)
Potassium: 3.4 mmol/L — ABNORMAL LOW (ref 3.5–5.1)
Sodium: 139 mmol/L (ref 135–145)
Total Bilirubin: 1.3 mg/dL — ABNORMAL HIGH (ref 0.0–1.2)
Total Protein: 5.1 g/dL — ABNORMAL LOW (ref 6.5–8.1)

## 2023-11-11 LAB — MAGNESIUM: Magnesium: 2.1 mg/dL (ref 1.7–2.4)

## 2023-11-11 LAB — PHOSPHORUS: Phosphorus: 2.6 mg/dL (ref 2.5–4.6)

## 2023-11-11 MED ORDER — POTASSIUM CHLORIDE CRYS ER 20 MEQ PO TBCR
40.0000 meq | EXTENDED_RELEASE_TABLET | Freq: Two times a day (BID) | ORAL | Status: AC
  Administered 2023-11-11 (×2): 40 meq via ORAL
  Filled 2023-11-11 (×2): qty 2

## 2023-11-11 MED ORDER — AMIODARONE HCL 200 MG PO TABS
200.0000 mg | ORAL_TABLET | Freq: Two times a day (BID) | ORAL | Status: DC
  Administered 2023-11-11 – 2023-11-12 (×3): 200 mg via ORAL
  Filled 2023-11-11 (×3): qty 1

## 2023-11-11 NOTE — Progress Notes (Signed)
 PROGRESS NOTE    [name]  GEX:[id number] DOB: Nov 07, 1927 DOA: 11/06/2023 PCP: Tye Gall, MD   Brief Narrative:  Patient is a 89 year old elderly Caucasian female with a past medical history significant for A-fib on anticoagulation, chronic diastolic CHF, history of aortic stenosis, history of tachybradycardia syndrome and sick sinus dysfunction status post pacemaker, GERD, Sendil dementia, essential hypertension, hypothyroidism as well as other comorbid's who presented from her ALF after a fall.  Patient was ambulating and leaving her bathroom and she tripped on the edge of the rug and fell on her right side.  Further workup was done including a trauma scan and CT pelvis showed an acute comminuted and markedly displaced right intratrochanteric femur fracture.  Orthopedic surgery was consulted and her anticoagulation was held and plan is for surgical intervention with intramedullary nail of the femur. She is POD 4 and now position has been complicated by an AKI, acute blood loss anemia as well acute respiratory failure with hypoxia and acute on chronic diastolic CHF.  She has now been initiated on diuresis and IV fluid hydration has now been discontinued given that her AKI is improved.  Blood count continues to drop slowly and now she went in A-fib with RVR.  She was given IV Metoprolol  but heart rate continued to be sustained so we will transfer to the cardiac unit and placed on a amiodarone  drip and bolus for now.  Rates have improved and now will wean her back off of the amiodarone  drip and placed her on p.o. amiodarone  at a higher dose.  Hemoglobin remains relatively stable and will wean her off of supplemental oxygen via nasal.  Anticipate discharging to SNF next 24 to 48 hours  Assessment and Plan:  Mechanical fall with resultant Right Femur Fracture: Elderly patient presented from ALF after sustaining right hip pain from a mechanical fall. Hip imaging shows closed displaced  intertrochanteric fracture of the right femur. Orthopedic surgery consulted, plan for intramedullary nailing of the femur 11/07/23 at 1530. C/w Multimodal pain control with scheduled Acetaminophen  1000 mg po q8h, Lidocaine  Patch 1 Patch TD q24h as well as Hydrocodone -Acetaminophen  5-325 1 tab po q6hprn Moderate Pain and given IV Fentanyl  50 mcg q30m minprn Severe Pain x3 doses -Follow-up vitamin D , 25-Hydroxy was 54.75 -PT/OT recommending SNF -Fall precautions -Bowel Regimen w/ Senna-Docusate 1 tab po qHSprn Mild Consitpation -WBAT per Orthopedic Surgery as well as Resumption of Apixaban ; D/w Dr. Hermina Loosen and ok to resume Apixaban  5 mg po BID  Acute Respiratory Failure w/ Hypoxia in the setting of Acute on Chronic Diastolic CHF: Had low saturations this AM. Will start Flutter Valve and Incentive. CXR done yesterday and showed Enlarged cardiopericardial silhouette with vascular congestion and pacemaker. Also there was Subtle opacity at the bases favoring atelectasis. She has been on and off oxygen with saturations ranging from 87 to low 90s.  Encourage incentive spirometry and flutter valve and then reassess. Start Diuresis as below. SpO2: 92 % O2 Flow Rate (L/min): 3 L/min; Repeat CXR this AM showed "Opacity within the periphery of the left base compatible with atelectasis versus airspace disease." Unlikely PNA given that she is afebrile and has no Leukocytosis. Repeat CXR in the AM and wean off Supplemental O2 via [place].    Paroxysmal A-fib with RVR and now improved Sinus node dysfunction s/p PPM: HR was stable in the 60s but she went into A-fib with RVR 6/1/2.  Given a dose of IV metoprolol  without much effect. Discontinued her home Amiodarone  200  mg po at bedtime and place her on a Amio drip and bolus for now and transferred to the progressive care unit for further evaluation and monitoring; Apixaban  resumed. HR is improved and will wean off of Amiodarone  gtt and discussed with Cardiology Dr. Maximo Spar who  recommended transitioning to Amiodarone  200 mg po BID x7 days and then back to 200 mg po Daily    Acute on Chronic Diastolic Heart Failure, improving  Aortic Stenosis -Recent TTE from 09/14/2023 showed hyperdynamic EF >75%, severe LVH, G2DD, moderately dilated LA and moderate aortic stenosis. -Repeat CXR shows findings suggestive of CHF with prominent cardiac silhouette and pulmonary interstitial prominence with vascular congestion -Initially held diuretics on Admission and held again due to AKI and given IV Lasix  x2 and resume po Furosemide  20 mg po Daily -Strict I's and O's and Daily Weights; Patient is +2,210.8 -Continue potassium supplementation w/ po KCL 10 mEQ Daily  -Repeat CXR this AM showed "Opacity within the periphery of the left base compatible with atelectasis versus airspace disease. Aortic Atherosclerosis"  Leukocytosis: Likely reactive in the setting of Surgery. WBC trending up and went from 8.2 -> 11.3 -> 13.3 and is now 7.8 -> 6.9. CTM for S/Sx of Infection. Repeat CBC in the AM   Hypokalemia: K+ is now 3.4. Replete with po Kcl 40 mEQ BID x2 and po KCL 10 mEQ x1. CTM and Replete as Necessary. Repeat CMP in the AM  Hypophosphatemia: Phos Level is now 2.6. CTM and Replete as Necessary. Repeat Phos Level in the AM  Glaucoma: C/w Brimonidine  0.2% Opthalmic Solution in Right Eye q12h, Timolol  0.5% Ophthalmic solution 1 drop Right Eye q12h, and Polyvinal Alcohol  1.4% Ophthalmic 1 Drop in R Eye TID   Hypothyroidism: TSH was 4.206. C/w Levothyroxine  100 mcg po Daily    Dementia: Alert and oriented x 3 but has poor memory. C/w Memantine  10 mg po BID. Checked Vitamin B12 and was 312  Constipation: Change PRN Senna-Docusate to scheduled 1 tab po BID; Also add Miralax  17 gram BID and Bisacodyl 10 mg RC PRN; will give her a one-time dose of bisacodyl 10 mg suppository  AKI: BUN/Cr Trend: improved  Recent Labs  Lab 11/06/23 1652 11/07/23 0645 11/08/23 0656 11/09/23 0617  11/10/23 0605 11/10/23 1820 11/11/23 0505  BUN 18 19 26* 31* 29* 26* 27*  CREATININE 0.93 0.77 1.16* 0.84 0.71 0.84 0.68  -Check U/A, Urine Osm, Urine Cr, Urine Na+; IVF now stopped and will reinitiate IV Lasix  for short-term and then changed back to her oral Lasix  dosing -Avoid Nephrotoxic Medications, Contrast Dyes, Hypotension and Dehydration to Ensure Adequate Renal Perfusion and will need to Renally Adjust Meds -Continue to Monitor and Trend Renal Function carefully and repeat CMP in the AM   Macrocytic Anemia/ Acute Blood Loss Anemia in the setting of Post-Operative Drop: Hgb/Hct Trend went from 14.7/44.1 -> 13.1/39.6 -> 11.2/34.6 -> 8.6/26.8 -> 7.9/24.2 -> 8.2/25.1 -> 7.9/24.7 (MCV of 105.6). Checked Anemia Panel and showed an iron level of 28, UIBC 302, TIBC 330, saturation ration of 9%, ferritin of 68, folate of 24.2 and vitamin B12 340. CTM for S/Sx of bleeding; No overt bleeding noted now but had some bleeding yesterday after surgery.  Repeat CBC in the AM  Thrombocytopenia: Plt Count went from 127 -> 116 -> 138 -> 130 -> 126. CTM for S/Sx of Bleeding; No overt bleeding noted.   Hyperbilirubinemia: T Bili went from 0.8 -> 1.3. CTM and Trend and repeat CMP in the AM  Hypoalbuminemia: Patient's Albumin Level went from 3.1 -> 2.8 -> 2.7 -> 2.6. CTM and Trend and repeat CMP in the AM  Class I Obesity: Complicates overall prognosis and care. Estimated body mass index is 37.7 kg/m as calculated from the following:   Height as of this encounter: 5\' 3"  (1.6 m).   Weight as of this encounter: 96.5 kg. Weight Loss and Dietary Counseling given  DVT prophylaxis: SCDs Start: 11/06/23 1958 apixaban  (ELIQUIS ) tablet 5 mg    Code Status: Limited: Do not attempt resuscitation (DNR) -DNR-LIMITED -Do Not Intubate/DNI  Family Communication: Daughter @ Bedside  Disposition Plan:  Level of care: Telemetry Cardiac Status is: Inpatient Remains inpatient appropriate because: Anticipating D/C to SNF  in the next 24 hours given that HR is improving and now being transitioned back to po Amiodarone    Consultants:  Orthopedic Surgery D/w Cardiology Dr. Maximo Spar  Procedures:  As delineated as above  Antimicrobials:  Anti-infectives (From admission, onward)    Start     Dose/Rate Route Frequency Ordered Stop   11/08/23 0200  ceFAZolin  (ANCEF ) IVPB 2g/100 mL premix        2 g 200 mL/hr over 30 Minutes Intravenous Every 8 hours 11/07/23 2208 11/08/23 0938   11/07/23 1616  ceFAZolin  (ANCEF ) 2-4 GM/100ML-% IVPB       Note to Pharmacy: [name] E: cabinet override      11/07/23 1616 11/07/23 1807   11/07/23 1615  ceFAZolin  (ANCEF ) IVPB 2g/100 mL premix        2 g 200 mL/hr over 30 Minutes Intravenous  Once 11/07/23 1611 11/07/23 1754       Subjective: Seen and examined at bedside and was feeling weak.  Denied any chest pain or lightheadedness or dizziness.  Daughter was concerned that therapy has not come by yet.  No other concerns or complaints and will wean her off of supplemental oxygen as she appears to be getting more stable.  Objective: Vitals:   11/11/23 0734 11/11/23 1156 11/11/23 1634 11/11/23 1920  BP: (!) 137/46 (!) 116/48 (!) 138/46 (!) 128/48  Pulse: 64 63 68 72  Resp: 12 12 12 16   Temp: 97.6 F (36.4 C) 98 F (36.7 C) 98.2 F (36.8 C) 98.7 F (37.1 C)  TempSrc: Oral Oral Oral Oral  SpO2: 98% 98% 97% 92%  Weight:      Height:        Intake/Output Summary (Last 24 hours) at 11/11/2023 2128 Last data filed at 11/11/2023 1800 Gross per 24 hour  Intake 980.06 ml  Output --  Net 980.06 ml   Filed Weights   11/06/23 1559 11/07/23 1547 11/10/23 2214  Weight: 88.5 kg (P) 90.7 kg 96.5 kg   Examination: Physical Exam:  Constitutional: Obese elderly Caucasian female in NAD appears fatigued Respiratory: Diminished to auscultation bilaterally, no wheezing, rales, rhonchi or crackles. Normal respiratory effort and patient is not tachypenic. No accessory muscle use.   Wearing supplemental oxygen via nasal cannula Cardiovascular: RRR, no murmurs / rubs / gallops. S1 and S2 auscultated.  Mild extremity edema Abdomen: Soft, non-tender, distended secondary to body habitus. Bowel sounds positive.  GU: Deferred. Musculoskeletal: No clubbing / cyanosis of digits/nails. No joint deformity upper and lower extremities. Skin: No rashes, lesions, ulcers on limited skin evaluation. No induration; Warm and dry.  Neurologic: CN 2-12 grossly intact with no focal deficits. Romberg sign and cerebellar reflexes not assessed.  Psychiatric: She is awake and alert and calm but appears a little fatigued  Data Reviewed: I have personally reviewed following labs and imaging studies  CBC: Recent Labs  Lab 11/06/23 1652 11/07/23 0645 11/08/23 0656 11/09/23 0617 11/10/23 0605 11/10/23 1820 11/11/23 0505  WBC 7.2 8.2 11.3* 13.3* 7.8  --  6.9  NEUTROABS 5.2  --  8.9* 8.8* 5.1  --  4.1  HGB 14.7 13.1 11.2* 8.6* 7.9* 8.2* 7.9*  HCT 44.1 39.6 34.6* 26.8* 24.2* 25.1* 24.7*  MCV 100.7* 100.8* 102.7* 102.7* 103.4*  --  105.6*  PLT 127* 116* 138* 130* 108*  --  126*   Basic Metabolic Panel: Recent Labs  Lab 11/08/23 0656 11/09/23 0617 11/10/23 0605 11/10/23 1820 11/11/23 0505  NA 138 138 140 141 139  K 4.5 4.2 3.6 3.5 3.4*  CL 106 102 105 99 101  CO2 23 27 29 30 31   GLUCOSE 148* 121* 119* 123* 121*  BUN 26* 31* 29* 26* 27*  CREATININE 1.16* 0.84 0.71 0.84 0.68  CALCIUM  8.6* 8.1* 8.0* 8.4* 8.0*  MG 2.1 2.1 2.1 2.1 2.1  PHOS 4.2 1.9* 2.2* 2.0* 2.6   GFR: Estimated Creatinine Clearance: 47.5 mL/min (by C-G formula based on SCr of 0.68 mg/dL). Liver Function Tests: Recent Labs  Lab 11/08/23 0656 11/09/23 0617 11/10/23 0605 11/10/23 1820 11/11/23 0505  AST 27 23 28 31 30   ALT 18 13 16 17 16   ALKPHOS 31* 25* 31* 39 36*  BILITOT 0.8 0.7 1.1 0.8 1.3*  PROT 6.1* 5.5* 5.3* 5.5* 5.1*  ALBUMIN 3.1* 2.8* 2.7* 2.7* 2.6*   No results for input(s): "LIPASE", "AMYLASE"  in the last 168 hours. No results for input(s): "AMMONIA" in the last 168 hours. Coagulation Profile: Recent Labs  Lab 11/06/23 1652  INR 1.2   Cardiac Enzymes: No results for input(s): "CKTOTAL", "CKMB", "CKMBINDEX", "TROPONINI" in the last 168 hours. BNP (last 3 results) No results for input(s): "PROBNP" in the last 8760 hours. HbA1C: No results for input(s): "HGBA1C" in the last 72 hours. CBG: No results for input(s): "GLUCAP" in the last 168 hours. Lipid Profile: No results for input(s): "CHOL", "HDL", "LDLCALC", "TRIG", "CHOLHDL", "LDLDIRECT" in the last 72 hours. Thyroid  Function Tests: No results for input(s): "TSH", "T4TOTAL", "FREET4", "T3FREE", "THYROIDAB" in the last 72 hours. Anemia Panel: Recent Labs    11/10/23 0605  VITAMINB12 340  FOLATE 24.2  FERRITIN 68  TIBC 330  IRON 28  RETICCTPCT 4.1*   Sepsis Labs: No results for input(s): "PROCALCITON", "LATICACIDVEN" in the last 168 hours.  Recent Results (from the past 240 hours)  Surgical pcr screen     Status: Abnormal   Collection Time: 11/07/23  1:53 PM   Specimen: Nasal Mucosa; Nasal Swab  Result Value Ref Range Status   MRSA, PCR DETECTED (A) NEGATIVE Final    Comment: RESULT CALLED TO, READ BACK BY AND VERIFIED WITH: RN TStanton Earthly [phone] @1642  FH    Staphylococcus aureus POSITIVE (A) NEGATIVE Final    Comment: (NOTE) The Xpert SA Assay (FDA approved for NASAL specimens in patients 25 years of age and older), is one component of a comprehensive surveillance program. It is not intended to diagnose infection nor to guide or monitor treatment. Performed at [facility], [address] [place], [place] [address]     Radiology Studies: DG CHEST PORT 1 VIEW Result Date: 11/11/2023 CLINICAL DATA:  Shortness of breath and hypertension. EXAM: PORTABLE CHEST 1 VIEW COMPARISON:  11/10/2023 FINDINGS: There is a left chest wall pacer with leads in the right atrial appendage  and right ventricle. Stable  cardiomediastinal contours. Aortic atherosclerotic calcifications. Atelectasis versus airspace disease noted within the periphery of the left base. Visualized osseous structures appear grossly intact. IMPRESSION: Opacity within the periphery of the left base compatible with atelectasis versus airspace disease. Aortic Atherosclerosis (ICD10-I70.0). Electronically Signed   By: Kimberley Penman M.D.   On: 11/11/2023 08:07   DG CHEST PORT 1 VIEW Result Date: 11/10/2023 CLINICAL DATA:  Shortness of breath. EXAM: PORTABLE CHEST 1 VIEW COMPARISON:  11/09/2023 FINDINGS: The lungs are clear without focal pneumonia, edema, pneumothorax or pleural effusion. Cardiopericardial silhouette is at upper limits of normal for size. Degenerative changes noted in both shoulders. Left-sided permanent pacemaker again noted. Telemetry leads overlie the chest. IMPRESSION: No active disease. No features of pulmonary edema on the current study. Electronically Signed   By: Donnal Fusi M.D.   On: 11/10/2023 09:15   Scheduled Meds:  acetaminophen   1,000 mg Oral TID   amiodarone   200 mg Oral BID   apixaban   5 mg Oral BID   brimonidine   1 drop Right Eye Q12H   calcium -vitamin D   1 tablet Oral Q breakfast   Chlorhexidine  Gluconate Cloth  6 each Topical Daily   dextromethorphan -guaiFENesin   1 tablet Oral BID   furosemide   20 mg Oral QAC breakfast   levothyroxine   100 mcg Oral Q0600   lidocaine   1 patch Transdermal Q24H   memantine   10 mg Oral BID   multivitamin with minerals  1 tablet Oral Daily   mupirocin  ointment  1 Application Nasal BID   polyethylene glycol  17 g Oral BID   artificial tears  1 drop Right Eye TID   potassium chloride   10 mEq Oral Daily   potassium chloride   40 mEq Oral BID   senna-docusate  1 tablet Oral BID   timolol   1 drop Right Eye Q12H   Continuous Infusions:   LOS: 5 days   Aura Leeds, DO [facility] Available via Epic secure chat 7am-7pm After these hours, please refer to coverage  provider listed on [email or web address] 11/11/2023, 9:28 PM

## 2023-11-11 NOTE — TOC Progression Note (Signed)
 Transition of Care [facility]) - Progression Note    Patient Details  Name: [name] MRN: [id number] Date of Birth: January 05, 1928  [facility] CM/SW Contact  Nell Gales A [place], LCSW Phone Number: 11/11/2023, 1:37 PM  Clinical Narrative:     CSW contacted Katie at [facility]. She is out of office, CSW spoke with [facility], [phone], social worker with [facility]. She said that bed is still available for [name]. Notified that Richad Champagne will be following pt tomorrow with updates if no other needs arise today.    TOC will continue to follow.   Expected Discharge Plan: Skilled Nursing Facility Barriers to Discharge: Continued Medical Work up  Expected Discharge Plan and Services In-house Referral: Clinical Social Work   Post Acute Care Choice: Skilled Nursing Facility Living arrangements for the past 2 months: Assisted Living Facility ([facility])                                       Social Determinants of Health (SDOH) Interventions SDOH Screenings   Food Insecurity: No Food Insecurity (11/06/2023)  Housing: Low Risk  (11/06/2023)  Transportation Needs: No Transportation Needs (11/06/2023)  Utilities: Not At Risk (11/07/2023)  Depression (PHQ2-9): Low Risk  (04/09/2023)  Social Connections: Moderately Integrated (11/06/2023)  Tobacco Use: Medium Risk (11/06/2023)    Readmission Risk Interventions     No data to display

## 2023-11-11 NOTE — Progress Notes (Signed)
 Physical Therapy Treatment Patient Details Name: [name] MRN: [id number] DOB: 01-25-1928 Today's Date: 11/11/2023   History of Present Illness 89 y.o. female presents to [facility] ED on 11/06/23 from ALF after a fall from attempting to stand. Pt sustained displaced intertrochanteric fx of R hip. Pt is s/p R hip cephulomedullary nail on 11/07/23, WBAT. PMHx: moderate to severe aortic stenosis, sick sinus syndrome status post pacemaker placement, paroxysmal A-fib on Eliquis , hypertension, hypothyroidism and senile dementia.    PT Comments  Pt supine in bed on entry, daughter in room. With increased encouragement pt agreeable to get up to recliner for lunch. Pt is limited in safe mobility by RLE pain, and O2 demand (usually on RA), in presence of decreased R LE AROM, decreased strength and balance. Pt is modAx2 for bed mobility, and maxAx2 for standing in [place]. D/c plans remain appropriate at this time. PT will continue to follow acutely.    If plan is discharge home, recommend the following: A lot of help with walking and/or transfers;A lot of help with bathing/dressing/bathroom;Assist for transportation;Supervision due to cognitive status   Can travel by private vehicle      No  Equipment Recommendations  None recommended by PT    Recommendations for Other Services       Precautions / Restrictions Precautions Precautions: Fall Recall of Precautions/Restrictions: Impaired Restrictions Weight Bearing Restrictions Per Provider Order: Yes RLE Weight Bearing Per Provider Order: Weight bearing as tolerated     Mobility  Bed Mobility Overal bed mobility: Needs Assistance Bed Mobility: Supine to Sit, Sit to Supine, Rolling     Supine to sit: HOB elevated, Used rails, +2 for physical assistance, Mod assist     General bed mobility comments: modAx2 for sequencing movement of LE to EoB and for use of bedrail to pull herself to EoB    Transfers Overall transfer level: Needs  assistance Equipment used: None Transfers: Sit to/from Stand Sit to Stand: Max assist, +2 physical assistance, Via lift equipment, From elevated surface           General transfer comment: educated pt on use of Stedy with demonstration, pt verbalizes understanding and with increased cuing pt is able to [employer] bar to come to standing, increased cuing for upright posture and bringing R LE back underneath her. Pt able to provide some control with decent into recliner to eat her lunch Transfer via Lift Equipment: Stedy        Balance Overall balance assessment: Needs assistance Sitting-balance support: Feet supported, Single extremity supported, Bilateral upper extremity supported Sitting balance-Leahy Scale: Poor Sitting balance - Comments: sitting EOB Postural control: Posterior lean, Right lateral lean                                  Communication Communication Communication: No apparent difficulties  Cognition Arousal: Alert Behavior During Therapy: Flat affect   PT - Cognitive impairments: History of cognitive impairments, Orientation, Awareness, Memory, Attention, Initiation, Sequencing, Problem solving, Safety/Judgement   Orientation impairments: Place, Time, Situation                     Following commands: Impaired Following commands impaired: Follows one step commands inconsistently, Follows one step commands with increased time    Cueing Cueing Techniques: Verbal cues, Tactile cues, Visual cues  Exercises General Exercises - Lower Extremity Ankle Circles/Pumps: AROM, Both, 10 reps, Supine    General Comments  General comments (skin integrity, edema, etc.): 2 daughters present, SpO2 on 2L O2 via [place] 96%O2, SpO2 dropped to 83%O2 on RA, 2L O2 via [place] returned      Pertinent Vitals/Pain Pain Assessment Pain Assessment: Faces Faces Pain Scale: Hurts even more Breathing: normal Negative Vocalization: none Facial Expression: smiling or  inexpressive Body Language: relaxed Consolability: no need to console PAINAD Score: 0 Pain Location: RLE Pain Descriptors / Indicators: Grimacing, Guarding, Moaning Pain Intervention(s): Limited activity within patient's tolerance, Monitored during session, Repositioned     PT Goals (current goals can now be found in the care plan section) Acute Rehab PT Goals Patient Stated Goal: pt unable to state goal at this time PT Goal Formulation: With patient/family Time For Goal Achievement: 11/22/23 Potential to Achieve Goals: Fair Progress towards PT goals: Progressing toward goals    Frequency    Min 3X/week       AM-PAC PT "6 Clicks" Mobility   Outcome Measure  Help needed turning from your back to your side while in a flat bed without using bedrails?: A Lot Help needed moving from lying on your back to sitting on the side of a flat bed without using bedrails?: Total Help needed moving to and from a bed to a chair (including a wheelchair)?: Total Help needed standing up from a chair using your arms (e.g., wheelchair or bedside chair)?: Total Help needed to walk in hospital room?: Total Help needed climbing 3-5 steps with a railing? : Total 6 Click Score: 7    End of Session Equipment Utilized During Treatment: Gait belt;Oxygen Activity Tolerance: Patient limited by pain Patient left: with call bell/phone within reach;with family/visitor present;in chair;with chair alarm set Nurse Communication: Mobility status PT Visit Diagnosis: Unsteadiness on feet (R26.81);Muscle weakness (generalized) (M62.81);History of falling (Z91.81);Pain Pain - Right/Left: Right Pain - part of body: Hip     Time: 1133-1220 PT Time Calculation (min) (ACUTE ONLY): 47 min  Charges:    $Therapeutic Activity: 38-52 mins PT General Charges $$ ACUTE PT VISIT: 1 Visit                     Lexianna Weinrich B. Jewel Mortimer PT, DPT Acute Rehabilitation Services Please use secure chat or  Call Office [phone]    Verlie Glisson [facility] 11/11/2023, 12:51 PM

## 2023-11-11 NOTE — Plan of Care (Signed)

## 2023-11-12 DIAGNOSIS — D696 Thrombocytopenia, unspecified: Secondary | ICD-10-CM | POA: Diagnosis not present

## 2023-11-12 DIAGNOSIS — E039 Hypothyroidism, unspecified: Secondary | ICD-10-CM | POA: Diagnosis not present

## 2023-11-12 DIAGNOSIS — D72829 Elevated white blood cell count, unspecified: Secondary | ICD-10-CM | POA: Diagnosis not present

## 2023-11-12 DIAGNOSIS — I480 Paroxysmal atrial fibrillation: Secondary | ICD-10-CM | POA: Diagnosis not present

## 2023-11-12 DIAGNOSIS — I10 Essential (primary) hypertension: Secondary | ICD-10-CM | POA: Diagnosis not present

## 2023-11-12 DIAGNOSIS — M25551 Pain in right hip: Secondary | ICD-10-CM | POA: Diagnosis not present

## 2023-11-12 DIAGNOSIS — S72001D Fracture of unspecified part of neck of right femur, subsequent encounter for closed fracture with routine healing: Secondary | ICD-10-CM | POA: Diagnosis not present

## 2023-11-12 DIAGNOSIS — K5900 Constipation, unspecified: Secondary | ICD-10-CM | POA: Diagnosis not present

## 2023-11-12 DIAGNOSIS — D62 Acute posthemorrhagic anemia: Secondary | ICD-10-CM | POA: Diagnosis not present

## 2023-11-12 DIAGNOSIS — J9691 Respiratory failure, unspecified with hypoxia: Secondary | ICD-10-CM | POA: Diagnosis not present

## 2023-11-12 DIAGNOSIS — R1319 Other dysphagia: Secondary | ICD-10-CM | POA: Diagnosis not present

## 2023-11-12 DIAGNOSIS — S79929A Unspecified injury of unspecified thigh, initial encounter: Secondary | ICD-10-CM | POA: Diagnosis not present

## 2023-11-12 DIAGNOSIS — N1831 Chronic kidney disease, stage 3a: Secondary | ICD-10-CM | POA: Diagnosis not present

## 2023-11-12 DIAGNOSIS — I509 Heart failure, unspecified: Secondary | ICD-10-CM | POA: Diagnosis not present

## 2023-11-12 DIAGNOSIS — E66811 Obesity, class 1: Secondary | ICD-10-CM | POA: Diagnosis not present

## 2023-11-12 DIAGNOSIS — S81819A Laceration without foreign body, unspecified lower leg, initial encounter: Secondary | ICD-10-CM | POA: Diagnosis not present

## 2023-11-12 DIAGNOSIS — S72121A Displaced fracture of lesser trochanter of right femur, initial encounter for closed fracture: Secondary | ICD-10-CM | POA: Diagnosis not present

## 2023-11-12 DIAGNOSIS — F0390 Unspecified dementia without behavioral disturbance: Secondary | ICD-10-CM | POA: Diagnosis not present

## 2023-11-12 DIAGNOSIS — J9601 Acute respiratory failure with hypoxia: Secondary | ICD-10-CM | POA: Diagnosis not present

## 2023-11-12 DIAGNOSIS — T148XXA Other injury of unspecified body region, initial encounter: Secondary | ICD-10-CM | POA: Diagnosis not present

## 2023-11-12 DIAGNOSIS — S72001S Fracture of unspecified part of neck of right femur, sequela: Secondary | ICD-10-CM | POA: Diagnosis not present

## 2023-11-12 DIAGNOSIS — I350 Nonrheumatic aortic (valve) stenosis: Secondary | ICD-10-CM | POA: Diagnosis not present

## 2023-11-12 DIAGNOSIS — I5033 Acute on chronic diastolic (congestive) heart failure: Secondary | ICD-10-CM | POA: Diagnosis not present

## 2023-11-12 DIAGNOSIS — S72141S Displaced intertrochanteric fracture of right femur, sequela: Secondary | ICD-10-CM | POA: Diagnosis not present

## 2023-11-12 DIAGNOSIS — H44513 Absolute glaucoma, bilateral: Secondary | ICD-10-CM | POA: Diagnosis not present

## 2023-11-12 DIAGNOSIS — K5901 Slow transit constipation: Secondary | ICD-10-CM | POA: Diagnosis not present

## 2023-11-12 DIAGNOSIS — R410 Disorientation, unspecified: Secondary | ICD-10-CM | POA: Diagnosis not present

## 2023-11-12 DIAGNOSIS — E876 Hypokalemia: Secondary | ICD-10-CM | POA: Diagnosis not present

## 2023-11-12 DIAGNOSIS — Z96651 Presence of right artificial knee joint: Secondary | ICD-10-CM | POA: Diagnosis not present

## 2023-11-12 DIAGNOSIS — M150 Primary generalized (osteo)arthritis: Secondary | ICD-10-CM | POA: Diagnosis not present

## 2023-11-12 DIAGNOSIS — Z4789 Encounter for other orthopedic aftercare: Secondary | ICD-10-CM | POA: Diagnosis not present

## 2023-11-12 DIAGNOSIS — Z7401 Bed confinement status: Secondary | ICD-10-CM | POA: Diagnosis not present

## 2023-11-12 DIAGNOSIS — N178 Other acute kidney failure: Secondary | ICD-10-CM | POA: Diagnosis not present

## 2023-11-12 DIAGNOSIS — I959 Hypotension, unspecified: Secondary | ICD-10-CM | POA: Diagnosis not present

## 2023-11-12 DIAGNOSIS — H409 Unspecified glaucoma: Secondary | ICD-10-CM | POA: Diagnosis not present

## 2023-11-12 DIAGNOSIS — F03918 Unspecified dementia, unspecified severity, with other behavioral disturbance: Secondary | ICD-10-CM | POA: Diagnosis not present

## 2023-11-12 DIAGNOSIS — K219 Gastro-esophageal reflux disease without esophagitis: Secondary | ICD-10-CM | POA: Diagnosis not present

## 2023-11-12 DIAGNOSIS — I4819 Other persistent atrial fibrillation: Secondary | ICD-10-CM | POA: Diagnosis not present

## 2023-11-12 DIAGNOSIS — S72141A Displaced intertrochanteric fracture of right femur, initial encounter for closed fracture: Secondary | ICD-10-CM | POA: Diagnosis not present

## 2023-11-12 DIAGNOSIS — I495 Sick sinus syndrome: Secondary | ICD-10-CM | POA: Diagnosis not present

## 2023-11-12 DIAGNOSIS — S72141D Displaced intertrochanteric fracture of right femur, subsequent encounter for closed fracture with routine healing: Secondary | ICD-10-CM | POA: Diagnosis not present

## 2023-11-12 DIAGNOSIS — D509 Iron deficiency anemia, unspecified: Secondary | ICD-10-CM | POA: Diagnosis not present

## 2023-11-12 DIAGNOSIS — K5909 Other constipation: Secondary | ICD-10-CM | POA: Diagnosis not present

## 2023-11-12 DIAGNOSIS — R300 Dysuria: Secondary | ICD-10-CM | POA: Diagnosis not present

## 2023-11-12 DIAGNOSIS — Z950 Presence of cardiac pacemaker: Secondary | ICD-10-CM | POA: Diagnosis not present

## 2023-11-12 LAB — COMPREHENSIVE METABOLIC PANEL WITH GFR
ALT: 18 U/L (ref 0–44)
AST: 32 U/L (ref 15–41)
Albumin: 2.6 g/dL — ABNORMAL LOW (ref 3.5–5.0)
Alkaline Phosphatase: 38 U/L (ref 38–126)
Anion gap: 5 (ref 5–15)
BUN: 20 mg/dL (ref 8–23)
CO2: 31 mmol/L (ref 22–32)
Calcium: 8.1 mg/dL — ABNORMAL LOW (ref 8.9–10.3)
Chloride: 102 mmol/L (ref 98–111)
Creatinine, Ser: 0.7 mg/dL (ref 0.44–1.00)
GFR, Estimated: >60 mL/min (ref >60)
Glucose, Bld: 105 mg/dL — ABNORMAL HIGH (ref 70–99)
Potassium: 4.9 mmol/L (ref 3.5–5.1)
Sodium: 138 mmol/L (ref 135–145)
Total Bilirubin: 1.7 mg/dL — ABNORMAL HIGH (ref 0.0–1.2)
Total Protein: 5.2 g/dL — ABNORMAL LOW (ref 6.5–8.1)

## 2023-11-12 LAB — CBC WITH DIFFERENTIAL/PLATELET
Abs Immature Granulocytes: 0.11 10*3/uL — ABNORMAL HIGH (ref 0.00–0.07)
Basophils Absolute: 0.1 10*3/uL (ref 0.0–0.1)
Basophils Relative: 1 %
Eosinophils Absolute: 0.4 10*3/uL (ref 0.0–0.5)
Eosinophils Relative: 5 %
HCT: 23.8 % — ABNORMAL LOW (ref 36.0–46.0)
Hemoglobin: 7.6 g/dL — ABNORMAL LOW (ref 12.0–15.0)
Immature Granulocytes: 2 %
Lymphocytes Relative: 20 %
Lymphs Abs: 1.4 10*3/uL (ref 0.7–4.0)
MCH: 33.6 pg (ref 26.0–34.0)
MCHC: 31.9 g/dL (ref 30.0–36.0)
MCV: 105.3 fL — ABNORMAL HIGH (ref 80.0–100.0)
Monocytes Absolute: 1 10*3/uL (ref 0.1–1.0)
Monocytes Relative: 14 %
Neutro Abs: 3.9 10*3/uL (ref 1.7–7.7)
Neutrophils Relative %: 58 %
Platelets: 142 10*3/uL — ABNORMAL LOW (ref 150–400)
RBC: 2.26 MIL/uL — ABNORMAL LOW (ref 3.87–5.11)
RDW: 13.5 % (ref 11.5–15.5)
WBC: 6.8 10*3/uL (ref 4.0–10.5)
nRBC: 0.9 % — ABNORMAL HIGH (ref 0.0–0.2)

## 2023-11-12 LAB — PHOSPHORUS: Phosphorus: 2.4 mg/dL — ABNORMAL LOW (ref 2.5–4.6)

## 2023-11-12 LAB — MAGNESIUM: Magnesium: 2.2 mg/dL (ref 1.7–2.4)

## 2023-11-12 MED ORDER — DM-GUAIFENESIN ER 30-600 MG PO TB12: 1.0000 | ORAL_TABLET | Freq: Two times a day (BID) | ORAL | Status: DC

## 2023-11-12 MED ORDER — ALBUTEROL SULFATE (2.5 MG/3ML) 0.083% IN NEBU: 2.5000 mg | INHALATION_SOLUTION | Freq: Four times a day (QID) | RESPIRATORY_TRACT | Status: DC | PRN

## 2023-11-12 MED ORDER — BISACODYL 10 MG RE SUPP: 10.0000 mg | Freq: Every day | RECTAL | Status: DC | PRN

## 2023-11-12 MED ORDER — POLYETHYLENE GLYCOL 3350 17 G PO PACK: 17.0000 g | PACK | Freq: Every day | ORAL | Status: DC

## 2023-11-12 MED ORDER — AMIODARONE HCL 200 MG PO TABS: ORAL_TABLET | ORAL | Status: DC

## 2023-11-12 MED ORDER — ONDANSETRON HCL 4 MG PO TABS: 4.0000 mg | ORAL_TABLET | Freq: Four times a day (QID) | ORAL | Status: DC | PRN

## 2023-11-12 MED ORDER — MUPIROCIN 2 % EX OINT
1.0000 | TOPICAL_OINTMENT | Freq: Two times a day (BID) | CUTANEOUS | Status: DC
Start: 2023-11-12 — End: 2023-11-12

## 2023-11-12 MED ORDER — ACETAMINOPHEN 500 MG PO TABS: 1000.0000 mg | ORAL_TABLET | Freq: Three times a day (TID) | ORAL | Status: DC | PRN

## 2023-11-12 MED ORDER — SENNOSIDES-DOCUSATE SODIUM 8.6-50 MG PO TABS: 1.0000 | ORAL_TABLET | Freq: Every evening | ORAL | Status: DC | PRN

## 2023-11-12 NOTE — Care Management Important Message (Signed)
 Important Message  Patient Details  Name: [name] MRN: [id number] Date of Birth: [birth year]   Important Message Given:  Yes - Medicare IM     Janith Melnick 11/12/2023, 10:37 AM

## 2023-11-12 NOTE — Progress Notes (Signed)
 This RN called report to [facility] at [phone] and spoke with receiving LPN. Report given to Amber,LPN. All questions asked and answered; AVS printed by discharge nurse Tonya,LPN and placed in packet. Daughter at bedside. IV removed. Pt transported to stretcher and tolerated well.

## 2023-11-12 NOTE — Discharge Summary (Signed)
 Physician Discharge Summary   Patient: [name] MRN: [id number] DOB: 1927-10-03  Admit date:     11/06/2023  Discharge date: 11/12/23  Discharge Physician: Aura Leeds, DO   PCP: Tye Gall, MD   Recommendations at discharge:   Follow-up with PCP within 1 to 2 weeks repeat CBC, CMP, mag, Phos within 1 week Follow-up with Cardiology in outpatient setting within 1 to 2 weeks and continue amiodarone  200 mg p.o. twice daily for 7 days and then just 200 mL p.o. daily Follow-up with Orthopedic Surgery Dr. Hermina Loosen within 1 to 2 weeks  Discharge Diagnoses: Principal Problem:   Closed displaced intertrochanteric fracture of right femur, initial encounter [facility])  Resolved Problems:   * No resolved hospital problems. *  Hospital Course: Patient is a 89 year old elderly Caucasian female with a past medical history significant for A-fib on anticoagulation, chronic diastolic CHF, history of aortic stenosis, history of tachybradycardia syndrome and sick sinus dysfunction status post pacemaker, GERD, Sendil dementia, essential hypertension, hypothyroidism as well as other comorbid's who presented from her ALF after a fall.  Patient was ambulating and leaving her bathroom and she tripped on the edge of the rug and fell on her right side.  Further workup was done including a trauma scan and CT pelvis showed an acute comminuted and markedly displaced right intratrochanteric femur fracture.  Orthopedic surgery was consulted and her anticoagulation was held and plan is for surgical intervention with intramedullary nail of the femur. She is POD 4 and now position has been complicated by an AKI, acute blood loss anemia as well acute respiratory failure with hypoxia and acute on chronic diastolic CHF.  She has now been initiated on diuresis and IV fluid hydration has now been discontinued given that her AKI is improved.  Blood count continues to drop slowly and now she went in A-fib with RVR.  She was  given IV Metoprolol  but heart rate continued to be sustained so we will transfer to the cardiac unit and placed on a amiodarone  drip and bolus for now.  Rates have improved and now will wean her back off of the amiodarone  drip and placed her on p.o. amiodarone  at a higher dose.  Hemoglobin remains relatively stable and will wean her off of supplemental oxygen via nasal cannula. She is fatigued and a little lethargic but stable and improved from the time of Admission. She is medically stable to D/C to SNF and will need to follow up with PCP, Cardiology, and Orthopedic Surgery in the outpatient setting within 1-2 weeks.   Assessment and Plan:  Mechanical fall with resultant Right Femur Fracture: Elderly patient presented from ALF after sustaining right hip pain from a mechanical fall. Hip imaging shows closed displaced intertrochanteric fracture of the right femur. Orthopedic surgery consulted, plan for intramedullary nailing of the femur 11/07/23 at 1530. C/w Multimodal pain control with scheduled Acetaminophen  1000 mg po q8h, Lidocaine  Patch 1 Patch TD q24h as well as Hydrocodone -Acetaminophen  5-325 1 tab po q6hprn Moderate Pain and given IV Fentanyl  50 mcg q30m minprn Severe Pain x3 doses; Further Pain control per Ortho -Follow-up vitamin D , 25-Hydroxy was 54.75 -PT/OT recommending SNF -Fall precautions -Bowel Regimen w/ Senna-Docusate 1 tab po qHSprn Mild Consitpation -WBAT per Orthopedic Surgery as well as Resumption of Apixaban ; D/w Dr. Hermina Loosen and ok to resume Apixaban  5 mg po BID  Acute Respiratory Failure w/ Hypoxia in the setting of Acute on Chronic Diastolic CHF: Had low saturations this AM. Will start Flutter Valve and  Incentive. CXR done yesterday and showed Enlarged cardiopericardial silhouette with vascular congestion and pacemaker. Also there was Subtle opacity at the bases favoring atelectasis. She has been on and off oxygen with saturations ranging from 87 to low 90s.  Encourage incentive  spirometry and flutter valve and then reassess. Start Diuresis as below. SpO2: 92 % O2 Flow Rate (L/min): 3 L/min; Repeat CXR yesterday AM showed "Opacity within the periphery of the left base compatible with atelectasis versus airspace disease." Unlikely PNA given that she is afebrile and has no Leukocytosis. Weaned off of Supplemental O2 and will repeat CXR in the outpatient setting    Paroxysmal A-fib with RVR and now improved Sinus node dysfunction s/p PPM: HR was stable in the 60s but she went into A-fib with RVR 6/1/2.  Given a dose of IV metoprolol  without much effect. Discontinued her home Amiodarone  200 mg po at bedtime and place her on a Amio drip and bolus for now and transferred to the progressive care unit for further evaluation and monitoring; Apixaban  resumed. HR is improved and will wean off of Amiodarone  gtt and discussed with Cardiology Dr. Maximo Spar who recommended transitioning to Amiodarone  200 mg po BID x7 days and then back to 200 mg po Daily    Acute on Chronic Diastolic Heart Failure, improving  Aortic Stenosis -Recent TTE from 09/14/2023 showed hyperdynamic EF >75%, severe LVH, G2DD, moderately dilated LA and moderate aortic stenosis. -Repeat CXR shows findings suggestive of CHF with prominent cardiac silhouette and pulmonary interstitial prominence with vascular congestion -Initially held diuretics on Admission and held again due to AKI and given IV Lasix  x2 and resume po Furosemide  20 mg po Daily -Strict I's and O's and Daily Weights; Patient is +1,810.8 -Continue potassium supplementation w/ po KCL 10 mEQ Daily  -Repeat CXR yesterday AM showed "Opacity within the periphery of the left base compatible with atelectasis versus airspace disease. Aortic Atherosclerosis"  Leukocytosis: Likely reactive in the setting of Surgery. WBC trending up and went from 8.2 -> 11.3 -> 13.3 -> 7.8 -> 6.9 -> 6.8. CTM for S/Sx of Infection. Repeat CBC in the AM   Hypokalemia: K+ is now 4.9. CTM and  Replete as Necessary. Repeat CMP w/in 1 week  Hypophosphatemia: Phos Level is now 2.4. CTM and Replete as Necessary. Repeat Phos Level w/in 1 week  Glaucoma: C/w Brimonidine  0.2% Opthalmic Solution in Right Eye q12h, Timolol  0.5% Ophthalmic solution 1 drop Right Eye q12h, and Polyvinal Alcohol  1.4% Ophthalmic 1 Drop in R Eye TID   Hypothyroidism: TSH was 4.206. C/w Levothyroxine  100 mcg po Daily    Dementia: Has poor memory and was a little lethargic and fatigued today. C/w Memantine  10 mg po BID. Checked Vitamin B12 and was 312. Waxes and wanes  Constipation: Change PRN Senna-Docusate to scheduled 1 tab po BID; Also add Miralax  17 gram BID and Bisacodyl 10 mg RC PRN; will give her a one-time dose of bisacodyl 10 mg suppository  AKI: BUN/Cr Trend: improved  Recent Labs  Lab 11/06/23 1652 11/07/23 0645 11/08/23 0656 11/09/23 0617 11/10/23 0605 11/10/23 1820 11/11/23 0505  BUN 18 19 26* 31* 29* 26* 27*  CREATININE 0.93 0.77 1.16* 0.84 0.71 0.84 0.68  -Check U/A, Urine Osm, Urine Cr, Urine Na+; IVF now stopped and will reinitiate IV Lasix  for short-term and then changed back to her oral Lasix  dosing -Avoid Nephrotoxic Medications, Contrast Dyes, Hypotension and Dehydration to Ensure Adequate Renal Perfusion and will need to Renally Adjust Meds. CTM and Trend  Renal Function carefully and repeat CMP w/in 1 week  Macrocytic Anemia/ Acute Blood Loss Anemia in the setting of Post-Operative Drop: Hgb/Hct Trend went from 14.7/44.1 -> 13.1/39.6 -> 11.2/34.6 -> 8.6/26.8 -> 7.9/24.2 -> 8.2/25.1 -> 7.9/24.7 -> 7.6/23.8 (MCV of 105.3). Checked Anemia Panel and showed an iron level of 28, UIBC 302, TIBC 330, saturation ration of 9%, ferritin of 68, folate of 24.2 and vitamin B12 340. CTM for S/Sx of bleeding; No overt bleeding noted now but had some bleeding yesterday after surgery.  Repeat CBC w/in 1 week  Thrombocytopenia: Plt Count went from 127 -> 116 -> 138 -> 130 -> 126 -> 142. CTM for S/Sx of  Bleeding; No overt bleeding noted.   Hyperbilirubinemia: Mild and Likely reactive. Trending up slightly. T Bili went from 0.8 -> 1.3 -> 1.7. CTM and Trend and repeat CMP w/in 1 week  Hypoalbuminemia: Patient's Albumin Level went from 3.1 -> 2.8 -> 2.7 -> 2.6 x2. CTM and Trend and repeat CMP w/in 1 week  Class I Obesity: Complicates overall prognosis and care. Estimated body mass index is 37.7 kg/m as calculated from the following:   Height as of this encounter: 5\' 3"  (1.6 m).   Weight as of this encounter: 96.5 kg. Weight Loss and Dietary Counseling given  Consultants: Orthopedic Surgery; D/w Cardiology  Procedures performed: As delineated as above  Disposition: Skilled nursing facility  Diet recommendation:  Cardiac diet DISCHARGE MEDICATION: Allergies as of 11/12/2023       Reactions   Morphine And Codeine Other (See Comments)   Patient states that she went "crazy" with this   Penicillins Swelling, Rash   Has patient had a PCN reaction causing immediate rash, facial/tongue/throat swelling, SOB or lightheadedness with hypotension: Unknown Has patient had a PCN reaction causing severe rash involving mucus membranes or skin necrosis: Unknown Has patient had a PCN reaction that required hospitalization Unknown Has patient had a PCN reaction occurring within the last 10 years: Unknown If all of the above answers are "NO", then may proceed with Cephalosporin use.   Lasix  [furosemide ] Itching   Patient stated it made her itch all over and dry. Not listed on the [facility]   Aspirin     Pacemaker   Clindamycin/lincomycin Other (See Comments)   Any meds with mycin in the name Unknown reaction Not listed on the [facility]   Crestor  [rosuvastatin ]    Other reaction(s): muscle pain Not listed on the [facility]   Diltiazem     Skin discoloration, lower extremity swelling Not listed on the [facility]   Erythromycin Other (See Comments)   Any meds with mycin in the name Unknown reaction Not listed on the [facility]    Lisinopril Other (See Comments)   REACTION: Cough   Metronidazole    Other reaction(s): mouth sores, tongue swelling   Morphine    Penicillins Cross Reactors Other (See Comments)   Any meds with mycin in the name Unknown reaction        Medication List     TAKE these medications    acetaminophen  500 MG tablet Commonly known as: TYLENOL  Take 2 tablets (1,000 mg total) by mouth every 8 (eight) hours as needed for moderate pain (pain score 4-6). What changed:  when to take this reasons to take this   albuterol  (2.5 MG/3ML) 0.083% nebulizer solution Commonly known as: PROVENTIL  Take 3 mLs (2.5 mg total) by nebulization every 6 (six) hours as needed for wheezing or shortness of breath.   amiodarone  200 MG tablet  Commonly known as: PACERONE  Take 1 tablet (200 mg total) by mouth 2 (two) times daily for 7 days, THEN 1 tablet (200 mg total) daily for 7 days. Start taking on: November 12, 2023 What changed: See the new instructions.   apixaban  5 MG Tabs tablet Commonly known as: Eliquis  Take 1 tablet (5 mg total) by mouth 2 (two) times daily.   betamethasone  (augmented) 0.05 % gel Commonly known as: DIPROLENE  Apply topically daily.  Apply to scalp topically every 12 hours as needed for psoriasis related to PSORIASIS, UNSPECIFIED (L40.9) apply to affected area of scalp   bisacodyl 10 MG suppository Commonly known as: DULCOLAX Place 1 suppository (10 mg total) rectally daily as needed for moderate constipation.   Calcium  600+D Plus Minerals 600-400 MG-UNIT Tabs Take 1 tablet by mouth daily. What changed: when to take this   Combigan  0.2-0.5 % ophthalmic solution Generic drug: brimonidine -timolol  Place 1 drop into the right eye every 12 (twelve) hours.   dextromethorphan -guaiFENesin  30-600 MG 12hr tablet Commonly known as: MUCINEX  DM Take 1 tablet by mouth 2 (two) times daily.   furosemide  20 MG tablet Commonly known as: LASIX  TAKE ONE TABLET BY MOUTH EVERY DAY What  changed: when to take this   HM Lidocaine  Patch 4 % Generic drug: lidocaine  Place 1 patch onto the skin in the morning. Apply topically to L shoulder in the morning. Apply for 12 hours (0900) then removed for 12 hours (2100).   Ipratropium-Albuterol  20-100 MCG/ACT Aers respimat Commonly known as: COMBIVENT Inhale 2 puffs into the lungs every 6 (six) hours as needed for shortness of breath.   levothyroxine  100 MCG tablet Commonly known as: SYNTHROID  TAKE ONE TABLET BY MOUTH EVERY DAY IN THE MORNING   Multivitamin Adult Tabs Take 1 tablet by mouth in the morning.   mupirocin  ointment 2 % Commonly known as: BACTROBAN  Place 1 Application into the nose 2 (two) times daily.   Namenda  10 MG tablet Generic drug: memantine  TAKE ONE TABLET BY MOUTH TWICE A DAY FOR COGNITIVE DECLINE What changed: See the new instructions.   nystatin cream Commonly known as: MYCOSTATIN Apply 1 Application topically every 12 (twelve) hours as needed (for rash).   OCUVITE PO Take 1 capsule by mouth in the morning.   ondansetron  4 MG tablet Commonly known as: ZOFRAN  Take 1 tablet (4 mg total) by mouth every 6 (six) hours as needed for nausea.   oxyCODONE  5 MG immediate release tablet Commonly known as: Roxicodone  Take 1 tablet (5 mg total) by mouth every 4 (four) hours as needed for severe pain (pain score 7-10) or breakthrough pain.   pantoprazole 40 MG tablet Commonly known as: PROTONIX Take 40 mg by mouth daily as needed (for indigestion).   polyethylene glycol 17 g packet Commonly known as: MIRALAX  / GLYCOLAX  Take 17 g by mouth daily.   potassium chloride  10 MEQ tablet Commonly known as: KLOR-CON  M Take 1 tablet (10 mEq total) by mouth daily. What changed: when to take this   Prevagen 10 MG Caps Generic drug: Apoaequorin Take 10 mg by mouth daily. What changed: when to take this   senna-docusate 8.6-50 MG tablet Commonly known as: Senokot-S Take 1 tablet by mouth at bedtime as needed  for mild constipation.   Systane Hydration PF 0.4-0.3 % Soln Generic drug: Polyethyl Glyc-Propyl Glyc PF Place 1 drop into the right eye in the morning, at noon, in the evening, and at bedtime.   Voltaren  1 % Gel Generic drug: diclofenac  Sodium  Apply 1 g topically 2 (two) times daily as needed (to L foot bunion deformity for pain).               Discharge Care Instructions  (From admission, onward)           Start     Ordered   11/12/23 0000  Discharge wound care:       Comments: 2 times daily      Comments: Cleanse underneath pannus/abdominal folds with Vashe wound cleanser Timm Foot [phone]) do not rinse and allow to air dry. Sprinkle floor stock microguard antifungal powder (white and green label) to area then apply Interdry AG as follows:  Order Timm Foot # [phone] Measure and cut length of InterDry to fit in skin folds that have skin breakdown Tuck InterDry fabric into skin folds in a single layer, allow for 2 inches of overhang from skin edges to allow for wicking to occur May remove to bathe; dry area thoroughly and then tuck into affected areas again Do not apply any creams or ointments when using InterDry DO NOT THROW AWAY FOR 5 DAYS unless soiled with stool DO NOT [facility] product, this will inactivate the silver in the material  New sheet of Interdry should be applied after 5 days of use if patient continues to have skin breakdown   11/12/23 1221            Contact information for follow-up providers     Wilhelmenia Harada, MD Follow up.   Specialty: Orthopedic Surgery Contact information: [address] [place] [address] [phone]              Contact information for after-discharge care     Destination     [facility] .   Service: Skilled Nursing Contact information: [address] [place]  [phone]                    Discharge Exam: Cleavon Curls Weights   11/06/23  1559 11/07/23 1547 11/10/23 2214  Weight: 88.5 kg (P) 90.7 kg 96.5 kg   Vitals:   11/12/23 1100 11/12/23 1159  BP:  (!) 114/38  Pulse:  60  Resp:  12  Temp:  98.8 F (37.1 C)  SpO2: 100% 96%   Examination: Physical Exam:  Constitutional: Obese elderly Caucasian female who appears little fatigued but arousable in no acute distress appears calm Respiratory: Diminished to auscultation bilaterally with some coarse breath sounds and some slight rhonchi and mild crackles but no peripheral wheezing or rales. Normal respiratory effort and patient is not tachypenic. No accessory muscle use.  Not wearing supplemental oxygen and O2 saturation 99% Cardiovascular: RRR, no murmurs / rubs / gallops. S1 and S2 auscultated.  Minimal extremity edema Abdomen: Soft, non-tender, distended secondary to body habitus. Bowel sounds positive.  GU: Deferred. Musculoskeletal: No clubbing / cyanosis of digits/nails. No joint deformity upper and lower extremities. Skin: No rashes, lesions, ulcers on limited skin evaluation. No induration; Warm and dry.  Neurologic: A little drowsy and fatigued but easily arousable and CN 2-12 grossly intact with no focal deficits. Romberg sign cerebellar reflexes not assessed.  Psychiatric: Appears calm  Condition at discharge: stable  The results of significant diagnostics from this hospitalization (including imaging, microbiology, ancillary and laboratory) are listed below for reference.   Imaging Studies: DG CHEST PORT 1 VIEW Result Date: 11/11/2023 CLINICAL DATA:  Shortness of breath and hypertension. EXAM: PORTABLE CHEST 1 VIEW COMPARISON:  11/10/2023  FINDINGS: There is a left chest wall pacer with leads in the right atrial appendage and right ventricle. Stable cardiomediastinal contours. Aortic atherosclerotic calcifications. Atelectasis versus airspace disease noted within the periphery of the left base. Visualized osseous structures appear grossly intact. IMPRESSION: Opacity  within the periphery of the left base compatible with atelectasis versus airspace disease. Aortic Atherosclerosis (ICD10-I70.0). Electronically Signed   By: Kimberley Penman M.D.   On: 11/11/2023 08:07   DG CHEST PORT 1 VIEW Result Date: 11/10/2023 CLINICAL DATA:  Shortness of breath. EXAM: PORTABLE CHEST 1 VIEW COMPARISON:  11/09/2023 FINDINGS: The lungs are clear without focal pneumonia, edema, pneumothorax or pleural effusion. Cardiopericardial silhouette is at upper limits of normal for size. Degenerative changes noted in both shoulders. Left-sided permanent pacemaker again noted. Telemetry leads overlie the chest. IMPRESSION: No active disease. No features of pulmonary edema on the current study. Electronically Signed   By: Donnal Fusi M.D.   On: 11/10/2023 09:15   DG CHEST PORT 1 VIEW Result Date: 11/09/2023 CLINICAL DATA:  [id number] SOB (shortness of breath) [id number] EXAM: PORTABLE CHEST - 1 VIEW COMPARISON:  11/08/2023. FINDINGS: Cardiac silhouette is prominent. There is pulmonary interstitial prominence with vascular congestion. No focal consolidation. No pneumothorax or pleural effusion identified. Aorta is calcified. There is a left-sided pacer. IMPRESSION: Findings suggest CHF. Electronically Signed   By: Sydell Eva M.D.   On: 11/09/2023 08:38   DG CHEST PORT 1 VIEW Result Date: 11/08/2023 CLINICAL DATA:  Shortness of breath EXAM: PORTABLE CHEST 1 VIEW COMPARISON:  Chest x-[place] 11/06/2023 FINDINGS: Enlarged cardiopericardial silhouette. Calcified aorta. Prominent central vascular congestion. Left upper chest pacemaker leads along the right side of the heart. Subtle opacity at the lung bases. Atelectasis is favored. Recommend follow-up. No pneumothorax. No effusion or consolidation. Films are under penetrated. IMPRESSION: Enlarged cardiopericardial silhouette with vascular congestion and pacemaker. Subtle opacity at the bases, favor atelectasis. Recommend follow-up. Electronically Signed   By:  Adrianna Horde M.D.   On: 11/08/2023 14:59   DG FEMUR, MIN 2 VIEWS RIGHT Result Date: 11/07/2023 CLINICAL DATA:  Elective surgery. EXAM: RIGHT FEMUR 2 VIEWS COMPARISON:  Preoperative imaging FINDINGS: Nine fluoroscopic spot views of the right femur submitted from the operating room. Femoral intramedullary nail with trans trochanteric and distal locking screw fixation of proximal femur fracture. Fluoroscopy time 176.7 seconds. Dose 71.17 mGy. IMPRESSION: Intraoperative fluoroscopy during proximal femur fracture fixation. Electronically Signed   By: Chadwick Colonel M.D.   On: 11/07/2023 20:22   DG C-Arm 1-60 Min-No Report Result Date: 11/07/2023 Fluoroscopy was utilized by the requesting physician.  No radiographic interpretation.   DG C-Arm 1-60 Min-No Report Result Date: 11/07/2023 Fluoroscopy was utilized by the requesting physician.  No radiographic interpretation.   CT HEAD WO CONTRAST Result Date: 11/06/2023 CLINICAL DATA:  Head trauma, mechanical fall. EXAM: CT HEAD WITHOUT CONTRAST CT CERVICAL SPINE WITHOUT CONTRAST TECHNIQUE: Multidetector CT imaging of the head and cervical spine was performed following the standard protocol without intravenous contrast. Multiplanar CT image reconstructions of the cervical spine were also generated. RADIATION DOSE REDUCTION: This exam was performed according to the departmental dose-optimization program which includes automated exposure control, adjustment of the mA and/or kV according to patient size and/or use of iterative reconstruction technique. COMPARISON:  CT head 09/13/2023, CT cervical spine 09/25/2020. FINDINGS: CT HEAD FINDINGS Brain: No acute intracranial hemorrhage. No CT evidence of acute infarct. Nonspecific hypoattenuation in the periventricular and subcortical white matter favored to reflect chronic microvascular ischemic changes. Generalized parenchymal  volume loss. No edema, mass effect, or midline shift. The basilar cisterns are patent.  Ventricles: The ventricles are normal. Vascular: Atherosclerotic calcifications of the carotid siphons. No hyperdense vessel. Skull: No acute or aggressive finding. Orbits: Orbits are symmetric. Sinuses: Complete opacification of the left maxillary sinus. Mild mucosal thickening in the ethmoid sinuses and right sphenoid sinus. Other: Mastoid air cells are clear. CT CERVICAL SPINE FINDINGS Alignment: Straightening of the normal cervical lordosis. No significant listhesis. No facet subluxation or dislocation. Skull base and vertebrae: No acute fracture. No primary bone lesion or focal pathologic process. Soft tissues and spinal canal: No prevertebral fluid or swelling. No visible canal hematoma. Disc levels: Intervertebral disc space narrowing at multiple levels most pronounced at C4-5. Disc osteophyte complexes at multiple levels. Disc bulge and thickening of the ligamentum flavum at C3-4 resulting in moderate spinal canal stenosis. Additional disc osteophyte complexes at C4-5 and C5-6 contributing to moderate spinal canal stenosis. Facet arthrosis and uncovertebral hypertrophy at multiple levels. Foraminal narrowing most pronounced on the left at C6-7. Upper chest: Negative. Other: None. IMPRESSION: No CT evidence of acute intracranial abnormality. No acute fracture or traumatic malalignment of the cervical spine. Degenerative changes as above. Moderate chronic microvascular ischemic changes and generalized parenchymal volume loss. Electronically Signed   By: Denny Flack M.D.   On: 11/06/2023 18:25   CT CERVICAL SPINE WO CONTRAST Result Date: 11/06/2023 CLINICAL DATA:  Head trauma, mechanical fall. EXAM: CT HEAD WITHOUT CONTRAST CT CERVICAL SPINE WITHOUT CONTRAST TECHNIQUE: Multidetector CT imaging of the head and cervical spine was performed following the standard protocol without intravenous contrast. Multiplanar CT image reconstructions of the cervical spine were also generated. RADIATION DOSE REDUCTION:  This exam was performed according to the departmental dose-optimization program which includes automated exposure control, adjustment of the mA and/or kV according to patient size and/or use of iterative reconstruction technique. COMPARISON:  CT head 09/13/2023, CT cervical spine 09/25/2020. FINDINGS: CT HEAD FINDINGS Brain: No acute intracranial hemorrhage. No CT evidence of acute infarct. Nonspecific hypoattenuation in the periventricular and subcortical white matter favored to reflect chronic microvascular ischemic changes. Generalized parenchymal volume loss. No edema, mass effect, or midline shift. The basilar cisterns are patent. Ventricles: The ventricles are normal. Vascular: Atherosclerotic calcifications of the carotid siphons. No hyperdense vessel. Skull: No acute or aggressive finding. Orbits: Orbits are symmetric. Sinuses: Complete opacification of the left maxillary sinus. Mild mucosal thickening in the ethmoid sinuses and right sphenoid sinus. Other: Mastoid air cells are clear. CT CERVICAL SPINE FINDINGS Alignment: Straightening of the normal cervical lordosis. No significant listhesis. No facet subluxation or dislocation. Skull base and vertebrae: No acute fracture. No primary bone lesion or focal pathologic process. Soft tissues and spinal canal: No prevertebral fluid or swelling. No visible canal hematoma. Disc levels: Intervertebral disc space narrowing at multiple levels most pronounced at C4-5. Disc osteophyte complexes at multiple levels. Disc bulge and thickening of the ligamentum flavum at C3-4 resulting in moderate spinal canal stenosis. Additional disc osteophyte complexes at C4-5 and C5-6 contributing to moderate spinal canal stenosis. Facet arthrosis and uncovertebral hypertrophy at multiple levels. Foraminal narrowing most pronounced on the left at C6-7. Upper chest: Negative. Other: None. IMPRESSION: No CT evidence of acute intracranial abnormality. No acute fracture or traumatic  malalignment of the cervical spine. Degenerative changes as above. Moderate chronic microvascular ischemic changes and generalized parenchymal volume loss. Electronically Signed   By: Denny Flack M.D.   On: 11/06/2023 18:25   CT PELVIS WO CONTRAST  Result Date: 11/06/2023 CLINICAL DATA:  Hip trauma, fracture suspected, xray done Right hip pain. EXAM: CT OF THE PELVIS WITHOUT CONTRAST TECHNIQUE: Multidetector CT imaging of the pelvis was performed according to the standard protocol. Multiplanar CT image reconstructions were also generated. RADIATION DOSE REDUCTION: This exam was performed according to the departmental dose-optimization program which includes automated exposure control, adjustment of the mA and/or kV according to patient size and/or use of iterative reconstruction technique. COMPARISON:  Radiograph earlier today FINDINGS: Bones/Joint/Cartilage Comminuted and displaced proximal femur fracture. There is a displaced component involving the lesser trochanter. There is a transverse subtrochanteric component. Fracture extends to involve the base of the femoral neck. No hip dislocation, the femoral head remains seated. Intact pubic rami. No sacral fracture. Minimal right hip joint effusion. Ligaments Suboptimally assessed by CT. Muscles and Tendons Mild edema in the skill a chair today shin to the fracture. No muscle atrophy. Soft tissues Colonic diverticulosis without diverticulitis. Stable 3.7 cm cystic structure in the left adnexa adjacent to the left aspect of the vaginal cuff from 2023 hip CT, likely benign given stability. There is a fat containing umbilical hernia. Aortic atherosclerosis. IMPRESSION: Comminuted and displaced proximal right femur fracture with a displaced component involving the lesser trochanter. There is a transverse subtrochanteric component. Fracture extends to involve the base of the femoral neck. Aortic Atherosclerosis (ICD10-I70.0). Electronically Signed   By: Chadwick Colonel M.D.   On: 11/06/2023 18:19   DG Hip Unilat With Pelvis 2-3 Views Right Result Date: 11/06/2023 CLINICAL DATA:  fall EXAM: DG HIP (WITH OR WITHOUT PELVIS) 2-3V RIGHT COMPARISON:  None Available. FINDINGS: Acute comminuted and markedly displaced right intertrochanteric femoral fracture. No right hip dislocation. Query slight irregularity of the left femoral neck. Poorly visualized left hip- limited evaluation due to overlapping osseous structures and overlying soft tissues. view. No acute displaced fracture or diastasis of the bones of the pelvis. No acute displaced fractured diastasis of the bones of the pelvis. There is no evidence of severe arthropathy or other focal bone abnormality. IMPRESSION: Acute comminuted and markedly displaced right intertrochanteric femoral fracture. Poorly visualized left hip- limited evaluation due to overlapping osseous structures and overlying soft tissues. Query slight irregularity of the left femoral neck. Recommend dedicated three view radiograph of the left hip for further evaluation. Electronically Signed   By: Morgane  Naveau M.D.   On: 11/06/2023 17:48   DG Chest Port 1 View Result Date: 11/06/2023 CLINICAL DATA:  fall EXAM: PORTABLE CHEST 1 VIEW COMPARISON:  Chest x-Sabia 09/13/2023 FINDINGS: Left chest wall dual lead pacemaker. The heart and mediastinal contours are unchanged. Atherosclerotic plaque. Left base atelectasis. No focal consolidation. Chronic coarsened interstitial markings with no overt pulmonary edema. No pleural effusion. No pneumothorax. No acute osseous abnormality. IMPRESSION: 1. No active disease. 2.  Aortic Atherosclerosis (ICD10-I70.0). Electronically Signed   By: Morgane  Naveau M.D.   On: 11/06/2023 17:43   Microbiology: Results for orders placed or performed during the hospital encounter of 11/06/23  Surgical pcr screen     Status: Abnormal   Collection Time: 11/07/23  1:53 PM   Specimen: Nasal Mucosa; Nasal Swab  Result Value Ref  Range Status   MRSA, PCR DETECTED (A) NEGATIVE Final    Comment: RESULT CALLED TO, READ BACK BY AND VERIFIED WITH: RN TStanton Earthly [phone] @1642  FH    Staphylococcus aureus POSITIVE (A) NEGATIVE Final    Comment: (NOTE) The Xpert SA Assay (FDA approved for NASAL specimens in patients 22 years of  age and older), is one component of a comprehensive surveillance program. It is not intended to diagnose infection nor to guide or monitor treatment. Performed at [facility], [address], [place], [place] [address]    Labs: CBC: Recent Labs  Lab 11/08/23 [phone] 11/09/23 0617 11/10/23 0605 11/10/23 1820 11/11/23 0505 11/12/23 0608  WBC 11.3* 13.3* 7.8  --  6.9 6.8  NEUTROABS 8.9* 8.8* 5.1  --  4.1 3.9  HGB 11.2* 8.6* 7.9* 8.2* 7.9* 7.6*  HCT 34.6* 26.8* 24.2* 25.1* 24.7* 23.8*  MCV 102.7* 102.7* 103.4*  --  105.6* 105.3*  PLT 138* 130* 108*  --  126* 142*   Basic Metabolic Panel: Recent Labs  Lab 11/09/23 0617 11/10/23 0605 11/10/23 1820 11/11/23 0505 11/12/23 0608  NA 138 140 141 139 138  K 4.2 3.6 3.5 3.4* 4.9  CL 102 105 99 101 102  CO2 27 29 30 31 31   GLUCOSE 121* 119* 123* 121* 105*  BUN 31* 29* 26* 27* 20  CREATININE 0.84 0.71 0.84 0.68 0.70  CALCIUM  8.1* 8.0* 8.4* 8.0* 8.1*  MG 2.1 2.1 2.1 2.1 2.2  PHOS 1.9* 2.2* 2.0* 2.6 2.4*   Liver Function Tests: Recent Labs  Lab 11/09/23 0617 11/10/23 0605 11/10/23 1820 11/11/23 0505 11/12/23 0608  AST 23 28 31 30  32  ALT 13 16 17 16 18   ALKPHOS 25* 31* 39 36* 38  BILITOT 0.7 1.1 0.8 1.3* 1.7*  PROT 5.5* 5.3* 5.5* 5.1* 5.2*  ALBUMIN 2.8* 2.7* 2.7* 2.6* 2.6*   CBG: No results for input(s): "GLUCAP" in the last 168 hours.  Discharge time spent: greater than 30 minutes.  Signed: Aura Leeds, DO [facility] 11/12/2023

## 2023-11-12 NOTE — TOC Progression Note (Addendum)
 Transition of Care [facility]) - Progression Note    Patient Details  Name: GALILEA QUITO MRN: [id number] Date of Birth: [birth year]  [facility] CM/SW Contact  Carmon Christen, LCSWA Phone Number: 11/12/2023, 10:27 AM  Clinical Narrative:     CSW called Katie at [facility] who is checking to see if able to accept patient today if medically stable. CSW awaiting call back.  CSW received call back from [place] with [facility] who confirmed patient can dc over today if medically stable. CSW informed MD.   Expected Discharge Plan: Skilled Nursing Facility Barriers to Discharge: Continued Medical Work up  Expected Discharge Plan and Services In-house Referral: Clinical Social Work   Post Acute Care Choice: Skilled Nursing Facility Living arrangements for the past 2 months: Assisted Living Facility ([facility])                                       Social Determinants of Health (SDOH) Interventions SDOH Screenings   Food Insecurity: No Food Insecurity (11/06/2023)  Housing: Low Risk  (11/06/2023)  Transportation Needs: No Transportation Needs (11/06/2023)  Utilities: Not At Risk (11/07/2023)  Depression (PHQ2-9): Low Risk  (04/09/2023)  Social Connections: Moderately Integrated (11/06/2023)  Tobacco Use: Medium Risk (11/06/2023)    Readmission Risk Interventions     No data to display

## 2023-11-12 NOTE — TOC Transition Note (Signed)
 Transition of Care [facility]) - Discharge Note   Patient Details  Name: [name] MRN: [id number] Date of Birth: 1928-01-27  [facility]) CM/SW Contact:  Carmon Christen, LCSWA Phone Number: 11/12/2023, 12:11 PM   Clinical Narrative:     Patient will DC to: [facility]  Anticipated DC date: 11/12/2023  Family notified: Fredrik Jensen   Transport by: Lyna Sandhoff  ?  Per MD patient ready for DC to [facility] . RN, patient, patient's family, and facility notified of DC. Discharge Summary sent to facility. RN given number for report [phone] EXT: [id number] RM#[id number]. DC packet on chart. DNR signed by MD attached to patients DC packet. Ambulance transport requested for patient.  CSW signing off.   Final next level of care: Skilled Nursing Facility Barriers to Discharge: Continued Medical Work up   Patient Goals and CMS Choice Patient states their goals for this hospitalization and ongoing recovery are:: SNF   Choice offered to / list presented to : Patient, Adult Children (patient and daughter Fredrik Jensen)      Discharge Placement              Patient chooses bed at: [facility] Patient to be transferred to facility by: [facility] Name of family member notified: Fredrik Jensen Patient and family notified of of transfer: 11/12/23  Discharge Plan and Services Additional resources added to the After Visit Summary for   In-house Referral: Clinical Social Work   Post Acute Care Choice: Skilled Nursing Facility                               Social Drivers of Health (SDOH) Interventions SDOH Screenings   Food Insecurity: No Food Insecurity (11/06/2023)  Housing: Low Risk  (11/06/2023)  Transportation Needs: No Transportation Needs (11/06/2023)  Utilities: Not At Risk (11/07/2023)  Depression (PHQ2-9): Low Risk  (04/09/2023)  Social Connections: Moderately Integrated (11/06/2023)  Tobacco Use: Medium Risk (11/06/2023)     Readmission Risk Interventions      No data to display

## 2023-11-13 ENCOUNTER — Non-Acute Institutional Stay (SKILLED_NURSING_FACILITY): Payer: Self-pay | Admitting: Adult Health

## 2023-11-13 ENCOUNTER — Encounter: Payer: Self-pay | Admitting: Adult Health

## 2023-11-13 DIAGNOSIS — I4819 Other persistent atrial fibrillation: Secondary | ICD-10-CM | POA: Diagnosis not present

## 2023-11-13 DIAGNOSIS — D62 Acute posthemorrhagic anemia: Secondary | ICD-10-CM | POA: Diagnosis not present

## 2023-11-13 DIAGNOSIS — H409 Unspecified glaucoma: Secondary | ICD-10-CM | POA: Diagnosis not present

## 2023-11-13 DIAGNOSIS — E876 Hypokalemia: Secondary | ICD-10-CM | POA: Diagnosis not present

## 2023-11-13 DIAGNOSIS — S72141S Displaced intertrochanteric fracture of right femur, sequela: Secondary | ICD-10-CM

## 2023-11-13 DIAGNOSIS — E039 Hypothyroidism, unspecified: Secondary | ICD-10-CM

## 2023-11-13 DIAGNOSIS — I5033 Acute on chronic diastolic (congestive) heart failure: Secondary | ICD-10-CM

## 2023-11-13 DIAGNOSIS — K5901 Slow transit constipation: Secondary | ICD-10-CM

## 2023-11-13 MED ORDER — OXYCODONE HCL 5 MG PO TABS: 5.0000 mg | ORAL_TABLET | Freq: Four times a day (QID) | ORAL | 0 refills | Status: DC | PRN

## 2023-11-13 MED ORDER — OXYCODONE HCL 5 MG PO TABS: 5.0000 mg | ORAL_TABLET | ORAL | 0 refills | Status: DC | PRN

## 2023-11-13 MED ORDER — IRON (FERROUS SULFATE) 325 (65 FE) MG PO TABS: 325.0000 mg | ORAL_TABLET | Freq: Every day | ORAL | Status: DC

## 2023-11-13 MED ORDER — SENNA-DOCUSATE SODIUM 8.6-50 MG PO TABS: 1.0000 | ORAL_TABLET | Freq: Every day | ORAL | Status: AC

## 2023-11-13 MED ORDER — POLYETHYLENE GLYCOL 3350 17 G PO PACK
17.0000 g | PACK | ORAL | Status: AC
Start: 2023-11-13 — End: ?

## 2023-11-13 NOTE — Progress Notes (Signed)
 Location:  [facility] Room Number: 15 A Place of Service:  SNF (31) Provider:  Duncan Gibson, NP    Patient Care Team: Tye Gall, MD as PCP - General (Internal Medicine) Jolly Needle, MD (Inactive) as PCP - Electrophysiology (Cardiology) Adair Actis, FNP (Hospice and Palliative Medicine)  Extended Emergency Contact Information Primary Emergency Contact: [facility] Phone: [phone] Mobile Phone: [phone] Relation: Daughter Interpreter needed? No Secondary Emergency Contact: Pearman,Karen Address: [address]          [place], [place] [address] [place] Home Phone: [phone] Mobile Phone: [phone] Relation: Daughter  Code Status:  DNR Goals of care: Advanced Directive information    11/07/2023    3:55 PM  Advanced Directives  Does Patient Have a Medical Advance Directive? Yes  Type of Estate agent of [place];Living will  Copy of Healthcare Power of Attorney in Chart? Yes - validated most recent copy scanned in chart (See row information)     Chief Complaint  Patient presents with   Hospitalization Follow-up    HPI:  Pt is a 89 y.o. female seen today for a hospital follow up.  She was hospitalized 11/06/2023 to 11/12/2023 S/P 11/06/23 at her ALF sustaining a closed displaced intertrochanteric fracture of right femur.  She apparently was ambulating and leaving her bathroom and tripped on the edge of the rug and fell on her right side.  CT pelvis showed an acute comminuted and mildly displaced right internal trochanteric femur fracture.  Orthopedic was consulted and her anticoagulation was held.  On 11/07/2023 she had right hip intramedullary nailing.  Hospitalization was complicated by an AKI, acute blood loss anemia and acute respiratory failure with hypoxia, Afib with RVR and acute on chronic diastolic CHF.  She is now WBAT and resumed on apixaban  5 mg twice a day.     She has been admitted to [facility] on 11/12/2023 for a short-term rehabilitation.  She was seen in the day with daughter at bedside.She was sleeping in and woke up to verbal greetings.   Past Medical History:  Diagnosis Date   Aortic stenosis 03/08/2016   Brady-tachy syndrome (HCC)    a. Biotronik dual chamber PPM implanted 2009 b. gen change to STJ dual chamber PPM 2017   Ejection fraction    EF 70%, echo, October, 2009  //   EF 55-60%, septal dyssynergy consistent with a paced rhythm, mild to moderate mitral regurgitation, echo, November, 2015    GERD (gastroesophageal reflux disease) 04/28/2014   Episodes November, 2015 with fluid refluxing from her esophagus.    Hair loss    Patient questioned Coumadin, changed toPradaxa   Hypertension    Hypothyroidism    Lower extremity edema 01/22/2018   Mitral regurgitation 05/03/2014   Mild-to-moderate mitral regurgitation, echo, November, 2015    Osteoarthritis of left knee 10/25/2017   Paroxysmal atrial fibrillation (HCC)    Episodes rapid atrial fib noted by pacemaker interrogation, August, 2011, diltiazem  added, patient improved. Patient continues on Rythmol   //   Changed to flecainide  2013  //   flecainide  level checked in 2013, good level    Persistent atrial fibrillation (HCC)    Presence of permanent cardiac pacemaker    Past Surgical History:  Procedure Laterality Date   ABDOMINAL HYSTERECTOMY     EP IMPLANTABLE DEVICE N/A 08/08/2015   Generator change with SJM Assurity DR PPM by Dr Nunzio Belch   FEMUR IM NAIL Right 11/07/2023  Procedure: INSERTION, INTRAMEDULLARY ROD, FEMUR, RETROGRADE;  Surgeon: Wilhelmenia Harada, MD;  Location: [facility] OR;  Service: Orthopedics;  Laterality: Right;   JOINT REPLACEMENT     PACEMAKER PLACEMENT  2009        Allergies  Allergen Reactions   Morphine And Codeine Other (See Comments)    Patient states that she went "crazy" with this   Penicillins Swelling and Rash    Has patient had a PCN  reaction causing immediate rash, facial/tongue/throat swelling, SOB or lightheadedness with hypotension: Has patient had a PCN reaction causing severe rash involving mucus membranes or skin necrosis:  Has patient had a PCN reaction that required hospitalization  Has patient had a PCN reaction occurring within the last 10 years:  If all of the above answers are "NO", then may proceed with Cephalosporin use.   Aspirin      Pacemaker   Clindamycin/Lincomycin Other (See Comments)    Any meds with mycin in the name Unknown reaction Not listed on the [facility]   Crestor  [Rosuvastatin ]     Other reaction(s): muscle pain Not listed on the [facility]   Diltiazem      Skin discoloration, lower extremity swelling Not listed on the [facility]   Erythromycin Other (See Comments)    Any meds with mycin in the name Unknown reaction Not listed on the [facility]   Lisinopril Other (See Comments)    REACTION: Cough   Metronidazole     Other reaction(s): mouth sores, tongue swelling   Morphine    Penicillins Cross Reactors Other (See Comments)    Any meds with mycin in the name Unknown reaction    Outpatient Encounter Medications as of 11/13/2023  Medication Sig   acetaminophen  (TYLENOL ) 500 MG tablet Take 2 tablets (1,000 mg total) by mouth every 8 (eight) hours as needed for moderate pain (pain score 4-6).   albuterol  (PROVENTIL ) (2.5 MG/3ML) 0.083% nebulizer solution Take 3 mLs (2.5 mg total) by nebulization every 6 (six) hours as needed for wheezing or shortness of breath.   amiodarone  (PACERONE ) 200 MG tablet Take 1 tablet (200 mg total) by mouth 2 (two) times daily for 7 days, THEN 1 tablet (200 mg total) daily for 7 days.   apixaban  (ELIQUIS ) 5 MG TABS tablet Take 1 tablet (5 mg total) by mouth 2 (two) times daily.   Apoaequorin (PREVAGEN) 10 MG CAPS Take 10 mg by mouth daily.   betamethasone , augmented, (DIPROLENE ) 0.05 % gel Apply topically daily.  Apply to scalp topically every 12 hours as needed for psoriasis related  to PSORIASIS, UNSPECIFIED (L40.9) apply to affected area of scalp   bisacodyl (DULCOLAX) 10 MG suppository Place 1 suppository (10 mg total) rectally daily as needed for moderate constipation.   brimonidine -timolol  (COMBIGAN ) 0.2-0.5 % ophthalmic solution Place 1 drop into the right eye every 12 (twelve) hours.   Calcium  Carbonate-Vit D-Min (CALCIUM  600+D PLUS MINERALS) 600-400 MG-UNIT TABS Take 1 tablet by mouth daily.   dextromethorphan -guaiFENesin  (MUCINEX  DM) 30-600 MG 12hr tablet Take 1 tablet by mouth 2 (two) times daily.   diclofenac  Sodium (VOLTAREN ) 1 % GEL Apply 1 g topically 2 (two) times daily as needed (to L foot bunion deformity for pain).   furosemide  (LASIX ) 20 MG tablet TAKE ONE TABLET BY MOUTH EVERY DAY   Ipratropium-Albuterol  (COMBIVENT) 20-100 MCG/ACT AERS respimat Inhale 2 puffs into the lungs every 6 (six) hours as needed for shortness of breath.   Iron, Ferrous Sulfate, 325 (65 Fe) MG TABS Take 325 mg by mouth daily.  levothyroxine  (SYNTHROID ) 100 MCG tablet TAKE ONE TABLET BY MOUTH EVERY DAY IN THE MORNING   lidocaine  (HM LIDOCAINE  PATCH) 4 % Place 1 patch onto the skin in the morning. Apply topically to L shoulder in the morning. Apply for 12 hours (0900) then removed for 12 hours (2100).   Multiple Vitamin (MULTIVITAMIN ADULT) TABS Take 1 tablet by mouth in the morning.   Multiple Vitamins-Minerals (OCUVITE PO) Take 1 capsule by mouth in the morning.   NAMENDA  10 MG tablet TAKE ONE TABLET BY MOUTH TWICE A DAY FOR COGNITIVE DECLINE   nystatin cream (MYCOSTATIN) Apply 1 Application topically every 12 (twelve) hours as needed (for rash).   ondansetron  (ZOFRAN ) 4 MG tablet Take 1 tablet (4 mg total) by mouth every 6 (six) hours as needed for nausea.   oxyCODONE  (ROXICODONE ) 5 MG immediate release tablet Take 1 tablet (5 mg total) by mouth every 6 (six) hours as needed for severe pain (pain score 7-10).   pantoprazole (PROTONIX) 40 MG tablet Take 40 mg by mouth daily as needed  (for indigestion).   Polyethyl Glyc-Propyl Glyc PF (SYSTANE HYDRATION PF) 0.4-0.3 % SOLN Place 1 drop into the right eye in the morning, at noon, in the evening, and at bedtime.   polyethylene glycol (MIRALAX  / GLYCOLAX ) 17 g packet Take 17 g by mouth every other day.   potassium chloride  (KLOR-CON  M) 10 MEQ tablet Take 1 tablet (10 mEq total) by mouth daily.   senna-docusate (SENOKOT-S) 8.6-50 MG tablet Take 1 tablet by mouth at bedtime as needed for mild constipation.   sennosides-docusate sodium  (SENOKOT-S) 8.6-50 MG tablet Take 1 tablet by mouth daily.   [DISCONTINUED] oxyCODONE  (ROXICODONE ) 5 MG immediate release tablet Take 1 tablet (5 mg total) by mouth every 4 (four) hours as needed for severe pain (pain score 7-10) or breakthrough pain.   [DISCONTINUED] oxyCODONE  (ROXICODONE ) 5 MG immediate release tablet Take 1 tablet (5 mg total) by mouth every 4 (four) hours as needed for severe pain (pain score 7-10) or breakthrough pain. (Patient not taking: Reported on 11/13/2023)   [DISCONTINUED] polyethylene glycol (MIRALAX  / GLYCOLAX ) 17 g packet Take 17 g by mouth daily. (Patient not taking: Reported on 11/13/2023)   No facility-administered encounter medications on file as of 11/13/2023.    Review of Systems  Constitutional:  Negative for appetite change, chills, fatigue and fever.  HENT:  Negative for congestion, hearing loss, rhinorrhea and sore throat.   Eyes: Negative.   Respiratory:  Negative for cough, shortness of breath and wheezing.   Cardiovascular:  Negative for chest pain, palpitations and leg swelling.  Gastrointestinal:  Negative for abdominal pain, constipation, diarrhea, nausea and vomiting.  Genitourinary:  Negative for dysuria.  Musculoskeletal:  Negative for arthralgias, back pain and myalgias.  Skin:  Negative for color change, rash and wound.  Neurological:  Negative for dizziness, weakness and headaches.  Psychiatric/Behavioral:  Negative for behavioral problems. The  patient is not nervous/anxious.     Immunization History  Administered Date(s) Administered   Fluad Quad(high Dose 65+) 04/02/2022   Fluzone Influenza virus vaccine,trivalent (IIV3), split virus 02/24/2019, 03/08/2020, 03/25/2021   Influenza Split 03/11/2009, 03/11/2010, 02/25/2011   Influenza, High Dose Seasonal PF 03/08/2015, 04/03/2017, 03/17/2018, 03/08/2020, 04/15/2023   Influenza,inj,Quad PF,6+ Mos 03/08/2016   Influenza,inj,quad, With Preservative 03/11/2017   Moderna Covid-19 Vaccine  Bivalent Booster 88yrs & up 10/27/2021, 07/11/2023   Moderna SARS-COV2 Booster Vaccination 11/08/2020   Moderna Sars-Covid-2 Vaccination 06/15/2019, 07/13/2019, 04/19/2020, 02/27/2022   PNEUMOCOCCAL CONJUGATE-20 06/27/2023  Pfizer Covid-19 Vaccine Bivalent Booster 41yrs & up 02/28/2021, 04/12/2022   Pneumococcal Conjugate-13 12/06/2014   Pneumococcal Polysaccharide-23 05/13/2001, 03/11/2009   Polio, Unspecified 02/02/1958   Tdap 11/21/2011, 04/11/2023   Zoster Recombinant(Shingrix) 07/18/2022, 10/19/2022   Pertinent  Health Maintenance Due  Topic Date Due   INFLUENZA VACCINE  01/10/2024   DEXA SCAN  Completed      02/16/2022    9:45 AM 02/27/2022   11:41 AM 07/06/2022   10:04 AM 07/13/2022    3:55 PM 03/25/2023    4:51 PM  Fall Risk  Falls in the past year?   0 0 1  Was there an injury with Fall?   0 0 0  Fall Risk Category Calculator   0 0 1  (RETIRED) Patient Fall Risk Level High fall risk High fall risk     Patient at Risk for Falls Due to  History of fall(s) No Fall Risks No Fall Risks History of fall(s);Impaired balance/gait;Impaired mobility  Fall risk Follow up  Falls evaluation completed  Falls evaluation completed Falls evaluation completed;Falls prevention discussed;Education provided   Functional Status Survey:   Vitals:   11/13/23 1031  BP: (!) 147/59  Pulse: 64  Resp: 17  Temp: 97.6 F (36.4 C)  SpO2: 95%  Weight: 196 lb 12.8 oz (89.3 kg)  Height: 5\' 3"  (1.6 m)    Body mass index is 34.86 kg/m. Physical Exam Constitutional:      General: She is not in acute distress.    Appearance: She is obese.  HENT:     Head: Normocephalic and atraumatic.     Nose: Nose normal.     Mouth/Throat:     Mouth: Mucous membranes are moist.  Eyes:     Conjunctiva/sclera: Conjunctivae normal.  Cardiovascular:     Rate and Rhythm: Normal rate and regular rhythm.  Pulmonary:     Effort: Pulmonary effort is normal.     Breath sounds: Normal breath sounds.  Abdominal:     General: Bowel sounds are normal.     Palpations: Abdomen is soft.  Musculoskeletal:     Cervical back: Normal range of motion.  Skin:    General: Skin is warm and dry.     Comments: Right hip dressing with moderate amount of serous drainage, no redness on surgical wound  Neurological:     Mental Status: She is alert.  Psychiatric:        Mood and Affect: Mood normal.        Behavior: Behavior normal.     Labs reviewed: Recent Labs    11/10/23 1820 11/11/23 0505 11/12/23 0608  NA 141 139 138  K 3.5 3.4 4.9  CL 99 101 102  CO2 30 31 31   GLUCOSE 123 121 105  BUN 26 27 20   CREATININE 0.84 0.68 0.70  CALCIUM  8.4 8.0 8.1  MG 2.1 2.1 2.2  PHOS 2.0 2.6 2.4   Recent Labs    11/10/23 1820 11/11/23 0505 11/12/23 0608  AST 31 30 32  ALT 17 16 18   ALKPHOS 39 36 38  BILITOT 0.8 1.3 1.7  PROT 5.5 5.1 5.2  ALBUMIN 2.7 2.6 2.6   Recent Labs    11/10/23 0605 11/10/23 1820 11/11/23 0505 11/12/23 0608  WBC 7.8  --  6.9 6.8  NEUTROABS 5.1  --  4.1 3.9  HGB 7.9 8.2 7.9 7.6  HCT 24.2 25.1 24.7 23.8  MCV 103.4  --  105.6 105.3  PLT 108  --  126  142    Lab Results  Component Value Date   TSH 4.206 11/07/2023   Lab Results  Component Value Date   HGBA1C 4.8 11/08/2023   Lab Results  Component Value Date   CHOL 159 09/14/2023   HDL 42 09/14/2023   LDLCALC 100 (H) 09/14/2023   TRIG 85 09/14/2023   CHOLHDL 3.8 09/14/2023    Significant Diagnostic Results in last 30  days:  DG FEMUR, MIN 2 VIEWS RIGHT Result Date: 11/07/2023 CLINICAL DATA:  Elective surgery. EXAM: RIGHT FEMUR 2 VIEWS COMPARISON:  Preoperative imaging FINDINGS: Nine fluoroscopic spot views of the right femur submitted from the operating room. Femoral intramedullary nail with trans trochanteric and distal locking screw fixation of proximal femur fracture. Fluoroscopy time 176.7 seconds. Dose 71.17 mGy. IMPRESSION: Intraoperative fluoroscopy during proximal femur fracture fixation. Electronically Signed   By: Chadwick Colonel M.D.   On: 11/07/2023 20:22   DG C-Arm 1-60 Min-No Report Result Date: 11/07/2023 Fluoroscopy was utilized by the requesting physician.  No radiographic interpretation.   DG C-Arm 1-60 Min-No Report Result Date: 11/07/2023 Fluoroscopy was utilized by the requesting physician.  No radiographic interpretation.   CT HEAD WO CONTRAST Result Date: 11/06/2023 CLINICAL DATA:  Head trauma, mechanical fall. EXAM: CT HEAD WITHOUT CONTRAST CT CERVICAL SPINE WITHOUT CONTRAST TECHNIQUE: Multidetector CT imaging of the head and cervical spine was performed following the standard protocol without intravenous contrast. Multiplanar CT image reconstructions of the cervical spine were also generated. RADIATION DOSE REDUCTION: This exam was performed according to the departmental dose-optimization program which includes automated exposure control, adjustment of the mA and/or kV according to patient size and/or use of iterative reconstruction technique. COMPARISON:  CT head 09/13/2023, CT cervical spine 09/25/2020. FINDINGS: CT HEAD FINDINGS Brain: No acute intracranial hemorrhage. No CT evidence of acute infarct. Nonspecific hypoattenuation in the periventricular and subcortical white matter favored to reflect chronic microvascular ischemic changes. Generalized parenchymal volume loss. No edema, mass effect, or midline shift. The basilar cisterns are patent. Ventricles: The ventricles are normal.  Vascular: Atherosclerotic calcifications of the carotid siphons. No hyperdense vessel. Skull: No acute or aggressive finding. Orbits: Orbits are symmetric. Sinuses: Complete opacification of the left maxillary sinus. Mild mucosal thickening in the ethmoid sinuses and right sphenoid sinus. Other: Mastoid air cells are clear. CT CERVICAL SPINE FINDINGS Alignment: Straightening of the normal cervical lordosis. No significant listhesis. No facet subluxation or dislocation. Skull base and vertebrae: No acute fracture. No primary bone lesion or focal pathologic process. Soft tissues and spinal canal: No prevertebral fluid or swelling. No visible canal hematoma. Disc levels: Intervertebral disc space narrowing at multiple levels most pronounced at C4-5. Disc osteophyte complexes at multiple levels. Disc bulge and thickening of the ligamentum flavum at C3-4 resulting in moderate spinal canal stenosis. Additional disc osteophyte complexes at C4-5 and C5-6 contributing to moderate spinal canal stenosis. Facet arthrosis and uncovertebral hypertrophy at multiple levels. Foraminal narrowing most pronounced on the left at C6-7. Upper chest: Negative. Other: None. IMPRESSION: No CT evidence of acute intracranial abnormality. No acute fracture or traumatic malalignment of the cervical spine. Degenerative changes as above. Moderate chronic microvascular ischemic changes and generalized parenchymal volume loss. Electronically Signed   By: Denny Flack M.D.   On: 11/06/2023 18:25   CT CERVICAL SPINE WO CONTRAST Result Date: 11/06/2023 CLINICAL DATA:  Head trauma, mechanical fall. EXAM: CT HEAD WITHOUT CONTRAST CT CERVICAL SPINE WITHOUT CONTRAST TECHNIQUE: Multidetector CT imaging of the head and cervical spine was performed following the  standard protocol without intravenous contrast. Multiplanar CT image reconstructions of the cervical spine were also generated. RADIATION DOSE REDUCTION: This exam was performed according to the  departmental dose-optimization program which includes automated exposure control, adjustment of the mA and/or kV according to patient size and/or use of iterative reconstruction technique. COMPARISON:  CT head 09/13/2023, CT cervical spine 09/25/2020. FINDINGS: CT HEAD FINDINGS Brain: No acute intracranial hemorrhage. No CT evidence of acute infarct. Nonspecific hypoattenuation in the periventricular and subcortical white matter favored to reflect chronic microvascular ischemic changes. Generalized parenchymal volume loss. No edema, mass effect, or midline shift. The basilar cisterns are patent. Ventricles: The ventricles are normal. Vascular: Atherosclerotic calcifications of the carotid siphons. No hyperdense vessel. Skull: No acute or aggressive finding. Orbits: Orbits are symmetric. Sinuses: Complete opacification of the left maxillary sinus. Mild mucosal thickening in the ethmoid sinuses and right sphenoid sinus. Other: Mastoid air cells are clear. CT CERVICAL SPINE FINDINGS Alignment: Straightening of the normal cervical lordosis. No significant listhesis. No facet subluxation or dislocation. Skull base and vertebrae: No acute fracture. No primary bone lesion or focal pathologic process. Soft tissues and spinal canal: No prevertebral fluid or swelling. No visible canal hematoma. Disc levels: Intervertebral disc space narrowing at multiple levels most pronounced at C4-5. Disc osteophyte complexes at multiple levels. Disc bulge and thickening of the ligamentum flavum at C3-4 resulting in moderate spinal canal stenosis. Additional disc osteophyte complexes at C4-5 and C5-6 contributing to moderate spinal canal stenosis. Facet arthrosis and uncovertebral hypertrophy at multiple levels. Foraminal narrowing most pronounced on the left at C6-7. Upper chest: Negative. Other: None. IMPRESSION: No CT evidence of acute intracranial abnormality. No acute fracture or traumatic malalignment of the cervical spine.  Degenerative changes as above. Moderate chronic microvascular ischemic changes and generalized parenchymal volume loss. Electronically Signed   By: Denny Flack M.D.   On: 11/06/2023 18:25   CT PELVIS WO CONTRAST Result Date: 11/06/2023 CLINICAL DATA:  Hip trauma, fracture suspected, xray done Right hip pain. EXAM: CT OF THE PELVIS WITHOUT CONTRAST TECHNIQUE: Multidetector CT imaging of the pelvis was performed according to the standard protocol. Multiplanar CT image reconstructions were also generated. RADIATION DOSE REDUCTION: This exam was performed according to the departmental dose-optimization program which includes automated exposure control, adjustment of the mA and/or kV according to patient size and/or use of iterative reconstruction technique. COMPARISON:  Radiograph earlier today FINDINGS: Bones/Joint/Cartilage Comminuted and displaced proximal femur fracture. There is a displaced component involving the lesser trochanter. There is a transverse subtrochanteric component. Fracture extends to involve the base of the femoral neck. No hip dislocation, the femoral head remains seated. Intact pubic rami. No sacral fracture. Minimal right hip joint effusion. Ligaments Suboptimally assessed by CT. Muscles and Tendons Mild edema in the skill a chair today shin to the fracture. No muscle atrophy. Soft tissues Colonic diverticulosis without diverticulitis. Stable 3.7 cm cystic structure in the left adnexa adjacent to the left aspect of the vaginal cuff from 2023 hip CT, likely benign given stability. There is a fat containing umbilical hernia. Aortic atherosclerosis. IMPRESSION: Comminuted and displaced proximal right femur fracture with a displaced component involving the lesser trochanter. There is a transverse subtrochanteric component. Fracture extends to involve the base of the femoral neck. Aortic Atherosclerosis (ICD10-I70.0). Electronically Signed   By: Chadwick Colonel M.D.   On: 11/06/2023 18:19    DG Hip Unilat With Pelvis 2-3 Views Right Result Date: 11/06/2023 CLINICAL DATA:  fall EXAM: DG HIP (WITH OR WITHOUT  PELVIS) 2-3V RIGHT COMPARISON:  None Available. FINDINGS: Acute comminuted and markedly displaced right intertrochanteric femoral fracture. No right hip dislocation. Query slight irregularity of the left femoral neck. Poorly visualized left hip- limited evaluation due to overlapping osseous structures and overlying soft tissues. view. No acute displaced fracture or diastasis of the bones of the pelvis. No acute displaced fractured diastasis of the bones of the pelvis. There is no evidence of severe arthropathy or other focal bone abnormality. IMPRESSION: Acute comminuted and markedly displaced right intertrochanteric femoral fracture. Poorly visualized left hip- limited evaluation due to overlapping osseous structures and overlying soft tissues. Query slight irregularity of the left femoral neck. Recommend dedicated three view radiograph of the left hip for further evaluation. Electronically Signed   By: Morgane  Naveau M.D.   On: 11/06/2023 17:48   DG Chest Port 1 View Result Date: 11/06/2023 CLINICAL DATA:  fall EXAM: PORTABLE CHEST 1 VIEW COMPARISON:  Chest x-Burmester 09/13/2023 FINDINGS: Left chest wall dual lead pacemaker. The heart and mediastinal contours are unchanged. Atherosclerotic plaque. Left base atelectasis. No focal consolidation. Chronic coarsened interstitial markings with no overt pulmonary edema. No pleural effusion. No pneumothorax. No acute osseous abnormality. IMPRESSION: 1. No active disease. 2.  Aortic Atherosclerosis (ICD10-I70.0). Electronically Signed   By: Morgane  Naveau M.D.   On: 11/06/2023 17:43    Assessment/Plan  1. Closed displaced intertrochanteric fracture of right femur, sequela (Primary) -   S/P injury intramedullary nailing of left femur on 11/07/2023 - WBAT -Continue apixaban  5 mg twice a day - decrease oxycodone  5 mg every 4 hours PRN to oxycodone   5 mg 1 tab p.o. every 6 hours as needed for pain -   For PT and OT, for therapeutic strengthening exercises -   Fall precautions -   Follow-up with orthopedics  2. Acute on chronic diastolic heart failure (HCC) -   Recent TTE done on 09/14/2023 showed EF > 75% -   She was given IV Lasix  x 2 and resumed on furosemide  20 mg daily -   Continue potassium supplementation, KCl 10 mEq daily -   Patient has been taking Lasix  and denies itching, discontinued Lasix  as an allergy    3. Hypokalemia Lab Results  Component Value Date   K 4.9 11/12/2023    -   Continue KCl 10 mEq daily -   CMP in 1 week  4. Glaucoma of right eye, unspecified glaucoma type -   Denies eye pain -   Continue brimonidine  0.2% ophthalmic solution to right eye every 12 hours, timolol  0.5% ophthalmic solution 1 drop to right eye every 12 hours and  up Systane 1 drop into the right eye in the morning, noon, evening and at bedtime  5. Persistent atrial fibrillation (HCC) -   She had an episode of atrial fibrillation with RVR on 11/10/2023 and was given 1 dose of IV metoprolol  without much effect.  She was placed on amiodarone  drip and bolus and initiating apixaban .  Heart rate improved and weaned off amiodarone  drip per cardiology consultation and recommended transitioning to amiodarone  200 mg twice a day x 7 days then back to 200 mg daily -   Follow-up with cardiology  6. Hypothyroidism, unspecified type Lab Results  Component Value Date   TSH 4.206 11/07/2023    -   Continue levothyroxine  100 mcg daily  7. Acute blood loss anemia Lab Results  Component Value Date   HGB 7.6 (L) 11/12/2023    - Iron level 28, UIBC 302, TIBC  330, ferritin 68, folate 24.2 and vitamin B12 340 - Will start on ferrous sulfate 325 mg 1 tab daily   8.  Slow transit constipation - Decreased MiraLAX  17 g p.o. q. other day and start on senna S8 0.6/50 mg 1 tab daily -   Continue senna S8 0.6/50 mg 1 tab p.o. nightly as needed    Family/  staff Communication: Discussed plan of care with resident, daughter and charge nurse.  Labs/tests ordered: for CBC, CMP, magnesium, Phos in 1 week.

## 2023-11-14 ENCOUNTER — Non-Acute Institutional Stay (SKILLED_NURSING_FACILITY): Payer: Self-pay | Admitting: Sports Medicine

## 2023-11-14 DIAGNOSIS — I480 Paroxysmal atrial fibrillation: Secondary | ICD-10-CM

## 2023-11-14 DIAGNOSIS — E039 Hypothyroidism, unspecified: Secondary | ICD-10-CM | POA: Diagnosis not present

## 2023-11-14 DIAGNOSIS — N1831 Chronic kidney disease, stage 3a: Secondary | ICD-10-CM

## 2023-11-14 DIAGNOSIS — S72001D Fracture of unspecified part of neck of right femur, subsequent encounter for closed fracture with routine healing: Secondary | ICD-10-CM | POA: Diagnosis not present

## 2023-11-14 DIAGNOSIS — I509 Heart failure, unspecified: Secondary | ICD-10-CM | POA: Diagnosis not present

## 2023-11-14 DIAGNOSIS — K5900 Constipation, unspecified: Secondary | ICD-10-CM | POA: Diagnosis not present

## 2023-11-14 NOTE — Progress Notes (Unsigned)
 Provider:  Dr. Tye Gall Location:  [facility] Place of Service:   Skilled care Nursing   PCP: Tye Gall, MD Patient Care Team: Tye Gall, MD as PCP - General (Internal Medicine) Jolly Needle, MD (Inactive) as PCP - Electrophysiology (Cardiology) Adair Actis, FNP (Hospice and Palliative Medicine)  Extended Emergency Contact Information Primary Emergency Contact: [facility] Phone: [phone] Mobile Phone: [phone] Relation: Daughter Interpreter needed? No Secondary Emergency Contact: Pearman,Karen Address: [address]          [place], [place] [address] [place] Home Phone: [phone] Mobile Phone: [phone] Relation: Daughter  Goals of Care: Advanced Directive information    11/07/2023    3:55 PM  Advanced Directives  Does Patient Have a Medical Advance Directive? Yes  Type of Estate agent of [place];Living will  Copy of Healthcare Power of Attorney in Chart? Yes - validated most recent copy scanned in chart (See row information)      No chief complaint on file.      History of Present Illness  89 yr old F with h/o Dementia, Rt hip Fracture, OA, CHF,  CKD is evaluated for admission to skilled care after recent hospitalization for Rt hip fracture. Pt seen and examined in her room She is laying on her bed,  c/o pain in her Rt hip  She depends on hoyer lift for transferring  Pt knows her name, not oriented to time or place Can remember what she had for breakfast  Denies chest pain, palpitations, SOB, abdominal pain, nausea, vomiting  Denies dysuria, hematuria As per nursing staff  daughter visited patient today and noticed increased confusion and wants to have her urine checked.  As per d/c summary   Recommendations at discharge:    Follow-up with PCP within 1 to 2 weeks repeat CBC, CMP, mag, Phos within 1 week Follow-up with Cardiology in outpatient  setting within 1 to 2 weeks and continue amiodarone  200 mg p.o. twice daily for 7 days and then just 200 mL p.o. daily Follow-up with Orthopedic Surgery Dr. Hermina Loosen within 1 to 2 weeks  Hospital Course: Patient is a 89 year old elderly Caucasian female with a past medical history significant for A-fib on anticoagulation, chronic diastolic CHF, history of aortic stenosis, history of tachybradycardia syndrome and sick sinus dysfunction status post pacemaker, GERD, Sendil dementia, essential hypertension, hypothyroidism as well as other comorbid's who presented from her ALF after a fall.  Patient was ambulating and leaving her bathroom and she tripped on the edge of the rug and fell on her right side.  Further workup was done including a trauma scan and CT pelvis showed an acute comminuted and markedly displaced right intratrochanteric femur fracture.  Orthopedic surgery was consulted and her anticoagulation was held and plan is for surgical intervention with intramedullary nail of the femur. She is POD 4 and now position has been complicated by an AKI, acute blood loss anemia as well acute respiratory failure with hypoxia and acute on chronic diastolic CHF.   She has now been initiated on diuresis and IV fluid hydration has now been discontinued given that her AKI is improved.  Blood count continues to drop slowly and now she went in A-fib with RVR.  She was given IV Metoprolol  but heart rate continued to be sustained so we will transfer to the cardiac unit and placed on a amiodarone  drip and bolus for now.  Rates have improved and now will wean her back off of the amiodarone   drip and placed her on p.o. amiodarone  at a higher dose.   Hemoglobin remains relatively stable and will wean her off of supplemental oxygen via nasal cannula. She is fatigued and a little lethargic but stable and improved from the time of Admission. She is medically stable to D/C to SNF and will need to follow up with PCP, Cardiology,  and Orthopedic Surgery in the outpatient setting within 1-2 weeks.      Past Medical History:  Diagnosis Date   Aortic stenosis 03/08/2016   Brady-tachy syndrome (HCC)    a. Biotronik dual chamber PPM implanted 2009 b. gen change to STJ dual chamber PPM 2017   Ejection fraction    EF 70%, echo, October, 2009  //   EF 55-60%, septal dyssynergy consistent with a paced rhythm, mild to moderate mitral regurgitation, echo, November, 2015    GERD (gastroesophageal reflux disease) 04/28/2014   Episodes November, 2015 with fluid refluxing from her esophagus.    Hair loss    Patient questioned Coumadin, changed toPradaxa   Hypertension    Hypothyroidism    Lower extremity edema 01/22/2018   Mitral regurgitation 05/03/2014   Mild-to-moderate mitral regurgitation, echo, November, 2015    Osteoarthritis of left knee 10/25/2017   Paroxysmal atrial fibrillation (HCC)    Episodes rapid atrial fib noted by pacemaker interrogation, August, 2011, diltiazem  added, patient improved. Patient continues on Rythmol   //   Changed to flecainide  2013  //   flecainide  level checked in 2013, good level    Persistent atrial fibrillation (HCC)    Presence of permanent cardiac pacemaker    Past Surgical History:  Procedure Laterality Date   ABDOMINAL HYSTERECTOMY     EP IMPLANTABLE DEVICE N/A 08/08/2015   Generator change with SJM Assurity DR PPM by Dr Nunzio Belch   FEMUR IM NAIL Right 11/07/2023   Procedure: INSERTION, INTRAMEDULLARY ROD, FEMUR, RETROGRADE;  Surgeon: Wilhelmenia Harada, MD;  Location: [facility] OR;  Service: Orthopedics;  Laterality: Right;   JOINT REPLACEMENT     PACEMAKER PLACEMENT  2009        reports that she quit smoking about 33 years ago. Her smoking use included cigarettes. She has never used smokeless tobacco. She reports that she does not drink alcohol  and does not use drugs. Social History   Socioeconomic History   Marital status: Widowed    Spouse name: Not on file   Number of children: Not  on file   Years of education: Not on file   Highest education level: Not on file  Occupational History   Not on file  Tobacco Use   Smoking status: Former    Current packs/day: 0.00    Types: Cigarettes    Quit date: 09/06/1990    Years since quitting: 33.2   Smokeless tobacco: Never  Vaping Use   Vaping status: Never Used  Substance and Sexual Activity   Alcohol  use: No   Drug use: No   Sexual activity: Not on file  Other Topics Concern   Not on file  Social History Narrative   Not on file   Social Drivers of Health   Financial Resource Strain: Not on file  Food Insecurity: No Food Insecurity (11/06/2023)   Hunger Vital Sign    Worried About Running Out of Food in the Last Year: Never true    Ran Out of Food in the Last Year: Never true  Transportation Needs: No Transportation Needs (11/06/2023)   PRAPARE - Transportation    Lack of Transportation (  Medical): No    Lack of Transportation (Non-Medical): No  Physical Activity: Not on file  Stress: Not on file  Social Connections: Moderately Integrated (11/06/2023)   Social Connection and Isolation Panel [NHANES]    Frequency of Communication with Friends and Family: Twice a week    Frequency of Social Gatherings with Friends and Family: Three times a week    Attends Religious Services: 1 to 4 times per year    Active Member of Clubs or Organizations: No    Attends Engineer, structural: More than 4 times per year    Marital Status: Widowed  Intimate Partner Violence: Not At Risk (11/06/2023)   Humiliation, Afraid, Rape, and Kick questionnaire    Fear of Current or Ex-Partner: No    Emotionally Abused: No    Physically Abused: No    Sexually Abused: No    Functional Status Survey:    Family History  Problem Relation Age of Onset   Heart disease Father    Coronary artery disease Other    Heart disease Son     Health Maintenance  Topic Date Due   COVID-19 Vaccine (3 - Pfizer risk series) 08/08/2023    INFLUENZA VACCINE  01/10/2024   Medicare Annual Wellness (AWV)  04/08/2024   DTaP/Tdap/Td (3 - Td or Tdap) 04/10/2033   Pneumonia Vaccine 83+ Years old  Completed   DEXA SCAN  Completed   Zoster Vaccines- Shingrix  Completed   HPV VACCINES  Aged Out   Meningococcal B Vaccine  Aged Out    Allergies  Allergen Reactions   Morphine And Codeine Other (See Comments)    Patient states that she went "crazy" with this   Penicillins Swelling and Rash        Aspirin      Pacemaker   Clindamycin/Lincomycin Other (See Comments)    Any meds with mycin in the name Unknown reaction Not listed on the [facility]   Crestor  [Rosuvastatin ]     Other reaction(s): muscle pain Not listed on the [facility]   Diltiazem      Skin discoloration, lower extremity swelling Not listed on the [facility]   Erythromycin Other (See Comments)    Any meds with mycin in the name Unknown reaction Not listed on the [facility]   Lisinopril Other (See Comments)    REACTION: Cough   Metronidazole     Other reaction(s): mouth sores, tongue swelling   Morphine    Penicillins Cross Reactors Other (See Comments)    Any meds with mycin in the name Unknown reaction    Outpatient Encounter Medications as of 11/14/2023  Medication Sig   acetaminophen  (TYLENOL ) 500 MG tablet Take 2 tablets (1,000 mg total) by mouth every 8 (eight) hours as needed for moderate pain (pain score 4-6).   albuterol  (PROVENTIL ) (2.5 MG/3ML) 0.083% nebulizer solution Take 3 mLs (2.5 mg total) by nebulization every 6 (six) hours as needed for wheezing or shortness of breath.   amiodarone  (PACERONE ) 200 MG tablet Take 1 tablet (200 mg total) by mouth 2 (two) times daily for 7 days, THEN 1 tablet (200 mg total) daily for 7 days.   apixaban  (ELIQUIS ) 5 MG TABS tablet Take 1 tablet (5 mg total) by mouth 2 (two) times daily.   Apoaequorin (PREVAGEN) 10 MG CAPS Take 10 mg by mouth daily.   betamethasone , augmented, (DIPROLENE ) 0.05 % gel Apply topically daily.  Apply to scalp  topically every 12 hours as needed for psoriasis related to PSORIASIS, UNSPECIFIED (L40.9) apply  to affected area of scalp   bisacodyl (DULCOLAX) 10 MG suppository Place 1 suppository (10 mg total) rectally daily as needed for moderate constipation.   brimonidine -timolol  (COMBIGAN ) 0.2-0.5 % ophthalmic solution Place 1 drop into the right eye every 12 (twelve) hours.   Calcium  Carbonate-Vit D-Min (CALCIUM  600+D PLUS MINERALS) 600-400 MG-UNIT TABS Take 1 tablet by mouth daily.   dextromethorphan -guaiFENesin  (MUCINEX  DM) 30-600 MG 12hr tablet Take 1 tablet by mouth 2 (two) times daily.   diclofenac  Sodium (VOLTAREN ) 1 % GEL Apply 1 g topically 2 (two) times daily as needed (to L foot bunion deformity for pain).   furosemide  (LASIX ) 20 MG tablet TAKE ONE TABLET BY MOUTH EVERY DAY   Ipratropium-Albuterol  (COMBIVENT) 20-100 MCG/ACT AERS respimat Inhale 2 puffs into the lungs every 6 (six) hours as needed for shortness of breath.   Iron, Ferrous Sulfate, 325 (65 Fe) MG TABS Take 325 mg by mouth daily.   levothyroxine  (SYNTHROID ) 100 MCG tablet TAKE ONE TABLET BY MOUTH EVERY DAY IN THE MORNING   lidocaine  (HM LIDOCAINE  PATCH) 4 % Place 1 patch onto the skin in the morning. Apply topically to L shoulder in the morning. Apply for 12 hours (0900) then removed for 12 hours (2100).   Multiple Vitamin (MULTIVITAMIN ADULT) TABS Take 1 tablet by mouth in the morning.   Multiple Vitamins-Minerals (OCUVITE PO) Take 1 capsule by mouth in the morning.   NAMENDA  10 MG tablet TAKE ONE TABLET BY MOUTH TWICE A DAY FOR COGNITIVE DECLINE   nystatin cream (MYCOSTATIN) Apply 1 Application topically every 12 (twelve) hours as needed (for rash).   ondansetron  (ZOFRAN ) 4 MG tablet Take 1 tablet (4 mg total) by mouth every 6 (six) hours as needed for nausea.   oxyCODONE  (ROXICODONE ) 5 MG immediate release tablet Take 1 tablet (5 mg total) by mouth every 6 (six) hours as needed for severe pain (pain score 7-10).   pantoprazole  (PROTONIX) 40 MG tablet Take 40 mg by mouth daily as needed (for indigestion).   Polyethyl Glyc-Propyl Glyc PF (SYSTANE HYDRATION PF) 0.4-0.3 % SOLN Place 1 drop into the right eye in the morning, at noon, in the evening, and at bedtime.   polyethylene glycol (MIRALAX  / GLYCOLAX ) 17 g packet Take 17 g by mouth every other day.   potassium chloride  (KLOR-CON  M) 10 MEQ tablet Take 1 tablet (10 mEq total) by mouth daily.   senna-docusate (SENOKOT-S) 8.6-50 MG tablet Take 1 tablet by mouth at bedtime as needed for mild constipation.   sennosides-docusate sodium  (SENOKOT-S) 8.6-50 MG tablet Take 1 tablet by mouth daily.   No facility-administered encounter medications on file as of 11/14/2023.    Review of Systems  Constitutional:  Negative for chills and fever.  Respiratory:  Negative for cough, shortness of breath and wheezing.   Cardiovascular:  Positive for leg swelling. Negative for chest pain and palpitations.  Gastrointestinal:  Negative for abdominal distention, abdominal pain, blood in stool, constipation, diarrhea, nausea and vomiting.  Genitourinary:  Negative for dysuria.  Musculoskeletal:  Positive for arthralgias.  Neurological:  Negative for dizziness.   Negative unless indicated in HPI.  There were no vitals filed for this visit. There is no height or weight on file to calculate BMI. BP Readings from Last 3 Encounters:  11/13/23 (!) 147/59  11/12/23 (!) 114/38  10/03/23 132/72   Wt Readings from Last 3 Encounters:  11/13/23 196 lb 12.8 oz (89.3 kg)  11/10/23 212 lb 12.8 oz (96.5 kg)  10/03/23 195  lb (88.5 kg)   Physical Exam Constitutional:      Appearance: Normal appearance.  HENT:     Head: Normocephalic and atraumatic.  Cardiovascular:     Rate and Rhythm: Normal rate and regular rhythm.  Pulmonary:     Effort: Pulmonary effort is normal. No respiratory distress.     Breath sounds: Normal breath sounds. No wheezing.  Abdominal:     General: Bowel sounds are  normal. There is no distension.     Tenderness: There is no abdominal tenderness. There is no guarding.     Comments:    Musculoskeletal:        General: Swelling present.     Comments: Rt hip - Incision - staples in place No gaping, no drainage  Rt leg swollen +   Neurological:     Mental Status: She is alert. Mental status is at baseline.     Motor: No weakness.     Labs reviewed: Basic Metabolic Panel: Recent Labs    11/10/23 1820 11/11/23 0505 11/12/23 0608  NA 141 139 138  K 3.5 3.4* 4.9  CL 99 101 102  CO2 30 31 31   GLUCOSE 123* 121* 105*  BUN 26* 27* 20  CREATININE 0.84 0.68 0.70  CALCIUM  8.4* 8.0* 8.1*  MG 2.1 2.1 2.2  PHOS 2.0* 2.6 2.4*   Liver Function Tests: Recent Labs    11/10/23 1820 11/11/23 0505 11/12/23 0608  AST 31 30 32  ALT 17 16 18   ALKPHOS 39 36* 38  BILITOT 0.8 1.3* 1.7*  PROT 5.5* 5.1* 5.2*  ALBUMIN 2.7* 2.6* 2.6*   No results for input(s): "LIPASE", "AMYLASE" in the last 8760 hours. No results for input(s): "AMMONIA" in the last 8760 hours. CBC: Recent Labs    11/10/23 0605 11/10/23 1820 11/11/23 0505 11/12/23 0608  WBC 7.8  --  6.9 6.8  NEUTROABS 5.1  --  4.1 3.9  HGB 7.9* 8.2* 7.9* 7.6*  HCT 24.2* 25.1* 24.7* 23.8*  MCV 103.4*  --  105.6* 105.3*  PLT 108*  --  126* 142*   Cardiac Enzymes: No results for input(s): "CKTOTAL", "CKMB", "CKMBINDEX", "TROPONINI" in the last 8760 hours. BNP: Invalid input(s): "POCBNP" Lab Results  Component Value Date   HGBA1C 4.8 11/08/2023   Lab Results  Component Value Date   TSH 4.206 11/07/2023   Lab Results  Component Value Date   VITAMINB12 340 11/10/2023   Lab Results  Component Value Date   FOLATE 24.2 11/10/2023   Lab Results  Component Value Date   IRON 28 11/10/2023   TIBC 330 11/10/2023   FERRITIN 68 11/10/2023    Imaging and Procedures obtained prior to SNF admission: DG FEMUR, MIN 2 VIEWS RIGHT Result Date: 11/07/2023 CLINICAL DATA:  Elective surgery. EXAM:  RIGHT FEMUR 2 VIEWS COMPARISON:  Preoperative imaging FINDINGS: Nine fluoroscopic spot views of the right femur submitted from the operating room. Femoral intramedullary nail with trans trochanteric and distal locking screw fixation of proximal femur fracture. Fluoroscopy time 176.7 seconds. Dose 71.17 mGy. IMPRESSION: Intraoperative fluoroscopy during proximal femur fracture fixation. Electronically Signed   By: Chadwick Colonel M.D.   On: 11/07/2023 20:22   DG C-Arm 1-60 Min-No Report Result Date: 11/07/2023 Fluoroscopy was utilized by the requesting physician.  No radiographic interpretation.   DG C-Arm 1-60 Min-No Report Result Date: 11/07/2023 Fluoroscopy was utilized by the requesting physician.  No radiographic interpretation.   CT HEAD WO CONTRAST Result Date: 11/06/2023 CLINICAL DATA:  Head trauma,  mechanical fall. EXAM: CT HEAD WITHOUT CONTRAST CT CERVICAL SPINE WITHOUT CONTRAST TECHNIQUE: Multidetector CT imaging of the head and cervical spine was performed following the standard protocol without intravenous contrast. Multiplanar CT image reconstructions of the cervical spine were also generated. RADIATION DOSE REDUCTION: This exam was performed according to the departmental dose-optimization program which includes automated exposure control, adjustment of the mA and/or kV according to patient size and/or use of iterative reconstruction technique. COMPARISON:  CT head 09/13/2023, CT cervical spine 09/25/2020. FINDINGS: CT HEAD FINDINGS Brain: No acute intracranial hemorrhage. No CT evidence of acute infarct. Nonspecific hypoattenuation in the periventricular and subcortical white matter favored to reflect chronic microvascular ischemic changes. Generalized parenchymal volume loss. No edema, mass effect, or midline shift. The basilar cisterns are patent. Ventricles: The ventricles are normal. Vascular: Atherosclerotic calcifications of the carotid siphons. No hyperdense vessel. Skull: No acute or  aggressive finding. Orbits: Orbits are symmetric. Sinuses: Complete opacification of the left maxillary sinus. Mild mucosal thickening in the ethmoid sinuses and right sphenoid sinus. Other: Mastoid air cells are clear. CT CERVICAL SPINE FINDINGS Alignment: Straightening of the normal cervical lordosis. No significant listhesis. No facet subluxation or dislocation. Skull base and vertebrae: No acute fracture. No primary bone lesion or focal pathologic process. Soft tissues and spinal canal: No prevertebral fluid or swelling. No visible canal hematoma. Disc levels: Intervertebral disc space narrowing at multiple levels most pronounced at C4-5. Disc osteophyte complexes at multiple levels. Disc bulge and thickening of the ligamentum flavum at C3-4 resulting in moderate spinal canal stenosis. Additional disc osteophyte complexes at C4-5 and C5-6 contributing to moderate spinal canal stenosis. Facet arthrosis and uncovertebral hypertrophy at multiple levels. Foraminal narrowing most pronounced on the left at C6-7. Upper chest: Negative. Other: None. IMPRESSION: No CT evidence of acute intracranial abnormality. No acute fracture or traumatic malalignment of the cervical spine. Degenerative changes as above. Moderate chronic microvascular ischemic changes and generalized parenchymal volume loss. Electronically Signed   By: Denny Flack M.D.   On: 11/06/2023 18:25   CT CERVICAL SPINE WO CONTRAST Result Date: 11/06/2023 CLINICAL DATA:  Head trauma, mechanical fall. EXAM: CT HEAD WITHOUT CONTRAST CT CERVICAL SPINE WITHOUT CONTRAST TECHNIQUE: Multidetector CT imaging of the head and cervical spine was performed following the standard protocol without intravenous contrast. Multiplanar CT image reconstructions of the cervical spine were also generated. RADIATION DOSE REDUCTION: This exam was performed according to the departmental dose-optimization program which includes automated exposure control, adjustment of the mA  and/or kV according to patient size and/or use of iterative reconstruction technique. COMPARISON:  CT head 09/13/2023, CT cervical spine 09/25/2020. FINDINGS: CT HEAD FINDINGS Brain: No acute intracranial hemorrhage. No CT evidence of acute infarct. Nonspecific hypoattenuation in the periventricular and subcortical white matter favored to reflect chronic microvascular ischemic changes. Generalized parenchymal volume loss. No edema, mass effect, or midline shift. The basilar cisterns are patent. Ventricles: The ventricles are normal. Vascular: Atherosclerotic calcifications of the carotid siphons. No hyperdense vessel. Skull: No acute or aggressive finding. Orbits: Orbits are symmetric. Sinuses: Complete opacification of the left maxillary sinus. Mild mucosal thickening in the ethmoid sinuses and right sphenoid sinus. Other: Mastoid air cells are clear. CT CERVICAL SPINE FINDINGS Alignment: Straightening of the normal cervical lordosis. No significant listhesis. No facet subluxation or dislocation. Skull base and vertebrae: No acute fracture. No primary bone lesion or focal pathologic process. Soft tissues and spinal canal: No prevertebral fluid or swelling. No visible canal hematoma. Disc levels: Intervertebral disc space narrowing  at multiple levels most pronounced at C4-5. Disc osteophyte complexes at multiple levels. Disc bulge and thickening of the ligamentum flavum at C3-4 resulting in moderate spinal canal stenosis. Additional disc osteophyte complexes at C4-5 and C5-6 contributing to moderate spinal canal stenosis. Facet arthrosis and uncovertebral hypertrophy at multiple levels. Foraminal narrowing most pronounced on the left at C6-7. Upper chest: Negative. Other: None. IMPRESSION: No CT evidence of acute intracranial abnormality. No acute fracture or traumatic malalignment of the cervical spine. Degenerative changes as above. Moderate chronic microvascular ischemic changes and generalized parenchymal volume  loss. Electronically Signed   By: Denny Flack M.D.   On: 11/06/2023 18:25   CT PELVIS WO CONTRAST Result Date: 11/06/2023 CLINICAL DATA:  Hip trauma, fracture suspected, xray done Right hip pain. EXAM: CT OF THE PELVIS WITHOUT CONTRAST TECHNIQUE: Multidetector CT imaging of the pelvis was performed according to the standard protocol. Multiplanar CT image reconstructions were also generated. RADIATION DOSE REDUCTION: This exam was performed according to the departmental dose-optimization program which includes automated exposure control, adjustment of the mA and/or kV according to patient size and/or use of iterative reconstruction technique. COMPARISON:  Radiograph earlier today FINDINGS: Bones/Joint/Cartilage Comminuted and displaced proximal femur fracture. There is a displaced component involving the lesser trochanter. There is a transverse subtrochanteric component. Fracture extends to involve the base of the femoral neck. No hip dislocation, the femoral head remains seated. Intact pubic rami. No sacral fracture. Minimal right hip joint effusion. Ligaments Suboptimally assessed by CT. Muscles and Tendons Mild edema in the skill a chair today shin to the fracture. No muscle atrophy. Soft tissues Colonic diverticulosis without diverticulitis. Stable 3.7 cm cystic structure in the left adnexa adjacent to the left aspect of the vaginal cuff from 2023 hip CT, likely benign given stability. There is a fat containing umbilical hernia. Aortic atherosclerosis. IMPRESSION: Comminuted and displaced proximal right femur fracture with a displaced component involving the lesser trochanter. There is a transverse subtrochanteric component. Fracture extends to involve the base of the femoral neck. Aortic Atherosclerosis (ICD10-I70.0). Electronically Signed   By: Chadwick Colonel M.D.   On: 11/06/2023 18:19   DG Hip Unilat With Pelvis 2-3 Views Right Result Date: 11/06/2023 CLINICAL DATA:  fall EXAM: DG HIP (WITH OR  WITHOUT PELVIS) 2-3V RIGHT COMPARISON:  None Available. FINDINGS: Acute comminuted and markedly displaced right intertrochanteric femoral fracture. No right hip dislocation. Query slight irregularity of the left femoral neck. Poorly visualized left hip- limited evaluation due to overlapping osseous structures and overlying soft tissues. view. No acute displaced fracture or diastasis of the bones of the pelvis. No acute displaced fractured diastasis of the bones of the pelvis. There is no evidence of severe arthropathy or other focal bone abnormality. IMPRESSION: Acute comminuted and markedly displaced right intertrochanteric femoral fracture. Poorly visualized left hip- limited evaluation due to overlapping osseous structures and overlying soft tissues. Query slight irregularity of the left femoral neck. Recommend dedicated three view radiograph of the left hip for further evaluation. Electronically Signed   By: Morgane  Naveau M.D.   On: 11/06/2023 17:48   DG Chest Port 1 View Result Date: 11/06/2023 CLINICAL DATA:  fall EXAM: PORTABLE CHEST 1 VIEW COMPARISON:  Chest x-Netto 09/13/2023 FINDINGS: Left chest wall dual lead pacemaker. The heart and mediastinal contours are unchanged. Atherosclerotic plaque. Left base atelectasis. No focal consolidation. Chronic coarsened interstitial markings with no overt pulmonary edema. No pleural effusion. No pneumothorax. No acute osseous abnormality. IMPRESSION: 1. No active disease. 2.  Aortic  Atherosclerosis (ICD10-I70.0). Electronically Signed   By: Morgane  Naveau M.D.   On: 11/06/2023 17:43    Assessment and Plan Assessment & Plan  Rt Hip Intertrochanteric fracture  S/p intra medullary nailing of femur on 11/07/23  Pt c/o pain in her Rt hip  O/E - staples in place, no gaping , no drainage noted  Will schedule tylenol  to 1000 mg tid Will change oxycodone  to 5 mg bid  Cont with oxycodone  5 mg q8 prn  Will cont PT/OT    Acute on chronic diastolic heart failure   Pt does not appear to be in distress Able to speak in full sentences O2 sat  Pt is on RA  Cont with lasix    Paroxysmal A fib  On amiodarone , eliquis  Will check labs next week  Pt has cardiology appt next week   Hypothyroidism  Cont with levothyroxine   Dementia Daughter is concerned that pt is confused  As per nursing staff no new confusion  Monitor vitals every shift  Will check UA and culture    AKI on CKD Avoid nephrotoxic med Labs ordered for next week   Constipation  Cont with bowel regimen    50 min Total time spent for obtaining history,  performing a medically appropriate examination and evaluation, reviewing the tests,ordering  tests,  documenting clinical information in the electronic or other health record,  care coordination (not separately reported)

## 2023-11-15 ENCOUNTER — Encounter: Payer: Self-pay | Admitting: Sports Medicine

## 2023-11-19 ENCOUNTER — Encounter: Payer: Self-pay | Admitting: Nurse Practitioner

## 2023-11-19 ENCOUNTER — Non-Acute Institutional Stay (SKILLED_NURSING_FACILITY): Payer: Self-pay | Admitting: Nurse Practitioner

## 2023-11-19 DIAGNOSIS — S81819A Laceration without foreign body, unspecified lower leg, initial encounter: Secondary | ICD-10-CM | POA: Insufficient documentation

## 2023-11-19 DIAGNOSIS — D509 Iron deficiency anemia, unspecified: Secondary | ICD-10-CM | POA: Insufficient documentation

## 2023-11-19 DIAGNOSIS — K5901 Slow transit constipation: Secondary | ICD-10-CM | POA: Diagnosis not present

## 2023-11-19 DIAGNOSIS — M25551 Pain in right hip: Secondary | ICD-10-CM | POA: Diagnosis not present

## 2023-11-19 DIAGNOSIS — K219 Gastro-esophageal reflux disease without esophagitis: Secondary | ICD-10-CM | POA: Diagnosis not present

## 2023-11-19 DIAGNOSIS — Z950 Presence of cardiac pacemaker: Secondary | ICD-10-CM

## 2023-11-19 DIAGNOSIS — I10 Essential (primary) hypertension: Secondary | ICD-10-CM | POA: Diagnosis not present

## 2023-11-19 DIAGNOSIS — I4819 Other persistent atrial fibrillation: Secondary | ICD-10-CM

## 2023-11-19 DIAGNOSIS — E039 Hypothyroidism, unspecified: Secondary | ICD-10-CM | POA: Diagnosis not present

## 2023-11-19 DIAGNOSIS — N1831 Chronic kidney disease, stage 3a: Secondary | ICD-10-CM

## 2023-11-19 DIAGNOSIS — I509 Heart failure, unspecified: Secondary | ICD-10-CM | POA: Diagnosis not present

## 2023-11-19 DIAGNOSIS — F0390 Unspecified dementia without behavioral disturbance: Secondary | ICD-10-CM | POA: Diagnosis not present

## 2023-11-19 LAB — BASIC METABOLIC PANEL WITH GFR
BUN: 18 (ref 4–21)
CO2: 28 — AB (ref 13–22)
Chloride: 102 (ref 99–108)
Creatinine: 0.8 (ref 0.5–1.1)
Glucose: 83
Potassium: 4 meq/L (ref 3.5–5.1)
Sodium: 136 — AB (ref 137–147)

## 2023-11-19 LAB — COMPREHENSIVE METABOLIC PANEL WITH GFR
Albumin: 3.1 — AB (ref 3.5–5.0)
Calcium: 8.3 — AB (ref 8.7–10.7)
Globulin: 2.4

## 2023-11-19 LAB — HEPATIC FUNCTION PANEL
ALT: 9 U/L (ref 7–35)
AST: 16 (ref 13–35)
Alkaline Phosphatase: 92 (ref 25–125)

## 2023-11-19 LAB — CBC AND DIFFERENTIAL
HCT: 32 — AB (ref 36–46)
Hemoglobin: 10.1 — AB (ref 12.0–16.0)
Platelets: 202 10*3/uL (ref 150–400)
WBC: 6.3

## 2023-11-19 LAB — CBC: RBC: 3.06 — AB (ref 3.87–5.11)

## 2023-11-19 NOTE — Assessment & Plan Note (Signed)
stable, on Pantoprazole  

## 2023-11-19 NOTE — Progress Notes (Signed)
 Location:   [facility] Room Number: N015-A Place of Service:  SNF (31) Provider:    Kareem Cathey X, NP  Patient Care Team: Tye Gall, MD as PCP - General (Internal Medicine) Boyce Byes, MD as PCP - Electrophysiology (Cardiology) Adair Actis, FNP [facility] Pall[facility])  Extended Emergency Contact Information Primary Emergency Contact: [facility] Phone: [phone] Mobile Phone: [phone] Relation: Daughter Interpreter needed? No Secondary Emergency Contact: Pearman,Karen Address: [address]          [place], [place] [address] [place] Home Phone: [phone] Mobile Phone: [phone] Relation: Daughter  Code Status:   Goals of care: Advanced Directive information    11/25/2023   12:38 PM  Advanced Directives  Does Patient Have a Medical Advance Directive? Yes  Type of Estate agent of [place];Out of facility DNR (pink MOST or yellow form)  Does patient want to make changes to medical advance directive? No - Patient declined  Copy of Healthcare Power of Attorney in Chart? Yes - validated most recent copy scanned in chart (See row information)     Chief Complaint  Patient presents with   Acute Visit    Skin tear    HPI:  Pt is a 89 y.o. female seen today for an acute visit for skin tear   Hospitalized 11/06/23-11/12/23 for closed displaced intertrochanteric fx of R femur, s/p IM, WBAT. Hospital stay was complicated with acute blood loss anemia and acute respiratory failure with hypoxia. Update CBC, CMO, Mg, P one week.    BRBPR 05/23/23, FOBT positive 05/24/23, noted hemorrhoids, placed PPI, H+H wnl 06/20/23              01/11/23 family expressed concerning of hand and fingers tingling and numbness, desires checking TSH 4.38 01/26/23 If Namenda  is the culprit. The patient stated her tingling and numbness in hands and fingers has been the same, declined workup or neurology referral.                            Chronic numbness of R+L hands, has been using fingerless gloves which helped, declined workup, neurology referral, or medication  Anemia, post op, on Iron , Hgb 7.6 11/12/23             Senile dementia, tolerated Namenda  well, helped with her mood as well. MMSE 27/30 04/09/23, CT head 02/12/22 Mild age-related atrophy and chronic microvascular ischemic changes.             Afib, off Metoprolol , on Amiodarone , followed by Cardiology, on  Eliquis .             AS moderate             HTN off Metoprolol  05/27/23 2/2 low Bp, on Furosemide ,  f/u Cardiology. Bun/creat 20/0.7 11/12/23             Pacemaker, SSS,  f/u Cardiology             CHF, AS, edema BLE, on Furosemide . EF 70-75% 09/19/21, 03/27/22 Venous US , negative DVT. 03/30/22 arterial US  mild to moderate obstructive disease BLE             CKD Bun/creat 20/0.7 11/12/23             Dysphagia, nectar liquid.              Hypothyroidism, taking Levothyroxine , TSH 4.206 11/07/23  OA, takes Tylenol , ambulates with walker prior to fx of R hip 11/06/23  GERD, stable, on Pantoprazole.   Constipation, taking Senokot S, MiraLax        Past Medical History:  Diagnosis Date   Aortic stenosis 03/08/2016   Brady-tachy syndrome (HCC)    a. Biotronik dual chamber PPM implanted 2009 b. gen change to STJ dual chamber PPM 2017   Ejection fraction    EF 70%, echo, October, 2009  //   EF 55-60%, septal dyssynergy consistent with a paced rhythm, mild to moderate mitral regurgitation, echo, November, 2015    GERD (gastroesophageal reflux disease) 04/28/2014   Episodes November, 2015 with fluid refluxing from her esophagus.    Hair loss    Patient questioned Coumadin, changed toPradaxa   Hypertension    Hypothyroidism    Lower extremity edema 01/22/2018   Mitral regurgitation 05/03/2014   Mild-to-moderate mitral regurgitation, echo, November, 2015    Osteoarthritis of left knee 10/25/2017   Paroxysmal atrial fibrillation (HCC)     Episodes rapid atrial fib noted by pacemaker interrogation, August, 2011, diltiazem  added, patient improved. Patient continues on Rythmol   //   Changed to flecainide  2013  //   flecainide  level checked in 2013, good level    Persistent atrial fibrillation (HCC)    Presence of permanent cardiac pacemaker    Past Surgical History:  Procedure Laterality Date   ABDOMINAL HYSTERECTOMY     EP IMPLANTABLE DEVICE N/A 08/08/2015   Generator change with SJM Assurity DR PPM by Dr Nunzio Belch   FEMUR IM NAIL Right 11/07/2023   Procedure: INSERTION, INTRAMEDULLARY ROD, FEMUR, RETROGRADE;  Surgeon: Wilhelmenia Harada, MD;  Location: [facility] OR;  Service: Orthopedics;  Laterality: Right;   JOINT REPLACEMENT     PACEMAKER PLACEMENT  2009             Review of Systems  Constitutional:  Negative for appetite change, fatigue and fever.  HENT:  Positive for hearing loss and trouble swallowing. Negative for congestion.   Eyes:  Negative for visual disturbance.  Respiratory:  Negative for cough, chest tightness and shortness of breath.   Cardiovascular:  Positive for leg swelling. Negative for chest pain and palpitations.  Gastrointestinal:  Negative for abdominal pain and constipation.  Genitourinary:  Negative for dysuria, frequency, hematuria and urgency.  Musculoskeletal:  Positive for arthralgias and gait problem.       Knees, L>R, intermittent in nature.  Left shoulder pain with ROM  Skin:  Negative for color change.       Chronic venous insufficiency skin changes: mild erythema in mid of R+L   Neurological:  Positive for numbness. Negative for tremors, speech difficulty, weakness and headaches.       Memory lapses. chronic numbness in fingers.   Psychiatric/Behavioral:  Negative for confusion and sleep disturbance. The patient is not nervous/anxious.     Immunization History  Administered Date(s) Administered   Fluad Quad(high Dose 65+) 04/02/2022   Fluzone Influenza virus vaccine,trivalent (IIV3),  split virus 02/24/2019, 03/08/2020, 03/25/2021   Influenza Split 03/11/2009, 03/11/2010, 02/25/2011   Influenza, High Dose Seasonal PF 03/08/2015, 04/03/2017, 03/17/2018, 03/08/2020, 04/15/2023   Influenza,inj,Quad PF,6+ Mos 03/08/2016   Influenza,inj,quad, With Preservative 03/11/2017   Moderna Covid-19 Vaccine  Bivalent Booster 10yrs & up 10/27/2021, 07/11/2023   Moderna SARS-COV2 Booster Vaccination 11/08/2020   Moderna Sars-Covid-2 Vaccination 06/15/2019, 07/13/2019, 04/19/2020, 02/27/2022   PNEUMOCOCCAL CONJUGATE-20 06/27/2023   Pfizer Covid-19 Vaccine Bivalent Booster 2yrs & up 02/28/2021, 04/12/2022   Pneumococcal Conjugate-13 12/06/2014  Pneumococcal Polysaccharide-23 05/13/2001, 03/11/2009   Polio, Unspecified 02/02/1958   Tdap 11/21/2011, 04/11/2023   Zoster Recombinant(Shingrix) 07/18/2022, 10/19/2022   Pertinent  Health Maintenance Due  Topic Date Due   INFLUENZA VACCINE  01/10/2024   DEXA SCAN  Completed      02/16/2022    9:45 AM 02/27/2022   11:41 AM 07/06/2022   10:04 AM 07/13/2022    3:55 PM 03/25/2023    4:51 PM  Fall Risk  Falls in the past year?   0 0 1  Was there an injury with Fall?   0 0 0  Fall Risk Category Calculator   0 0 1  (RETIRED) Patient Fall Risk Level High fall risk  High fall risk      Patient at Risk for Falls Due to  History of fall(s) No Fall Risks No Fall Risks History of fall(s);Impaired balance/gait;Impaired mobility  Fall risk Follow up  Falls evaluation completed   Falls evaluation completed Falls evaluation completed;Falls prevention discussed;Education provided     Data saved with a previous flowsheet row definition   Functional Status Survey:    Vitals:   11/19/23 1248 11/19/23 1257  BP: (!) 118/45 (!) 133/58  Pulse: 62   Resp: 20   Temp: (!) 97.4 F (36.3 C)   SpO2: 96%   Weight: 212 lb (96.2 kg)   Height: 5' 3 (1.6 m)    Body mass index is 37.55 kg/m. Physical Exam Vitals and nursing note reviewed.  Constitutional:       Appearance: Normal appearance.  HENT:     Head: Normocephalic and atraumatic.     Nose: Nose normal.     Mouth/Throat:     Mouth: Mucous membranes are moist.   Eyes:     Extraocular Movements: Extraocular movements intact.     Conjunctiva/sclera: Conjunctivae normal.     Pupils: Pupils are equal, round, and reactive to light.    Cardiovascular:     Rate and Rhythm: Normal rate. Rhythm irregular.     Heart sounds: Murmur heard.     Comments: No dorsalis pedis pulses felt from previous examination.  Pulmonary:     Effort: Pulmonary effort is normal.     Breath sounds: No rales.  Abdominal:     General: Bowel sounds are normal.     Palpations: Abdomen is soft.     Tenderness: There is no abdominal tenderness. There is no right CVA tenderness, left CVA tenderness, guarding or rebound.  Genitourinary:    Comments: External hemorrhoids from previous examination  Musculoskeletal:        General: Tenderness present. Normal range of motion.     Cervical back: Normal range of motion and neck supple.     Right lower leg: Edema present.     Left lower leg: Edema present.     Comments: Trace edema LLE, 1+ edema RLE Left shoulder pain with ROM R hip pain   Skin:    General: Skin is warm and dry.     Findings: Erythema present.     Comments: Mild dark erythema mid of R+L lower legs, chronic.   Skin tear RLE near healed.  R hip surgical wound.     Neurological:     General: No focal deficit present.     Mental Status: She is alert and oriented to person, place, and time. Mental status is at baseline.     Gait: Gait abnormal.     Comments: 5/5 muscle strength in fingers.  Psychiatric:        Mood and Affect: Mood normal.        Behavior: Behavior normal.     Labs reviewed: Recent Labs    11/10/23 1820 11/11/23 0505 11/12/23 0608 11/19/23 0000  NA 141 139 138 136*  K 3.5 3.4* 4.9 4.0  CL 99 101 102 102  CO2 30 31 31  28*  GLUCOSE 123* 121* 105*  --   BUN 26* 27*  20 18  CREATININE 0.84 0.68 0.70 0.8  CALCIUM  8.4* 8.0* 8.1* 8.3*  MG 2.1 2.1 2.2  --   PHOS 2.0* 2.6 2.4*  --    Recent Labs    11/10/23 1820 11/11/23 0505 11/12/23 0608 11/19/23 0000  AST 31 30 32 16  ALT 17 16 18 9   ALKPHOS 39 36* 38 92  BILITOT 0.8 1.3* 1.7*  --   PROT 5.5* 5.1* 5.2*  --   ALBUMIN 2.7* 2.6* 2.6* 3.1*   Recent Labs    11/10/23 0605 11/10/23 1820 11/11/23 0505 11/12/23 0608 11/19/23 0000 11/22/23 1039  WBC 7.8  --  6.9 6.8 6.3 7.3  NEUTROABS 5.1  --  4.1 3.9  --   --   HGB 7.9*   < > 7.9* 7.6* 10.1* 11.4  HCT 24.2*   < > 24.7* 23.8* 32* 36.2  MCV 103.4*  --  105.6* 105.3*  --  105*  PLT 108*  --  126* 142* 202 240   < > = values in this interval not displayed.   Lab Results  Component Value Date   TSH 4.206 11/07/2023   Lab Results  Component Value Date   HGBA1C 4.8 11/08/2023   Lab Results  Component Value Date   CHOL 159 09/14/2023   HDL 42 09/14/2023   LDLCALC 100 (H) 09/14/2023   TRIG 85 09/14/2023   CHOLHDL 3.8 09/14/2023    Significant Diagnostic Results in last 30 days:  DG CHEST PORT 1 VIEW Result Date: 11/11/2023 CLINICAL DATA:  Shortness of breath and hypertension. EXAM: PORTABLE CHEST 1 VIEW COMPARISON:  11/10/2023 FINDINGS: There is a left chest wall pacer with leads in the right atrial appendage and right ventricle. Stable cardiomediastinal contours. Aortic atherosclerotic calcifications. Atelectasis versus airspace disease noted within the periphery of the left base. Visualized osseous structures appear grossly intact. IMPRESSION: Opacity within the periphery of the left base compatible with atelectasis versus airspace disease. Aortic Atherosclerosis (ICD10-I70.0). Electronically Signed   By: Kimberley Penman M.D.   On: 11/11/2023 08:07   DG CHEST PORT 1 VIEW Result Date: 11/10/2023 CLINICAL DATA:  Shortness of breath. EXAM: PORTABLE CHEST 1 VIEW COMPARISON:  11/09/2023 FINDINGS: The lungs are clear without focal pneumonia, edema,  pneumothorax or pleural effusion. Cardiopericardial silhouette is at upper limits of normal for size. Degenerative changes noted in both shoulders. Left-sided permanent pacemaker again noted. Telemetry leads overlie the chest. IMPRESSION: No active disease. No features of pulmonary edema on the current study. Electronically Signed   By: Donnal Fusi M.D.   On: 11/10/2023 09:15   DG CHEST PORT 1 VIEW Result Date: 11/09/2023 CLINICAL DATA:  [id number] SOB (shortness of breath) [id number] EXAM: PORTABLE CHEST - 1 VIEW COMPARISON:  11/08/2023. FINDINGS: Cardiac silhouette is prominent. There is pulmonary interstitial prominence with vascular congestion. No focal consolidation. No pneumothorax or pleural effusion identified. Aorta is calcified. There is a left-sided pacer. IMPRESSION: Findings suggest CHF. Electronically Signed   By: Sydell Eva M.D.   On: 11/09/2023 08:38  DG CHEST PORT 1 VIEW Result Date: 11/08/2023 CLINICAL DATA:  Shortness of breath EXAM: PORTABLE CHEST 1 VIEW COMPARISON:  Chest x-Blatt 11/06/2023 FINDINGS: Enlarged cardiopericardial silhouette. Calcified aorta. Prominent central vascular congestion. Left upper chest pacemaker leads along the right side of the heart. Subtle opacity at the lung bases. Atelectasis is favored. Recommend follow-up. No pneumothorax. No effusion or consolidation. Films are under penetrated. IMPRESSION: Enlarged cardiopericardial silhouette with vascular congestion and pacemaker. Subtle opacity at the bases, favor atelectasis. Recommend follow-up. Electronically Signed   By: Adrianna Horde M.D.   On: 11/08/2023 14:59   DG FEMUR, MIN 2 VIEWS RIGHT Result Date: 11/07/2023 CLINICAL DATA:  Elective surgery. EXAM: RIGHT FEMUR 2 VIEWS COMPARISON:  Preoperative imaging FINDINGS: Nine fluoroscopic spot views of the right femur submitted from the operating room. Femoral intramedullary nail with trans trochanteric and distal locking screw fixation of proximal femur fracture.  Fluoroscopy time 176.7 seconds. Dose 71.17 mGy. IMPRESSION: Intraoperative fluoroscopy during proximal femur fracture fixation. Electronically Signed   By: Chadwick Colonel M.D.   On: 11/07/2023 20:22   DG C-Arm 1-60 Min-No Report Result Date: 11/07/2023 Fluoroscopy was utilized by the requesting physician.  No radiographic interpretation.   DG C-Arm 1-60 Min-No Report Result Date: 11/07/2023 Fluoroscopy was utilized by the requesting physician.  No radiographic interpretation.   CT HEAD WO CONTRAST Result Date: 11/06/2023 CLINICAL DATA:  Head trauma, mechanical fall. EXAM: CT HEAD WITHOUT CONTRAST CT CERVICAL SPINE WITHOUT CONTRAST TECHNIQUE: Multidetector CT imaging of the head and cervical spine was performed following the standard protocol without intravenous contrast. Multiplanar CT image reconstructions of the cervical spine were also generated. RADIATION DOSE REDUCTION: This exam was performed according to the departmental dose-optimization program which includes automated exposure control, adjustment of the mA and/or kV according to patient size and/or use of iterative reconstruction technique. COMPARISON:  CT head 09/13/2023, CT cervical spine 09/25/2020. FINDINGS: CT HEAD FINDINGS Brain: No acute intracranial hemorrhage. No CT evidence of acute infarct. Nonspecific hypoattenuation in the periventricular and subcortical white matter favored to reflect chronic microvascular ischemic changes. Generalized parenchymal volume loss. No edema, mass effect, or midline shift. The basilar cisterns are patent. Ventricles: The ventricles are normal. Vascular: Atherosclerotic calcifications of the carotid siphons. No hyperdense vessel. Skull: No acute or aggressive finding. Orbits: Orbits are symmetric. Sinuses: Complete opacification of the left maxillary sinus. Mild mucosal thickening in the ethmoid sinuses and right sphenoid sinus. Other: Mastoid air cells are clear. CT CERVICAL SPINE FINDINGS Alignment:  Straightening of the normal cervical lordosis. No significant listhesis. No facet subluxation or dislocation. Skull base and vertebrae: No acute fracture. No primary bone lesion or focal pathologic process. Soft tissues and spinal canal: No prevertebral fluid or swelling. No visible canal hematoma. Disc levels: Intervertebral disc space narrowing at multiple levels most pronounced at C4-5. Disc osteophyte complexes at multiple levels. Disc bulge and thickening of the ligamentum flavum at C3-4 resulting in moderate spinal canal stenosis. Additional disc osteophyte complexes at C4-5 and C5-6 contributing to moderate spinal canal stenosis. Facet arthrosis and uncovertebral hypertrophy at multiple levels. Foraminal narrowing most pronounced on the left at C6-7. Upper chest: Negative. Other: None. IMPRESSION: No CT evidence of acute intracranial abnormality. No acute fracture or traumatic malalignment of the cervical spine. Degenerative changes as above. Moderate chronic microvascular ischemic changes and generalized parenchymal volume loss. Electronically Signed   By: Denny Flack M.D.   On: 11/06/2023 18:25   CT CERVICAL SPINE WO CONTRAST Result Date: 11/06/2023 CLINICAL DATA:  Head trauma, mechanical fall. EXAM: CT HEAD WITHOUT CONTRAST CT CERVICAL SPINE WITHOUT CONTRAST TECHNIQUE: Multidetector CT imaging of the head and cervical spine was performed following the standard protocol without intravenous contrast. Multiplanar CT image reconstructions of the cervical spine were also generated. RADIATION DOSE REDUCTION: This exam was performed according to the departmental dose-optimization program which includes automated exposure control, adjustment of the mA and/or kV according to patient size and/or use of iterative reconstruction technique. COMPARISON:  CT head 09/13/2023, CT cervical spine 09/25/2020. FINDINGS: CT HEAD FINDINGS Brain: No acute intracranial hemorrhage. No CT evidence of acute infarct. Nonspecific  hypoattenuation in the periventricular and subcortical white matter favored to reflect chronic microvascular ischemic changes. Generalized parenchymal volume loss. No edema, mass effect, or midline shift. The basilar cisterns are patent. Ventricles: The ventricles are normal. Vascular: Atherosclerotic calcifications of the carotid siphons. No hyperdense vessel. Skull: No acute or aggressive finding. Orbits: Orbits are symmetric. Sinuses: Complete opacification of the left maxillary sinus. Mild mucosal thickening in the ethmoid sinuses and right sphenoid sinus. Other: Mastoid air cells are clear. CT CERVICAL SPINE FINDINGS Alignment: Straightening of the normal cervical lordosis. No significant listhesis. No facet subluxation or dislocation. Skull base and vertebrae: No acute fracture. No primary bone lesion or focal pathologic process. Soft tissues and spinal canal: No prevertebral fluid or swelling. No visible canal hematoma. Disc levels: Intervertebral disc space narrowing at multiple levels most pronounced at C4-5. Disc osteophyte complexes at multiple levels. Disc bulge and thickening of the ligamentum flavum at C3-4 resulting in moderate spinal canal stenosis. Additional disc osteophyte complexes at C4-5 and C5-6 contributing to moderate spinal canal stenosis. Facet arthrosis and uncovertebral hypertrophy at multiple levels. Foraminal narrowing most pronounced on the left at C6-7. Upper chest: Negative. Other: None. IMPRESSION: No CT evidence of acute intracranial abnormality. No acute fracture or traumatic malalignment of the cervical spine. Degenerative changes as above. Moderate chronic microvascular ischemic changes and generalized parenchymal volume loss. Electronically Signed   By: Denny Flack M.D.   On: 11/06/2023 18:25   CT PELVIS WO CONTRAST Result Date: 11/06/2023 CLINICAL DATA:  Hip trauma, fracture suspected, xray done Right hip pain. EXAM: CT OF THE PELVIS WITHOUT CONTRAST TECHNIQUE:  Multidetector CT imaging of the pelvis was performed according to the standard protocol. Multiplanar CT image reconstructions were also generated. RADIATION DOSE REDUCTION: This exam was performed according to the departmental dose-optimization program which includes automated exposure control, adjustment of the mA and/or kV according to patient size and/or use of iterative reconstruction technique. COMPARISON:  Radiograph earlier today FINDINGS: Bones/Joint/Cartilage Comminuted and displaced proximal femur fracture. There is a displaced component involving the lesser trochanter. There is a transverse subtrochanteric component. Fracture extends to involve the base of the femoral neck. No hip dislocation, the femoral head remains seated. Intact pubic rami. No sacral fracture. Minimal right hip joint effusion. Ligaments Suboptimally assessed by CT. Muscles and Tendons Mild edema in the skill a chair today shin to the fracture. No muscle atrophy. Soft tissues Colonic diverticulosis without diverticulitis. Stable 3.7 cm cystic structure in the left adnexa adjacent to the left aspect of the vaginal cuff from 2023 hip CT, likely benign given stability. There is a fat containing umbilical hernia. Aortic atherosclerosis. IMPRESSION: Comminuted and displaced proximal right femur fracture with a displaced component involving the lesser trochanter. There is a transverse subtrochanteric component. Fracture extends to involve the base of the femoral neck. Aortic Atherosclerosis (ICD10-I70.0). Electronically Signed   By: Alvina Axon.D.  On: 11/06/2023 18:19   DG Hip Unilat With Pelvis 2-3 Views Right Result Date: 11/06/2023 CLINICAL DATA:  fall EXAM: DG HIP (WITH OR WITHOUT PELVIS) 2-3V RIGHT COMPARISON:  None Available. FINDINGS: Acute comminuted and markedly displaced right intertrochanteric femoral fracture. No right hip dislocation. Query slight irregularity of the left femoral neck. Poorly visualized left hip-  limited evaluation due to overlapping osseous structures and overlying soft tissues. view. No acute displaced fracture or diastasis of the bones of the pelvis. No acute displaced fractured diastasis of the bones of the pelvis. There is no evidence of severe arthropathy or other focal bone abnormality. IMPRESSION: Acute comminuted and markedly displaced right intertrochanteric femoral fracture. Poorly visualized left hip- limited evaluation due to overlapping osseous structures and overlying soft tissues. Query slight irregularity of the left femoral neck. Recommend dedicated three view radiograph of the left hip for further evaluation. Electronically Signed   By: Morgane  Naveau M.D.   On: 11/06/2023 17:48   DG Chest Port 1 View Result Date: 11/06/2023 CLINICAL DATA:  fall EXAM: PORTABLE CHEST 1 VIEW COMPARISON:  Chest x-Fiorella 09/13/2023 FINDINGS: Left chest wall dual lead pacemaker. The heart and mediastinal contours are unchanged. Atherosclerotic plaque. Left base atelectasis. No focal consolidation. Chronic coarsened interstitial markings with no overt pulmonary edema. No pleural effusion. No pneumothorax. No acute osseous abnormality. IMPRESSION: 1. No active disease. 2.  Aortic Atherosclerosis (ICD10-I70.0). Electronically Signed   By: Morgane  Naveau M.D.   On: 11/06/2023 17:43    Assessment/Plan Skin tear of lower leg without complication Continue daily dressing change, observe for s/s of infection.   Right hip pain Hospitalized 11/06/23-11/12/23 for closed displaced intertrochanteric fx of R femur, s/p IM, WBAT. Tylenol  and Oxycodone  available to her for pain.   IDA (iron  deficiency anemia) post op, on Iron , Hgb 7.6 11/12/23  Major neurocognitive disorder (HCC) tolerated Namenda  well, helped with her mood as well. MMSE 27/30 04/09/23, CT head 02/12/22 Mild age-related atrophy and chronic microvascular ischemic changes. 11/18/23 urine culture negative for UTI  Persistent atrial fibrillation  (HCC) off Metoprolol , on Amiodarone , followed by Cardiology, on  Eliquis .  Essential hypertension, benign off Metoprolol  05/27/23 2/2 low Bp, on Furosemide ,  f/u Cardiology. Bun/creat 20/0.7 11/12/23  Pacemaker SSS,  f/u Cardiology  Congestive heart failure (HCC) AS, edema BLE, on Furosemide . EF 70-75% 09/19/21, 03/27/22 Venous US , negative DVT. 03/30/22 arterial US  mild to moderate obstructive disease BLE  CKD (chronic kidney disease) stage 3, GFR 30-59 ml/min (HCC) Bun/creat 20/0.7 11/12/23  Hypothyroidism taking Levothyroxine , TSH 4.206 11/07/23  GERD (gastroesophageal reflux disease) stable, on Pantoprazole.   Slow transit constipation taking Senokot S, MiraLax        Family/ staff Communication: plan of care reviewed with the patient and charge nurse  Labs/tests ordered:   none

## 2023-11-19 NOTE — Assessment & Plan Note (Signed)
 off Metoprolol  05/27/23 2/2 low Bp, on Furosemide ,  f/u Cardiology. Bun/creat 20/0.7 11/12/23

## 2023-11-19 NOTE — Assessment & Plan Note (Signed)
 post op, on Iron , Hgb 7.6 11/12/23

## 2023-11-19 NOTE — Assessment & Plan Note (Signed)
 AS, edema BLE, on Furosemide . EF 70-75% 09/19/21, 03/27/22 Venous US , negative DVT. 03/30/22 arterial US  mild to moderate obstructive disease BLE

## 2023-11-19 NOTE — Assessment & Plan Note (Signed)
 taking Senokot S, MiraLax 

## 2023-11-19 NOTE — Assessment & Plan Note (Signed)
 Hospitalized 11/06/23-11/12/23 for closed displaced intertrochanteric fx of R femur, s/p IM, WBAT. Tylenol  and Oxycodone  available to her for pain.

## 2023-11-19 NOTE — Assessment & Plan Note (Signed)
 SSS,  f/u Cardiology

## 2023-11-19 NOTE — Assessment & Plan Note (Signed)
 tolerated Namenda well, helped with her mood as well. MMSE 27/30 04/09/23, CT head 02/12/22 Mild age-related atrophy and chronic microvascular ischemic changes.

## 2023-11-19 NOTE — Assessment & Plan Note (Signed)
 off Metoprolol , on Amiodarone , followed by Cardiology, on  Eliquis .

## 2023-11-19 NOTE — Assessment & Plan Note (Signed)
 Continue daily dressing change, observe for s/s of infection.

## 2023-11-19 NOTE — Assessment & Plan Note (Signed)
 taking Levothyroxine , TSH 4.206 11/07/23

## 2023-11-19 NOTE — Assessment & Plan Note (Signed)
 Bun/creat 20/0.7 11/12/23

## 2023-11-20 ENCOUNTER — Encounter (HOSPITAL_BASED_OUTPATIENT_CLINIC_OR_DEPARTMENT_OTHER): Payer: Self-pay | Admitting: Student

## 2023-11-20 ENCOUNTER — Ambulatory Visit (HOSPITAL_BASED_OUTPATIENT_CLINIC_OR_DEPARTMENT_OTHER)

## 2023-11-20 ENCOUNTER — Ambulatory Visit (HOSPITAL_BASED_OUTPATIENT_CLINIC_OR_DEPARTMENT_OTHER): Admitting: Student

## 2023-11-20 DIAGNOSIS — Z96651 Presence of right artificial knee joint: Secondary | ICD-10-CM | POA: Diagnosis not present

## 2023-11-20 DIAGNOSIS — S72121A Displaced fracture of lesser trochanter of right femur, initial encounter for closed fracture: Secondary | ICD-10-CM | POA: Diagnosis not present

## 2023-11-20 DIAGNOSIS — S72141A Displaced intertrochanteric fracture of right femur, initial encounter for closed fracture: Secondary | ICD-10-CM

## 2023-11-20 NOTE — Progress Notes (Signed)
 Post Operative Evaluation    Procedure/Date of Surgery: Right hip cephalomedullary nail 11/07/2023  Interval History:   Presents 2 weeks post the above procedure.  Unfortunately she has not been able to mobilize significantly although she is placing weight on the right leg with walker.   PMH/PSH/Family History/Social History/Meds/Allergies:    Past Medical History:  Diagnosis Date   Aortic stenosis 03/08/2016   Brady-tachy syndrome (HCC)    a. Biotronik dual chamber PPM implanted 2009 b. gen change to STJ dual chamber PPM 2017   Ejection fraction    EF 70%, echo, October, 2009  //   EF 55-60%, septal dyssynergy consistent with a paced rhythm, mild to moderate mitral regurgitation, echo, November, 2015    GERD (gastroesophageal reflux disease) 04/28/2014   Episodes November, 2015 with fluid refluxing from her esophagus.    Hair loss    Patient questioned Coumadin, changed toPradaxa   Hypertension    Hypothyroidism    Lower extremity edema 01/22/2018   Mitral regurgitation 05/03/2014   Mild-to-moderate mitral regurgitation, echo, November, 2015    Osteoarthritis of left knee 10/25/2017   Paroxysmal atrial fibrillation (HCC)    Episodes rapid atrial fib noted by pacemaker interrogation, August, 2011, diltiazem  added, patient improved. Patient continues on Rythmol   //   Changed to flecainide  2013  //   flecainide  level checked in 2013, good level    Persistent atrial fibrillation (HCC)    Presence of permanent cardiac pacemaker    Past Surgical History:  Procedure Laterality Date   ABDOMINAL HYSTERECTOMY     EP IMPLANTABLE DEVICE N/A 08/08/2015   Generator change with SJM Assurity DR PPM by Dr Nunzio Belch   FEMUR IM NAIL Right 11/07/2023   Procedure: INSERTION, INTRAMEDULLARY ROD, FEMUR, RETROGRADE;  Surgeon: Wilhelmenia Harada, MD;  Location: [facility] OR;  Service: Orthopedics;  Laterality: Right;   JOINT REPLACEMENT     PACEMAKER PLACEMENT  2009        Social History   Socioeconomic History   Marital status: Widowed    Spouse name: Not on file   Number of children: Not on file   Years of education: Not on file   Highest education level: Not on file  Occupational History   Not on file  Tobacco Use   Smoking status: Former    Current packs/day: 0.00    Types: Cigarettes    Quit date: 09/06/1990    Years since quitting: 33.2   Smokeless tobacco: Never  Vaping Use   Vaping status: Never Used  Substance and Sexual Activity   Alcohol  use: No   Drug use: No   Sexual activity: Not on file  Other Topics Concern   Not on file  Social History Narrative   Not on file   Social Drivers of Health   Financial Resource Strain: Not on file  Food Insecurity: No Food Insecurity (11/06/2023)   Hunger Vital Sign    Worried About Running Out of Food in the Last Year: Never true    Ran Out of Food in the Last Year: Never true  Transportation Needs: No Transportation Needs (11/06/2023)   PRAPARE - Administrator, Civil Service (Medical): No    Lack of Transportation (Non-Medical): No  Physical Activity: Not on file  Stress: Not on file  Social Connections: Moderately  Integrated (11/06/2023)   Social Connection and Isolation Panel [NHANES]    Frequency of Communication with Friends and Family: Twice a week    Frequency of Social Gatherings with Friends and Family: Three times a week    Attends Religious Services: 1 to 4 times per year    Active Member of Clubs or Organizations: No    Attends Engineer, structural: More than 4 times per year    Marital Status: Widowed   Family History  Problem Relation Age of Onset   Heart disease Father    Coronary artery disease Other    Heart disease Son     Current Outpatient Medications  Medication Sig Dispense Refill   acetaminophen  (TYLENOL ) 500 MG tablet Take 2 tablets (1,000 mg total) by mouth every 8 (eight) hours as needed for moderate pain (pain score 4-6). (Patient  not taking: Reported on 11/19/2023)     albuterol  (PROVENTIL ) (2.5 MG/3ML) 0.083% nebulizer solution Take 3 mLs (2.5 mg total) by nebulization every 6 (six) hours as needed for wheezing or shortness of breath.     amiodarone  (PACERONE ) 200 MG tablet Take 1 tablet (200 mg total) by mouth 2 (two) times daily for 7 days, THEN 1 tablet (200 mg total) daily for 7 days.     apixaban  (ELIQUIS ) 5 MG TABS tablet Take 1 tablet (5 mg total) by mouth 2 (two) times daily. 180 tablet 1   Apoaequorin (PREVAGEN) 10 MG CAPS Take 10 mg by mouth daily. 90 capsule 5   betamethasone , augmented, (DIPROLENE ) 0.05 % gel Apply topically daily.  Apply to scalp topically every 12 hours as needed for psoriasis related to PSORIASIS, UNSPECIFIED (L40.9) apply to affected area of scalp 60 g 2   bisacodyl  (DULCOLAX) 10 MG suppository Place 1 suppository (10 mg total) rectally daily as needed for moderate constipation.     brimonidine -timolol  (COMBIGAN ) 0.2-0.5 % ophthalmic solution Place 1 drop into the right eye every 12 (twelve) hours.     Calcium  Carbonate-Vit D-Min (CALCIUM  600+D PLUS MINERALS) 600-400 MG-UNIT TABS Take 1 tablet by mouth daily. 60 tablet 5   dextromethorphan -guaiFENesin  (MUCINEX  DM) 30-600 MG 12hr tablet Take 1 tablet by mouth 2 (two) times daily.     diclofenac  Sodium (VOLTAREN ) 1 % GEL Apply 1 g topically 2 (two) times daily as needed (to L foot bunion deformity for pain).     furosemide  (LASIX ) 20 MG tablet TAKE ONE TABLET BY MOUTH EVERY DAY 90 tablet 1   Ipratropium-Albuterol  (COMBIVENT) 20-100 MCG/ACT AERS respimat Inhale 2 puffs into the lungs every 6 (six) hours as needed for shortness of breath.     Iron , Ferrous Sulfate , 325 (65 Fe) MG TABS Take 325 mg by mouth daily.     levothyroxine  (SYNTHROID ) 100 MCG tablet TAKE ONE TABLET BY MOUTH EVERY DAY IN THE MORNING 90 tablet 3   lidocaine  (HM LIDOCAINE  PATCH) 4 % Place 1 patch onto the skin in the morning. Apply topically to L shoulder in the morning. Apply  for 12 hours (0900) then removed for 12 hours (2100).     Multiple Vitamin (MULTIVITAMIN ADULT) TABS Take 1 tablet by mouth in the morning.     Multiple Vitamins-Minerals (OCUVITE PO) Take 1 capsule by mouth in the morning.     NAMENDA  10 MG tablet TAKE ONE TABLET BY MOUTH TWICE A DAY FOR COGNITIVE DECLINE 60 tablet 5   nystatin cream (MYCOSTATIN) Apply 1 Application topically every 12 (twelve) hours as needed (for rash).  ondansetron  (ZOFRAN ) 4 MG tablet Take 1 tablet (4 mg total) by mouth every 6 (six) hours as needed for nausea.     oxyCODONE  (ROXICODONE ) 5 MG immediate release tablet Take 1 tablet (5 mg total) by mouth every 6 (six) hours as needed for severe pain (pain score 7-10). 30 tablet 0   pantoprazole (PROTONIX) 40 MG tablet Take 40 mg by mouth daily as needed (for indigestion).     Polyethyl Glyc-Propyl Glyc PF (SYSTANE HYDRATION PF) 0.4-0.3 % SOLN Place 1 drop into the right eye in the morning, at noon, in the evening, and at bedtime.     polyethylene glycol (MIRALAX  / GLYCOLAX ) 17 g packet Take 17 g by mouth every other day.     potassium chloride  (KLOR-CON  M) 10 MEQ tablet Take 1 tablet (10 mEq total) by mouth daily. 90 tablet 3   senna-docusate (SENOKOT-S) 8.6-50 MG tablet Take 1 tablet by mouth at bedtime as needed for mild constipation.     sennosides-docusate sodium  (SENOKOT-S) 8.6-50 MG tablet Take 1 tablet by mouth daily.     No current facility-administered medications for this visit.   No results found.  Review of Systems:   A ROS was performed including pertinent positives and negatives as documented in the HPI.   Musculoskeletal Exam:    There were no vitals taken for this visit.  Right hip incision is well-appearing with staples in place.  Incision is clean dry and intact.  30 degrees internal rotation without significant pain.  Distal neurosensory exam is intact  Imaging:    Xray (2 views right hip): Status post right hip cephalomedullary nail without  evidence of complication   I personally reviewed and interpreted the radiographs.   Assessment:   2 weeks status post right hip nailing overall continuing to improve.  At this time she will continue to work on gait and mobility.  I will plan to see her back in 4 weeks for reassessment  Plan :    - Return to clinic 4 weeks for reassessment      I personally saw and evaluated the patient, and participated in the management and treatment plan.  Sharrell Deck, PA-C Orthopedics

## 2023-11-20 NOTE — Progress Notes (Signed)
 Remote pacemaker transmission.

## 2023-11-21 NOTE — Progress Notes (Signed)
  Electrophysiology Office Follow up Visit Note:    Date:  11/22/2023   ID:  [name], DOB Sep 13, 1927, MRN [id number]  PCP:  Tye Gall, MD  [facility] Cardiologist:  None  [facility] Electrophysiologist:  Boyce Byes, MD    Interval History:     [name] is a 89 y.o. female who presents for a follow up visit.   The patient was previously followed by Dr. Nunzio Belch.  She has a history of atrial fibrillation, symptomatic bradycardia post permanent pacemaker implant, hypertension, hyperlipidemia and aortic stenosis.  The patient was last seen by [facility] May 15, 2023.  The patient takes Eliquis  for stroke prophylaxis.  By device interrogation her burden of atrial fibrillation has been less than 1%.  She is on flecainide  and metoprolol .  She is doing well.  She is with family.  She is slowly recovering after fracturing her hip.  She is in a wheelchair.  She does put weight on the hip but recovery has been slow.  No pain today.  No problems with her pacemaker.        Past medical, surgical, social and family history were reviewed.  ROS:   Please see the history of present illness.    All other systems reviewed and are negative.  EKGs/Labs/Other Studies Reviewed:    The following studies were reviewed today:  November 22, 2023 in-clinic device interrogation personally reviewed Battery longevity 2.8 years A-fib burden less than 1% Lead parameters stable   September 14, 2023 echo EF greater than 75% RV normal  November 10, 2023 EKG shows atrial fibrillation      Physical Exam:    VS:  BP (!) 138/50 (BP Location: Left Arm, Patient Position: Sitting, Cuff Size: Large)   Pulse 62   SpO2 95%     Wt Readings from Last 3 Encounters:  11/19/23 212 lb (96.2 kg)  11/15/23 212 lb (96.2 kg)  11/13/23 196 lb 12.8 oz (89.3 kg)     GEN: no distress CARD: RRR, No MRG RESP: No IWOB. CTAB.      ASSESSMENT:    1. Paroxysmal atrial fibrillation (HCC)   2. SSS  (sick sinus syndrome) (HCC)   3. Cardiac pacemaker in situ    PLAN:    In order of problems listed above:  #Sick sinus syndrome #Permanent pacemaker in situ Device functioning appropriately.  Continue remote monitoring  #Paroxysmal atrial fibrillation Continue Eliquis  for stroke prophylaxis Low burden by device interrogation Continue amiodarone  200 mg by mouth once daily  #Anemia Likely postop after her hip I will recheck a CBC today to make sure this is trending up  Follow-up with Renee in 6 months   Signed, Harvie Liner, MD, [facility], [facility] 11/22/2023 9:59 AM    Electrophysiology [facility]

## 2023-11-22 ENCOUNTER — Encounter: Payer: Self-pay | Admitting: Cardiology

## 2023-11-22 ENCOUNTER — Other Ambulatory Visit: Payer: Self-pay

## 2023-11-22 ENCOUNTER — Ambulatory Visit: Attending: Cardiology | Admitting: Cardiology

## 2023-11-22 VITALS — BP 138/50 | HR 62

## 2023-11-22 DIAGNOSIS — I495 Sick sinus syndrome: Secondary | ICD-10-CM | POA: Diagnosis not present

## 2023-11-22 DIAGNOSIS — I480 Paroxysmal atrial fibrillation: Secondary | ICD-10-CM

## 2023-11-22 DIAGNOSIS — Z950 Presence of cardiac pacemaker: Secondary | ICD-10-CM

## 2023-11-22 NOTE — Patient Instructions (Addendum)
 Medication Instructions:  Your physician recommends that you continue on your current medications as directed. Please refer to the Current Medication list given to you today.  *If you need a refill on your cardiac medications before your next appointment, please call your pharmacy*  Labs: TODAY: CBC  Follow-Up: At [facility], you and your health needs are our priority.  As part of our continuing mission to provide you with exceptional heart care, our providers are all part of one team.  This team includes your primary Cardiologist (physician) and Advanced Practice Providers or APPs (Physician Assistants and Nurse Practitioners) who all work together to provide you with the care you need, when you need it.  Your next appointment:   1 year  Provider:   Mertha Abrahams, PA-C {

## 2023-11-23 LAB — CBC
Hematocrit: 36.2 % (ref 34.0–46.6)
Hemoglobin: 11.4 g/dL (ref 11.1–15.9)
MCH: 32.9 pg (ref 26.6–33.0)
MCHC: 31.5 g/dL (ref 31.5–35.7)
MCV: 105 fL — ABNORMAL HIGH (ref 79–97)
Platelets: 240 10*3/uL (ref 150–450)
RBC: 3.46 x10E6/uL — ABNORMAL LOW (ref 3.77–5.28)
RDW: 13.8 % (ref 11.7–15.4)
WBC: 7.3 10*3/uL (ref 3.4–10.8)

## 2023-11-25 ENCOUNTER — Encounter: Payer: Self-pay | Admitting: Sports Medicine

## 2023-11-25 ENCOUNTER — Non-Acute Institutional Stay (SKILLED_NURSING_FACILITY): Payer: Self-pay | Admitting: Sports Medicine

## 2023-11-25 DIAGNOSIS — I509 Heart failure, unspecified: Secondary | ICD-10-CM | POA: Diagnosis not present

## 2023-11-25 DIAGNOSIS — F0390 Unspecified dementia without behavioral disturbance: Secondary | ICD-10-CM | POA: Diagnosis not present

## 2023-11-25 DIAGNOSIS — S72141A Displaced intertrochanteric fracture of right femur, initial encounter for closed fracture: Secondary | ICD-10-CM

## 2023-11-25 DIAGNOSIS — K5900 Constipation, unspecified: Secondary | ICD-10-CM | POA: Diagnosis not present

## 2023-11-25 DIAGNOSIS — I480 Paroxysmal atrial fibrillation: Secondary | ICD-10-CM

## 2023-11-25 NOTE — Progress Notes (Signed)
 Location:   [facility]  Nursing Home Room Number: 15-A Place of Service:  SNF (31) Provider:  Fredy Jericho    Patient Care Team: Tye Gall, MD as PCP - General (Internal Medicine) Boyce Byes, MD as PCP - Electrophysiology (Cardiology) Adair Actis, FNP [facility] and Palliative Medicine)  Extended Emergency Contact Information Primary Emergency Contact: [facility] Phone: [phone] Mobile Phone: [phone] Relation: Daughter Interpreter needed? No Secondary Emergency Contact: Pearman,Karen Address: [address]          [place], [place] [address] [place] Home Phone: [phone] Mobile Phone: [phone] Relation: Daughter  Code Status:  DNR Goals of care: Advanced Directive information    11/25/2023   12:38 PM  Advanced Directives  Does Patient Have a Medical Advance Directive? Yes  Type of Estate agent of [place];Out of facility DNR (pink MOST or yellow form)  Does patient want to make changes to medical advance directive? No - Patient declined  Copy of Healthcare Power of Attorney in Chart? Yes - validated most recent copy scanned in chart (See row information)     Chief Complaint  Patient presents with   Acute Visit    Incision site redness.    HPI:  Pt is a 89 y.o. female with past medical history of dementia, right hip fracture, osteoarthritis, A-fib, CHF is seen today for an acute visit for staff report for redness around the incision site. Patient seen and examined in her room.  She seems pleasant and comfortable and does not appear to be in distress. She is sitting in a recliner chair.  She is hoyer lift dependent. Staff noticed the redness along the incision site since few days.  Patient denies pain around the incision.  No gaping or discharge noted. Patient denies pain.  She is currently getting oxycodone  5 mg twice daily and 5 mg every 6 as needed.  Patient has  a small superficial pressure ulcer on the left buttock.  Slight redness noted.  No discharge or drainage noted.  Past Medical History:  Diagnosis Date   Aortic stenosis 03/08/2016   Brady-tachy syndrome (HCC)    a. Biotronik dual chamber PPM implanted 2009 b. gen change to STJ dual chamber PPM 2017   Ejection fraction    EF 70%, echo, October, 2009  //   EF 55-60%, septal dyssynergy consistent with a paced rhythm, mild to moderate mitral regurgitation, echo, November, 2015    GERD (gastroesophageal reflux disease) 04/28/2014   Episodes November, 2015 with fluid refluxing from her esophagus.    Hair loss    Patient questioned Coumadin, changed toPradaxa   Hypertension    Hypothyroidism    Lower extremity edema 01/22/2018   Mitral regurgitation 05/03/2014   Mild-to-moderate mitral regurgitation, echo, November, 2015    Osteoarthritis of left knee 10/25/2017   Paroxysmal atrial fibrillation (HCC)    Episodes rapid atrial fib noted by pacemaker interrogation, August, 2011, diltiazem  added, patient improved. Patient continues on Rythmol   //   Changed to flecainide  2013  //   flecainide  level checked in 2013, good level    Persistent atrial fibrillation (HCC)    Presence of permanent cardiac pacemaker    Past Surgical History:  Procedure Laterality Date   ABDOMINAL HYSTERECTOMY     EP IMPLANTABLE DEVICE N/A 08/08/2015   Generator change with SJM Assurity DR PPM by Dr Nunzio Belch   FEMUR IM NAIL Right 11/07/2023   Procedure: INSERTION, INTRAMEDULLARY ROD, FEMUR, RETROGRADE;  Surgeon: Hermina Loosen,  Landon Pinion, MD;  Location: [facility] OR;  Service: Orthopedics;  Laterality: Right;   JOINT REPLACEMENT     PACEMAKER PLACEMENT  2009        Allergies  Allergen Reactions   Morphine And Codeine Other (See Comments)    Patient states that she went crazy with this   Penicillins Swelling and Rash        Aspirin      Pacemaker   Clindamycin/Lincomycin Other (See Comments)    Any meds with mycin in the  name Unknown reaction Not listed on the [facility]   Crestor  [Rosuvastatin ]     Other reaction(s): muscle pain Not listed on the [facility]   Diltiazem      Skin discoloration, lower extremity swelling Not listed on the [facility]   Erythromycin Other (See Comments)    Any meds with mycin in the name Unknown reaction Not listed on the [facility]   Lisinopril Other (See Comments)    REACTION: Cough   Metronidazole     Other reaction(s): mouth sores, tongue swelling   Morphine    Penicillins Cross Reactors Other (See Comments)    Any meds with mycin in the name Unknown reaction     Allergies as of 11/25/2023       Reactions   Morphine And Codeine Other (See Comments)   Patient states that she went crazy with this   Penicillins Swelling, Rash       Aspirin     Pacemaker   Clindamycin/lincomycin Other (See Comments)   Any meds with mycin in the name Unknown reaction Not listed on the [facility]   Crestor  [rosuvastatin ]    Other reaction(s): muscle pain Not listed on the [facility]   Diltiazem     Skin discoloration, lower extremity swelling Not listed on the [facility]   Erythromycin Other (See Comments)   Any meds with mycin in the name Unknown reaction Not listed on the [facility]   Lisinopril Other (See Comments)   REACTION: Cough   Metronidazole    Other reaction(s): mouth sores, tongue swelling   Morphine    Penicillins Cross Reactors Other (See Comments)   Any meds with mycin in the name Unknown reaction        Medication List        Accurate as of November 25, 2023 12:38 PM. If you have any questions, ask your nurse or doctor.          acetaminophen  500 MG tablet Commonly known as: TYLENOL  Take 2 tablets (1,000 mg total) by mouth every 8 (eight) hours as needed for moderate pain (pain score 4-6).   albuterol  (2.5 MG/3ML) 0.083% nebulizer solution Commonly known as: PROVENTIL  Take 3 mLs (2.5 mg total) by nebulization every 6 (six) hours as needed for wheezing or shortness of breath.   amiodarone  200  MG tablet Commonly known as: PACERONE  Take 200 mg by mouth daily.   amiodarone  200 MG tablet Commonly known as: PACERONE  Take 1 tablet (200 mg total) by mouth 2 (two) times daily for 7 days, THEN 1 tablet (200 mg total) daily for 7 days. Start taking on: November 12, 2023   apixaban  5 MG Tabs tablet Commonly known as: Eliquis  Take 1 tablet (5 mg total) by mouth 2 (two) times daily.   betamethasone  (augmented) 0.05 % gel Commonly known as: DIPROLENE  Apply 1 application  topically every 12 (twelve) hours as needed.   betamethasone  (augmented) 0.05 % gel Commonly known as: DIPROLENE  Apply topically daily.  Apply to scalp topically every 12 hours  as needed for psoriasis related to PSORIASIS, UNSPECIFIED (L40.9) apply to affected area of scalp   bisacodyl  10 MG suppository Commonly known as: DULCOLAX Place 1 suppository (10 mg total) rectally daily as needed for moderate constipation.   Calcium  600+D Plus Minerals 600-400 MG-UNIT Tabs Take 1 tablet by mouth daily.   Combigan  0.2-0.5 % ophthalmic solution Generic drug: brimonidine -timolol  Place 1 drop into the right eye every 12 (twelve) hours.   dextromethorphan -guaiFENesin  30-600 MG 12hr tablet Commonly known as: MUCINEX  DM Take 1 tablet by mouth 2 (two) times daily.   furosemide  20 MG tablet Commonly known as: LASIX  TAKE ONE TABLET BY MOUTH EVERY DAY   HM Lidocaine  Patch 4 % Generic drug: lidocaine  Place 1 patch onto the skin in the morning. Apply topically to L shoulder in the morning. Apply for 12 hours (0900) then removed for 12 hours (2100).   Ipratropium-Albuterol  20-100 MCG/ACT Aers respimat Commonly known as: COMBIVENT Inhale 2 puffs into the lungs every 6 (six) hours as needed for shortness of breath.   Iron  (Ferrous Sulfate ) 325 (65 Fe) MG Tabs Take 325 mg by mouth daily.   levothyroxine  100 MCG tablet Commonly known as: SYNTHROID  TAKE ONE TABLET BY MOUTH EVERY DAY IN THE MORNING   Multivitamin Adult Tabs Take  1 tablet by mouth in the morning.   mupirocin  ointment 2 % Commonly known as: BACTROBAN  Apply 1 Application topically 2 (two) times daily.   Namenda  10 MG tablet Generic drug: memantine  TAKE ONE TABLET BY MOUTH TWICE A DAY FOR COGNITIVE DECLINE   nystatin cream Commonly known as: MYCOSTATIN Apply 1 Application topically every 12 (twelve) hours as needed (for rash).   OCUVITE PO Take 1 capsule by mouth in the morning.   ondansetron  4 MG tablet Commonly known as: ZOFRAN  Take 1 tablet (4 mg total) by mouth every 6 (six) hours as needed for nausea.   oxyCODONE  5 MG immediate release tablet Commonly known as: Oxy IR/ROXICODONE  Take 5 mg by mouth 2 (two) times daily.   oxyCODONE  5 MG immediate release tablet Commonly known as: Roxicodone  Take 1 tablet (5 mg total) by mouth every 6 (six) hours as needed for severe pain (pain score 7-10).   pantoprazole 40 MG tablet Commonly known as: PROTONIX Take 40 mg by mouth daily as needed (for indigestion).   polyethylene glycol 17 g packet Commonly known as: MIRALAX  / GLYCOLAX  Take 17 g by mouth every other day.   potassium chloride  10 MEQ tablet Commonly known as: KLOR-CON  M Take 1 tablet (10 mEq total) by mouth daily.   Prevagen 10 MG Caps Generic drug: Apoaequorin Take 10 mg by mouth daily.   senna-docusate 8.6-50 MG tablet Commonly known as: Senokot-S Take 1 tablet by mouth at bedtime as needed for mild constipation.   sennosides-docusate sodium  8.6-50 MG tablet Commonly known as: SENOKOT-S Take 1 tablet by mouth daily.   Systane Hydration PF 0.4-0.3 % Soln Generic drug: Polyethyl Glyc-Propyl Glyc PF Place 1 drop into the right eye in the morning, at noon, in the evening, and at bedtime.   Voltaren  1 % Gel Generic drug: diclofenac  Sodium Apply 1 g topically 2 (two) times daily as needed (to L foot bunion deformity for pain).        Review of Systems  Unable to perform ROS: Dementia  Constitutional:  Negative for  fever.  Respiratory:  Negative for cough and shortness of breath.   Cardiovascular:  Negative for chest pain and leg swelling.  Gastrointestinal:  Negative  for abdominal distention, abdominal pain, blood in stool, constipation, diarrhea, nausea and vomiting.  Genitourinary:  Negative for dysuria.  Neurological:  Negative for dizziness.    Immunization History  Administered Date(s) Administered   Fluad Quad(high Dose 65+) 04/02/2022   Fluzone Influenza virus vaccine,trivalent (IIV3), split virus 02/24/2019, 03/08/2020, 03/25/2021   Influenza Split 03/11/2009, 03/11/2010, 02/25/2011   Influenza, High Dose Seasonal PF 03/08/2015, 04/03/2017, 03/17/2018, 03/08/2020, 04/15/2023   Influenza,inj,Quad PF,6+ Mos 03/08/2016   Influenza,inj,quad, With Preservative 03/11/2017   Moderna Covid-19 Vaccine  Bivalent Booster 88yrs & up 10/27/2021, 07/11/2023   Moderna SARS-COV2 Booster Vaccination 11/08/2020   Moderna Sars-Covid-2 Vaccination 06/15/2019, 07/13/2019, 04/19/2020, 02/27/2022   PNEUMOCOCCAL CONJUGATE-20 06/27/2023   Pfizer Covid-19 Vaccine Bivalent Booster 48yrs & up 02/28/2021, 04/12/2022   Pneumococcal Conjugate-13 12/06/2014   Pneumococcal Polysaccharide-23 05/13/2001, 03/11/2009   Polio, Unspecified 02/02/1958   Tdap 11/21/2011, 04/11/2023   Zoster Recombinant(Shingrix) 07/18/2022, 10/19/2022   Pertinent  Health Maintenance Due  Topic Date Due   INFLUENZA VACCINE  01/10/2024   DEXA SCAN  Completed      02/16/2022    9:45 AM 02/27/2022   11:41 AM 07/06/2022   10:04 AM 07/13/2022    3:55 PM 03/25/2023    4:51 PM  Fall Risk  Falls in the past year?   0 0 1  Was there an injury with Fall?   0 0 0  Fall Risk Category Calculator   0 0 1  (RETIRED) Patient Fall Risk Level High fall risk  High fall risk      Patient at Risk for Falls Due to  History of fall(s) No Fall Risks No Fall Risks History of fall(s);Impaired balance/gait;Impaired mobility  Fall risk Follow up  Falls evaluation  completed   Falls evaluation completed Falls evaluation completed;Falls prevention discussed;Education provided     Data saved with a previous flowsheet row definition   Functional Status Survey:    Vitals:   11/25/23 1211  BP: (!) 148/54  Pulse: 64  Resp: 18  Temp: 98.3 F (36.8 C)  SpO2: 95%  Weight: 196 lb 14.4 oz (89.3 kg)  Height: 5' 3 (1.6 m)   Body mass index is 34.88 kg/m. Physical Exam Constitutional:      Appearance: Normal appearance.  HENT:     Head: Normocephalic and atraumatic.   Cardiovascular:     Rate and Rhythm: Normal rate and regular rhythm.  Pulmonary:     Effort: Pulmonary effort is normal. No respiratory distress.     Breath sounds: Normal breath sounds. No wheezing.  Abdominal:     General: Bowel sounds are normal. There is no distension.     Tenderness: There is no abdominal tenderness. There is no guarding or rebound.     Comments:     Musculoskeletal:        General: No swelling.     Comments: RT incision site - slight redness along the incision Staples intact  No gaping  No drainage noted  No tenderness on palpation   Rt buttock pressure ulcer 1x 2 cm  Superficial ,  No drainage    Neurological:     Mental Status: She is alert. Mental status is at baseline.     Motor: No weakness.     Labs reviewed: Recent Labs    11/10/23 1820 11/11/23 0505 11/12/23 0608 11/19/23 0000  NA 141 139 138 136*  K 3.5 3.4* 4.9 4.0  CL 99 101 102 102  CO2 30 31 31  28*  GLUCOSE  123* 121* 105*  --   BUN 26* 27* 20 18  CREATININE 0.84 0.68 0.70 0.8  CALCIUM  8.4* 8.0* 8.1* 8.3*  MG 2.1 2.1 2.2  --   PHOS 2.0* 2.6 2.4*  --    Recent Labs    11/10/23 1820 11/11/23 0505 11/12/23 0608 11/19/23 0000  AST 31 30 32 16  ALT 17 16 18 9   ALKPHOS 39 36* 38 92  BILITOT 0.8 1.3* 1.7*  --   PROT 5.5* 5.1* 5.2*  --   ALBUMIN 2.7* 2.6* 2.6* 3.1*   Recent Labs    11/10/23 0605 11/10/23 1820 11/11/23 0505 11/12/23 0608 11/19/23 0000  11/22/23 1039  WBC 7.8  --  6.9 6.8 6.3 7.3  NEUTROABS 5.1  --  4.1 3.9  --   --   HGB 7.9*   < > 7.9* 7.6* 10.1* 11.4  HCT 24.2*   < > 24.7* 23.8* 32* 36.2  MCV 103.4*  --  105.6* 105.3*  --  105*  PLT 108*  --  126* 142* 202 240   < > = values in this interval not displayed.   Lab Results  Component Value Date   TSH 4.206 11/07/2023   Lab Results  Component Value Date   HGBA1C 4.8 11/08/2023   Lab Results  Component Value Date   CHOL 159 09/14/2023   HDL 42 09/14/2023   LDLCALC 100 (H) 09/14/2023   TRIG 85 09/14/2023   CHOLHDL 3.8 09/14/2023    Significant Diagnostic Results in last 30 days:  DG CHEST PORT 1 VIEW Result Date: 11/11/2023 CLINICAL DATA:  Shortness of breath and hypertension. EXAM: PORTABLE CHEST 1 VIEW COMPARISON:  11/10/2023 FINDINGS: There is a left chest wall pacer with leads in the right atrial appendage and right ventricle. Stable cardiomediastinal contours. Aortic atherosclerotic calcifications. Atelectasis versus airspace disease noted within the periphery of the left base. Visualized osseous structures appear grossly intact. IMPRESSION: Opacity within the periphery of the left base compatible with atelectasis versus airspace disease. Aortic Atherosclerosis (ICD10-I70.0). Electronically Signed   By: Kimberley Penman M.D.   On: 11/11/2023 08:07   DG CHEST PORT 1 VIEW Result Date: 11/10/2023 CLINICAL DATA:  Shortness of breath. EXAM: PORTABLE CHEST 1 VIEW COMPARISON:  11/09/2023 FINDINGS: The lungs are clear without focal pneumonia, edema, pneumothorax or pleural effusion. Cardiopericardial silhouette is at upper limits of normal for size. Degenerative changes noted in both shoulders. Left-sided permanent pacemaker again noted. Telemetry leads overlie the chest. IMPRESSION: No active disease. No features of pulmonary edema on the current study. Electronically Signed   By: Donnal Fusi M.D.   On: 11/10/2023 09:15   DG CHEST PORT 1 VIEW Result Date:  11/09/2023 CLINICAL DATA:  [id number] SOB (shortness of breath) [id number] EXAM: PORTABLE CHEST - 1 VIEW COMPARISON:  11/08/2023. FINDINGS: Cardiac silhouette is prominent. There is pulmonary interstitial prominence with vascular congestion. No focal consolidation. No pneumothorax or pleural effusion identified. Aorta is calcified. There is a left-sided pacer. IMPRESSION: Findings suggest CHF. Electronically Signed   By: Sydell Eva M.D.   On: 11/09/2023 08:38   DG CHEST PORT 1 VIEW Result Date: 11/08/2023 CLINICAL DATA:  Shortness of breath EXAM: PORTABLE CHEST 1 VIEW COMPARISON:  Chest x-Cathers 11/06/2023 FINDINGS: Enlarged cardiopericardial silhouette. Calcified aorta. Prominent central vascular congestion. Left upper chest pacemaker leads along the right side of the heart. Subtle opacity at the lung bases. Atelectasis is favored. Recommend follow-up. No pneumothorax. No effusion or consolidation. Films are under penetrated. IMPRESSION:  Enlarged cardiopericardial silhouette with vascular congestion and pacemaker. Subtle opacity at the bases, favor atelectasis. Recommend follow-up. Electronically Signed   By: Adrianna Horde M.D.   On: 11/08/2023 14:59   DG FEMUR, MIN 2 VIEWS RIGHT Result Date: 11/07/2023 CLINICAL DATA:  Elective surgery. EXAM: RIGHT FEMUR 2 VIEWS COMPARISON:  Preoperative imaging FINDINGS: Nine fluoroscopic spot views of the right femur submitted from the operating room. Femoral intramedullary nail with trans trochanteric and distal locking screw fixation of proximal femur fracture. Fluoroscopy time 176.7 seconds. Dose 71.17 mGy. IMPRESSION: Intraoperative fluoroscopy during proximal femur fracture fixation. Electronically Signed   By: Chadwick Colonel M.D.   On: 11/07/2023 20:22   DG C-Arm 1-60 Min-No Report Result Date: 11/07/2023 Fluoroscopy was utilized by the requesting physician.  No radiographic interpretation.   DG C-Arm 1-60 Min-No Report Result Date: 11/07/2023 Fluoroscopy was  utilized by the requesting physician.  No radiographic interpretation.   CT HEAD WO CONTRAST Result Date: 11/06/2023 CLINICAL DATA:  Head trauma, mechanical fall. EXAM: CT HEAD WITHOUT CONTRAST CT CERVICAL SPINE WITHOUT CONTRAST TECHNIQUE: Multidetector CT imaging of the head and cervical spine was performed following the standard protocol without intravenous contrast. Multiplanar CT image reconstructions of the cervical spine were also generated. RADIATION DOSE REDUCTION: This exam was performed according to the departmental dose-optimization program which includes automated exposure control, adjustment of the mA and/or kV according to patient size and/or use of iterative reconstruction technique. COMPARISON:  CT head 09/13/2023, CT cervical spine 09/25/2020. FINDINGS: CT HEAD FINDINGS Brain: No acute intracranial hemorrhage. No CT evidence of acute infarct. Nonspecific hypoattenuation in the periventricular and subcortical white matter favored to reflect chronic microvascular ischemic changes. Generalized parenchymal volume loss. No edema, mass effect, or midline shift. The basilar cisterns are patent. Ventricles: The ventricles are normal. Vascular: Atherosclerotic calcifications of the carotid siphons. No hyperdense vessel. Skull: No acute or aggressive finding. Orbits: Orbits are symmetric. Sinuses: Complete opacification of the left maxillary sinus. Mild mucosal thickening in the ethmoid sinuses and right sphenoid sinus. Other: Mastoid air cells are clear. CT CERVICAL SPINE FINDINGS Alignment: Straightening of the normal cervical lordosis. No significant listhesis. No facet subluxation or dislocation. Skull base and vertebrae: No acute fracture. No primary bone lesion or focal pathologic process. Soft tissues and spinal canal: No prevertebral fluid or swelling. No visible canal hematoma. Disc levels: Intervertebral disc space narrowing at multiple levels most pronounced at C4-5. Disc osteophyte complexes at  multiple levels. Disc bulge and thickening of the ligamentum flavum at C3-4 resulting in moderate spinal canal stenosis. Additional disc osteophyte complexes at C4-5 and C5-6 contributing to moderate spinal canal stenosis. Facet arthrosis and uncovertebral hypertrophy at multiple levels. Foraminal narrowing most pronounced on the left at C6-7. Upper chest: Negative. Other: None. IMPRESSION: No CT evidence of acute intracranial abnormality. No acute fracture or traumatic malalignment of the cervical spine. Degenerative changes as above. Moderate chronic microvascular ischemic changes and generalized parenchymal volume loss. Electronically Signed   By: Denny Flack M.D.   On: 11/06/2023 18:25   CT CERVICAL SPINE WO CONTRAST Result Date: 11/06/2023 CLINICAL DATA:  Head trauma, mechanical fall. EXAM: CT HEAD WITHOUT CONTRAST CT CERVICAL SPINE WITHOUT CONTRAST TECHNIQUE: Multidetector CT imaging of the head and cervical spine was performed following the standard protocol without intravenous contrast. Multiplanar CT image reconstructions of the cervical spine were also generated. RADIATION DOSE REDUCTION: This exam was performed according to the departmental dose-optimization program which includes automated exposure control, adjustment of the mA and/or kV  according to patient size and/or use of iterative reconstruction technique. COMPARISON:  CT head 09/13/2023, CT cervical spine 09/25/2020. FINDINGS: CT HEAD FINDINGS Brain: No acute intracranial hemorrhage. No CT evidence of acute infarct. Nonspecific hypoattenuation in the periventricular and subcortical white matter favored to reflect chronic microvascular ischemic changes. Generalized parenchymal volume loss. No edema, mass effect, or midline shift. The basilar cisterns are patent. Ventricles: The ventricles are normal. Vascular: Atherosclerotic calcifications of the carotid siphons. No hyperdense vessel. Skull: No acute or aggressive finding. Orbits: Orbits are  symmetric. Sinuses: Complete opacification of the left maxillary sinus. Mild mucosal thickening in the ethmoid sinuses and right sphenoid sinus. Other: Mastoid air cells are clear. CT CERVICAL SPINE FINDINGS Alignment: Straightening of the normal cervical lordosis. No significant listhesis. No facet subluxation or dislocation. Skull base and vertebrae: No acute fracture. No primary bone lesion or focal pathologic process. Soft tissues and spinal canal: No prevertebral fluid or swelling. No visible canal hematoma. Disc levels: Intervertebral disc space narrowing at multiple levels most pronounced at C4-5. Disc osteophyte complexes at multiple levels. Disc bulge and thickening of the ligamentum flavum at C3-4 resulting in moderate spinal canal stenosis. Additional disc osteophyte complexes at C4-5 and C5-6 contributing to moderate spinal canal stenosis. Facet arthrosis and uncovertebral hypertrophy at multiple levels. Foraminal narrowing most pronounced on the left at C6-7. Upper chest: Negative. Other: None. IMPRESSION: No CT evidence of acute intracranial abnormality. No acute fracture or traumatic malalignment of the cervical spine. Degenerative changes as above. Moderate chronic microvascular ischemic changes and generalized parenchymal volume loss. Electronically Signed   By: Denny Flack M.D.   On: 11/06/2023 18:25   CT PELVIS WO CONTRAST Result Date: 11/06/2023 CLINICAL DATA:  Hip trauma, fracture suspected, xray done Right hip pain. EXAM: CT OF THE PELVIS WITHOUT CONTRAST TECHNIQUE: Multidetector CT imaging of the pelvis was performed according to the standard protocol. Multiplanar CT image reconstructions were also generated. RADIATION DOSE REDUCTION: This exam was performed according to the departmental dose-optimization program which includes automated exposure control, adjustment of the mA and/or kV according to patient size and/or use of iterative reconstruction technique. COMPARISON:  Radiograph  earlier today FINDINGS: Bones/Joint/Cartilage Comminuted and displaced proximal femur fracture. There is a displaced component involving the lesser trochanter. There is a transverse subtrochanteric component. Fracture extends to involve the base of the femoral neck. No hip dislocation, the femoral head remains seated. Intact pubic rami. No sacral fracture. Minimal right hip joint effusion. Ligaments Suboptimally assessed by CT. Muscles and Tendons Mild edema in the skill a chair today shin to the fracture. No muscle atrophy. Soft tissues Colonic diverticulosis without diverticulitis. Stable 3.7 cm cystic structure in the left adnexa adjacent to the left aspect of the vaginal cuff from 2023 hip CT, likely benign given stability. There is a fat containing umbilical hernia. Aortic atherosclerosis. IMPRESSION: Comminuted and displaced proximal right femur fracture with a displaced component involving the lesser trochanter. There is a transverse subtrochanteric component. Fracture extends to involve the base of the femoral neck. Aortic Atherosclerosis (ICD10-I70.0). Electronically Signed   By: Chadwick Colonel M.D.   On: 11/06/2023 18:19   DG Hip Unilat With Pelvis 2-3 Views Right Result Date: 11/06/2023 CLINICAL DATA:  fall EXAM: DG HIP (WITH OR WITHOUT PELVIS) 2-3V RIGHT COMPARISON:  None Available. FINDINGS: Acute comminuted and markedly displaced right intertrochanteric femoral fracture. No right hip dislocation. Query slight irregularity of the left femoral neck. Poorly visualized left hip- limited evaluation due to overlapping osseous structures and  overlying soft tissues. view. No acute displaced fracture or diastasis of the bones of the pelvis. No acute displaced fractured diastasis of the bones of the pelvis. There is no evidence of severe arthropathy or other focal bone abnormality. IMPRESSION: Acute comminuted and markedly displaced right intertrochanteric femoral fracture. Poorly visualized left hip-  limited evaluation due to overlapping osseous structures and overlying soft tissues. Query slight irregularity of the left femoral neck. Recommend dedicated three view radiograph of the left hip for further evaluation. Electronically Signed   By: Morgane  Naveau M.D.   On: 11/06/2023 17:48   DG Chest Port 1 View Result Date: 11/06/2023 CLINICAL DATA:  fall EXAM: PORTABLE CHEST 1 VIEW COMPARISON:  Chest x-Vari 09/13/2023 FINDINGS: Left chest wall dual lead pacemaker. The heart and mediastinal contours are unchanged. Atherosclerotic plaque. Left base atelectasis. No focal consolidation. Chronic coarsened interstitial markings with no overt pulmonary edema. No pleural effusion. No pneumothorax. No acute osseous abnormality. IMPRESSION: 1. No active disease. 2.  Aortic Atherosclerosis (ICD10-I70.0). Electronically Signed   By: Morgane  Naveau M.D.   On: 11/06/2023 17:43    Assessment/Plan  Right hip fracture Status post intramedullary nailing of femur on 11/07/2023 On examination slight redness noted along the incision site.  Staples intact No gaping or drainage noted patient denied tenderness on palpation Follow-up with Ortho Will instruct nurse to use bacitracin cream on the incision site Monitor for drainage. Continue with oxycodone  5 mg twice daily Continue with Oxy 5 milligrams Q8 as needed Continue with PT/OT  History of A-fib Patient recently followed with cardiology Continue with amiodarone , Eliquis  No signs of bleeding  Major neurocognitive disorder Continue with supportive care Continue with assistance with ADLs Continue with Namenda  Monitor for delirium  History of chronic diastolic heart failure Lungs clear to auscultation Patient is able to speak in full sentences and does not appear to be in distress Continue with the Lasix   Constipation Continue with a bowel regiment  30 min Total time spent for obtaining history,  performing a medically appropriate examination and  evaluation, reviewing the tests,  documenting clinical information in the electronic or other health record,  ,care coordination (not separately reported)

## 2023-12-03 ENCOUNTER — Telehealth: Payer: Self-pay | Admitting: Orthopaedic Surgery

## 2023-12-03 NOTE — Telephone Encounter (Signed)
 [name] from [facility] called and ask if you wanted the nurse to take her sutures out. Her next appointment isn't til 7/23. CB#[phone]

## 2023-12-03 NOTE — Telephone Encounter (Signed)
 Called Rosina and gave her a verbal consent that she can remove the staples.

## 2023-12-06 ENCOUNTER — Encounter: Payer: Self-pay | Admitting: Nurse Practitioner

## 2023-12-06 ENCOUNTER — Non-Acute Institutional Stay (SKILLED_NURSING_FACILITY): Payer: Self-pay | Admitting: Nurse Practitioner

## 2023-12-06 DIAGNOSIS — D509 Iron deficiency anemia, unspecified: Secondary | ICD-10-CM | POA: Diagnosis not present

## 2023-12-06 DIAGNOSIS — I480 Paroxysmal atrial fibrillation: Secondary | ICD-10-CM

## 2023-12-06 DIAGNOSIS — I509 Heart failure, unspecified: Secondary | ICD-10-CM | POA: Diagnosis not present

## 2023-12-06 DIAGNOSIS — K219 Gastro-esophageal reflux disease without esophagitis: Secondary | ICD-10-CM

## 2023-12-06 DIAGNOSIS — M150 Primary generalized (osteo)arthritis: Secondary | ICD-10-CM | POA: Diagnosis not present

## 2023-12-06 DIAGNOSIS — I10 Essential (primary) hypertension: Secondary | ICD-10-CM

## 2023-12-06 DIAGNOSIS — F0390 Unspecified dementia without behavioral disturbance: Secondary | ICD-10-CM | POA: Diagnosis not present

## 2023-12-06 DIAGNOSIS — Z950 Presence of cardiac pacemaker: Secondary | ICD-10-CM

## 2023-12-06 DIAGNOSIS — K5901 Slow transit constipation: Secondary | ICD-10-CM | POA: Diagnosis not present

## 2023-12-06 DIAGNOSIS — N1831 Chronic kidney disease, stage 3a: Secondary | ICD-10-CM

## 2023-12-06 DIAGNOSIS — I4819 Other persistent atrial fibrillation: Secondary | ICD-10-CM | POA: Diagnosis not present

## 2023-12-06 NOTE — Assessment & Plan Note (Signed)
 Intermittent elevated systolic blood pressure in 160s, asymptomatic, off Metoprolol  05/27/23 2/2 low Bp, on Furosemide ,  f/u Cardiology. Bun/creat 20/0.7 11/12/23

## 2023-12-06 NOTE — Assessment & Plan Note (Signed)
 Compensated clinically,  AS, edema BLE, on Furosemide . EF 70-75% 09/19/21, 03/27/22 Venous US , negative DVT. 03/30/22 arterial US  mild to moderate obstructive disease BLE

## 2023-12-06 NOTE — Assessment & Plan Note (Signed)
 closed displaced intertrochanteric fx of R femur, s/p IM, healed.

## 2023-12-06 NOTE — Assessment & Plan Note (Signed)
 off Metoprolol , on Amiodarone , followed by Cardiology, on  Eliquis .

## 2023-12-06 NOTE — Assessment & Plan Note (Signed)
 Stable, taking Senokot S, MiraLax 

## 2023-12-06 NOTE — Assessment & Plan Note (Signed)
 Bun/creat 18/0.8 11/19/23

## 2023-12-06 NOTE — Assessment & Plan Note (Signed)
stable, on Pantoprazole  

## 2023-12-06 NOTE — Assessment & Plan Note (Signed)
 SSS,  f/u Cardiology

## 2023-12-06 NOTE — Assessment & Plan Note (Signed)
 tolerated Namenda well, helped with her mood as well. MMSE 27/30 04/09/23, CT head 02/12/22 Mild age-related atrophy and chronic microvascular ischemic changes.

## 2023-12-06 NOTE — Assessment & Plan Note (Signed)
 taking Levothyroxine , TSH 4.206 11/07/23

## 2023-12-06 NOTE — Assessment & Plan Note (Signed)
 Heart rate is controlled, off Metoprolol , on Amiodarone , followed by Cardiology, on  Eliquis .

## 2023-12-06 NOTE — Progress Notes (Signed)
 Location:   SNF [facility] Room Number: 15 Place of Service:  SNF (31) Provider: Larwance Makenna Macaluso NP  [name], MD  Patient Care Team: [name], MD as PCP - General (Internal Medicine) [name], MD as PCP - Electrophysiology (Cardiology) [name], FNP [facility])  Extended Emergency Contact Information Primary Emergency Contact: [facility] Phone: [phone] Mobile Phone: [phone] Relation: Daughter Interpreter needed? No Secondary Emergency Contact: Pearman,Karen Address: [address]          [place], [place] [address] [place] Home Phone: [phone] Mobile Phone: [phone] Relation: Daughter  Code Status:  DNR Goals of care: Advanced Directive information    11/25/2023   12:38 PM  Advanced Directives  Does Patient Have a Medical Advance Directive? Yes  Type of Estate agent of [place];Out of facility DNR (pink MOST or yellow form)  Does patient want to make changes to medical advance directive? No - Patient declined  Copy of Healthcare Power of Attorney in Chart? Yes - validated most recent copy scanned in chart (See row information)     Chief Complaint  Patient presents with   Medical Management of Chronic Issues    HPI:  Pt is a 89 y.o. female seen today for medical management of chronic diseases.      Hospitalized 11/06/23-11/12/23 for closed displaced intertrochanteric fx of R femur, s/p IM, WBAT. Hospital stay was complicated with acute blood loss anemia and acute respiratory failure with hypoxia.                BRBPR 05/23/23, FOBT positive 05/24/23, noted hemorrhoids, placed PPI, H+H wnl 11/22/23              01/11/23 family expressed concerning of hand and fingers tingling and numbness, desires checking TSH 4.38 01/26/23 If Namenda  is the culprit. The patient stated her tingling and numbness in hands and fingers has been the same, declined  workup or neurology referral.                           Chronic numbness of R+L hands, has been using fingerless gloves which helped, declined workup, neurology referral, or medication             Anemia, post op, on Iron , Hgb 11.4 11/22/23             Senile dementia, tolerated Namenda  well, helped with her mood as well. MMSE 27/30 04/09/23, CT head 02/12/22 Mild age-related atrophy and chronic microvascular ischemic changes.             Afib, off Metoprolol , on Amiodarone , followed by Cardiology, on  Eliquis .             AS moderate             HTN off Metoprolol  05/27/23 2/2 low Bp, on Furosemide ,  f/u Cardiology. Bun/creat 20/0.7 11/12/23             Pacemaker, SSS,  f/u Cardiology             CHF, AS, edema BLE, on Furosemide . EF 70-75% 09/19/21, 03/27/22 Venous US , negative DVT. 03/30/22 arterial US  mild to moderate obstructive disease BLE             CKD Bun/creat 18/0.8 11/19/23             Dysphagia, nectar liquid.  Hypothyroidism, taking Levothyroxine , TSH 4.206 11/07/23             OA, takes Tylenol , ambulates with walker prior to fx of R hip 11/06/23             GERD, stable, on Pantoprazole.              Constipation, taking Senokot S, MiraLax                   Past Medical History:  Diagnosis Date   Aortic stenosis 03/08/2016   Brady-tachy syndrome (HCC)    a. Biotronik dual chamber PPM implanted 2009 b. gen change to STJ dual chamber PPM 2017   Ejection fraction    EF 70%, echo, October, 2009  //   EF 55-60%, septal dyssynergy consistent with a paced rhythm, mild to moderate mitral regurgitation, echo, November, 2015    GERD (gastroesophageal reflux disease) 04/28/2014   Episodes November, 2015 with fluid refluxing from her esophagus.    Hair loss    Patient questioned Coumadin, changed toPradaxa   Hypertension    Hypothyroidism    Lower extremity edema 01/22/2018   Mitral regurgitation 05/03/2014   Mild-to-moderate mitral regurgitation, echo, November, 2015     Osteoarthritis of left knee 10/25/2017   Paroxysmal atrial fibrillation (HCC)    Episodes rapid atrial fib noted by pacemaker interrogation, August, 2011, diltiazem  added, patient improved. Patient continues on Rythmol   //   Changed to flecainide  2013  //   flecainide  level checked in 2013, good level    Persistent atrial fibrillation (HCC)    Presence of permanent cardiac pacemaker    Past Surgical History:  Procedure Laterality Date   ABDOMINAL HYSTERECTOMY     EP IMPLANTABLE DEVICE N/A 08/08/2015   Generator change with SJM Assurity DR PPM by Dr Kelsie   FEMUR IM NAIL Right 11/07/2023   Procedure: INSERTION, INTRAMEDULLARY ROD, FEMUR, RETROGRADE;  Surgeon: Genelle Standing, MD;  Location: [facility] OR;  Service: Orthopedics;  Laterality: Right;   JOINT REPLACEMENT     PACEMAKER PLACEMENT  2009             Review of Systems  Constitutional:  Negative for appetite change, fatigue and fever.  HENT:  Positive for hearing loss and trouble swallowing. Negative for congestion.   Eyes:  Negative for visual disturbance.  Respiratory:  Negative for cough, chest tightness and shortness of breath.   Cardiovascular:  Positive for leg swelling. Negative for chest pain and palpitations.  Gastrointestinal:  Negative for abdominal pain and constipation.  Genitourinary:  Negative for dysuria, frequency, hematuria and urgency.  Musculoskeletal:  Positive for arthralgias and gait problem.       Knees, L>R, intermittent in nature.  Left shoulder pain with ROM  Skin:  Negative for color change.       Chronic venous insufficiency skin changes: mild erythema in mid of R+L   Neurological:  Positive for numbness. Negative for tremors, speech difficulty, weakness and headaches.       Memory lapses. chronic numbness in fingers.   Psychiatric/Behavioral:  Negative for confusion and sleep disturbance. The patient is not nervous/anxious.     Immunization History  Administered Date(s) Administered   Fluad  Quad(high Dose 65+) 04/02/2022   Fluzone Influenza virus vaccine,trivalent (IIV3), split virus 02/24/2019, 03/08/2020, 03/25/2021   Influenza Split 03/11/2009, 03/11/2010, 02/25/2011   Influenza, High Dose Seasonal PF 03/08/2015, 04/03/2017, 03/17/2018, 03/08/2020, 04/15/2023   Influenza,inj,Quad PF,6+ Mos 03/08/2016   Influenza,inj,quad, With Preservative 03/11/2017  Moderna Covid-19 Vaccine  Bivalent Booster 71yrs & up 10/27/2021, 07/11/2023   Moderna SARS-COV2 Booster Vaccination 11/08/2020   Moderna Sars-Covid-2 Vaccination 06/15/2019, 07/13/2019, 04/19/2020, 02/27/2022   PNEUMOCOCCAL CONJUGATE-20 06/27/2023   Pfizer Covid-19 Vaccine Bivalent Booster 28yrs & up 02/28/2021, 04/12/2022   Pneumococcal Conjugate-13 12/06/2014   Pneumococcal Polysaccharide-23 05/13/2001, 03/11/2009   Polio, Unspecified 02/02/1958   Tdap 11/21/2011, 04/11/2023   Zoster Recombinant(Shingrix) 07/18/2022, 10/19/2022   Pertinent  Health Maintenance Due  Topic Date Due   INFLUENZA VACCINE  01/10/2024   DEXA SCAN  Completed      02/16/2022    9:45 AM 02/27/2022   11:41 AM 07/06/2022   10:04 AM 07/13/2022    3:55 PM 03/25/2023    4:51 PM  Fall Risk  Falls in the past year?   0 0 1  Was there an injury with Fall?   0 0 0  Fall Risk Category Calculator   0 0 1  (RETIRED) Patient Fall Risk Level High fall risk  High fall risk      Patient at Risk for Falls Due to  History of fall(s) No Fall Risks No Fall Risks History of fall(s);Impaired balance/gait;Impaired mobility  Fall risk Follow up  Falls evaluation completed   Falls evaluation completed Falls evaluation completed;Falls prevention discussed;Education provided     Data saved with a previous flowsheet row definition   Functional Status Survey:    Vitals:   12/06/23 1153 12/06/23 1624  BP: (!) 186/76 (!) 160/60  Pulse: 68   Resp: 16   Temp: (!) 96.9 F (36.1 C)   SpO2: 95%   Weight: 189 lb 8 oz (86 kg)    Body mass index is 33.57  kg/m. Physical Exam Vitals and nursing note reviewed.  Constitutional:      Appearance: Normal appearance.  HENT:     Head: Normocephalic and atraumatic.     Nose: Nose normal.     Mouth/Throat:     Mouth: Mucous membranes are moist.   Eyes:     Extraocular Movements: Extraocular movements intact.     Conjunctiva/sclera: Conjunctivae normal.     Pupils: Pupils are equal, round, and reactive to light.    Cardiovascular:     Rate and Rhythm: Normal rate. Rhythm irregular.     Heart sounds: Murmur heard.     Comments: No dorsalis pedis pulses felt from previous examination.  Pulmonary:     Effort: Pulmonary effort is normal.     Breath sounds: No rales.  Abdominal:     General: Bowel sounds are normal.     Palpations: Abdomen is soft.     Tenderness: There is no abdominal tenderness. There is no right CVA tenderness, left CVA tenderness, guarding or rebound.  Genitourinary:    Comments: External hemorrhoids from previous examination  Musculoskeletal:        General: Tenderness present. Normal range of motion.     Cervical back: Normal range of motion and neck supple.     Right lower leg: Edema present.     Left lower leg: Edema present.     Comments: Trace edema LLE, 1+ edema RLE Left shoulder pain with ROM R hip pain   Skin:    General: Skin is warm and dry.     Findings: Erythema present.     Comments: Mild dark erythema mid of R+L lower legs, chronic.   Skin tear RLE near healed. R hip surgical scar    Neurological:     General: No  focal deficit present.     Mental Status: She is alert and oriented to person, place, and time. Mental status is at baseline.     Gait: Gait abnormal.     Comments: 5/5 muscle strength in fingers.   Psychiatric:        Mood and Affect: Mood normal.        Behavior: Behavior normal.     Labs reviewed: Recent Labs    11/10/23 1820 11/11/23 0505 11/12/23 0608 11/19/23 0000  NA 141 139 138 136*  K 3.5 3.4* 4.9 4.0  CL 99 101  102 102  CO2 30 31 31  28*  GLUCOSE 123* 121* 105*  --   BUN 26* 27* 20 18  CREATININE 0.84 0.68 0.70 0.8  CALCIUM  8.4* 8.0* 8.1* 8.3*  MG 2.1 2.1 2.2  --   PHOS 2.0* 2.6 2.4*  --    Recent Labs    11/10/23 1820 11/11/23 0505 11/12/23 0608 11/19/23 0000  AST 31 30 32 16  ALT 17 16 18 9   ALKPHOS 39 36* 38 92  BILITOT 0.8 1.3* 1.7*  --   PROT 5.5* 5.1* 5.2*  --   ALBUMIN 2.7* 2.6* 2.6* 3.1*   Recent Labs    11/10/23 0605 11/10/23 1820 11/11/23 0505 11/12/23 0608 11/19/23 0000 11/22/23 1039  WBC 7.8  --  6.9 6.8 6.3 7.3  NEUTROABS 5.1  --  4.1 3.9  --   --   HGB 7.9*   < > 7.9* 7.6* 10.1* 11.4  HCT 24.2*   < > 24.7* 23.8* 32* 36.2  MCV 103.4*  --  105.6* 105.3*  --  105*  PLT 108*  --  126* 142* 202 240   < > = values in this interval not displayed.   Lab Results  Component Value Date   TSH 4.206 11/07/2023   Lab Results  Component Value Date   HGBA1C 4.8 11/08/2023   Lab Results  Component Value Date   CHOL 159 09/14/2023   HDL 42 09/14/2023   LDLCALC 100 (H) 09/14/2023   TRIG 85 09/14/2023   CHOLHDL 3.8 09/14/2023    Significant Diagnostic Results in last 30 days:  DG FEMUR, MIN 2 VIEWS RIGHT Result Date: 11/28/2023 CLINICAL DATA:  Pain EXAM: RIGHT FEMUR 2 VIEWS COMPARISON:  11/07/2023, 11/06/2023 FINDINGS: Previous right knee replacement. Status post intramedullary rod and distal screw fixation of the femur for comminuted right intertrochanteric fracture. Grossly stable alignment of hardware compared with intraoperative spot views. Displaced lesser trochanteric fracture fragment. No definitive bridging callus. Vascular calcifications IMPRESSION: Status post ORIF of right intertrochanteric fracture with grossly stable fracture alignment and appearance of hardware compared with intraoperative limited spot views. Electronically Signed   By: Luke Bun M.D.   On: 11/28/2023 23:59   DG CHEST PORT 1 VIEW Result Date: 11/11/2023 CLINICAL DATA:  Shortness of  breath and hypertension. EXAM: PORTABLE CHEST 1 VIEW COMPARISON:  11/10/2023 FINDINGS: There is a left chest wall pacer with leads in the right atrial appendage and right ventricle. Stable cardiomediastinal contours. Aortic atherosclerotic calcifications. Atelectasis versus airspace disease noted within the periphery of the left base. Visualized osseous structures appear grossly intact. IMPRESSION: Opacity within the periphery of the left base compatible with atelectasis versus airspace disease. Aortic Atherosclerosis (ICD10-I70.0). Electronically Signed   By: Waddell Calk M.D.   On: 11/11/2023 08:07   DG CHEST PORT 1 VIEW Result Date: 11/10/2023 CLINICAL DATA:  Shortness of breath. EXAM: PORTABLE CHEST 1 VIEW COMPARISON:  11/09/2023 FINDINGS: The lungs are clear without focal pneumonia, edema, pneumothorax or pleural effusion. Cardiopericardial silhouette is at upper limits of normal for size. Degenerative changes noted in both shoulders. Left-sided permanent pacemaker again noted. Telemetry leads overlie the chest. IMPRESSION: No active disease. No features of pulmonary edema on the current study. Electronically Signed   By: Camellia Candle M.D.   On: 11/10/2023 09:15   DG CHEST PORT 1 VIEW Result Date: 11/09/2023 CLINICAL DATA:  [id number] SOB (shortness of breath) [id number] EXAM: PORTABLE CHEST - 1 VIEW COMPARISON:  11/08/2023. FINDINGS: Cardiac silhouette is prominent. There is pulmonary interstitial prominence with vascular congestion. No focal consolidation. No pneumothorax or pleural effusion identified. Aorta is calcified. There is a left-sided pacer. IMPRESSION: Findings suggest CHF. Electronically Signed   By: Fonda Field M.D.   On: 11/09/2023 08:38   DG CHEST PORT 1 VIEW Result Date: 11/08/2023 CLINICAL DATA:  Shortness of breath EXAM: PORTABLE CHEST 1 VIEW COMPARISON:  Chest x-Conti 11/06/2023 FINDINGS: Enlarged cardiopericardial silhouette. Calcified aorta. Prominent central vascular congestion.  Left upper chest pacemaker leads along the right side of the heart. Subtle opacity at the lung bases. Atelectasis is favored. Recommend follow-up. No pneumothorax. No effusion or consolidation. Films are under penetrated. IMPRESSION: Enlarged cardiopericardial silhouette with vascular congestion and pacemaker. Subtle opacity at the bases, favor atelectasis. Recommend follow-up. Electronically Signed   By: Ranell Bring M.D.   On: 11/08/2023 14:59   DG FEMUR, MIN 2 VIEWS RIGHT Result Date: 11/07/2023 CLINICAL DATA:  Elective surgery. EXAM: RIGHT FEMUR 2 VIEWS COMPARISON:  Preoperative imaging FINDINGS: Nine fluoroscopic spot views of the right femur submitted from the operating room. Femoral intramedullary nail with trans trochanteric and distal locking screw fixation of proximal femur fracture. Fluoroscopy time 176.7 seconds. Dose 71.17 mGy. IMPRESSION: Intraoperative fluoroscopy during proximal femur fracture fixation. Electronically Signed   By: Andrea Gasman M.D.   On: 11/07/2023 20:22   DG C-Arm 1-60 Min-No Report Result Date: 11/07/2023 Fluoroscopy was utilized by the requesting physician.  No radiographic interpretation.   DG C-Arm 1-60 Min-No Report Result Date: 11/07/2023 Fluoroscopy was utilized by the requesting physician.  No radiographic interpretation.   CT HEAD WO CONTRAST Result Date: 11/06/2023 CLINICAL DATA:  Head trauma, mechanical fall. EXAM: CT HEAD WITHOUT CONTRAST CT CERVICAL SPINE WITHOUT CONTRAST TECHNIQUE: Multidetector CT imaging of the head and cervical spine was performed following the standard protocol without intravenous contrast. Multiplanar CT image reconstructions of the cervical spine were also generated. RADIATION DOSE REDUCTION: This exam was performed according to the departmental dose-optimization program which includes automated exposure control, adjustment of the mA and/or kV according to patient size and/or use of iterative reconstruction technique. COMPARISON:   CT head 09/13/2023, CT cervical spine 09/25/2020. FINDINGS: CT HEAD FINDINGS Brain: No acute intracranial hemorrhage. No CT evidence of acute infarct. Nonspecific hypoattenuation in the periventricular and subcortical white matter favored to reflect chronic microvascular ischemic changes. Generalized parenchymal volume loss. No edema, mass effect, or midline shift. The basilar cisterns are patent. Ventricles: The ventricles are normal. Vascular: Atherosclerotic calcifications of the carotid siphons. No hyperdense vessel. Skull: No acute or aggressive finding. Orbits: Orbits are symmetric. Sinuses: Complete opacification of the left maxillary sinus. Mild mucosal thickening in the ethmoid sinuses and right sphenoid sinus. Other: Mastoid air cells are clear. CT CERVICAL SPINE FINDINGS Alignment: Straightening of the normal cervical lordosis. No significant listhesis. No facet subluxation or dislocation. Skull base and vertebrae: No acute fracture. No primary bone lesion or focal pathologic  process. Soft tissues and spinal canal: No prevertebral fluid or swelling. No visible canal hematoma. Disc levels: Intervertebral disc space narrowing at multiple levels most pronounced at C4-5. Disc osteophyte complexes at multiple levels. Disc bulge and thickening of the ligamentum flavum at C3-4 resulting in moderate spinal canal stenosis. Additional disc osteophyte complexes at C4-5 and C5-6 contributing to moderate spinal canal stenosis. Facet arthrosis and uncovertebral hypertrophy at multiple levels. Foraminal narrowing most pronounced on the left at C6-7. Upper chest: Negative. Other: None. IMPRESSION: No CT evidence of acute intracranial abnormality. No acute fracture or traumatic malalignment of the cervical spine. Degenerative changes as above. Moderate chronic microvascular ischemic changes and generalized parenchymal volume loss. Electronically Signed   By: Donnice Mania M.D.   On: 11/06/2023 18:25   CT CERVICAL SPINE  WO CONTRAST Result Date: 11/06/2023 CLINICAL DATA:  Head trauma, mechanical fall. EXAM: CT HEAD WITHOUT CONTRAST CT CERVICAL SPINE WITHOUT CONTRAST TECHNIQUE: Multidetector CT imaging of the head and cervical spine was performed following the standard protocol without intravenous contrast. Multiplanar CT image reconstructions of the cervical spine were also generated. RADIATION DOSE REDUCTION: This exam was performed according to the departmental dose-optimization program which includes automated exposure control, adjustment of the mA and/or kV according to patient size and/or use of iterative reconstruction technique. COMPARISON:  CT head 09/13/2023, CT cervical spine 09/25/2020. FINDINGS: CT HEAD FINDINGS Brain: No acute intracranial hemorrhage. No CT evidence of acute infarct. Nonspecific hypoattenuation in the periventricular and subcortical white matter favored to reflect chronic microvascular ischemic changes. Generalized parenchymal volume loss. No edema, mass effect, or midline shift. The basilar cisterns are patent. Ventricles: The ventricles are normal. Vascular: Atherosclerotic calcifications of the carotid siphons. No hyperdense vessel. Skull: No acute or aggressive finding. Orbits: Orbits are symmetric. Sinuses: Complete opacification of the left maxillary sinus. Mild mucosal thickening in the ethmoid sinuses and right sphenoid sinus. Other: Mastoid air cells are clear. CT CERVICAL SPINE FINDINGS Alignment: Straightening of the normal cervical lordosis. No significant listhesis. No facet subluxation or dislocation. Skull base and vertebrae: No acute fracture. No primary bone lesion or focal pathologic process. Soft tissues and spinal canal: No prevertebral fluid or swelling. No visible canal hematoma. Disc levels: Intervertebral disc space narrowing at multiple levels most pronounced at C4-5. Disc osteophyte complexes at multiple levels. Disc bulge and thickening of the ligamentum flavum at C3-4  resulting in moderate spinal canal stenosis. Additional disc osteophyte complexes at C4-5 and C5-6 contributing to moderate spinal canal stenosis. Facet arthrosis and uncovertebral hypertrophy at multiple levels. Foraminal narrowing most pronounced on the left at C6-7. Upper chest: Negative. Other: None. IMPRESSION: No CT evidence of acute intracranial abnormality. No acute fracture or traumatic malalignment of the cervical spine. Degenerative changes as above. Moderate chronic microvascular ischemic changes and generalized parenchymal volume loss. Electronically Signed   By: Donnice Mania M.D.   On: 11/06/2023 18:25   CT PELVIS WO CONTRAST Result Date: 11/06/2023 CLINICAL DATA:  Hip trauma, fracture suspected, xray done Right hip pain. EXAM: CT OF THE PELVIS WITHOUT CONTRAST TECHNIQUE: Multidetector CT imaging of the pelvis was performed according to the standard protocol. Multiplanar CT image reconstructions were also generated. RADIATION DOSE REDUCTION: This exam was performed according to the departmental dose-optimization program which includes automated exposure control, adjustment of the mA and/or kV according to patient size and/or use of iterative reconstruction technique. COMPARISON:  Radiograph earlier today FINDINGS: Bones/Joint/Cartilage Comminuted and displaced proximal femur fracture. There is a displaced component involving the lesser  trochanter. There is a transverse subtrochanteric component. Fracture extends to involve the base of the femoral neck. No hip dislocation, the femoral head remains seated. Intact pubic rami. No sacral fracture. Minimal right hip joint effusion. Ligaments Suboptimally assessed by CT. Muscles and Tendons Mild edema in the skill a chair today shin to the fracture. No muscle atrophy. Soft tissues Colonic diverticulosis without diverticulitis. Stable 3.7 cm cystic structure in the left adnexa adjacent to the left aspect of the vaginal cuff from 2023 hip CT, likely benign  given stability. There is a fat containing umbilical hernia. Aortic atherosclerosis. IMPRESSION: Comminuted and displaced proximal right femur fracture with a displaced component involving the lesser trochanter. There is a transverse subtrochanteric component. Fracture extends to involve the base of the femoral neck. Aortic Atherosclerosis (ICD10-I70.0). Electronically Signed   By: Andrea Gasman M.D.   On: 11/06/2023 18:19   DG Hip Unilat With Pelvis 2-3 Views Right Result Date: 11/06/2023 CLINICAL DATA:  fall EXAM: DG HIP (WITH OR WITHOUT PELVIS) 2-3V RIGHT COMPARISON:  None Available. FINDINGS: Acute comminuted and markedly displaced right intertrochanteric femoral fracture. No right hip dislocation. Query slight irregularity of the left femoral neck. Poorly visualized left hip- limited evaluation due to overlapping osseous structures and overlying soft tissues. view. No acute displaced fracture or diastasis of the bones of the pelvis. No acute displaced fractured diastasis of the bones of the pelvis. There is no evidence of severe arthropathy or other focal bone abnormality. IMPRESSION: Acute comminuted and markedly displaced right intertrochanteric femoral fracture. Poorly visualized left hip- limited evaluation due to overlapping osseous structures and overlying soft tissues. Query slight irregularity of the left femoral neck. Recommend dedicated three view radiograph of the left hip for further evaluation. Electronically Signed   By: Morgane  Naveau M.D.   On: 11/06/2023 17:48   DG Chest Port 1 View Result Date: 11/06/2023 CLINICAL DATA:  fall EXAM: PORTABLE CHEST 1 VIEW COMPARISON:  Chest x-[place] 09/13/2023 FINDINGS: Left chest wall dual lead pacemaker. The heart and mediastinal contours are unchanged. Atherosclerotic plaque. Left base atelectasis. No focal consolidation. Chronic coarsened interstitial markings with no overt pulmonary edema. No pleural effusion. No pneumothorax. No acute osseous  abnormality. IMPRESSION: 1. No active disease. 2.  Aortic Atherosclerosis (ICD10-I70.0). Electronically Signed   By: Morgane  Naveau M.D.   On: 11/06/2023 17:43    Assessment/Plan  Osteoarthritis, multiple sites closed displaced intertrochanteric fx of R femur, s/p IM, healed.   IDA (iron  deficiency anemia) post op, on Iron , Hgb 11.4 11/22/23  Major neurocognitive disorder (HCC) tolerated Namenda  well, helped with her mood as well. MMSE 27/30 04/09/23, CT head 02/12/22 Mild age-related atrophy and chronic microvascular ischemic changes.  Persistent atrial fibrillation (HCC) off Metoprolol , on Amiodarone , followed by Cardiology, on  Eliquis .  CKD (chronic kidney disease) stage 3, GFR 30-59 ml/min (HCC) Bun/creat 18/0.8 11/19/23  Hypothyroidism taking Levothyroxine , TSH 4.206 11/07/23  Paroxysmal atrial fibrillation (HCC) Heart rate is controlled, off Metoprolol , on Amiodarone , followed by Cardiology, on  Eliquis .  Hypertension Intermittent elevated systolic blood pressure in 160s, asymptomatic, off Metoprolol  05/27/23 2/2 low Bp, on Furosemide ,  f/u Cardiology. Bun/creat 20/0.7 11/12/23  Pacemaker SSS,  f/u Cardiology  Congestive heart failure (HCC) Compensated clinically,  AS, edema BLE, on Furosemide . EF 70-75% 09/19/21, 03/27/22 Venous US , negative DVT. 03/30/22 arterial US  mild to moderate obstructive disease BLE  GERD (gastroesophageal reflux disease)  stable, on Pantoprazole.   Slow transit constipation Stable, taking Senokot S, MiraLax    Family/ staff Communication: plan  of care reviewed with the patient  Labs/tests ordered:  none

## 2023-12-06 NOTE — Assessment & Plan Note (Signed)
 post op, on Iron , Hgb 11.4 11/22/23

## 2023-12-09 ENCOUNTER — Non-Acute Institutional Stay (SKILLED_NURSING_FACILITY): Payer: Self-pay | Admitting: Sports Medicine

## 2023-12-09 ENCOUNTER — Encounter: Payer: Self-pay | Admitting: Sports Medicine

## 2023-12-09 DIAGNOSIS — T148XXA Other injury of unspecified body region, initial encounter: Secondary | ICD-10-CM

## 2023-12-09 DIAGNOSIS — I480 Paroxysmal atrial fibrillation: Secondary | ICD-10-CM

## 2023-12-09 DIAGNOSIS — I509 Heart failure, unspecified: Secondary | ICD-10-CM | POA: Diagnosis not present

## 2023-12-09 DIAGNOSIS — S72001S Fracture of unspecified part of neck of right femur, sequela: Secondary | ICD-10-CM

## 2023-12-09 NOTE — Progress Notes (Unsigned)
 Location:  [facility]  [facility]ing Home Room Number: 15A Place of Service:  SNF ([phone]) Provider: [name], MD   [name], MD  Patient Care Team: [name], MD as PCP - General (Internal Medicine) [name], MD as PCP - Electrophysiology (Cardiology) [name], FNP (Hospice and Palliative Medicine)  Extended Emergency Contact Information Primary Emergency Contact: [facility] Phone: [phone] Mobile Phone: [phone] Relation: Daughter Interpreter needed? No Secondary Emergency Contact: Pearman,Karen Address: [address]          [place], [place] [address] [place] Home Phone: [phone] Mobile Phone: [phone] Relation: Daughter  Code Status:  DNR  Goals of care: Advanced Directive information    12/09/2023   10:55 AM  Advanced Directives  Does Patient Have a Medical Advance Directive? Yes  Type of Estate agent of [place];Out of facility DNR (pink MOST or yellow form)  Does patient want to make changes to medical advance directive? No - Patient declined  Copy of Healthcare Power of Attorney in Chart? Yes - validated most recent copy scanned in chart (See row information)     Chief Complaint  Patient presents with   Acute Visit    Pressure ulcer on right heel.     HPI:  Pt is a 89 y.o. female with PMH of A fib, IDA, Hypothyroidism, dementia, OA is seen today for  acute visit for pressure ulcer on Rt heel  Pt seen and examined in her room, she is sitting in her recliner chair Seems pleasant and comfortable, does not appear to be in distress. Pt is requiring 1 person assistance with transfers and needs limited assistance for ADLS  Pt c/o pain in her Rt heel, states its tender to touch   Pt denies chest pain, sob, palpitations, abdominal pain,     Past Medical History:  Diagnosis Date   Aortic stenosis 03/08/2016   Brady-tachy syndrome (HCC)    a.  Biotronik dual chamber PPM implanted 2009 b. gen change to STJ dual chamber PPM 2017   Ejection fraction    EF 70%, echo, October, 2009  //   EF 55-60%, septal dyssynergy consistent with a paced rhythm, mild to moderate mitral regurgitation, echo, November, 2015    GERD (gastroesophageal reflux disease) 04/28/2014   Episodes November, 2015 with fluid refluxing from her esophagus.    Hair loss    Patient questioned Coumadin, changed toPradaxa   Hypertension    Hypothyroidism    Lower extremity edema 01/22/2018   Mitral regurgitation 05/03/2014   Mild-to-moderate mitral regurgitation, echo, November, 2015    Osteoarthritis of left knee 10/25/2017   Paroxysmal atrial fibrillation (HCC)    Episodes rapid atrial fib noted by pacemaker interrogation, August, 2011, diltiazem  added, patient improved. Patient continues on Rythmol   //   Changed to flecainide  2013  //   flecainide  level checked in 2013, good level    Persistent atrial fibrillation (HCC)    Presence of permanent cardiac pacemaker    Past Surgical History:  Procedure Laterality Date   ABDOMINAL HYSTERECTOMY     EP IMPLANTABLE DEVICE N/A 08/08/2015   Generator change with SJM Assurity DR PPM by Dr Kelsie   FEMUR IM NAIL Right 11/07/2023   Procedure: INSERTION, INTRAMEDULLARY ROD, FEMUR, RETROGRADE;  Surgeon: Genelle Standing, MD;  Location: [facility] OR;  Service: Orthopedics;  Laterality: Right;   JOINT REPLACEMENT     PACEMAKER PLACEMENT  2009        Allergies  Allergen  Reactions   Morphine And Codeine Other (See Comments)    Patient states that she went crazy with this   Penicillins Swelling and Rash        Aspirin      Pacemaker   Clindamycin/Lincomycin Other (See Comments)    Any meds with mycin in the name Unknown reaction Not listed on the [facility]   Crestor  [Rosuvastatin ]     Other reaction(s): muscle pain Not listed on the [facility]   Diltiazem      Skin discoloration, lower extremity swelling Not listed on the [facility]    Erythromycin Other (See Comments)    Any meds with mycin in the name Unknown reaction Not listed on the [facility]   Lisinopril Other (See Comments)    REACTION: Cough   Metronidazole     Other reaction(s): mouth sores, tongue swelling   Morphine    Penicillins Cross Reactors Other (See Comments)    Any meds with mycin in the name Unknown reaction    Allergies as of 12/09/2023       Reactions                  Aspirin     Pacemaker   Clindamycin/lincomycin Other (See Comments)   Any meds with mycin in the name Unknown reaction Not listed on the [facility]   Crestor  [rosuvastatin ]    Other reaction(s): muscle pain Not listed on the [facility]   Diltiazem     Skin discoloration, lower extremity swelling Not listed on the [facility]   Erythromycin Other (See Comments)   Any meds with mycin in the name Unknown reaction Not listed on the [facility]   Lisinopril Other (See Comments)   REACTION: Cough   Metronidazole    Other reaction(s): mouth sores, tongue swelling   Morphine    Penicillins Cross Reactors Other (See Comments)   Any meds with mycin in the name Unknown reaction        Medication List        Accurate as of December 09, 2023 11:40 AM. If you have any questions, ask your nurse or doctor.          acetaminophen  500 MG tablet Commonly known as: TYLENOL  Take 2 tablets (1,000 mg total) by mouth every 8 (eight) hours as needed for moderate pain (pain score 4-6).   albuterol  (2.5 MG/3ML) 0.083% nebulizer solution Commonly known as: PROVENTIL  Take 3 mLs (2.5 mg total) by nebulization every 6 (six) hours as needed for wheezing or shortness of breath.   amiodarone  200 MG tablet Commonly known as: PACERONE  Take 200 mg by mouth daily.   amiodarone  200 MG tablet Commonly known as: PACERONE  Take 1 tablet (200 mg total) by mouth 2 (two) times daily for 7 days, THEN 1 tablet (200 mg total) daily for 7 days. Start taking on: November 12, 2023   apixaban  5 MG Tabs tablet Commonly known as:  Eliquis  Take 1 tablet (5 mg total) by mouth 2 (two) times daily.   betamethasone  (augmented) 0.05 % gel Commonly known as: DIPROLENE  Apply 1 application  topically every 12 (twelve) hours as needed.   betamethasone  (augmented) 0.05 % gel Commonly known as: DIPROLENE  Apply topically daily.  Apply to scalp topically every 12 hours as needed for psoriasis related to PSORIASIS, UNSPECIFIED (L40.9) apply to affected area of scalp   bisacodyl  10 MG suppository Commonly known as: DULCOLAX Place 1 suppository (10 mg total) rectally daily as needed for moderate constipation.   Calcium  600+D Plus Minerals 600-400 MG-UNIT Tabs  Take 1 tablet by mouth daily.   Combigan  0.2-0.5 % ophthalmic solution Generic drug: brimonidine -timolol  Place 1 drop into the right eye every 12 (twelve) hours.   dextromethorphan -guaiFENesin  30-600 MG 12hr tablet Commonly known as: MUCINEX  DM Take 1 tablet by mouth 2 (two) times daily.   furosemide  20 MG tablet Commonly known as: LASIX  TAKE ONE TABLET BY MOUTH EVERY DAY   HM Lidocaine  Patch 4 % Generic drug: lidocaine  Place 1 patch onto the skin in the morning. Apply topically to L shoulder in the morning. Apply for 12 hours (0900) then removed for 12 hours (2100).   Ipratropium-Albuterol  20-100 MCG/ACT Aers respimat Commonly known as: COMBIVENT Inhale 2 puffs into the lungs every 6 (six) hours as needed for shortness of breath.   Iron  (Ferrous Sulfate ) 325 (65 Fe) MG Tabs Take 325 mg by mouth daily.   levothyroxine  100 MCG tablet Commonly known as: SYNTHROID  TAKE ONE TABLET BY MOUTH EVERY DAY IN THE MORNING   Multivitamin Adult Tabs Take 1 tablet by mouth in the morning.   mupirocin  ointment 2 % Commonly known as: BACTROBAN  Apply 1 Application topically 2 (two) times daily.   Namenda  10 MG tablet Generic drug: memantine  TAKE ONE TABLET BY MOUTH TWICE A DAY FOR COGNITIVE DECLINE   nystatin cream Commonly known as: MYCOSTATIN Apply 1 Application  topically every 12 (twelve) hours as needed (for rash).   OCUVITE PO Take 1 capsule by mouth in the morning.   ondansetron  4 MG tablet Commonly known as: ZOFRAN  Take 1 tablet (4 mg total) by mouth every 6 (six) hours as needed for nausea.   oxyCODONE  5 MG immediate release tablet Commonly known as: Oxy IR/ROXICODONE  Take 5 mg by mouth 2 (two) times daily.   oxyCODONE  5 MG immediate release tablet Commonly known as: Roxicodone  Take 1 tablet (5 mg total) by mouth every 6 (six) hours as needed for severe pain (pain score 7-10).   pantoprazole 40 MG tablet Commonly known as: PROTONIX Take 40 mg by mouth daily as needed (for indigestion).   polyethylene glycol 17 g packet Commonly known as: MIRALAX  / GLYCOLAX  Take 17 g by mouth every other day.   potassium chloride  10 MEQ tablet Commonly known as: KLOR-CON  M Take 1 tablet (10 mEq total) by mouth daily.   Prevagen 10 MG Caps Generic drug: Apoaequorin Take 10 mg by mouth daily.   senna-docusate 8.6-50 MG tablet Commonly known as: Senokot-S Take 1 tablet by mouth at bedtime as needed for mild constipation.   sennosides-docusate sodium  8.6-50 MG tablet Commonly known as: SENOKOT-S Take 1 tablet by mouth daily.   Systane Hydration PF 0.4-0.3 % Soln Generic drug: Polyethyl Glyc-Propyl Glyc PF Place 1 drop into the right eye in the morning, at noon, in the evening, and at bedtime.   Voltaren  1 % Gel Generic drug: diclofenac  Sodium Apply 1 g topically 2 (two) times daily as needed (to L foot bunion deformity for pain).        Review of Systems  Constitutional:  Negative for fever.  Respiratory:  Negative for cough, shortness of breath and wheezing.   Cardiovascular:  Negative for chest pain and leg swelling.  Gastrointestinal:  Negative for abdominal distention, abdominal pain, blood in stool, constipation, diarrhea, nausea and vomiting.  Genitourinary:  Negative for dysuria.  Neurological:  Negative for dizziness.     Immunization History  Administered Date(s) Administered   Fluad Quad(high Dose 65+) 04/02/2022   Fluzone Influenza virus vaccine,trivalent (IIV3), split virus 02/24/2019, 03/08/2020,  03/25/2021   Influenza Split 03/11/2009, 03/11/2010, 02/25/2011   Influenza, High Dose Seasonal PF 03/08/2015, 04/03/2017, 03/17/2018, 03/08/2020, 04/15/2023   Influenza,inj,Quad PF,6+ Mos 03/08/2016   Influenza,inj,quad, With Preservative 03/11/2017   Moderna Covid-19 Vaccine  Bivalent Booster 71yrs & up 10/27/2021, 07/11/2023   Moderna SARS-COV2 Booster Vaccination 11/08/2020   Moderna Sars-Covid-2 Vaccination 06/15/2019, 07/13/2019, 04/19/2020, 02/27/2022   PNEUMOCOCCAL CONJUGATE-20 06/27/2023   Pfizer Covid-19 Vaccine Bivalent Booster 23yrs & up 02/28/2021, 04/12/2022   Pneumococcal Conjugate-13 12/06/2014   Pneumococcal Polysaccharide-23 05/13/2001, 03/11/2009   Polio, Unspecified 02/02/1958   Tdap 11/21/2011, 04/11/2023   Zoster Recombinant(Shingrix) 07/18/2022, 10/19/2022   Pertinent  Health Maintenance Due  Topic Date Due   INFLUENZA VACCINE  01/10/2024   DEXA SCAN  Completed      02/16/2022    9:45 AM 02/27/2022   11:41 AM 07/06/2022   10:04 AM 07/13/2022    3:55 PM 03/25/2023    4:51 PM  Fall Risk  Falls in the past year?   0 0 1  Was there an injury with Fall?   0 0 0  Fall Risk Category Calculator   0 0 1  (RETIRED) Patient Fall Risk Level High fall risk  High fall risk      Patient at Risk for Falls Due to  History of fall(s) No Fall Risks No Fall Risks History of fall(s);Impaired balance/gait;Impaired mobility  Fall risk Follow up  Falls evaluation completed   Falls evaluation completed Falls evaluation completed;Falls prevention discussed;Education provided     Data saved with a previous flowsheet row definition   Functional Status Survey:    Vitals:   12/09/23 1125  BP: (!) 187/76  Pulse: 68  Resp: 16  Temp: (!) 96.9 F (36.1 C)  SpO2: 95%  Weight: 189 lb 8 oz (86 kg)   Height: 5' 3 (1.6 m)   Body mass index is 33.57 kg/m. Physical Exam Constitutional:      Appearance: Normal appearance.  HENT:     Head: Normocephalic and atraumatic.  Cardiovascular:     Rate and Rhythm: Normal rate and regular rhythm.  Pulmonary:     Effort: Pulmonary effort is normal. No respiratory distress.     Breath sounds: Normal breath sounds. No wheezing.  Abdominal:     General: Bowel sounds are normal. There is no distension.     Tenderness: There is no abdominal tenderness. There is no guarding or rebound.     Comments:    Musculoskeletal:     Comments: Rt heel DTI  Surrounding erythema with serosanguinous discharge    Neurological:     Mental Status: She is alert. Mental status is at baseline.     Motor: No weakness.     Labs reviewed: Recent Labs    11/10/23 1820 11/11/23 0505 11/12/23 0608 11/19/23 0000  NA 141 139 138 136*  K 3.5 3.4* 4.9 4.0  CL 99 101 102 102  CO2 30 31 31  28*  GLUCOSE 123* 121* 105*  --   BUN 26* 27* 20 18  CREATININE 0.84 0.68 0.70 0.8  CALCIUM  8.4* 8.0* 8.1* 8.3*  MG 2.1 2.1 2.2  --   PHOS 2.0* 2.6 2.4*  --    Recent Labs    11/10/23 1820 11/11/23 0505 11/12/23 0608 11/19/23 0000  AST 31 30 32 16  ALT 17 16 18 9   ALKPHOS 39 36* 38 92  BILITOT 0.8 1.3* 1.7*  --   PROT 5.5* 5.1* 5.2*  --   ALBUMIN 2.7*  2.6* 2.6* 3.1*   Recent Labs    11/10/23 0605 11/10/23 1820 11/11/23 0505 11/12/23 0608 11/19/23 0000 11/22/23 1039  WBC 7.8  --  6.9 6.8 6.3 7.3  NEUTROABS 5.1  --  4.1 3.9  --   --   HGB 7.9*   < > 7.9* 7.6* 10.1* 11.4  HCT 24.2*   < > 24.7* 23.8* 32* 36.2  MCV 103.4*  --  105.6* 105.3*  --  105*  PLT 108*  --  126* 142* 202 240   < > = values in this interval not displayed.   Lab Results  Component Value Date   TSH 4.206 11/07/2023   Lab Results  Component Value Date   HGBA1C 4.8 11/08/2023   Lab Results  Component Value Date   CHOL 159 09/14/2023   HDL 42 09/14/2023   LDLCALC 100 (H)  09/14/2023   TRIG 85 09/14/2023   CHOLHDL 3.8 09/14/2023    Significant Diagnostic Results in last 30 days:  DG FEMUR, MIN 2 VIEWS RIGHT Result Date: 11/28/2023 CLINICAL DATA:  Pain EXAM: RIGHT FEMUR 2 VIEWS COMPARISON:  11/07/2023, 11/06/2023 FINDINGS: Previous right knee replacement. Status post intramedullary rod and distal screw fixation of the femur for comminuted right intertrochanteric fracture. Grossly stable alignment of hardware compared with intraoperative spot views. Displaced lesser trochanteric fracture fragment. No definitive bridging callus. Vascular calcifications IMPRESSION: Status post ORIF of right intertrochanteric fracture with grossly stable fracture alignment and appearance of hardware compared with intraoperative limited spot views. Electronically Signed   By: Luke Bun M.D.   On: 11/28/2023 23:59   DG CHEST PORT 1 VIEW Result Date: 11/11/2023 CLINICAL DATA:  Shortness of breath and hypertension. EXAM: PORTABLE CHEST 1 VIEW COMPARISON:  11/10/2023 FINDINGS: There is a left chest wall pacer with leads in the right atrial appendage and right ventricle. Stable cardiomediastinal contours. Aortic atherosclerotic calcifications. Atelectasis versus airspace disease noted within the periphery of the left base. Visualized osseous structures appear grossly intact. IMPRESSION: Opacity within the periphery of the left base compatible with atelectasis versus airspace disease. Aortic Atherosclerosis (ICD10-I70.0). Electronically Signed   By: Waddell Calk M.D.   On: 11/11/2023 08:07   DG CHEST PORT 1 VIEW Result Date: 11/10/2023 CLINICAL DATA:  Shortness of breath. EXAM: PORTABLE CHEST 1 VIEW COMPARISON:  11/09/2023 FINDINGS: The lungs are clear without focal pneumonia, edema, pneumothorax or pleural effusion. Cardiopericardial silhouette is at upper limits of normal for size. Degenerative changes noted in both shoulders. Left-sided permanent pacemaker again noted. Telemetry leads overlie  the chest. IMPRESSION: No active disease. No features of pulmonary edema on the current study. Electronically Signed   By: Camellia Candle M.D.   On: 11/10/2023 09:15    Assessment/Plan  1. Deep tissue injury (Primary) Pt with Rt heel DTI  3x 3 cm ulcer with serosanguinous discharge, surrounding erythema  Will start doxycyline  Will check cbc, bmp Med honey, bunny boots  2. Paroxysmal atrial fibrillation (HCC) Cont with amiodarone , apixiban No signs of bleeding  3. Chronic congestive heart failure, unspecified heart failure type (HCC) Lungs clear  Cont with lasix    4. Closed fracture of right hip, sequela Cont with PT/OT Follow up with ortho Take oxycodone  5 mg q 6 prn     30 min Total time spent for obtaining history,  performing a medically appropriate examination and evaluation, reviewing the tests,  documenting clinical information in the electronic or other health record ,care coordination (not separately reported)

## 2023-12-10 LAB — CBC AND DIFFERENTIAL
HCT: 41 (ref 36–46)
Hemoglobin: 12.8 (ref 12.0–16.0)
Neutrophils Absolute: 3289
Platelets: 143 K/uL — AB (ref 150–400)
WBC: 5.4

## 2023-12-10 LAB — CBC: RBC: 3.9 (ref 3.87–5.11)

## 2023-12-10 LAB — BASIC METABOLIC PANEL WITH GFR
BUN: 16 (ref 4–21)
CO2: 26 — AB (ref 13–22)
Chloride: 102 (ref 99–108)
Creatinine: 0.8 (ref 0.5–1.1)
Glucose: 95
Sodium: 138 (ref 137–147)

## 2023-12-10 LAB — COMPREHENSIVE METABOLIC PANEL WITH GFR
Calcium: 8.9 (ref 8.7–10.7)
eGFR: 69

## 2023-12-12 ENCOUNTER — Encounter: Payer: Self-pay | Admitting: Sports Medicine

## 2023-12-12 ENCOUNTER — Encounter: Payer: Self-pay | Admitting: Nurse Practitioner

## 2023-12-12 DIAGNOSIS — L89619 Pressure ulcer of right heel, unspecified stage: Secondary | ICD-10-CM | POA: Insufficient documentation

## 2023-12-12 DIAGNOSIS — L89612 Pressure ulcer of right heel, stage 2: Secondary | ICD-10-CM | POA: Insufficient documentation

## 2024-01-01 ENCOUNTER — Encounter (HOSPITAL_BASED_OUTPATIENT_CLINIC_OR_DEPARTMENT_OTHER): Admitting: Orthopaedic Surgery

## 2024-01-07 LAB — HEPATIC FUNCTION PANEL
ALT: 14 U/L (ref 7–35)
AST: 25 (ref 13–35)
Alkaline Phosphatase: 102 (ref 25–125)
Bilirubin, Direct: 0.1 (ref 0.01–0.4)
Bilirubin, Total: 0.4

## 2024-01-07 LAB — COMPREHENSIVE METABOLIC PANEL WITH GFR
Albumin: 3.7 (ref 3.5–5.0)
Globulin: 3.4

## 2024-01-07 LAB — TSH: TSH: 22.79 — AB (ref 0.41–5.90)

## 2024-01-08 ENCOUNTER — Non-Acute Institutional Stay (SKILLED_NURSING_FACILITY): Payer: Self-pay | Admitting: Adult Health

## 2024-01-08 ENCOUNTER — Encounter: Payer: Self-pay | Admitting: Adult Health

## 2024-01-08 DIAGNOSIS — I4819 Other persistent atrial fibrillation: Secondary | ICD-10-CM | POA: Diagnosis not present

## 2024-01-08 DIAGNOSIS — E039 Hypothyroidism, unspecified: Secondary | ICD-10-CM | POA: Diagnosis not present

## 2024-01-08 DIAGNOSIS — F0390 Unspecified dementia without behavioral disturbance: Secondary | ICD-10-CM | POA: Diagnosis not present

## 2024-01-08 NOTE — Progress Notes (Addendum)
 Location:  [facility] Room Number: 15 A Place of Service:  SNF [phone]) Provider:  Jereld JAYSON [name], NP   Patient Care Team: [name], MD as PCP - General (Internal Medicine) [name], MD as PCP - Electrophysiology (Cardiology) [name], FNP (Hospice and Palliative Medicine)  Extended Emergency Contact Information Primary Emergency Contact: [name] Phone: [phone] Mobile Phone: [phone] Relation: Daughter Interpreter needed? No Secondary Emergency Contact: Pearman,Karen Address: [address]          [place], [place] [address] [place] Home Phone: [phone] Mobile Phone: [phone] Relation: Daughter  Code Status:  DNR Goals of care: Advanced Directive information    12/09/2023   10:55 AM  Advanced Directives  Does Patient Have a Medical Advance Directive? Yes  Type of Estate agent of [place];Out of facility DNR (pink MOST or yellow form)  Does patient want to make changes to medical advance directive? No - Patient declined  Copy of Healthcare Power of Attorney in Chart? Yes - validated most recent copy scanned in chart (See row information)     Chief Complaint  Patient presents with   Acute Visit    Elevated TSH    HPI:  Pt is a 89 y.o. female seen today for an acute visit for elevated TSH. She is a resident of [facility]. She was seen in her room today. She denies concerns. Latest tsh 22.79, elevated. She takes Levothyroxine  100 mcg daily for hypothyroidism.   She has atrial fibrillation for which she takes Apixaban  5 mg daily and amiodarone  200 mg daily.  She has dementia for which she takes Namenda  10 mg twice a day.  Latest BIMS score 5/15, ranging as severe cognitive impairment.    Past Medical History:  Diagnosis Date   Aortic stenosis 03/08/2016   Brady-tachy syndrome (HCC)    a. Biotronik dual chamber PPM implanted 2009  b. gen change to STJ dual chamber PPM 2017   Ejection fraction    EF 70%, echo, October, 2009  //   EF 55-60%, septal dyssynergy consistent with a paced rhythm, mild to moderate mitral regurgitation, echo, November, 2015    GERD (gastroesophageal reflux disease) 04/28/2014   Episodes November, 2015 with fluid refluxing from her esophagus.    Hair loss    Patient questioned Coumadin, changed toPradaxa   Hypertension    Hypothyroidism    Lower extremity edema 01/22/2018   Mitral regurgitation 05/03/2014   Mild-to-moderate mitral regurgitation, echo, November, 2015    Osteoarthritis of left knee 10/25/2017   Paroxysmal atrial fibrillation (HCC)    Episodes rapid atrial fib noted by pacemaker interrogation, August, 2011, diltiazem  added, patient improved. Patient continues on Rythmol   //   Changed to flecainide  2013  //   flecainide  level checked in 2013, good level    Persistent atrial fibrillation (HCC)    Presence of permanent cardiac pacemaker    Past Surgical History:  Procedure Laterality Date   ABDOMINAL HYSTERECTOMY     EP IMPLANTABLE DEVICE N/A 08/08/2015   Generator change with SJM Assurity DR PPM by Dr Kelsie   FEMUR IM NAIL Right 11/07/2023   Procedure: INSERTION, INTRAMEDULLARY ROD, FEMUR, RETROGRADE;  Surgeon: Genelle Standing, MD;  Location: [facility] OR;  Service: Orthopedics;  Laterality: Right;   JOINT REPLACEMENT     PACEMAKER PLACEMENT  2009        Allergies  Allergen Reactions   Morphine And Codeine Other (See  Comments)    Patient states that she went crazy with this   Penicillins Swelling and Rash    Has patient had a PCN reaction causing immediate rash, facial/tongue/throat swelling, SOB or lightheadedness with hypotension: No Has patient had a PCN reaction causing severe rash involving mucus membranes or skin necrosis: No Has patient had a PCN reaction that required hospitalization No Has patient had a PCN reaction occurring within the last 10 years: No If all of  the above answers are NO, then may proceed with Cephalosporin use.   Aspirin      Pacemaker   Clindamycin/Lincomycin Other (See Comments)    Any meds with mycin in the name Unknown reaction Not listed on the [facility]   Crestor  [Rosuvastatin ]     Other reaction(s): muscle pain Not listed on the [facility]   Diltiazem      Skin discoloration, lower extremity swelling Not listed on the [facility]   Erythromycin Other (See Comments)    Any meds with mycin in the name Unknown reaction Not listed on the [facility]   Lisinopril Other (See Comments)    REACTION: Cough   Metronidazole     Other reaction(s): mouth sores, tongue swelling   Morphine    Penicillins Cross Reactors Other (See Comments)    Any meds with mycin in the name Unknown reaction    Outpatient Encounter Medications as of 01/08/2024  Medication Sig   acetaminophen  (TYLENOL ) 500 MG tablet Take 2 tablets (1,000 mg total) by mouth every 8 (eight) hours as needed for moderate pain (pain score 4-6).   albuterol  (PROVENTIL ) (2.5 MG/3ML) 0.083% nebulizer solution Take 3 mLs (2.5 mg total) by nebulization every 6 (six) hours as needed for wheezing or shortness of breath.   Amino Acids-Protein Hydrolys (PRO-STAT) LIQD Take 30 mLs by mouth 2 (two) times daily.   amiodarone  (PACERONE ) 200 MG tablet Take 200 mg by mouth daily.   apixaban  (ELIQUIS ) 5 MG TABS tablet Take 1 tablet (5 mg total) by mouth 2 (two) times daily.   Apoaequorin (PREVAGEN) 10 MG CAPS Take 10 mg by mouth daily.   betamethasone , augmented, (DIPROLENE ) 0.05 % gel Apply topically daily.  Apply to scalp topically every 12 hours as needed for psoriasis related to PSORIASIS, UNSPECIFIED (L40.9) apply to affected area of scalp   betamethasone , augmented, (DIPROLENE ) 0.05 % gel Apply 1 application  topically every 12 (twelve) hours as needed.   bisacodyl  (DULCOLAX) 10 MG suppository Place 1 suppository (10 mg total) rectally daily as needed for moderate constipation.   brimonidine -timolol   (COMBIGAN ) 0.2-0.5 % ophthalmic solution Place 1 drop into the right eye every 12 (twelve) hours.   Calcium  Carbonate-Vit D-Min (CALCIUM  600+D PLUS MINERALS) 600-400 MG-UNIT TABS Take 1 tablet by mouth daily.   diclofenac  Sodium (VOLTAREN ) 1 % GEL Apply 1 g topically 2 (two) times daily as needed (to L foot bunion deformity for pain).   furosemide  (LASIX ) 20 MG tablet TAKE ONE TABLET BY MOUTH EVERY DAY   Ipratropium-Albuterol  (COMBIVENT) 20-100 MCG/ACT AERS respimat Inhale 2 puffs into the lungs every 6 (six) hours as needed for shortness of breath.   Iron , Ferrous Sulfate , 325 (65 Fe) MG TABS Take 325 mg by mouth daily.   levothyroxine  (SYNTHROID ) 100 MCG tablet TAKE ONE TABLET BY MOUTH EVERY DAY IN THE MORNING   lidocaine  (HM LIDOCAINE  PATCH) 4 % Place 1 patch onto the skin in the morning. Apply topically to L shoulder in the morning. Apply for 12 hours (0900) then removed for 12 hours (  2100).   Multiple Vitamin (MULTIVITAMIN ADULT) TABS Take 1 tablet by mouth in the morning.   Multiple Vitamins-Minerals (OCUVITE PO) Take 1 capsule by mouth in the morning.   NAMENDA  10 MG tablet TAKE ONE TABLET BY MOUTH TWICE A DAY FOR COGNITIVE DECLINE   nystatin cream (MYCOSTATIN) Apply 1 Application topically every 12 (twelve) hours as needed (for rash).   ondansetron  (ZOFRAN ) 4 MG tablet Take 1 tablet (4 mg total) by mouth every 6 (six) hours as needed for nausea.   oxyCODONE  (ROXICODONE ) 5 MG immediate release tablet Take 1 tablet (5 mg total) by mouth every 6 (six) hours as needed for severe pain (pain score 7-10).   pantoprazole (PROTONIX) 40 MG tablet Take 40 mg by mouth daily as needed (for indigestion).   Polyethyl Glyc-Propyl Glyc PF (SYSTANE HYDRATION PF) 0.4-0.3 % SOLN Place 1 drop into the right eye in the morning, at noon, in the evening, and at bedtime.   polyethylene glycol (MIRALAX  / GLYCOLAX ) 17 g packet Take 17 g by mouth every other day.   potassium chloride  (KLOR-CON  M) 10 MEQ tablet Take 1  tablet (10 mEq total) by mouth daily.   povidone-iodine 10 % swab Apply 1 Application topically daily.   senna-docusate (SENOKOT-S) 8.6-50 MG tablet Take 1 tablet by mouth at bedtime as needed for mild constipation.   sennosides-docusate sodium  (SENOKOT-S) 8.6-50 MG tablet Take 1 tablet by mouth daily.   amiodarone  (PACERONE ) 200 MG tablet Take 1 tablet (200 mg total) by mouth 2 (two) times daily for 7 days, THEN 1 tablet (200 mg total) daily for 7 days. (Patient not taking: No sig reported)   dextromethorphan -guaiFENesin  (MUCINEX  DM) 30-600 MG 12hr tablet Take 1 tablet by mouth 2 (two) times daily. (Patient not taking: Reported on 01/08/2024)   mupirocin  ointment (BACTROBAN ) 2 % Apply 1 Application topically 2 (two) times daily. (Patient not taking: Reported on 01/08/2024)   oxyCODONE  (OXY IR/ROXICODONE ) 5 MG immediate release tablet Take 5 mg by mouth 2 (two) times daily. (Patient not taking: Reported on 01/08/2024)   No facility-administered encounter medications on file as of 01/08/2024.    Review of Systems  Constitutional:  Negative for appetite change, chills, fatigue and fever.  HENT:  Negative for congestion, hearing loss, rhinorrhea and sore throat.   Eyes: Negative.   Respiratory:  Negative for cough, shortness of breath and wheezing.   Cardiovascular:  Negative for chest pain, palpitations and leg swelling.  Gastrointestinal:  Negative for abdominal pain, constipation, diarrhea, nausea and vomiting.  Genitourinary:  Negative for dysuria.  Musculoskeletal:  Negative for arthralgias, back pain and myalgias.  Skin:  Negative for color change, rash and wound.  Neurological:  Negative for dizziness, weakness and headaches.  Psychiatric/Behavioral:  Negative for behavioral problems. The patient is not nervous/anxious.     Immunization History  Administered Date(s) Administered   Fluad Quad(high Dose 65+) 04/02/2022   Fluzone Influenza virus vaccine,trivalent (IIV3), split virus  02/24/2019, 03/08/2020, 03/25/2021   Influenza Split 03/11/2009, 03/11/2010, 02/25/2011   Influenza, High Dose Seasonal PF 03/08/2015, 04/03/2017, 03/17/2018, 03/08/2020, 04/15/2023   Influenza,inj,Quad PF,6+ Mos 03/08/2016   Influenza,inj,quad, With Preservative 03/11/2017   Moderna Covid-19 Vaccine  Bivalent Booster 59yrs & up 10/27/2021, 07/11/2023   Moderna SARS-COV2 Booster Vaccination 11/08/2020   Moderna Sars-Covid-2 Vaccination 06/15/2019, 07/13/2019, 04/19/2020, 02/27/2022   PNEUMOCOCCAL CONJUGATE-20 06/27/2023   Pfizer Covid-19 Vaccine Bivalent Booster 36yrs & up 02/28/2021, 04/12/2022   Pneumococcal Conjugate-13 12/06/2014   Pneumococcal Polysaccharide-23 05/13/2001, 03/11/2009   Polio, Unspecified  02/02/1958   Tdap 11/21/2011, 04/11/2023   Zoster Recombinant(Shingrix) 07/18/2022, 10/19/2022   Pertinent  Health Maintenance Due  Topic Date Due   INFLUENZA VACCINE  01/10/2024   DEXA SCAN  Completed      02/16/2022    9:45 AM 02/27/2022   11:41 AM 07/06/2022   10:04 AM 07/13/2022    3:55 PM 03/25/2023    4:51 PM  Fall Risk  Falls in the past year?   0 0 1  Was there an injury with Fall?   0 0 0  Fall Risk Category Calculator   0 0 1  (RETIRED) Patient Fall Risk Level High fall risk  High fall risk      Patient at Risk for Falls Due to  History of fall(s) No Fall Risks No Fall Risks History of fall(s);Impaired balance/gait;Impaired mobility  Fall risk Follow up  Falls evaluation completed   Falls evaluation completed Falls evaluation completed;Falls prevention discussed;Education provided     Data saved with a previous flowsheet row definition   Functional Status Survey:    Vitals:   01/08/24 1336  BP: 135/75  Pulse: 80  Temp: (!) 97.4 F (36.3 C)  SpO2: 93%  Weight: 189 lb 11.2 oz (86 kg)  Height: 5' 3 (1.6 m)   Body mass index is 33.6 kg/m. Physical Exam Constitutional:      General: She is not in acute distress.    Appearance: She is obese.  HENT:      Head: Normocephalic and atraumatic.     Nose: Nose normal.     Mouth/Throat:     Mouth: Mucous membranes are moist.  Eyes:     Conjunctiva/sclera: Conjunctivae normal.  Cardiovascular:     Rate and Rhythm: Normal rate and regular rhythm.     Comments: Left chest pacemaker Pulmonary:     Effort: Pulmonary effort is normal.     Breath sounds: Normal breath sounds.  Abdominal:     General: Bowel sounds are normal.     Palpations: Abdomen is soft.  Musculoskeletal:     Cervical back: Normal range of motion.     Right lower leg: Edema present.     Left lower leg: Edema present.  Skin:    General: Skin is warm and dry.  Neurological:     Mental Status: She is alert.  Psychiatric:        Mood and Affect: Mood normal.        Behavior: Behavior normal.     Labs reviewed: Recent Labs    11/10/23 1820 11/11/23 0505 11/12/23 0608 11/19/23 0000 12/10/23 0000  NA 141 139 138 136* 138  K 3.5 3.4* 4.9 4.0  --   CL 99 101 102 102 102  CO2 30 31 31  28* 26*  GLUCOSE 123* 121* 105*  --   --   BUN 26* 27* 20 18 16   CREATININE 0.84 0.68 0.70 0.8 0.8  CALCIUM  8.4* 8.0* 8.1* 8.3* 8.9  MG 2.1 2.1 2.2  --   --   PHOS 2.0* 2.6 2.4*  --   --    Recent Labs    11/10/23 1820 11/11/23 0505 11/12/23 0608 11/19/23 0000 01/07/24 0000  AST 31 30 32 16 25  ALT 17 16 18 9 14   ALKPHOS 39 36* 38 92 102  BILITOT 0.8 1.3* 1.7*  --   --   PROT 5.5* 5.1* 5.2*  --   --   ALBUMIN 2.7* 2.6* 2.6* 3.1* 3.7  Recent Labs    11/11/23 0505 11/12/23 0608 11/19/23 0000 11/22/23 1039 12/10/23 0000  WBC 6.9 6.8 6.3 7.3 5.4  NEUTROABS 4.1 3.9  --   --  3,289.00  HGB 7.9* 7.6* 10.1* 11.4 12.8  HCT 24.7* 23.8* 32* 36.2 41  MCV 105.6* 105.3*  --  105*  --   PLT 126* 142* 202 240 143*   Lab Results  Component Value Date   TSH 22.79 (A) 01/07/2024   Lab Results  Component Value Date   HGBA1C 4.8 11/08/2023   Lab Results  Component Value Date   CHOL 159 09/14/2023   HDL 42 09/14/2023    LDLCALC 100 (H) 09/14/2023   TRIG 85 09/14/2023   CHOLHDL 3.8 09/14/2023    Significant Diagnostic Results in last 30 days:  No results found.  Assessment/Plan  1. Hypothyroidism, unspecified type (Primary) Lab Results  Component Value Date   TSH 22.79 (A) 01/07/2024    -  tsh elevated -  will increase Levothyroxine  from 100 mcg daily to Levothyroxine  112 mcg daily -  tsh in 6 weeks  2. Persistent atrial fibrillation (HCC) - rate-controlled -  continue Apixaban  5 mg daily -  continue Amiodarone  200 mg daily  3. Senile dementia (HCC) -  BIMS score 3/15, ranging as severe cognitive impairment -  continue Namenda  10 mg BID -  continue supportive care -  fall precautions    Family/ staff Communication:  Discussed plan of care with resident and charge nurse.  Labs/tests ordered:   tsh in 6 weeks

## 2024-01-10 ENCOUNTER — Encounter: Payer: Self-pay | Admitting: Nurse Practitioner

## 2024-01-10 ENCOUNTER — Ambulatory Visit: Payer: Medicare Other

## 2024-01-10 ENCOUNTER — Non-Acute Institutional Stay (SKILLED_NURSING_FACILITY): Payer: Self-pay | Admitting: Nurse Practitioner

## 2024-01-10 DIAGNOSIS — I480 Paroxysmal atrial fibrillation: Secondary | ICD-10-CM

## 2024-01-10 DIAGNOSIS — F0390 Unspecified dementia without behavioral disturbance: Secondary | ICD-10-CM

## 2024-01-10 DIAGNOSIS — I509 Heart failure, unspecified: Secondary | ICD-10-CM | POA: Diagnosis not present

## 2024-01-10 DIAGNOSIS — R1319 Other dysphagia: Secondary | ICD-10-CM | POA: Diagnosis not present

## 2024-01-10 DIAGNOSIS — I10 Essential (primary) hypertension: Secondary | ICD-10-CM

## 2024-01-10 DIAGNOSIS — K5901 Slow transit constipation: Secondary | ICD-10-CM

## 2024-01-10 DIAGNOSIS — Z950 Presence of cardiac pacemaker: Secondary | ICD-10-CM | POA: Diagnosis not present

## 2024-01-10 DIAGNOSIS — I495 Sick sinus syndrome: Secondary | ICD-10-CM

## 2024-01-10 DIAGNOSIS — M150 Primary generalized (osteo)arthritis: Secondary | ICD-10-CM

## 2024-01-10 DIAGNOSIS — N1831 Chronic kidney disease, stage 3a: Secondary | ICD-10-CM | POA: Diagnosis not present

## 2024-01-10 DIAGNOSIS — K219 Gastro-esophageal reflux disease without esophagitis: Secondary | ICD-10-CM

## 2024-01-10 DIAGNOSIS — E039 Hypothyroidism, unspecified: Secondary | ICD-10-CM

## 2024-01-10 LAB — CUP PACEART REMOTE DEVICE CHECK
Battery Remaining Longevity: 31 mo
Battery Remaining Percentage: 25 %
Battery Voltage: 2.93 V
Brady Statistic AP VP Percent: 1 %
Brady Statistic AP VS Percent: 58 %
Brady Statistic AS VP Percent: 1 %
Brady Statistic AS VS Percent: 41 %
Brady Statistic RA Percent Paced: 58 %
Brady Statistic RV Percent Paced: 1 %
Date Time Interrogation Session: 20250801040013
Implantable Lead Connection Status: 753985
Implantable Lead Connection Status: 753985
Implantable Lead Implant Date: 20091109
Implantable Lead Implant Date: 20091109
Implantable Lead Location: 753859
Implantable Lead Location: 753860
Implantable Lead Model: 350
Implantable Lead Serial Number: 24891460
Implantable Lead Serial Number: 28411861
Implantable Pulse Generator Implant Date: 20170227
Lead Channel Impedance Value: 1250 Ohm
Lead Channel Impedance Value: 450 Ohm
Lead Channel Pacing Threshold Amplitude: 0.75 V
Lead Channel Pacing Threshold Amplitude: 0.75 V
Lead Channel Pacing Threshold Pulse Width: 0.4 ms
Lead Channel Pacing Threshold Pulse Width: 0.4 ms
Lead Channel Sensing Intrinsic Amplitude: 0.6 mV
Lead Channel Sensing Intrinsic Amplitude: 12 mV
Lead Channel Setting Pacing Amplitude: 2 V
Lead Channel Setting Pacing Amplitude: 2.5 V
Lead Channel Setting Pacing Pulse Width: 0.4 ms
Lead Channel Setting Sensing Sensitivity: 2 mV
Pulse Gen Model: 2240
Pulse Gen Serial Number: 7885781

## 2024-01-10 NOTE — Assessment & Plan Note (Signed)
stable, on Pantoprazole  

## 2024-01-10 NOTE — Assessment & Plan Note (Signed)
 tolerated Namenda well, helped with her mood as well. MMSE 27/30 04/09/23, CT head 02/12/22 Mild age-related atrophy and chronic microvascular ischemic changes.

## 2024-01-10 NOTE — Assessment & Plan Note (Signed)
 Bun/creat 16/0.8 12/10/23

## 2024-01-10 NOTE — Assessment & Plan Note (Signed)
 takes Tylenol , ambulates with walker prior to fx of R hip 11/06/23

## 2024-01-10 NOTE — Assessment & Plan Note (Signed)
 taking Levothyroxine , TSH 4.206 11/07/23

## 2024-01-10 NOTE — Assessment & Plan Note (Signed)
 SSS,  f/u Cardiology

## 2024-01-10 NOTE — Assessment & Plan Note (Signed)
 Heart rate is controlled, off Metoprolol , on Amiodarone , followed by Cardiology, on  Eliquis .

## 2024-01-10 NOTE — Assessment & Plan Note (Signed)
 Stable, taking Senokot S, MiraLax 

## 2024-01-10 NOTE — Assessment & Plan Note (Signed)
 Chronic,  edema BLE, on Furosemide . EF 70-75% 09/19/21, 03/27/22 Venous US , negative DVT. 03/30/22 arterial US  mild to moderate obstructive disease BLE

## 2024-01-10 NOTE — Assessment & Plan Note (Signed)
 Blood pressure is controlled, off Metoprolol  05/27/23 2/2 low Bp, on Furosemide ,  f/u Cardiology. Bun/creat 20/0.7 11/12/23

## 2024-01-10 NOTE — Assessment & Plan Note (Signed)
nectar liquid 

## 2024-01-10 NOTE — Progress Notes (Signed)
 Location:   SNF [facility] Room Number: 15 Place of Service:  SNF (31) Provider: Larwance Rosabell Geyer NP  [name], MD  Patient Care Team: [name], MD as PCP - General (Internal Medicine) [name], MD as PCP - Electrophysiology (Cardiology) [name], FNP [facility] and Palliative Medicine)  Extended Emergency Contact Information Primary Emergency Contact: [facility] Phone: [phone] Mobile Phone: [phone] Relation: Daughter Interpreter needed? No Secondary Emergency Contact: Pearman,Karen Address: [address]          [place], [place] [address] [place] Home Phone: [phone] Mobile Phone: [phone] Relation: Daughter  Code Status:  DNR Goals of care: Advanced Directive information    12/09/2023   10:55 AM  Advanced Directives  Does Patient Have a Medical Advance Directive? Yes  Type of Estate agent of [place];Out of facility DNR (pink MOST or yellow form)  Does patient want to make changes to medical advance directive? No - Patient declined  Copy of Healthcare Power of Attorney in Chart? Yes - validated most recent copy scanned in chart (See row information)     Chief Complaint  Patient presents with   Medical Management of Chronic Issues    HPI:  Pt is a 89 y.o. female seen today for medical management of chronic diseases.    Hospitalized 11/06/23-11/12/23 for closed displaced intertrochanteric fx of R femur, s/p IM, WBAT. Hospital stay was complicated with acute blood loss anemia and acute respiratory failure with hypoxia.                BRBPR 05/23/23, FOBT positive 05/24/23, noted hemorrhoids, placed PPI, Hgb 12.8 12/10/23              01/11/23 family expressed concerning of hand and fingers tingling and numbness, desires checking TSH 4.38 01/26/23 If Namenda  is the culprit. The patient stated her tingling and numbness in hands and fingers has been the same, declined workup  or neurology referral.                           Chronic numbness of R+L hands, has been using fingerless gloves which helped, declined workup, neurology referral, or medication             Anemia, post op, on Iron , Hgb 12.8 12/10/23             Senile dementia, tolerated Namenda  well, helped with her mood as well. MMSE 27/30 04/09/23, CT head 02/12/22 Mild age-related atrophy and chronic microvascular ischemic changes.             Afib, off Metoprolol , on Amiodarone , followed by Cardiology, on  Eliquis .             AS moderate             HTN off Metoprolol  05/27/23 2/2 low Bp, on Furosemide ,  f/u Cardiology. Bun/creat 20/0.7 11/12/23             Pacemaker, SSS,  f/u Cardiology             CHF, AS, edema BLE, on Furosemide . EF 70-75% 09/19/21, 03/27/22 Venous US , negative DVT. 03/30/22 arterial US  mild to moderate obstructive disease BLE             CKD Bun/creat 16/0.8 12/10/23             Dysphagia, nectar liquid.  Hypothyroidism, taking Levothyroxine , TSH 4.206 11/07/23             OA, takes Tylenol , ambulates with walker prior to fx of R hip 11/06/23             GERD, stable, on Pantoprazole.              Constipation, taking Senokot S, MiraLax    Past Medical History:  Diagnosis Date   Aortic stenosis 03/08/2016   Brady-tachy syndrome (HCC)    a. Biotronik dual chamber PPM implanted 2009 b. gen change to STJ dual chamber PPM 2017   Ejection fraction    EF 70%, echo, October, 2009  //   EF 55-60%, septal dyssynergy consistent with a paced rhythm, mild to moderate mitral regurgitation, echo, November, 2015    GERD (gastroesophageal reflux disease) 04/28/2014   Episodes November, 2015 with fluid refluxing from her esophagus.    Hair loss    Patient questioned Coumadin, changed toPradaxa   Hypertension    Hypothyroidism    Lower extremity edema 01/22/2018   Mitral regurgitation 05/03/2014   Mild-to-moderate mitral regurgitation, echo, November, 2015    Osteoarthritis of left knee  10/25/2017   Paroxysmal atrial fibrillation (HCC)    Episodes rapid atrial fib noted by pacemaker interrogation, August, 2011, diltiazem  added, patient improved. Patient continues on Rythmol   //   Changed to flecainide  2013  //   flecainide  level checked in 2013, good level    Persistent atrial fibrillation (HCC)    Presence of permanent cardiac pacemaker    Past Surgical History:  Procedure Laterality Date   ABDOMINAL HYSTERECTOMY     EP IMPLANTABLE DEVICE N/A 08/08/2015   Generator change with SJM Assurity DR PPM by Dr Kelsie   FEMUR IM NAIL Right 11/07/2023   Procedure: INSERTION, INTRAMEDULLARY ROD, FEMUR, RETROGRADE;  Surgeon: Genelle Standing, MD;  Location: [facility] OR;  Service: Orthopedics;  Laterality: Right;   JOINT REPLACEMENT     PACEMAKER PLACEMENT  2009             Review of Systems  Constitutional:  Negative for appetite change, fatigue and fever.  HENT:  Positive for hearing loss and trouble swallowing. Negative for congestion.   Eyes:  Negative for visual disturbance.  Respiratory:  Negative for cough, chest tightness and shortness of breath.   Cardiovascular:  Positive for leg swelling. Negative for chest pain and palpitations.  Gastrointestinal:  Negative for abdominal pain and constipation.  Genitourinary:  Negative for dysuria, frequency, hematuria and urgency.  Musculoskeletal:  Positive for arthralgias and gait problem.       Knees, L>R, intermittent in nature.  Left shoulder pain with ROM  Skin:  Negative for color change.       Chronic venous insufficiency skin changes: mild erythema in mid of R+L   Neurological:  Positive for numbness. Negative for tremors, speech difficulty, weakness and headaches.       Memory lapses. chronic numbness in fingers.   Psychiatric/Behavioral:  Negative for confusion and sleep disturbance. The patient is not nervous/anxious.     Immunization History  Administered Date(s) Administered   Fluad Quad(high Dose 65+) 04/02/2022    Fluzone Influenza virus vaccine,trivalent (IIV3), split virus 02/24/2019, 03/08/2020, 03/25/2021   Influenza Split 03/11/2009, 03/11/2010, 02/25/2011   Influenza, High Dose Seasonal PF 03/08/2015, 04/03/2017, 03/17/2018, 03/08/2020, 04/15/2023   Influenza,inj,Quad PF,6+ Mos 03/08/2016   Influenza,inj,quad, With Preservative 03/11/2017   Moderna Covid-19 Vaccine  Bivalent Booster 65yrs & up 10/27/2021, 07/11/2023   Moderna  SARS-COV2 Booster Vaccination 11/08/2020   Moderna Sars-Covid-2 Vaccination 06/15/2019, 07/13/2019, 04/19/2020, 02/27/2022   PNEUMOCOCCAL CONJUGATE-20 06/27/2023   Pfizer Covid-19 Vaccine Bivalent Booster 30yrs & up 02/28/2021, 04/12/2022   Pneumococcal Conjugate-13 12/06/2014   Pneumococcal Polysaccharide-23 05/13/2001, 03/11/2009   Polio, Unspecified 02/02/1958   Tdap 11/21/2011, 04/11/2023   Zoster Recombinant(Shingrix) 07/18/2022, 10/19/2022   Pertinent  Health Maintenance Due  Topic Date Due   INFLUENZA VACCINE  01/10/2024   DEXA SCAN  Completed      02/16/2022    9:45 AM 02/27/2022   11:41 AM 07/06/2022   10:04 AM 07/13/2022    3:55 PM 03/25/2023    4:51 PM  Fall Risk  Falls in the past year?   0 0 1  Was there an injury with Fall?   0 0 0  Fall Risk Category Calculator   0 0 1  (RETIRED) Patient Fall Risk Level High fall risk  High fall risk      Patient at Risk for Falls Due to  History of fall(s) No Fall Risks No Fall Risks History of fall(s);Impaired balance/gait;Impaired mobility  Fall risk Follow up  Falls evaluation completed   Falls evaluation completed Falls evaluation completed;Falls prevention discussed;Education provided     Data saved with a previous flowsheet row definition   Functional Status Survey:    Vitals:   01/10/24 1508  BP: 135/75  Pulse: 80  Resp: 17  Temp: (!) 97.4 F (36.3 C)  SpO2: 93%  Weight: 185 lb 3.2 oz (84 kg)   Body mass index is 32.81 kg/m. Physical Exam Vitals and nursing note reviewed.  Constitutional:       Appearance: Normal appearance.  HENT:     Head: Normocephalic and atraumatic.     Nose: Nose normal.     Mouth/Throat:     Mouth: Mucous membranes are moist.  Eyes:     Extraocular Movements: Extraocular movements intact.     Conjunctiva/sclera: Conjunctivae normal.     Pupils: Pupils are equal, round, and reactive to light.  Cardiovascular:     Rate and Rhythm: Normal rate. Rhythm irregular.     Heart sounds: Murmur heard.     Comments: No dorsalis pedis pulses felt from previous examination.  Pulmonary:     Effort: Pulmonary effort is normal.     Breath sounds: No rales.  Abdominal:     General: Bowel sounds are normal.     Palpations: Abdomen is soft.     Tenderness: There is no abdominal tenderness. There is no right CVA tenderness, left CVA tenderness, guarding or rebound.  Genitourinary:    Comments: External hemorrhoids from previous examination Musculoskeletal:        General: Tenderness present. Normal range of motion.     Cervical back: Normal range of motion and neck supple.     Right lower leg: Edema present.     Left lower leg: Edema present.     Comments: Trace edema LLE, 1+ edema RLE Left shoulder pain with ROM R hip pain  Skin:    General: Skin is warm and dry.     Findings: No erythema.     Comments: Mild dark erythema mid of R+L lower legs, chronic.   R hip surgical scar   Neurological:     General: No focal deficit present.     Mental Status: She is alert and oriented to person, place, and time. Mental status is at baseline.     Gait: Gait abnormal.  Comments: 5/5 muscle strength in fingers.   Psychiatric:        Mood and Affect: Mood normal.        Behavior: Behavior normal.     Labs reviewed: Recent Labs    11/10/23 1820 11/11/23 0505 11/12/23 0608 11/19/23 0000 12/10/23 0000  NA 141 139 138 136* 138  K 3.5 3.4* 4.9 4.0  --   CL 99 101 102 102 102  CO2 30 31 31  28* 26*  GLUCOSE 123* 121* 105*  --   --   BUN 26* 27* 20 18 16    CREATININE 0.84 0.68 0.70 0.8 0.8  CALCIUM  8.4* 8.0* 8.1* 8.3* 8.9  MG 2.1 2.1 2.2  --   --   PHOS 2.0* 2.6 2.4*  --   --    Recent Labs    11/10/23 1820 11/11/23 0505 11/12/23 0608 11/19/23 0000 01/07/24 0000  AST 31 30 32 16 25  ALT 17 16 18 9 14   ALKPHOS 39 36* 38 92 102  BILITOT 0.8 1.3* 1.7*  --   --   PROT 5.5* 5.1* 5.2*  --   --   ALBUMIN 2.7* 2.6* 2.6* 3.1* 3.7   Recent Labs    11/11/23 0505 11/12/23 0608 11/19/23 0000 11/22/23 1039 12/10/23 0000  WBC 6.9 6.8 6.3 7.3 5.4  NEUTROABS 4.1 3.9  --   --  3,289.00  HGB 7.9* 7.6* 10.1* 11.4 12.8  HCT 24.7* 23.8* 32* 36.2 41  MCV 105.6* 105.3*  --  105*  --   PLT 126* 142* 202 240 143*   Lab Results  Component Value Date   TSH 22.79 (A) 01/07/2024   Lab Results  Component Value Date   HGBA1C 4.8 11/08/2023   Lab Results  Component Value Date   CHOL 159 09/14/2023   HDL 42 09/14/2023   LDLCALC 100 (H) 09/14/2023   TRIG 85 09/14/2023   CHOLHDL 3.8 09/14/2023    Significant Diagnostic Results in last 30 days:  CUP PACEART REMOTE DEVICE CHECK Result Date: 01/10/2024 PPM scheduled remote reviewed. Normal device function.  Presenting rhythm: AP-VS Next remote 91 days. AB, CVRS   Assessment/Plan  Congestive heart failure (HCC) Chronic,  edema BLE, on Furosemide . EF 70-75% 09/19/21, 03/27/22 Venous US , negative DVT. 03/30/22 arterial US  mild to moderate obstructive disease BLE  CKD (chronic kidney disease) stage 3, GFR 30-59 ml/min (HCC) Bun/creat 16/0.8 12/10/23  Esophageal dysphagia  nectar liquid.   Hypothyroidism  taking Levothyroxine , TSH 4.206 11/07/23  Osteoarthritis, multiple sites takes Tylenol , ambulates with walker prior to fx of R hip 11/06/23  GERD (gastroesophageal reflux disease) stable, on Pantoprazole.   Slow transit constipation Stable,  taking Senokot S, MiraLax   Pacemaker SSS,  f/u Cardiology  Hypertension Blood pressure is controlled, off Metoprolol  05/27/23 2/2 low Bp, on  Furosemide ,  f/u Cardiology. Bun/creat 20/0.7 11/12/23  Paroxysmal atrial fibrillation (HCC) Heart rate is controlled,  off Metoprolol , on Amiodarone , followed by Cardiology, on  Eliquis .  Major neurocognitive disorder (HCC) tolerated Namenda  well, helped with her mood as well. MMSE 27/30 04/09/23, CT head 02/12/22 Mild age-related atrophy and chronic microvascular ischemic changes.   Family/ staff Communication: Plan of care reviewed with the patient and the charge nurse  Labs/tests ordered: None

## 2024-01-13 ENCOUNTER — Ambulatory Visit: Payer: Self-pay | Admitting: Cardiology

## 2024-01-15 ENCOUNTER — Other Ambulatory Visit: Payer: Self-pay | Admitting: Adult Health

## 2024-01-15 DIAGNOSIS — S72141S Displaced intertrochanteric fracture of right femur, sequela: Secondary | ICD-10-CM

## 2024-01-15 MED ORDER — OXYCODONE HCL 5 MG PO TABS: 5.0000 mg | ORAL_TABLET | Freq: Four times a day (QID) | ORAL | 0 refills | Status: DC | PRN

## 2024-01-17 ENCOUNTER — Ambulatory Visit (HOSPITAL_BASED_OUTPATIENT_CLINIC_OR_DEPARTMENT_OTHER): Admitting: Orthopaedic Surgery

## 2024-01-17 ENCOUNTER — Ambulatory Visit (HOSPITAL_BASED_OUTPATIENT_CLINIC_OR_DEPARTMENT_OTHER)

## 2024-01-17 DIAGNOSIS — S72141A Displaced intertrochanteric fracture of right femur, initial encounter for closed fracture: Secondary | ICD-10-CM

## 2024-01-17 DIAGNOSIS — S72141D Displaced intertrochanteric fracture of right femur, subsequent encounter for closed fracture with routine healing: Secondary | ICD-10-CM | POA: Diagnosis not present

## 2024-01-17 NOTE — Progress Notes (Signed)
 Post Operative Evaluation      Procedure/Date of Surgery: Right hip cephalomedullary nail 11/07/2023   Interval History:    Presents today status post above procedure.  Overall she is doing well although she has not been able to reestablish weightbearing even with a walker although she does do some light walking from time to time.     PMH/PSH/Family History/Social History/Meds/Allergies:         Past Medical History:  Diagnosis Date   Aortic stenosis 03/08/2016   Brady-tachy syndrome (HCC)      a. Biotronik dual chamber PPM implanted 2009 b. gen change to STJ dual chamber PPM 2017   Ejection fraction      EF 70%, echo, October, 2009  //   EF 55-60%, septal dyssynergy consistent with a paced rhythm, mild to moderate mitral regurgitation, echo, November, 2015    GERD (gastroesophageal reflux disease) 04/28/2014    Episodes November, 2015 with fluid refluxing from her esophagus.    Hair loss      Patient questioned Coumadin, changed toPradaxa   Hypertension     Hypothyroidism     Lower extremity edema 01/22/2018   Mitral regurgitation 05/03/2014    Mild-to-moderate mitral regurgitation, echo, November, 2015    Osteoarthritis of left knee 10/25/2017   Paroxysmal atrial fibrillation (HCC)      Episodes rapid atrial fib noted by pacemaker interrogation, August, 2011, diltiazem  added, patient improved. Patient continues on Rythmol   //   Changed to flecainide  2013  //   flecainide  level checked in 2013, good level    Persistent atrial fibrillation (HCC)     Presence of permanent cardiac pacemaker               Past Surgical History:  Procedure Laterality Date   ABDOMINAL HYSTERECTOMY       EP IMPLANTABLE DEVICE N/A 08/08/2015    Generator change with SJM Assurity DR PPM by Dr Kelsie   FEMUR IM NAIL Right 11/07/2023    Procedure: INSERTION, INTRAMEDULLARY ROD, FEMUR, RETROGRADE;  Surgeon: Genelle Standing, MD;  Location: [facility] OR;  Service: Orthopedics;   Laterality: Right;   JOINT REPLACEMENT       PACEMAKER PLACEMENT   2009             Social History         Socioeconomic History   Marital status: Widowed      Spouse name: Not on file   Number of children: Not on file   Years of education: Not on file   Highest education level: Not on file  Occupational History   Not on file  Tobacco Use   Smoking status: Former      Current packs/day: 0.00      Types: Cigarettes      Quit date: 09/06/1990      Years since quitting: 33.2   Smokeless tobacco: Never  Vaping Use   Vaping status: Never Used  Substance and Sexual Activity   Alcohol  use: No   Drug use: No   Sexual activity: Not on file  Other Topics Concern   Not on file  Social History Narrative   Not on file    Social Drivers of Health        Financial Resource Strain: Not on file  Food Insecurity: No Food Insecurity (11/06/2023)    Hunger Vital Sign     Worried About Running Out of Food in the Last Year: Never true  Ran Out of Food in the Last Year: Never true  Transportation Needs: No Transportation Needs (11/06/2023)    PRAPARE - Therapist, art (Medical): No     Lack of Transportation (Non-Medical): No  Physical Activity: Not on file  Stress: Not on file  Social Connections: Moderately Integrated (11/06/2023)    Social Connection and Isolation Panel [NHANES]     Frequency of Communication with Friends and Family: Twice a week     Frequency of Social Gatherings with Friends and Family: Three times a week     Attends Religious Services: 1 to 4 times per year     Active Member of Clubs or Organizations: No     Attends Engineer, structural: More than 4 times per year     Marital Status: Widowed         Family History  Problem Relation Age of Onset   Heart disease Father     Coronary artery disease Other     Heart disease Son                  Current Outpatient Medications  Medication Sig Dispense Refill    acetaminophen  (TYLENOL ) 500 MG tablet Take 2 tablets (1,000 mg total) by mouth every 8 (eight) hours as needed for moderate pain (pain score 4-6). (Patient not taking: Reported on 11/19/2023)       albuterol  (PROVENTIL ) (2.5 MG/3ML) 0.083% nebulizer solution Take 3 mLs (2.5 mg total) by nebulization every 6 (six) hours as needed for wheezing or shortness of breath.       amiodarone  (PACERONE ) 200 MG tablet Take 1 tablet (200 mg total) by mouth 2 (two) times daily for 7 days, THEN 1 tablet (200 mg total) daily for 7 days.       apixaban  (ELIQUIS ) 5 MG TABS tablet Take 1 tablet (5 mg total) by mouth 2 (two) times daily. 180 tablet 1   Apoaequorin (PREVAGEN) 10 MG CAPS Take 10 mg by mouth daily. 90 capsule 5   betamethasone , augmented, (DIPROLENE ) 0.05 % gel Apply topically daily.  Apply to scalp topically every 12 hours as needed for psoriasis related to PSORIASIS, UNSPECIFIED (L40.9) apply to affected area of scalp 60 g 2   bisacodyl  (DULCOLAX) 10 MG suppository Place 1 suppository (10 mg total) rectally daily as needed for moderate constipation.       brimonidine -timolol  (COMBIGAN ) 0.2-0.5 % ophthalmic solution Place 1 drop into the right eye every 12 (twelve) hours.       Calcium  Carbonate-Vit D-Min (CALCIUM  600+D PLUS MINERALS) 600-400 MG-UNIT TABS Take 1 tablet by mouth daily. 60 tablet 5   dextromethorphan -guaiFENesin  (MUCINEX  DM) 30-600 MG 12hr tablet Take 1 tablet by mouth 2 (two) times daily.       diclofenac  Sodium (VOLTAREN ) 1 % GEL Apply 1 g topically 2 (two) times daily as needed (to L foot bunion deformity for pain).       furosemide  (LASIX ) 20 MG tablet TAKE ONE TABLET BY MOUTH EVERY DAY 90 tablet 1   Ipratropium-Albuterol  (COMBIVENT) 20-100 MCG/ACT AERS respimat Inhale 2 puffs into the lungs every 6 (six) hours as needed for shortness of breath.       Iron , Ferrous Sulfate , 325 (65 Fe) MG TABS Take 325 mg by mouth daily.       levothyroxine  (SYNTHROID ) 100 MCG tablet TAKE ONE TABLET BY  MOUTH EVERY DAY IN THE MORNING 90 tablet 3   lidocaine  (HM LIDOCAINE  PATCH) 4 %  Place 1 patch onto the skin in the morning. Apply topically to L shoulder in the morning. Apply for 12 hours (0900) then removed for 12 hours (2100).       Multiple Vitamin (MULTIVITAMIN ADULT) TABS Take 1 tablet by mouth in the morning.       Multiple Vitamins-Minerals (OCUVITE PO) Take 1 capsule by mouth in the morning.       NAMENDA  10 MG tablet TAKE ONE TABLET BY MOUTH TWICE A DAY FOR COGNITIVE DECLINE 60 tablet 5   nystatin cream (MYCOSTATIN) Apply 1 Application topically every 12 (twelve) hours as needed (for rash).       ondansetron  (ZOFRAN ) 4 MG tablet Take 1 tablet (4 mg total) by mouth every 6 (six) hours as needed for nausea.       oxyCODONE  (ROXICODONE ) 5 MG immediate release tablet Take 1 tablet (5 mg total) by mouth every 6 (six) hours as needed for severe pain (pain score 7-10). 30 tablet 0   pantoprazole (PROTONIX) 40 MG tablet Take 40 mg by mouth daily as needed (for indigestion).       Polyethyl Glyc-Propyl Glyc PF (SYSTANE HYDRATION PF) 0.4-0.3 % SOLN Place 1 drop into the right eye in the morning, at noon, in the evening, and at bedtime.       polyethylene glycol (MIRALAX  / GLYCOLAX ) 17 g packet Take 17 g by mouth every other day.       potassium chloride  (KLOR-CON  M) 10 MEQ tablet Take 1 tablet (10 mEq total) by mouth daily. 90 tablet 3   senna-docusate (SENOKOT-S) 8.6-50 MG tablet Take 1 tablet by mouth at bedtime as needed for mild constipation.       sennosides-docusate sodium  (SENOKOT-S) 8.6-50 MG tablet Take 1 tablet by mouth daily.          No current facility-administered medications for this visit.      Imaging Results (Last 48 hours)  No results found.     Review of Systems:   A ROS was performed including pertinent positives and negatives as documented in the HPI.     Musculoskeletal Exam:     There were no vitals taken for this visit.   Right hip incision is well-appearing  with staples in place.  Incision is clean dry and intact.  30 degrees internal rotation without significant pain.  Distal neurosensory exam is intact   Imaging:     Xray (2 views right hip): Status post right hip cephalomedullary nail without evidence of complication     I personally reviewed and interpreted the radiographs.     Assessment:   status post right hip nailing overall continuing to improve.  At today's visit she will be activity as tolerated.  X-rays do show evidence of callus formation.  I will plan to see her back as needed   Plan :     - Return to clinic as needed           I personally saw and evaluated the patient, and participated in the management and treatment plan.

## 2024-02-14 ENCOUNTER — Encounter: Payer: Self-pay | Admitting: Sports Medicine

## 2024-02-14 ENCOUNTER — Non-Acute Institutional Stay (SKILLED_NURSING_FACILITY): Admitting: Sports Medicine

## 2024-02-14 DIAGNOSIS — E039 Hypothyroidism, unspecified: Secondary | ICD-10-CM

## 2024-02-14 DIAGNOSIS — Z66 Do not resuscitate: Secondary | ICD-10-CM | POA: Diagnosis not present

## 2024-02-14 DIAGNOSIS — K219 Gastro-esophageal reflux disease without esophagitis: Secondary | ICD-10-CM | POA: Diagnosis not present

## 2024-02-14 DIAGNOSIS — M159 Polyosteoarthritis, unspecified: Secondary | ICD-10-CM

## 2024-02-14 DIAGNOSIS — R350 Frequency of micturition: Secondary | ICD-10-CM

## 2024-02-14 NOTE — Progress Notes (Signed)
 Location:  [facility] Room Number: [phone] A Place of Service:  SNF (31) Provider:  [name], MD  Patient Care Team: [name], MD as PCP - General (Internal Medicine) [name] Ole DASEN, MD as PCP - Electrophysiology (Cardiology) [name], FNP (Hospice and Palliative Medicine)  Extended Emergency Contact Information Primary Emergency Contact: [facility] Phone: [phone] Mobile Phone: [phone] Relation: Daughter Interpreter needed? No Secondary Emergency Contact: Pearman,Karen Address: [address]          [place], [place] [address] [place] Home Phone: [phone] Mobile Phone: [phone] Relation: Daughter  Code Status:  DNR Goals of care: Advanced Directive information    12/09/2023   10:55 AM  Advanced Directives  Does Patient Have a Medical Advance Directive? Yes  Type of Estate agent of [place];Out of facility DNR (pink MOST or yellow form)  Does patient want to make changes to medical advance directive? No - Patient declined  Copy of Healthcare Power of Attorney in Chart? Yes - validated most recent copy scanned in chart (See row information)     Chief Complaint  Patient presents with   Medical Management of Chronic Issues    Routine visit    HPI:  Pt is a 89 y.o. female seen today for medical management of chronic diseases.   Pt seen and examined in her room  Daughter at bedside Pt seems pleasant and comfortable and does not appear to be in distress Pt c/o urinary frequency but denies dysuria, lower abdominal pain, nausea, vomiting.  Pt is wheel chair dependent Denies chest pain , palpitations, abdominal pain,  bloody or dark stools.    Past Medical History:  Diagnosis Date   Aortic stenosis 03/08/2016   Brady-tachy syndrome (HCC)    a. Biotronik dual chamber PPM implanted 2009 b. gen change to STJ dual chamber PPM 2017   Ejection fraction     EF 70%, echo, October, 2009  //   EF 55-60%, septal dyssynergy consistent with a paced rhythm, mild to moderate mitral regurgitation, echo, November, 2015    GERD (gastroesophageal reflux disease) 04/28/2014   Episodes November, 2015 with fluid refluxing from her esophagus.    Hair loss    Patient questioned Coumadin, changed toPradaxa   Hypertension    Hypothyroidism    Lower extremity edema 01/22/2018   Mitral regurgitation 05/03/2014   Mild-to-moderate mitral regurgitation, echo, November, 2015    Osteoarthritis of left knee 10/25/2017   Paroxysmal atrial fibrillation (HCC)    Episodes rapid atrial fib noted by pacemaker interrogation, August, 2011, diltiazem  added, patient improved. Patient continues on Rythmol   //   Changed to flecainide  2013  //   flecainide  level checked in 2013, good level    Persistent atrial fibrillation (HCC)    Presence of permanent cardiac pacemaker    Past Surgical History:  Procedure Laterality Date   ABDOMINAL HYSTERECTOMY     EP IMPLANTABLE DEVICE N/A 08/08/2015   Generator change with SJM Assurity DR PPM by Dr Kelsie   FEMUR IM NAIL Right 11/07/2023   Procedure: INSERTION, INTRAMEDULLARY ROD, FEMUR, RETROGRADE;  Surgeon: Genelle Standing, MD;  Location: [facility] OR;  Service: Orthopedics;  Laterality: Right;   JOINT REPLACEMENT     PACEMAKER PLACEMENT  2009        Allergies  Allergen Reactions   Morphine And Codeine Other (See Comments)    Patient states that she went crazy with this   Penicillins Swelling and Rash  Aspirin      Pacemaker   Clindamycin/Lincomycin Other (See Comments)    Any meds with mycin in the name Unknown reaction Not listed on the [facility]   Crestor  [Rosuvastatin ]     Other reaction(s): muscle pain Not listed on the [facility]   Diltiazem      Skin discoloration, lower extremity swelling Not listed on the [facility]   Erythromycin Other (See Comments)    Any meds with mycin in the name Unknown reaction Not listed on the [facility]    Lisinopril Other (See Comments)    REACTION: Cough   Metronidazole     Other reaction(s): mouth sores, tongue swelling   Morphine    Penicillins Cross Reactors Other (See Comments)    Any meds with mycin in the name Unknown reaction    Outpatient Encounter Medications as of 02/14/2024  Medication Sig   acetaminophen  (TYLENOL ) 325 MG tablet Take 650 mg by mouth every 4 (four) hours as needed for mild pain (pain score 1-3) (or fever).   albuterol  (PROVENTIL ) (2.5 MG/3ML) 0.083% nebulizer solution Take 3 mLs (2.5 mg total) by nebulization every 6 (six) hours as needed for wheezing or shortness of breath.   Amino Acids-Protein Hydrolys (PRO-STAT) LIQD Take 30 mLs by mouth 2 (two) times daily.   amiodarone  (PACERONE ) 200 MG tablet Take 200 mg by mouth daily.   apixaban  (ELIQUIS ) 5 MG TABS tablet Take 1 tablet (5 mg total) by mouth 2 (two) times daily.   Apoaequorin (PREVAGEN) 10 MG CAPS Take 10 mg by mouth daily.   betamethasone , augmented, (DIPROLENE ) 0.05 % gel Apply topically daily.  Apply to scalp topically every 12 hours as needed for psoriasis related to PSORIASIS, UNSPECIFIED (L40.9) apply to affected area of scalp   betamethasone , augmented, (DIPROLENE ) 0.05 % gel Apply 1 application  topically every 12 (twelve) hours as needed.   bisacodyl  (DULCOLAX) 10 MG suppository Place 1 suppository (10 mg total) rectally daily as needed for moderate constipation.   brimonidine -timolol  (COMBIGAN ) 0.2-0.5 % ophthalmic solution Place 1 drop into the right eye every 12 (twelve) hours.   Calcium  Carbonate-Vit D-Min (CALCIUM  600+D PLUS MINERALS) 600-400 MG-UNIT TABS Take 1 tablet by mouth daily.   diclofenac  Sodium (VOLTAREN ) 1 % GEL Apply 1 g topically 2 (two) times daily as needed (to L foot bunion deformity for pain).   furosemide  (LASIX ) 20 MG tablet TAKE ONE TABLET BY MOUTH EVERY DAY   Ipratropium-Albuterol  (COMBIVENT) 20-100 MCG/ACT AERS respimat Inhale 2 puffs into the lungs every 6 (six) hours as  needed for shortness of breath.   Iron , Ferrous Sulfate , 325 (65 Fe) MG TABS Take 325 mg by mouth daily.   lactose free nutrition (BOOST) LIQD Take 237 mLs by mouth 2 (two) times daily between meals.   levothyroxine  (SYNTHROID ) 112 MCG tablet Take 112 mcg by mouth daily before breakfast.   lidocaine  (HM LIDOCAINE  PATCH) 4 % Place 1 patch onto the skin in the morning. Apply topically to L shoulder in the morning. Apply for 12 hours (0900) then removed for 12 hours (2100).   Multiple Vitamin (MULTIVITAMIN ADULT) TABS Take 1 tablet by mouth in the morning.   Multiple Vitamins-Minerals (OCUVITE PO) Take 1 capsule by mouth in the morning.   NAMENDA  10 MG tablet TAKE ONE TABLET BY MOUTH TWICE A DAY FOR COGNITIVE DECLINE   nystatin cream (MYCOSTATIN) Apply 1 Application topically every 12 (twelve) hours as needed (for rash).   ondansetron  (ZOFRAN ) 4 MG tablet Take 1 tablet (4 mg total) by  mouth every 6 (six) hours as needed for nausea.   oxyCODONE  (ROXICODONE ) 5 MG immediate release tablet Take 1 tablet (5 mg total) by mouth every 6 (six) hours as needed for severe pain (pain score 7-10).   pantoprazole (PROTONIX) 40 MG tablet Take 40 mg by mouth daily as needed (for indigestion).   Polyethyl Glyc-Propyl Glyc PF (SYSTANE HYDRATION PF) 0.4-0.3 % SOLN Place 1 drop into the right eye in the morning, at noon, in the evening, and at bedtime.   polyethylene glycol (MIRALAX  / GLYCOLAX ) 17 g packet Take 17 g by mouth every other day.   potassium chloride  (KLOR-CON  M) 10 MEQ tablet Take 1 tablet (10 mEq total) by mouth daily.   senna-docusate (SENOKOT-S) 8.6-50 MG tablet Take 1 tablet by mouth at bedtime as needed for mild constipation.   sennosides-docusate sodium  (SENOKOT-S) 8.6-50 MG tablet Take 1 tablet by mouth daily.   acetaminophen  (TYLENOL ) 500 MG tablet Take 2 tablets (1,000 mg total) by mouth every 8 (eight) hours as needed for moderate pain (pain score 4-6). (Patient not taking: Reported on 02/14/2024)    amiodarone  (PACERONE ) 200 MG tablet Take 1 tablet (200 mg total) by mouth 2 (two) times daily for 7 days, THEN 1 tablet (200 mg total) daily for 7 days. (Patient not taking: No sig reported)   dextromethorphan -guaiFENesin  (MUCINEX  DM) 30-600 MG 12hr tablet Take 1 tablet by mouth 2 (two) times daily. (Patient not taking: Reported on 02/14/2024)   levothyroxine  (SYNTHROID ) 100 MCG tablet TAKE ONE TABLET BY MOUTH EVERY DAY IN THE MORNING (Patient not taking: Reported on 02/14/2024)   mupirocin  ointment (BACTROBAN ) 2 % Apply 1 Application topically 2 (two) times daily. (Patient not taking: Reported on 02/14/2024)   oxyCODONE  (OXY IR/ROXICODONE ) 5 MG immediate release tablet Take 5 mg by mouth 2 (two) times daily. (Patient not taking: Reported on 02/14/2024)   povidone-iodine 10 % swab Apply 1 Application topically daily. (Patient not taking: Reported on 02/14/2024)   No facility-administered encounter medications on file as of 02/14/2024.    Review of Systems  Constitutional:  Negative for chills and fever.  HENT:  Negative for sore throat.   Respiratory:  Negative for cough, shortness of breath and wheezing.   Cardiovascular:  Negative for chest pain and leg swelling.  Gastrointestinal:  Negative for abdominal distention, abdominal pain, blood in stool, constipation, diarrhea, nausea and vomiting.  Genitourinary:  Positive for frequency. Negative for dysuria.  Neurological:  Negative for dizziness.    Immunization History  Administered Date(s) Administered   Fluad Quad(high Dose 65+) 04/02/2022   Fluzone Influenza virus vaccine,trivalent (IIV3), split virus 02/24/2019, 03/08/2020, 03/25/2021   INFLUENZA, HIGH DOSE SEASONAL PF 03/08/2015, 04/03/2017, 03/17/2018, 03/08/2020, 04/15/2023   Influenza Split 03/11/2009, 03/11/2010, 02/25/2011   Influenza,inj,Quad PF,6+ Mos 03/08/2016   Influenza,inj,quad, With Preservative 03/11/2017   Moderna Covid-19 Vaccine  Bivalent Booster 4yrs & up 10/27/2021, 07/11/2023    Moderna SARS-COV2 Booster Vaccination 11/08/2020   Moderna Sars-Covid-2 Vaccination 06/15/2019, 07/13/2019, 04/19/2020, 02/27/2022   PNEUMOCOCCAL CONJUGATE-20 06/27/2023   Pfizer Covid-19 Vaccine Bivalent Booster 54yrs & up 02/28/2021, 04/12/2022   Pneumococcal Conjugate-13 12/06/2014   Pneumococcal Polysaccharide-23 05/13/2001, 03/11/2009   Polio, Unspecified 02/02/1958   Tdap 11/21/2011, 04/11/2023   Zoster Recombinant(Shingrix) 07/18/2022, 10/19/2022   Pertinent  Health Maintenance Due  Topic Date Due   Influenza Vaccine  03/11/2024 (Originally 01/10/2024)   DEXA SCAN  Completed      02/16/2022    9:45 AM 02/27/2022   11:41 AM 07/06/2022   10:04  AM 07/13/2022    3:55 PM 03/25/2023    4:51 PM  Fall Risk  Falls in the past year?   0 0 1  Was there an injury with Fall?   0 0 0  Fall Risk Category Calculator   0 0 1  (RETIRED) Patient Fall Risk Level High fall risk  High fall risk      Patient at Risk for Falls Due to  History of fall(s) No Fall Risks No Fall Risks History of fall(s);Impaired balance/gait;Impaired mobility  Fall risk Follow up  Falls evaluation completed   Falls evaluation completed Falls evaluation completed;Falls prevention discussed;Education provided     Data saved with a previous flowsheet row definition   Functional Status Survey:    Vitals:   02/14/24 1208  BP: (S) (!) 145/65  Pulse: 65  SpO2: 94%  Weight: 185 lb 4.8 oz (84.1 kg)  Height: 5' 3 (1.6 m)   Body mass index is 32.82 kg/m. Physical Exam Constitutional:      Appearance: Normal appearance.  HENT:     Head: Normocephalic and atraumatic.  Cardiovascular:     Rate and Rhythm: Normal rate and regular rhythm.  Pulmonary:     Effort: Pulmonary effort is normal. No respiratory distress.     Breath sounds: Normal breath sounds. No wheezing.  Abdominal:     General: Bowel sounds are normal. There is no distension.     Tenderness: There is no abdominal tenderness. There is no guarding or  rebound.     Comments:    Musculoskeletal:        General: No swelling.  Neurological:     Mental Status: She is alert. Mental status is at baseline.     Motor: No weakness.     Labs reviewed: Recent Labs    11/10/23 1820 11/11/23 0505 11/12/23 0608 11/19/23 0000 12/10/23 0000  NA 141 139 138 136* 138  K 3.5 3.4* 4.9 4.0  --   CL 99 101 102 102 102  CO2 30 31 31  28* 26*  GLUCOSE 123* 121* 105*  --   --   BUN 26* 27* 20 18 16   CREATININE 0.84 0.68 0.70 0.8 0.8  CALCIUM  8.4* 8.0* 8.1* 8.3* 8.9  MG 2.1 2.1 2.2  --   --   PHOS 2.0* 2.6 2.4*  --   --    Recent Labs    11/10/23 1820 11/11/23 0505 11/12/23 0608 11/19/23 0000 01/07/24 0000  AST 31 30 32 16 25  ALT 17 16 18 9 14   ALKPHOS 39 36* 38 92 102  BILITOT 0.8 1.3* 1.7*  --   --   PROT 5.5* 5.1* 5.2*  --   --   ALBUMIN 2.7* 2.6* 2.6* 3.1* 3.7   Recent Labs    11/11/23 0505 11/12/23 0608 11/19/23 0000 11/22/23 1039 12/10/23 0000  WBC 6.9 6.8 6.3 7.3 5.4  NEUTROABS 4.1 3.9  --   --  3,289.00  HGB 7.9* 7.6* 10.1* 11.4 12.8  HCT 24.7* 23.8* 32* 36.2 41  MCV 105.6* 105.3*  --  105*  --   PLT 126* 142* 202 240 143*   Lab Results  Component Value Date   TSH 22.79 (A) 01/07/2024   Lab Results  Component Value Date   HGBA1C 4.8 11/08/2023   Lab Results  Component Value Date   CHOL 159 09/14/2023   HDL 42 09/14/2023   LDLCALC 100 (H) 09/14/2023   TRIG 85 09/14/2023   CHOLHDL 3.8  09/14/2023    Significant Diagnostic Results in last 30 days:  DG FEMUR, MIN 2 VIEWS RIGHT Result Date: 01/18/2024 CLINICAL DATA:  Postop.  Intertrochanteric right femur fracture. EXAM: RIGHT FEMUR 2 VIEWS COMPARISON:  Radiograph 11/20/2023 FINDINGS: Femoral intramedullary nail with trans trochanteric and distal locking screw fixation, intact hardware. Unchanged intra trochanteric fracture with persistent displacement of the lesser trochanteric fragment. Slightly decreasing conspicuity of the fracture line with some incomplete  surrounding callus formation. Previous knee arthroplasty. Peripheral vascular calcifications are seen. IMPRESSION: Unchanged alignment of intertrochanteric fracture post ORIF. Slightly decreasing conspicuity of the fracture line with some incomplete surrounding callus formation. Electronically Signed   By: Andrea Gasman M.D.   On: 01/18/2024 17:42    Assessment/Plan   Afib On amiodarone , eliquis   No signs of bleeding   Hypothyroidism Cont with synthyroid  Anemia Cont with iron  supplements  Osteoarthritis Cont with oxycodone    Urinary frequency  Will check ua, culture   H/o Rt hip fracture  Denies pain  Recently followed with ortho   GERD  Cont with pantoprazole

## 2024-02-15 DIAGNOSIS — R300 Dysuria: Secondary | ICD-10-CM | POA: Diagnosis not present

## 2024-02-18 ENCOUNTER — Non-Acute Institutional Stay (SKILLED_NURSING_FACILITY): Payer: Self-pay | Admitting: Nurse Practitioner

## 2024-02-18 ENCOUNTER — Encounter: Payer: Self-pay | Admitting: Nurse Practitioner

## 2024-02-18 DIAGNOSIS — E039 Hypothyroidism, unspecified: Secondary | ICD-10-CM

## 2024-02-18 DIAGNOSIS — N1831 Chronic kidney disease, stage 3a: Secondary | ICD-10-CM | POA: Diagnosis not present

## 2024-02-18 DIAGNOSIS — L89610 Pressure ulcer of right heel, unstageable: Secondary | ICD-10-CM

## 2024-02-18 DIAGNOSIS — D509 Iron deficiency anemia, unspecified: Secondary | ICD-10-CM

## 2024-02-18 DIAGNOSIS — I4819 Other persistent atrial fibrillation: Secondary | ICD-10-CM

## 2024-02-18 DIAGNOSIS — F0390 Unspecified dementia without behavioral disturbance: Secondary | ICD-10-CM

## 2024-02-18 DIAGNOSIS — N390 Urinary tract infection, site not specified: Secondary | ICD-10-CM | POA: Diagnosis not present

## 2024-02-18 DIAGNOSIS — R1319 Other dysphagia: Secondary | ICD-10-CM | POA: Diagnosis not present

## 2024-02-18 DIAGNOSIS — K219 Gastro-esophageal reflux disease without esophagitis: Secondary | ICD-10-CM

## 2024-02-18 DIAGNOSIS — I10 Essential (primary) hypertension: Secondary | ICD-10-CM

## 2024-02-18 LAB — TSH: TSH: 12.82 — AB (ref 0.41–5.90)

## 2024-02-18 NOTE — Progress Notes (Signed)
 Location:  [facility] Room Number: [phone] A Place of Service:  SNF (31) Provider: Canyon Willow X, NP   Patient Care Team: [name], MD as PCP - General (Internal Medicine) [name], MD as PCP - Electrophysiology (Cardiology) [name], FNP [facility] and Palliative Medicine)  Extended Emergency Contact Information Primary Emergency Contact: [facility] Phone: [phone] Mobile Phone: [phone] Relation: Daughter Interpreter needed? No Secondary Emergency Contact: Pearman,Karen Address: [address]          [place], [place] [address] [place] Home Phone: [phone] Mobile Phone: [phone] Relation: Daughter  Code Status:  DNR Goals of care: Advanced Directive information    12/09/2023   10:55 AM  Advanced Directives  Does Patient Have a Medical Advance Directive? Yes  Type of Estate agent of [employer];Out of facility DNR (pink MOST or yellow form)  Does patient want to make changes to medical advance directive? No - Patient declined  Copy of Healthcare Power of Attorney in Chart? Yes - validated most recent copy scanned in chart (See row information)     Chief Complaint  Patient presents with   Urinary Tract Infection    HPI:  Pt is a 89 y.o. female seen today for an acute visit for UTI, urine culture from 02/15/2024 showed E. coli greater than 100,000 C/AML, susceptible to Nitrofurantoin     Hospitalized 11/06/23-11/12/23 for closed displaced intertrochanteric fx of R femur, s/p IM, WBAT. Hospital stay was complicated with acute blood loss anemia and acute respiratory failure with hypoxia.                BRBPR 05/23/23, FOBT positive 05/24/23, noted hemorrhoids, placed PPI, Hgb 12.8 12/10/23              01/11/23 family expressed concerning of hand and fingers tingling and numbness, desires checking TSH 4.38 01/26/23 If Namenda  is the culprit. The patient stated her tingling and  numbness in hands and fingers has been the same, declined workup or neurology referral.              R heel, pressure ulcer, improving, no signs symptoms of infection             Chronic numbness of R+L hands, has been using fingerless gloves which helped, declined workup, neurology referral, or medication             Anemia, post op, on Iron , Hgb 12.8 12/10/23             Senile dementia, tolerated Namenda  well, helped with her mood as well. MMSE 27/30 04/09/23, CT head 02/12/22 Mild age-related atrophy and chronic microvascular ischemic changes.             Afib, off Metoprolol , on Amiodarone , followed by Cardiology, on  Eliquis .             AS moderate             HTN off Metoprolol  05/27/23 2/2 low Bp, on Furosemide ,  f/u Cardiology. Bun/creat 16/0.8 12/10/23             Pacemaker, SSS,  f/u Cardiology             CHF, AS, edema BLE, on Furosemide . EF 70-75% 09/19/21, 03/27/22 Venous US , negative DVT. 03/30/22 arterial US  mild to moderate obstructive disease BLE             CKD Bun/creat 16/0.8 12/10/23  Dysphagia, nectar liquid.              Hypothyroidism, increased Levothyroxine  1167mcg/112mcg 02/14/24 TSH 4.206 11/07/23<<12.82 02/18/24, TSH 6 weeks.              OA, takes Tylenol , ambulates with walker prior to fx of R hip 11/06/23             GERD, stable, on Pantoprazole.              Constipation, taking Senokot S, MiraLax  Past Medical History:  Diagnosis Date   Aortic stenosis 03/08/2016   Brady-tachy syndrome (HCC)    a. Biotronik dual chamber PPM implanted 2009 b. gen change to STJ dual chamber PPM 2017   Ejection fraction    EF 70%, echo, October, 2009  //   EF 55-60%, septal dyssynergy consistent with a paced rhythm, mild to moderate mitral regurgitation, echo, November, 2015    GERD (gastroesophageal reflux disease) 04/28/2014   Episodes November, 2015 with fluid refluxing from her esophagus.    Hair loss    Patient questioned Coumadin, changed toPradaxa   Hypertension     Hypothyroidism    Lower extremity edema 01/22/2018   Mitral regurgitation 05/03/2014   Mild-to-moderate mitral regurgitation, echo, November, 2015    Osteoarthritis of left knee 10/25/2017   Paroxysmal atrial fibrillation (HCC)    Episodes rapid atrial fib noted by pacemaker interrogation, August, 2011, diltiazem  added, patient improved. Patient continues on Rythmol   //   Changed to flecainide  2013  //   flecainide  level checked in 2013, good level    Persistent atrial fibrillation (HCC)    Presence of permanent cardiac pacemaker    Past Surgical History:  Procedure Laterality Date   ABDOMINAL HYSTERECTOMY     EP IMPLANTABLE DEVICE N/A 08/08/2015   Generator change with SJM Assurity DR PPM by Dr Kelsie   FEMUR IM NAIL Right 11/07/2023   Procedure: INSERTION, INTRAMEDULLARY ROD, FEMUR, RETROGRADE;  Surgeon: Genelle Standing, MD;  Location: [facility] OR;  Service: Orthopedics;  Laterality: Right;   JOINT REPLACEMENT     PACEMAKER PLACEMENT  2009          Outpatient Encounter Medications as of 02/18/2024  Medication Sig   acetaminophen  (TYLENOL ) 325 MG tablet Take 650 mg by mouth every 4 (four) hours as needed for mild pain (pain score 1-3) (or fever).   albuterol  (PROVENTIL ) (2.5 MG/3ML) 0.083% nebulizer solution Take 3 mLs (2.5 mg total) by nebulization every 6 (six) hours as needed for wheezing or shortness of breath.   Amino Acids-Protein Hydrolys (PRO-STAT) LIQD Take 30 mLs by mouth 2 (two) times daily.   amiodarone  (PACERONE ) 200 MG tablet Take 200 mg by mouth daily.   apixaban  (ELIQUIS ) 5 MG TABS tablet Take 1 tablet (5 mg total) by mouth 2 (two) times daily.   Apoaequorin (PREVAGEN) 10 MG CAPS Take 10 mg by mouth daily.   betamethasone , augmented, (DIPROLENE ) 0.05 % gel Apply 1 application  topically every 12 (twelve) hours as needed.   bisacodyl  (DULCOLAX) 10 MG suppository Place 1 suppository (10 mg total) rectally daily as needed for moderate constipation.   brimonidine -timolol   (COMBIGAN ) 0.2-0.5 % ophthalmic solution Place 1 drop into the right eye every 12 (twelve) hours.   Calcium  Carbonate-Vit D-Min (CALCIUM  600+D PLUS MINERALS) 600-400 MG-UNIT TABS Take 1 tablet by mouth daily.   ciprofloxacin  (CIPRO ) 500 MG tablet Take 500 mg by mouth 2 (two) times daily. Give 500 mg by mouth every 12 hours for UTI  until 02/24/2024 23:59   ciprofloxacin  (CIPRO ) 500 MG tablet Take 500 mg by mouth as directed. Give 500 mg by mouth one time only for UTI for 1 Day   diclofenac  Sodium (VOLTAREN ) 1 % GEL Apply 1 g topically 2 (two) times daily as needed (to L foot bunion deformity for pain).   furosemide  (LASIX ) 20 MG tablet TAKE ONE TABLET BY MOUTH EVERY DAY   Ipratropium-Albuterol  (COMBIVENT) 20-100 MCG/ACT AERS respimat Inhale 1 puff into the lungs every 6 (six) hours as needed for shortness of breath.   Iron , Ferrous Sulfate , 325 (65 Fe) MG TABS Take 325 mg by mouth daily.   lactose free nutrition (BOOST) LIQD Take 237 mLs by mouth 2 (two) times daily between meals.   levothyroxine  (SYNTHROID ) 112 MCG tablet Take 112 mcg by mouth daily before breakfast.   lidocaine  (HM LIDOCAINE  PATCH) 4 % Place 1 patch onto the skin in the morning. Apply topically to L shoulder in the morning. Apply for 12 hours (0900) then removed for 12 hours (2100).   Multiple Vitamin (MULTIVITAMIN ADULT) TABS Take 1 tablet by mouth in the morning.   Multiple Vitamins-Minerals (OCUVITE PO) Take 1 capsule by mouth in the morning.   NAMENDA  10 MG tablet TAKE ONE TABLET BY MOUTH TWICE A DAY FOR COGNITIVE DECLINE   nystatin cream (MYCOSTATIN) Apply 1 Application topically every 12 (twelve) hours as needed (for rash).   ondansetron  (ZOFRAN ) 4 MG tablet Take 1 tablet (4 mg total) by mouth every 6 (six) hours as needed for nausea.   oxyCODONE  (ROXICODONE ) 5 MG immediate release tablet Take 1 tablet (5 mg total) by mouth every 6 (six) hours as needed for severe pain (pain score 7-10).   pantoprazole (PROTONIX) 40 MG  tablet Take 40 mg by mouth daily as needed (for indigestion).   Polyethyl Glyc-Propyl Glyc PF (SYSTANE HYDRATION PF) 0.4-0.3 % SOLN Place 1 drop into the right eye in the morning, at noon, in the evening, and at bedtime.   polyethylene glycol (MIRALAX  / GLYCOLAX ) 17 g packet Take 17 g by mouth every other day.   potassium chloride  (KLOR-CON  M) 10 MEQ tablet Take 1 tablet (10 mEq total) by mouth daily.   senna-docusate (SENOKOT-S) 8.6-50 MG tablet Take 1 tablet by mouth at bedtime as needed for mild constipation.   sennosides-docusate sodium  (SENOKOT-S) 8.6-50 MG tablet Take 1 tablet by mouth daily.   [DISCONTINUED] acetaminophen  (TYLENOL ) 500 MG tablet Take 2 tablets (1,000 mg total) by mouth every 8 (eight) hours as needed for moderate pain (pain score 4-6). (Patient not taking: Reported on 02/18/2024)   [DISCONTINUED] amiodarone  (PACERONE ) 200 MG tablet Take 1 tablet (200 mg total) by mouth 2 (two) times daily for 7 days, THEN 1 tablet (200 mg total) daily for 7 days. (Patient not taking: No sig reported)   [DISCONTINUED] betamethasone , augmented, (DIPROLENE ) 0.05 % gel Apply topically daily.  Apply to scalp topically every 12 hours as needed for psoriasis related to PSORIASIS, UNSPECIFIED (L40.9) apply to affected area of scalp (Patient not taking: Reported on 02/18/2024)   [DISCONTINUED] dextromethorphan -guaiFENesin  (MUCINEX  DM) 30-600 MG 12hr tablet Take 1 tablet by mouth 2 (two) times daily. (Patient not taking: Reported on 02/18/2024)   [DISCONTINUED] levothyroxine  (SYNTHROID ) 100 MCG tablet TAKE ONE TABLET BY MOUTH EVERY DAY IN THE MORNING (Patient not taking: Reported on 02/18/2024)   [DISCONTINUED] mupirocin  ointment (BACTROBAN ) 2 % Apply 1 Application topically 2 (two) times daily. (Patient not taking: Reported on 02/18/2024)   [DISCONTINUED] oxyCODONE  (OXY  IR/ROXICODONE ) 5 MG immediate release tablet Take 5 mg by mouth 2 (two) times daily. (Patient not taking: Reported on 02/18/2024)   [DISCONTINUED]  povidone-iodine 10 % swab Apply 1 Application topically daily. (Patient not taking: Reported on 02/18/2024)   No facility-administered encounter medications on file as of 02/18/2024.    Review of Systems  Constitutional:  Negative for appetite change, fatigue and fever.  HENT:  Positive for hearing loss and trouble swallowing. Negative for congestion.   Eyes:  Negative for visual disturbance.  Respiratory:  Negative for cough, chest tightness and shortness of breath.   Cardiovascular:  Positive for leg swelling. Negative for chest pain and palpitations.  Gastrointestinal:  Negative for abdominal pain and constipation.  Genitourinary:  Negative for dysuria, frequency, hematuria and urgency.  Musculoskeletal:  Positive for arthralgias and gait problem.       Knees, L>R, intermittent in nature.  Left shoulder pain with ROM  Skin:  Positive for wound.       Chronic venous insufficiency skin changes: mild erythema in mid of R+L Right heel pressure ulcer wound   Neurological:  Positive for numbness. Negative for tremors, speech difficulty, weakness and headaches.       Memory lapses. chronic numbness in fingers.   Psychiatric/Behavioral:  Negative for confusion and sleep disturbance. The patient is not nervous/anxious.     Immunization History  Administered Date(s) Administered   Fluad Quad(high Dose 65+) 04/02/2022   Fluzone Influenza virus vaccine,trivalent (IIV3), split virus 02/24/2019, 03/08/2020, 03/25/2021   INFLUENZA, HIGH DOSE SEASONAL PF 03/08/2015, 04/03/2017, 03/17/2018, 03/08/2020, 04/15/2023   Influenza Split 03/11/2009, 03/11/2010, 02/25/2011   Influenza,inj,Quad PF,6+ Mos 03/08/2016   Influenza,inj,quad, With Preservative 03/11/2017   Moderna Covid-19 Vaccine  Bivalent Booster 3yrs & up 10/27/2021, 07/11/2023   Moderna SARS-COV2 Booster Vaccination 11/08/2020   Moderna Sars-Covid-2 Vaccination 06/15/2019, 07/13/2019, 04/19/2020, 02/27/2022   PNEUMOCOCCAL CONJUGATE-20  06/27/2023   Pfizer Covid-19 Vaccine Bivalent Booster 102yrs & up 02/28/2021, 04/12/2022   Pneumococcal Conjugate-13 12/06/2014   Pneumococcal Polysaccharide-23 05/13/2001, 03/11/2009   Polio, Unspecified 02/02/1958   Tdap 11/21/2011, 04/11/2023   Zoster Recombinant(Shingrix) 07/18/2022, 10/19/2022   Pertinent  Health Maintenance Due  Topic Date Due   Influenza Vaccine  03/11/2024 (Originally 01/10/2024)   DEXA SCAN  Completed      02/16/2022    9:45 AM 02/27/2022   11:41 AM 07/06/2022   10:04 AM 07/13/2022    3:55 PM 03/25/2023    4:51 PM  Fall Risk  Falls in the past year?   0 0 1  Was there an injury with Fall?   0 0 0  Fall Risk Category Calculator   0 0 1  (RETIRED) Patient Fall Risk Level High fall risk  High fall risk      Patient at Risk for Falls Due to  History of fall(s) No Fall Risks No Fall Risks History of fall(s);Impaired balance/gait;Impaired mobility  Fall risk Follow up  Falls evaluation completed   Falls evaluation completed Falls evaluation completed;Falls prevention discussed;Education provided     Data saved with a previous flowsheet row definition   Functional Status Survey:    Vitals:   02/18/24 1355  BP: (!) 145/65  Pulse: 65  Resp: 18  Temp: 97.7 F (36.5 C)  SpO2: 94%  Weight: 185 lb 4.8 oz (84.1 kg)  Height: 5' 3 (1.6 m)   Body mass index is 32.82 kg/m. Physical Exam Vitals and nursing note reviewed.  Constitutional:      Appearance: Normal appearance.  HENT:     Head: Normocephalic and atraumatic.     Nose: Nose normal.     Mouth/Throat:     Mouth: Mucous membranes are moist.  Eyes:     Extraocular Movements: Extraocular movements intact.     Conjunctiva/sclera: Conjunctivae normal.     Pupils: Pupils are equal, round, and reactive to light.  Cardiovascular:     Rate and Rhythm: Normal rate. Rhythm irregular.     Heart sounds: Murmur heard.     Comments: No dorsalis pedis pulses felt from previous examination.  Pulmonary:      Effort: Pulmonary effort is normal.     Breath sounds: No rales.  Abdominal:     General: Bowel sounds are normal.     Palpations: Abdomen is soft.     Tenderness: There is no abdominal tenderness. There is no right CVA tenderness, left CVA tenderness, guarding or rebound.  Genitourinary:    Comments: External hemorrhoids from previous examination Musculoskeletal:        General: Tenderness present. Normal range of motion.     Cervical back: Normal range of motion and neck supple.     Right lower leg: Edema present.     Left lower leg: Edema present.     Comments: Trace edema LLE, 1+ edema RLE Left shoulder pain with ROM R hip pain  Skin:    General: Skin is warm and dry.     Findings: No erythema.     Comments: Mild dark erythema mid of R+L lower legs, chronic.   R hip surgical scar Right heel pressure ulcer wound, healing stage II, no signs symptoms of infection   Neurological:     General: No focal deficit present.     Mental Status: She is alert and oriented to person, place, and time. Mental status is at baseline.     Gait: Gait abnormal.     Comments: 5/5 muscle strength in fingers.   Psychiatric:        Mood and Affect: Mood normal.        Behavior: Behavior normal.     Labs reviewed: Recent Labs    11/10/23 1820 11/11/23 0505 11/12/23 0608 11/19/23 0000 12/10/23 0000  NA 141 139 138 136* 138  K 3.5 3.4* 4.9 4.0  --   CL 99 101 102 102 102  CO2 30 31 31  28* 26*  GLUCOSE 123* 121* 105*  --   --   BUN 26* 27* 20 18 16   CREATININE 0.84 0.68 0.70 0.8 0.8  CALCIUM  8.4* 8.0* 8.1* 8.3* 8.9  MG 2.1 2.1 2.2  --   --   PHOS 2.0* 2.6 2.4*  --   --    Recent Labs    11/10/23 1820 11/11/23 0505 11/12/23 0608 11/19/23 0000 01/07/24 0000  AST 31 30 32 16 25  ALT 17 16 18 9 14   ALKPHOS 39 36* 38 92 102  BILITOT 0.8 1.3* 1.7*  --   --   PROT 5.5* 5.1* 5.2*  --   --   ALBUMIN 2.7* 2.6* 2.6* 3.1* 3.7   Recent Labs    11/11/23 0505 11/12/23 0608 11/19/23 0000  11/22/23 1039 12/10/23 0000  WBC 6.9 6.8 6.3 7.3 5.4  NEUTROABS 4.1 3.9  --   --  3,289.00  HGB 7.9* 7.6* 10.1* 11.4 12.8  HCT 24.7* 23.8* 32* 36.2 41  MCV 105.6* 105.3*  --  105*  --   PLT 126* 142* 202 240 143*  Lab Results  Component Value Date   TSH 12.82 (A) 02/18/2024   Lab Results  Component Value Date   HGBA1C 4.8 11/08/2023   Lab Results  Component Value Date   CHOL 159 09/14/2023   HDL 42 09/14/2023   LDLCALC 100 (H) 09/14/2023   TRIG 85 09/14/2023   CHOLHDL 3.8 09/14/2023    Significant Diagnostic Results in last 30 days:  No results found.  Assessment/Plan UTI (urinary tract infection) urine culture from 02/15/2024 showed E. coli greater than 100,000 C/AML, susceptible to nitrofurantoin  Nitrofurantoin  100 mg by mouth 2 times daily for 7 days  Hypothyroidism increase Levothyroxine  137mcg/112mcg 02/14/24 TSH 4.206 11/07/23<<12.82 02/18/24, TSH 6 weeks.   Persistent atrial fibrillation (HCC) Heart rate is controlled, off Metoprolol , on Amiodarone , followed by Cardiology, on  Eliquis .  Hypertension Blood pressure is controlled, off Metoprolol  05/27/23 2/2 low Bp, on Furosemide ,  f/u Cardiology. Bun/creat 16/0.8 12/10/23  Congestive heart failure (HCC) Euvolemic, AS, edema BLE, on Furosemide . EF 70-75% 09/19/21, 03/27/22 Venous US , negative DVT. 03/30/22 arterial US  mild to moderate obstructive disease BLE  Pressure ulcer of right heel pressure ulcer, improving, no signs symptoms of infection  IDA (iron  deficiency anemia) post op, on Iron , Hgb 12.8 12/10/23  Major neurocognitive disorder (HCC) tolerated Namenda  well, helped with her mood as well. MMSE 27/30 04/09/23, CT head 02/12/22 Mild age-related atrophy and chronic microvascular ischemic changes.  CKD (chronic kidney disease) stage 3, GFR 30-59 ml/min (HCC) Bun/creat 16/0.8 12/10/23  Esophageal dysphagia nectar liquid.   GERD (gastroesophageal reflux disease) stable, on Pantoprazole.     Family/ staff  Communication: Plan of care reviewed with the patient and charge nurse  Labs/tests ordered: TSH in 6 weeks

## 2024-02-18 NOTE — Assessment & Plan Note (Signed)
 Heart rate is controlled, off Metoprolol , on Amiodarone , followed by Cardiology, on  Eliquis .

## 2024-02-18 NOTE — Assessment & Plan Note (Signed)
nectar liquid 

## 2024-02-18 NOTE — Assessment & Plan Note (Signed)
 pressure ulcer, improving, no signs symptoms of infection

## 2024-02-18 NOTE — Assessment & Plan Note (Signed)
 Euvolemic, AS, edema BLE, on Furosemide . EF 70-75% 09/19/21, 03/27/22 Venous US , negative DVT. 03/30/22 arterial US  mild to moderate obstructive disease BLE

## 2024-02-18 NOTE — Assessment & Plan Note (Signed)
 Blood pressure is controlled, off Metoprolol  05/27/23 2/2 low Bp, on Furosemide ,  f/u Cardiology. Bun/creat 16/0.8 12/10/23

## 2024-02-18 NOTE — Assessment & Plan Note (Signed)
 post op, on Iron , Hgb 12.8 12/10/23

## 2024-02-18 NOTE — Assessment & Plan Note (Signed)
 Bun/creat 16/0.8 12/10/23

## 2024-02-18 NOTE — Assessment & Plan Note (Signed)
stable, on Pantoprazole  

## 2024-02-18 NOTE — Assessment & Plan Note (Addendum)
 increase Levothyroxine  147mcg/112mcg 02/14/24 TSH 4.206 11/07/23<<12.82 02/18/24, TSH 6 weeks.

## 2024-02-18 NOTE — Assessment & Plan Note (Signed)
 tolerated Namenda well, helped with her mood as well. MMSE 27/30 04/09/23, CT head 02/12/22 Mild age-related atrophy and chronic microvascular ischemic changes.

## 2024-02-18 NOTE — Assessment & Plan Note (Addendum)
 urine culture from 02/15/2024 showed E. coli greater than 100,000 C/AML, susceptible to nitrofurantoin  Nitrofurantoin  100 mg by mouth 2 times daily for 7 days

## 2024-02-26 ENCOUNTER — Telehealth: Payer: Self-pay | Admitting: Orthopedic Surgery

## 2024-02-26 DIAGNOSIS — R300 Dysuria: Secondary | ICD-10-CM | POA: Diagnosis not present

## 2024-02-26 MED ORDER — MELATONIN 5 MG PO TABS: 5.0000 mg | ORAL_TABLET | Freq: Every day | ORAL | Status: AC

## 2024-02-26 NOTE — Telephone Encounter (Signed)
 Nursing reports episodes of crying and hallucinations. Recently prescribed nitrofurantoin  x 7 days due to UTI. Urine culture > 100,000 cfu/mL E.coli. She has completed antibiotics. Plan to repeat UA/culture. Family also requested sleeping aid. Melatonin 5 mg at bedtime ordered.

## 2024-03-03 DIAGNOSIS — N390 Urinary tract infection, site not specified: Secondary | ICD-10-CM | POA: Diagnosis not present

## 2024-03-04 ENCOUNTER — Non-Acute Institutional Stay (SKILLED_NURSING_FACILITY): Payer: Self-pay | Admitting: Nurse Practitioner

## 2024-03-04 ENCOUNTER — Encounter: Payer: Self-pay | Admitting: Nurse Practitioner

## 2024-03-04 ENCOUNTER — Encounter: Payer: Self-pay | Admitting: *Deleted

## 2024-03-04 DIAGNOSIS — I4819 Other persistent atrial fibrillation: Secondary | ICD-10-CM

## 2024-03-04 DIAGNOSIS — F0390 Unspecified dementia without behavioral disturbance: Secondary | ICD-10-CM | POA: Diagnosis not present

## 2024-03-04 DIAGNOSIS — K219 Gastro-esophageal reflux disease without esophagitis: Secondary | ICD-10-CM

## 2024-03-04 DIAGNOSIS — D509 Iron deficiency anemia, unspecified: Secondary | ICD-10-CM | POA: Diagnosis not present

## 2024-03-04 DIAGNOSIS — F339 Major depressive disorder, recurrent, unspecified: Secondary | ICD-10-CM | POA: Insufficient documentation

## 2024-03-04 DIAGNOSIS — I10 Essential (primary) hypertension: Secondary | ICD-10-CM

## 2024-03-04 DIAGNOSIS — I509 Heart failure, unspecified: Secondary | ICD-10-CM | POA: Diagnosis not present

## 2024-03-04 NOTE — Assessment & Plan Note (Signed)
 post op, on Iron , Hgb 12.8 12/10/23

## 2024-03-04 NOTE — Assessment & Plan Note (Signed)
 Heart rate is in control, off Metoprolol , on Amiodarone , followed by Cardiology, on  Eliquis .             AS moderate

## 2024-03-04 NOTE — Assessment & Plan Note (Signed)
 Pacemaker, SSS,  f/u Cardiology             CHF, AS, edema BLE, on Furosemide . EF 70-75% 09/19/21, 03/27/22 Venous US , negative DVT. 03/30/22 arterial US  mild to moderate obstructive disease

## 2024-03-04 NOTE — Assessment & Plan Note (Signed)
stable, on Pantoprazole  

## 2024-03-04 NOTE — Progress Notes (Signed)
 Location:   [facility] Room Number: 15 Place of Service:  SNF (31) Provider: Larwance Jahmil Macleod NP  [name], MD  Patient Care Team: [name], MD as PCP - General (Internal Medicine) [name], MD as PCP - Electrophysiology (Cardiology) [name], FNP [facility])  Extended Emergency Contact Information Primary Emergency Contact: [facility] Phone: [phone] Mobile Phone: [phone] Relation: Daughter Interpreter needed? No Secondary Emergency Contact: Pearman,Karen Address: [address]          [place], [place] [address] [place] Home Phone: [phone] Mobile Phone: [phone] Relation: Daughter  Code Status: DNR Goals of care: Advanced Directive information    12/09/2023   10:55 AM  Advanced Directives  Does Patient Have a Medical Advance Directive? Yes  Type of Estate agent of [place];Out of facility DNR (pink MOST or yellow form)  Does patient want to make changes to medical advance directive? No - Patient declined  Copy of Healthcare Power of Attorney in Chart? Yes - validated most recent copy scanned in chart (See row information)     Chief Complaint  Patient presents with   Acute Visit    Depression    HPI:  Pt is a 89 y.o. female seen today for an acute visit for the patient's daughter reported that the patient is feeling down at times, desire to try antidepressant, scored 2 on recent PHQ-9.   Hospitalized 11/06/23-11/12/23 for closed displaced intertrochanteric fx of R femur, s/p IM, WBAT. Hospital stay was complicated with acute blood loss anemia and acute respiratory failure with hypoxia.                BRBPR 05/23/23, FOBT positive 05/24/23, noted hemorrhoids, placed PPI, Hgb 12.8 12/10/23              01/11/23 family expressed concerning of hand and fingers tingling and numbness, desires checking TSH 4.38 01/26/23 If Namenda  is the culprit. The  patient stated her tingling and numbness in hands and fingers has been the same, declined workup or neurology referral.              R heel, pressure ulcer, improving, no signs symptoms of infection             Chronic numbness of R+L hands, has been using fingerless gloves which helped, declined workup, neurology referral, or medication             Anemia, post op, on Iron , Hgb 12.8 12/10/23             Senile dementia, tolerated Namenda  well, helped with her mood as well. MMSE 27/30 04/09/23, CT head 02/12/22 Mild age-related atrophy and chronic microvascular ischemic changes.             Afib, off Metoprolol , on Amiodarone , followed by Cardiology, on  Eliquis .             AS moderate             HTN off Metoprolol  05/27/23 2/2 low Bp, on Furosemide ,  f/u Cardiology. Bun/creat 16/0.8 12/10/23             Pacemaker, SSS,  f/u Cardiology             CHF, AS, edema BLE, on Furosemide . EF 70-75% 09/19/21, 03/27/22 Venous US , negative DVT. 03/30/22 arterial US  mild to moderate obstructive disease             CKD Bun/creat 16/0.8 12/10/23  Dysphagia, nectar liquid.              Hypothyroidism, increased Levothyroxine  117mcg/112mcg 02/14/24 TSH 4.206 11/07/23<<12.82 02/18/24, TSH 6 weeks.              OA, takes Tylenol , ambulates with walker prior to fx of R hip 11/06/23             GERD, stable, on Pantoprazole.              Constipation, taking Senokot S, MiraLax    Past Medical History:  Diagnosis Date   Aortic stenosis 03/08/2016   Brady-tachy syndrome (HCC)    a. Biotronik dual chamber PPM implanted 2009 b. gen change to STJ dual chamber PPM 2017   Ejection fraction    EF 70%, echo, October, 2009  //   EF 55-60%, septal dyssynergy consistent with a paced rhythm, mild to moderate mitral regurgitation, echo, November, 2015    GERD (gastroesophageal reflux disease) 04/28/2014   Episodes November, 2015 with fluid refluxing from her esophagus.    Hair loss    Patient questioned Coumadin, changed  toPradaxa   Hypertension    Hypothyroidism    Lower extremity edema 01/22/2018   Mitral regurgitation 05/03/2014   Mild-to-moderate mitral regurgitation, echo, November, 2015    Osteoarthritis of left knee 10/25/2017   Paroxysmal atrial fibrillation (HCC)    Episodes rapid atrial fib noted by pacemaker interrogation, August, 2011, diltiazem  added, patient improved. Patient continues on Rythmol   //   Changed to flecainide  2013  //   flecainide  level checked in 2013, good level    Persistent atrial fibrillation (HCC)    Presence of permanent cardiac pacemaker    Past Surgical History:  Procedure Laterality Date   ABDOMINAL HYSTERECTOMY     EP IMPLANTABLE DEVICE N/A 08/08/2015   Generator change with SJM Assurity DR PPM by Dr Kelsie   FEMUR IM NAIL Right 11/07/2023   Procedure: INSERTION, INTRAMEDULLARY ROD, FEMUR, RETROGRADE;  Surgeon: Genelle Standing, MD;  Location: [facility] OR;  Service: Orthopedics;  Laterality: Right;   JOINT REPLACEMENT     PACEMAKER PLACEMENT  2009             Review of Systems  Constitutional:  Negative for appetite change, fatigue and fever.  HENT:  Positive for hearing loss and trouble swallowing. Negative for congestion.   Eyes:  Negative for visual disturbance.  Respiratory:  Negative for cough, chest tightness and shortness of breath.   Cardiovascular:  Positive for leg swelling. Negative for chest pain and palpitations.  Gastrointestinal:  Negative for abdominal pain and constipation.  Genitourinary:  Negative for dysuria, frequency, hematuria and urgency.  Musculoskeletal:  Positive for arthralgias and gait problem.       Knees, L>R, intermittent in nature.  Left shoulder pain with ROM  Skin:  Positive for wound.       Chronic venous insufficiency skin changes: mild erythema in mid of R+L Right heel pressure ulcer wound   Neurological:  Positive for numbness. Negative for tremors, speech difficulty, weakness and headaches.       Memory lapses. chronic  numbness in fingers.   Psychiatric/Behavioral:  Negative for confusion and sleep disturbance. The patient is not nervous/anxious.        May be less smiling than usual upon my examination today?    Immunization History  Administered Date(s) Administered   Fluad Quad(high Dose 65+) 04/02/2022   Fluzone Influenza virus vaccine,trivalent (IIV3), split virus 02/24/2019, 03/08/2020, 03/25/2021   INFLUENZA,  HIGH DOSE SEASONAL PF 03/08/2015, 04/03/2017, 03/17/2018, 03/08/2020, 04/15/2023   Influenza Split 03/11/2009, 03/11/2010, 02/25/2011   Influenza,inj,Quad PF,6+ Mos 03/08/2016   Influenza,inj,quad, With Preservative 03/11/2017   Moderna Covid-19 Vaccine  Bivalent Booster 17yrs & up 10/27/2021, 07/11/2023   Moderna SARS-COV2 Booster Vaccination 11/08/2020   Moderna Sars-Covid-2 Vaccination 06/15/2019, 07/13/2019, 04/19/2020, 02/27/2022   PNEUMOCOCCAL CONJUGATE-20 06/27/2023   Pfizer Covid-19 Vaccine Bivalent Booster 62yrs & up 02/28/2021, 04/12/2022   Pneumococcal Conjugate-13 12/06/2014   Pneumococcal Polysaccharide-23 05/13/2001, 03/11/2009   Polio, Unspecified 02/02/1958   Tdap 11/21/2011, 04/11/2023   Zoster Recombinant(Shingrix) 07/18/2022, 10/19/2022   Pertinent  Health Maintenance Due  Topic Date Due   Influenza Vaccine  03/11/2024 (Originally 01/10/2024)   DEXA SCAN  Completed      02/16/2022    9:45 AM 02/27/2022   11:41 AM 07/06/2022   10:04 AM 07/13/2022    3:55 PM 03/25/2023    4:51 PM  Fall Risk  Falls in the past year?   0 0 1  Was there an injury with Fall?   0 0 0  Fall Risk Category Calculator   0 0 1  (RETIRED) Patient Fall Risk Level High fall risk  High fall risk      Patient at Risk for Falls Due to  History of fall(s) No Fall Risks No Fall Risks History of fall(s);Impaired balance/gait;Impaired mobility  Fall risk Follow up  Falls evaluation completed   Falls evaluation completed Falls evaluation completed;Falls prevention discussed;Education provided     Data  saved with a previous flowsheet row definition   Functional Status Survey:    Vitals:   03/04/24 1236  BP: 130/76  Pulse: 77  Resp: 18  Temp: (!) 97.1 F (36.2 C)  SpO2: 94%  Weight: 185 lb 4.8 oz (84.1 kg)   Body mass index is 32.82 kg/m. Physical Exam Vitals and nursing note reviewed.  Constitutional:      Appearance: Normal appearance.  HENT:     Head: Normocephalic and atraumatic.     Nose: Nose normal.     Mouth/Throat:     Mouth: Mucous membranes are moist.  Eyes:     Extraocular Movements: Extraocular movements intact.     Conjunctiva/sclera: Conjunctivae normal.     Pupils: Pupils are equal, round, and reactive to light.  Cardiovascular:     Rate and Rhythm: Normal rate. Rhythm irregular.     Heart sounds: Murmur heard.     Comments: No dorsalis pedis pulses felt from previous examination.  Pulmonary:     Effort: Pulmonary effort is normal.     Breath sounds: No rales.  Abdominal:     General: Bowel sounds are normal.     Palpations: Abdomen is soft.     Tenderness: There is no abdominal tenderness.  Genitourinary:    Comments: External hemorrhoids from previous examination Musculoskeletal:        General: Tenderness present. Normal range of motion.     Cervical back: Normal range of motion and neck supple.     Right lower leg: Edema present.     Left lower leg: Edema present.     Comments: Trace edema LLE, 1+ edema RLE Left shoulder pain with ROM R hip pain  Skin:    General: Skin is warm and dry.     Findings: No erythema.     Comments: Mild dark erythema mid of R+L lower legs, chronic.   R hip surgical scar Right heel pressure ulcer wound, healing stage II, no signs  symptoms of infection   Neurological:     General: No focal deficit present.     Mental Status: She is alert and oriented to person, place, and time. Mental status is at baseline.     Gait: Gait abnormal.     Comments: 5/5 muscle strength in fingers.   Psychiatric:        Mood and  Affect: Mood normal.        Behavior: Behavior normal.     Comments: Seems less smile than usual upon my examination today.     Labs reviewed: Recent Labs    11/10/23 1820 11/11/23 0505 11/12/23 0608 11/19/23 0000 12/10/23 0000  NA 141 139 138 136* 138  K 3.5 3.4* 4.9 4.0  --   CL 99 101 102 102 102  CO2 30 31 31  28* 26*  GLUCOSE 123* 121* 105*  --   --   BUN 26* 27* 20 18 16   CREATININE 0.84 0.68 0.70 0.8 0.8  CALCIUM  8.4* 8.0* 8.1* 8.3* 8.9  MG 2.1 2.1 2.2  --   --   PHOS 2.0* 2.6 2.4*  --   --    Recent Labs    11/10/23 1820 11/11/23 0505 11/12/23 0608 11/19/23 0000 01/07/24 0000  AST 31 30 32 16 25  ALT 17 16 18 9 14   ALKPHOS 39 36* 38 92 102  BILITOT 0.8 1.3* 1.7*  --   --   PROT 5.5* 5.1* 5.2*  --   --   ALBUMIN 2.7* 2.6* 2.6* 3.1* 3.7   Recent Labs    11/11/23 0505 11/12/23 0608 11/19/23 0000 11/22/23 1039 12/10/23 0000  WBC 6.9 6.8 6.3 7.3 5.4  NEUTROABS 4.1 3.9  --   --  3,289.00  HGB 7.9* 7.6* 10.1* 11.4 12.8  HCT 24.7* 23.8* 32* 36.2 41  MCV 105.6* 105.3*  --  105*  --   PLT 126* 142* 202 240 143*   Lab Results  Component Value Date   TSH 12.82 (A) 02/18/2024   Lab Results  Component Value Date   HGBA1C 4.8 11/08/2023   Lab Results  Component Value Date   CHOL 159 09/14/2023   HDL 42 09/14/2023   LDLCALC 100 (H) 09/14/2023   TRIG 85 09/14/2023   CHOLHDL 3.8 09/14/2023    Significant Diagnostic Results in last 30 days:  No results found.  Assessment/Plan: Recurrent depression the patient's daughter reported that the patient is feeling down at times, desire to try antidepressant, scored 2 on recent PHQ-9. Will try Lexapro 5 mg every day, follow-up BMP 1 week  IDA (iron  deficiency anemia) post op, on Iron , Hgb 12.8 12/10/23  Major neurocognitive disorder (HCC)  tolerated Namenda  well, helped with her mood as well. MMSE 27/30 04/09/23, CT head 02/12/22 Mild age-related atrophy and chronic microvascular  ischemic changes.  Persistent atrial fibrillation (HCC) Heart rate is in control, off Metoprolol , on Amiodarone , followed by Cardiology, on  Eliquis .             AS moderate  Hypertension Blood pressures are controlled, off Metoprolol  05/27/23 2/2 low Bp, on Furosemide ,  f/u Cardiology. Bun/creat 16/0.8 12/10/23  Congestive heart failure (HCC) Pacemaker, SSS,  f/u Cardiology             CHF, AS, edema BLE, on Furosemide . EF 70-75% 09/19/21, 03/27/22 Venous US , negative DVT. 03/30/22 arterial US  mild to moderate obstructive disease   CKD (chronic kidney disease) stage 3, GFR 30-59 ml/min (HCC) Bun/creat 16/0.8 12/10/23  Hypothyroidism  increased Levothyroxine  1150mcg/112mcg 02/14/24 TSH 4.206 11/07/23<<12.82 02/18/24, TSH 6 weeks.   GERD (gastroesophageal reflux disease)  stable, on Pantoprazole.     Family/ staff Communication: Plan of care reviewed with the patient and charge nurse  Labs/tests ordered: BMP 1 week

## 2024-03-04 NOTE — Assessment & Plan Note (Signed)
 Bun/creat 16/0.8 12/10/23

## 2024-03-04 NOTE — Assessment & Plan Note (Signed)
 increased Levothyroxine  1119mcg/112mcg 02/14/24 TSH 4.206 11/07/23<<12.82 02/18/24, TSH 6 weeks.

## 2024-03-04 NOTE — Assessment & Plan Note (Signed)
 the patient's daughter reported that the patient is feeling down at times, desire to try antidepressant, scored 2 on recent PHQ-9. Will try Lexapro 5 mg every day, follow-up BMP 1 week

## 2024-03-04 NOTE — Assessment & Plan Note (Signed)
 tolerated Namenda well, helped with her mood as well. MMSE 27/30 04/09/23, CT head 02/12/22 Mild age-related atrophy and chronic microvascular ischemic changes.

## 2024-03-04 NOTE — Assessment & Plan Note (Signed)
 Blood pressures are controlled, off Metoprolol  05/27/23 2/2 low Bp, on Furosemide ,  f/u Cardiology. Bun/creat 16/0.8 12/10/23

## 2024-03-06 ENCOUNTER — Ambulatory Visit: Attending: Internal Medicine | Admitting: Internal Medicine

## 2024-03-06 VITALS — BP 120/56 | HR 63 | Ht 63.0 in | Wt 185.0 lb

## 2024-03-06 DIAGNOSIS — I480 Paroxysmal atrial fibrillation: Secondary | ICD-10-CM | POA: Diagnosis not present

## 2024-03-06 DIAGNOSIS — I495 Sick sinus syndrome: Secondary | ICD-10-CM | POA: Diagnosis not present

## 2024-03-06 DIAGNOSIS — I350 Nonrheumatic aortic (valve) stenosis: Secondary | ICD-10-CM | POA: Insufficient documentation

## 2024-03-06 DIAGNOSIS — R0602 Shortness of breath: Secondary | ICD-10-CM | POA: Diagnosis not present

## 2024-03-06 DIAGNOSIS — I10 Essential (primary) hypertension: Secondary | ICD-10-CM | POA: Insufficient documentation

## 2024-03-06 DIAGNOSIS — I5032 Chronic diastolic (congestive) heart failure: Secondary | ICD-10-CM | POA: Insufficient documentation

## 2024-03-06 DIAGNOSIS — Z950 Presence of cardiac pacemaker: Secondary | ICD-10-CM | POA: Diagnosis not present

## 2024-03-06 DIAGNOSIS — D6869 Other thrombophilia: Secondary | ICD-10-CM | POA: Diagnosis not present

## 2024-03-06 NOTE — Progress Notes (Signed)
 Cardiology Office Note:  .   Date:  03/06/2024  ID:  [name], DOB [birth year], MRN [id number] PCP: [name], MD  [facility] Providers Cardiologist:  Soyla DELENA Merck, MD Electrophysiologist:  OLE ONEIDA HOLTS, MD    History of Present Illness: .   [name] is a 89 y.o. female.  Discussed the use of AI scribe software for clinical note transcription with the patient, who gave verbal consent to proceed.  History of Present Illness [name] is a 89 year old female with atrial fibrillation, a pacemaker, hypertension, hyperlipidemia, and aortic valve stenosis who presents for a routine cardiovascular checkup. She is accompanied by her daughter.  She has atrial fibrillation and is on Eliquis  for stroke prevention, with no recent episodes and a low burden of AFib. She is also on amiodarone , with no changes in her medication regimen since her last visit. Her pacemaker was placed due to bradycardia, and she experiences no chest pain or dyspnea. Her aortic valve stenosis was moderate on the last echocardiogram in April, with no related symptoms. She has hypertension and hyperlipidemia, currently managed with Lasix  20 mg daily, and is not on cholesterol medication.    ROS: negative except per HPI above.  Studies Reviewed: SABRA   EKG Interpretation Date/Time:  Friday March 06 2024 11:02:17 EDT Ventricular Rate:  63 PR Interval:  318 QRS Duration:  98 QT Interval:  466 QTC Calculation: 476 R Axis:   -11  Text Interpretation: Atrial-paced rhythm with prolonged AV conduction Left ventricular hypertrophy ( R in aVL , Sokolow-Lyon , Cornell product ) Marked ST abnormality, possible inferior subendocardial injury Marked ST abnormality, possible anterolateral subendocardial injury Confirmed by Merck Soyla ([id number]) on 03/06/2024 12:02:08 PM    Results LABS Blood counts: Normalized (11/2023) LDL: 100 mg/dL Platelet count: Slightly low  DIAGNOSTIC EKG:  Normal Echocardiogram: Moderate aortic valve stenosis (09/2023) Risk Assessment/Calculations:    CHA2DS2-VASc Score = 5   This indicates a 7.2% annual risk of stroke. The patient's score is based upon: CHF History: 0 HTN History: 1 Diabetes History: 0 Stroke History: 0 Vascular Disease History: 1 Age Score: 2 Gender Score: 1      Physical Exam:   VS:  BP (!) 120/56 (BP Location: Left Arm, Patient Position: Sitting, Cuff Size: Normal)   Pulse 63   Ht 5' 3 (1.6 m)   Wt 185 lb (83.9 kg)   SpO2 95%   BMI 32.77 kg/m    Wt Readings from Last 3 Encounters:  03/06/24 185 lb (83.9 kg)  03/04/24 185 lb 4.8 oz (84.1 kg)  02/18/24 185 lb 4.8 oz (84.1 kg)     Physical Exam GENERAL: Alert, cooperative, well developed, no acute distress. HEENT: Normocephalic, normal oropharynx, moist mucous membranes. CHEST: Clear to auscultation bilaterally, no wheezes, rhonchi, or crackles. CARDIOVASCULAR: Aortic valve murmur present, normal heart rate and rhythm, S1 and S2 normal. ABDOMEN: Soft, non-tender, non-distended, without organomegaly, normal bowel sounds. EXTREMITIES: No cyanosis or edema. NEUROLOGICAL: Cranial nerves grossly intact, moves all extremities without gross motor or sensory deficit.   ASSESSMENT AND PLAN: .    Assessment and Plan Assessment & Plan Aortic valve stenosis, moderate, under surveillance Moderate aortic valve stenosis, likely progressive. At 63, not a candidate for open heart surgery. TAVR considered if symptoms develop. Currently asymptomatic. - Repeat echocardiograms every 6 months to 1 year. - Consider TAVR if symptoms develop.  Atrial fibrillation on anticoagulation and antiarrhythmic therapy, with pacemaker in situ Atrial fibrillation  with low burden, managed with Eliquis  and amiodarone . Pacemaker functioning appropriately. Blood counts normalized. - Continue Eliquis  5 mg BID. - Continue amiodarone  200 mg daily.  Essential hypertension - cont lasix  20  mg daily  Hyperlipidemia Cholesterol levels acceptable for age, LDL at 100.      Soyla Merck, MD, FACC

## 2024-03-06 NOTE — Patient Instructions (Signed)
 Medication Instructions:  Your physician recommends that you continue on your current medications as directed. Please refer to the Current Medication list given to you today.  *If you need a refill on your cardiac medications before your next appointment, please call your pharmacy*  Lab Work: None ordered  If you have labs (blood work) drawn today and your tests are completely normal, you will receive your results only by: MyChart Message (if you have MyChart) OR A paper copy in the mail If you have any lab test that is abnormal or we need to change your treatment, we will call you to review the results.  Testing/Procedures: Your physician has requested that you have an echocardiogram IN [facility] OF 2026. Echocardiography is a painless test that uses sound waves to create images of your heart. It provides your doctor with information about the size and shape of your heart and how well your heart's chambers and valves are working. This procedure takes approximately one hour. There are no restrictions for this procedure. Please do NOT wear cologne, perfume, aftershave, or lotions (deodorant is allowed). Please arrive 15 minutes prior to your appointment time.  Please note: We ask at that you not bring children with you during ultrasound (echo/ vascular) testing. Due to room size and safety concerns, children are not allowed in the ultrasound rooms during exams. Our front office staff cannot provide observation of children in our lobby area while testing is being conducted. An adult accompanying a patient to their appointment will only be allowed in the ultrasound room at the discretion of the ultrasound technician under special circumstances. We apologize for any inconvenience.   Follow-Up: At [facility], you and your health needs are our priority.  As part of our continuing mission to provide you with exceptional heart care, our providers are all part of one team.  This team includes  your primary Cardiologist (physician) and Advanced Practice Providers or APPs (Physician Assistants and Nurse Practitioners) who all work together to provide you with the care you need, when you need it.  Your next appointment:   12 month(s)  Provider:   Gayatri A Acharya, MD    We recommend signing up for the patient portal called MyChart.  Sign up information is provided on this After Visit Summary.  MyChart is used to connect with patients for Virtual Visits (Telemedicine).  Patients are able to view lab/test results, encounter notes, upcoming appointments, etc.  Non-urgent messages can be sent to your provider as well.   To learn more about what you can do with MyChart, go to [email or web address].   Other Instructions

## 2024-03-09 ENCOUNTER — Encounter: Payer: Self-pay | Admitting: Sports Medicine

## 2024-03-09 ENCOUNTER — Non-Acute Institutional Stay (SKILLED_NURSING_FACILITY): Payer: Self-pay | Admitting: Sports Medicine

## 2024-03-09 DIAGNOSIS — I509 Heart failure, unspecified: Secondary | ICD-10-CM

## 2024-03-09 DIAGNOSIS — R1010 Upper abdominal pain, unspecified: Secondary | ICD-10-CM

## 2024-03-09 DIAGNOSIS — F0390 Unspecified dementia without behavioral disturbance: Secondary | ICD-10-CM | POA: Diagnosis not present

## 2024-03-09 DIAGNOSIS — R300 Dysuria: Secondary | ICD-10-CM | POA: Diagnosis not present

## 2024-03-09 DIAGNOSIS — I480 Paroxysmal atrial fibrillation: Secondary | ICD-10-CM | POA: Diagnosis not present

## 2024-03-09 NOTE — Progress Notes (Signed)
 Provider:  Dr. Jackalyn Blazing Location:  [facility] Place of Service:   Skilled care   PCP: Blazing Jackalyn, MD Patient Care Team: Blazing Jackalyn, MD as PCP - General (Internal Medicine) [name], MD as PCP - Electrophysiology (Cardiology) Loni Soyla LABOR, MD as PCP - Cardiology (Cardiology) [name], FNP [facility] and Palliative Medicine)  Extended Emergency Contact Information Primary Emergency Contact: [facility] Phone: [phone] Mobile Phone: [phone] Relation: Daughter Interpreter needed? No Secondary Emergency Contact: Pearman,Karen Address: [address]          [place], [place] [address] [place] Home Phone: [phone] Mobile Phone: [phone] Relation: Daughter  Goals of Care: Advanced Directive information    12/09/2023   10:55 AM  Advanced Directives  Does Patient Have a Medical Advance Directive? Yes  Type of Estate agent of [place];Out of facility DNR (pink MOST or yellow form)  Does patient want to make changes to medical advance directive? No - Patient declined  Copy of Healthcare Power of Attorney in Chart? Yes - validated most recent copy scanned in chart (See row information)       History of Present Illness  89 year old female with a past medical history of CHF, arctic stenosis, dementia, A-fib, hypothyroidism is seen for an acute visit for nausea and vomiting. Patient seen and examined in her room.  Daughter at bedside.  Patient seems pleasant and comfortable and sitting in her recliner chair. Patient complains of feeling nauseous and had 1 episode of vomiting yesterday. She complains of lower abdominal pain.  She was recently treated for UTI and completed antibiotics Patient complains of intermittent dysuria.  Denies hematuria, lower back pain. Patient has a history of dementia and is a poor historian.  No agitation per nursing staff.  She is  currently on Namenda . History of CHF/aortic stenosis Patient has exertional shortness of breath.  She sleeps in a recliner chair.  She is able to speak in full sentences and does not appear to be in distress. She recently followed with cardiology.  Currently on amiodarone , Lasix ..      Past Medical History:  Diagnosis Date   Aortic stenosis 03/08/2016   Brady-tachy syndrome (HCC)    a. Biotronik dual chamber PPM implanted 2009 b. gen change to STJ dual chamber PPM 2017   Ejection fraction    EF 70%, echo, October, 2009  //   EF 55-60%, septal dyssynergy consistent with a paced rhythm, mild to moderate mitral regurgitation, echo, November, 2015    GERD (gastroesophageal reflux disease) 04/28/2014   Episodes November, 2015 with fluid refluxing from her esophagus.    Hair loss    Patient questioned Coumadin, changed toPradaxa   Hypertension    Hypothyroidism    Lower extremity edema 01/22/2018   Mitral regurgitation 05/03/2014   Mild-to-moderate mitral regurgitation, echo, November, 2015    Osteoarthritis of left knee 10/25/2017   Paroxysmal atrial fibrillation (HCC)    Episodes rapid atrial fib noted by pacemaker interrogation, August, 2011, diltiazem  added, patient improved. Patient continues on Rythmol   //   Changed to flecainide  2013  //   flecainide  level checked in 2013, good level    Persistent atrial fibrillation (HCC)    Presence of permanent cardiac pacemaker    Past Surgical History:  Procedure Laterality Date   ABDOMINAL HYSTERECTOMY     EP IMPLANTABLE DEVICE N/A 08/08/2015   Generator change with SJM Assurity DR PPM by Dr Kelsie   FEMUR IM NAIL Right  11/07/2023   Procedure: INSERTION, INTRAMEDULLARY ROD, FEMUR, RETROGRADE;  Surgeon: Genelle Standing, MD;  Location: [facility] OR;  Service: Orthopedics;  Laterality: Right;   JOINT REPLACEMENT     PACEMAKER PLACEMENT  2009        reports that she quit smoking about 33 years ago. Her smoking use included cigarettes. She has  never used smokeless tobacco. She reports that she does not drink alcohol  and does not use drugs. Social History   Socioeconomic History   Marital status: Widowed    Spouse name: Not on file   Number of children: Not on file   Years of education: Not on file   Highest education level: Not on file  Occupational History   Not on file  Tobacco Use   Smoking status: Former    Current packs/day: 0.00    Types: Cigarettes    Quit date: 09/06/1990    Years since quitting: 33.5   Smokeless tobacco: Never  Vaping Use   Vaping status: Never Used  Substance and Sexual Activity   Alcohol  use: No   Drug use: No   Sexual activity: Not on file  Other Topics Concern   Not on file  Social History Narrative   Not on file   Social Drivers of Health   Financial Resource Strain: Not on file  Food Insecurity: No Food Insecurity (11/06/2023)   Hunger Vital Sign    Worried About Running Out of Food in the Last Year: Never true    Ran Out of Food in the Last Year: Never true  Transportation Needs: No Transportation Needs (11/06/2023)   PRAPARE - Administrator, Civil Service (Medical): No    Lack of Transportation (Non-Medical): No  Physical Activity: Not on file  Stress: Not on file  Social Connections: Moderately Integrated (11/06/2023)   Social Connection and Isolation Panel    Frequency of Communication with Friends and Family: Twice a week    Frequency of Social Gatherings with Friends and Family: Three times a week    Attends Religious Services: 1 to 4 times per year    Active Member of Clubs or Organizations: No    Attends Engineer, structural: More than 4 times per year    Marital Status: Widowed  Intimate Partner Violence: Not At Risk (11/06/2023)   Humiliation, Afraid, Rape, and Kick questionnaire    Fear of Current or Ex-Partner: No    Emotionally Abused: No    Physically Abused: No    Sexually Abused: No    Functional Status Survey:    Family History   Problem Relation Age of Onset   Heart disease Father    Coronary artery disease Other    Heart disease Son     Health Maintenance  Topic Date Due   Medicare Annual Wellness (AWV)  04/08/2024   Influenza Vaccine  03/11/2024 (Originally 01/10/2024)   COVID-19 Vaccine (3 - Pfizer risk series) 03/11/2024 (Originally 08/08/2023)   DTaP/Tdap/Td (3 - Td or Tdap) 04/10/2033   Pneumococcal Vaccine: 50+ Years  Completed   DEXA SCAN  Completed   Zoster Vaccines- Shingrix  Completed   HPV VACCINES  Aged Out   Meningococcal B Vaccine  Aged Out    Allergies  Allergen Reactions   Morphine And Codeine Other (See Comments)    Patient states that she went crazy with this   Penicillins Swelling and Rash       Aspirin      Pacemaker   Clindamycin/Lincomycin Other (  See Comments)    Any meds with mycin in the name Unknown reaction Not listed on the [facility]   Crestor  [Rosuvastatin ]     Other reaction(s): muscle pain Not listed on the [facility]   Diltiazem      Skin discoloration, lower extremity swelling Not listed on the [facility]   Erythromycin Other (See Comments)    Any meds with mycin in the name Unknown reaction Not listed on the [facility]   Lisinopril Other (See Comments)    REACTION: Cough   Metronidazole     Other reaction(s): mouth sores, tongue swelling   Morphine    Penicillins Cross Reactors Other (See Comments)    Any meds with mycin in the name Unknown reaction    Outpatient Encounter Medications as of 03/09/2024  Medication Sig   acetaminophen  (TYLENOL ) 325 MG tablet Take 650 mg by mouth every 4 (four) hours as needed for mild pain (pain score 1-3) (or fever).   albuterol  (PROVENTIL ) (2.5 MG/3ML) 0.083% nebulizer solution Take 3 mLs (2.5 mg total) by nebulization every 6 (six) hours as needed for wheezing or shortness of breath.   amiodarone  (PACERONE ) 200 MG tablet Take 200 mg by mouth daily.   apixaban  (ELIQUIS ) 5 MG TABS tablet Take 1 tablet (5 mg total) by mouth 2 (two) times daily.    Apoaequorin (PREVAGEN) 10 MG CAPS Take 10 mg by mouth daily.   betamethasone , augmented, (DIPROLENE ) 0.05 % gel Apply 1 application  topically every 12 (twelve) hours as needed.   bisacodyl  (DULCOLAX) 10 MG suppository Place 1 suppository (10 mg total) rectally daily as needed for moderate constipation.   brimonidine -timolol  (COMBIGAN ) 0.2-0.5 % ophthalmic solution Place 1 drop into the right eye every 12 (twelve) hours.   Calcium  Carbonate-Vit D-Min (CALCIUM  600+D PLUS MINERALS) 600-400 MG-UNIT TABS Take 1 tablet by mouth daily.   diclofenac  Sodium (VOLTAREN ) 1 % GEL Apply 1 g topically 2 (two) times daily as needed (to L foot bunion deformity for pain).   escitalopram (LEXAPRO) 5 MG tablet Take 5 mg by mouth daily.   furosemide  (LASIX ) 20 MG tablet TAKE ONE TABLET BY MOUTH EVERY DAY   Ipratropium-Albuterol  (COMBIVENT) 20-100 MCG/ACT AERS respimat Inhale 1 puff into the lungs every 6 (six) hours as needed for shortness of breath.   Iron , Ferrous Sulfate , 325 (65 Fe) MG TABS Take 325 mg by mouth daily.   lactose free nutrition (BOOST) LIQD Take 237 mLs by mouth 2 (two) times daily between meals.   levothyroxine  (SYNTHROID ) 137 MCG tablet Take 137 mcg by mouth daily before breakfast.   lidocaine  (HM LIDOCAINE  PATCH) 4 % Place 1 patch onto the skin in the morning. Apply topically to L shoulder in the morning. Apply for 12 hours (0900) then removed for 12 hours (2100).   melatonin 5 MG TABS Take 1 tablet (5 mg total) by mouth at bedtime.   Multiple Vitamin (MULTIVITAMIN ADULT) TABS Take 1 tablet by mouth in the morning.   Multiple Vitamins-Minerals (OCUVITE PO) Take 1 capsule by mouth in the morning.   NAMENDA  10 MG tablet TAKE ONE TABLET BY MOUTH TWICE A DAY FOR COGNITIVE DECLINE   nystatin cream (MYCOSTATIN) Apply 1 Application topically every 12 (twelve) hours as needed (for rash).   ondansetron  (ZOFRAN ) 4 MG tablet Take 1 tablet (4 mg total) by mouth every 6 (six) hours as needed for nausea.    oxyCODONE  (ROXICODONE ) 5 MG immediate release tablet Take 1 tablet (5 mg total) by mouth every 6 (six) hours as  needed for severe pain (pain score 7-10).   pantoprazole (PROTONIX) 40 MG tablet Take 40 mg by mouth daily as needed (for indigestion).   Polyethyl Glyc-Propyl Glyc PF (SYSTANE HYDRATION PF) 0.4-0.3 % SOLN Place 1 drop into the right eye in the morning, at noon, in the evening, and at bedtime.   polyethylene glycol (MIRALAX  / GLYCOLAX ) 17 g packet Take 17 g by mouth every other day.   potassium chloride  (KLOR-CON  M) 10 MEQ tablet Take 1 tablet (10 mEq total) by mouth daily.   senna-docusate (SENOKOT-S) 8.6-50 MG tablet Take 1 tablet by mouth at bedtime as needed for mild constipation.   sennosides-docusate sodium  (SENOKOT-S) 8.6-50 MG tablet Take 1 tablet by mouth daily.   No facility-administered encounter medications on file as of 03/09/2024.    Review of Systems  Constitutional:  Negative for fever.  HENT:  Negative for sore throat.   Respiratory:  Positive for shortness of breath (exertional). Negative for cough and wheezing.   Cardiovascular:  Negative for chest pain.  Gastrointestinal:  Positive for abdominal pain, diarrhea, nausea and vomiting. Negative for blood in stool and constipation.  Genitourinary:  Positive for dysuria.  Neurological:  Negative for dizziness.   Negative unless indicated in HPI.  There were no vitals filed for this visit. There is no height or weight on file to calculate BMI. BP Readings from Last 3 Encounters:  03/06/24 (!) 120/56  03/04/24 130/76  02/18/24 (!) 145/65   Wt Readings from Last 3 Encounters:  03/06/24 185 lb (83.9 kg)  03/04/24 185 lb 4.8 oz (84.1 kg)  02/18/24 185 lb 4.8 oz (84.1 kg)   Physical Exam Constitutional:      Appearance: Normal appearance.  HENT:     Head: Normocephalic and atraumatic.  Cardiovascular:     Rate and Rhythm: Normal rate and regular rhythm.     Heart sounds: Murmur heard.  Pulmonary:      Effort: Pulmonary effort is normal. No respiratory distress.     Breath sounds: Normal breath sounds. No wheezing.  Abdominal:     General: Bowel sounds are normal. There is no distension.     Tenderness: There is no abdominal tenderness. There is no guarding or rebound.     Comments:    Musculoskeletal:        General: No swelling.  Neurological:     Mental Status: She is alert. Mental status is at baseline.     Motor: No weakness.     Labs reviewed: Basic Metabolic Panel: Recent Labs    11/10/23 1820 11/11/23 0505 11/12/23 0608 11/19/23 0000 12/10/23 0000  NA 141 139 138 136* 138  K 3.5 3.4* 4.9 4.0  --   CL 99 101 102 102 102  CO2 30 31 31  28* 26*  GLUCOSE 123* 121* 105*  --   --   BUN 26* 27* 20 18 16   CREATININE 0.84 0.68 0.70 0.8 0.8  CALCIUM  8.4* 8.0* 8.1* 8.3* 8.9  MG 2.1 2.1 2.2  --   --   PHOS 2.0* 2.6 2.4*  --   --    Liver Function Tests: Recent Labs    11/10/23 1820 11/11/23 0505 11/12/23 0608 11/19/23 0000 01/07/24 0000  AST 31 30 32 16 25  ALT 17 16 18 9 14   ALKPHOS 39 36* 38 92 102  BILITOT 0.8 1.3* 1.7*  --   --   PROT 5.5* 5.1* 5.2*  --   --   ALBUMIN 2.7* 2.6* 2.6*  3.1* 3.7   No results for input(s): LIPASE, AMYLASE in the last 8760 hours. No results for input(s): AMMONIA in the last 8760 hours. CBC: Recent Labs    11/11/23 0505 11/12/23 0608 11/19/23 0000 11/22/23 1039 12/10/23 0000  WBC 6.9 6.8 6.3 7.3 5.4  NEUTROABS 4.1 3.9  --   --  3,289.00  HGB 7.9* 7.6* 10.1* 11.4 12.8  HCT 24.7* 23.8* 32* 36.2 41  MCV 105.6* 105.3*  --  105*  --   PLT 126* 142* 202 240 143*   Cardiac Enzymes: No results for input(s): CKTOTAL, CKMB, CKMBINDEX, TROPONINI in the last 8760 hours. BNP: Invalid input(s): POCBNP Lab Results  Component Value Date   HGBA1C 4.8 11/08/2023   Lab Results  Component Value Date   TSH 12.82 (A) 02/18/2024   Lab Results  Component Value Date   VITAMINB12 340 11/10/2023   Lab Results   Component Value Date   FOLATE 24.2 11/10/2023   Lab Results  Component Value Date   IRON  28 11/10/2023   TIBC 330 11/10/2023   FERRITIN 68 11/10/2023    Imaging and Procedures obtained prior to SNF admission: DG FEMUR, MIN 2 VIEWS RIGHT Result Date: 11/07/2023 CLINICAL DATA:  Elective surgery. EXAM: RIGHT FEMUR 2 VIEWS COMPARISON:  Preoperative imaging FINDINGS: Nine fluoroscopic spot views of the right femur submitted from the operating room. Femoral intramedullary nail with trans trochanteric and distal locking screw fixation of proximal femur fracture. Fluoroscopy time 176.7 seconds. Dose 71.17 mGy. IMPRESSION: Intraoperative fluoroscopy during proximal femur fracture fixation. Electronically Signed   By: Andrea Gasman M.D.   On: 11/07/2023 20:22   DG C-Arm 1-60 Min-No Report Result Date: 11/07/2023 Fluoroscopy was utilized by the requesting physician.  No radiographic interpretation.   DG C-Arm 1-60 Min-No Report Result Date: 11/07/2023 Fluoroscopy was utilized by the requesting physician.  No radiographic interpretation.   CT HEAD WO CONTRAST Result Date: 11/06/2023 CLINICAL DATA:  Head trauma, mechanical fall. EXAM: CT HEAD WITHOUT CONTRAST CT CERVICAL SPINE WITHOUT CONTRAST TECHNIQUE: Multidetector CT imaging of the head and cervical spine was performed following the standard protocol without intravenous contrast. Multiplanar CT image reconstructions of the cervical spine were also generated. RADIATION DOSE REDUCTION: This exam was performed according to the departmental dose-optimization program which includes automated exposure control, adjustment of the mA and/or kV according to patient size and/or use of iterative reconstruction technique. COMPARISON:  CT head 09/13/2023, CT cervical spine 09/25/2020. FINDINGS: CT HEAD FINDINGS Brain: No acute intracranial hemorrhage. No CT evidence of acute infarct. Nonspecific hypoattenuation in the periventricular and subcortical white matter  favored to reflect chronic microvascular ischemic changes. Generalized parenchymal volume loss. No edema, mass effect, or midline shift. The basilar cisterns are patent. Ventricles: The ventricles are normal. Vascular: Atherosclerotic calcifications of the carotid siphons. No hyperdense vessel. Skull: No acute or aggressive finding. Orbits: Orbits are symmetric. Sinuses: Complete opacification of the left maxillary sinus. Mild mucosal thickening in the ethmoid sinuses and right sphenoid sinus. Other: Mastoid air cells are clear. CT CERVICAL SPINE FINDINGS Alignment: Straightening of the normal cervical lordosis. No significant listhesis. No facet subluxation or dislocation. Skull base and vertebrae: No acute fracture. No primary bone lesion or focal pathologic process. Soft tissues and spinal canal: No prevertebral fluid or swelling. No visible canal hematoma. Disc levels: Intervertebral disc space narrowing at multiple levels most pronounced at C4-5. Disc osteophyte complexes at multiple levels. Disc bulge and thickening of the ligamentum flavum at C3-4 resulting in moderate spinal canal  stenosis. Additional disc osteophyte complexes at C4-5 and C5-6 contributing to moderate spinal canal stenosis. Facet arthrosis and uncovertebral hypertrophy at multiple levels. Foraminal narrowing most pronounced on the left at C6-7. Upper chest: Negative. Other: None. IMPRESSION: No CT evidence of acute intracranial abnormality. No acute fracture or traumatic malalignment of the cervical spine. Degenerative changes as above. Moderate chronic microvascular ischemic changes and generalized parenchymal volume loss. Electronically Signed   By: Donnice Mania M.D.   On: 11/06/2023 18:25   CT CERVICAL SPINE WO CONTRAST Result Date: 11/06/2023 CLINICAL DATA:  Head trauma, mechanical fall. EXAM: CT HEAD WITHOUT CONTRAST CT CERVICAL SPINE WITHOUT CONTRAST TECHNIQUE: Multidetector CT imaging of the head and cervical spine was performed  following the standard protocol without intravenous contrast. Multiplanar CT image reconstructions of the cervical spine were also generated. RADIATION DOSE REDUCTION: This exam was performed according to the departmental dose-optimization program which includes automated exposure control, adjustment of the mA and/or kV according to patient size and/or use of iterative reconstruction technique. COMPARISON:  CT head 09/13/2023, CT cervical spine 09/25/2020. FINDINGS: CT HEAD FINDINGS Brain: No acute intracranial hemorrhage. No CT evidence of acute infarct. Nonspecific hypoattenuation in the periventricular and subcortical white matter favored to reflect chronic microvascular ischemic changes. Generalized parenchymal volume loss. No edema, mass effect, or midline shift. The basilar cisterns are patent. Ventricles: The ventricles are normal. Vascular: Atherosclerotic calcifications of the carotid siphons. No hyperdense vessel. Skull: No acute or aggressive finding. Orbits: Orbits are symmetric. Sinuses: Complete opacification of the left maxillary sinus. Mild mucosal thickening in the ethmoid sinuses and right sphenoid sinus. Other: Mastoid air cells are clear. CT CERVICAL SPINE FINDINGS Alignment: Straightening of the normal cervical lordosis. No significant listhesis. No facet subluxation or dislocation. Skull base and vertebrae: No acute fracture. No primary bone lesion or focal pathologic process. Soft tissues and spinal canal: No prevertebral fluid or swelling. No visible canal hematoma. Disc levels: Intervertebral disc space narrowing at multiple levels most pronounced at C4-5. Disc osteophyte complexes at multiple levels. Disc bulge and thickening of the ligamentum flavum at C3-4 resulting in moderate spinal canal stenosis. Additional disc osteophyte complexes at C4-5 and C5-6 contributing to moderate spinal canal stenosis. Facet arthrosis and uncovertebral hypertrophy at multiple levels. Foraminal narrowing  most pronounced on the left at C6-7. Upper chest: Negative. Other: None. IMPRESSION: No CT evidence of acute intracranial abnormality. No acute fracture or traumatic malalignment of the cervical spine. Degenerative changes as above. Moderate chronic microvascular ischemic changes and generalized parenchymal volume loss. Electronically Signed   By: Donnice Mania M.D.   On: 11/06/2023 18:25   CT PELVIS WO CONTRAST Result Date: 11/06/2023 CLINICAL DATA:  Hip trauma, fracture suspected, xray done Right hip pain. EXAM: CT OF THE PELVIS WITHOUT CONTRAST TECHNIQUE: Multidetector CT imaging of the pelvis was performed according to the standard protocol. Multiplanar CT image reconstructions were also generated. RADIATION DOSE REDUCTION: This exam was performed according to the departmental dose-optimization program which includes automated exposure control, adjustment of the mA and/or kV according to patient size and/or use of iterative reconstruction technique. COMPARISON:  Radiograph earlier today FINDINGS: Bones/Joint/Cartilage Comminuted and displaced proximal femur fracture. There is a displaced component involving the lesser trochanter. There is a transverse subtrochanteric component. Fracture extends to involve the base of the femoral neck. No hip dislocation, the femoral head remains seated. Intact pubic rami. No sacral fracture. Minimal right hip joint effusion. Ligaments Suboptimally assessed by CT. Muscles and Tendons Mild edema in the skill  a chair today shin to the fracture. No muscle atrophy. Soft tissues Colonic diverticulosis without diverticulitis. Stable 3.7 cm cystic structure in the left adnexa adjacent to the left aspect of the vaginal cuff from 2023 hip CT, likely benign given stability. There is a fat containing umbilical hernia. Aortic atherosclerosis. IMPRESSION: Comminuted and displaced proximal right femur fracture with a displaced component involving the lesser trochanter. There is a transverse  subtrochanteric component. Fracture extends to involve the base of the femoral neck. Aortic Atherosclerosis (ICD10-I70.0). Electronically Signed   By: Andrea Gasman M.D.   On: 11/06/2023 18:19   DG Hip Unilat With Pelvis 2-3 Views Right Result Date: 11/06/2023 CLINICAL DATA:  fall EXAM: DG HIP (WITH OR WITHOUT PELVIS) 2-3V RIGHT COMPARISON:  None Available. FINDINGS: Acute comminuted and markedly displaced right intertrochanteric femoral fracture. No right hip dislocation. Query slight irregularity of the left femoral neck. Poorly visualized left hip- limited evaluation due to overlapping osseous structures and overlying soft tissues. view. No acute displaced fracture or diastasis of the bones of the pelvis. No acute displaced fractured diastasis of the bones of the pelvis. There is no evidence of severe arthropathy or other focal bone abnormality. IMPRESSION: Acute comminuted and markedly displaced right intertrochanteric femoral fracture. Poorly visualized left hip- limited evaluation due to overlapping osseous structures and overlying soft tissues. Query slight irregularity of the left femoral neck. Recommend dedicated three view radiograph of the left hip for further evaluation. Electronically Signed   By: Morgane  Naveau M.D.   On: 11/06/2023 17:48   DG Chest Port 1 View Result Date: 11/06/2023 CLINICAL DATA:  fall EXAM: PORTABLE CHEST 1 VIEW COMPARISON:  Chest x-Ore 09/13/2023 FINDINGS: Left chest wall dual lead pacemaker. The heart and mediastinal contours are unchanged. Atherosclerotic plaque. Left base atelectasis. No focal consolidation. Chronic coarsened interstitial markings with no overt pulmonary edema. No pleural effusion. No pneumothorax. No acute osseous abnormality. IMPRESSION: 1. No active disease. 2.  Aortic Atherosclerosis (ICD10-I70.0). Electronically Signed   By: Morgane  Naveau M.D.   On: 11/06/2023 17:43    Assessment and Plan Assessment & Plan  1. Dysuria (Primary) Patient  complains of intermittent dysuria Recently completed antibiotics Will check UA, urine culture    2. Pain of upper abdomen Patient complains of upper abdominal pain She has a history of dementia and a poor historian Complains of feeling nausea Will start Zofran  as needed Will get labs CBC, CMP, lipase   3. Paroxysmal atrial fibrillation (HCC) No signs of bleeding Continue with amiodarone , apixaban   4. Chronic congestive heart failure, unspecified heart failure type (HCC) Lungs clear to auscultation Continue with the Lasix  Avoid high salt diet  5. Major neurocognitive disorder (HCC) No behavioral manifestations reported by nursing staff Continue with Namenda  Continue assistance with ADLs.

## 2024-03-09 NOTE — Progress Notes (Signed)
 Remote PPM Transmission

## 2024-03-10 DIAGNOSIS — E039 Hypothyroidism, unspecified: Secondary | ICD-10-CM | POA: Diagnosis not present

## 2024-03-10 DIAGNOSIS — I10 Essential (primary) hypertension: Secondary | ICD-10-CM | POA: Diagnosis not present

## 2024-03-10 LAB — CBC: RBC: 3.92 (ref 3.87–5.11)

## 2024-03-10 LAB — BASIC METABOLIC PANEL WITH GFR
BUN: 32 — AB (ref 4–21)
CO2: 31 — AB (ref 13–22)
Chloride: 102 (ref 99–108)
Creatinine: 0.9 (ref 0.5–1.1)
Glucose: 114
Potassium: 4 meq/L (ref 3.5–5.1)
Sodium: 139 (ref 137–147)

## 2024-03-10 LAB — CBC AND DIFFERENTIAL
HCT: 39 (ref 36–46)
Hemoglobin: 12.5 (ref 12.0–16.0)
Neutrophils Absolute: 6845
Platelets: 97 K/uL — AB (ref 150–400)
WBC: 9.3

## 2024-03-10 LAB — HEPATIC FUNCTION PANEL
ALT: 15 U/L (ref 7–35)
AST: 19 (ref 13–35)
Alkaline Phosphatase: 71 (ref 25–125)
Bilirubin, Total: 0.7

## 2024-03-10 LAB — COMPREHENSIVE METABOLIC PANEL WITH GFR
Albumin: 3.2 — AB (ref 3.5–5.0)
Globulin: 2.7
eGFR: 61

## 2024-03-12 DIAGNOSIS — I10 Essential (primary) hypertension: Secondary | ICD-10-CM | POA: Diagnosis not present

## 2024-03-12 LAB — BASIC METABOLIC PANEL WITH GFR
BUN: 22 — AB (ref 4–21)
CO2: 32 — AB (ref 13–22)
Chloride: 102 (ref 99–108)
Creatinine: 0.7 (ref 0.5–1.1)
Glucose: 98
Potassium: 4.2 meq/L (ref 3.5–5.1)
Sodium: 140 (ref 137–147)

## 2024-03-12 LAB — COMPREHENSIVE METABOLIC PANEL WITH GFR
Calcium: 8.8 (ref 8.7–10.7)
eGFR: 80

## 2024-03-13 ENCOUNTER — Encounter: Payer: Self-pay | Admitting: Nurse Practitioner

## 2024-03-13 ENCOUNTER — Non-Acute Institutional Stay (SKILLED_NURSING_FACILITY): Payer: Self-pay | Admitting: Nurse Practitioner

## 2024-03-13 DIAGNOSIS — K5901 Slow transit constipation: Secondary | ICD-10-CM | POA: Diagnosis not present

## 2024-03-13 DIAGNOSIS — D509 Iron deficiency anemia, unspecified: Secondary | ICD-10-CM | POA: Diagnosis not present

## 2024-03-13 DIAGNOSIS — K219 Gastro-esophageal reflux disease without esophagitis: Secondary | ICD-10-CM | POA: Diagnosis not present

## 2024-03-13 DIAGNOSIS — I495 Sick sinus syndrome: Secondary | ICD-10-CM | POA: Diagnosis not present

## 2024-03-13 DIAGNOSIS — M150 Primary generalized (osteo)arthritis: Secondary | ICD-10-CM | POA: Diagnosis not present

## 2024-03-13 DIAGNOSIS — I10 Essential (primary) hypertension: Secondary | ICD-10-CM

## 2024-03-13 DIAGNOSIS — I509 Heart failure, unspecified: Secondary | ICD-10-CM

## 2024-03-13 DIAGNOSIS — E039 Hypothyroidism, unspecified: Secondary | ICD-10-CM

## 2024-03-13 DIAGNOSIS — F0390 Unspecified dementia without behavioral disturbance: Secondary | ICD-10-CM | POA: Diagnosis not present

## 2024-03-13 DIAGNOSIS — F339 Major depressive disorder, recurrent, unspecified: Secondary | ICD-10-CM

## 2024-03-13 DIAGNOSIS — L89629 Pressure ulcer of left heel, unspecified stage: Secondary | ICD-10-CM | POA: Insufficient documentation

## 2024-03-13 DIAGNOSIS — I4819 Other persistent atrial fibrillation: Secondary | ICD-10-CM

## 2024-03-13 NOTE — Assessment & Plan Note (Signed)
 tolerated Namenda well, helped with her mood as well. MMSE 27/30 04/09/23, CT head 02/12/22 Mild age-related atrophy and chronic microvascular ischemic changes.

## 2024-03-13 NOTE — Assessment & Plan Note (Addendum)
 post op, off Iron , Hgb 12.8 12/10/23

## 2024-03-13 NOTE — Assessment & Plan Note (Signed)
 Stable, taking Senokot S, MiraLax 

## 2024-03-13 NOTE — Assessment & Plan Note (Signed)
 stable, on Pantoprazole.  Hgb 12.5 03/10/2024

## 2024-03-13 NOTE — Assessment & Plan Note (Addendum)
 Blood pressure is fluctuating, 154-126/68-50,  off Metoprolol  05/27/23 2/2 low Bp, on Furosemide ,  f/u Cardiology. Bun/creat 22/0.7 03/12/2024. Observe Bp, position change slowly.

## 2024-03-13 NOTE — Assessment & Plan Note (Signed)
 increased Levothyroxine  1119mcg/112mcg 02/14/24 TSH 4.206 11/07/23<<12.82 02/18/24, TSH 6 weeks.

## 2024-03-13 NOTE — Progress Notes (Signed)
 Location:   [facility] Room Number: 15 Place of Service:  SNF (31) Provider: Larwance Briani Maul NP  [name], MD  Patient Care Team: [name], MD as PCP - General (Internal Medicine) [name], MD as PCP - Electrophysiology (Cardiology) Loni Soyla LABOR, MD as PCP - Cardiology (Cardiology) [name], FNP [facility] and Palliative Medicine)  Extended Emergency Contact Information Primary Emergency Contact: [facility] Phone: [phone] Mobile Phone: [phone] Relation: Daughter Interpreter needed? No Secondary Emergency Contact: Pearman,Karen Address: [address]          [place], [place] [address] [place] Home Phone: [phone] Mobile Phone: [phone] Relation: Daughter  Code Status:  DNR Goals of care: Advanced Directive information    12/09/2023   10:55 AM  Advanced Directives  Does Patient Have a Medical Advance Directive? Yes  Type of Estate agent of [place];Out of facility DNR (pink MOST or yellow form)  Does patient want to make changes to medical advance directive? No - Patient declined  Copy of Healthcare Power of Attorney in Chart? Yes - validated most recent copy scanned in chart (See row information)     Chief Complaint  Patient presents with   Medical Management of Chronic Issues    HPI:  Pt is a 89 y.o. female seen today for medical management of chronic diseases.    Depression scored 2 on recent PHQ-9.  Tolerating Lexapro well, Na 140 03/12/2024              Hospitalized 11/06/23-11/12/23 for closed displaced intertrochanteric fx of R femur, s/p IM, WBAT. Hospital stay was complicated with acute blood loss anemia and acute respiratory failure with hypoxia.                BRBPR 05/23/23, FOBT positive 05/24/23, noted hemorrhoids, placed PPI, Hgb 12.8 12/10/23              01/11/23 family expressed concerning of hand and fingers tingling and numbness, desires  checking TSH 4.38 01/26/23 If Namenda  is the culprit. The patient stated her tingling and numbness in hands and fingers has been the same, declined workup or neurology referral.              R heel, pressure ulcer, improving, scab over             Chronic numbness of R+L hands, has been using fingerless gloves which helped, declined workup, neurology referral, or medication             Anemia, post op, off Iron , Hgb 12.8 12/10/23             Senile dementia, tolerated Namenda  well, helped with her mood as well. MMSE 27/30 04/09/23, CT head 02/12/22 Mild age-related atrophy and chronic microvascular ischemic changes.             Afib, off Metoprolol , on Amiodarone , followed by Cardiology, on  Eliquis .             AS moderate             HTN off Metoprolol  05/27/23 2/2 low Bp, on Furosemide ,  f/u Cardiology. Bun/creat 22/0.7 03/12/2024             Pacemaker, SSS,  f/u Cardiology             CHF, AS, edema BLE, on Furosemide . EF 70-75% 09/19/21, 03/27/22 Venous US , negative DVT. 03/30/22 arterial US  mild to moderate obstructive disease  CKD Bun/creat 22/0.7 03/12/2024             Dysphagia, nectar liquid.              Hypothyroidism, increased Levothyroxine  1140mcg/112mcg 02/14/24 TSH 4.206 11/07/23<<12.82 02/18/24, TSH 6 weeks.              OA, takes Tylenol , ambulates with walker prior to fx of R hip 11/06/23, w/c for mobility now.              GERD, stable, on Pantoprazole.  Hgb 12.5 03/10/2024             Constipation, taking Senokot S, MiraLax     Past Medical History:  Diagnosis Date   Aortic stenosis 03/08/2016   Brady-tachy syndrome (HCC)    a. Biotronik dual chamber PPM implanted 2009 b. gen change to STJ dual chamber PPM 2017   Ejection fraction    EF 70%, echo, October, 2009  //   EF 55-60%, septal dyssynergy consistent with a paced rhythm, mild to moderate mitral regurgitation, echo, November, 2015    GERD (gastroesophageal reflux disease) 04/28/2014   Episodes November, 2015 with  fluid refluxing from her esophagus.    Hair loss    Patient questioned Coumadin, changed toPradaxa   Hypertension    Hypothyroidism    Lower extremity edema 01/22/2018   Mitral regurgitation 05/03/2014   Mild-to-moderate mitral regurgitation, echo, November, 2015    Osteoarthritis of left knee 10/25/2017   Paroxysmal atrial fibrillation (HCC)    Episodes rapid atrial fib noted by pacemaker interrogation, August, 2011, diltiazem  added, patient improved. Patient continues on Rythmol   //   Changed to flecainide  2013  //   flecainide  level checked in 2013, good level    Persistent atrial fibrillation (HCC)    Presence of permanent cardiac pacemaker    Past Surgical History:  Procedure Laterality Date   ABDOMINAL HYSTERECTOMY     EP IMPLANTABLE DEVICE N/A 08/08/2015   Generator change with SJM Assurity DR PPM by Dr Kelsie   FEMUR IM NAIL Right 11/07/2023   Procedure: INSERTION, INTRAMEDULLARY ROD, FEMUR, RETROGRADE;  Surgeon: Genelle Standing, MD;  Location: [facility] OR;  Service: Orthopedics;  Laterality: Right;   JOINT REPLACEMENT     PACEMAKER PLACEMENT  2009             Review of Systems  Constitutional:  Negative for appetite change, fatigue and fever.  HENT:  Positive for hearing loss and trouble swallowing. Negative for congestion.   Eyes:  Negative for visual disturbance.  Respiratory:  Negative for cough, chest tightness and shortness of breath.   Cardiovascular:  Positive for leg swelling. Negative for chest pain and palpitations.  Gastrointestinal:  Negative for abdominal pain and constipation.  Genitourinary:  Negative for dysuria, frequency, hematuria and urgency.  Musculoskeletal:  Positive for arthralgias and gait problem.       Knees, L>R, intermittent in nature.  Left shoulder pain with ROM  Skin:  Positive for wound.       Chronic venous insufficiency skin changes: mild erythema in mid of R+L Right heel pressure ulcer wound   Neurological:  Positive for numbness.  Negative for tremors, speech difficulty, weakness and headaches.       Memory lapses. chronic numbness in fingers.   Psychiatric/Behavioral:  Negative for confusion and sleep disturbance. The patient is not nervous/anxious.        May be less smiling than usual upon my examination today?    Immunization History  Administered Date(s) Administered   Fluad Quad(high Dose 65+) 04/02/2022   Fluzone Influenza virus vaccine,trivalent (IIV3), split virus 02/24/2019, 03/08/2020, 03/25/2021   INFLUENZA, HIGH DOSE SEASONAL PF 03/08/2015, 04/03/2017, 03/17/2018, 03/08/2020, 04/15/2023   Influenza Split 03/11/2009, 03/11/2010, 02/25/2011   Influenza,inj,Quad PF,6+ Mos 03/08/2016   Influenza,inj,quad, With Preservative 03/11/2017   Moderna Covid-19 Vaccine  Bivalent Booster 72yrs & up 10/27/2021, 07/11/2023   Moderna SARS-COV2 Booster Vaccination 11/08/2020   Moderna Sars-Covid-2 Vaccination 06/15/2019, 07/13/2019, 04/19/2020, 02/27/2022   PNEUMOCOCCAL CONJUGATE-20 06/27/2023   Pfizer Covid-19 Vaccine Bivalent Booster 54yrs & up 02/28/2021, 04/12/2022   Pneumococcal Conjugate-13 12/06/2014   Pneumococcal Polysaccharide-23 05/13/2001, 03/11/2009   Polio, Unspecified 02/02/1958   Tdap 11/21/2011, 04/11/2023   Zoster Recombinant(Shingrix) 07/18/2022, 10/19/2022   Pertinent  Health Maintenance Due  Topic Date Due   Influenza Vaccine  01/10/2024   DEXA SCAN  Completed      02/16/2022    9:45 AM 02/27/2022   11:41 AM 07/06/2022   10:04 AM 07/13/2022    3:55 PM 03/25/2023    4:51 PM  Fall Risk  Falls in the past year?   0 0 1  Was there an injury with Fall?   0 0 0  Fall Risk Category Calculator   0 0 1  (RETIRED) Patient Fall Risk Level High fall risk  High fall risk      Patient at Risk for Falls Due to  History of fall(s) No Fall Risks No Fall Risks History of fall(s);Impaired balance/gait;Impaired mobility  Fall risk Follow up  Falls evaluation completed   Falls evaluation completed Falls  evaluation completed;Falls prevention discussed;Education provided     Data saved with a previous flowsheet row definition   Functional Status Survey:    Vitals:   03/13/24 1438 03/16/24 1159  BP: (!) 154/68 (!) 126/50  Pulse: 78   Resp: 18   Temp: (!) 97.2 F (36.2 C)   SpO2: 94%    There is no height or weight on file to calculate BMI. Physical Exam Vitals and nursing note reviewed.  Constitutional:      Appearance: Normal appearance.  HENT:     Head: Normocephalic and atraumatic.     Nose: Nose normal.     Mouth/Throat:     Mouth: Mucous membranes are moist.  Eyes:     Extraocular Movements: Extraocular movements intact.     Conjunctiva/sclera: Conjunctivae normal.     Pupils: Pupils are equal, round, and reactive to light.  Cardiovascular:     Rate and Rhythm: Normal rate. Rhythm irregular.     Heart sounds: Murmur heard.     Comments: No dorsalis pedis pulses felt from previous examination.  Pulmonary:     Effort: Pulmonary effort is normal.     Breath sounds: No rales.  Abdominal:     General: Bowel sounds are normal.     Palpations: Abdomen is soft.     Tenderness: There is no abdominal tenderness.  Genitourinary:    Comments: External hemorrhoids from previous examination Musculoskeletal:        General: Tenderness present. Normal range of motion.     Cervical back: Normal range of motion and neck supple.     Right lower leg: Edema present.     Left lower leg: Edema present.     Comments: Trace edema LLE, 1+ edema RLE Left shoulder pain with ROM R hip pain  Skin:    General: Skin is warm and dry.     Comments: Mild dark erythema  mid of R+L lower legs, chronic.   R hip surgical scar Left heel healing pressure ulcer with scab over in a small area   Neurological:     General: No focal deficit present.     Mental Status: She is alert and oriented to person, place, and time. Mental status is at baseline.     Gait: Gait abnormal.     Comments: 5/5 muscle  strength in fingers.   Psychiatric:        Mood and Affect: Mood normal.        Behavior: Behavior normal.     Comments: Seems less smile than usual upon my examination today.     Labs reviewed: Recent Labs    11/10/23 1820 11/11/23 0505 11/12/23 0608 11/19/23 0000 12/10/23 0000  NA 141 139 138 136* 138  K 3.5 3.4* 4.9 4.0  --   CL 99 101 102 102 102  CO2 30 31 31  28* 26*  GLUCOSE 123* 121* 105*  --   --   BUN 26* 27* 20 18 16   CREATININE 0.84 0.68 0.70 0.8 0.8  CALCIUM  8.4* 8.0* 8.1* 8.3* 8.9  MG 2.1 2.1 2.2  --   --   PHOS 2.0* 2.6 2.4*  --   --    Recent Labs    11/10/23 1820 11/11/23 0505 11/12/23 0608 11/19/23 0000 01/07/24 0000  AST 31 30 32 16 25  ALT 17 16 18 9 14   ALKPHOS 39 36* 38 92 102  BILITOT 0.8 1.3* 1.7*  --   --   PROT 5.5* 5.1* 5.2*  --   --   ALBUMIN 2.7* 2.6* 2.6* 3.1* 3.7   Recent Labs    11/11/23 0505 11/12/23 0608 11/19/23 0000 11/22/23 1039 12/10/23 0000  WBC 6.9 6.8 6.3 7.3 5.4  NEUTROABS 4.1 3.9  --   --  3,289.00  HGB 7.9* 7.6* 10.1* 11.4 12.8  HCT 24.7* 23.8* 32* 36.2 41  MCV 105.6* 105.3*  --  105*  --   PLT 126* 142* 202 240 143*   Lab Results  Component Value Date   TSH 12.82 (A) 02/18/2024   Lab Results  Component Value Date   HGBA1C 4.8 11/08/2023   Lab Results  Component Value Date   CHOL 159 09/14/2023   HDL 42 09/14/2023   LDLCALC 100 (H) 09/14/2023   TRIG 85 09/14/2023   CHOLHDL 3.8 09/14/2023    Significant Diagnostic Results in last 30 days:  No results found.  Assessment/Plan  Persistent atrial fibrillation (HCC) Heart rate is in control, off Metoprolol , on Amiodarone , followed by Cardiology, on  Eliquis .  Essential hypertension, benign Blood pressure is fluctuating, 154-126/68-50,  off Metoprolol  05/27/23 2/2 low Bp, on Furosemide ,  f/u Cardiology. Bun/creat 22/0.7 03/12/2024. Observe Bp, position change slowly.   Brady-tachy syndrome (HCC) Pacemaker, SSS,  f/u Cardiology 03/06/24 Cardiology,  echo 6 mos, f/u 1 year  Congestive heart failure (HCC)  CHF, AS, mild edema BLE, on Furosemide . EF 70-75% 09/19/21, 03/27/22 Venous US , negative DVT. 03/30/22 arterial US  mild to moderate obstructive disease  Hypothyroidism increased Levothyroxine  1113mcg/112mcg 02/14/24 TSH 4.206 11/07/23<<12.82 02/18/24, TSH 6 weeks.   Osteoarthritis, multiple sites takes Tylenol , ambulates with walker prior to fx of R hip 11/06/23, w/c for mobility now.   GERD (gastroesophageal reflux disease)  stable, on Pantoprazole.  Hgb 12.5 03/10/2024  Slow transit constipation Stable,  taking Senokot S, MiraLax   IDA (iron  deficiency anemia) post op, off Iron , Hgb 12.8 12/10/23   Major  neurocognitive disorder (HCC) tolerated Namenda  well, helped with her mood as well. MMSE 27/30 04/09/23, CT head 02/12/22 Mild age-related atrophy and chronic microvascular ischemic changes.  Recurrent depression scored 2 on recent PHQ-9.  Tolerating Lexapro well, Na 140 03/12/2024   Family/ staff Communication: Plan of care reviewed with the patient and charge nurse  Labs/tests ordered: None

## 2024-03-13 NOTE — Assessment & Plan Note (Deleted)
 tolerated Namenda well, helped with her mood as well. MMSE 27/30 04/09/23, CT head 02/12/22 Mild age-related atrophy and chronic microvascular ischemic changes.

## 2024-03-13 NOTE — Assessment & Plan Note (Signed)
 scored 2 on recent PHQ-9.  Tolerating Lexapro well, Na 140 03/12/2024

## 2024-03-13 NOTE — Assessment & Plan Note (Signed)
 takes Tylenol , ambulates with walker prior to fx of R hip 11/06/23, w/c for mobility now.

## 2024-03-13 NOTE — Assessment & Plan Note (Addendum)
 Pacemaker, SSS,  f/u Cardiology 03/06/24 Cardiology, echo 6 mos, f/u 1 year

## 2024-03-13 NOTE — Assessment & Plan Note (Signed)
 CHF, AS, mild edema BLE, on Furosemide . EF 70-75% 09/19/21, 03/27/22 Venous US , negative DVT. 03/30/22 arterial US  mild to moderate obstructive disease

## 2024-03-13 NOTE — Assessment & Plan Note (Signed)
 Heart rate is in control, off Metoprolol , on Amiodarone , followed by Cardiology, on  Eliquis .

## 2024-03-14 ENCOUNTER — Telehealth: Payer: Self-pay | Admitting: Adult Health

## 2024-03-14 MED ORDER — NITROFURANTOIN MACROCRYSTAL 100 MG PO CAPS: 100.0000 mg | ORAL_CAPSULE | Freq: Two times a day (BID) | ORAL | Status: AC

## 2024-03-14 NOTE — Telephone Encounter (Signed)
 Nurse from [facility] called to report urine culture which was positive for Ecoli with 10-49K  Pt is reported to have dysuria and confusion Several allergies are noted.  Will start macrodantin .

## 2024-03-26 ENCOUNTER — Non-Acute Institutional Stay (SKILLED_NURSING_FACILITY): Payer: Self-pay | Admitting: Nurse Practitioner

## 2024-03-26 ENCOUNTER — Encounter: Payer: Self-pay | Admitting: Nurse Practitioner

## 2024-03-26 DIAGNOSIS — I509 Heart failure, unspecified: Secondary | ICD-10-CM | POA: Diagnosis not present

## 2024-03-26 DIAGNOSIS — E039 Hypothyroidism, unspecified: Secondary | ICD-10-CM

## 2024-03-26 DIAGNOSIS — K219 Gastro-esophageal reflux disease without esophagitis: Secondary | ICD-10-CM

## 2024-03-26 DIAGNOSIS — I480 Paroxysmal atrial fibrillation: Secondary | ICD-10-CM

## 2024-03-26 DIAGNOSIS — F339 Major depressive disorder, recurrent, unspecified: Secondary | ICD-10-CM

## 2024-03-26 DIAGNOSIS — L600 Ingrowing nail: Secondary | ICD-10-CM | POA: Insufficient documentation

## 2024-03-26 DIAGNOSIS — I10 Essential (primary) hypertension: Secondary | ICD-10-CM

## 2024-03-26 DIAGNOSIS — F0390 Unspecified dementia without behavioral disturbance: Secondary | ICD-10-CM

## 2024-03-26 NOTE — Assessment & Plan Note (Signed)
 Heart rate is in control, off Metoprolol , on Amiodarone , followed by Cardiology, on  Eliquis .

## 2024-03-26 NOTE — Assessment & Plan Note (Signed)
 tolerated Namenda well, helped with her mood as well. MMSE 27/30 04/09/23, CT head 02/12/22 Mild age-related atrophy and chronic microvascular ischemic changes.

## 2024-03-26 NOTE — Assessment & Plan Note (Signed)
 stable, on Pantoprazole.  Hgb 12.5 03/10/2024

## 2024-03-26 NOTE — Assessment & Plan Note (Signed)
 Blood pressure is in control off Metoprolol  05/27/23 2/2 low Bp, on Furosemide ,  f/u Cardiology. Bun/creat 22/0.7 03/12/2024

## 2024-03-26 NOTE — Assessment & Plan Note (Signed)
 Her mood is stable, scored 2 on recent PHQ-9.  Tolerating Lexapro well, Na 140 03/12/2024

## 2024-03-26 NOTE — Progress Notes (Signed)
 Location:  [facility]ursing Home Room Number: [phone] A Place of Service:  SNF (31) Provider:  Adline Scot, NP   [name], MD  Patient Care Team: [name], MD as PCP - General (Internal Medicine) [name], MD as PCP - Electrophysiology (Cardiology) Loni Soyla LABOR, MD as PCP - Cardiology (Cardiology) [name], FNP [facility] and Pall[facility])  Extended Emergency Contact Information Primary Emergency Contact: [facility] Phone: [phone] Mobile Phone: [phone] Relation: Daughter Interpreter needed? No Secondary Emergency Contact: Pearman,Karen Address: [address]          [place], [place] [address] [place] Home Phone: [phone] Mobile Phone: [phone] Relation: Daughter  Code Status:  DNR Goals of care: Advanced Directive information    03/26/2024    9:50 AM  Advanced Directives  Does Patient Have a Medical Advance Directive? Yes  Type of Estate agent of [place];Out of facility DNR (pink MOST or yellow form)  Does patient want to make changes to medical advance directive? No - Patient declined  Copy of Healthcare Power of Attorney in Chart? Yes - validated most recent copy scanned in chart (See row information)     Chief Complaint  Patient presents with   Acute Visit    Left ingrown toenail    HPI:  Pt is a 89 y.o. female seen today for an acute visit for left great toe ingrown toenail, dried dark blood seen at the tip of toe, no redness, swelling, or warmth  Edema at her baseline, no increased respiratory symptoms, no O2 desaturation, weight fluctuating, at baseline as well.    Depression scored 2 on recent PHQ-9.  Tolerating Lexapro well, Na 140 03/12/2024              Hospitalized 11/06/23-11/12/23 for closed displaced intertrochanteric fx of R femur, s/p IM, WBAT. Hospital stay was complicated with acute blood loss anemia and acute respiratory  failure with hypoxia.                BRBPR 05/23/23, FOBT positive 05/24/23, noted hemorrhoids, placed PPI, Hgb 12.8 12/10/23              01/11/23 family expressed concerning of hand and fingers tingling and numbness, desires checking TSH 4.38 01/26/23 If Namenda  is the culprit. The patient stated her tingling and numbness in hands and fingers has been the same, declined workup or neurology referral.              R heel, pressure ulcer, improving, scab over             Chronic numbness of R+L hands, has been using fingerless gloves which helped, declined workup, neurology referral, or medication             Anemia, post op, off Iron , Hgb 12.8 12/10/23             Senile dementia, tolerated Namenda  well, helped with her mood as well. MMSE 27/30 04/09/23, CT head 02/12/22 Mild age-related atrophy and chronic microvascular ischemic changes.             Afib, off Metoprolol , on Amiodarone , followed by Cardiology, on  Eliquis .             AS moderate             HTN off Metoprolol  05/27/23 2/2 low Bp, on Furosemide ,  f/u Cardiology. Bun/creat 22/0.7 03/12/2024  Pacemaker, SSS,  f/u Cardiology             CHF, AS, edema BLE, on Furosemide . EF 70-75% 09/19/21, 03/27/22 Venous US , negative DVT. 03/30/22 arterial US  mild to moderate obstructive disease             CKD Bun/creat 22/0.7 03/12/2024             Dysphagia, nectar liquid.              Hypothyroidism, increased Levothyroxine  1117mcg/112mcg 02/14/24 TSH 4.206 11/07/23<<12.82 02/18/24, TSH 6 weeks.              OA, takes Tylenol , ambulates with walker prior to fx of R hip 11/06/23, w/c for mobility now.              GERD, stable, on Pantoprazole.  Hgb 12.5 03/10/2024             Constipation, taking Senokot S, MiraLax   Past Medical History:  Diagnosis Date   Aortic stenosis 03/08/2016   Brady-tachy syndrome (HCC)    a. Biotronik dual chamber PPM implanted 2009 b. gen change to STJ dual chamber PPM 2017   Ejection fraction    EF 70%, echo,  October, 2009  //   EF 55-60%, septal dyssynergy consistent with a paced rhythm, mild to moderate mitral regurgitation, echo, November, 2015    GERD (gastroesophageal reflux disease) 04/28/2014   Episodes November, 2015 with fluid refluxing from her esophagus.    Hair loss    Patient questioned Coumadin, changed toPradaxa   Hypertension    Hypothyroidism    Lower extremity edema 01/22/2018   Mitral regurgitation 05/03/2014   Mild-to-moderate mitral regurgitation, echo, November, 2015    Osteoarthritis of left knee 10/25/2017   Paroxysmal atrial fibrillation (HCC)    Episodes rapid atrial fib noted by pacemaker interrogation, August, 2011, diltiazem  added, patient improved. Patient continues on Rythmol   //   Changed to flecainide  2013  //   flecainide  level checked in 2013, good level    Persistent atrial fibrillation (HCC)    Presence of permanent cardiac pacemaker    Past Surgical History:  Procedure Laterality Date   ABDOMINAL HYSTERECTOMY     EP IMPLANTABLE DEVICE N/A 08/08/2015   Generator change with SJM Assurity DR PPM by Dr Kelsie   FEMUR IM NAIL Right 11/07/2023   Procedure: INSERTION, INTRAMEDULLARY ROD, FEMUR, RETROGRADE;  Surgeon: Genelle Standing, MD;  Location: [facility] OR;  Service: Orthopedics;  Laterality: Right;   JOINT REPLACEMENT     PACEMAKER PLACEMENT  2009             Review of Systems  Constitutional:  Negative for appetite change, fatigue and fever.  HENT:  Positive for hearing loss and trouble swallowing. Negative for congestion.   Eyes:  Negative for visual disturbance.  Respiratory:  Negative for cough, chest tightness and shortness of breath.   Cardiovascular:  Positive for leg swelling. Negative for chest pain and palpitations.  Gastrointestinal:  Negative for abdominal pain and constipation.  Genitourinary:  Negative for dysuria, frequency, hematuria and urgency.  Musculoskeletal:  Positive for arthralgias and gait problem.       Knees, L>R, intermittent  in nature.  Left shoulder pain with ROM  Skin:  Positive for wound.       Chronic venous insufficiency skin changes: mild erythema in mid of R+L Right heel pressure ulcer wound   Neurological:  Positive for numbness. Negative for tremors, speech difficulty, weakness and headaches.  Memory lapses. chronic numbness in fingers.   Psychiatric/Behavioral:  Negative for confusion and sleep disturbance. The patient is not nervous/anxious.        May be less smiling than usual upon my examination today?    Immunization History  Administered Date(s) Administered   Fluad Quad(high Dose 65+) 04/02/2022   Fluzone Influenza virus vaccine,trivalent (IIV3), split virus 02/24/2019, 03/08/2020, 03/25/2021   INFLUENZA, HIGH DOSE SEASONAL PF 03/08/2015, 04/03/2017, 03/17/2018, 03/08/2020, 04/15/2023   Influenza Split 03/11/2009, 03/11/2010, 02/25/2011   Influenza,inj,Quad PF,6+ Mos 03/08/2016   Influenza,inj,quad, With Preservative 03/11/2017   Influenza-Unspecified 03/17/2024   Moderna Covid-19 Vaccine  Bivalent Booster 19yrs & up 10/27/2021, 07/11/2023   Moderna SARS-COV2 Booster Vaccination 11/08/2020   Moderna Sars-Covid-2 Vaccination 06/15/2019, 07/13/2019, 04/19/2020, 02/27/2022   PNEUMOCOCCAL CONJUGATE-20 06/27/2023   Pfizer Covid-19 Vaccine Bivalent Booster 76yrs & up 02/28/2021, 04/12/2022   Pneumococcal Conjugate-13 12/06/2014   Pneumococcal Polysaccharide-23 05/13/2001, 03/11/2009   Polio, Unspecified 02/02/1958   Tdap 11/21/2011, 04/11/2023   Zoster Recombinant(Shingrix) 07/18/2022, 10/19/2022   Pertinent  Health Maintenance Due  Topic Date Due   Influenza Vaccine  Completed   DEXA SCAN  Completed      02/16/2022    9:45 AM 02/27/2022   11:41 AM 07/06/2022   10:04 AM 07/13/2022    3:55 PM 03/25/2023    4:51 PM  Fall Risk  Falls in the past year?   0 0 1  Was there an injury with Fall?   0 0 0  Fall Risk Category Calculator   0 0 1  (RETIRED) Patient Fall Risk Level High fall  risk  High fall risk      Patient at Risk for Falls Due to  History of fall(s) No Fall Risks No Fall Risks History of fall(s);Impaired balance/gait;Impaired mobility  Fall risk Follow up  Falls evaluation completed   Falls evaluation completed Falls evaluation completed;Falls prevention discussed;Education provided     Data saved with a previous flowsheet row definition   Functional Status Survey:    Vitals:   03/26/24 0948  BP: 120/68  Pulse: 68  Temp: 97.7 F (36.5 C)  SpO2: 92%  Weight: 189 lb 1.6 oz (85.8 kg)  Height: 5' 3 (1.6 m)   Body mass index is 33.5 kg/m. Physical Exam Vitals and nursing note reviewed.  Constitutional:      Appearance: Normal appearance.  HENT:     Head: Normocephalic and atraumatic.     Nose: Nose normal.     Mouth/Throat:     Mouth: Mucous membranes are moist.  Eyes:     Extraocular Movements: Extraocular movements intact.     Conjunctiva/sclera: Conjunctivae normal.     Pupils: Pupils are equal, round, and reactive to light.  Cardiovascular:     Rate and Rhythm: Normal rate. Rhythm irregular.     Heart sounds: Murmur heard.     Comments: No dorsalis pedis pulses felt from previous examination.  Pulmonary:     Effort: Pulmonary effort is normal.     Breath sounds: No rales.  Abdominal:     General: Bowel sounds are normal.     Palpations: Abdomen is soft.     Tenderness: There is no abdominal tenderness.  Genitourinary:    Comments: External hemorrhoids from previous examination Musculoskeletal:        General: Tenderness present. Normal range of motion.     Cervical back: Normal range of motion and neck supple.     Right lower leg: Edema present.  Left lower leg: Edema present.     Comments: Trace edema LLE, 1+ edema RLE Left shoulder pain with ROM R hip pain  Skin:    General: Skin is warm and dry.     Comments: Mild dark erythema mid of R+L lower legs, chronic.   R hip surgical scar Left heel healing pressure ulcer with  scab over in a small area The left great toe ingrown toenail, dry blood at tip of toe, no redness, warmth, swelling noted.   Neurological:     General: No focal deficit present.     Mental Status: She is alert and oriented to person, place, and time. Mental status is at baseline.     Gait: Gait abnormal.     Comments: 5/5 muscle strength in fingers.   Psychiatric:        Mood and Affect: Mood normal.        Behavior: Behavior normal.     Comments: Seems less smile than usual upon my examination today.     Labs reviewed: Recent Labs    11/10/23 1820 11/11/23 0505 11/12/23 0608 11/19/23 0000 12/10/23 0000  NA 141 139 138 136* 138  K 3.5 3.4* 4.9 4.0  --   CL 99 101 102 102 102  CO2 30 31 31  28* 26*  GLUCOSE 123* 121* 105*  --   --   BUN 26* 27* 20 18 16   CREATININE 0.84 0.68 0.70 0.8 0.8  CALCIUM  8.4* 8.0* 8.1* 8.3* 8.9  MG 2.1 2.1 2.2  --   --   PHOS 2.0* 2.6 2.4*  --   --    Recent Labs    11/10/23 1820 11/11/23 0505 11/12/23 0608 11/19/23 0000 01/07/24 0000  AST 31 30 32 16 25  ALT 17 16 18 9 14   ALKPHOS 39 36* 38 92 102  BILITOT 0.8 1.3* 1.7*  --   --   PROT 5.5* 5.1* 5.2*  --   --   ALBUMIN 2.7* 2.6* 2.6* 3.1* 3.7   Recent Labs    11/11/23 0505 11/12/23 0608 11/19/23 0000 11/22/23 1039 12/10/23 0000  WBC 6.9 6.8 6.3 7.3 5.4  NEUTROABS 4.1 3.9  --   --  3,289.00  HGB 7.9* 7.6* 10.1* 11.4 12.8  HCT 24.7* 23.8* 32* 36.2 41  MCV 105.6* 105.3*  --  105*  --   PLT 126* 142* 202 240 143*   Lab Results  Component Value Date   TSH 12.82 (A) 02/18/2024   Lab Results  Component Value Date   HGBA1C 4.8 11/08/2023   Lab Results  Component Value Date   CHOL 159 09/14/2023   HDL 42 09/14/2023   LDLCALC 100 (H) 09/14/2023   TRIG 85 09/14/2023   CHOLHDL 3.8 09/14/2023    Significant Diagnostic Results in last 30 days:  No results found.  Assessment/Plan Ingrowing toenail of left foot Left great toenail left great toe ingrown toenail, dried dark  blood seen at the tip of toe, no redness, swelling, or warmth Monitor for s/s of infection May consider Doxy if needed Podiatry referra.   Congestive heart failure (HCC) Edema at her baseline, no increased respiratory symptoms, no O2 desaturation, weight fluctuating, at baseline as well.  on Furosemide . EF 70-75% 09/19/21, 03/27/22 Venous US , negative DVT. 03/30/22 arterial US  mild to moderate obstructive disease   Major neurocognitive disorder (HCC) tolerated Namenda  well, helped with her mood as well. MMSE 27/30 04/09/23, CT head 02/12/22 Mild age-related atrophy and chronic microvascular ischemic  changes.  Paroxysmal atrial fibrillation (HCC) Heart rate is in control, off Metoprolol , on Amiodarone , followed by Cardiology, on  Eliquis .  Essential hypertension, benign Blood pressure is in control off Metoprolol  05/27/23 2/2 low Bp, on Furosemide ,  f/u Cardiology. Bun/creat 22/0.7 03/12/2024  Hypothyroidism  increased Levothyroxine  1139mcg/112mcg 02/14/24 TSH 4.206 11/07/23<<12.82 02/18/24, TSH 6 weeks.   GERD (gastroesophageal reflux disease) stable, on Pantoprazole.  Hgb 12.5 03/10/2024  Recurrent depression Her mood is stable, scored 2 on recent PHQ-9.  Tolerating Lexapro well, Na 140 03/12/2024    Family/ staff Communication: plan of care reviewed with the patient and charge nurse.   Labs/tests ordered:  none

## 2024-03-26 NOTE — Assessment & Plan Note (Signed)
 increased Levothyroxine  1119mcg/112mcg 02/14/24 TSH 4.206 11/07/23<<12.82 02/18/24, TSH 6 weeks.

## 2024-03-26 NOTE — Assessment & Plan Note (Signed)
 Left great toenail left great toe ingrown toenail, dried dark blood seen at the tip of toe, no redness, swelling, or warmth Monitor for s/s of infection May consider Doxy if needed Podiatry referra.

## 2024-03-26 NOTE — Assessment & Plan Note (Signed)
 Edema at her baseline, no increased respiratory symptoms, no O2 desaturation, weight fluctuating, at baseline as well.  on Furosemide . EF 70-75% 09/19/21, 03/27/22 Venous US , negative DVT. 03/30/22 arterial US  mild to moderate obstructive disease

## 2024-03-31 DIAGNOSIS — E039 Hypothyroidism, unspecified: Secondary | ICD-10-CM | POA: Diagnosis not present

## 2024-03-31 LAB — TSH: TSH: 9.05 — AB (ref 0.41–5.90)

## 2024-04-03 ENCOUNTER — Encounter: Payer: Self-pay | Admitting: Sports Medicine

## 2024-04-03 ENCOUNTER — Non-Acute Institutional Stay (SKILLED_NURSING_FACILITY): Payer: Self-pay | Admitting: Sports Medicine

## 2024-04-03 DIAGNOSIS — I509 Heart failure, unspecified: Secondary | ICD-10-CM

## 2024-04-03 DIAGNOSIS — I480 Paroxysmal atrial fibrillation: Secondary | ICD-10-CM | POA: Diagnosis not present

## 2024-04-03 DIAGNOSIS — E039 Hypothyroidism, unspecified: Secondary | ICD-10-CM | POA: Diagnosis not present

## 2024-04-03 DIAGNOSIS — L89610 Pressure ulcer of right heel, unstageable: Secondary | ICD-10-CM

## 2024-04-03 NOTE — Progress Notes (Signed)
 Careteam: Patient Care Team: [name], MD as PCP - General (Internal Medicine) [name], MD as PCP - Electrophysiology (Cardiology) Loni Soyla LABOR, MD as PCP - Cardiology (Cardiology) [name], FNP (Hospice and Palliative Medicine)  PLACE OF SERVICE:  [facility]  Advanced Directive information    Allergies  Allergen Reactions   Morphine And Codeine Other (See Comments)    Patient states that she went crazy with this   Penicillins Swelling and Rash        Aspirin      Pacemaker   Clindamycin/Lincomycin Other (See Comments)    Any meds with mycin in the name Unknown reaction Not listed on the [facility]   Crestor  [Rosuvastatin ]     Other reaction(s): muscle pain Not listed on the [facility]   Diltiazem      Skin discoloration, lower extremity swelling Not listed on the [facility]   Erythromycin Other (See Comments)    Any meds with mycin in the name Unknown reaction Not listed on the [facility]   Lisinopril Other (See Comments)    REACTION: Cough   Metronidazole     Other reaction(s): mouth sores, tongue swelling   Morphine    Penicillins Cross Reactors Other (See Comments)    Any meds with mycin in the name Unknown reaction    No chief complaint on file.       History of Present Illness  89 yr old F with h/o dementia,  Afib, AS, HTN, CHF, Hypothyroidism is seen today for acute visit for  erythema and pain at the base of Rt heel  Nurse reports that pt has been complaining of pain on touch since yesterday  She is wearing bunny boots   CHF  Pt is able to speak in full sentences Currently on lasix  20 mg daily    Review of Systems:  Review of Systems  Constitutional:  Negative for chills and fever.  Respiratory:  Negative for cough, sputum production and shortness of breath.   Cardiovascular:  Positive for leg swelling. Negative for chest pain and palpitations.  Gastrointestinal:  Negative for abdominal pain, heartburn and nausea.   Neurological:  Negative for dizziness.   Negative unless indicated in HPI.   Past Medical History:  Diagnosis Date   Aortic stenosis 03/08/2016   Brady-tachy syndrome (HCC)    a. Biotronik dual chamber PPM implanted 2009 b. gen change to STJ dual chamber PPM 2017   Ejection fraction    EF 70%, echo, October, 2009  //   EF 55-60%, septal dyssynergy consistent with a paced rhythm, mild to moderate mitral regurgitation, echo, November, 2015    GERD (gastroesophageal reflux disease) 04/28/2014   Episodes November, 2015 with fluid refluxing from her esophagus.    Hair loss    Patient questioned Coumadin, changed toPradaxa   Hypertension    Hypothyroidism    Lower extremity edema 01/22/2018   Mitral regurgitation 05/03/2014   Mild-to-moderate mitral regurgitation, echo, November, 2015    Osteoarthritis of left knee 10/25/2017   Paroxysmal atrial fibrillation (HCC)    Episodes rapid atrial fib noted by pacemaker interrogation, August, 2011, diltiazem  added, patient improved. Patient continues on Rythmol   //   Changed to flecainide  2013  //   flecainide  level checked in 2013, good level    Persistent atrial fibrillation (HCC)    Presence of permanent cardiac pacemaker    Past Surgical History:  Procedure Laterality Date   ABDOMINAL HYSTERECTOMY     EP IMPLANTABLE DEVICE N/A 08/08/2015  Generator change with SJM Assurity DR PPM by Dr Kelsie   FEMUR IM NAIL Right 11/07/2023   Procedure: INSERTION, INTRAMEDULLARY ROD, FEMUR, RETROGRADE;  Surgeon: Genelle Standing, MD;  Location: [facility] OR;  Service: Orthopedics;  Laterality: Right;   JOINT REPLACEMENT     PACEMAKER PLACEMENT  2009       Social History:   reports that she quit smoking about 33 years ago. Her smoking use included cigarettes. She has never used smokeless tobacco. She reports that she does not drink alcohol  and does not use drugs.  Family History  Problem Relation Age of Onset   Heart disease Father    Coronary artery disease  Other    Heart disease Son     Medications: Patient's Medications  New Prescriptions   No medications on file  Previous Medications   ACETAMINOPHEN  (TYLENOL ) 325 MG TABLET    Take 650 mg by mouth every 4 (four) hours as needed for mild pain (pain score 1-3) (or fever).   ALBUTEROL  (PROVENTIL ) (2.5 MG/3ML) 0.083% NEBULIZER SOLUTION    Take 3 mLs (2.5 mg total) by nebulization every 6 (six) hours as needed for wheezing or shortness of breath.   AMIODARONE  (PACERONE ) 200 MG TABLET    Take 200 mg by mouth daily.   APIXABAN  (ELIQUIS ) 5 MG TABS TABLET    Take 1 tablet (5 mg total) by mouth 2 (two) times daily.   APOAEQUORIN (PREVAGEN) 10 MG CAPS    Take 10 mg by mouth daily.   BETAMETHASONE , AUGMENTED, (DIPROLENE ) 0.05 % GEL    Apply 1 application  topically every 12 (twelve) hours as needed.   BISACODYL  (DULCOLAX) 10 MG SUPPOSITORY    Place 1 suppository (10 mg total) rectally daily as needed for moderate constipation.   BRIMONIDINE -TIMOLOL  (COMBIGAN ) 0.2-0.5 % OPHTHALMIC SOLUTION    Place 1 drop into the right eye every 12 (twelve) hours.   CALCIUM  CARBONATE-VIT D-MIN (CALCIUM  600+D PLUS MINERALS) 600-400 MG-UNIT TABS    Take 1 tablet by mouth daily.   DICLOFENAC  SODIUM (VOLTAREN ) 1 % GEL    Apply 1 g topically 2 (two) times daily as needed (to L foot bunion deformity for pain).   ESCITALOPRAM (LEXAPRO) 5 MG TABLET    Take 5 mg by mouth daily.   FUROSEMIDE  (LASIX ) 20 MG TABLET    TAKE ONE TABLET BY MOUTH EVERY DAY   IPRATROPIUM-ALBUTEROL  (COMBIVENT) 20-100 MCG/ACT AERS RESPIMAT    Inhale 1 puff into the lungs every 6 (six) hours as needed for shortness of breath.   IRON , FERROUS SULFATE , 325 (65 FE) MG TABS    Take 325 mg by mouth daily.   LACTOSE FREE NUTRITION (BOOST) LIQD    Take 237 mLs by mouth 2 (two) times daily between meals.   LEVOTHYROXINE  (SYNTHROID ) 137 MCG TABLET    Take 137 mcg by mouth daily before breakfast.   LIDOCAINE  (HM LIDOCAINE  PATCH) 4 %    Place 1 patch onto the skin in  the morning. Apply topically to L shoulder in the morning. Apply for 12 hours (0900) then removed for 12 hours (2100).   MELATONIN 5 MG TABS    Take 1 tablet (5 mg total) by mouth at bedtime.   MULTIPLE VITAMIN (MULTIVITAMIN ADULT) TABS    Take 1 tablet by mouth in the morning.   MULTIPLE VITAMINS-MINERALS (OCUVITE PO)    Take 1 capsule by mouth in the morning.   NAMENDA  10 MG TABLET    TAKE ONE TABLET BY MOUTH  TWICE A DAY FOR COGNITIVE DECLINE   NYSTATIN CREAM (MYCOSTATIN)    Apply 1 Application topically every 12 (twelve) hours as needed (for rash).   ONDANSETRON  (ZOFRAN ) 4 MG TABLET    Take 1 tablet (4 mg total) by mouth every 6 (six) hours as needed for nausea.   OXYCODONE  (ROXICODONE ) 5 MG IMMEDIATE RELEASE TABLET    Take 1 tablet (5 mg total) by mouth every 6 (six) hours as needed for severe pain (pain score 7-10).   PANTOPRAZOLE (PROTONIX) 40 MG TABLET    Take 40 mg by mouth daily as needed (for indigestion).   POLYETHYL GLYC-PROPYL GLYC PF (SYSTANE HYDRATION PF) 0.4-0.3 % SOLN    Place 1 drop into the right eye in the morning, at noon, in the evening, and at bedtime.   POLYETHYLENE GLYCOL (MIRALAX  / GLYCOLAX ) 17 G PACKET    Take 17 g by mouth every other day.   POTASSIUM CHLORIDE  (KLOR-CON  M) 10 MEQ TABLET    Take 1 tablet (10 mEq total) by mouth daily.   SENNA-DOCUSATE (SENOKOT-S) 8.6-50 MG TABLET    Take 1 tablet by mouth at bedtime as needed for mild constipation.   SENNOSIDES-DOCUSATE SODIUM  (SENOKOT-S) 8.6-50 MG TABLET    Take 1 tablet by mouth daily.  Modified Medications   No medications on file  Discontinued Medications   No medications on file    Physical Exam: There were no vitals filed for this visit. There is no height or weight on file to calculate BMI. BP Readings from Last 3 Encounters:  03/26/24 120/68  03/16/24 (!) 126/50  03/09/24 130/76   Wt Readings from Last 3 Encounters:  03/26/24 189 lb 1.6 oz (85.8 kg)  03/09/24 185 lb 4.8 oz (84.1 kg)  03/06/24 185 lb  (83.9 kg)    Physical Exam Constitutional:      Appearance: Normal appearance.  HENT:     Head: Normocephalic and atraumatic.  Cardiovascular:     Rate and Rhythm: Normal rate and regular rhythm.  Pulmonary:     Effort: Pulmonary effort is normal. No respiratory distress.     Breath sounds: Normal breath sounds. No wheezing.  Abdominal:     General: Bowel sounds are normal. There is no distension.     Tenderness: There is no abdominal tenderness. There is no guarding or rebound.     Comments:    Musculoskeletal:        General: Swelling present.  Skin:    Comments: Erythema at the base of rt heel Warmth +   Neurological:     Mental Status: She is alert. Mental status is at baseline.     Sensory: No sensory deficit.     Motor: No weakness.     Labs reviewed: Basic Metabolic Panel: Recent Labs    11/07/23 0645 11/08/23 0656 11/10/23 1820 11/11/23 0505 11/12/23 0608 11/19/23 0000 12/10/23 0000 01/07/24 0000 02/18/24 0000  NA 138   < > 141 139 138 136* 138  --   --   K 3.6   < > 3.5 3.4* 4.9 4.0  --   --   --   CL 105   < > 99 101 102 102 102  --   --   CO2 25   < > 30 31 31  28* 26*  --   --   GLUCOSE 111*   < > 123* 121* 105*  --   --   --   --   BUN 19   < >  26* 27* 20 18 16   --   --   CREATININE 0.77   < > 0.84 0.68 0.70 0.8 0.8  --   --   CALCIUM  8.9   < > 8.4* 8.0* 8.1* 8.3* 8.9  --   --   MG  --    < > 2.1 2.1 2.2  --   --   --   --   PHOS  --    < > 2.0* 2.6 2.4*  --   --   --   --   TSH 4.206  --   --   --   --   --   --  22.79* 12.82*   < > = values in this interval not displayed.   Liver Function Tests: Recent Labs    11/10/23 1820 11/11/23 0505 11/12/23 0608 11/19/23 0000 01/07/24 0000  AST 31 30 32 16 25  ALT 17 16 18 9 14   ALKPHOS 39 36* 38 92 102  BILITOT 0.8 1.3* 1.7*  --   --   PROT 5.5* 5.1* 5.2*  --   --   ALBUMIN 2.7* 2.6* 2.6* 3.1* 3.7   No results for input(s): LIPASE, AMYLASE in the last 8760 hours. No results for input(s):  AMMONIA in the last 8760 hours. CBC: Recent Labs    11/11/23 0505 11/12/23 0608 11/19/23 0000 11/22/23 1039 12/10/23 0000  WBC 6.9 6.8 6.3 7.3 5.4  NEUTROABS 4.1 3.9  --   --  3,289.00  HGB 7.9* 7.6* 10.1* 11.4 12.8  HCT 24.7* 23.8* 32* 36.2 41  MCV 105.6* 105.3*  --  105*  --   PLT 126* 142* 202 240 143*   Lipid Panel: Recent Labs    09/14/23 0528  CHOL 159  HDL 42  LDLCALC 100*  TRIG 85  CHOLHDL 3.8   TSH: Recent Labs    11/07/23 0645 01/07/24 0000 02/18/24 0000  TSH 4.206 22.79* 12.82*   A1C: Lab Results  Component Value Date   HGBA1C 4.8 11/08/2023    Assessment and Plan Assessment & Plan   Rt heel pressure ulcer  Erythema , tenderness+ Will start doxycycline  Cont with bunny boots      CHF Cont with lasix   Avoid salty foods   Hypothyroidism  Cont with levothyroxine    GAD  Cont with lexapro

## 2024-04-06 DIAGNOSIS — Z23 Encounter for immunization: Secondary | ICD-10-CM | POA: Diagnosis not present

## 2024-04-10 ENCOUNTER — Ambulatory Visit: Payer: Medicare Other

## 2024-04-10 DIAGNOSIS — I480 Paroxysmal atrial fibrillation: Secondary | ICD-10-CM | POA: Diagnosis not present

## 2024-04-11 LAB — CUP PACEART REMOTE DEVICE CHECK
Battery Remaining Longevity: 29 mo
Battery Remaining Percentage: 23 %
Battery Voltage: 2.92 V
Brady Statistic AP VP Percent: 1.6 %
Brady Statistic AP VS Percent: 65 %
Brady Statistic AS VP Percent: 1 %
Brady Statistic AS VS Percent: 33 %
Brady Statistic RA Percent Paced: 66 %
Brady Statistic RV Percent Paced: 1.6 %
Date Time Interrogation Session: 20251031040017
Implantable Lead Connection Status: 753985
Implantable Lead Connection Status: 753985
Implantable Lead Implant Date: 20091109
Implantable Lead Implant Date: 20091109
Implantable Lead Location: 753859
Implantable Lead Location: 753860
Implantable Lead Model: 350
Implantable Lead Serial Number: 24891460
Implantable Lead Serial Number: 28411861
Implantable Pulse Generator Implant Date: 20170227
Lead Channel Impedance Value: 1325 Ohm
Lead Channel Impedance Value: 530 Ohm
Lead Channel Pacing Threshold Amplitude: 0.75 V
Lead Channel Pacing Threshold Amplitude: 0.75 V
Lead Channel Pacing Threshold Pulse Width: 0.4 ms
Lead Channel Pacing Threshold Pulse Width: 0.4 ms
Lead Channel Sensing Intrinsic Amplitude: 1.8 mV
Lead Channel Sensing Intrinsic Amplitude: 12 mV
Lead Channel Setting Pacing Amplitude: 2 V
Lead Channel Setting Pacing Amplitude: 2.5 V
Lead Channel Setting Pacing Pulse Width: 0.4 ms
Lead Channel Setting Sensing Sensitivity: 2 mV
Pulse Gen Model: 2240
Pulse Gen Serial Number: 7885781

## 2024-04-13 ENCOUNTER — Encounter: Payer: Self-pay | Admitting: Radiology

## 2024-04-14 ENCOUNTER — Ambulatory Visit: Payer: Self-pay | Admitting: Cardiology

## 2024-04-15 ENCOUNTER — Encounter: Payer: Self-pay | Admitting: Nurse Practitioner

## 2024-04-15 ENCOUNTER — Non-Acute Institutional Stay (SKILLED_NURSING_FACILITY): Payer: Self-pay | Admitting: Nurse Practitioner

## 2024-04-15 DIAGNOSIS — F0390 Unspecified dementia without behavioral disturbance: Secondary | ICD-10-CM

## 2024-04-15 DIAGNOSIS — F339 Major depressive disorder, recurrent, unspecified: Secondary | ICD-10-CM | POA: Diagnosis not present

## 2024-04-15 DIAGNOSIS — I10 Essential (primary) hypertension: Secondary | ICD-10-CM | POA: Diagnosis not present

## 2024-04-15 DIAGNOSIS — E039 Hypothyroidism, unspecified: Secondary | ICD-10-CM

## 2024-04-15 DIAGNOSIS — K5901 Slow transit constipation: Secondary | ICD-10-CM | POA: Diagnosis not present

## 2024-04-15 DIAGNOSIS — I4819 Other persistent atrial fibrillation: Secondary | ICD-10-CM

## 2024-04-15 DIAGNOSIS — M150 Primary generalized (osteo)arthritis: Secondary | ICD-10-CM | POA: Diagnosis not present

## 2024-04-15 DIAGNOSIS — I509 Heart failure, unspecified: Secondary | ICD-10-CM

## 2024-04-15 DIAGNOSIS — K219 Gastro-esophageal reflux disease without esophagitis: Secondary | ICD-10-CM | POA: Diagnosis not present

## 2024-04-15 DIAGNOSIS — I495 Sick sinus syndrome: Secondary | ICD-10-CM

## 2024-04-15 NOTE — Assessment & Plan Note (Signed)
 Heart rate is in control, off Metoprolol , on Amiodarone , followed by Cardiology, on  Eliquis .

## 2024-04-15 NOTE — Progress Notes (Signed)
 Remote PPM Transmission

## 2024-04-15 NOTE — Assessment & Plan Note (Addendum)
 No behavioral issues, tolerated Namenda  well, helped with her mood as well. MMSE 27/30 04/09/23, CT head 02/12/22 Mild age-related atrophy and chronic microvascular ischemic changes. 03/11/24 MOST comfort measures, ABT if indicated, IVF/feeding tube for a defined trial period

## 2024-04-15 NOTE — Assessment & Plan Note (Signed)
Her mood is stable, continue Lexapro.  

## 2024-04-15 NOTE — Progress Notes (Unsigned)
 Location: [facility] Room Number: 15 Place of Service:  SNF (31) Provider: Larwance Nickoli Bagheri NP  [name], MD  Patient Care Team: [name], MD as PCP - General (Internal Medicine) [name], MD as PCP - Electrophysiology (Cardiology) Loni Soyla LABOR, MD as PCP - Cardiology (Cardiology) [name], FNP [facility] and Palliative Medicine)  Extended Emergency Contact Information Primary Emergency Contact: [facility] Phone: [phone] Mobile Phone: [phone] Relation: Daughter Interpreter needed? No Secondary Emergency Contact: Pearman,Karen Address: [address]          [place], [place] [address] [place] Home Phone: [phone] Mobile Phone: [phone] Relation: Daughter  Code Status:  DNR Goals of care: Advanced Directive information    03/26/2024    9:50 AM  Advanced Directives  Does Patient Have a Medical Advance Directive? Yes  Type of Estate Agent of [place];Out of facility DNR (pink MOST or yellow form)  Does patient want to make changes to medical advance directive? No - Patient declined  Copy of Healthcare Power of Attorney in Chart? Yes - validated most recent copy scanned in chart (See row information)     Chief Complaint  Patient presents with  . Medical Management of Chronic Issues    HPI:  Pt is a 89 y.o. female seen today for medical management of chronic diseases.     Edema at her baseline, no increased respiratory symptoms, no O2 desaturation, weight fluctuating, at baseline as well.               Depression scored 2 on recent PHQ-9.  Tolerating Lexapro well, Na 140 03/12/2024              Hospitalized 11/06/23-11/12/23 for closed displaced intertrochanteric fx of R femur, s/p IM, WBAT. Hospital stay was complicated with acute blood loss anemia and acute respiratory failure with hypoxia.                BRBPR 05/23/23, FOBT positive 05/24/23, noted  hemorrhoids, placed PPI, Hgb 12.8 12/10/23              01/11/23 family expressed concerning of hand and fingers tingling and numbness, desires checking TSH 4.38 01/26/23 If Namenda  is the culprit. The patient stated her tingling and numbness in hands and fingers has been the same, declined workup or neurology referral.              R heel, pressure ulcer, improving, scab over             Chronic numbness of R+L hands, has been using fingerless gloves which helped, declined workup, neurology referral, or medication             Anemia, post op, off Iron , Hgb 12.8 12/10/23             Senile dementia, tolerated Namenda  well, helped with her mood as well. MMSE 27/30 04/09/23, CT head 02/12/22 Mild age-related atrophy and chronic microvascular ischemic changes.             Afib, off Metoprolol , on Amiodarone , followed by Cardiology, on  Eliquis .             AS moderate             HTN off Metoprolol  05/27/23 2/2 low Bp, on Furosemide ,  f/u Cardiology. Bun/creat 22/0.7 03/12/2024             Pacemaker, SSS,  f/u Cardiology  CHF, AS, edema BLE, on Furosemide . EF 70-75% 09/19/21, 03/27/22 Venous US , negative DVT. 03/30/22 arterial US  mild to moderate obstructive disease             CKD Bun/creat 22/0.7 03/12/2024             Dysphagia, nectar liquid.              Hypothyroidism, increased Levothyroxine  1135mcg/112mcg 02/14/24 TSH 4.206 11/07/23<<12.82 02/18/24, TSH 6 weeks.              OA, takes Tylenol , ambulates with walker prior to fx of R hip 11/06/23, w/c for mobility now.              GERD, stable, on Pantoprazole.  Hgb 12.5 03/10/2024             Constipation, taking Senokot S, MiraLax   Past Medical History:  Diagnosis Date  . Aortic stenosis 03/08/2016  . Brady-tachy syndrome (HCC)    a. Biotronik dual chamber PPM implanted 2009 b. gen change to STJ dual chamber PPM 2017  . Ejection fraction    EF 70%, echo, October, 2009  //   EF 55-60%, septal dyssynergy consistent with a paced rhythm, mild to  moderate mitral regurgitation, echo, November, 2015   . GERD (gastroesophageal reflux disease) 04/28/2014   Episodes November, 2015 with fluid refluxing from her esophagus.   . Hair loss    Patient questioned Coumadin, changed toPradaxa  . Hypertension   . Hypothyroidism   . Lower extremity edema 01/22/2018  . Mitral regurgitation 05/03/2014   Mild-to-moderate mitral regurgitation, echo, November, 2015   . Osteoarthritis of left knee 10/25/2017  . Paroxysmal atrial fibrillation (HCC)    Episodes rapid atrial fib noted by pacemaker interrogation, August, 2011, diltiazem  added, patient improved. Patient continues on Rythmol   //   Changed to flecainide  2013  //   flecainide  level checked in 2013, good level   . Persistent atrial fibrillation (HCC)   . Presence of permanent cardiac pacemaker    Past Surgical History:  Procedure Laterality Date  . ABDOMINAL HYSTERECTOMY    . EP IMPLANTABLE DEVICE N/A 08/08/2015   Generator change with SJM Assurity DR PPM by Dr Kelsie  . FEMUR IM NAIL Right 11/07/2023   Procedure: INSERTION, INTRAMEDULLARY ROD, FEMUR, RETROGRADE;  Surgeon: Genelle Standing, MD;  Location: [facility] OR;  Service: Orthopedics;  Laterality: Right;  . JOINT REPLACEMENT    . PACEMAKER PLACEMENT  2009        Allergies  Allergen Reactions  . Morphine And Codeine Other (See Comments)    Patient states that she went crazy with this  . Penicillins Swelling and Rash    Has patient had a PCN reaction causing immediate rash, facial/tongue/throat swelling, SOB or lightheadedness with hypotension: {Yes/No:30480221} Has patient had a PCN reaction causing severe rash involving mucus membranes or skin necrosis: {Yes/No:30480221} Has patient had a PCN reaction that required hospitalization {Yes/No:30480221} Has patient had a PCN reaction occurring within the last 10 years: {Yes/No:30480221} If all of the above answers are NO, then may proceed with Cephalosporin use.  . Aspirin      Pacemaker   . Clindamycin/Lincomycin Other (See Comments)    Any meds with mycin in the name Unknown reaction Not listed on the [facility]  . Crestor  [Rosuvastatin ]     Other reaction(s): muscle pain Not listed on the [facility]  . Diltiazem      Skin discoloration, lower extremity swelling Not listed on the [facility]  . Erythromycin Other (  See Comments)    Any meds with mycin in the name Unknown reaction Not listed on the [facility]  . Lisinopril Other (See Comments)    REACTION: Cough  . Metronidazole     Other reaction(s): mouth sores, tongue swelling  . Morphine   . Penicillins Cross Reactors Other (See Comments)    Any meds with mycin in the name Unknown reaction    Allergies as of 04/15/2024       Reactions   Morphine And Codeine Other (See Comments)   Patient states that she went crazy with this   Penicillins Swelling, Rash   Has patient had a PCN reaction causing immediate rash, facial/tongue/throat swelling, SOB or lightheadedness with hypotension: {Yes/No:30480221} Has patient had a PCN reaction causing severe rash involving mucus membranes or skin necrosis: {Yes/No:30480221} Has patient had a PCN reaction that required hospitalization {Yes/No:30480221} Has patient had a PCN reaction occurring within the last 10 years: {Yes/No:30480221} If all of the above answers are NO, then may proceed with Cephalosporin use.   Aspirin     Pacemaker   Clindamycin/lincomycin Other (See Comments)   Any meds with mycin in the name Unknown reaction Not listed on the [facility]   Crestor  [rosuvastatin ]    Other reaction(s): muscle pain Not listed on the [facility]   Diltiazem     Skin discoloration, lower extremity swelling Not listed on the [facility]   Erythromycin Other (See Comments)   Any meds with mycin in the name Unknown reaction Not listed on the [facility]   Lisinopril Other (See Comments)   REACTION: Cough   Metronidazole    Other reaction(s): mouth sores, tongue swelling   Morphine    Penicillins Cross Reactors Other (See  Comments)   Any meds with mycin in the name Unknown reaction        Medication List        Accurate as of April 15, 2024  4:45 PM. If you have any questions, ask your nurse or doctor.          acetaminophen  325 MG tablet Commonly known as: TYLENOL  Take 650 mg by mouth every 4 (four) hours as needed for mild pain (pain score 1-3) (or fever).   albuterol  (2.5 MG/3ML) 0.083% nebulizer solution Commonly known as: PROVENTIL  Take 3 mLs (2.5 mg total) by nebulization every 6 (six) hours as needed for wheezing or shortness of breath.   amiodarone  200 MG tablet Commonly known as: PACERONE  Take 200 mg by mouth daily.   apixaban  5 MG Tabs tablet Commonly known as: Eliquis  Take 1 tablet (5 mg total) by mouth 2 (two) times daily.   betamethasone  (augmented) 0.05 % gel Commonly known as: DIPROLENE  Apply 1 application  topically every 12 (twelve) hours as needed.   bisacodyl  10 MG suppository Commonly known as: DULCOLAX Place 1 suppository (10 mg total) rectally daily as needed for moderate constipation.   Calcium  600+D Plus Minerals 600-400 MG-UNIT Tabs Take 1 tablet by mouth daily.   Combigan  0.2-0.5 % ophthalmic solution Generic drug: brimonidine -timolol  Place 1 drop into the right eye every 12 (twelve) hours.   escitalopram 5 MG tablet Commonly known as: LEXAPRO Take 5 mg by mouth daily.   furosemide  20 MG tablet Commonly known as: LASIX  TAKE ONE TABLET BY MOUTH EVERY DAY   HM Lidocaine  Patch 4 % Generic drug: lidocaine  Place 1 patch onto the skin in the morning. Apply topically to L shoulder in the morning. Apply for 12 hours (0900) then removed for 12 hours (2100).  Ipratropium-Albuterol  20-100 MCG/ACT Aers respimat Commonly known as: COMBIVENT Inhale 1 puff into the lungs every 6 (six) hours as needed for shortness of breath.   Iron  (Ferrous Sulfate ) 325 (65 Fe) MG Tabs Take 325 mg by mouth daily.   lactose free nutrition Liqd Take 237 mLs by mouth 2  (two) times daily between meals.   levothyroxine  137 MCG tablet Commonly known as: SYNTHROID  Take 137 mcg by mouth daily before breakfast.   melatonin 5 MG Tabs Take 1 tablet (5 mg total) by mouth at bedtime.   Multivitamin Adult Tabs Take 1 tablet by mouth in the morning.   Namenda  10 MG tablet Generic drug: memantine  TAKE ONE TABLET BY MOUTH TWICE A DAY FOR COGNITIVE DECLINE   nystatin cream Commonly known as: MYCOSTATIN Apply 1 Application topically every 12 (twelve) hours as needed (for rash).   OCUVITE PO Take 1 capsule by mouth in the morning.   ondansetron  4 MG tablet Commonly known as: ZOFRAN  Take 1 tablet (4 mg total) by mouth every 6 (six) hours as needed for nausea.   oxyCODONE  5 MG immediate release tablet Commonly known as: Roxicodone  Take 1 tablet (5 mg total) by mouth every 6 (six) hours as needed for severe pain (pain score 7-10).   pantoprazole 40 MG tablet Commonly known as: PROTONIX Take 40 mg by mouth daily as needed (for indigestion).   polyethylene glycol 17 g packet Commonly known as: MIRALAX  / GLYCOLAX  Take 17 g by mouth every other day.   potassium chloride  10 MEQ tablet Commonly known as: KLOR-CON  M Take 1 tablet (10 mEq total) by mouth daily.   Prevagen 10 MG Caps Generic drug: Apoaequorin Take 10 mg by mouth daily.   senna-docusate 8.6-50 MG tablet Commonly known as: Senokot-S Take 1 tablet by mouth at bedtime as needed for mild constipation.   sennosides-docusate sodium  8.6-50 MG tablet Commonly known as: SENOKOT-S Take 1 tablet by mouth daily.   Systane Hydration PF 0.4-0.3 % Soln Generic drug: Polyethyl Glyc-Propyl Glyc PF Place 1 drop into the right eye in the morning, at noon, in the evening, and at bedtime.   Voltaren  1 % Gel Generic drug: diclofenac  Sodium Apply 1 g topically 2 (two) times daily as needed (to L foot bunion deformity for pain).        Review of Systems  Constitutional:  Negative for appetite change,  fatigue and fever.  HENT:  Positive for hearing loss and trouble swallowing. Negative for congestion.   Eyes:  Negative for visual disturbance.  Respiratory:  Negative for cough, chest tightness and shortness of breath.   Cardiovascular:  Positive for leg swelling. Negative for chest pain and palpitations.  Gastrointestinal:  Negative for abdominal pain and constipation.  Genitourinary:  Negative for dysuria, frequency, hematuria and urgency.  Musculoskeletal:  Positive for arthralgias and gait problem.       Knees, L>R, intermittent in nature.  Left shoulder pain with ROM  Skin:  Positive for wound.       Chronic venous insufficiency skin changes: mild erythema in mid of R+L Right heel pressure ulcer wound   Neurological:  Positive for numbness. Negative for tremors, speech difficulty, weakness and headaches.       Memory lapses. chronic numbness in fingers.   Psychiatric/Behavioral:  Negative for confusion and sleep disturbance. The patient is not nervous/anxious.        May be less smiling than usual upon my examination today?    Immunization History  Administered Date(s) Administered  .  Fluad Quad(high Dose 65+) 04/02/2022  . Fluzone Influenza virus vaccine,trivalent (IIV3), split virus 02/24/2019, 03/08/2020, 03/25/2021  . INFLUENZA, HIGH DOSE SEASONAL PF 03/08/2015, 04/03/2017, 03/17/2018, 03/08/2020, 04/15/2023  . Influenza Split 03/11/2009, 03/11/2010, 02/25/2011  . Influenza,inj,Quad PF,6+ Mos 03/08/2016  . Influenza,inj,quad, With Preservative 03/11/2017  . Influenza-Unspecified 03/17/2024  . Moderna Covid-19 Vaccine  Bivalent Booster 53yrs & up 10/27/2021, 07/11/2023  . Moderna SARS-COV2 Booster Vaccination 11/08/2020  . Moderna Sars-Covid-2 Vaccination 06/15/2019, 07/13/2019, 04/19/2020, 02/27/2022  . PNEUMOCOCCAL CONJUGATE-20 06/27/2023  . Pfizer Covid-19 Vaccine Bivalent Booster 7yrs & up 02/28/2021, 04/12/2022  . Pneumococcal Conjugate-13 12/06/2014  . Pneumococcal  Polysaccharide-23 05/13/2001, 03/11/2009  . Polio, Unspecified 02/02/1958  . Tdap 11/21/2011, 04/11/2023  . Zoster Recombinant(Shingrix) 07/18/2022, 10/19/2022   Pertinent  Health Maintenance Due  Topic Date Due  . Influenza Vaccine  Completed  . DEXA SCAN  Completed      02/16/2022    9:45 AM 02/27/2022   11:41 AM 07/06/2022   10:04 AM 07/13/2022    3:55 PM 03/25/2023    4:51 PM  Fall Risk  Falls in the past year?   0 0 1  Was there an injury with Fall?   0 0 0  Fall Risk Category Calculator   0 0 1  (RETIRED) Patient Fall Risk Level High fall risk  High fall risk      Patient at Risk for Falls Due to  History of fall(s) No Fall Risks No Fall Risks History of fall(s);Impaired balance/gait;Impaired mobility  Fall risk Follow up  Falls evaluation completed   Falls evaluation completed Falls evaluation completed;Falls prevention discussed;Education provided     Data saved with a previous flowsheet row definition   Functional Status Survey:    Vitals:   04/15/24 1636  BP: 135/80  Pulse: 82  Resp: 17  Temp: 97.7 F (36.5 C)  SpO2: 92%  Weight: 182 lb 3.2 oz (82.6 kg)   Body mass index is 32.28 kg/m. Physical Exam Vitals and nursing note reviewed.  Constitutional:      Appearance: Normal appearance.  HENT:     Head: Normocephalic and atraumatic.     Nose: Nose normal.     Mouth/Throat:     Mouth: Mucous membranes are moist.  Eyes:     Extraocular Movements: Extraocular movements intact.     Conjunctiva/sclera: Conjunctivae normal.     Pupils: Pupils are equal, round, and reactive to light.  Cardiovascular:     Rate and Rhythm: Normal rate. Rhythm irregular.     Heart sounds: Murmur heard.     Comments: No dorsalis pedis pulses felt from previous examination.  Pulmonary:     Effort: Pulmonary effort is normal.     Breath sounds: No rales.  Abdominal:     General: Bowel sounds are normal.     Palpations: Abdomen is soft.     Tenderness: There is no abdominal  tenderness.  Genitourinary:    Comments: External hemorrhoids from previous examination Musculoskeletal:        General: Tenderness present. Normal range of motion.     Cervical back: Normal range of motion and neck supple.     Right lower leg: Edema present.     Left lower leg: Edema present.     Comments: Trace edema LLE, 1+ edema RLE Left shoulder pain with ROM R hip pain  Skin:    General: Skin is warm and dry.     Comments: Mild dark erythema mid of R+L lower legs, chronic.  R hip surgical scar Left heel healing pressure ulcer with scab over in a small area The left great toe ingrown toenail, dry blood at tip of toe, no redness, warmth, swelling noted.   Neurological:     General: No focal deficit present.     Mental Status: She is alert and oriented to person, place, and time. Mental status is at baseline.     Gait: Gait abnormal.     Comments: 5/5 muscle strength in fingers.   Psychiatric:        Mood and Affect: Mood normal.        Behavior: Behavior normal.     Comments: Seems less smile than usual upon my examination today.     Labs reviewed: Recent Labs    11/10/23 1820 11/11/23 0505 11/12/23 0608 11/19/23 0000 12/10/23 0000  NA 141 139 138 136* 138  K 3.5 3.4* 4.9 4.0  --   CL 99 101 102 102 102  CO2 30 31 31  28* 26*  GLUCOSE 123* 121* 105*  --   --   BUN 26* 27* 20 18 16   CREATININE 0.84 0.68 0.70 0.8 0.8  CALCIUM  8.4* 8.0* 8.1* 8.3* 8.9  MG 2.1 2.1 2.2  --   --   PHOS 2.0* 2.6 2.4*  --   --    Recent Labs    11/10/23 1820 11/11/23 0505 11/12/23 0608 11/19/23 0000 01/07/24 0000  AST 31 30 32 16 25  ALT 17 16 18 9 14   ALKPHOS 39 36* 38 92 102  BILITOT 0.8 1.3* 1.7*  --   --   PROT 5.5* 5.1* 5.2*  --   --   ALBUMIN 2.7* 2.6* 2.6* 3.1* 3.7   Recent Labs    11/11/23 0505 11/12/23 0608 11/19/23 0000 11/22/23 1039 12/10/23 0000  WBC 6.9 6.8 6.3 7.3 5.4  NEUTROABS 4.1 3.9  --   --  3,289.00  HGB 7.9* 7.6* 10.1* 11.4 12.8  HCT 24.7* 23.8*  32* 36.2 41  MCV 105.6* 105.3*  --  105*  --   PLT 126* 142* 202 240 143*   Lab Results  Component Value Date   TSH 12.82 (A) 02/18/2024   Lab Results  Component Value Date   HGBA1C 4.8 11/08/2023   Lab Results  Component Value Date   CHOL 159 09/14/2023   HDL 42 09/14/2023   LDLCALC 100 (H) 09/14/2023   TRIG 85 09/14/2023   CHOLHDL 3.8 09/14/2023    Significant Diagnostic Results in last 30 days:  CUP PACEART REMOTE DEVICE CHECK Result Date: 04/11/2024 PPM Scheduled remote reviewed. Normal device function.  Presenting rhythm: AP-VS. 26 AHR detections, longest lasting 6 min 44 sec, EGMs consistent with AFL. AF burden <1%. History of AF, on Eliquis  per Epic. Next remote transmission per protocol. - CS, CVRS   Assessment/Plan  Essential hypertension, benign Blood pressure is controlled, off Metoprolol  05/27/23 2/2 low Bp, on Furosemide ,  f/u Cardiology. Bun/creat 22/0.7 03/12/2024  Sinus node dysfunction (HCC) Pacemaker, SSS,  f/u Cardiology  Congestive heart failure (HCC)  AS, edema BLE, on Furosemide . EF 70-75% 09/19/21, 03/27/22 Venous US , negative DVT. 03/30/22 arterial US  mild to moderate obstructive disease  Hypothyroidism  increased Levothyroxine  1145mcg/112mcg 02/14/24 TSH 4.206 11/07/23<<12.82 02/18/24, TSH 6 weeks.   Osteoarthritis, multiple sites takes Tylenol , ambulates with walker prior to fx of R hip 11/06/23, w/c for mobility now.   GERD (gastroesophageal reflux disease) stable, on Pantoprazole.  Hgb 12.5 03/10/2024  Slow transit constipation Stable, taking  Senokot S, MiraLax   Persistent atrial fibrillation (HCC) Heart rate is in control, off Metoprolol , on Amiodarone , followed by Cardiology, on  Eliquis .  Major neurocognitive disorder (HCC) No behavioral issues, tolerated Namenda  well, helped with her mood as well. MMSE 27/30 04/09/23, CT head 02/12/22 Mild age-related atrophy and chronic microvascular ischemic changes. 03/11/24 MOST comfort measures, ABT if  indicated, IVF/feeding tube for a defined trial period   Family/ staff Communication: Plan of care reviewed with the patient and charge nurse  Labs/tests ordered: None

## 2024-04-15 NOTE — Assessment & Plan Note (Signed)
 stable, on Pantoprazole.  Hgb 12.5 03/10/2024

## 2024-04-15 NOTE — Assessment & Plan Note (Signed)
 Pacemaker, SSS,  f/u Cardiology

## 2024-04-15 NOTE — Assessment & Plan Note (Signed)
 Stable, taking Senokot S, MiraLax 

## 2024-04-15 NOTE — Assessment & Plan Note (Signed)
 Blood pressure is controlled, off Metoprolol  05/27/23 2/2 low Bp, on Furosemide ,  f/u Cardiology. Bun/creat 22/0.7 03/12/2024

## 2024-04-15 NOTE — Assessment & Plan Note (Signed)
 takes Tylenol , ambulates with walker prior to fx of R hip 11/06/23, w/c for mobility now.

## 2024-04-15 NOTE — Assessment & Plan Note (Signed)
 increased Levothyroxine  1119mcg/112mcg 02/14/24 TSH 4.206 11/07/23<<12.82 02/18/24, TSH 6 weeks.

## 2024-04-15 NOTE — Assessment & Plan Note (Signed)
 AS, edema BLE, on Furosemide . EF 70-75% 09/19/21, 03/27/22 Venous US , negative DVT. 03/30/22 arterial US  mild to moderate obstructive disease

## 2024-04-28 ENCOUNTER — Encounter: Payer: Self-pay | Admitting: Nurse Practitioner

## 2024-04-28 ENCOUNTER — Non-Acute Institutional Stay (SKILLED_NURSING_FACILITY): Payer: Self-pay | Admitting: Nurse Practitioner

## 2024-04-28 DIAGNOSIS — F339 Major depressive disorder, recurrent, unspecified: Secondary | ICD-10-CM

## 2024-04-28 DIAGNOSIS — F0390 Unspecified dementia without behavioral disturbance: Secondary | ICD-10-CM | POA: Diagnosis not present

## 2024-04-28 DIAGNOSIS — E039 Hypothyroidism, unspecified: Secondary | ICD-10-CM

## 2024-04-28 DIAGNOSIS — M150 Primary generalized (osteo)arthritis: Secondary | ICD-10-CM

## 2024-04-28 DIAGNOSIS — I4819 Other persistent atrial fibrillation: Secondary | ICD-10-CM

## 2024-04-28 DIAGNOSIS — I495 Sick sinus syndrome: Secondary | ICD-10-CM

## 2024-04-28 DIAGNOSIS — R600 Localized edema: Secondary | ICD-10-CM | POA: Diagnosis not present

## 2024-04-28 DIAGNOSIS — I10 Essential (primary) hypertension: Secondary | ICD-10-CM

## 2024-04-28 DIAGNOSIS — H109 Unspecified conjunctivitis: Secondary | ICD-10-CM | POA: Insufficient documentation

## 2024-04-28 NOTE — Assessment & Plan Note (Signed)
 Blood pressure is controlled, off Metoprolol  05/27/23 2/2 low Bp, on Furosemide ,  f/u Cardiology. Bun/creat 22/0.7 03/12/2024

## 2024-04-28 NOTE — Assessment & Plan Note (Signed)
 Naphcon A 1 gtt OU twice daily for 5 days, may repeat as needed

## 2024-04-28 NOTE — Assessment & Plan Note (Signed)
 increased Levothyroxine  1137mcg every day, TSH 4.206 11/07/23<<12.82 02/18/24>>9.05 03/31/24,  TSH 05/05/24

## 2024-04-28 NOTE — Assessment & Plan Note (Signed)
 takes Tylenol , ambulates with walker prior to fx of R hip 11/06/23, w/c for mobility now.

## 2024-04-28 NOTE — Assessment & Plan Note (Signed)
Her mood is stable, continue Lexapro.  

## 2024-04-28 NOTE — Assessment & Plan Note (Signed)
 tolerated Namenda well, helped with her mood as well. MMSE 27/30 04/09/23, CT head 02/12/22 Mild age-related atrophy and chronic microvascular ischemic changes.

## 2024-04-28 NOTE — Assessment & Plan Note (Signed)
 Edema at her baseline, no increased respiratory symptoms, no O2 desaturation, weight fluctuating, at baseline as well.  Continue furosemide 

## 2024-04-28 NOTE — Assessment & Plan Note (Signed)
 Heart rate is in control, off Metoprolol , on Amiodarone , followed by Cardiology, on  Eliquis .

## 2024-04-28 NOTE — Progress Notes (Signed)
 Location:  [facility] Room Number: 15 A Place of Service:  SNF (31) Provider: Josearmando Kuhnert X, NP   Patient Care Team: [name], MD as PCP - General (Internal Medicine) [name], MD as PCP - Electrophysiology (Cardiology) Loni Soyla LABOR, MD as PCP - Cardiology (Cardiology) [name], FNP [facility])  Extended Emergency Contact Information Primary Emergency Contact: [facility] Phone: [phone] Mobile Phone: [phone] Relation: Daughter Interpreter needed? No Secondary Emergency Contact: Pearman,Karen Address: [address]          [place], [place] [address] [place] Home Phone: [phone] Mobile Phone: [phone] Relation: Daughter  Code Status:  DNR Goals of care: Advanced Directive information    03/26/2024    9:50 AM  Advanced Directives  Does Patient Have a Medical Advance Directive? Yes  Type of Estate Agent of [place];Out of facility DNR (pink MOST or yellow form)  Does patient want to make changes to medical advance directive? No - Patient declined  Copy of Healthcare Power of Attorney in Chart? Yes - validated most recent copy scanned in chart (See row information)     Chief Complaint  Patient presents with   Acute Visit    Itching eyes    HPI:  Pt is a 89 y.o. female seen today for an acute visit for reported itchy, watery, red eyes, currently using artificial tears, denied change of vision or pain in the eyes.  Glaucoma, right eye, Combigan  ophthalmic solution  . Edema at her baseline, no increased respiratory symptoms, no O2 desaturation, weight fluctuating, at baseline as well.               Depression scored 2 on recent PHQ-9.  Tolerating Lexapro well, Na 140 03/12/2024              Hospitalized 11/06/23-11/12/23 for closed displaced intertrochanteric fx of R femur, s/p IM, WBAT. Hospital stay was complicated with acute blood loss  anemia and acute respiratory failure with hypoxia.                BRBPR 05/23/23, FOBT positive 05/24/23, noted hemorrhoids, placed PPI, Hgb 12.8 12/10/23              01/11/23 family expressed concerning of hand and fingers tingling and numbness, desires checking TSH 4.38 01/26/23 If Namenda  is the culprit. The patient stated her tingling and numbness in hands and fingers has been the same, declined workup or neurology referral.              R heel, pressure ulcer, improving, scab over             Chronic numbness of R+L hands, has been using fingerless gloves which helped, declined workup, neurology referral, or medication             Anemia, post op, off Iron , Hgb 12.8 12/10/23             Senile dementia, tolerated Namenda  well, helped with her mood as well. MMSE 27/30 04/09/23, CT head 02/12/22 Mild age-related atrophy and chronic microvascular ischemic changes.             Afib, off Metoprolol , on Amiodarone , followed by Cardiology, on  Eliquis .             AS moderate             HTN off Metoprolol  05/27/23 2/2 low Bp, on Furosemide ,  f/u Cardiology. Bun/creat 22/0.7  03/12/2024             Pacemaker, SSS,  f/u Cardiology             CHF, AS, edema BLE, on Furosemide . EF 70-75% 09/19/21, 03/27/22 Venous US , negative DVT. 03/30/22 arterial US  mild to moderate obstructive disease             CKD Bun/creat 22/0.7 03/12/2024             Dysphagia, nectar liquid.              Hypothyroidism, increased Levothyroxine  every day, TSH 4.206 11/07/23<<12.82 02/18/24>>9.05 03/31/24,  TSH 05/05/24             OA, takes Tylenol , ambulates with walker prior to fx of R hip 11/06/23, w/c for mobility now.              GERD, stable, on Pantoprazole.  Hgb 12.5 03/10/2024             Constipation, taking Senokot S, MiraLax      Past Medical History:  Diagnosis Date   Aortic stenosis 03/08/2016   Brady-tachy syndrome (HCC)    a. Biotronik dual chamber PPM implanted 2009 b. gen change to STJ dual chamber PPM 2017    Ejection fraction    EF 70%, echo, October, 2009  //   EF 55-60%, septal dyssynergy consistent with a paced rhythm, mild to moderate mitral regurgitation, echo, November, 2015    GERD (gastroesophageal reflux disease) 04/28/2014   Episodes November, 2015 with fluid refluxing from her esophagus.    Hair loss    Patient questioned Coumadin, changed toPradaxa   Hypertension    Hypothyroidism    Lower extremity edema 01/22/2018   Mitral regurgitation 05/03/2014   Mild-to-moderate mitral regurgitation, echo, November, 2015    Osteoarthritis of left knee 10/25/2017   Paroxysmal atrial fibrillation (HCC)    Episodes rapid atrial fib noted by pacemaker interrogation, August, 2011, diltiazem  added, patient improved. Patient continues on Rythmol   //   Changed to flecainide  2013  //   flecainide  level checked in 2013, good level    Persistent atrial fibrillation (HCC)    Presence of permanent cardiac pacemaker    Past Surgical History:  Procedure Laterality Date   ABDOMINAL HYSTERECTOMY     EP IMPLANTABLE DEVICE N/A 08/08/2015   Generator change with SJM Assurity DR PPM by Dr Kelsie   FEMUR IM NAIL Right 11/07/2023   Procedure: INSERTION, INTRAMEDULLARY ROD, FEMUR, RETROGRADE;  Surgeon: Genelle Standing, MD;  Location: [facility] OR;  Service: Orthopedics;  Laterality: Right;   JOINT REPLACEMENT     PACEMAKER PLACEMENT  2009        Outpatient Encounter Medications as of 04/28/2024  Medication Sig   acetaminophen  (TYLENOL ) 325 MG tablet Take 650 mg by mouth every 4 (four) hours as needed for mild pain (pain score 1-3) (or fever).   albuterol  (PROVENTIL ) (2.5 MG/3ML) 0.083% nebulizer solution Take 3 mLs (2.5 mg total) by nebulization every 6 (six) hours as needed for wheezing or shortness of breath.   amiodarone  (PACERONE ) 200 MG tablet Take 200 mg by mouth daily.   apixaban  (ELIQUIS ) 5 MG TABS tablet Take 1 tablet (5 mg total) by mouth 2 (two) times daily.   Apoaequorin (PREVAGEN) 10 MG CAPS Take 10  mg by mouth daily.   betamethasone , augmented, (DIPROLENE ) 0.05 % gel Apply 1 application  topically every 12 (twelve) hours as needed.   bisacodyl  (DULCOLAX) 10 MG suppository Place  1 suppository (10 mg total) rectally daily as needed for moderate constipation.   brimonidine -timolol  (COMBIGAN ) 0.2-0.5 % ophthalmic solution Place 1 drop into the right eye every 12 (twelve) hours.   Calcium  Carbonate-Vit D-Min (CALCIUM  600+D PLUS MINERALS) 600-400 MG-UNIT TABS Take 1 tablet by mouth daily.   diclofenac  Sodium (VOLTAREN ) 1 % GEL Apply 1 g topically 2 (two) times daily as needed (to L foot bunion deformity for pain).   escitalopram (LEXAPRO) 5 MG tablet Take 5 mg by mouth daily.   furosemide  (LASIX ) 20 MG tablet TAKE ONE TABLET BY MOUTH EVERY DAY   Ipratropium-Albuterol  (COMBIVENT) 20-100 MCG/ACT AERS respimat Inhale 1 puff into the lungs every 6 (six) hours as needed for shortness of breath.   lactose free nutrition (BOOST) LIQD Take 237 mLs by mouth 2 (two) times daily between meals.   levothyroxine  (SYNTHROID ) 137 MCG tablet Take 137 mcg by mouth daily before breakfast.   lidocaine  (HM LIDOCAINE  PATCH) 4 % Place 1 patch onto the skin in the morning. Apply topically to L shoulder in the morning. Apply for 12 hours (0900) then removed for 12 hours (2100).   loperamide (IMODIUM A-D) 2 MG tablet Take 2 mg by mouth 3 (three) times daily as needed for diarrhea or loose stools.   melatonin 5 MG TABS Take 1 tablet (5 mg total) by mouth at bedtime.   Multiple Vitamin (MULTIVITAMIN ADULT) TABS Take 1 tablet by mouth in the morning.   Multiple Vitamins-Minerals (OCUVITE PO) Take 1 capsule by mouth in the morning.   NAMENDA  10 MG tablet TAKE ONE TABLET BY MOUTH TWICE A DAY FOR COGNITIVE DECLINE   nystatin cream (MYCOSTATIN) Apply 1 Application topically every 12 (twelve) hours as needed (for rash).   ondansetron  (ZOFRAN ) 4 MG tablet Take 1 tablet (4 mg total) by mouth every 6 (six) hours as needed for nausea.    ondansetron  (ZOFRAN ) 4 MG tablet Take 4 mg by mouth every 8 (eight) hours as needed for nausea or vomiting.   oxyCODONE  (ROXICODONE ) 5 MG immediate release tablet Take 1 tablet (5 mg total) by mouth every 6 (six) hours as needed for severe pain (pain score 7-10).   pantoprazole (PROTONIX) 40 MG tablet Take 40 mg by mouth daily as needed (for indigestion).   Polyethyl Glyc-Propyl Glyc PF (SYSTANE HYDRATION PF) 0.4-0.3 % SOLN Place 1 drop into the right eye in the morning, at noon, in the evening, and at bedtime.   polyethylene glycol (MIRALAX  / GLYCOLAX ) 17 g packet Take 17 g by mouth every other day.   potassium chloride  (KLOR-CON  M) 10 MEQ tablet Take 1 tablet (10 mEq total) by mouth daily.   senna-docusate (SENOKOT-S) 8.6-50 MG tablet Take 1 tablet by mouth at bedtime as needed for mild constipation.   sennosides-docusate sodium  (SENOKOT-S) 8.6-50 MG tablet Take 1 tablet by mouth daily.   [DISCONTINUED] Iron , Ferrous Sulfate , 325 (65 Fe) MG TABS Take 325 mg by mouth daily. (Patient not taking: Reported on 04/28/2024)   No facility-administered encounter medications on file as of 04/28/2024.    Review of Systems  Constitutional:  Negative for appetite change, fatigue and fever.  HENT:  Positive for hearing loss and trouble swallowing. Negative for congestion.   Eyes:  Positive for discharge, redness and itching. Negative for pain and visual disturbance.  Respiratory:  Negative for cough, chest tightness and shortness of breath.   Cardiovascular:  Positive for leg swelling. Negative for chest pain and palpitations.  Gastrointestinal:  Negative for abdominal pain  and constipation.  Genitourinary:  Negative for dysuria, frequency, hematuria and urgency.  Musculoskeletal:  Positive for arthralgias and gait problem.       Knees, L>R, intermittent in nature.  Left shoulder pain with ROM  Skin:  Positive for wound.       Chronic venous insufficiency skin changes: mild erythema in mid of  R+L Right heel pressure ulcer wound   Neurological:  Positive for numbness. Negative for tremors, speech difficulty, weakness and headaches.       Memory lapses. chronic numbness in fingers.   Psychiatric/Behavioral:  Negative for confusion and sleep disturbance. The patient is not nervous/anxious.        May be less smiling than usual upon my examination today?    Immunization History  Administered Date(s) Administered   Fluad Quad(high Dose 65+) 04/02/2022   Fluzone Influenza virus vaccine,trivalent (IIV3), split virus 02/24/2019, 03/08/2020, 03/25/2021   INFLUENZA, HIGH DOSE SEASONAL PF 03/08/2015, 04/03/2017, 03/17/2018, 03/08/2020, 04/15/2023   Influenza Split 03/11/2009, 03/11/2010, 02/25/2011   Influenza,inj,Quad PF,6+ Mos 03/08/2016   Influenza,inj,quad, With Preservative 03/11/2017   Influenza-Unspecified 03/17/2024   Moderna Covid-19 Vaccine  Bivalent Booster 63yrs & up 10/27/2021, 07/11/2023   Moderna SARS-COV2 Booster Vaccination 11/08/2020   Moderna Sars-Covid-2 Vaccination 06/15/2019, 07/13/2019, 04/19/2020, 02/27/2022   PNEUMOCOCCAL CONJUGATE-20 06/27/2023   Pfizer Covid-19 Vaccine Bivalent Booster 65yrs & up 02/28/2021, 04/12/2022   Pneumococcal Conjugate-13 12/06/2014   Pneumococcal Polysaccharide-23 05/13/2001, 03/11/2009   Polio, Unspecified 02/02/1958   Tdap 11/21/2011, 04/11/2023   Unspecified SARS-COV-2 Vaccination 04/06/2024   Zoster Recombinant(Shingrix) 07/18/2022, 10/19/2022   Pertinent  Health Maintenance Due  Topic Date Due   Influenza Vaccine  Completed   DEXA SCAN  Completed      02/16/2022    9:45 AM 02/27/2022   11:41 AM 07/06/2022   10:04 AM 07/13/2022    3:55 PM 03/25/2023    4:51 PM  Fall Risk  Falls in the past year?   0 0 1  Was there an injury with Fall?   0 0 0  Fall Risk Category Calculator   0 0 1  (RETIRED) Patient Fall Risk Level High fall risk  High fall risk      Patient at Risk for Falls Due to  History of fall(s) No Fall Risks No  Fall Risks History of fall(s);Impaired balance/gait;Impaired mobility  Fall risk Follow up  Falls evaluation completed   Falls evaluation completed Falls evaluation completed;Falls prevention discussed;Education provided     Data saved with a previous flowsheet row definition   Functional Status Survey:    Vitals:   04/28/24 1143  BP: 132/76  Pulse: 80  Resp: 17  Temp: 97.7 F (36.5 C)  SpO2: 92%  Weight: 180 lb 11.2 oz (82 kg)  Height: 5' 3 (1.6 m)   Body mass index is 32.01 kg/m. Physical Exam Vitals and nursing note reviewed.  Constitutional:      Appearance: Normal appearance.  HENT:     Head: Normocephalic and atraumatic.     Nose: Nose normal.     Mouth/Throat:     Mouth: Mucous membranes are moist.  Eyes:     General:        Right eye: Discharge present.        Left eye: Discharge present.    Extraocular Movements: Extraocular movements intact.     Pupils: Pupils are equal, round, and reactive to light.     Comments: Mild injected right > left conjunctivae, no change of vision, watery, complaining  of itching and eye pain.  Cardiovascular:     Rate and Rhythm: Normal rate. Rhythm irregular.     Heart sounds: Murmur heard.     Comments: No dorsalis pedis pulses felt from previous examination.  Pulmonary:     Effort: Pulmonary effort is normal.     Breath sounds: No rales.  Abdominal:     General: Bowel sounds are normal.     Palpations: Abdomen is soft.     Tenderness: There is no abdominal tenderness.  Genitourinary:    Comments: External hemorrhoids from previous examination Musculoskeletal:        General: Tenderness present. Normal range of motion.     Cervical back: Normal range of motion and neck supple.     Right lower leg: Edema present.     Left lower leg: Edema present.     Comments: Trace edema LLE, 1+ edema RLE Left shoulder pain with ROM R hip pain  Skin:    General: Skin is warm and dry.     Comments: Mild dark erythema mid of R+L lower  legs, chronic.   R hip surgical scar Left heel healing pressure ulcer with scab over in a small area The left great toe ingrown toenail, dry blood at tip of toe, no redness, warmth, swelling noted.   Neurological:     General: No focal deficit present.     Mental Status: She is alert and oriented to person, place, and time. Mental status is at baseline.     Gait: Gait abnormal.     Comments: 5/5 muscle strength in fingers.   Psychiatric:        Mood and Affect: Mood normal.        Behavior: Behavior normal.     Comments: Seems less smile than usual upon my examination today.     Labs reviewed: Recent Labs    11/10/23 1820 11/11/23 0505 11/12/23 0608 11/19/23 0000 12/10/23 0000 03/10/24 0000 03/12/24 0000  NA 141 139 138 136* 138 139 140  K 3.5 3.4* 4.9 4.0  --  4.0 4.2  CL 99 101 102 102 102 102 102  CO2 30 31 31  28* 26* 31* 32*  GLUCOSE 123* 121* 105*  --   --   --   --   BUN 26* 27* 20 18 16  32* 22*  CREATININE 0.84 0.68 0.70 0.8 0.8 0.9 0.7  CALCIUM  8.4* 8.0* 8.1* 8.3* 8.9  --  8.8  MG 2.1 2.1 2.2  --   --   --   --   PHOS 2.0* 2.6 2.4*  --   --   --   --    Recent Labs    11/10/23 1820 11/11/23 0505 11/12/23 0608 11/19/23 0000 01/07/24 0000 03/10/24 0000  AST 31 30 32 16 25 19   ALT 17 16 18 9 14 15   ALKPHOS 39 36* 38 92 102 71  BILITOT 0.8 1.3* 1.7*  --   --   --   PROT 5.5* 5.1* 5.2*  --   --   --   ALBUMIN 2.7* 2.6* 2.6* 3.1* 3.7 3.2*   Recent Labs    11/11/23 0505 11/12/23 0608 11/19/23 0000 11/22/23 1039 12/10/23 0000 03/10/24 0000  WBC 6.9 6.8   < > 7.3 5.4 9.3  NEUTROABS 4.1 3.9  --   --  3,289.00 6,845.00  HGB 7.9* 7.6*   < > 11.4 12.8 12.5  HCT 24.7* 23.8*   < > 36.2 41 39  MCV 105.6* 105.3*  --  105*  --   --   PLT 126* 142*   < > 240 143* 97*   < > = values in this interval not displayed.   Lab Results  Component Value Date   TSH 9.05 (A) 03/31/2024   Lab Results  Component Value Date   HGBA1C 4.8 11/08/2023   Lab Results   Component Value Date   CHOL 159 09/14/2023   HDL 42 09/14/2023   LDLCALC 100 (H) 09/14/2023   TRIG 85 09/14/2023   CHOLHDL 3.8 09/14/2023    Significant Diagnostic Results in last 30 days:  CUP PACEART REMOTE DEVICE CHECK Result Date: 04/11/2024 PPM Scheduled remote reviewed. Normal device function.  Presenting rhythm: AP-VS. 26 AHR detections, longest lasting 6 min 44 sec, EGMs consistent with AFL. AF burden <1%. History of AF, on Eliquis  per Epic. Next remote transmission per protocol. - CS, CVRS   Assessment/Plan Conjunctivitis Naphcon A 1 gtt OU twice daily for 5 days, may repeat as needed  Edema leg Edema at her baseline, no increased respiratory symptoms, no O2 desaturation, weight fluctuating, at baseline as well.  Continue furosemide   Hypothyroidism increased Levothyroxine  every day, TSH 4.206 11/07/23<<12.82 02/18/24>>9.05 03/31/24,  TSH 05/05/24  Osteoarthritis, multiple sites  takes Tylenol , ambulates with walker prior to fx of R hip 11/06/23, w/c for mobility now.   Essential hypertension, benign Blood pressure is controlled, off Metoprolol  05/27/23 2/2 low Bp, on Furosemide ,  f/u Cardiology. Bun/creat 22/0.7 03/12/2024  Brady-tachy syndrome (HCC) pacemaker  Persistent atrial fibrillation (HCC) Heart rate is in control, off Metoprolol , on Amiodarone , followed by Cardiology, on  Eliquis .  Major neurocognitive disorder (HCC)  tolerated Namenda  well, helped with her mood as well. MMSE 27/30 04/09/23, CT head 02/12/22 Mild age-related atrophy and chronic microvascular ischemic changes.  Recurrent depression Her mood is stable, continue Lexapro     Family/ staff Communication: Plan of care reviewed with the patient and charge nurse  Labs/tests ordered: None

## 2024-04-28 NOTE — Assessment & Plan Note (Signed)
pacemaker 

## 2024-05-05 DIAGNOSIS — E039 Hypothyroidism, unspecified: Secondary | ICD-10-CM | POA: Diagnosis not present

## 2024-05-05 LAB — TSH: TSH: 2.13 (ref 0.41–5.90)

## 2024-05-29 ENCOUNTER — Other Ambulatory Visit: Payer: Self-pay | Admitting: Adult Health

## 2024-05-29 ENCOUNTER — Encounter: Payer: Self-pay | Admitting: Adult Health

## 2024-05-29 ENCOUNTER — Non-Acute Institutional Stay (SKILLED_NURSING_FACILITY): Payer: Self-pay | Admitting: Adult Health

## 2024-05-29 DIAGNOSIS — N390 Urinary tract infection, site not specified: Secondary | ICD-10-CM | POA: Diagnosis not present

## 2024-05-29 MED ORDER — SULFAMETHOXAZOLE-TRIMETHOPRIM 800-160 MG PO TABS: 1.0000 | ORAL_TABLET | Freq: Two times a day (BID) | ORAL | 0 refills | Status: AC

## 2024-05-29 NOTE — Progress Notes (Signed)
 "  Location:  [facility] Room Number: VOLNEY CERT [id number] Place of Service:  SNF (31) Provider:  Medina-Vargas, Sejla Marzano, DNP, FNP-BC  Patient Care Team: Charlanne Fredia CROME, MD as PCP - General (Internal Medicine) [name], FNP (Hospice and Palliative Medicine) [name], MD as Consulting Physician (Cardiology) Loni Soyla LABOR, MD as Consulting Physician (Cardiology)  Extended Emergency Contact Information Primary Emergency Contact: [facility] Phone: [phone] Mobile Phone: [phone] Relation: Daughter Interpreter needed? No Secondary Emergency Contact: Pearman,Karen Address: [address]          [place], [place] [address] [place] Home Phone: [phone] Mobile Phone: [phone] Relation: Daughter  Code Status:  DNR  Goals of care: Advanced Directive information    05/29/2024   10:34 AM  Advanced Directives  Does Patient Have a Medical Advance Directive? Yes  Type of Advance Directive Out of facility DNR (pink MOST or yellow form)  Does patient want to make changes to medical advance directive? No - Patient declined     Chief Complaint  Patient presents with   Urinary Tract Infection    HPI:  Pt is a 89 y.o. female seen today for an acute visit regarding abnormal urine culture. She is a resident of [facility]. Urine culture showed > than 100,000 CFU/mL of Proteus mirabilis.  Urinalysis showed positive nitrite, 3+ leukocyte esterase and many bacteria.   Past Medical History:  Diagnosis Date   Aortic stenosis 03/08/2016   Brady-tachy syndrome (HCC)    a. Biotronik dual chamber PPM implanted 2009 b. gen change to STJ dual chamber PPM 2017   Ejection fraction    EF 70%, echo, October, 2009  //   EF 55-60%, septal dyssynergy consistent with a paced rhythm, mild to moderate mitral regurgitation, echo, November, 2015    GERD (gastroesophageal reflux disease) 04/28/2014   Episodes  November, 2015 with fluid refluxing from her esophagus.    Hair loss    Patient questioned Coumadin, changed toPradaxa   Hypertension    Hypothyroidism    Lower extremity edema 01/22/2018   Mitral regurgitation 05/03/2014   Mild-to-moderate mitral regurgitation, echo, November, 2015    Osteoarthritis of left knee 10/25/2017   Paroxysmal atrial fibrillation (HCC)    Episodes rapid atrial fib noted by pacemaker interrogation, August, 2011, diltiazem  added, patient improved. Patient continues on Rythmol   //   Changed to flecainide  2013  //   flecainide  level checked in 2013, good level    Persistent atrial fibrillation (HCC)    Presence of permanent cardiac pacemaker    Past Surgical History:  Procedure Laterality Date   ABDOMINAL HYSTERECTOMY     EP IMPLANTABLE DEVICE N/A 08/08/2015   Generator change with SJM Assurity DR PPM by Dr Kelsie   FEMUR IM NAIL Right 11/07/2023   Procedure: INSERTION, INTRAMEDULLARY ROD, FEMUR, RETROGRADE;  Surgeon: Genelle Standing, MD;  Location: [facility] OR;  Service: Orthopedics;  Laterality: Right;   JOINT REPLACEMENT     PACEMAKER PLACEMENT  2009        Allergies  Allergen Reactions   Morphine And Codeine Other (See Comments)    Patient states that she went crazy with this   Penicillins Swelling and Rash    Has patient had a PCN reaction causing immediate rash, facial/tongue/throat swelling, SOB or lightheadedness with hypotension:  Has patient had a PCN reaction causing severe rash involving mucus membranes or skin necrosis: Has patient had a PCN reaction that required hospitalization  No Has patient had a PCN reaction occurring within the last 10 years: No If all of the above answers are NO, then may proceed with Cephalosporin use.   Aspirin      Pacemaker   Clindamycin/Lincomycin Other (See Comments)    Any meds with mycin in the name Unknown reaction Not listed on the [facility]   Crestor  [Rosuvastatin ]     Other reaction(s): muscle pain Not listed on  the [facility]   Diltiazem      Skin discoloration, lower extremity swelling Not listed on the [facility]   Erythromycin Other (See Comments)    Any meds with mycin in the name Unknown reaction Not listed on the [facility]   Lisinopril Other (See Comments)    REACTION: Cough   Metronidazole     Other reaction(s): mouth sores, tongue swelling   Morphine    Penicillins Cross Reactors Other (See Comments)    Any meds with mycin in the name Unknown reaction    Outpatient Encounter Medications as of 05/29/2024  Medication Sig   acetaminophen  (TYLENOL ) 325 MG tablet Take 650 mg by mouth every 4 (four) hours as needed for mild pain (pain score 1-3) (or fever).   acetaminophen  (TYLENOL ) 325 MG tablet Take 650 mg by mouth 2 (two) times daily. **Do not exceed 3,000mg /24 hours(scheduled + PRN doses)   amiodarone  (PACERONE ) 200 MG tablet Take 200 mg by mouth daily.   apixaban  (ELIQUIS ) 5 MG TABS tablet Take 1 tablet (5 mg total) by mouth 2 (two) times daily.   Apoaequorin (PREVAGEN) 10 MG CAPS Take 10 mg by mouth daily.   brimonidine -timolol  (COMBIGAN ) 0.2-0.5 % ophthalmic solution Place 1 drop into the right eye every 12 (twelve) hours.   Calcium  Carbonate-Vit D-Min (CALCIUM  600+D PLUS MINERALS) 600-400 MG-UNIT TABS Take 1 tablet by mouth daily.   Dextromethorphan -guaiFENesin  (MUCINEX  DM) 30-600 MG TB12 Take 1 tablet by mouth 2 (two) times daily. For constipation   escitalopram (LEXAPRO) 5 MG tablet Take 5 mg by mouth daily.   furosemide  (LASIX ) 20 MG tablet TAKE ONE TABLET BY MOUTH EVERY DAY   lactose free nutrition (BOOST) LIQD Take 237 mLs by mouth 2 (two) times daily between meals. Prefers chocolate   levothyroxine  (SYNTHROID ) 137 MCG tablet Take 137 mcg by mouth daily before breakfast.   lidocaine  (HM LIDOCAINE  PATCH) 4 % Place 1 patch onto the skin in the morning. Apply topically to L shoulder in the morning. Apply for 12 hours (0900) then removed for 12 hours (2100).   loperamide (IMODIUM A-D) 2 MG tablet  Take 2 mg by mouth 3 (three) times daily as needed for diarrhea or loose stools.   melatonin 5 MG TABS Take 1 tablet (5 mg total) by mouth at bedtime.   Multiple Vitamin (MULTIVITAMIN ADULT) TABS Take 1 tablet by mouth in the morning.   Multiple Vitamins-Minerals (OCUVITE PO) Take 1 capsule by mouth in the morning.   NAMENDA  10 MG tablet TAKE ONE TABLET BY MOUTH TWICE A DAY FOR COGNITIVE DECLINE   ondansetron  (ZOFRAN ) 4 MG tablet Take 4 mg by mouth every 8 (eight) hours as needed for nausea or vomiting.   oxyCODONE  (ROXICODONE ) 5 MG immediate release tablet Take 1 tablet (5 mg total) by mouth every 6 (six) hours as needed for severe pain (pain score 7-10).   Polyethyl Glyc-Propyl Glyc PF (SYSTANE HYDRATION PF) 0.4-0.3 % SOLN Place 1 drop into the right eye in the morning, at noon, in the evening, and at bedtime.   polyethylene glycol (MIRALAX  /  GLYCOLAX ) 17 g packet Take 17 g by mouth every other day.   potassium chloride  (KLOR-CON  M) 10 MEQ tablet Take 1 tablet (10 mEq total) by mouth daily.   sennosides-docusate sodium  (SENOKOT-S) 8.6-50 MG tablet Take 1 tablet by mouth daily.   [EXPIRED] sulfamethoxazole -trimethoprim  (BACTRIM  DS) 800-160 MG tablet Take 1 tablet by mouth 2 (two) times daily for 5 days.   [DISCONTINUED] sulfamethoxazole -trimethoprim  (BACTRIM  DS) 800-160 MG tablet Take 1 tablet by mouth once. One time only for UTI for 1 day (Patient not taking: Reported on 06/09/2024)   [DISCONTINUED] albuterol  (PROVENTIL ) (2.5 MG/3ML) 0.083% nebulizer solution Take 3 mLs (2.5 mg total) by nebulization every 6 (six) hours as needed for wheezing or shortness of breath. (Patient not taking: Reported on 06/09/2024)   [DISCONTINUED] betamethasone , augmented, (DIPROLENE ) 0.05 % gel Apply 1 application  topically every 12 (twelve) hours as needed. (Patient not taking: Reported on 06/09/2024)   [DISCONTINUED] bisacodyl  (DULCOLAX) 10 MG suppository Place 1 suppository (10 mg total) rectally daily as needed  for moderate constipation. (Patient not taking: Reported on 06/09/2024)   [DISCONTINUED] diclofenac  Sodium (VOLTAREN ) 1 % GEL Apply 1 g topically 2 (two) times daily as needed (to L foot bunion deformity for pain). (Patient not taking: Reported on 06/09/2024)   [DISCONTINUED] Ipratropium-Albuterol  (COMBIVENT) 20-100 MCG/ACT AERS respimat Inhale 1 puff into the lungs every 6 (six) hours as needed for shortness of breath. (Patient not taking: Reported on 06/09/2024)   [DISCONTINUED] nystatin cream (MYCOSTATIN) Apply 1 Application topically every 12 (twelve) hours as needed (for rash). (Patient not taking: Reported on 06/09/2024)   [DISCONTINUED] ondansetron  (ZOFRAN ) 4 MG tablet Take 1 tablet (4 mg total) by mouth every 6 (six) hours as needed for nausea. (Patient not taking: Reported on 06/09/2024)   [DISCONTINUED] pantoprazole (PROTONIX) 40 MG tablet Take 40 mg by mouth daily as needed (for indigestion). (Patient not taking: Reported on 06/09/2024)   [DISCONTINUED] senna-docusate (SENOKOT-S) 8.6-50 MG tablet Take 1 tablet by mouth at bedtime as needed for mild constipation. (Patient not taking: Reported on 06/09/2024)   No facility-administered encounter medications on file as of 05/29/2024.    Review of Systems  Constitutional:  Negative for appetite change, chills, fatigue and fever.  HENT:  Negative for congestion, hearing loss, rhinorrhea and sore throat.   Eyes: Negative.   Respiratory:  Negative for cough, shortness of breath and wheezing.   Cardiovascular:  Negative for chest pain, palpitations and leg swelling.  Gastrointestinal:  Negative for abdominal pain, constipation, diarrhea, nausea and vomiting.  Genitourinary:  Negative for dysuria.  Musculoskeletal:  Negative for arthralgias, back pain and myalgias.  Skin:  Negative for color change, rash and wound.  Neurological:  Negative for dizziness, weakness and headaches.  Psychiatric/Behavioral:  Negative for behavioral problems. The  patient is not nervous/anxious.        Immunization History  Administered Date(s) Administered   Fluad Quad(high Dose 65+) 04/02/2022   Fluzone Influenza virus vaccine,trivalent (IIV3), split virus 02/24/2019, 03/08/2020, 03/25/2021   INFLUENZA, HIGH DOSE SEASONAL PF 03/08/2015, 04/03/2017, 03/17/2018, 03/08/2020, 04/15/2023   Influenza Split 03/11/2009, 03/11/2010, 02/25/2011   Influenza,inj,Quad PF,6+ Mos 03/08/2016   Influenza,inj,quad, With Preservative 03/11/2017   Influenza-Unspecified 03/17/2024   Moderna Covid-19 Vaccine  Bivalent Booster 64yrs & up 10/27/2021, 07/11/2023   Moderna SARS-COV2 Booster Vaccination 11/08/2020   Moderna Sars-Covid-2 Vaccination 06/15/2019, 07/13/2019, 04/19/2020, 02/27/2022   PNEUMOCOCCAL CONJUGATE-20 06/27/2023   Pfizer Covid-19 Vaccine Bivalent Booster 63yrs & up 02/28/2021, 04/12/2022   Pneumococcal Conjugate-13 12/06/2014   Pneumococcal Polysaccharide-23  05/13/2001, 03/11/2009   Polio, Unspecified 02/02/1958   RSV,unspecified 03/05/2024   Tdap 11/21/2011, 04/11/2023   Unspecified SARS-COV-2 Vaccination 04/06/2024   Zoster Recombinant(Shingrix) 07/18/2022, 10/19/2022   Pertinent  Health Maintenance Due  Topic Date Due   Influenza Vaccine  Completed   Bone Density Scan  Completed      02/16/2022    9:45 AM 02/27/2022   11:41 AM 07/06/2022   10:04 AM 07/13/2022    3:55 PM 03/25/2023    4:51 PM  Fall Risk  Falls in the past year?   0 0 1  Was there an injury with Fall?   0  0  0   Fall Risk Category Calculator   0 0 1  (RETIRED) Patient Fall Risk Level High fall risk  High fall risk      Patient at Risk for Falls Due to  History of fall(s) No Fall Risks No Fall Risks History of fall(s);Impaired balance/gait;Impaired mobility  Fall risk Follow up  Falls evaluation completed   Falls evaluation completed Falls evaluation completed;Falls prevention discussed;Education provided     Data saved with a previous flowsheet row definition      Vitals:   05/29/24 1032 05/29/24 1033  BP: (!) 154/67 (!) 144/54  Pulse: 67   Resp: 17   Temp: 97.7 F (36.5 C)   SpO2: 92%   Weight: 180 lb 6.4 oz (81.8 kg)   Height: 5' 3 (1.6 m)    Body mass index is 31.96 kg/m.  Physical Exam Constitutional:      General: She is not in acute distress.    Appearance: She is obese.  HENT:     Head: Normocephalic and atraumatic.     Nose: Nose normal.     Mouth/Throat:     Mouth: Mucous membranes are moist.  Eyes:     Conjunctiva/sclera: Conjunctivae normal.  Cardiovascular:     Rate and Rhythm: Normal rate and regular rhythm.  Pulmonary:     Effort: Pulmonary effort is normal.     Breath sounds: Normal breath sounds.  Abdominal:     General: Bowel sounds are normal.     Palpations: Abdomen is soft.  Musculoskeletal:     Cervical back: Normal range of motion.  Skin:    General: Skin is warm and dry.  Neurological:     Mental Status: She is alert.  Psychiatric:        Mood and Affect: Mood normal.        Behavior: Behavior normal.      Labs reviewed: Recent Labs    11/10/23 1820 11/11/23 0505 11/12/23 0608 11/19/23 0000 12/10/23 0000 03/10/24 0000 03/12/24 0000  NA 141 139 138 136* 138 139 140  K 3.5 3.4* 4.9 4.0  --  4.0 4.2  CL 99 101 102 102 102 102 102  CO2 30 31 31  28* 26* 31* 32*  GLUCOSE 123* 121* 105*  --   --   --   --   BUN 26* 27* 20 18 16  32* 22*  CREATININE 0.84 0.68 0.70 0.8 0.8 0.9 0.7  CALCIUM  8.4* 8.0* 8.1* 8.3* 8.9  --  8.8  MG 2.1 2.1 2.2  --   --   --   --   PHOS 2.0* 2.6 2.4*  --   --   --   --    Recent Labs    11/10/23 1820 11/11/23 0505 11/12/23 0608 11/19/23 0000 01/07/24 0000 03/10/24 0000  AST 31 30 32 16  25 19  ALT 17 16 18 9 14 15   ALKPHOS 39 36* 38 92 102 71  BILITOT 0.8 1.3* 1.7*  --   --   --   PROT 5.5* 5.1* 5.2*  --   --   --   ALBUMIN 2.7* 2.6* 2.6* 3.1* 3.7 3.2*   Recent Labs    11/11/23 0505 11/12/23 0608 11/19/23 0000 11/22/23 1039 12/10/23 0000  03/10/24 0000  WBC 6.9 6.8   < > 7.3 5.4 9.3  NEUTROABS 4.1 3.9  --   --  3,289.00 6,845.00  HGB 7.9* 7.6*   < > 11.4 12.8 12.5  HCT 24.7* 23.8*   < > 36.2 41 39  MCV 105.6* 105.3*  --  105*  --   --   PLT 126* 142*   < > 240 143* 97*   < > = values in this interval not displayed.   Lab Results  Component Value Date   TSH 2.13 05/05/2024   Lab Results  Component Value Date   HGBA1C 4.8 11/08/2023   Lab Results  Component Value Date   CHOL 159 09/14/2023   HDL 42 09/14/2023   LDLCALC 100 (H) 09/14/2023   TRIG 85 09/14/2023   CHOLHDL 3.8 09/14/2023    Significant Diagnostic Results in last 30 days:  No results found.  Assessment/Plan  1. Urinary tract infection without hematuria, site unspecified (Primary) -  Urine culture showed > than 100,000 CFU/mL of Proteus mirabilis.  Urinalysis showed positive nitrite, 3+ leukocyte esterase and many bacteria -  will start on Bactrim  DS 1 tab PO BID X 7 days   Family/ staff Communication: Discussed plan of care with resident and charge nurse.  Labs/tests ordered: None    Amaria Mundorf Medina-Vargas, DNP, MSN, FNP-BC [facility] [phone] (Monday-Friday 8:00 a.m. - 5:00 p.m.) [phone] (after hours)  "

## 2024-06-09 ENCOUNTER — Encounter: Payer: Self-pay | Admitting: Nurse Practitioner

## 2024-06-09 ENCOUNTER — Non-Acute Institutional Stay (SKILLED_NURSING_FACILITY): Admitting: Nurse Practitioner

## 2024-06-09 DIAGNOSIS — F0390 Unspecified dementia without behavioral disturbance: Secondary | ICD-10-CM

## 2024-06-09 DIAGNOSIS — M150 Primary generalized (osteo)arthritis: Secondary | ICD-10-CM | POA: Diagnosis not present

## 2024-06-09 DIAGNOSIS — I10 Essential (primary) hypertension: Secondary | ICD-10-CM | POA: Diagnosis not present

## 2024-06-09 DIAGNOSIS — E039 Hypothyroidism, unspecified: Secondary | ICD-10-CM

## 2024-06-09 DIAGNOSIS — I495 Sick sinus syndrome: Secondary | ICD-10-CM | POA: Diagnosis not present

## 2024-06-09 DIAGNOSIS — K5901 Slow transit constipation: Secondary | ICD-10-CM | POA: Diagnosis not present

## 2024-06-09 DIAGNOSIS — K219 Gastro-esophageal reflux disease without esophagitis: Secondary | ICD-10-CM

## 2024-06-09 DIAGNOSIS — Z66 Do not resuscitate: Secondary | ICD-10-CM | POA: Diagnosis not present

## 2024-06-09 DIAGNOSIS — I4819 Other persistent atrial fibrillation: Secondary | ICD-10-CM

## 2024-06-09 DIAGNOSIS — I509 Heart failure, unspecified: Secondary | ICD-10-CM

## 2024-06-09 DIAGNOSIS — F339 Major depressive disorder, recurrent, unspecified: Secondary | ICD-10-CM

## 2024-06-09 NOTE — Assessment & Plan Note (Signed)
 Heart rate is controlled, followed by cardiology, continue amiodarone  and Eliquis 

## 2024-06-09 NOTE — Assessment & Plan Note (Signed)
Her mood is stable, continue Lexapro.  

## 2024-06-09 NOTE — Assessment & Plan Note (Signed)
 Skilled nursing facility for care assistance No behavioral issues Continue memantine  and Prevagen

## 2024-06-09 NOTE — Assessment & Plan Note (Signed)
 takes Tylenol , ambulates with walker prior to fx of R hip 11/06/23, w/c for mobility now.

## 2024-06-09 NOTE — Assessment & Plan Note (Signed)
 Blood pressure is controlled Of metoprolol  due to low blood pressure Continue furosemide 

## 2024-06-09 NOTE — Progress Notes (Unsigned)
 " Location:  [facility] Room Number: N015-A Place of Service:  SNF (31) Provider: Shanee Batch X Fransico Sciandra NP    Patient Care Team: Charlanne Fredia CROME, MD as PCP - General (Internal Medicine) [name], FNP (Hospice and Palliative Medicine) [name], MD as Consulting Physician (Cardiology) Loni Soyla LABOR, MD as Consulting Physician (Cardiology)  Extended Emergency Contact Information Primary Emergency Contact: [facility] Phone: [phone] Mobile Phone: [phone] Relation: Daughter Interpreter needed? No Secondary Emergency Contact: Pearman,Karen Address: [address]          [place], [place] [address] [place] Home Phone: [phone] Mobile Phone: [phone] Relation: Daughter  Code Status:  DNR Goals of care: Advanced Directive information    05/29/2024   10:34 AM  Advanced Directives  Does Patient Have a Medical Advance Directive? Yes  Type of Advance Directive Out of facility DNR (pink MOST or yellow form)  Does patient want to make changes to medical advance directive? No - Patient declined     Chief Complaint  Patient presents with   medication management of chronic issues    Routine visit. Discuss the need for annual wellness visit.     HPI:  Pt is a 89 y.o. [name] seen today for medical management of chronic diseases.    Glaucoma, right eye, Combigan  ophthalmic solution             . Edema at her baseline, no increased respiratory symptoms, no O2 desaturation, weight fluctuating, at baseline as well.               Depression scored 2 on recent PHQ-9.  Tolerating Lexapro well, Na 140 03/12/2024              Hospitalized 11/06/23-11/12/23 for closed displaced intertrochanteric fx of R femur, s/p IM, WBAT. Hospital stay was complicated with acute blood loss anemia and acute respiratory failure with hypoxia.                BRBPR 05/23/23, FOBT positive 05/24/23, noted hemorrhoids, placed PPI, Hgb 12.8 12/10/23               01/11/23 family expressed concerning of hand and fingers tingling and numbness, desires checking TSH 4.38 01/26/23 If Namenda  is the culprit. The patient stated her tingling and numbness in hands and fingers has been the same, declined workup or neurology referral.              R heel, pressure ulcer, improving, scab over             Chronic numbness of R+L hands, has been using fingerless gloves which helped, declined workup, neurology referral, or medication             Anemia, post op, off Iron , Hgb 12.8 12/10/23             Senile dementia, tolerated Namenda  well, helped with her mood as well. MMSE 27/30 04/09/23, CT head 02/12/22 Mild age-related atrophy and chronic microvascular ischemic changes. Also taking Prevagen.              Afib, off Metoprolol , on Amiodarone , followed by Cardiology, on  Eliquis .             AS moderate             HTN off Metoprolol  05/27/23 2/2 low Bp, on Furosemide ,  f/u Cardiology. Bun/creat 22/0.7 03/12/2024             Pacemaker,  SSS,  f/u Cardiology             CHF, AS, edema BLE, on Furosemide . EF 70-75% 09/19/21, 03/27/22 Venous US , negative DVT. 03/30/22 arterial US  mild to moderate obstructive disease             CKD Bun/creat 22/0.7 03/12/2024             Dysphagia, nectar liquid.              Hypothyroidism, on Levothyroxine , TSH 2.13 05/05/24             OA, takes Tylenol , ambulates with walker prior to fx of R hip 11/06/23, w/c for mobility now.              GERD, stable, off Pantoprazole.  Hgb 12.5 03/10/2024             Constipation, taking Senokot S, MiraLax      Past Medical History:  Diagnosis Date   Aortic stenosis 03/08/2016   Brady-tachy syndrome (HCC)    a. Biotronik dual chamber PPM implanted 2009 b. gen change to STJ dual chamber PPM 2017   Ejection fraction    EF 70%, echo, October, 2009  //   EF 55-60%, septal dyssynergy consistent with a paced rhythm, mild to moderate mitral regurgitation, echo, November, 2015    GERD  (gastroesophageal reflux disease) 04/28/2014   Episodes November, 2015 with fluid refluxing from her esophagus.    Hair loss    Patient questioned Coumadin, changed toPradaxa   Hypertension    Hypothyroidism    Lower extremity edema 01/22/2018   Mitral regurgitation 05/03/2014   Mild-to-moderate mitral regurgitation, echo, November, 2015    Osteoarthritis of left knee 10/25/2017   Paroxysmal atrial fibrillation (HCC)    Episodes rapid atrial fib noted by pacemaker interrogation, August, 2011, diltiazem  added, patient improved. Patient continues on Rythmol   //   Changed to flecainide  2013  //   flecainide  level checked in 2013, good level    Persistent atrial fibrillation (HCC)    Presence of permanent cardiac pacemaker    Past Surgical History:  Procedure Laterality Date   ABDOMINAL HYSTERECTOMY     EP IMPLANTABLE DEVICE N/A 08/08/2015   Generator change with SJM Assurity DR PPM by Dr Kelsie   FEMUR IM NAIL Right 11/07/2023   Procedure: INSERTION, INTRAMEDULLARY ROD, FEMUR, RETROGRADE;  Surgeon: Genelle Standing, MD;  Location: [facility] OR;  Service: Orthopedics;  Laterality: Right;   JOINT REPLACEMENT     PACEMAKER PLACEMENT  2009        Allergies  Allergen Reactions   Morphine And Codeine Other (See Comments)    Patient states that she went crazy with this   Penicillins Swelling and Rash    Has patient had a PCN reaction causing immediate rash, facial/tongue/throat swelling, SOB or lightheadedness with hypotension: {Yes/No:30480221} Has patient had a PCN reaction causing severe rash involving mucus membranes or skin necrosis: {Yes/No:30480221} Has patient had a PCN reaction that required hospitalization {Yes/No:30480221} Has patient had a PCN reaction occurring within the last 10 years: {Yes/No:30480221} If all of the above answers are NO, then may proceed with Cephalosporin use.   Aspirin      Pacemaker   Clindamycin/Lincomycin Other (See Comments)    Any meds with mycin in the  name Unknown reaction Not listed on the [facility]   Crestor  [Rosuvastatin ]     Other reaction(s): muscle pain Not listed on the [facility]   Diltiazem      Skin discoloration,  lower extremity swelling Not listed on the [facility]   Erythromycin Other (See Comments)    Any meds with mycin in the name Unknown reaction Not listed on the [facility]   Lisinopril Other (See Comments)    REACTION: Cough   Metronidazole     Other reaction(s): mouth sores, tongue swelling   Morphine    Penicillins Cross Reactors Other (See Comments)    Any meds with mycin in the name Unknown reaction    Outpatient Encounter Medications as of 06/09/2024  Medication Sig   acetaminophen  (TYLENOL ) 325 MG tablet Take 650 mg by mouth every 4 (four) hours as needed for mild pain (pain score 1-3) (or fever).   acetaminophen  (TYLENOL ) 325 MG tablet Take 650 mg by mouth 2 (two) times daily. **Do not exceed 3,000mg /24 hours(scheduled + PRN doses)   amiodarone  (PACERONE ) 200 MG tablet Take 200 mg by mouth daily.   apixaban  (ELIQUIS ) 5 MG TABS tablet Take 1 tablet (5 mg total) by mouth 2 (two) times daily.   Apoaequorin (PREVAGEN) 10 MG CAPS Take 10 mg by mouth daily.   brimonidine -timolol  (COMBIGAN ) 0.2-0.5 % ophthalmic solution Place 1 drop into the right eye every 12 (twelve) hours.   Calcium  Carbonate-Vit D-Min (CALCIUM  600+D PLUS MINERALS) 600-400 MG-UNIT TABS Take 1 tablet by mouth daily.   Dextromethorphan -guaiFENesin  (MUCINEX  DM) 30-600 MG TB12 Take 1 tablet by mouth 2 (two) times daily. For constipation   escitalopram (LEXAPRO) 5 MG tablet Take 5 mg by mouth daily.   furosemide  (LASIX ) 20 MG tablet TAKE ONE TABLET BY MOUTH EVERY DAY   lactose free nutrition (BOOST) LIQD Take 237 mLs by mouth 2 (two) times daily between meals. Prefers chocolate   levothyroxine  (SYNTHROID ) 137 MCG tablet Take 137 mcg by mouth daily before breakfast.   lidocaine  (HM LIDOCAINE  PATCH) 4 % Place 1 patch onto the skin in the morning. Apply topically to L  shoulder in the morning. Apply for 12 hours (0900) then removed for 12 hours (2100).   loperamide (IMODIUM A-D) 2 MG tablet Take 2 mg by mouth 3 (three) times daily as needed for diarrhea or loose stools.   melatonin 5 MG TABS Take 1 tablet (5 mg total) by mouth at bedtime.   Multiple Vitamin (MULTIVITAMIN ADULT) TABS Take 1 tablet by mouth in the morning.   Multiple Vitamins-Minerals (OCUVITE PO) Take 1 capsule by mouth in the morning.   NAMENDA  10 MG tablet TAKE ONE TABLET BY MOUTH TWICE A DAY FOR COGNITIVE DECLINE   naphazoline-pheniramine (NAPHCON-A) 0.025-0.3 % ophthalmic solution Place 1 drop into both eyes 2 (two) times daily as needed for eye irritation (Q12 hours as needed).   ondansetron  (ZOFRAN ) 4 MG tablet Take 4 mg by mouth every 8 (eight) hours as needed for nausea or vomiting.   oxyCODONE  (ROXICODONE ) 5 MG immediate release tablet Take 1 tablet (5 mg total) by mouth every 6 (six) hours as needed for severe pain (pain score 7-10).   Polyethyl Glyc-Propyl Glyc PF (SYSTANE HYDRATION PF) 0.4-0.3 % SOLN Place 1 drop into the right eye in the morning, at noon, in the evening, and at bedtime.   polyethylene glycol (MIRALAX  / GLYCOLAX ) 17 g packet Take 17 g by mouth every other day.   potassium chloride  (KLOR-CON  M) 10 MEQ tablet Take 1 tablet (10 mEq total) by mouth daily.   sennosides-docusate sodium  (SENOKOT-S) 8.6-50 MG tablet Take 1 tablet by mouth daily.   [DISCONTINUED] albuterol  (PROVENTIL ) (2.5 MG/3ML) 0.083% nebulizer solution Take 3 mLs (2.5  mg total) by nebulization every 6 (six) hours as needed for wheezing or shortness of breath. (Patient not taking: Reported on 06/09/2024)   [DISCONTINUED] betamethasone , augmented, (DIPROLENE ) 0.05 % gel Apply 1 application  topically every 12 (twelve) hours as needed. (Patient not taking: Reported on 06/09/2024)   [DISCONTINUED] bisacodyl  (DULCOLAX) 10 MG suppository Place 1 suppository (10 mg total) rectally daily as needed for moderate  constipation. (Patient not taking: Reported on 06/09/2024)   [DISCONTINUED] diclofenac  Sodium (VOLTAREN ) 1 % GEL Apply 1 g topically 2 (two) times daily as needed (to L foot bunion deformity for pain). (Patient not taking: Reported on 06/09/2024)   [DISCONTINUED] Ipratropium-Albuterol  (COMBIVENT) 20-100 MCG/ACT AERS respimat Inhale 1 puff into the lungs every 6 (six) hours as needed for shortness of breath. (Patient not taking: Reported on 06/09/2024)   [DISCONTINUED] nystatin cream (MYCOSTATIN) Apply 1 Application topically every 12 (twelve) hours as needed (for rash). (Patient not taking: Reported on 06/09/2024)   [DISCONTINUED] ondansetron  (ZOFRAN ) 4 MG tablet Take 1 tablet (4 mg total) by mouth every 6 (six) hours as needed for nausea. (Patient not taking: Reported on 06/09/2024)   [DISCONTINUED] pantoprazole (PROTONIX) 40 MG tablet Take 40 mg by mouth daily as needed (for indigestion). (Patient not taking: Reported on 06/09/2024)   [DISCONTINUED] senna-docusate (SENOKOT-S) 8.6-50 MG tablet Take 1 tablet by mouth at bedtime as needed for mild constipation. (Patient not taking: Reported on 06/09/2024)   [DISCONTINUED] sulfamethoxazole -trimethoprim  (BACTRIM  DS) 800-160 MG tablet Take 1 tablet by mouth once. One time only for UTI for 1 day (Patient not taking: Reported on 06/09/2024)   No facility-administered encounter medications on file as of 06/09/2024.    Review of Systems  Constitutional:  Negative for appetite change, fatigue and fever.  HENT:  Positive for hearing loss and trouble swallowing. Negative for congestion.   Eyes:  Positive for discharge, redness and itching. Negative for pain and visual disturbance.  Respiratory:  Negative for cough, chest tightness and shortness of breath.   Cardiovascular:  Positive for leg swelling. Negative for chest pain and palpitations.  Gastrointestinal:  Negative for abdominal pain and constipation.  Genitourinary:  Negative for dysuria, frequency,  hematuria and urgency.  Musculoskeletal:  Positive for arthralgias and gait problem.       Knees, L>R, intermittent in nature.  Left shoulder pain with ROM  Skin:  Positive for wound.       Chronic venous insufficiency skin changes: mild erythema in mid of R+L Right heel pressure ulcer wound   Neurological:  Positive for numbness. Negative for tremors, speech difficulty, weakness and headaches.       Memory lapses. chronic numbness in fingers.   Psychiatric/Behavioral:  Negative for confusion and sleep disturbance. The patient is not nervous/anxious.        May be less smiling than usual upon my examination today?    Immunization History  Administered Date(s) Administered   Fluad Quad(high Dose 65+) 04/02/2022   Fluzone Influenza virus vaccine,trivalent (IIV3), split virus 02/24/2019, 03/08/2020, 03/25/2021   INFLUENZA, HIGH DOSE SEASONAL PF 03/08/2015, 04/03/2017, 03/17/2018, 03/08/2020, 04/15/2023   Influenza Split 03/11/2009, 03/11/2010, 02/25/2011   Influenza,inj,Quad PF,6+ Mos 03/08/2016   Influenza,inj,quad, With Preservative 03/11/2017   Influenza-Unspecified 03/17/2024   Moderna Covid-19 Vaccine  Bivalent Booster 20yrs & up 10/27/2021, 07/11/2023   Moderna SARS-COV2 Booster Vaccination 11/08/2020   Moderna Sars-Covid-2 Vaccination 06/15/2019, 07/13/2019, 04/19/2020, 02/27/2022   PNEUMOCOCCAL CONJUGATE-20 06/27/2023   Pfizer Covid-19 Vaccine Bivalent Booster 70yrs & up 02/28/2021, 04/12/2022   Pneumococcal Conjugate-13 12/06/2014  Pneumococcal Polysaccharide-23 05/13/2001, 03/11/2009   Polio, Unspecified 02/02/1958   RSV,unspecified 03/05/2024   Tdap 11/21/2011, 04/11/2023   Unspecified SARS-COV-2 Vaccination 04/06/2024   Zoster Recombinant(Shingrix) 07/18/2022, 10/19/2022   Pertinent  Health Maintenance Due  Topic Date Due   Influenza Vaccine  Completed   Bone Density Scan  Completed      02/16/2022    9:45 AM 02/27/2022   11:41 AM 07/06/2022   10:04 AM 07/13/2022     3:55 PM 03/25/2023    4:51 PM  Fall Risk  Falls in the past year?   0 0 1  Was there an injury with Fall?   0  0  0   Fall Risk Category Calculator   0 0 1  (RETIRED) Patient Fall Risk Level High fall risk  High fall risk      Patient at Risk for Falls Due to  History of fall(s) No Fall Risks No Fall Risks History of fall(s);Impaired balance/gait;Impaired mobility  Fall risk Follow up  Falls evaluation completed   Falls evaluation completed Falls evaluation completed;Falls prevention discussed;Education provided     Data saved with a previous flowsheet row definition   Functional Status Survey:    Vitals:   06/09/24 1034 06/09/24 1035  BP: (!) 145/68 (!) 154/67  Pulse: 70   SpO2: [name]%   Weight: 180 lb 6.4 oz (81.8 kg)   Height: 5' 3 (1.6 m)    Body mass index is 31.96 kg/m. Physical Exam Vitals and nursing note reviewed.  Constitutional:      Appearance: Normal appearance.  HENT:     Head: Normocephalic and atraumatic.     Nose: Nose normal.     Mouth/Throat:     Mouth: Mucous membranes are moist.  Eyes:     General:        Right eye: Discharge present.        Left eye: Discharge present.    Extraocular Movements: Extraocular movements intact.     Pupils: Pupils are equal, round, and reactive to light.     Comments: Mild injected right > left conjunctivae, no change of vision, watery, complaining of itching and eye pain.  Cardiovascular:     Rate and Rhythm: Normal rate. Rhythm irregular.     Heart sounds: Murmur heard.     Comments: No dorsalis pedis pulses felt from previous examination.  Pulmonary:     Effort: Pulmonary effort is normal.     Breath sounds: No rales.  Abdominal:     General: Bowel sounds are normal.     Palpations: Abdomen is soft.     Tenderness: There is no abdominal tenderness.  Genitourinary:    Comments: External hemorrhoids from previous examination Musculoskeletal:        General: Tenderness present. Normal range of motion.      Cervical back: Normal range of motion and neck supple.     Right lower leg: Edema present.     Left lower leg: Edema present.     Comments: Trace edema LLE, 1+ edema RLE Left shoulder pain with ROM R hip pain  Skin:    General: Skin is warm and dry.     Comments: Mild dark erythema mid of R+L lower legs, chronic.   R hip surgical scar Left heel healing pressure ulcer with scab over in a small area The left great toe ingrown toenail, dry blood at tip of toe, no redness, warmth, swelling noted.   Neurological:     General: No  focal deficit present.     Mental Status: She is alert and oriented to person, place, and time. Mental status is at baseline.     Gait: Gait abnormal.     Comments: 5/5 muscle strength in fingers.   Psychiatric:        Mood and Affect: Mood normal.        Behavior: Behavior normal.     Comments: Seems less smile than usual upon my examination today.     Labs reviewed: Recent Labs    11/10/23 1820 11/11/23 0505 11/12/23 0608 11/19/23 0000 12/10/23 0000 03/10/24 0000 03/12/24 0000  NA 141 139 138 136* 138 139 140  K 3.5 3.4* 4.9 4.0  --  4.0 4.2  CL 99 101 102 102 102 102 102  CO2 30 31 31  28* 26* 31* 32*  GLUCOSE 123* 121* 105*  --   --   --   --   BUN 26* 27* 20 18 16  32* 22*  CREATININE 0.84 0.68 0.70 0.8 0.8 0.9 0.7  CALCIUM  8.4* 8.0* 8.1* 8.3* 8.9  --  8.8  MG 2.1 2.1 2.2  --   --   --   --   PHOS 2.0* 2.6 2.4*  --   --   --   --    Recent Labs    11/10/23 1820 11/11/23 0505 11/12/23 0608 11/19/23 0000 01/07/24 0000 03/10/24 0000  AST 31 30 32 16 25 19   ALT 17 16 18 9 14 15   ALKPHOS 39 36* 38 [name] 102 71  BILITOT 0.8 1.3* 1.7*  --   --   --   PROT 5.5* 5.1* 5.2*  --   --   --   ALBUMIN 2.7* 2.6* 2.6* 3.1* 3.7 3.2*   Recent Labs    11/11/23 0505 11/12/23 0608 11/19/23 0000 11/22/23 1039 12/10/23 0000 03/10/24 0000  WBC 6.9 6.8   < > 7.3 5.4 9.3  NEUTROABS 4.1 3.9  --   --  3,289.00 6,845.00  HGB 7.9* 7.6*   < > 11.4 12.8 12.5   HCT 24.7* 23.8*   < > 36.2 41 39  MCV 105.6* 105.3*  --  105*  --   --   PLT 126* 142*   < > 240 143* 97*   < > = values in this interval not displayed.   Lab Results  Component Value Date   TSH 2.13 05/05/2024   Lab Results  Component Value Date   HGBA1C 4.8 11/08/2023   Lab Results  Component Value Date   CHOL 159 09/14/2023   HDL 42 09/14/2023   LDLCALC 100 (H) 09/14/2023   TRIG 85 09/14/2023   CHOLHDL 3.8 09/14/2023    Significant Diagnostic Results in last 30 days:  No results found.  Assessment/Plan Congestive heart failure (HCC) Mild edema BLE, no respiratory symptoms, continue furosemide   Hypothyroidism on Levothyroxine , TSH 2.13 05/05/24  Osteoarthritis, multiple sites takes Tylenol , ambulates with walker prior to fx of R hip 11/06/23, w/c for mobility now.   GERD (gastroesophageal reflux disease) stable, off Pantoprazole.  Hgb 12.5 03/10/2024  Slow transit constipation Stable taking Senokot S, MiraLax   Sinus node dysfunction (HCC) Has pacemaker SSS,  f/u Cardiology  Hypertension Blood pressure is controlled Of metoprolol  due to low blood pressure Continue furosemide   Persistent atrial fibrillation (HCC) Heart rate is controlled, followed by cardiology, continue amiodarone  and Eliquis   Major neurocognitive disorder [facility]) Skilled nursing facility for care assistance No behavioral issues Continue memantine  and Prevagen  Recurrent depression Her mood is stable, continue Lexapro     Family/ staff Communication: Plan of care reviewed with the patient and charge nurse  Labs/tests ordered: None    "

## 2024-06-09 NOTE — Assessment & Plan Note (Signed)
 stable, off Pantoprazole.  Hgb 12.5 03/10/2024

## 2024-06-09 NOTE — Assessment & Plan Note (Signed)
 Has pacemaker SSS,  f/u Cardiology

## 2024-06-09 NOTE — Assessment & Plan Note (Signed)
 Mild edema BLE, no respiratory symptoms, continue furosemide 

## 2024-06-09 NOTE — Assessment & Plan Note (Signed)
 Stable taking Senokot S, MiraLax 

## 2024-06-09 NOTE — Assessment & Plan Note (Signed)
 on Levothyroxine , TSH 2.13 05/05/24

## 2024-06-10 ENCOUNTER — Encounter: Payer: Self-pay | Admitting: Nurse Practitioner

## 2024-06-17 ENCOUNTER — Other Ambulatory Visit: Payer: Self-pay

## 2024-06-17 ENCOUNTER — Emergency Department (HOSPITAL_COMMUNITY)

## 2024-06-17 ENCOUNTER — Observation Stay (HOSPITAL_COMMUNITY)
Admission: EM | Admit: 2024-06-17 | Discharge: 2024-06-18 | Disposition: A | Discharge status: Skilled Nursing Facility | Source: Skilled Nursing Facility | Attending: Family Medicine | Admitting: Family Medicine

## 2024-06-17 DIAGNOSIS — I340 Nonrheumatic mitral (valve) insufficiency: Secondary | ICD-10-CM | POA: Diagnosis not present

## 2024-06-17 DIAGNOSIS — S0990XA Unspecified injury of head, initial encounter: Secondary | ICD-10-CM | POA: Diagnosis not present

## 2024-06-17 DIAGNOSIS — I509 Heart failure, unspecified: Secondary | ICD-10-CM | POA: Diagnosis not present

## 2024-06-17 DIAGNOSIS — R059 Cough, unspecified: Secondary | ICD-10-CM | POA: Diagnosis present

## 2024-06-17 DIAGNOSIS — J9601 Acute respiratory failure with hypoxia: Secondary | ICD-10-CM | POA: Diagnosis present

## 2024-06-17 DIAGNOSIS — K5901 Slow transit constipation: Secondary | ICD-10-CM | POA: Diagnosis present

## 2024-06-17 DIAGNOSIS — Z79899 Other long term (current) drug therapy: Secondary | ICD-10-CM | POA: Insufficient documentation

## 2024-06-17 DIAGNOSIS — I4819 Other persistent atrial fibrillation: Secondary | ICD-10-CM | POA: Diagnosis not present

## 2024-06-17 DIAGNOSIS — I350 Nonrheumatic aortic (valve) stenosis: Secondary | ICD-10-CM | POA: Insufficient documentation

## 2024-06-17 DIAGNOSIS — K219 Gastro-esophageal reflux disease without esophagitis: Secondary | ICD-10-CM | POA: Diagnosis not present

## 2024-06-17 DIAGNOSIS — Z9181 History of falling: Secondary | ICD-10-CM | POA: Insufficient documentation

## 2024-06-17 DIAGNOSIS — S72141S Displaced intertrochanteric fracture of right femur, sequela: Secondary | ICD-10-CM

## 2024-06-17 DIAGNOSIS — R2689 Other abnormalities of gait and mobility: Secondary | ICD-10-CM | POA: Insufficient documentation

## 2024-06-17 DIAGNOSIS — I480 Paroxysmal atrial fibrillation: Secondary | ICD-10-CM | POA: Diagnosis not present

## 2024-06-17 DIAGNOSIS — I10 Essential (primary) hypertension: Secondary | ICD-10-CM | POA: Diagnosis present

## 2024-06-17 DIAGNOSIS — Z79891 Long term (current) use of opiate analgesic: Secondary | ICD-10-CM | POA: Insufficient documentation

## 2024-06-17 DIAGNOSIS — I130 Hypertensive heart and chronic kidney disease with heart failure and stage 1 through stage 4 chronic kidney disease, or unspecified chronic kidney disease: Secondary | ICD-10-CM | POA: Insufficient documentation

## 2024-06-17 DIAGNOSIS — R2681 Unsteadiness on feet: Secondary | ICD-10-CM | POA: Insufficient documentation

## 2024-06-17 DIAGNOSIS — Z7901 Long term (current) use of anticoagulants: Secondary | ICD-10-CM | POA: Insufficient documentation

## 2024-06-17 DIAGNOSIS — I495 Sick sinus syndrome: Secondary | ICD-10-CM | POA: Diagnosis present

## 2024-06-17 DIAGNOSIS — W19XXXA Unspecified fall, initial encounter: Secondary | ICD-10-CM | POA: Diagnosis present

## 2024-06-17 DIAGNOSIS — E039 Hypothyroidism, unspecified: Secondary | ICD-10-CM | POA: Diagnosis not present

## 2024-06-17 DIAGNOSIS — M6281 Muscle weakness (generalized): Secondary | ICD-10-CM | POA: Insufficient documentation

## 2024-06-17 DIAGNOSIS — N183 Chronic kidney disease, stage 3 unspecified: Secondary | ICD-10-CM | POA: Diagnosis present

## 2024-06-17 DIAGNOSIS — Z950 Presence of cardiac pacemaker: Secondary | ICD-10-CM | POA: Diagnosis present

## 2024-06-17 DIAGNOSIS — S81819A Laceration without foreign body, unspecified lower leg, initial encounter: Secondary | ICD-10-CM | POA: Diagnosis not present

## 2024-06-17 LAB — COMPREHENSIVE METABOLIC PANEL WITH GFR
ALT: 21 U/L (ref 0–44)
AST: 36 U/L (ref 15–41)
Albumin: 3.3 g/dL — ABNORMAL LOW (ref 3.5–5.0)
Alkaline Phosphatase: 92 U/L (ref 38–126)
Anion gap: 9 (ref 5–15)
BUN: 15 mg/dL (ref 8–23)
CO2: 28 mmol/L (ref 22–32)
Calcium: 8.9 mg/dL (ref 8.9–10.3)
Chloride: 104 mmol/L (ref 98–111)
Creatinine, Ser: 0.99 mg/dL (ref 0.44–1.00)
GFR, Estimated: 53 mL/min — ABNORMAL LOW
Glucose, Bld: 110 mg/dL — ABNORMAL HIGH (ref 70–99)
Potassium: 4.2 mmol/L (ref 3.5–5.1)
Sodium: 141 mmol/L (ref 135–145)
Total Bilirubin: 0.4 mg/dL (ref 0.0–1.2)
Total Protein: 6.3 g/dL — ABNORMAL LOW (ref 6.5–8.1)

## 2024-06-17 LAB — CBC
HCT: 36.2 % (ref 36.0–46.0)
Hemoglobin: 11.7 g/dL — ABNORMAL LOW (ref 12.0–15.0)
MCH: 32.3 pg (ref 26.0–34.0)
MCHC: 32.3 g/dL (ref 30.0–36.0)
MCV: 100 fL (ref 80.0–100.0)
Platelets: 126 K/uL — ABNORMAL LOW (ref 150–400)
RBC: 3.62 MIL/uL — ABNORMAL LOW (ref 3.87–5.11)
RDW: 13.2 % (ref 11.5–15.5)
WBC: 5.7 K/uL (ref 4.0–10.5)
nRBC: 0 % (ref 0.0–0.2)

## 2024-06-17 LAB — RESP PANEL BY RT-PCR (RSV, FLU A&B, COVID)  RVPGX2
Influenza A by PCR: NEGATIVE
Influenza B by PCR: NEGATIVE
Resp Syncytial Virus by PCR: NEGATIVE
SARS Coronavirus 2 by RT PCR: NEGATIVE

## 2024-06-17 MED ORDER — AMIODARONE HCL 200 MG PO TABS
200.0000 mg | ORAL_TABLET | Freq: Every day | ORAL | Status: DC
  Administered 2024-06-17 – 2024-06-18 (×2): 200 mg via ORAL
  Filled 2024-06-17 (×2): qty 1

## 2024-06-17 MED ORDER — TRAZODONE HCL 50 MG PO TABS: 25.0000 mg | ORAL_TABLET | Freq: Every evening | ORAL | Status: DC | PRN

## 2024-06-17 MED ORDER — LEVOTHYROXINE SODIUM 25 MCG PO TABS
137.0000 ug | ORAL_TABLET | Freq: Every day | ORAL | Status: DC
  Administered 2024-06-18: 137 ug via ORAL
  Filled 2024-06-17: qty 1

## 2024-06-17 MED ORDER — ACETAMINOPHEN 650 MG RE SUPP: 650.0000 mg | Freq: Four times a day (QID) | RECTAL | Status: DC | PRN

## 2024-06-17 MED ORDER — IPRATROPIUM BROMIDE 0.02 % IN SOLN
0.5000 mg | Freq: Four times a day (QID) | RESPIRATORY_TRACT | Status: DC
  Administered 2024-06-17 – 2024-06-18 (×3): 0.5 mg via RESPIRATORY_TRACT
  Filled 2024-06-17 (×3): qty 2.5

## 2024-06-17 MED ORDER — ONDANSETRON HCL 4 MG/2ML IJ SOLN: 4.0000 mg | Freq: Four times a day (QID) | INTRAMUSCULAR | Status: DC | PRN

## 2024-06-17 MED ORDER — SODIUM CHLORIDE 0.9 % IV SOLN
100.0000 mg | Freq: Two times a day (BID) | INTRAVENOUS | Status: DC
  Administered 2024-06-17 – 2024-06-18 (×2): 100 mg via INTRAVENOUS
  Filled 2024-06-17 (×3): qty 100

## 2024-06-17 MED ORDER — OYSTER SHELL CALCIUM/D3 500-5 MG-MCG PO TABS
1.0000 | ORAL_TABLET | Freq: Every day | ORAL | Status: DC
  Administered 2024-06-17 – 2024-06-18 (×2): 1 via ORAL
  Filled 2024-06-17 (×3): qty 1

## 2024-06-17 MED ORDER — HEPARIN SODIUM (PORCINE) 5000 UNIT/ML IJ SOLN: 5000.0000 [IU] | Freq: Three times a day (TID) | INTRAMUSCULAR | Status: DC

## 2024-06-17 MED ORDER — MELATONIN 5 MG PO TABS
5.0000 mg | ORAL_TABLET | Freq: Every day | ORAL | Status: DC
  Administered 2024-06-17: 5 mg via ORAL
  Filled 2024-06-17: qty 1

## 2024-06-17 MED ORDER — METHYLPREDNISOLONE SODIUM SUCC 40 MG IJ SOLR: 40.0000 mg | INTRAMUSCULAR | Status: DC

## 2024-06-17 MED ORDER — FLEET ENEMA RE ENEM: 1.0000 | ENEMA | Freq: Once | RECTAL | Status: DC | PRN

## 2024-06-17 MED ORDER — ESCITALOPRAM OXALATE 10 MG PO TABS
5.0000 mg | ORAL_TABLET | Freq: Every day | ORAL | Status: DC
  Administered 2024-06-18: 5 mg via ORAL
  Filled 2024-06-17: qty 1

## 2024-06-17 MED ORDER — SENNOSIDES-DOCUSATE SODIUM 8.6-50 MG PO TABS
1.0000 | ORAL_TABLET | Freq: Every day | ORAL | Status: DC
  Administered 2024-06-17: 1 via ORAL
  Filled 2024-06-17: qty 1

## 2024-06-17 MED ORDER — IPRATROPIUM BROMIDE 0.02 % IN SOLN: 0.5000 mg | Freq: Four times a day (QID) | RESPIRATORY_TRACT | Status: DC | PRN

## 2024-06-17 MED ORDER — BISACODYL 5 MG PO TBEC: 5.0000 mg | DELAYED_RELEASE_TABLET | Freq: Every day | ORAL | Status: DC | PRN

## 2024-06-17 MED ORDER — SENNOSIDES-DOCUSATE SODIUM 8.6-50 MG PO TABS: 1.0000 | ORAL_TABLET | Freq: Every evening | ORAL | Status: DC | PRN

## 2024-06-17 MED ORDER — TIMOLOL MALEATE 0.5 % OP SOLN
1.0000 [drp] | Freq: Two times a day (BID) | OPHTHALMIC | Status: DC
  Administered 2024-06-17 – 2024-06-18 (×2): 1 [drp] via OPHTHALMIC
  Filled 2024-06-17: qty 5

## 2024-06-17 MED ORDER — SODIUM CHLORIDE 0.9 % IV SOLN: INTRAVENOUS | Status: DC

## 2024-06-17 MED ORDER — APIXABAN 5 MG PO TABS
5.0000 mg | ORAL_TABLET | Freq: Two times a day (BID) | ORAL | Status: DC
  Administered 2024-06-18: 5 mg via ORAL
  Filled 2024-06-17: qty 1

## 2024-06-17 MED ORDER — BRIMONIDINE TARTRATE-TIMOLOL 0.2-0.5 % OP SOLN: 1.0000 [drp] | Freq: Two times a day (BID) | OPHTHALMIC | Status: DC

## 2024-06-17 MED ORDER — BRIMONIDINE TARTRATE 0.2 % OP SOLN
1.0000 [drp] | Freq: Two times a day (BID) | OPHTHALMIC | Status: DC
  Administered 2024-06-17 – 2024-06-18 (×2): 1 [drp] via OPHTHALMIC
  Filled 2024-06-17: qty 5

## 2024-06-17 MED ORDER — LEVALBUTEROL HCL 1.25 MG/0.5ML IN NEBU
1.2500 mg | INHALATION_SOLUTION | Freq: Four times a day (QID) | RESPIRATORY_TRACT | Status: DC
  Administered 2024-06-18: 1.25 mg via RESPIRATORY_TRACT
  Filled 2024-06-17 (×3): qty 0.5

## 2024-06-17 MED ORDER — METHYLPREDNISOLONE SODIUM SUCC 40 MG IJ SOLR: 40.0000 mg | Freq: Two times a day (BID) | INTRAMUSCULAR | Status: DC

## 2024-06-17 MED ORDER — POLYETHYL GLYC-PROPYL GLYC PF 0.4-0.3 % OP SOLN: 1.0000 [drp] | Freq: Three times a day (TID) | OPHTHALMIC | Status: DC

## 2024-06-17 MED ORDER — ADULT MULTIVITAMIN W/MINERALS CH
1.0000 | ORAL_TABLET | Freq: Every morning | ORAL | Status: DC
  Administered 2024-06-18: 1 via ORAL
  Filled 2024-06-17: qty 1

## 2024-06-17 MED ORDER — POLYVINYL ALCOHOL 1.4 % OP SOLN: 1.0000 [drp] | OPHTHALMIC | Status: DC | PRN

## 2024-06-17 MED ORDER — APOAEQUORIN 10 MG PO CAPS: 10.0000 mg | ORAL_CAPSULE | Freq: Every day | ORAL | Status: DC

## 2024-06-17 MED ORDER — CALCIUM 600+D PLUS MINERALS 600-400 MG-UNIT PO TABS: 1.0000 | ORAL_TABLET | Freq: Every day | ORAL | Status: DC

## 2024-06-17 MED ORDER — MEMANTINE HCL 10 MG PO TABS
10.0000 mg | ORAL_TABLET | Freq: Every day | ORAL | Status: DC
  Administered 2024-06-18: 10 mg via ORAL
  Filled 2024-06-17: qty 1

## 2024-06-17 MED ORDER — SODIUM CHLORIDE 0.9 % IV SOLN: 1.0000 g | INTRAVENOUS | Status: DC

## 2024-06-17 MED ORDER — FUROSEMIDE 20 MG PO TABS
20.0000 mg | ORAL_TABLET | Freq: Every day | ORAL | Status: DC
  Administered 2024-06-18: 20 mg via ORAL
  Filled 2024-06-17: qty 1

## 2024-06-17 MED ORDER — HYDROMORPHONE HCL 1 MG/ML IJ SOLN: 0.5000 mg | INTRAMUSCULAR | Status: DC | PRN

## 2024-06-17 MED ORDER — OXYCODONE HCL 5 MG PO TABS
5.0000 mg | ORAL_TABLET | ORAL | Status: DC | PRN
  Administered 2024-06-18: 5 mg via ORAL
  Filled 2024-06-17: qty 1

## 2024-06-17 MED ORDER — IOHEXOL 350 MG/ML SOLN
75.0000 mL | Freq: Once | INTRAVENOUS | Status: AC | PRN
  Administered 2024-06-17: 75 mL via INTRAVENOUS

## 2024-06-17 MED ORDER — HYDRALAZINE HCL 20 MG/ML IJ SOLN: 10.0000 mg | INTRAMUSCULAR | Status: DC | PRN

## 2024-06-17 MED ORDER — SODIUM CHLORIDE 0.9% FLUSH
3.0000 mL | Freq: Two times a day (BID) | INTRAVENOUS | Status: DC
  Administered 2024-06-17: 3 mL via INTRAVENOUS

## 2024-06-17 MED ORDER — SODIUM CHLORIDE 0.9 % IV SOLN
1.0000 g | INTRAVENOUS | Status: DC
  Administered 2024-06-17: 1 g via INTRAVENOUS
  Filled 2024-06-17: qty 10

## 2024-06-17 MED ORDER — SODIUM CHLORIDE 0.9% FLUSH
3.0000 mL | Freq: Two times a day (BID) | INTRAVENOUS | Status: DC
  Administered 2024-06-17 – 2024-06-18 (×2): 3 mL via INTRAVENOUS

## 2024-06-17 MED ORDER — ACETAMINOPHEN 325 MG PO TABS
650.0000 mg | ORAL_TABLET | Freq: Once | ORAL | Status: AC
  Administered 2024-06-17: 650 mg via ORAL
  Filled 2024-06-17: qty 2

## 2024-06-17 MED ORDER — GUAIFENESIN 100 MG/5ML PO LIQD
10.0000 mL | Freq: Three times a day (TID) | ORAL | Status: DC
  Administered 2024-06-17 – 2024-06-18 (×3): 10 mL via ORAL
  Filled 2024-06-17: qty 10
  Filled 2024-06-17 (×2): qty 15

## 2024-06-17 MED ORDER — ONDANSETRON HCL 4 MG PO TABS: 4.0000 mg | ORAL_TABLET | Freq: Four times a day (QID) | ORAL | Status: DC | PRN

## 2024-06-17 MED ORDER — ACETAMINOPHEN 325 MG PO TABS
650.0000 mg | ORAL_TABLET | Freq: Four times a day (QID) | ORAL | Status: DC | PRN
  Administered 2024-06-17: 650 mg via ORAL
  Filled 2024-06-17: qty 2

## 2024-06-17 NOTE — ED Triage Notes (Signed)
 According to ems: [facility] feel during an attempt at going to the restroom. On scene a large laceration to the back right side of the head was found. On scene pt primarily complained of hip and back pain. 50 MCG of fentanyl  was given along with 4 mg of Zofran  by ems.  Vitals : Bp 126/54 Spo2 90% room air and 97% 4L RR 16 hr 50

## 2024-06-17 NOTE — Assessment & Plan Note (Signed)
 No signs of overt volume overload -Avoiding further IV fluid resuscitation -Holding Lasix  tonight, resuming in a.m.  Last echo April 2025, EF greater than 75% LV hyperdynamic function, severe LVH, grade 2 diastolic dysfunction, RV function within normal limits moderately dilated left atria, mild MR calcified aortic valve

## 2024-06-17 NOTE — Assessment & Plan Note (Addendum)
 Noted With history of sick sinus syndrome, pacemaker in place, No complication, heart rate well-controlled

## 2024-06-17 NOTE — Assessment & Plan Note (Signed)
 Monitoring kidney function closely -BUN/creatinine at baseline -Avoiding nephrotoxins

## 2024-06-17 NOTE — Hospital Course (Addendum)
 [name] is a 89 year old female with extensive history of A-fib on Eliquis , CHF, aortic stenosis, sick sinus syndrome, s/p pacemaker, GERD, dementia, HTN, hypothyroidism,.... Presented to ED from skilled nursing facility-post accidental fall.  Patient apparently had an accidental fall in about 2, and hit the back of her head.  Mainly posterior aspect, with no loss of consciousness.  Patient has dementia, very poor historian but pleasant.  Recalls that she had fallen.  Daughter present at bedside.  Reports had a fall and broke her hip earlier this past year since then she has been rehab unsteady gait uses a walker-she got up without assist to go to the bathroom and fell and hit her head.  Confirmed no loss of consciousness no other trauma.  Daughter also reports that for the past week or so she had an upper respiratory symptoms, was tested for flu and COVID was negative.   ED Evaluation: Blood pressure 100/60, pulse 62, temperature 98.5 F (36.9 C), temperature source Oral, resp. rate 16, height 5' 3 (1.6 m), weight 81 kg,  SpO2 93%.  On 2 L of oxygen  Labs: CBC CMP reviewed, hemoglobin 0.7, glucose 100, GFR 53  CT angio chest- No evidence of PE, moderate basilar atelectasis and/or infiltrate, small nonhemorrhagic right pleural effusion, cirrhotic liver, 4-5 mm noncalcified right middle lobe nodule CT cervical spine/head - No evidence of fracture, or misalignment of cervical spine, chronic changes maxillary left sphenoid sinus opacification-consistent with sinusitis CT thoracic/lumbar spine- No acute traumatic injuries in lumbar or thoracic spine identified, L3/4 L4-5 advanced lumbar degenerative disease with spinal stenosis-  Laceration of posterior head, with stable-bleeding has stopped Requested patient to be admitted for close observation, possible upper respiratory infection, with new oxygen requirement

## 2024-06-17 NOTE — Assessment & Plan Note (Addendum)
 With holding patient's Eliquis  tonight for posterior scalp hematoma -Reevaluating and reinitiating in a.m.

## 2024-06-17 NOTE — ED Provider Notes (Signed)
 " [facility] Provider Note   CSN: [id number] Arrival date & time: 06/17/24  1136     Patient presents with: No chief complaint on file.   [name] is a 89 y.o. female with a past medical history significant for A-fib on Eliquis , CHF, aortic stenosis, tachybradycardia syndrome and sick sinus dysfunction status post pacemaker, GERD, dementia, hypertension, and hypothyroidism who presents to the ED as a level 2 trauma due to fall on thinners.  Patient was in the bathroom and fell back and hit the posterior aspect of her head.  On Eliquis .  Denies LOC.  Admits to a slight headache.  Denies nausea vomiting.  No visual or speech changes.  Also admits to some low back pain. History of dementia, level 5 caveat.  History obtained from patient, EMS, daughters, and past medical records. No interpreter used during encounter.      Prior to Admission medications  Medication Sig Start Date End Date Taking? Authorizing Provider  acetaminophen  (TYLENOL ) 325 MG tablet Take 650 mg by mouth every 4 (four) hours as needed for mild pain (pain score 1-3) (or fever).    [provider]  acetaminophen  (TYLENOL ) 325 MG tablet Take 650 mg by mouth 2 (two) times daily. **Do not exceed 3,000mg /24 hours(scheduled + PRN doses)    [provider]  amiodarone  (PACERONE ) 200 MG tablet Take 200 mg by mouth daily.    [provider]  apixaban  (ELIQUIS ) 5 MG TABS tablet Take 1 tablet (5 mg total) by mouth 2 (two) times daily. 10/02/23   Medina-Vargas, Monina C, NP  Apoaequorin (PREVAGEN) 10 MG CAPS Take 10 mg by mouth daily. 11/13/22   Cleotilde Garnette HERO, MD  brimonidine -timolol  (COMBIGAN ) 0.2-0.5 % ophthalmic solution Place 1 drop into the right eye every 12 (twelve) hours.    [provider]  Calcium  Carbonate-Vit D-Min (CALCIUM  600+D PLUS MINERALS) 600-400 MG-UNIT TABS Take 1 tablet by mouth daily. 11/13/22   Cleotilde Garnette HERO, MD   Dextromethorphan -guaiFENesin  (MUCINEX  DM) 30-600 MG TB12 Take 1 tablet by mouth 2 (two) times daily. For constipation    [provider]  escitalopram  (LEXAPRO ) 5 MG tablet Take 5 mg by mouth daily.    [provider]  furosemide  (LASIX ) 20 MG tablet TAKE ONE TABLET BY MOUTH EVERY DAY 03/19/23   [name], MD  lactose free nutrition (BOOST) LIQD Take 237 mLs by mouth 2 (two) times daily between meals. Prefers chocolate    [provider]  levothyroxine  (SYNTHROID ) 137 MCG tablet Take 137 mcg by mouth daily before breakfast.    [provider]  lidocaine  (HM LIDOCAINE  PATCH) 4 % Place 1 patch onto the skin in the morning. Apply topically to L shoulder in the morning. Apply for 12 hours (0900) then removed for 12 hours (2100).    [provider]  loperamide (IMODIUM A-D) 2 MG tablet Take 2 mg by mouth 3 (three) times daily as needed for diarrhea or loose stools.    [provider]  melatonin 5 MG TABS Take 1 tablet (5 mg total) by mouth at bedtime. 02/26/24   Fargo, Amy E, NP  Multiple Vitamin (MULTIVITAMIN ADULT) TABS Take 1 tablet by mouth in the morning.    [provider]  Multiple Vitamins-Minerals (OCUVITE PO) Take 1 capsule by mouth in the morning.    [provider]  NAMENDA  10 MG tablet TAKE ONE TABLET BY MOUTH TWICE A DAY FOR COGNITIVE DECLINE 04/23/23  [name], MD  naphazoline-pheniramine (NAPHCON-A) 0.025-0.3 % ophthalmic solution Place 1 drop into both eyes 2 (two) times daily as needed for eye irritation (Q12 hours as needed).    [provider]  ondansetron  (ZOFRAN ) 4 MG tablet Take 4 mg by mouth every 8 (eight) hours as needed for nausea or vomiting.    [provider]  oxyCODONE  (ROXICODONE ) 5 MG immediate release tablet Take 1 tablet (5 mg total) by mouth every 6 (six) hours as needed for severe pain (pain score 7-10). 01/15/24   Medina-Vargas, Monina C, NP  Polyethyl  Glyc-Propyl Glyc PF (SYSTANE HYDRATION PF) 0.4-0.3 % SOLN Place 1 drop into the right eye in the morning, at noon, in the evening, and at bedtime.    [provider]  polyethylene glycol (MIRALAX  / GLYCOLAX ) 17 g packet Take 17 g by mouth every other day. 11/13/23   Medina-Vargas, Monina C, NP  potassium chloride  (KLOR-CON  M) 10 MEQ tablet Take 1 tablet (10 mEq total) by mouth daily. 06/28/23   [name], MD  sennosides-docusate sodium  (SENOKOT-S) 8.6-50 MG tablet Take 1 tablet by mouth daily. 11/13/23   Medina-Vargas, Monina C, NP    Allergies: Morphine and codeine, Penicillins, Aspirin , Clindamycin/lincomycin, Crestor  [rosuvastatin ], Diltiazem , Erythromycin, Lisinopril, Metronidazole, Morphine, and Penicillins cross reactors    Review of Systems  Unable to perform ROS: Dementia    Updated Vital Signs BP 100/60   Pulse 62   Temp 98.5 F (36.9 C) (Oral)   Resp 16   Ht 5' 3 (1.6 m)   Wt 81 kg   SpO2 93%   BMI 31.63 kg/m   Physical Exam Vitals and nursing note reviewed.  Constitutional:      General: She is not in acute distress.    Appearance: She is not ill-appearing.  HENT:     Head: Normocephalic.     Comments: Scalp laceration on posterior aspect 4cm v shaped Eyes:     Pupils: Pupils are equal, round, and reactive to light.  Neck:     Comments: C-collar placed during initial evaluation Cardiovascular:     Rate and Rhythm: Normal rate and regular rhythm.     Pulses: Normal pulses.     Heart sounds: Normal heart sounds. No murmur heard.    No friction rub. No gallop.  Pulmonary:     Effort: Pulmonary effort is normal.     Breath sounds: Normal breath sounds.  Abdominal:     General: Abdomen is flat. There is no distension.     Palpations: Abdomen is soft.     Tenderness: There is no abdominal tenderness. There is no guarding or rebound.  Musculoskeletal:        General: Normal range of motion.     Cervical back: Neck supple.     Comments: No  thoracic midline tenderness. Mild lumbar midline tenderness  Skin:    General: Skin is warm and dry.  Neurological:     General: No focal deficit present.     Mental Status: She is alert.     Comments: Moves all 4 extremities without difficulty No pronator drift No facial droop Normal speech  Psychiatric:        Mood and Affect: Mood normal.        Behavior: Behavior normal.     (all labs ordered are listed, but only abnormal results are displayed) Labs Reviewed  COMPREHENSIVE METABOLIC PANEL WITH GFR - Abnormal; Notable for the following components:      Result  Value   Glucose, Bld 110 (*)    Total Protein 6.3 (*)    Albumin 3.3 (*)    GFR, Estimated 53 (*)    All other components within normal limits  CBC - Abnormal; Notable for the following components:   RBC 3.62 (*)    Hemoglobin 11.7 (*)    Platelets 126 (*)    All other components within normal limits  URINALYSIS, ROUTINE W REFLEX MICROSCOPIC    EKG: None  Radiology: CT Angio Chest PE W and/or Wo Contrast Result Date: 06/17/2024 CLINICAL DATA:  Possible pulmonary hemorrhage. EXAM: CT ANGIOGRAPHY CHEST WITH CONTRAST TECHNIQUE: Multidetector CT imaging of the chest was performed using the standard protocol during bolus administration of intravenous contrast. Multiplanar CT image reconstructions and MIPs were obtained to evaluate the vascular anatomy. RADIATION DOSE REDUCTION: This exam was performed according to the departmental dose-optimization program which includes automated exposure control, adjustment of the mA and/or kV according to patient size and/or use of iterative reconstruction technique. CONTRAST:  75mL OMNIPAQUE  IOHEXOL  350 MG/ML SOLN COMPARISON:  None Available. FINDINGS: Cardiovascular: There is moderate to marked severity calcification of the aortic arch and descending thoracic aorta. A multi lead AICD is in place. Satisfactory opacification of the pulmonary arteries to the segmental level. No evidence of  pulmonary embolism. Normal heart size. No pericardial effusion. Mediastinum/Nodes: A solitary subcentimeter calcified right hilar lymph node is present. Thyroid  gland, trachea, and esophagus demonstrate no significant findings. Lungs/Pleura: A 3 mm calcified pulmonary nodule is seen within the lateral aspect of the right upper lobe (axial CT image 56, CT series 14). 4 mm and 5 mm noncalcified pulmonary nodules are seen within the lateral aspect of the right middle lobe (axial CT image 93 and 98, CT series 14). Mild to moderate severity areas of atelectasis and/or infiltrate are seen within the posterior aspects of the bilateral lung bases. There is a small, nonhemorrhagic right pleural effusion. No pneumothorax is identified. Upper Abdomen: The liver is cirrhotic in appearance. Musculoskeletal: Multilevel degenerative changes are present throughout the thoracic spine. Review of the MIP images confirms the above findings. IMPRESSION: 1. No evidence of pulmonary embolism. 2. Mild to moderate severity bibasilar atelectasis and/or infiltrate. 3. Small, nonhemorrhagic right pleural effusion. 4. Cirrhotic liver. 5. 4 mm and 5 mm noncalcified right middle lobe pulmonary nodules. No follow-up needed if patient is low-risk (and has no known or suspected primary neoplasm). Non-contrast chest CT can be considered in 12 months if patient is high-risk. This recommendation follows the consensus statement: Guidelines for Management of Incidental Pulmonary Nodules Detected on CT Images: From the Fleischner Society 2017; Radiology 2017; 284:228-243. 6. Aortic atherosclerosis. Electronically Signed   By: Suzen Dials M.D.   On: 06/17/2024 14:47   CT Lumbar Spine Wo Contrast Result Date: 06/17/2024 EXAM: CT OF THE LUMBAR SPINE WITHOUT CONTRAST 06/17/2024 12:46:42 PM TECHNIQUE: CT of the lumbar spine was performed without the administration of intravenous contrast. Multiplanar reformatted images are provided for review.  Automated exposure control, iterative reconstruction, and/or weight based adjustment of the mA/kV was utilized to reduce the radiation dose to as low as reasonably achievable. COMPARISON: Thoracic spine reported separately today. Lumbar radiographs 01/15/2011. CLINICAL HISTORY: 89 year old female with moderate-severe head trauma from a fall while on blood thinners, and posterior head laceration. FINDINGS: This study is mildly motion degraded. BONES AND ALIGNMENT: Normal lumbar segmentation, concordant with the thoracic numbering today. Osteopenia. Lumbar vertebral height and lordosis are within normal limits. Lower thoracic levels are reported  separately today. Visible sacrum and SI joints appear intact. No acute osseous abnormality. DEGENERATIVE CHANGES: Advanced lower lumbar spine degeneration L3-L4 through L5-S1 with severe disc space loss, vacuum disc, moderate and severe posterior element hypertrophy. Subsequently there is CT evidence of severe multifactorial spinal stenosis at both L3-L4 (series 9 image 77) and L4-L5 (series 9 image 92). SOFT TISSUES: Advanced calcified aortoiliac atherosclerosis. Otherwise negative visible non-contrast abdominal viscera. Lower lumbar paraspinal muscle atrophy. IMPRESSION: 1. Mildly motion degraded, No acute traumatic injury identified in the lumbar spine. 2. Advanced lower lumbar degeneration with severe spinal stenosis at L3-L4 and L4-L5. Electronically signed by: Helayne Hurst MD 06/17/2024 01:08 PM EST RP Workstation: [id number]   CT Thoracic Spine Wo Contrast Result Date: 06/17/2024 EXAM: CT THORACIC SPINE WITHOUT CONTRAST 06/17/2024 12:46:42 PM TECHNIQUE: CT of the thoracic spine was performed without the administration of intravenous contrast. Multiplanar reformatted images are provided for review. Automated exposure control, iterative reconstruction, and/or weight based adjustment of the mA/kV was utilized to reduce the radiation dose to as low as reasonably  achievable. COMPARISON: 2 view chest radiographs 08/21/2023. CLINICAL HISTORY: 89 year old female with back trauma, no prior imaging. Head trauma, moderate-severe, fall while on blood thinners, posterior head laceration. FINDINGS: This exam is mildly motion degraded. Thoracic segmentation appears to be normal. BONES AND ALIGNMENT: Mildly exaggerated thoracic kyphosis in the setting of chronic T7 and T11 compression fractures which appear stable from last year. Normal vertebral body heights except for chronic T7 and T11 compression fractures. No acute fracture or suspicious bone lesion. Grossly intact visible posterior ribs. SOFT TISSUES: Partially visible cardiac pacemaker leads. Advanced calcified aortic atherosclerosis. Combined patchy and confluent bilateral lower lobe peribronchial and dependent opacity plus trace layering right pleural effusion. Right lower lobe appearance indeterminate for atelectasis versus infection. Negative non-contrast thoracic paraspinal soft tissues. Visible upper abdominal viscera. DEGENERATIVE: Age appropriate thoracic spine degeneration. No high grade thoracic spinal stenosis by CT. IMPRESSION: 1. No acute thoracic osseous abnormality. Stable chronic T7 and T11 compression fractures. 2. Right > left lower lobe opacities, indeterminate for atelectasis versus infection. Trace right pleural effusion. Electronically signed by: Helayne Hurst MD 06/17/2024 01:04 PM EST RP Workstation: [id number]   CT HEAD WO CONTRAST Result Date: 06/17/2024 EXAM: CT HEAD WITHOUT CONTRAST 06/17/2024 12:46:42 PM TECHNIQUE: CT of the head was performed without the administration of intravenous contrast. Automated exposure control, iterative reconstruction, and/or weight based adjustment of the mA/kV was utilized to reduce the radiation dose to as low as reasonably achievable. COMPARISON: Head CT 10/29/2023 and 11/06/2023. CLINICAL HISTORY: 89 year old female with head trauma, moderate-severe, fall while on  blood thinners, and posterior head laceration. FINDINGS: BRAIN AND VENTRICLES: No acute hemorrhage. No evidence of acute infarct. No hydrocephalus. No extra-axial collection. No mass effect or midline shift. Stable brain volume. Patchy chronic cerebral white matter hypodensity. Stable gray-white differentiation. No suspicious intracranial vascular hyperdensity. Advanced calcified atherosclerosis at the skull base. ORBITS: No acute abnormality. SINUSES: Chronic left maxillary sinus disease with progressive bilateral paranasal sinus opacification since last year, but appears most likely inflammatory. Tympanic cavities and mastoids remain well aerated. SOFT TISSUES AND SKULL: Left posterior convexity broad based scalp soft tissue swelling and irregularity with some areas of scalp soft tissue gas indicating laceration. Underlying skull appears stable and intact. IMPRESSION: 1. Left posterior convexity scalp soft tissue injury. 2. No acute intracranial abnormality identified. 3. Progressive paranasal sinus opacification, favor inflammatory rather than post-traumatic. Electronically signed by: Helayne Hurst MD 06/17/2024 12:58 PM EST RP Workstation: [id number]  DG Pelvis Portable Result Date: 06/17/2024 CLINICAL DATA:  Clemens. EXAM: PORTABLE PELVIS 1-2 VIEWS COMPARISON:  Pelvis CT dated 11/06/2023 and right femur radiographs dated 01/17/2024. FINDINGS: Again demonstrated is hardware fixation of a previously demonstrated comminuted right intertrochanteric fracture. Unchanged position and alignment. No acute fracture or dislocation. Multiple gallstones in the gallbladder measuring up to 10 mm in maximum diameter each without correction for magnification. Lower lumbar spine degenerative changes. IMPRESSION: 1. No acute fracture or dislocation. 2. Cholelithiasis. Electronically Signed   By: Elspeth Bathe M.D.   On: 06/17/2024 12:15   DG Chest Port 1 View Result Date: 06/17/2024 CLINICAL DATA:  Clemens. EXAM: PORTABLE CHEST 1  VIEW COMPARISON:  11/11/2023 FINDINGS: Stable enlarged cardiac silhouette. Tortuous and partially calcified thoracic aorta. No significant change in linear atelectasis or scarring at the left lung base. Interval mild patchy density at the right lung base. Stable small right mid upper lung zone calcified granuloma. Left subclavian bipolar pacemaker leads in satisfactory position. Moderate right glenohumeral degenerative changes. IMPRESSION: 1. Interval mild patchy atelectasis, pneumonia or pulmonary hemorrhage at the right lung base. 2. Stable cardiomegaly. Electronically Signed   By: Elspeth Bathe M.D.   On: 06/17/2024 12:12     Procedures   Medications Ordered in the ED  acetaminophen  (TYLENOL ) tablet 650 mg (has no administration in time range)  iohexol  (OMNIPAQUE ) 350 MG/ML injection 75 mL (75 mLs Intravenous Contrast Given 06/17/24 1424)    Clinical Course as of 06/17/24 1529  Wed Jun 17, 2024  1329 Patient had tdap vaccine on 03/2023. No need for booster [CA]  1433 Discussed with [facility] radiology about the read for CT cervical spine.  Supposedly there was an issue with imaging and needs to be repeated. Radiology discussing with CT.  [CA]    Clinical Course User Index [CA] Lorelle Aleck BROCKS, PA-C                                 Medical Decision Making Amount and/or Complexity of Data Reviewed Independent Historian: caregiver    Details: Both daughters at bedside provided history Labs: ordered. Decision-making details documented in ED Course. Radiology: ordered and independent interpretation performed. Decision-making details documented in ED Course. ECG/medicine tests: ordered and independent interpretation performed. Decision-making details documented in ED Course.  Risk Prescription drug management.   This patient presents to the ED for concern of fall, this involves an extensive number of treatment options, and is a complaint that carries with it a high risk of  complications and morbidity.  The differential diagnosis includes intracranial bleed, bony fracture, dehydration, infection, etc  89 year old female presents to the ED as a level 2 trauma due to fall on thinners.  Patient hit the posterior aspect of her head.  On Eliquis .  No LOC.  History of dementia.  Upon arrival, vitals all within normal limits.  Patient has laceration to posterior aspect of head.  C-collar placed during initial evaluation. Lungs clear to quotation bilaterally.  Does have mild lumbar midline tenderness.  Able to move all 4 extremities without difficulty.  Portable pelvis and chest x-Chatterjee obtained during ischial evaluation.  CT head, cervical spine, thoracic, and lumbar.  Routine labs ordered. Discussed with Dr. Mannie who evaluated patient at bedside and agrees with assessment and plan.  Tdap given on 03/2023.  No need for booster at this time.   CBC with no leukocytosis.  Hemoglobin 11.7.  CMP with normal renal  function.  No major electrolyte derangements.  Chest x-Fossett personally viewed interpreted demonstrates mild patchy atelectasis likely pneumonia versus pulmonary hemorrhage at the right lung base.  Given trauma and possible hemorrhage will obtain CTA chest.  Pelvis x-Heringer negative.  Does demonstrate some gallstones.  Patient has no right upper quadrant tenderness.  Low suspicion for acute cholecystitis.  CT head demonstrates soft tissue injury from laceration.  No intracranial bleed.  CT thoracic negative for any acute bony fractures.  Does demonstrate stable compression fractures. CT L spine negative for bony fracture.   Head wound thoroughly cleaned, Scalp laceration repaired by Dr. Mannie.   3:26 PM assessed patient at bedside.  Admits to some pain.  Tylenol  given.  Patient handed off to Lavanda Lesches, PA-C at shift change pending CT cervical spine.  If unremarkable patient may be discharged home.  Co morbidities that complicate the patient evaluation  HTN  Social  Determinants of Health:  Elderly >65  Test / Admission - Considered:  Disposition pending at shift change, anticipate discharge home if CT cervical spine negative     Final diagnoses:  Injury of head, initial encounter  Fall, initial encounter    ED Discharge Orders     None          Lorelle Aleck JAYSON DEVONNA 06/17/24 1535    Mannie Pac T, DO 06/18/24 [phone]  "

## 2024-06-17 NOTE — ED Notes (Signed)
 C-collar placed per Elouise MD.

## 2024-06-17 NOTE — Assessment & Plan Note (Signed)
 Blood pressure remained stable, resuming home medication Medication including Lasix , amiodarone  will be started in a.m. Will monitor blood pressure, orthostatic BP checks

## 2024-06-17 NOTE — Progress Notes (Signed)
 Orthopedic Tech Progress Note Patient Details:  [name] 04-11-1928 [id number]  Patient ID: [name], female   DOB: 1928-02-23, 89 y.o.   MRN: [id number] Respond to level 2 trauma [name] 06/17/2024, 11:49 AM

## 2024-06-17 NOTE — Assessment & Plan Note (Signed)
 Recent upper respiratory infection, -Possible sinusitis based on imaging -Patient received Rocephin /doxycycline  in ED Will continue with doxycycline  -Mucolytic's,

## 2024-06-17 NOTE — Assessment & Plan Note (Signed)
"  Continue PPI  "

## 2024-06-17 NOTE — Assessment & Plan Note (Signed)
 Accidental skin tear lower extremity superficial, no signs of bleeding, dressing in place Will monitor continue changing dressing per nursing

## 2024-06-17 NOTE — Assessment & Plan Note (Addendum)
 Heart rate steady-stable -Pacemaker in place -Continue follow-up with cardiologist, No changes in current medications - Continue amiodarone , restarting Eliquis  in a.m.

## 2024-06-17 NOTE — ED Provider Notes (Signed)
 FOT - waiting for cspine to result  Physical Exam  BP 100/60   Pulse 62   Temp 98.5 F (36.9 C) (Oral)   Resp 16   Ht 5' 3 (1.6 m)   Wt 81 kg   SpO2 93%   BMI 31.63 kg/m   Physical Exam  Procedures  .Critical Care  Performed by: Arloa Chroman, PA-C Authorized by: Arloa Chroman, PA-C   Critical care provider statement:    Critical care time (minutes):  30   Critical care time was exclusive of:  Separately billable procedures and treating other patients   Critical care was necessary to treat or prevent imminent or life-threatening deterioration of the following conditions:  Respiratory failure   Critical care was time spent personally by me on the following activities:  Development of treatment plan with patient or surrogate, discussions with consultants, evaluation of patient's response to treatment, examination of patient, ordering and review of laboratory studies, ordering and review of radiographic studies, ordering and performing treatments and interventions, pulse oximetry, re-evaluation of patient's condition, review of old charts and obtaining history from patient or surrogate  Awake and alert diffuse rhonchi with cough No active bleeding from scalp wound which has already been repaired ED Course / MDM   Clinical Course as of 06/17/24 1511  Wed Jun 17, 2024  1329 Patient had tdap vaccine on 03/2023. No need for booster [CA]  1433 Discussed with [facility] radiology about the read for CT cervical spine.  Supposedly there was an issue with imaging and needs to be repeated. Radiology discussing with CT.  [CA]    Clinical Course User Index [CA] Lorelle Aleck BROCKS, PA-C   Medical Decision Making Amount and/or Complexity of Data Reviewed Labs: ordered. Radiology: ordered.  Risk OTC drugs. Prescription drug management. Decision regarding hospitalization.      Patient here after fall.  Family reports that she has had a bad cough for several days.  She has no  previous history of hypoxic respiratory failure but continues to need 2 L of oxygen due to desaturation to 84% without oxygen supplementation.  I reviewed previous images.  CT angiogram shows bilateral atelectasis versus infiltrate and a small pleural effusion. I have ordered a viral panel clinically I suspect she likely has pneumonia given her new hypoxic respiratory failure and respiratory symptoms.  She does not appear to have any traumatic causes of such.  Case discussed with Dr. Willette who will admit the patient.  Respiratory panel is pending    Arloa Chroman, PA-C 06/17/24 1800    Freddi Hamilton, MD 06/17/24 2115

## 2024-06-17 NOTE — TOC CM/SW Note (Signed)
 TOC consult received for d/c planning needs. Per notes, patient arrived from [place] [facility]. Message sent via Epic to confirm and to determine if she is LTC or STR at their facility. Awaiting response. Follow-up to be completed with patient as appropriate.   Merilee Batty, MSN, RN Case Management [phone]

## 2024-06-17 NOTE — Assessment & Plan Note (Signed)
 Status post accidental fall, unsteady on on her gait Was trying to ambulate without delivering assist -Fall in the bathroom, hit her head, posterior scalp laceration -Status post stapling of posterior scalp Hematoma-noted, bleeding has stopped -Will monitor closely -Holding her Eliquis  for tonight

## 2024-06-17 NOTE — Assessment & Plan Note (Signed)
 Acute hypoxic respiratory failure -New requirement for O2, 2-3 L, currently satting 93% -Patient is afebrile with no leukocytosis, will obtaining inflammatory markers including procalcitonin, and lactic acid level -Imaging only consistent with possible sinusitis -Obtaining upper respiratory viral panel -Initiating empiric oral antibiotic azithromycin for possible sinusitis -Initiating DuoNeb bronchodilator treatments -Short course of IV steroids -Encouraging spirometer, flutter valve -Will wean off O2

## 2024-06-17 NOTE — ED Notes (Signed)
 Pt sats dropped to 84% on room air. Nasal cannula applied @ 2L

## 2024-06-17 NOTE — Assessment & Plan Note (Signed)
 History of chronic aortic stenosis, -Denies any chest pain -Monitoring, supportive care

## 2024-06-17 NOTE — Assessment & Plan Note (Signed)
"-  Continue bowel regimen     "

## 2024-06-17 NOTE — Assessment & Plan Note (Signed)
 -  Continue home dose Synthroid

## 2024-06-17 NOTE — H&P (Signed)
 " History and Physical   Patient: [name]                            PCP: Charlanne Fredia CROME, MD                    DOB: Jul 31, 1927            DOA: 06/17/2024 FMW:[id number]             DOS: 06/17/2024, 6:38 PM  Charlanne Fredia CROME, MD  Patient coming from:   HOME  I have personally reviewed patient's medical records, in electronic medical records, including:  [facility], and care everywhere.    Chief Complaint:   No chief complaint on file.   History of present illness:    [name] is a 89 year old female with extensive history of A-fib on Eliquis , CHF, aortic stenosis, sick sinus syndrome, s/p pacemaker, GERD, dementia, HTN, hypothyroidism,.... Presented to ED from skilled nursing facility-post accidental fall.  Patient apparently had an accidental fall in about 2, and hit the back of her head.  Mainly posterior aspect, with no loss of consciousness.  Patient has dementia, very poor historian but pleasant.  Recalls that she had fallen.  Daughter present at bedside.  Reports had a fall and broke her hip earlier this past year since then she has been rehab unsteady gait uses a walker-she got up without assist to go to the bathroom and fell and hit her head.  Confirmed no loss of consciousness no other trauma.  Daughter also reports that for the past week or so she had an upper respiratory symptoms, was tested for flu and COVID was negative.   ED Evaluation: Blood pressure 100/60, pulse 62, temperature 98.5 F (36.9 C), temperature source Oral, resp. rate 16, height 5' 3 (1.6 m), weight 81 kg,  SpO2 93%.  On 2 L of oxygen  Labs: CBC CMP reviewed, hemoglobin 0.7, glucose 100, GFR 53  CT angio chest- No evidence of PE, moderate basilar atelectasis and/or infiltrate, small nonhemorrhagic right pleural effusion, cirrhotic liver, 4-5 mm noncalcified right middle lobe nodule CT cervical spine/head - No evidence of fracture, or misalignment of cervical spine, chronic changes maxillary  left sphenoid sinus opacification-consistent with sinusitis CT thoracic/lumbar spine- No acute traumatic injuries in lumbar or thoracic spine identified, L3/4 L4-5 advanced lumbar degenerative disease with spinal stenosis-  Laceration of posterior head, with stable-bleeding has stopped Requested patient to be admitted for close observation, possible upper respiratory infection, with new oxygen requirement       Patient Denies having: Fever, Chills, Cough, SOB, Chest Pain, Abd pain, N/V/D, headache, dizziness, lightheadedness,  Dysuria, Joint pain, rash, open wounds     Review of Systems: As per HPI, otherwise 10 point review of systems were negative.   ----------------------------------------------------------------------------------------------------------------------  Allergies  Allergen Reactions   Morphine And Codeine Other (See Comments)    Patient states that she went crazy with this   Penicillins Swelling and Rash    Has patient had a PCN reaction causing immediate rash, facial/tongue/throat swelling, SOB or lightheadedness with hypotension:  Has patient had a PCN reaction causing severe rash involving mucus membranes or skin necrosis: { Has patient had a PCN reaction that required hospitalization  Has patient had a PCN reaction occurring within the last 10 years:  If all of the above answers are NO, then may proceed with Cephalosporin use.   Aspirin   Pacemaker   Clindamycin/Lincomycin Other (See Comments)    Any meds with mycin in the name Unknown reaction Not listed on the [facility]   Crestor  [Rosuvastatin ]     Other reaction(s): muscle pain Not listed on the [facility]   Diltiazem      Skin discoloration, lower extremity swelling Not listed on the [facility]   Erythromycin Other (See Comments)    Any meds with mycin in the name Unknown reaction Not listed on the [facility]   Lisinopril Other (See Comments)    REACTION: Cough   Metronidazole     Other reaction(s): mouth sores,  tongue swelling   Morphine    Penicillins Cross Reactors Other (See Comments)    Any meds with mycin in the name Unknown reaction    Home MEDs:  Prior to Admission medications  Medication Sig Start Date End Date Taking? Authorizing Provider  acetaminophen  (TYLENOL ) 325 MG tablet Take 650 mg by mouth every 4 (four) hours as needed for mild pain (pain score 1-3) (or fever).    [provider]  amiodarone  (PACERONE ) 200 MG tablet Take 200 mg by mouth daily.    [provider]  apixaban  (ELIQUIS ) 5 MG TABS tablet Take 1 tablet (5 mg total) by mouth 2 (two) times daily. 10/02/23   Medina-Vargas, Monina C, NP  Apoaequorin (PREVAGEN) 10 MG CAPS Take 10 mg by mouth daily. 11/13/22   Cleotilde Garnette HERO, MD  brimonidine -timolol  (COMBIGAN ) 0.2-0.5 % ophthalmic solution Place 1 drop into the right eye every 12 (twelve) hours.    [provider]  Calcium  Carbonate-Vit D-Min (CALCIUM  600+D PLUS MINERALS) 600-400 MG-UNIT TABS Take 1 tablet by mouth daily. 11/13/22   Cleotilde Garnette HERO, MD  Dextromethorphan -guaiFENesin  (MUCINEX  DM) 30-600 MG TB12 Take 1 tablet by mouth 2 (two) times daily. For constipation    [provider]  escitalopram  (LEXAPRO ) 5 MG tablet Take 5 mg by mouth daily.    [provider]  furosemide  (LASIX ) 20 MG tablet TAKE ONE TABLET BY MOUTH EVERY DAY 03/19/23   [name], MD  lactose free nutrition (BOOST) LIQD Take 237 mLs by mouth 2 (two) times daily between meals. Prefers chocolate    [provider]  levothyroxine  (SYNTHROID ) 137 MCG tablet Take 137 mcg by mouth daily before breakfast.    [provider]  lidocaine  (HM LIDOCAINE  PATCH) 4 % Place 1 patch onto the skin in the morning. Apply topically to L shoulder in the morning. Apply for 12 hours (0900) then removed for 12 hours (2100).    [provider]  loperamide (IMODIUM A-D) 2 MG tablet Take 2 mg by mouth 3 (three) times daily as needed for diarrhea or  loose stools.    [provider]  melatonin 5 MG TABS Take 1 tablet (5 mg total) by mouth at bedtime. 02/26/24   Fargo, Amy E, NP  Multiple Vitamin (MULTIVITAMIN ADULT) TABS Take 1 tablet by mouth in the morning.    [provider]  Multiple Vitamins-Minerals (OCUVITE PO) Take 1 capsule by mouth in the morning.    [provider]  NAMENDA  10 MG tablet TAKE ONE TABLET BY MOUTH TWICE A DAY FOR COGNITIVE DECLINE 04/23/23   Veludandi, Prashanthi, MD  naphazoline-pheniramine (NAPHCON-A) 0.025-0.3 % ophthalmic solution Place 1 drop into both eyes 2 (two) times daily as needed for eye irritation (Q12 hours as needed).    [provider]  ondansetron  (ZOFRAN ) 4 MG tablet Take 4 mg by mouth every 8 (eight) hours as  needed for nausea or vomiting.    [provider]  oxyCODONE  (ROXICODONE ) 5 MG immediate release tablet Take 1 tablet (5 mg total) by mouth every 6 (six) hours as needed for severe pain (pain score 7-10). 01/15/24   Medina-Vargas, Monina C, NP  Polyethyl Glyc-Propyl Glyc PF (SYSTANE HYDRATION PF) 0.4-0.3 % SOLN Place 1 drop into the right eye in the morning, at noon, in the evening, and at bedtime.    [provider]  polyethylene glycol (MIRALAX  / GLYCOLAX ) 17 g packet Take 17 g by mouth every other day. 11/13/23   Medina-Vargas, Monina C, NP  potassium chloride  (KLOR-CON  M) 10 MEQ tablet Take 1 tablet (10 mEq total) by mouth daily. 06/28/23   [name], MD  sennosides-docusate sodium  (SENOKOT-S) 8.6-50 MG tablet Take 1 tablet by mouth daily. 11/13/23   Medina-Vargas, Monina C, NP    PRN MEDs: acetaminophen  **OR** acetaminophen , hydrALAZINE , HYDROmorphone  (DILAUDID ) injection, ondansetron  **OR** ondansetron  (ZOFRAN ) IV, oxyCODONE   Past Medical History:  Diagnosis Date   Aortic stenosis 03/08/2016   Brady-tachy syndrome (HCC)    a. Biotronik dual chamber PPM implanted 2009 b. gen change to STJ dual chamber PPM 2017   Ejection fraction     EF 70%, echo, October, 2009  //   EF 55-60%, septal dyssynergy consistent with a paced rhythm, mild to moderate mitral regurgitation, echo, November, 2015    GERD (gastroesophageal reflux disease) 04/28/2014   Episodes November, 2015 with fluid refluxing from her esophagus.    Hair loss    Patient questioned Coumadin, changed toPradaxa   Hypertension    Hypothyroidism    Lower extremity edema 01/22/2018   Mitral regurgitation 05/03/2014   Mild-to-moderate mitral regurgitation, echo, November, 2015    Osteoarthritis of left knee 10/25/2017   Paroxysmal atrial fibrillation (HCC)    Episodes rapid atrial fib noted by pacemaker interrogation, August, 2011, diltiazem  added, patient improved. Patient continues on Rythmol   //   Changed to flecainide  2013  //   flecainide  level checked in 2013, good level    Persistent atrial fibrillation (HCC)    Presence of permanent cardiac pacemaker     Past Surgical History:  Procedure Laterality Date   ABDOMINAL HYSTERECTOMY     EP IMPLANTABLE DEVICE N/A 08/08/2015   Generator change with SJM Assurity DR PPM by Dr Kelsie   FEMUR IM NAIL Right 11/07/2023   Procedure: INSERTION, INTRAMEDULLARY ROD, FEMUR, RETROGRADE;  Surgeon: Genelle Standing, MD;  Location: [facility] OR;  Service: Orthopedics;  Laterality: Right;   JOINT REPLACEMENT     PACEMAKER PLACEMENT  2009         reports that she quit smoking about 33 years ago. Her smoking use included cigarettes. She has never used smokeless tobacco. She reports that she does not drink alcohol  and does not use drugs.   Family History  Problem Relation Age of Onset   Heart disease Father    Coronary artery disease Other    Heart disease Son     Physical Exam:   Vitals:   06/17/24 1138 06/17/24 1140 06/17/24 1143 06/17/24 1155  BP:   100/60   Pulse:  62    Resp:  16    Temp: 98.5 F (36.9 C)     TempSrc: Oral     SpO2:  93%    Weight:    81 kg  Height:    5' 3 (1.6 m)   Constitutional: NAD, calm,  comfortable Eyes: PERRL, lids and conjunctivae normal ENMT: Mucous  membranes are moist. Posterior pharynx clear of any exudate or lesions.Normal dentition.  Neck: normal, supple, no masses, no thyromegaly Respiratory: clear to auscultation bilaterally, no wheezing, no crackles. Normal respiratory effort. No accessory muscle use.  Cardiovascular: Regular rate and rhythm, no murmurs / rubs / gallops. No extremity edema. 2+ pedal pulses. No carotid bruits.  Abdomen: no tenderness, no masses palpated. No hepatosplenomegaly. Bowel sounds positive.  Musculoskeletal: no clubbing / cyanosis. No joint deformity upper and lower extremities. Good ROM, no contractures. Normal muscle tone.  Neurologic: CN II-XII grossly intact. Sensation intact, DTR normal. Strength 5/5 in all 4.  Psychiatric: Mood stable, poor memory, otherwise pleasant alert and oriented x 2. Normal mood.  Skin: Warm to touch negative rashes, posterior scalp, mild hematoma, laceration, staples in place -below knee lateral superficial lower extremity skin tear dressing in place, negative any hematoma, drainage    Labs on admission:    I have personally reviewed following labs and imaging studies  CBC: Recent Labs  Lab 06/17/24 1219  WBC 5.7  HGB 11.7*  HCT 36.2  MCV 100.0  PLT 126*   Basic Metabolic Panel: Recent Labs  Lab 06/17/24 1219  NA 141  K 4.2  CL 104  CO2 28  GLUCOSE 110*  BUN 15  CREATININE 0.99  CALCIUM  8.9   GFR: Estimated Creatinine Clearance: 35 mL/min (by C-G formula based on SCr of 0.99 mg/dL). Liver Function Tests: Recent Labs  Lab 06/17/24 1219  AST 36  ALT 21  ALKPHOS 92  BILITOT 0.4  PROT 6.3*  ALBUMIN 3.3*    Urine analysis:    Component Value Date/Time   COLORURINE YELLOW 11/08/2023 1032   APPEARANCEUR CLEAR 11/08/2023 1032   LABSPEC 1.025 11/08/2023 1032   PHURINE 5.0 11/08/2023 1032   GLUCOSEU NEGATIVE 11/08/2023 1032   HGBUR NEGATIVE 11/08/2023 1032   BILIRUBINUR NEGATIVE  11/08/2023 1032   BILIRUBINUR neg 04/26/2012 1343   KETONESUR NEGATIVE 11/08/2023 1032   PROTEINUR NEGATIVE 11/08/2023 1032   UROBILINOGEN 1.0 04/26/2012 1343   UROBILINOGEN 0.2 03/20/2011 1540   NITRITE NEGATIVE 11/08/2023 1032   LEUKOCYTESUR NEGATIVE 11/08/2023 1032    Last A1C:  Lab Results  Component Value Date   HGBA1C 4.8 11/08/2023     Radiologic Exams on Admission:   CT CERVICAL SPINE WO CONTRAST Result Date: 06/17/2024 EXAM: CT CERVICAL SPINE WITHOUT CONTRAST 06/17/2024 12:46:42 PM TECHNIQUE: CT of the cervical spine was performed without the administration of intravenous contrast. Multiplanar reformatted images are provided for review. Automated exposure control, iterative reconstruction, and/or weight based adjustment of the mA/kV was utilized to reduce the radiation dose to as low as reasonably achievable. COMPARISON: CT cervical spine 11/06/2023. CLINICAL HISTORY: Polytrauma, blunt. FINDINGS: BONES AND ALIGNMENT: No acute fracture or traumatic malalignment of the cervical spine. Overall, similar trace degenerative changes retrolisthesis of C4 on C5. There is diffuse osseous demineralization. DEGENERATIVE CHANGES: There is multilevel degenerative intervertebral disc height loss. There are multilevel disc bulges/disc osteophyte complexes and posterior ligamentum flavum thickening contributing to possible spinal cord impingement at C3-C4, C4-C5, and C5-C6, with associated moderate spinal canal stenosis. This appears grossly similar to prior exam though is not completely evaluated by CT. There is multilevel uncovertebral spurring and facet arthropathy with neural foraminal stenosis most pronounced at the right C4-C5, left C5-C6 and left C6-C7 levels, similar to prior exam. SOFT TISSUES: No prevertebral soft tissue swelling. Opacification of the bilateral maxillary sinuses is partially evaluated. Mucosal thickening right maxillary sinus. Opacification of left sphenoid sinus.  Left chest  wall cardiac device. Atherosclerotic calcifications.  IMPRESSION: 1. No acute fracture or traumatic malalignment of the cervical spine. 2. Since 11/06/2023, overall similar though partially evaluated by CT is moderate spinal canal stenosis from C3 through C6, which would be better evaluated by MRI. 3. Opacification of the bilateral maxillary and left sphenoid sinuses, correlate for sinusitis. Electronically signed by: Prentice Spade MD 06/17/2024 03:29 PM EST RP Workstation: [id number]   CT Angio Chest PE W and/or Wo Contrast Result Date: 06/17/2024 CLINICAL DATA:  Possible pulmonary hemorrhage. EXAM: CT ANGIOGRAPHY CHEST WITH CONTRAST TECHNIQUE: Multidetector CT imaging of the chest was performed using the standard protocol during bolus administration of intravenous contrast. Multiplanar CT image reconstructions and MIPs were obtained to evaluate the vascular anatomy. RADIATION DOSE REDUCTION: This exam was performed according to the departmental dose-optimization program which includes automated exposure control, adjustment of the mA and/or kV according to patient size and/or use of iterative reconstruction technique. CONTRAST:  75mL OMNIPAQUE  IOHEXOL  350 MG/ML SOLN COMPARISON:  None Available. FINDINGS: Cardiovascular: There is moderate to marked severity calcification of the aortic arch and descending thoracic aorta. A multi lead AICD is in place. Satisfactory opacification of the pulmonary arteries to the segmental level. No evidence of pulmonary embolism. Normal heart size. No pericardial effusion. Mediastinum/Nodes: A solitary subcentimeter calcified right hilar lymph node is present. Thyroid  gland, trachea, and esophagus demonstrate no significant findings. Lungs/Pleura: A 3 mm calcified pulmonary nodule is seen within the lateral aspect of the right upper lobe (axial CT image 56, CT series 14). 4 mm and 5 mm noncalcified pulmonary nodules are seen within the lateral aspect of the right middle lobe (axial  CT image 93 and 98, CT series 14). Mild to moderate severity areas of atelectasis and/or infiltrate are seen within the posterior aspects of the bilateral lung bases. There is a small, nonhemorrhagic right pleural effusion. No pneumothorax is identified. Upper Abdomen: The liver is cirrhotic in appearance. Musculoskeletal: Multilevel degenerative changes are present throughout the thoracic spine. Review of the MIP images confirms the above findings. IMPRESSION: 1. No evidence of pulmonary embolism. 2. Mild to moderate severity bibasilar atelectasis and/or infiltrate. 3. Small, nonhemorrhagic right pleural effusion. 4. Cirrhotic liver. 5. 4 mm and 5 mm noncalcified right middle lobe pulmonary nodules. No follow-up needed if patient is low-risk (and has no known or suspected primary neoplasm). Non-contrast chest CT can be considered in 12 months if patient is high-risk. This recommendation follows the consensus statement: Guidelines for Management of Incidental Pulmonary Nodules Detected on CT Images: From the Fleischner Society 2017; Radiology 2017; 284:228-243. 6. Aortic atherosclerosis. Electronically Signed   By: Suzen Dials M.D.   On: 06/17/2024 14:47   CT Lumbar Spine Wo Contrast Result Date: 06/17/2024 EXAM: CT OF THE LUMBAR SPINE WITHOUT CONTRAST 06/17/2024 12:46:42 PM TECHNIQUE: CT of the lumbar spine was performed without the administration of intravenous contrast. Multiplanar reformatted images are provided for review. Automated exposure control, iterative reconstruction, and/or weight based adjustment of the mA/kV was utilized to reduce the radiation dose to as low as reasonably achievable. COMPARISON: Thoracic spine reported separately today. Lumbar radiographs 01/15/2011. CLINICAL HISTORY: 89 year old female with moderate-severe head trauma from a fall while on blood thinners, and posterior head laceration. FINDINGS: This study is mildly motion degraded. BONES AND ALIGNMENT: Normal lumbar  segmentation, concordant with the thoracic numbering today. Osteopenia. Lumbar vertebral height and lordosis are within normal limits. Lower thoracic levels are reported separately today. Visible sacrum and SI joints appear intact.  No acute osseous abnormality. DEGENERATIVE CHANGES: Advanced lower lumbar spine degeneration L3-L4 through L5-S1 with severe disc space loss, vacuum disc, moderate and severe posterior element hypertrophy. Subsequently there is CT evidence of severe multifactorial spinal stenosis at both L3-L4 (series 9 image 77) and L4-L5 (series 9 image 92). SOFT TISSUES: Advanced calcified aortoiliac atherosclerosis. Otherwise negative visible non-contrast abdominal viscera. Lower lumbar paraspinal muscle atrophy.  IMPRESSION: 1. Mildly motion degraded, No acute traumatic injury identified in the lumbar spine.  2. Advanced lower lumbar degeneration with severe spinal stenosis at L3-L4 and L4-L5. Electronically signed by: Helayne Hurst MD 06/17/2024 01:08 PM EST RP Workstation: [id number]   CT Thoracic Spine Wo Contrast Result Date: 06/17/2024 EXAM: CT THORACIC SPINE WITHOUT CONTRAST 06/17/2024 12:46:42 PM TECHNIQUE: CT of the thoracic spine was performed without the administration of intravenous contrast. Multiplanar reformatted images are provided for review. Automated exposure control, iterative reconstruction, and/or weight based adjustment of the mA/kV was utilized to reduce the radiation dose to as low as reasonably achievable. COMPARISON: 2 view chest radiographs 08/21/2023. CLINICAL HISTORY: 89 year old female with back trauma, no prior imaging. Head trauma, moderate-severe, fall while on blood thinners, posterior head laceration. FINDINGS: This exam is mildly motion degraded. Thoracic segmentation appears to be normal. BONES AND ALIGNMENT: Mildly exaggerated thoracic kyphosis in the setting of chronic T7 and T11 compression fractures which appear stable from last year. Normal vertebral body  heights except for chronic T7 and T11 compression fractures. No acute fracture or suspicious bone lesion. Grossly intact visible posterior ribs. SOFT TISSUES: Partially visible cardiac pacemaker leads. Advanced calcified aortic atherosclerosis. Combined patchy and confluent bilateral lower lobe peribronchial and dependent opacity plus trace layering right pleural effusion. Right lower lobe appearance indeterminate for atelectasis versus infection. Negative non-contrast thoracic paraspinal soft tissues. Visible upper abdominal viscera. DEGENERATIVE: Age appropriate thoracic spine degeneration. No high grade thoracic spinal stenosis by CT. IMPRESSION: 1. No acute thoracic osseous abnormality. Stable chronic T7 and T11 compression fractures. 2. Right > left lower lobe opacities, indeterminate for atelectasis versus infection. Trace right pleural effusion. Electronically signed by: Helayne Hurst MD 06/17/2024 01:04 PM EST RP Workstation: [id number]   CT HEAD WO CONTRAST Result Date: 06/17/2024 EXAM: CT HEAD WITHOUT CONTRAST 06/17/2024 12:46:42 PM TECHNIQUE: CT of the head was performed without the administration of intravenous contrast. Automated exposure control, iterative reconstruction, and/or weight based adjustment of the mA/kV was utilized to reduce the radiation dose to as low as reasonably achievable. COMPARISON: Head CT 10/29/2023 and 11/06/2023. CLINICAL HISTORY: 89 year old female with head trauma, moderate-severe, fall while on blood thinners, and posterior head laceration. FINDINGS: BRAIN AND VENTRICLES: No acute hemorrhage. No evidence of acute infarct. No hydrocephalus. No extra-axial collection. No mass effect or midline shift. Stable brain volume. Patchy chronic cerebral white matter hypodensity. Stable gray-white differentiation. No suspicious intracranial vascular hyperdensity. Advanced calcified atherosclerosis at the skull base. ORBITS: No acute abnormality. SINUSES: Chronic left maxillary sinus  disease with progressive bilateral paranasal sinus opacification since last year, but appears most likely inflammatory. Tympanic cavities and mastoids remain well aerated. SOFT TISSUES AND SKULL: Left posterior convexity broad based scalp soft tissue swelling and irregularity with some areas of scalp soft tissue gas indicating laceration. Underlying skull appears stable and intact. IMPRESSION: 1. Left posterior convexity scalp soft tissue injury. 2. No acute intracranial abnormality identified. 3. Progressive paranasal sinus opacification, favor inflammatory rather than post-traumatic. Electronically signed by: Helayne Hurst MD 06/17/2024 12:58 PM EST RP Workstation: [id number]   DG Pelvis Portable Result Date: 06/17/2024  CLINICAL DATA:  [facility]. EXAM: PORTABLE PELVIS 1-2 VIEWS COMPARISON:  Pelvis CT dated 11/06/2023 and right femur radiographs dated 01/17/2024. FINDINGS: Again demonstrated is hardware fixation of a previously demonstrated comminuted right intertrochanteric fracture. Unchanged position and alignment. No acute fracture or dislocation. Multiple gallstones in the gallbladder measuring up to 10 mm in maximum diameter each without correction for magnification. Lower lumbar spine degenerative changes. IMPRESSION: 1. No acute fracture or dislocation. 2. Cholelithiasis. Electronically Signed   By: Elspeth Bathe M.D.   On: 06/17/2024 12:15   DG Chest Port 1 View Result Date: 06/17/2024 CLINICAL DATA:  [facility]. EXAM: PORTABLE CHEST 1 VIEW COMPARISON:  11/11/2023 FINDINGS: Stable enlarged cardiac silhouette. Tortuous and partially calcified thoracic aorta. No significant change in linear atelectasis or scarring at the left lung base. Interval mild patchy density at the right lung base. Stable small right mid upper lung zone calcified granuloma. Left subclavian bipolar pacemaker leads in satisfactory position. Moderate right glenohumeral degenerative changes. IMPRESSION: 1. Interval mild patchy atelectasis,  pneumonia or pulmonary hemorrhage at the right lung base. 2. Stable cardiomegaly. Electronically Signed   By: Elspeth Bathe M.D.   On: 06/17/2024 12:12    EKG:   Independently reviewed.  Orders placed or performed during the hospital encounter of 06/17/24   ED EKG   ED EKG   EKG 12-Lead   EKG 12-Lead   EKG 12-Lead   ---------------------------------------------------------------------------------------------------------------------------------------    Assessment / Plan:   Principal Problem:   Acute hypoxic respiratory failure (HCC) Active Problems:   Paroxysmal atrial fibrillation (HCC)   Fall   Essential hypertension, benign   Brady-tachy syndrome (HCC)   Hypothyroidism   Pacemaker   Cough   GERD (gastroesophageal reflux disease)   Congestive heart failure (HCC)   CKD (chronic kidney disease) stage 3, GFR 30-59 ml/min (HCC)   Skin tear of lower leg without complication   Chronic anticoagulation   Aortic stenosis   Slow transit constipation   Status post fall   Assessment and Plan: * Acute hypoxic respiratory failure (HCC) Acute hypoxic respiratory failure-likely viral upper respiratory infection, sinusitis -New requirement for O2, 2-3 L, currently satting 93% -Patient is afebrile with no leukocytosis, will obtaining inflammatory markers including procalcitonin, and lactic acid level -Obtaining upper respiratory viral panel - ED initiated empiric antibiotic Rocephin /doxycycline  for possible sinusitis-will continue with doxycycline  -Initiating DuoNeb bronchodilator treatments -Short course of IV steroids -Encouraging spirometer, flutter valve -Will wean off O2  Imaging were reviewed in detail: Finding may be consistent with sinusitis, Also:Right > left lower lobe opacities, indeterminate for atelectasis versus infection. Trace right pleural effusion The patient remained afebrile with no leukocytosis Will monitor closely  Upper respiratory  infection-cough/sinusitis Recent upper respiratory infection, -Possible sinusitis based on imaging, right greater than left lower lobe opacity cannot totally rule out pneumonia -patient remained afebrile, no leukocytosis -Patient received Rocephin /doxycycline  in ED Will continue with doxycycline  -Mucolytic's,   Status post fall, recurrent fall, unsteady gait With posterior scalp laceration Traumatic head injury -all imaging reviewed in detail no acute fractures, or intracranial hemorrhage Unsteady gait at baseline -Fall precautions, -Consulting PT OT for evaluation and recommendations -Per daughter at baseline only ambulates with assist, high risk for fall - Holding Eliquis , monitoring posterior scalp hematoma - Status post staples to posterior scalp laceration by ED  Paroxysmal atrial fibrillation (HCC) Continue home medication of amiodarone , pacemaker in place -On Eliquis , holding tonight's dose -restarting in a.m. (After evaluation of her scalp hematoma)  Skin tear of lower leg without  complication Accidental skin tear lower extremity superficial, no signs of bleeding, dressing in place Will monitor continue changing dressing per nursing  CKD (chronic kidney disease) stage 3, GFR 30-59 ml/min (HCC) Monitoring kidney function closely -BUN/creatinine at baseline -Avoiding nephrotoxins  Congestive heart failure (HCC) No signs of overt volume overload -Avoiding further IV fluid resuscitation -Holding Lasix  tonight, resuming in a.m.  Last echo April 2025, EF greater than 75% LV hyperdynamic function, severe LVH, grade 2 diastolic dysfunction, RV function within normal limits moderately dilated left atria, mild MR calcified aortic valve  GERD (gastroesophageal reflux disease) Continue PPI   Pacemaker Noted With history of sick sinus syndrome, pacemaker in place, No complication, heart rate well-controlled   Hypothyroidism Continue home dose Synthroid   Brady-tachy  syndrome (HCC) Heart rate steady-stable -Pacemaker in place -Continue follow-up with cardiologist, No changes in current medications - Continue amiodarone , restarting Eliquis  in a.m.  Essential hypertension, benign Blood pressure remained stable, resuming home medication Medication including Lasix , amiodarone  will be started in a.m. Will monitor blood pressure, orthostatic BP checks  Slow transit constipation Continue bowel regimen  Aortic stenosis History of chronic aortic stenosis, -Denies any chest pain -Monitoring, supportive care  Chronic anticoagulation With holding patient's Eliquis  tonight for posterior scalp hematoma -Reevaluating and reinitiating in a.m.       Ethics - POA: daughter at bedside, confirmed DNR/DNI status,   onsults called: PT/OT -------------------------------------------------------------------------------------------------------------------------------------------- DVT prophylaxis:  SCDs Start: 06/17/24 1740 apixaban  (ELIQUIS ) tablet 5 mg   Code Status:   Code Status: Do not attempt resuscitation (DNR) PRE-ARREST INTERVENTIONS DESIRED   Admission status: Patient will be admitted as Observation, with a greater than 2 midnight length of stay. Level of care: Med-Surg   Family Communication:  none at bedside  (The above findings and plan of care has been discussed with patient in detail, the patient expressed understanding and agreement of above plan)  --------------------------------------------------------------------------------------------------------------------------------------------------  Disposition Plan:  Anticipated 1-2 days Status is: Observation The patient remains OBS appropriate and will d/c before 2 midnights.     ----------------------------------------------------------------------------------------------------------------------------------------------------  Time spent:  19  Min.  Was spent seeing and evaluating the  patient, reviewing all medical records, drawn plan of care.  SIGNED: Adriana DELENA Grams, MD, FHM. FAAFP.  - [facility], Pager  (Please use [email or web address] to page/ or secure chat through epic) If 7PM-7AM, please contact night-coverage [email or web address],  06/17/2024, 6:38 PM  "

## 2024-06-17 NOTE — Assessment & Plan Note (Signed)
 Continue home medication of amiodarone , pacemaker in place -On Eliquis , holding tonight's dose -restarting in a.m. (After evaluation of her scalp hematoma)

## 2024-06-17 NOTE — ED Notes (Signed)
 Wound care performed on skin tear to R shin. Non adherent pad, bacitracin, and kerlix.

## 2024-06-17 NOTE — Assessment & Plan Note (Signed)
 Recurrent falls, traumatic head injury -Fall precautions, -Consulting PT OT for evaluation and recommendations -Per daughter at baseline only ambulates with assist, high risk for fall

## 2024-06-18 ENCOUNTER — Encounter (HOSPITAL_COMMUNITY): Payer: Self-pay | Admitting: Family Medicine

## 2024-06-18 DIAGNOSIS — J9601 Acute respiratory failure with hypoxia: Secondary | ICD-10-CM | POA: Diagnosis not present

## 2024-06-18 LAB — MAGNESIUM: Magnesium: 1.8 mg/dL (ref 1.7–2.4)

## 2024-06-18 LAB — LACTIC ACID, PLASMA: Lactic Acid, Venous: 1 mmol/L (ref 0.5–1.9)

## 2024-06-18 LAB — C-REACTIVE PROTEIN: CRP: 1.8 mg/dL — ABNORMAL HIGH

## 2024-06-18 LAB — APTT: aPTT: 37 s — ABNORMAL HIGH (ref 24–36)

## 2024-06-18 LAB — PHOSPHORUS: Phosphorus: 3.6 mg/dL (ref 2.5–4.6)

## 2024-06-18 LAB — SEDIMENTATION RATE: Sed Rate: 25 mm/h — ABNORMAL HIGH (ref 0–22)

## 2024-06-18 LAB — PROCALCITONIN: Procalcitonin: <0.1 ng/mL

## 2024-06-18 MED ORDER — OXYCODONE HCL 5 MG PO TABS: 5.0000 mg | ORAL_TABLET | Freq: Four times a day (QID) | ORAL | 0 refills | Status: AC | PRN

## 2024-06-18 MED ORDER — LEVALBUTEROL HCL 1.25 MG/0.5ML IN NEBU: 1.2500 mg | INHALATION_SOLUTION | Freq: Four times a day (QID) | RESPIRATORY_TRACT | Status: DC | PRN

## 2024-06-18 MED ORDER — DOXYCYCLINE HYCLATE 100 MG PO TABS: 100.0000 mg | ORAL_TABLET | Freq: Two times a day (BID) | ORAL | Status: AC

## 2024-06-18 MED ORDER — MUPIROCIN 2 % EX OINT
TOPICAL_OINTMENT | Freq: Two times a day (BID) | CUTANEOUS | Status: DC
  Filled 2024-06-18: qty 22

## 2024-06-18 MED ORDER — CEFUROXIME AXETIL 250 MG PO TABS
250.0000 mg | ORAL_TABLET | Freq: Two times a day (BID) | ORAL | Status: DC
  Filled 2024-06-18: qty 1

## 2024-06-18 MED ORDER — CEFUROXIME AXETIL 250 MG PO TABS: 250.0000 mg | ORAL_TABLET | Freq: Two times a day (BID) | ORAL | Status: AC

## 2024-06-18 NOTE — [place] FL2 (Signed)
 " [place]  MEDICAID FL2 LEVEL OF CARE FORM     IDENTIFICATION  Patient Name: [name] Birthdate: 1927/06/29 Sex: female Admission Date (Current Location): 06/17/2024  [facility] and [place] Number:  Producer, Television/film/video and Address:  [facility], [address] [place], [place] [address]      Provider Number: [id number]  Attending Physician Name and Address:  Drusilla Sabas RAMAN, MD  Relative Name and Phone Number:  Lonell Moore/daughter: [phone]    Current Level of Care: Hospital Recommended Level of Care: Skilled Nursing Facility Prior Approval Number:    Date Approved/Denied:   PASRR Number: [id number]  Discharge Plan: SNF    Current Diagnoses: Patient Active Problem List   Diagnosis Date Noted   Acute hypoxic respiratory failure (HCC) 06/17/2024   Status post fall 06/17/2024   Conjunctivitis 04/28/2024   Pressure ulcer of left heel 03/13/2024   Recurrent depression 03/04/2024   Pressure ulcer of right heel 12/12/2023   Skin tear of lower leg without complication 11/19/2023   Right hip pain 11/19/2023   IDA (iron  deficiency anemia) 11/19/2023   Slow transit constipation 11/19/2023   Closed displaced intertrochanteric fracture of right femur, initial encounter (HCC) 11/06/2023   NSTEMI (non-ST elevated myocardial infarction) (HCC) 09/14/2023   BRBPR (bright red blood per rectum) 05/24/2023   COVID-19 virus infection 03/11/2023   Contusion of great toe of left foot 01/13/2023   Numbness of fingers of both hands 09/18/2022   Fall 08/24/2022   UTI (urinary tract infection) 07/23/2022   Major neurocognitive disorder (HCC) 06/29/2022   Pain in left foot 06/29/2022   PAD (peripheral artery disease) 03/30/2022   Mild cognitive impairment 03/26/2022   Osteoarthritis, multiple sites 03/23/2022   Chronic pain of both shoulders 03/09/2022   Candidiasis of skin 03/09/2022   CKD (chronic kidney disease) stage 3, GFR 30-59 ml/min (HCC) 02/22/2022    Cerebral microvascular disease 02/19/2022   Hypokalemia, inadequate intake 02/19/2022   Cellulitis of left anterior lower leg 02/15/2022   Aortic valve disorder 10/19/2021   Congestive heart failure (HCC) 10/19/2021   Eczema 10/19/2021   Other specified disorders of bone density and structure, other site 10/19/2021   History of colonic polyps 10/19/2021   Pure hypertriglyceridemia 10/19/2021   Urinary incontinence 10/19/2021   Sinus node dysfunction (HCC) 10/19/2021   Esophageal dysphagia 10/19/2021   Secondary hypercoagulable state 12/30/2019   History of total knee replacement, right 10/25/2017   Osteoarthritis of left knee 10/25/2017   Persistent atrial fibrillation (HCC)    Hypertension    Hair loss    Aortic stenosis 03/08/2016   Chronic anticoagulation 12/17/2014   Mitral regurgitation 05/03/2014   Edema leg 04/28/2014   GERD (gastroesophageal reflux disease) 04/28/2014   Fatigue 12/26/2011   Brady-tachy syndrome (HCC)    Hypothyroidism    Paroxysmal atrial fibrillation (HCC)    Pacemaker    Cough    Essential hypertension, benign 07/20/2010    Orientation RESPIRATION BLADDER Height & Weight     Self, Place  O2 (nasal cannula 2L/min) Incontinent Weight: 178 lb 9.2 oz (81 kg) Height:  5' 3 (160 cm)  BEHAVIORAL SYMPTOMS/MOOD NEUROLOGICAL BOWEL NUTRITION STATUS      Incontinent Diet (please refer to dc summary)  AMBULATORY STATUS COMMUNICATION OF NEEDS Skin   Total Care Verbally Other (Comment) (wounds on face, pretibial right, and heel; pressure wound sacrum)  Personal Care Assistance Level of Assistance  Bathing, Feeding, Dressing Bathing Assistance: Maximum assistance Feeding assistance: Limited assistance Dressing Assistance: Maximum assistance     Functional Limitations Info  Sight, Hearing, Speech Sight Info: Adequate Hearing Info: Adequate Speech Info: Adequate    SPECIAL CARE FACTORS FREQUENCY                        Contractures Contractures Info: Not present    Additional Factors Info  Allergies, Code Status Code Status Info: DNR Allergies Info: Morphine and Codeine, penicillins, aspirin , clindamycin/lincomycin, crestor , diltiazem , erythromycin, lisinopril, metronidazole, morphine, penicillins cross reactors           Current Medications (06/18/2024):  This is the current hospital active medication list Current Facility-Administered Medications  Medication Dose Route Frequency Provider Last Rate Last Admin   acetaminophen  (TYLENOL ) tablet 650 mg  650 mg Oral Q6H PRN Willette Jest A, MD   650 mg at 06/17/24 2047   Or   acetaminophen  (TYLENOL ) suppository 650 mg  650 mg Rectal Q6H PRN Shahmehdi, Jest LABOR, MD       amiodarone  (PACERONE ) tablet 200 mg  200 mg Oral Daily Shahmehdi, Seyed A, MD   200 mg at 06/17/24 2159   apixaban  (ELIQUIS ) tablet 5 mg  5 mg Oral BID Shahmehdi, Seyed A, MD       artificial tears ophthalmic solution 1 drop  1 drop Both Eyes PRN Marten Peat M, RPH       brimonidine  (ALPHAGAN ) 0.2 % ophthalmic solution 1 drop  1 drop Right Eye BID Marten Peat M, RPH   1 drop at 06/17/24 2303   And   timolol  (TIMOPTIC ) 0.5 % ophthalmic solution 1 drop  1 drop Right Eye BID Marten Peat HERO, RPH   1 drop at 06/17/24 2303   calcium -vitamin D  (OSCAL WITH D) 500-5 MG-MCG per tablet 1 tablet  1 tablet Oral Daily Merilee Linsey I, RPH   1 tablet at 06/17/24 2048   cefTRIAXone  (ROCEPHIN ) 1 g in sodium chloride  0.9 % 100 mL IVPB  1 g Intravenous Q24H Willette Jest A, MD   Stopped at 06/17/24 2201   doxycycline  (VIBRAMYCIN ) 100 mg in sodium chloride  0.9 % 250 mL IVPB  100 mg Intravenous Q12H HarrisLavanda, PA-C 125 mL/hr at 06/18/24 0505 100 mg at 06/18/24 0505   escitalopram  (LEXAPRO ) tablet 5 mg  5 mg Oral Daily Shahmehdi, Seyed A, MD       furosemide  (LASIX ) tablet 20 mg  20 mg Oral Daily Shahmehdi, Seyed A, MD       guaiFENesin  (ROBITUSSIN) 100 MG/5ML liquid 10 mL  10 mL Oral  Q8H Shahmehdi, Seyed A, MD   10 mL at 06/18/24 0505   hydrALAZINE  (APRESOLINE ) injection 10 mg  10 mg Intravenous Q4H PRN Shahmehdi, Seyed A, MD       HYDROmorphone  (DILAUDID ) injection 0.5-1 mg  0.5-1 mg Intravenous Q2H PRN Shahmehdi, Seyed A, MD       levalbuterol  (XOPENEX ) nebulizer solution 1.25 mg  1.25 mg Nebulization Q6H PRN Drusilla Sabas RAMAN, MD       levothyroxine  (SYNTHROID ) tablet 137 mcg  137 mcg Oral Q0600 Shahmehdi, Seyed A, MD   137 mcg at 06/18/24 0509   melatonin tablet 5 mg  5 mg Oral QHS Shahmehdi, Seyed A, MD   5 mg at 06/17/24 2200   memantine  (NAMENDA ) tablet 10 mg  10 mg Oral Daily Willette Jest LABOR, MD  methylPREDNISolone  sodium succinate (SOLU-MEDROL ) 40 mg/mL injection 40 mg  40 mg Intravenous Q24H Shahmehdi, Seyed A, MD       multivitamin with minerals tablet 1 tablet  1 tablet Oral q AM Shahmehdi, Seyed A, MD   1 tablet at 06/18/24 9490   mupirocin  ointment (BACTROBAN ) 2 %   Topical BID Lama, Gagan S, MD       ondansetron  (ZOFRAN ) tablet 4 mg  4 mg Oral Q6H PRN Shahmehdi, Seyed A, MD       Or   ondansetron  (ZOFRAN ) injection 4 mg  4 mg Intravenous Q6H PRN Shahmehdi, Seyed A, MD       oxyCODONE  (Oxy IR/ROXICODONE ) immediate release tablet 5 mg  5 mg Oral Q4H PRN Shahmehdi, Seyed A, MD   5 mg at 06/18/24 0509   senna-docusate (Senokot-S) tablet 1 tablet  1 tablet Oral QHS Shahmehdi, Seyed A, MD   1 tablet at 06/17/24 2200   sodium chloride  flush (NS) 0.9 % injection 3 mL  3 mL Intravenous Q12H Shahmehdi, Seyed A, MD   3 mL at 06/17/24 2200     Discharge Medications: Please see discharge summary for a list of discharge medications.  Relevant Imaging Results:  Relevant Lab Results:   Additional Information SSN: [id number]  Sherline Clack, LCSWA     "

## 2024-06-18 NOTE — Care Management Obs Status (Signed)
 MEDICARE OBSERVATION STATUS NOTIFICATION   Patient Details  Name: [name] MRN: [id number] Date of Birth: 13-Dec-1927   Medicare Observation Status Notification Given:  Yes Verbally reviewed observation notice with Darice Rundle telephonically at [phone].  Will mail a copy to the patient home address.     Hulan Szumski 06/18/2024, 2:03 PM

## 2024-06-18 NOTE — Progress Notes (Signed)
 1440 called report to [facility] and given to nurse. Phone number given to nurse if any questions. PTAR has been called.

## 2024-06-18 NOTE — TOC Initial Note (Signed)
 Transition of Care [facility]) - Initial/Assessment Note    Patient Details  Name: [name] MRN: [id number] Date of Birth: February 02, 1928  [facility] of [facility]) CM/SW Contact:    Sherline Clack, LCSWA Phone Number: 06/18/2024, 11:20 AM  Clinical Narrative:                  Patient is a resident at [facility]. CSW spoke with Hospital Doctor at [facility] and patient is able to return to the facility when medically ready. Anticipated discharge is today, per provider. Facility requested an updated FL2 prior to dc, CSW has completed FL2 and sent to facility through [facility].   Expected Discharge Plan: Skilled Nursing Facility Barriers to Discharge: No Barriers Identified   Patient Goals and CMS Choice     Choice offered to / list presented to : Patient, Adult Children      Expected Discharge Plan and Services       Living arrangements for the past 2 months: Skilled Nursing Facility                                      Prior Living Arrangements/Services Living arrangements for the past 2 months: Skilled Nursing Facility Lives with:: Facility Resident                   Activities of Daily Living   ADL Screening (condition at time of admission) Independently performs ADLs?: No Does the patient have a NEW difficulty with bathing/dressing/toileting/self-feeding that is expected to last >3 days?: No Does the patient have a NEW difficulty with getting in/out of bed, walking, or climbing stairs that is expected to last >3 days?: No Does the patient have a NEW difficulty with communication that is expected to last >3 days?: No Is the patient deaf or have difficulty hearing?: Yes Does the patient have difficulty seeing, even when wearing glasses/contacts?: No Does the patient have difficulty concentrating, remembering, or making decisions?: Yes  Permission Sought/Granted                  Emotional Assessment Appearance:: Appears stated  age Attitude/Demeanor/Rapport: Unable to Assess Affect (typically observed): Unable to Assess Orientation: : Oriented to Self, Oriented to Place      Admission diagnosis:  Acute respiratory failure with hypoxia (HCC) [J96.01] Injury of head, initial encounter [S09.90XA] Fall, initial encounter [W19.XXXA] Acute hypoxic respiratory failure (HCC) [J96.01] Patient Active Problem List   Diagnosis Date Noted   Acute hypoxic respiratory failure (HCC) 06/17/2024   Status post fall 06/17/2024   Conjunctivitis 04/28/2024   Pressure ulcer of left heel 03/13/2024   Recurrent depression 03/04/2024   Pressure ulcer of right heel 12/12/2023   Skin tear of lower leg without complication 11/19/2023   Right hip pain 11/19/2023   IDA (iron  deficiency anemia) 11/19/2023   Slow transit constipation 11/19/2023   Closed displaced intertrochanteric fracture of right femur, initial encounter (HCC) 11/06/2023   NSTEMI (non-ST elevated myocardial infarction) (HCC) 09/14/2023   BRBPR (bright red blood per rectum) 05/24/2023   COVID-19 virus infection 03/11/2023   Contusion of great toe of left foot 01/13/2023   Numbness of fingers of both hands 09/18/2022   Fall 08/24/2022   UTI (urinary tract infection) 07/23/2022   Major neurocognitive disorder (HCC) 06/29/2022   Pain in left foot 06/29/2022   PAD (peripheral artery disease) 03/30/2022   Mild cognitive impairment 03/26/2022  Osteoarthritis, multiple sites 03/23/2022   Chronic pain of both shoulders 03/09/2022   Candidiasis of skin 03/09/2022   CKD (chronic kidney disease) stage 3, GFR 30-59 ml/min (HCC) 02/22/2022   Cerebral microvascular disease 02/19/2022   Hypokalemia, inadequate intake 02/19/2022   Cellulitis of left anterior lower leg 02/15/2022   Aortic valve disorder 10/19/2021   Congestive heart failure (HCC) 10/19/2021   Eczema 10/19/2021   Other specified disorders of bone density and structure, other site 10/19/2021   History of  colonic polyps 10/19/2021   Pure hypertriglyceridemia 10/19/2021   Urinary incontinence 10/19/2021   Sinus node dysfunction (HCC) 10/19/2021   Esophageal dysphagia 10/19/2021   Secondary hypercoagulable state 12/30/2019   History of total knee replacement, right 10/25/2017   Osteoarthritis of left knee 10/25/2017   Persistent atrial fibrillation (HCC)    Hypertension    Hair loss    Aortic stenosis 03/08/2016   Chronic anticoagulation 12/17/2014   Mitral regurgitation 05/03/2014   Edema leg 04/28/2014   GERD (gastroesophageal reflux disease) 04/28/2014   Fatigue 12/26/2011   Brady-tachy syndrome (HCC)    Hypothyroidism    Paroxysmal atrial fibrillation [facility])    Pacemaker    Cough    Essential hypertension, benign 07/20/2010   PCP:  Charlanne Fredia CROME, MD Pharmacy:   [facility] - [place], [place] - [address] [place] [address] Phone: [phone] Fax: [phone]  [facility] - [place], [place] - [address] [place] [phone] Phone: [phone] Fax: [phone]  [facility] - [place], [place] - [place] [address] [place] [address] Phone: [phone] Fax: [phone]     Social Drivers of Health (SDOH) Social History: SDOH Screenings   Food Insecurity: No Food Insecurity (06/17/2024)  Housing: Low Risk (06/17/2024)  Transportation Needs: No Transportation Needs (06/17/2024)  Utilities: Not At Risk (06/17/2024)  Depression (PHQ2-9): Low Risk (04/09/2023)  Social Connections: Moderately Integrated (06/17/2024)  Tobacco Use: Medium Risk (06/18/2024)   SDOH Interventions:     Readmission Risk Interventions     No data to display

## 2024-06-18 NOTE — Plan of Care (Signed)

## 2024-06-18 NOTE — ED Provider Notes (Signed)
" ° °  Procedures  .Laceration Repair  Date/Time: 06/18/2024 7:14 AM  Performed by: Mannie Fairy DASEN, DO Authorized by: Mannie Fairy DASEN, DO   Consent:    Consent obtained:  Verbal   Consent given by:  Patient   Risks discussed:  Infection and pain Laceration details:    Location:  Scalp   Length (cm):  6 Treatment:    Irrigation solution:  Sterile saline Skin repair:    Repair method:  Staples   Number of staples:  9 Repair type:    Repair type:  Simple Comments:     9 staples placed          Mannie Fairy T, DO 06/18/24 0715  "

## 2024-06-18 NOTE — Evaluation (Signed)
 Occupational Therapy Evaluation Patient Details Name: [name] MRN: [id number] DOB: June 13, 1927 Today's Date: 06/18/2024   History of Present Illness   Pt is a 89 y.o. female presents to [facility] ED on 06/18/23 following a fall in which pt hit her head and sustained laceration on posterior head requiring staples; no loss of consciousness in fall. Pt's daughter also reports upper respiratory symptoms over the past week with pt testing negative for both flu and COVID. PMH: displaced intertrochanteric fx of R hip s/p R hip cephulomedullary nail on 11/07/23, moderate to severe aortic stenosis, sick sinus syndrome status post pacemaker placement, paroxysmal A-fib on Eliquis , hypertension, hypothyroidism, senile dementia     Clinical Impressions At baseline, pt completes UB ADLs with Set up to Contact guard assist, LB ADLs with Max to Total assist in sit<->stand, functional transfers with Min-Mod assist +1, and functional mobility with a manual w/c with Set up-Supervision. Pt with baseline diagnosis of dementia with pt at baseline and able to follow 1-step commands and participating well in session. Pt now presents with decreased activity tolerance, decreased balance, pain affecting functional level, increase fear of falling, and decreased safety and independence with functional tasks. Pt largely completing UB ADLs in sitting with Set up to Min assist, LB ADLs with Total assist from bed level, and bed mobility with Mod-Max assist. Pt with HR in the 60s throughout session. Pt O2 sat initially at 88% to 89% on RA at rest at beginning of session with O2 sat stable with PLB. Placed pt on 1L continuous O2 through nasal cannula with O2 sat increasing to 93% and staying at 92% to 93% on 1L. Pt back on RA at end of session with O2 sat at 92% at rest. Pt will benefit from acute skilled OT services to address deficits and increase safety and independence with functional tasks. Post acute discharge, pt will benefit from  intensive inpatient skilled rehab services < 3 hours per day to maximize rehab potential      If plan is discharge home, recommend the following:   Two people to help with walking and/or transfers;A lot of help with bathing/dressing/bathroom;Assistance with cooking/housework;Direct supervision/assist for medications management;Direct supervision/assist for financial management;Assist for transportation;Help with stairs or ramp for entrance;Supervision due to cognitive status     Functional Status Assessment   Patient has had a recent decline in their functional status and demonstrates the ability to make significant improvements in function in a reasonable and predictable amount of time.     Equipment Recommendations   Other (comment) (defer to next level of care)     Recommendations for Other Services         Precautions/Restrictions   Precautions Precautions: Fall Recall of Precautions/Restrictions: Impaired Restrictions Weight Bearing Restrictions Per Provider Order: No     Mobility Bed Mobility Overal bed mobility: Needs Assistance Bed Mobility: Rolling, Supine to Sit, Sit to Supine Rolling: Max assist, Used rails   Supine to sit: Mod assist, Max assist, Used rails, HOB elevated Sit to supine: Max assist   General bed mobility comments: Max assist from bring B LE in/out of bed; Mod assist to elevate trunk into sitting    Transfers Overall transfer level: Needs assistance                 General transfer comment: deferred this session due to pain and pt with increased fear of falling 2/2 fall leading to this admission; recommend +2 for further assessment next session  Balance Overall balance assessment: Needs assistance Sitting-balance support: Single extremity supported, Bilateral upper extremity supported, No upper extremity supported, Feet supported Sitting balance-Leahy Scale: Poor (Poor to Fair) Sitting balance - Comments: initially  requiring Contact guard assist for static sitting with intermittent mild posterior lean, but fading to close Supervision; unable to maintain balance reaching outside base of support Postural control: Posterior lean (mild)                                 ADL either performed or assessed with clinical judgement   ADL Overall ADL's : Needs assistance/impaired Eating/Feeding: Set up;Bed level   Grooming: Set up;Contact guard assist;Bed level;Cueing for sequencing   Upper Body Bathing: Minimal assistance;Sitting;Cueing for sequencing   Lower Body Bathing: Total assistance;Bed level   Upper Body Dressing : Contact guard assist;Minimal assistance;Sitting;Cueing for sequencing   Lower Body Dressing: Total assistance;Bed level     Toilet Transfer Details (indicate cue type and reason): deferred this session due to pain and pt with increased fear of falling 2/2 fall leading to this admission; recommend +2 for further assessment next session Toileting- Clothing Manipulation and Hygiene: Total assistance;Bed level         General ADL Comments: Pt with decreased activity tolerance     Vision Baseline Vision/History: 1 Wears glasses Ability to See in Adequate Light: 0 Adequate (with glasses on) Patient Visual Report: No change from baseline Additional Comments: Vision [facility] for tasks assessed; not formally assessed     Perception         Praxis         Pertinent Vitals/Pain Pain Assessment Pain Assessment: Faces Faces Pain Scale: Hurts even more Pain Location: R LE at ankle, lower back Pain Descriptors / Indicators: Aching, Discomfort, Grimacing, Guarding Pain Intervention(s): Limited activity within patient's tolerance, Monitored during session, Repositioned     Extremity/Trunk Assessment Upper Extremity Assessment Upper Extremity Assessment: Right hand dominant (gross B UE strength 4/5, AROM WFL)   Lower Extremity Assessment Lower Extremity Assessment: Defer to  PT evaluation       Communication Communication Communication: No apparent difficulties   Cognition Arousal: Alert Behavior During Therapy: WFL for tasks assessed/performed Cognition: History of cognitive impairments             OT - Cognition Comments: Dementia at baseline. Pt pleasant and participating well throughout session.                 Following commands: Impaired Following commands impaired: Only follows one step commands consistently, Follows multi-step commands inconsistently, Follows multi-step commands with increased time     Cueing  General Comments   Cueing Techniques: Verbal cues;Gestural cues;Visual cues;Tactile cues  Pt with HR in the 60s throughout session. Pt O2 sat initially at 88% to 89% on RA at rest at beginning of session with O2 sat stable with PLB. Placed pt on 1L continuous O2 through nasal cannula with O2 sat increasing to 93% and staying at 92% to 93% on 1L. Pt back on RA at end of session with O2 sat at 92% at rest. Pt's daughter and son-in-law present and supportive throughout session. RN present at end of session.   Exercises     Shoulder Instructions      Home Living Family/patient expects to be discharged to:: Skilled nursing facility  Additional Comments: Pt is a LTC resident of [facility]. History per pt's daughter.      Prior Functioning/Environment Prior Level of Function : Needs assist             Mobility Comments: Pt uses a w/c at baseline and transfers with Min-Mod assist +1. Per daughter, pt received PT at [facility] after fall with hip fx in 10/2023, but has not received any PT services recently.  ADLs Comments: At baseline, she performs UB ADLs with Set up to Contact guard assist and LB ADLs with Max to Total assist. Pt wears incontinece briefs at baseline. 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 Per daughter, pt received OT  at [facility] after fall with hip fx in 10/2023, but has not received any OT services recently.     OT Problem List: Decreased activity tolerance;Impaired balance (sitting and/or standing);Decreased safety awareness;Decreased knowledge of precautions;Decreased knowledge of use of DME or AE;Pain   OT Treatment/Interventions: Self-care/ADL training;Energy conservation;DME and/or AE instruction;Therapeutic exercise;Therapeutic activities;Cognitive remediation/compensation;Visual/perceptual remediation/compensation;Patient/family education;Balance training      OT Goals(Current goals can be found in the care plan section)   Acute Rehab OT Goals Patient Stated Goal: to return home and not fall anymore OT Goal Formulation: With patient/family Time For Goal Achievement: 07/02/24 Potential to Achieve Goals: Good ADL Goals Pt Will Perform Grooming: with set-up;sitting;with supervision Pt Will Perform Upper Body Bathing: with supervision;sitting Pt Will Perform Lower Body Bathing: with max assist;sit to/from stand Pt Will Perform Upper Body Dressing: sitting Pt Will Perform Lower Body Dressing: with max assist;sit to/from stand Pt Will Transfer to Toilet: with mod assist;with max assist;stand pivot transfer;regular height toilet;grab bars (with least restrictive AD)   OT Frequency:  Min 1X/week    Co-evaluation              AM-PAC OT 6 Clicks Daily Activity     Outcome Measure Help from another person eating meals?: A Little Help from another person taking care of personal grooming?: A Little Help from another person toileting, which includes using toliet, bedpan, or urinal?: Total Help from another person bathing (including washing, rinsing, drying)?: A Lot Help from another person to put on and taking off regular upper body clothing?: A Little Help from another person to put on and taking off regular lower body clothing?: Total 6 Click Score: 13   End of Session Equipment  Utilized During Treatment: Oxygen Nurse Communication: Mobility status;Other (comment) (O2 sats. OT POC. PT planning to eval pt shortly.)  Activity Tolerance: Patient tolerated treatment well;Patient limited by pain Patient left: in bed;with call bell/phone within reach;with bed alarm set;with family/visitor present  OT Visit Diagnosis: Other abnormalities of gait and mobility (R26.89);History of falling (Z91.81);Repeated falls (R29.6);Pain                Time: 8965-8879 OT Time Calculation (min): 46 min Charges:  OT General Charges $OT Visit: 1 Visit OT Evaluation $OT Eval Moderate Complexity: 1 Mod OT Treatments $Self Care/Home Management : 23-37 mins  [name] Rockey HERO., OTR/L, MA Acute Rehab [phone]   [name] FORBES Horns 06/18/2024, 12:11 PM

## 2024-06-18 NOTE — Evaluation (Signed)
 Physical Therapy Evaluation and Discharge Patient Details Name: [name] MRN: [id number] DOB: 25-Mar-1928 Today's Date: 06/18/2024  History of Present Illness  Pt is a 89 y.o. female [facility] ED on [facility]/18/23 following a fall in which pt hit her head and sustained laceration on posterior head requiring staples; no loss of consciousness in fall. Pt's daughter also reports upper respiratory symptoms over the past week with pt testing negative for both flu and COVID. PMH: displaced intertrochanteric fx of R hip s/p R hip cephulomedullary nail on 11/07/23, moderate to severe aortic stenosis, sick sinus syndrome status post pacemaker placement, paroxysmal A-fib on Eliquis , hypertension, hypothyroidism, senile dementia  Clinical Impression  Pt admitted with above diagnosis. Notified pt ready for d/c back to SNF after PT evaluation. She was able to roll with min assist, rise to EOB with mod assist and rail use, and return to supine with max assist for trunk and LE support. Stood with Mod assist, bil UEs supported by therapist and and took lateral steps along bed with mod assist for balance, sequencing, and weight shift techniques. Was able to stand pivot transfer with wall rail PTA per family. Spends all day in w/c. Discussed with family pressure relief schedule, techniques, and cushion recs. Should be further evaluated by SNF. Adequate from a PT standpoint for return to SNF for continued rehab after fall once medically stable.         If plan is discharge home, recommend the following: A lot of help with walking and/or transfers;A lot of help with bathing/dressing/bathroom;Assistance with cooking/housework;Direct supervision/assist for medications management;Direct supervision/assist for financial management;Assist for transportation;Help with stairs or ramp for entrance;Supervision due to cognitive status   Can travel by private vehicle   No    Equipment Recommendations None recommended by PT   Recommendations for Other Services       Functional Status Assessment Patient has had a recent decline in their functional status and demonstrates the ability to make significant improvements in function in a reasonable and predictable amount of time.     Precautions / Restrictions Precautions Precautions: Fall Recall of Precautions/Restrictions: Impaired Restrictions Weight Bearing Restrictions Per Provider Order: No      Mobility  Bed Mobility Overal bed mobility: Needs Assistance Bed Mobility: Rolling, Sidelying to Sit, Sit to Sidelying Rolling: Used rails, Min assist Sidelying to sit: Mod assist, Used rails, HOB elevated     Sit to sidelying: Max assist General bed mobility comments: Min assist to roll and bridge to scoot up in bed. Educated on log roll technique due to back pain. A little assist for LEs to EOB. Mod assist to rise pulling through therapist's hand and max assist to return to sidelying for trunk support, LE lift and heavy cues for technique.    Transfers Overall transfer level: Needs assistance Equipment used: 1 person hand held assist Transfers: Sit to/from Stand Sit to Stand: Mod assist           General transfer comment: Mod assist with bil knee block and bil hands supported by therapist facing pt. Able to rise and progress with lateral steps along bed with mod assist for balance, facilitate weight shift, and sequencing of steps throughout.    Ambulation/Gait                  Stairs            Wheelchair Mobility     Tilt Bed    Modified Rankin (Stroke Patients Only)  Balance Overall balance assessment: Needs assistance Sitting-balance support: Feet supported, Single extremity supported Sitting balance-Leahy Scale: Poor (Poor to Fair) Sitting balance - Comments: Required up to min assist for balance initially, progressing to CGA Postural control: Posterior lean (mild)                                    Pertinent Vitals/Pain Pain Assessment Faces Pain Scale: Hurts even more Pain Location: R LE at ankle, lower back Pain Descriptors / Indicators: Aching, Discomfort, Grimacing, Guarding    Home Living Family/patient expects to be discharged to:: Skilled nursing facility                   Additional Comments: Pt is a LTC resident of [facility]. History per pt's daughter.    Prior Function Prior Level of Function : Needs assist             Mobility Comments: Pt uses a w/c at baseline and transfers with Min-Mod assist +1. ADLs Comments: At baseline, she performs UB ADLs with Set up to Contact guard assist and LB ADLs with Max to Total assist. Pt wears incontinece briefs at baseline. 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     Extremity/Trunk Assessment   Upper Extremity Assessment Upper Extremity Assessment: Defer to OT evaluation    Lower Extremity Assessment Lower Extremity Assessment: Generalized weakness       Communication   Communication Communication: No apparent difficulties    Cognition Arousal: Alert Behavior During Therapy: WFL for tasks assessed/performed                             Following commands: Impaired Following commands impaired: Only follows one step commands consistently, Follows multi-step commands inconsistently, Follows multi-step commands with increased time     Cueing Cueing Techniques: Verbal cues, Gestural cues, Visual cues, Tactile cues     General Comments General comments (skin integrity, edema, etc.): Pt with HR in the 60s throughout session. Pt O2 sat initially at 88% to 89% on RA at rest at beginning of session with O2 sat stable with PLB. Placed pt on 1L continuous O2 through nasal cannula with O2 sat increasing to 93% and staying at 92% to 93% on 1L. Pt back on RA at end of session with O2 sat at 92% at rest. Pt's daughter and son-in-law present and supportive  throughout session. RN present at end of session.    Exercises     Assessment/Plan    PT Assessment All further PT needs can be met in the next venue of care  PT Problem List Decreased strength;Decreased range of motion;Decreased balance;Decreased activity tolerance;Decreased mobility;Pain;Decreased skin integrity;Obesity;Decreased knowledge of precautions;Decreased safety awareness;Decreased knowledge of use of DME;Decreased cognition       PT Treatment Interventions      PT Goals (Current goals can be found in the Care Plan section)  Acute Rehab PT Goals Patient Stated Goal: Get well PT Goal Formulation: All assessment and education complete, DC therapy    Frequency       Co-evaluation               AM-PAC PT 6 Clicks Mobility  Outcome Measure Help needed turning from your back to your side while in a flat bed without using bedrails?: A Little Help needed  moving from lying on your back to sitting on the side of a flat bed without using bedrails?: A Lot Help needed moving to and from a bed to a chair (including a wheelchair)?: A Lot Help needed standing up from a chair using your arms (e.g., wheelchair or bedside chair)?: A Lot Help needed to walk in hospital room?: Total Help needed climbing 3-5 steps with a railing? : Total 6 Click Score: 11    End of Session   Activity Tolerance: Patient tolerated treatment well Patient left: in bed;with call bell/phone within reach;with bed alarm set;with family/visitor present   PT Visit Diagnosis: Unsteadiness on feet (R26.81);Other abnormalities of gait and mobility (R26.89);Muscle weakness (generalized) (M62.81);History of falling (Z91.81);Difficulty in walking, not elsewhere classified (R26.2);Pain Pain - part of body:  (back)    Time: 1151-1209 PT Time Calculation (min) (ACUTE ONLY): 18 min   Charges:   PT Evaluation $PT Eval Moderate Complexity: 1 Mod   PT General Charges $$ ACUTE PT VISIT: 1 Visit          Leontine Roads, PT, DPT [facility]  Rehabilitation Services Physical Therapist Office: [phone] Website: [email or web address]   Leontine GORMAN Roads 06/18/2024, 12:24 PM

## 2024-06-18 NOTE — Consult Note (Signed)
 WOC Nurse Consult Note: Reason for Consult: assess wounds  Wound type: 1.  Deep Tissue Pressure Injury sacrum medial aspect (do note bruising as well lateral aspect but clearly has DTPI medial) purple maroon discoloration some hemorrhagic tissue forming  2.  Partial thickness R lower leg pink moist skin tear d/t trauma  3.  Full thickness R posterior heel ? R/t pressure or trauma from rubbing of shoe dry brown nonviable tissue  4  Full thickness posterior scalp laceration that has been stapled in ER  Pressure Injury POA: Yes sacrum only  Measurement: see nursing flowsheet  Wound bed: as above  Drainage (amount, consistency, odor) appear serosanguinous  Periwound: as mentioned noted ecchymosis to lateral sacrum  Dressing procedure/placement/frequency:  Cleanse medial sacrum DTPI with soap and water, dry and apply Xeroform gauze Soila [phone]) to wound bed daily and secure with silicone foam.  Cleanse R lower leg wound with NS, apply Xeroform gauze Soila [phone]) to wound bed daily and secure with silicone foam. Soak dressing with NS if adhered to wound bed for atraumatic removal.  Paint R posterior heel wound with Betadine 2 times daily and allow to air dry. Cover with silicone foam.  Cleanse scalp wound with NS, apply Bactroban  ointment to stapled wound bed 2 times daily.   POC discussed with bedside nurse. WOC team will not follow. Reconsult if further needs arise.   Thank you,    Powell Bar MSN, RN-BC, [employer]

## 2024-06-18 NOTE — TOC Transition Note (Signed)
 Transition of Care [facility]) - Discharge Note   Patient Details  Name: [name] MRN: [id number] Date of Birth: 05/22/1928  Transition of Care [facility]) CM/SW Contact:  Sherline Clack, LCSWA Phone Number: 06/18/2024, 1:24 PM   Clinical Narrative:     Patient will DC to: [facility] Anticipated DC date: 06/18/2024  Family notified: daughter Lonell via phone, daughter at bedside Transport by: [facility]   Per MD patient ready for DC to [facility]. RN to call report prior to discharge [phone], ext [phone] for [facility]). RN, patient, patient's family, and facility notified of DC. Discharge Summary and FL2 sent to facility. DC packet on chart. Ambulance transport requested for patient.  CSW will sign off for now as social work intervention is no longer needed. Please consult us  again if new needs arise.    Final next level of care: Skilled Nursing Facility Barriers to Discharge: No Barriers Identified   Patient Goals and CMS Choice     Choice offered to / list presented to : Patient, Adult Children      Discharge Placement              Patient chooses bed at: [facility] [place] Patient to be transferred to facility by: [facility] Name of family member notified: Jackie/daughter Patient and family notified of of transfer: 06/18/24  Discharge Plan and Services Additional resources added to the After Visit Summary for                                       Social Drivers of Health (SDOH) Interventions SDOH Screenings   Food Insecurity: No Food Insecurity (06/17/2024)  Housing: Low Risk (06/17/2024)  Transportation Needs: No Transportation Needs (06/17/2024)  Utilities: Not At Risk (06/17/2024)  Depression (PHQ2-9): Low Risk (04/09/2023)  Social Connections: Moderately Integrated (06/17/2024)  Tobacco Use: Medium Risk (06/18/2024)     Readmission Risk Interventions     No data to display

## 2024-06-18 NOTE — Discharge Summary (Signed)
 " Physician Discharge Summary   Patient: [name] MRN: [id number] DOB: August 02, 1927  Admit date:     06/17/2024  Discharge date: 06/18/2024  Discharge Physician: Sabas GORMAN Brod   PCP: Charlanne Fredia CROME, MD   Recommendations at discharge:   Patient to be discharged with cefuroxime  and doxycycline  for 3 days Remove scalp staples after 7 days Check orthostatics at skilled nursing facility every day for next 3 days Place TED hose to prevent orthostatic hypotension  Discharge Diagnoses: Principal Problem:   Acute hypoxic respiratory failure (HCC) Active Problems:   Paroxysmal atrial fibrillation (HCC)   Fall   Essential hypertension, benign   Brady-tachy syndrome (HCC)   Hypothyroidism   Pacemaker   Cough   GERD (gastroesophageal reflux disease)   Congestive heart failure (HCC)   CKD (chronic kidney disease) stage 3, GFR 30-59 ml/min (HCC)   Skin tear of lower leg without complication   Chronic anticoagulation   Aortic stenosis   Slow transit constipation   Status post fall  Resolved Problems:   * No resolved hospital problems. *  Hospital Course:  [name] is a 89 year old female with extensive history of A-fib on Eliquis , CHF, aortic stenosis, sick sinus syndrome, s/p pacemaker, GERD, dementia, HTN, hypothyroidism,.... Presented to ED from skilled nursing facility-post accidental fall.  Patient apparently had an accidental fall in about 2, and hit the back of her head.  Mainly posterior aspect, with no loss of consciousness.  Patient has dementia, very poor historian but pleasant.  Recalls that she had fallen.  Daughter present at bedside.  Reports had a fall and broke her hip earlier this past year since then she has been rehab unsteady gait uses a walker-she got up without assist to go to the bathroom and fell and hit her head.  Confirmed no loss of consciousness no other trauma.  Daughter also reports that for the past week or so she had an upper respiratory symptoms, was  tested for flu and COVID was negative.        Assessment and Plan: * Acute hypoxic respiratory failure (HCC) -Resolved, in setting of viral bronchitis -Weaned off to room air -Will discharge on cefuroxime  and doxycycline  for 3 more days  Fall Recurrent falls, traumatic head injury -Fall precautions, - Patient is wheelchair-bound  Paroxysmal atrial fibrillation (HCC) Continue home medication of amiodarone , pacemaker in place - Continue Eliquis   Skin tear of lower leg without complication Accidental skin tear lower extremity superficial, no signs of bleeding, dressing in place Wound care consulted, will continue with wound care recommendations.  CKD (chronic kidney disease) stage 3, GFR 30-59 ml/min (HCC) Monitoring kidney function closely -BUN/creatinine at baseline   Congestive heart failure (HCC) No signs of overt volume overload - Continue Lasix   Last echo April 2025, EF greater than 75% LV hyperdynamic function, severe LVH, grade 2 diastolic dysfunction, RV function within normal limits moderately dilated left atria, mild MR calcified aortic valve  GERD (gastroesophageal reflux disease) Continue PPI    Pacemaker Noted With history of sick sinus syndrome, pacemaker in place, No complication, heart rate well-controlled   Hypothyroidism Continue home dose Synthroid   Brady-tachy syndrome (HCC) Heart rate steady-stable -Pacemaker in place -Continue follow-up with cardiologist, No changes in current medications - Continue amiodarone , Eliquis   Essential hypertension, benign Blood pressure remained stable, resuming home medication Medication including Lasix , amiodarone  will be started in a.m. -Will start TED hose    Aortic stenosis History of chronic aortic stenosis, -Denies any chest pain -  Monitoring, supportive care  Chronic anticoagulation With holding patient's Eliquis  tonight for posterior scalp hematoma -Reevaluating and reinitiating in  a.m.  Status post fall Status post accidental fall, unsteady on on her gait Was trying to ambulate without delivering assist -Fall in the bathroom, hit her head, posterior scalp laceration          Consultants:  Procedures performed:  Disposition: Skilled nursing facility Diet recommendation:  Regular diet DISCHARGE MEDICATION: Allergies as of 06/18/2024       Reactions   Morphine And Codeine Other (See Comments)   Patient states that she went crazy with this   Penicillins Swelling, Rash   Has patient had a PCN reaction causing immediate rash, facial/tongue/throat swelling, SOB or lightheadedness with hypotension: No Has patient had a PCN reaction causing severe rash involving mucus membranes or skin necrosis: No Has patient had a PCN reaction that required hospitalization No Has patient had a PCN reaction occurring within the last 10 years: No If all of the above answers are NO, then may proceed with Cephalosporin use.   Aspirin     Pacemaker   Clindamycin/lincomycin Other (See Comments)   Any meds with mycin in the name Unknown reaction Not listed on the [facility]   Crestor  [rosuvastatin ]    Other reaction(s): muscle pain Not listed on the [facility]   Diltiazem     Skin discoloration, lower extremity swelling Not listed on the [facility]   Erythromycin Other (See Comments)   Any meds with mycin in the name Unknown reaction Not listed on the [facility]   Lisinopril Other (See Comments)   REACTION: Cough   Metronidazole    Other reaction(s): mouth sores, tongue swelling   Morphine    Penicillins Cross Reactors Other (See Comments)   Any meds with mycin in the name Unknown reaction        Medication List     TAKE these medications    acetaminophen  325 MG tablet Commonly known as: TYLENOL  Take 650 mg by mouth See admin instructions. 2 tablets by mouth two times daily and 2 tablets every 4 hours as needed for pain or fever   amiodarone  200 MG tablet Commonly known as:  PACERONE  Take 200 mg by mouth daily.   apixaban  5 MG Tabs tablet Commonly known as: Eliquis  Take 1 tablet (5 mg total) by mouth 2 (two) times daily.   Calcium  600+D Plus Minerals 600-400 MG-UNIT Tabs Take 1 tablet by mouth daily.   cefUROXime  250 MG tablet Commonly known as: CEFTIN  Take 1 tablet (250 mg total) by mouth 2 (two) times daily with a meal for 3 days.   Combigan  0.2-0.5 % ophthalmic solution Generic drug: brimonidine -timolol  Place 1 drop into the right eye every 12 (twelve) hours.   doxycycline  100 MG tablet Commonly known as: VIBRA -TABS Take 1 tablet (100 mg total) by mouth 2 (two) times daily for 3 days.   escitalopram  5 MG tablet Commonly known as: LEXAPRO  Take 5 mg by mouth daily.   furosemide  20 MG tablet Commonly known as: LASIX  TAKE ONE TABLET BY MOUTH EVERY DAY   HM Lidocaine  Patch 4 % Generic drug: lidocaine  Place 1 patch onto the skin in the morning. Apply topically to L shoulder in the morning. Apply for 12 hours (0900) then removed for 12 hours (2100).   levothyroxine  137 MCG tablet Commonly known as: SYNTHROID  Take 137 mcg by mouth daily before breakfast.   loperamide 2 MG tablet Commonly known as: IMODIUM A-D Take 2 mg by mouth 3 (three)  times daily as needed for diarrhea or loose stools.   melatonin 5 MG Tabs Take 1 tablet (5 mg total) by mouth at bedtime.   Multivitamin Adult Tabs Take 1 tablet by mouth in the morning.   Namenda  10 MG tablet Generic drug: memantine  TAKE ONE TABLET BY MOUTH TWICE A DAY FOR COGNITIVE DECLINE   naphazoline-pheniramine 0.025-0.3 % ophthalmic solution Commonly known as: NAPHCON-A Place 1 drop into both eyes 2 (two) times daily as needed for eye irritation (Q12 hours as needed).   OCUVITE PO Take 1 capsule by mouth in the morning.   ondansetron  4 MG tablet Commonly known as: ZOFRAN  Take 4 mg by mouth every 8 (eight) hours as needed for nausea or vomiting.   oxyCODONE  5 MG immediate release  tablet Commonly known as: Roxicodone  Take 1 tablet (5 mg total) by mouth every 6 (six) hours as needed for severe pain (pain score 7-10).   polyethylene glycol 17 g packet Commonly known as: MIRALAX  / GLYCOLAX  Take 17 g by mouth every other day.   potassium chloride  10 MEQ tablet Commonly known as: KLOR-CON  M Take 1 tablet (10 mEq total) by mouth daily.   Prevagen 10 MG Caps Generic drug: Apoaequorin Take 10 mg by mouth daily.   sennosides-docusate sodium  8.6-50 MG tablet Commonly known as: SENOKOT-S Take 1 tablet by mouth daily.   Systane Hydration PF 0.4-0.3 % Soln Generic drug: Polyethyl Glyc-Propyl Glyc PF Place 1 drop into the right eye 4 (four) times daily.   vitamin C 1000 MG tablet Take 1,000 mg by mouth daily. For 7 days               Discharge Care Instructions  (From admission, onward)           Start     Ordered   06/18/24 0000  Discharge wound care:       Comments: Wound care  2 times daily      Comments: 1.        Paint R posterior heel wound with Betadine 2 times daily and allow to air dry. Cover with silicone foam.  2.         Cleanse scalp wound with NS, apply Bactroban  ointment to stapled wound bed 2 times daily.  May leave open to air or cover with gauze or silicone foam whichever works if draining.    Wound care  Daily      Comments: 1.  Cleanse medial sacrum DTPI with soap and water, dry and apply Xeroform gauze Soila [phone]) to wound bed daily and secure with silicone foam.  2.Cleanse R lower leg wound with NS, apply Xeroform gauze Soila [phone]) to wound bed daily and secure with silicone foam. Soak dressing with NS if adhered to wound bed for atraumatic removal.   Remove staples from the scalp wound after 7 days   06/18/24 1203            Discharge Exam: Filed Weights   06/17/24 1155  Weight: 81 kg   General-appears in no acute distress Heart-S1-S2, regular, no murmur auscultated Lungs-clear to auscultation bilaterally, no  wheezing or crackles auscultated Abdomen-soft, nontender, no organomegaly Extremities-no edema in the lower extremities Neuro-alert, oriented x3, no focal deficit noted  Condition at discharge: good  The results of significant diagnostics from this hospitalization (including imaging, microbiology, ancillary and laboratory) are listed below for reference.   Imaging Studies: CT CERVICAL SPINE WO CONTRAST Result Date: 06/17/2024 EXAM: CT CERVICAL SPINE WITHOUT CONTRAST 06/17/2024  12:46:42 PM TECHNIQUE: CT of the cervical spine was performed without the administration of intravenous contrast. Multiplanar reformatted images are provided for review. Automated exposure control, iterative reconstruction, and/or weight based adjustment of the mA/kV was utilized to reduce the radiation dose to as low as reasonably achievable. COMPARISON: CT cervical spine 11/06/2023. CLINICAL HISTORY: Polytrauma, blunt. FINDINGS: BONES AND ALIGNMENT: No acute fracture or traumatic malalignment of the cervical spine. Overall, similar trace degenerative changes retrolisthesis of C4 on C5. There is diffuse osseous demineralization. DEGENERATIVE CHANGES: There is multilevel degenerative intervertebral disc height loss. There are multilevel disc bulges/disc osteophyte complexes and posterior ligamentum flavum thickening contributing to possible spinal cord impingement at C3-C4, C4-C5, and C5-C6, with associated moderate spinal canal stenosis. This appears grossly similar to prior exam though is not completely evaluated by CT. There is multilevel uncovertebral spurring and facet arthropathy with neural foraminal stenosis most pronounced at the right C4-C5, left C5-C6 and left C6-C7 levels, similar to prior exam. SOFT TISSUES: No prevertebral soft tissue swelling. Opacification of the bilateral maxillary sinuses is partially evaluated. Mucosal thickening right maxillary sinus. Opacification of left sphenoid sinus. Left chest wall cardiac  device. Atherosclerotic calcifications. IMPRESSION: 1. No acute fracture or traumatic malalignment of the cervical spine. 2. Since 11/06/2023, overall similar though partially evaluated by CT is moderate spinal canal stenosis from C3 through C6, which would be better evaluated by MRI. 3. Opacification of the bilateral maxillary and left sphenoid sinuses, correlate for sinusitis. Electronically signed by: Prentice Spade MD 06/17/2024 03:29 PM EST RP Workstation: [id number]   CT Angio Chest PE W and/or Wo Contrast Result Date: 06/17/2024 CLINICAL DATA:  Possible pulmonary hemorrhage. EXAM: CT ANGIOGRAPHY CHEST WITH CONTRAST TECHNIQUE: Multidetector CT imaging of the chest was performed using the standard protocol during bolus administration of intravenous contrast. Multiplanar CT image reconstructions and MIPs were obtained to evaluate the vascular anatomy. RADIATION DOSE REDUCTION: This exam was performed according to the departmental dose-optimization program which includes automated exposure control, adjustment of the mA and/or kV according to patient size and/or use of iterative reconstruction technique. CONTRAST:  75mL OMNIPAQUE  IOHEXOL  350 MG/ML SOLN COMPARISON:  None Available. FINDINGS: Cardiovascular: There is moderate to marked severity calcification of the aortic arch and descending thoracic aorta. A multi lead AICD is in place. Satisfactory opacification of the pulmonary arteries to the segmental level. No evidence of pulmonary embolism. Normal heart size. No pericardial effusion. Mediastinum/Nodes: A solitary subcentimeter calcified right hilar lymph node is present. Thyroid  gland, trachea, and esophagus demonstrate no significant findings. Lungs/Pleura: A 3 mm calcified pulmonary nodule is seen within the lateral aspect of the right upper lobe (axial CT image 56, CT series 14). 4 mm and 5 mm noncalcified pulmonary nodules are seen within the lateral aspect of the right middle lobe (axial CT image 93  and 98, CT series 14). Mild to moderate severity areas of atelectasis and/or infiltrate are seen within the posterior aspects of the bilateral lung bases. There is a small, nonhemorrhagic right pleural effusion. No pneumothorax is identified. Upper Abdomen: The liver is cirrhotic in appearance. Musculoskeletal: Multilevel degenerative changes are present throughout the thoracic spine. Review of the MIP images confirms the above findings. IMPRESSION: 1. No evidence of pulmonary embolism. 2. Mild to moderate severity bibasilar atelectasis and/or infiltrate. 3. Small, nonhemorrhagic right pleural effusion. 4. Cirrhotic liver. 5. 4 mm and 5 mm noncalcified right middle lobe pulmonary nodules. No follow-up needed if patient is low-risk (and has no known or suspected primary neoplasm). Non-contrast chest  CT can be considered in 12 months if patient is high-risk. This recommendation follows the consensus statement: Guidelines for Management of Incidental Pulmonary Nodules Detected on CT Images: From the Fleischner Society 2017; Radiology 2017; 284:228-243. 6. Aortic atherosclerosis. Electronically Signed   By: Suzen Dials M.D.   On: 06/17/2024 14:47   CT Lumbar Spine Wo Contrast Result Date: 06/17/2024 EXAM: CT OF THE LUMBAR SPINE WITHOUT CONTRAST 06/17/2024 12:46:42 PM TECHNIQUE: CT of the lumbar spine was performed without the administration of intravenous contrast. Multiplanar reformatted images are provided for review. Automated exposure control, iterative reconstruction, and/or weight based adjustment of the mA/kV was utilized to reduce the radiation dose to as low as reasonably achievable. COMPARISON: Thoracic spine reported separately today. Lumbar radiographs 01/15/2011. CLINICAL HISTORY: 89 year old female with moderate-severe head trauma from a fall while on blood thinners, and posterior head laceration. FINDINGS: This study is mildly motion degraded. BONES AND ALIGNMENT: Normal lumbar segmentation,  concordant with the thoracic numbering today. Osteopenia. Lumbar vertebral height and lordosis are within normal limits. Lower thoracic levels are reported separately today. Visible sacrum and SI joints appear intact. No acute osseous abnormality. DEGENERATIVE CHANGES: Advanced lower lumbar spine degeneration L3-L4 through L5-S1 with severe disc space loss, vacuum disc, moderate and severe posterior element hypertrophy. Subsequently there is CT evidence of severe multifactorial spinal stenosis at both L3-L4 (series 9 image 77) and L4-L5 (series 9 image 92). SOFT TISSUES: Advanced calcified aortoiliac atherosclerosis. Otherwise negative visible non-contrast abdominal viscera. Lower lumbar paraspinal muscle atrophy. IMPRESSION: 1. Mildly motion degraded, No acute traumatic injury identified in the lumbar spine. 2. Advanced lower lumbar degeneration with severe spinal stenosis at L3-L4 and L4-L5. Electronically signed by: Helayne Hurst MD 06/17/2024 01:08 PM EST RP Workstation: [id number]   CT Thoracic Spine Wo Contrast Result Date: 06/17/2024 EXAM: CT THORACIC SPINE WITHOUT CONTRAST 06/17/2024 12:46:42 PM TECHNIQUE: CT of the thoracic spine was performed without the administration of intravenous contrast. Multiplanar reformatted images are provided for review. Automated exposure control, iterative reconstruction, and/or weight based adjustment of the mA/kV was utilized to reduce the radiation dose to as low as reasonably achievable. COMPARISON: 2 view chest radiographs 08/21/2023. CLINICAL HISTORY: 89 year old female with back trauma, no prior imaging. Head trauma, moderate-severe, fall while on blood thinners, posterior head laceration. FINDINGS: This exam is mildly motion degraded. Thoracic segmentation appears to be normal. BONES AND ALIGNMENT: Mildly exaggerated thoracic kyphosis in the setting of chronic T7 and T11 compression fractures which appear stable from last year. Normal vertebral body heights except for  chronic T7 and T11 compression fractures. No acute fracture or suspicious bone lesion. Grossly intact visible posterior ribs. SOFT TISSUES: Partially visible cardiac pacemaker leads. Advanced calcified aortic atherosclerosis. Combined patchy and confluent bilateral lower lobe peribronchial and dependent opacity plus trace layering right pleural effusion. Right lower lobe appearance indeterminate for atelectasis versus infection. Negative non-contrast thoracic paraspinal soft tissues. Visible upper abdominal viscera. DEGENERATIVE: Age appropriate thoracic spine degeneration. No high grade thoracic spinal stenosis by CT. IMPRESSION: 1. No acute thoracic osseous abnormality. Stable chronic T7 and T11 compression fractures. 2. Right > left lower lobe opacities, indeterminate for atelectasis versus infection. Trace right pleural effusion. Electronically signed by: Helayne Hurst MD 06/17/2024 01:04 PM EST RP Workstation: [id number]   CT HEAD WO CONTRAST Result Date: 06/17/2024 EXAM: CT HEAD WITHOUT CONTRAST 06/17/2024 12:46:42 PM TECHNIQUE: CT of the head was performed without the administration of intravenous contrast. Automated exposure control, iterative reconstruction, and/or weight based adjustment of the mA/kV was utilized  to reduce the radiation dose to as low as reasonably achievable. COMPARISON: Head CT 10/29/2023 and 11/06/2023. CLINICAL HISTORY: 89 year old female with head trauma, moderate-severe, fall while on blood thinners, and posterior head laceration. FINDINGS: BRAIN AND VENTRICLES: No acute hemorrhage. No evidence of acute infarct. No hydrocephalus. No extra-axial collection. No mass effect or midline shift. Stable brain volume. Patchy chronic cerebral white matter hypodensity. Stable gray-white differentiation. No suspicious intracranial vascular hyperdensity. Advanced calcified atherosclerosis at the skull base. ORBITS: No acute abnormality. SINUSES: Chronic left maxillary sinus disease with  progressive bilateral paranasal sinus opacification since last year, but appears most likely inflammatory. Tympanic cavities and mastoids remain well aerated. SOFT TISSUES AND SKULL: Left posterior convexity broad based scalp soft tissue swelling and irregularity with some areas of scalp soft tissue gas indicating laceration. Underlying skull appears stable and intact. IMPRESSION: 1. Left posterior convexity scalp soft tissue injury. 2. No acute intracranial abnormality identified. 3. Progressive paranasal sinus opacification, favor inflammatory rather than post-traumatic. Electronically signed by: Helayne Hurst MD 06/17/2024 12:58 PM EST RP Workstation: [id number]   DG Pelvis Portable Result Date: 06/17/2024 CLINICAL DATA:  Clemens. EXAM: PORTABLE PELVIS 1-2 VIEWS COMPARISON:  Pelvis CT dated 11/06/2023 and right femur radiographs dated 01/17/2024. FINDINGS: Again demonstrated is hardware fixation of a previously demonstrated comminuted right intertrochanteric fracture. Unchanged position and alignment. No acute fracture or dislocation. Multiple gallstones in the gallbladder measuring up to 10 mm in maximum diameter each without correction for magnification. Lower lumbar spine degenerative changes. IMPRESSION: 1. No acute fracture or dislocation. 2. Cholelithiasis. Electronically Signed   By: Elspeth Bathe M.D.   On: 06/17/2024 12:15   DG Chest Port 1 View Result Date: 06/17/2024 CLINICAL DATA:  Clemens. EXAM: PORTABLE CHEST 1 VIEW COMPARISON:  11/11/2023 FINDINGS: Stable enlarged cardiac silhouette. Tortuous and partially calcified thoracic aorta. No significant change in linear atelectasis or scarring at the left lung base. Interval mild patchy density at the right lung base. Stable small right mid upper lung zone calcified granuloma. Left subclavian bipolar pacemaker leads in satisfactory position. Moderate right glenohumeral degenerative changes. IMPRESSION: 1. Interval mild patchy atelectasis, pneumonia or  pulmonary hemorrhage at the right lung base. 2. Stable cardiomegaly. Electronically Signed   By: Elspeth Bathe M.D.   On: 06/17/2024 12:12    Microbiology: Results for orders placed or performed during the hospital encounter of 06/17/24  Resp panel by RT-PCR (RSV, Flu A&B, Covid) Anterior Nasal Swab     Status: None   Collection Time: 06/17/24  5:37 PM   Specimen: Anterior Nasal Swab  Result Value Ref Range Status   SARS Coronavirus 2 by RT PCR NEGATIVE NEGATIVE Final   Influenza A by PCR NEGATIVE NEGATIVE Final   Influenza B by PCR NEGATIVE NEGATIVE Final    Comment: (NOTE) The Xpert Xpress SARS-CoV-2/FLU/RSV plus assay is intended as an aid in the diagnosis of influenza from Nasopharyngeal swab specimens and should not be used as a sole basis for treatment. Nasal washings and aspirates are unacceptable for Xpert Xpress SARS-CoV-2/FLU/RSV testing.  Fact Sheet for Patients: bloggercourse.com  Fact Sheet for Healthcare Providers: seriousbroker.it  This test is not yet approved or cleared by the [place]  FDA and has been authorized for detection and/or diagnosis of SARS-CoV-2 by FDA under an Emergency Use Authorization (EUA). This EUA will remain in effect (meaning this test can be used) for the duration of the COVID-19 declaration under Section 564(b)(1) of the Act, 21 U.S.C. section 360bbb-3(b)(1), unless the authorization is terminated or  revoked.     Resp Syncytial Virus by PCR NEGATIVE NEGATIVE Final    Comment: (NOTE) Fact Sheet for Patients: bloggercourse.com  Fact Sheet for Healthcare Providers: seriousbroker.it  This test is not yet approved or cleared by the [place]  FDA and has been authorized for detection and/or diagnosis of SARS-CoV-2 by FDA under an Emergency Use Authorization (EUA). This EUA will remain in effect (meaning this test can be used) for the  duration of the COVID-19 declaration under Section 564(b)(1) of the Act, 21 U.S.C. section 360bbb-3(b)(1), unless the authorization is terminated or revoked.  Performed at [facility], [address], [place], [place] [address]     Labs: CBC: Recent Labs  Lab 06/17/24 1219  WBC 5.7  HGB 11.7*  HCT 36.2  MCV 100.0  PLT 126*   Basic Metabolic Panel: Recent Labs  Lab 06/17/24 1219 06/18/24 0123  NA 141  --   K 4.2  --   CL 104  --   CO2 28  --   GLUCOSE 110*  --   BUN 15  --   CREATININE 0.99  --   CALCIUM  8.9  --   MG  --  1.8  PHOS  --  3.6   Liver Function Tests: Recent Labs  Lab 06/17/24 1219  AST 36  ALT 21  ALKPHOS 92  BILITOT 0.4  PROT 6.3*  ALBUMIN 3.3*   CBG: No results for input(s): GLUCAP in the last 168 hours.  Discharge time spent: greater than 30 minutes.  Signed: Sabas GORMAN Brod, MD [facility] 06/18/2024 "

## 2024-06-19 ENCOUNTER — Encounter: Payer: Self-pay | Admitting: Nurse Practitioner

## 2024-06-19 ENCOUNTER — Non-Acute Institutional Stay (SKILLED_NURSING_FACILITY): Payer: Self-pay | Admitting: Nurse Practitioner

## 2024-06-19 DIAGNOSIS — I495 Sick sinus syndrome: Secondary | ICD-10-CM | POA: Diagnosis not present

## 2024-06-19 DIAGNOSIS — I4819 Other persistent atrial fibrillation: Secondary | ICD-10-CM | POA: Diagnosis not present

## 2024-06-19 DIAGNOSIS — F339 Major depressive disorder, recurrent, unspecified: Secondary | ICD-10-CM

## 2024-06-19 DIAGNOSIS — F0390 Unspecified dementia without behavioral disturbance: Secondary | ICD-10-CM

## 2024-06-19 DIAGNOSIS — I10 Essential (primary) hypertension: Secondary | ICD-10-CM

## 2024-06-19 DIAGNOSIS — R1319 Other dysphagia: Secondary | ICD-10-CM

## 2024-06-19 DIAGNOSIS — N1831 Chronic kidney disease, stage 3a: Secondary | ICD-10-CM

## 2024-06-19 DIAGNOSIS — S0101XS Laceration without foreign body of scalp, sequela: Secondary | ICD-10-CM

## 2024-06-19 DIAGNOSIS — J9601 Acute respiratory failure with hypoxia: Secondary | ICD-10-CM

## 2024-06-19 NOTE — Progress Notes (Unsigned)
 "                               Location:   SNF [facility] Room Number: 15 Place of Service:  SNF (31) Provider: [facility] Angeles Zehner NP  Charlanne Fredia CROME, MD  Patient Care Team: Charlanne Fredia CROME, MD as PCP - General (Internal Medicine) [name], FNP (Hospice and Palliative Medicine) [name], MD (Inactive) as Consulting Physician (Cardiology) Loni Soyla LABOR, MD as Consulting Physician (Cardiology)  Extended Emergency Contact Information Primary Emergency Contact: [facility] Phone: [phone] Mobile Phone: [phone] Relation: Daughter Interpreter needed? No Secondary Emergency Contact: Pearman,Karen Address: [address]          [place], [place] [address] [place] Home Phone: [phone] Mobile Phone: [phone] Relation: Daughter  Code Status: DNR Goals of care: Advanced Directive information    06/17/2024   10:48 PM  Advanced Directives  Type of Advance Directive Out of facility DNR (pink MOST or yellow form)  Does patient want to make changes to medical advance directive? No - Patient declined     Chief Complaint  Patient presents with   Acute Visit    Follow up hospital stay.     HPI:  Pt is a 89 y.o. female seen today for an acute visit for medication review following hospital stay  Hospitalized 06/17/24-18/26 for acute hypoxic respiratory failure, s/p fall, will complete 3 days Cefuroxime , Doxycycline  in SNF [facility] for R lung base PNA, remove scalp staples 7 days.   Glaucoma, right eye, Combigan  ophthalmic solution             .Edema, not apparent, no increased respiratory symptoms, no O2 desaturation, weight fluctuating, at baseline as well.               Depression scored 2 on recent PHQ-9.  Tolerating Lexapro  well, Na 140 03/12/2024              Hospitalized 11/06/23-11/12/23 for closed displaced intertrochanteric fx of R femur, s/p IM, WBAT. Hospital stay was complicated with acute blood loss anemia and acute respiratory  failure with hypoxia.                BRBPR 05/23/23, FOBT positive 05/24/23, noted hemorrhoids, placed PPI, Hgb 12.8 12/10/23              01/11/23 family expressed concerning of hand and fingers tingling and numbness, desires checking TSH 4.38 01/26/23 If Namenda  is the culprit. The patient stated her tingling and numbness in hands and fingers has been the same, declined workup or neurology referral.              R heel, pressure ulcer, improving, scab over             Chronic numbness of R+L hands, has been using fingerless gloves which helped, declined workup, neurology referral, or medication             Anemia, post op, off Iron , Hgb 12.8 12/10/23             Senile dementia, tolerated Namenda  well, helped with her mood as well. MMSE 27/30 04/09/23, CT head 02/12/22 Mild age-related atrophy and chronic microvascular ischemic changes. Also taking Prevagen.              Afib, off Metoprolol , on Amiodarone , followed by Cardiology, on  Eliquis .  AS moderate             HTN off Metoprolol  05/27/23 2/2 low Bp, on Furosemide ,  f/u Cardiology. Bun/creat 15/0.99 06/17/24             Pacemaker, SSS,  f/u Cardiology             CHF, AS, no apparent edema BLE, on Furosemide . EF 70-75% 09/19/21, 03/27/22 Venous US , negative DVT. 03/30/22 arterial US  mild to moderate obstructive disease             CKD Bun/creat 15/0.99 06/17/24             Dysphagia, nectar liquid.              Hypothyroidism, on Levothyroxine , TSH 2.13 05/05/24             OA, takes Tylenol , ambulates with walker prior to fx of R hip 11/06/23, w/c for mobility now.              GERD, stable, off Pantoprazole.  Hgb 11.7 06/17/24             Constipation, taking Senokot S, MiraLax    Past Medical History:  Diagnosis Date   Aortic stenosis 03/08/2016   Brady-tachy syndrome (HCC)    a. Biotronik dual chamber PPM implanted 2009 b. gen change to STJ dual chamber PPM 2017   Ejection fraction    EF 70%, echo, October, 2009  //   EF 55-60%,  septal dyssynergy consistent with a paced rhythm, mild to moderate mitral regurgitation, echo, November, 2015    GERD (gastroesophageal reflux disease) 04/28/2014   Episodes November, 2015 with fluid refluxing from her esophagus.    Hair loss    Patient questioned Coumadin, changed toPradaxa   Hypertension    Hypothyroidism    Lower extremity edema 01/22/2018   Mitral regurgitation 05/03/2014   Mild-to-moderate mitral regurgitation, echo, November, 2015    Osteoarthritis of left knee 10/25/2017   Paroxysmal atrial fibrillation (HCC)    Episodes rapid atrial fib noted by pacemaker interrogation, August, 2011, diltiazem  added, patient improved. Patient continues on Rythmol   //   Changed to flecainide  2013  //   flecainide  level checked in 2013, good level    Persistent atrial fibrillation (HCC)    Presence of permanent cardiac pacemaker    Past Surgical History:  Procedure Laterality Date   ABDOMINAL HYSTERECTOMY     EP IMPLANTABLE DEVICE N/A 08/08/2015   Generator change with SJM Assurity DR PPM by Dr Kelsie   FEMUR IM NAIL Right 11/07/2023   Procedure: INSERTION, INTRAMEDULLARY ROD, FEMUR, RETROGRADE;  Surgeon: Genelle Standing, MD;  Location: [facility] OR;  Service: Orthopedics;  Laterality: Right;   JOINT REPLACEMENT     PACEMAKER PLACEMENT  2009        Allergies  Allergen Reactions   Morphine And Codeine Other (See Comments)    Patient states that she went crazy with this   Penicillins Swelling and Rash    Has patient had a PCN reaction causing immediate rash, facial/tongue/throat swelling, SOB or lightheadedness with hypotension: {Yes/No:30480221} Has patient had a PCN reaction causing severe rash involving mucus membranes or skin necrosis: {Yes/No:30480221} Has patient had a PCN reaction that required hospitalization {Yes/No:30480221} Has patient had a PCN reaction occurring within the last 10 years: {Yes/No:30480221} If all of the above answers are NO, then may proceed with  Cephalosporin use.   Aspirin      Pacemaker   Clindamycin/Lincomycin Other (See Comments)  Any meds with mycin in the name Unknown reaction Not listed on the [facility]   Crestor  [Rosuvastatin ]     Other reaction(s): muscle pain Not listed on the [facility]   Diltiazem      Skin discoloration, lower extremity swelling Not listed on the [facility]   Erythromycin Other (See Comments)    Any meds with mycin in the name Unknown reaction Not listed on the [facility]   Lisinopril Other (See Comments)    REACTION: Cough   Metronidazole     Other reaction(s): mouth sores, tongue swelling   Morphine    Penicillins Cross Reactors Other (See Comments)    Any meds with mycin in the name Unknown reaction    Allergies as of 06/19/2024       Reactions   Morphine And Codeine Other (See Comments)   Patient states that she went crazy with this   Penicillins Swelling, Rash   Has patient had a PCN reaction causing immediate rash, facial/tongue/throat swelling, SOB or lightheadedness with hypotension: {Yes/No:30480221} Has patient had a PCN reaction causing severe rash involving mucus membranes or skin necrosis: {Yes/No:30480221} Has patient had a PCN reaction that required hospitalization {Yes/No:30480221} Has patient had a PCN reaction occurring within the last 10 years: {Yes/No:30480221} If all of the above answers are NO, then may proceed with Cephalosporin use.   Aspirin     Pacemaker   Clindamycin/lincomycin Other (See Comments)   Any meds with mycin in the name Unknown reaction Not listed on the [facility]   Crestor  [rosuvastatin ]    Other reaction(s): muscle pain Not listed on the [facility]   Diltiazem     Skin discoloration, lower extremity swelling Not listed on the [facility]   Erythromycin Other (See Comments)   Any meds with mycin in the name Unknown reaction Not listed on the [facility]   Lisinopril Other (See Comments)   REACTION: Cough   Metronidazole    Other reaction(s): mouth sores, tongue swelling   Morphine     Penicillins Cross Reactors Other (See Comments)   Any meds with mycin in the name Unknown reaction        Medication List        Accurate as of June 19, 2024  1:30 PM. If you have any questions, ask your nurse or doctor.          acetaminophen  325 MG tablet Commonly known as: TYLENOL  Take 650 mg by mouth See admin instructions. 2 tablets by mouth two times daily and 2 tablets every 4 hours as needed for pain or fever   amiodarone  200 MG tablet Commonly known as: PACERONE  Take 200 mg by mouth daily.   apixaban  5 MG Tabs tablet Commonly known as: Eliquis  Take 1 tablet (5 mg total) by mouth 2 (two) times daily.   Calcium  600+D Plus Minerals 600-400 MG-UNIT Tabs Take 1 tablet by mouth daily.   cefUROXime  250 MG tablet Commonly known as: CEFTIN  Take 1 tablet (250 mg total) by mouth 2 (two) times daily with a meal for 3 days.   Combigan  0.2-0.5 % ophthalmic solution Generic drug: brimonidine -timolol  Place 1 drop into the right eye every 12 (twelve) hours.   doxycycline  100 MG tablet Commonly known as: VIBRA -TABS Take 1 tablet (100 mg total) by mouth 2 (two) times daily for 3 days.   escitalopram  5 MG tablet Commonly known as: LEXAPRO  Take 5 mg by mouth daily.   furosemide  20 MG tablet Commonly known as: LASIX  TAKE ONE TABLET BY MOUTH EVERY DAY   HM  Lidocaine  Patch 4 % Generic drug: lidocaine  Place 1 patch onto the skin in the morning. Apply topically to L shoulder in the morning. Apply for 12 hours (0900) then removed for 12 hours (2100).   levothyroxine  137 MCG tablet Commonly known as: SYNTHROID  Take 137 mcg by mouth daily before breakfast.   loperamide 2 MG tablet Commonly known as: IMODIUM A-D Take 2 mg by mouth 3 (three) times daily as needed for diarrhea or loose stools.   melatonin 5 MG Tabs Take 1 tablet (5 mg total) by mouth at bedtime.   Multivitamin Adult Tabs Take 1 tablet by mouth in the morning.   Namenda  10 MG tablet Generic drug:  memantine  TAKE ONE TABLET BY MOUTH TWICE A DAY FOR COGNITIVE DECLINE   naphazoline-pheniramine 0.025-0.3 % ophthalmic solution Commonly known as: NAPHCON-A Place 1 drop into both eyes 2 (two) times daily as needed for eye irritation (Q12 hours as needed).   OCUVITE PO Take 1 capsule by mouth in the morning.   ondansetron  4 MG tablet Commonly known as: ZOFRAN  Take 4 mg by mouth every 8 (eight) hours as needed for nausea or vomiting.   oxyCODONE  5 MG immediate release tablet Commonly known as: Roxicodone  Take 1 tablet (5 mg total) by mouth every 6 (six) hours as needed for severe pain (pain score 7-10).   polyethylene glycol 17 g packet Commonly known as: MIRALAX  / GLYCOLAX  Take 17 g by mouth every other day.   potassium chloride  10 MEQ tablet Commonly known as: KLOR-CON  M Take 1 tablet (10 mEq total) by mouth daily.   Prevagen 10 MG Caps Generic drug: Apoaequorin Take 10 mg by mouth daily.   sennosides-docusate sodium  8.6-50 MG tablet Commonly known as: SENOKOT-S Take 1 tablet by mouth daily.   Systane Hydration PF 0.4-0.3 % Soln Generic drug: Polyethyl Glyc-Propyl Glyc PF Place 1 drop into the right eye 4 (four) times daily.   vitamin C 1000 MG tablet Take 1,000 mg by mouth daily. For 7 days        Review of Systems  Constitutional:  Positive for fatigue. Negative for appetite change and fever.  HENT:  Positive for hearing loss and trouble swallowing. Negative for congestion.   Eyes:  Negative for visual disturbance.  Respiratory:  Positive for cough. Negative for chest tightness and shortness of breath.   Cardiovascular:  Negative for chest pain, palpitations and leg swelling.  Gastrointestinal:  Negative for abdominal pain and constipation.  Genitourinary:  Negative for dysuria.  Musculoskeletal:  Positive for arthralgias and gait problem.       Knees, L>R, intermittent in nature.  Left shoulder pain with ROM  Skin:  Positive for wound.       Chronic venous  insufficiency skin changes: mild erythema in mid of R+L Right heel pressure ulcer wound, scabbed over RLL skin tear, covered in clean dressing Scalp laceration repair with staples intact, no active bleeding or s/s of infection.    Neurological:  Positive for numbness. Negative for tremors, speech difficulty, weakness and headaches.       Memory lapses. chronic numbness in fingers.   Psychiatric/Behavioral:  Negative for confusion and sleep disturbance. The patient is not nervous/anxious.        May be less smiling than usual upon my examination today?    Immunization History  Administered Date(s) Administered   Fluad Quad(high Dose 65+) 04/02/2022   Fluzone Influenza virus vaccine,trivalent (IIV3), split virus 02/24/2019, 03/08/2020, 03/25/2021   INFLUENZA, HIGH DOSE SEASONAL PF  03/08/2015, 04/03/2017, 03/17/2018, 03/08/2020, 04/15/2023   Influenza Split 03/11/2009, 03/11/2010, 02/25/2011   Influenza,inj,Quad PF,6+ Mos 03/08/2016   Influenza,inj,quad, With Preservative 03/11/2017   Influenza-Unspecified 03/17/2024   Moderna Covid-19 Vaccine  Bivalent Booster 15yrs & up 10/27/2021, 07/11/2023   Moderna SARS-COV2 Booster Vaccination 11/08/2020   Moderna Sars-Covid-2 Vaccination 06/15/2019, 07/13/2019, 04/19/2020, 02/27/2022   PNEUMOCOCCAL CONJUGATE-20 06/27/2023   Pfizer Covid-19 Vaccine Bivalent Booster 62yrs & up 02/28/2021, 04/12/2022   Pneumococcal Conjugate-13 12/06/2014   Pneumococcal Polysaccharide-23 05/13/2001, 03/11/2009   Polio, Unspecified 02/02/1958   RSV,unspecified 03/05/2024   Tdap 11/21/2011, 04/11/2023   Unspecified SARS-COV-2 Vaccination 04/06/2024   Zoster Recombinant(Shingrix) 07/18/2022, 10/19/2022   Pertinent  Health Maintenance Due  Topic Date Due   Influenza Vaccine  Completed   Bone Density Scan  Completed      02/16/2022    9:45 AM 02/27/2022   11:41 AM 07/06/2022   10:04 AM 07/13/2022    3:55 PM 03/25/2023    4:51 PM  Fall Risk  Falls in the past year?    0 0 1  Was there an injury with Fall?   0  0  0   Fall Risk Category Calculator   0 0 1  (RETIRED) Patient Fall Risk Level High fall risk  High fall risk      Patient at Risk for Falls Due to  History of fall(s) No Fall Risks No Fall Risks History of fall(s);Impaired balance/gait;Impaired mobility  Fall risk Follow up  Falls evaluation completed   Falls evaluation completed Falls evaluation completed;Falls prevention discussed;Education provided     Data saved with a previous flowsheet row definition   Functional Status Survey:    Vitals:   06/19/24 1201  BP: (!) 126/43  Pulse: 67  Resp: 18  Temp: (!) 97.3 F (36.3 C)  SpO2: 90%  Weight: 182 lb 12.8 oz (82.9 kg)   Body mass index is 32.38 kg/m. Physical Exam Vitals and nursing note reviewed.  Constitutional:      Appearance: Normal appearance.  HENT:     Head: Normocephalic and atraumatic.     Nose: Nose normal.     Mouth/Throat:     Mouth: Mucous membranes are moist.  Eyes:     Extraocular Movements: Extraocular movements intact.     Pupils: Pupils are equal, round, and reactive to light.  Cardiovascular:     Rate and Rhythm: Normal rate. Rhythm irregular.     Heart sounds: Murmur heard.     Comments: No dorsalis pedis pulses felt from previous examination.  Pulmonary:     Effort: Pulmonary effort is normal.     Breath sounds: No rales.  Abdominal:     General: Bowel sounds are normal.     Palpations: Abdomen is soft.     Tenderness: There is no abdominal tenderness.  Genitourinary:    Comments: External hemorrhoids from previous examination Musculoskeletal:        General: Tenderness present. Normal range of motion.     Cervical back: Normal range of motion and neck supple.     Right lower leg: No edema.     Left lower leg: No edema.     Comments: Trace edema LLE, 1+ edema RLE Left shoulder pain with ROM R hip pain  Skin:    General: Skin is warm and dry.     Findings: Bruising present.     Comments:  Chronic venous insufficiency skin changes: mild erythema in mid of R+L R hip surgical scar Right heel pressure  ulcer wound, scabbed over RLL skin tear, covered in clean dressing Scalp laceration repair with staples intact, no active bleeding or s/s of infection.     Neurological:     General: No focal deficit present.     Mental Status: She is alert. Mental status is at baseline.     Gait: Gait abnormal.     Comments: 5/5 muscle strength in fingers.  Oriented to person, her room on unit.   Psychiatric:        Mood and Affect: Mood normal.        Behavior: Behavior normal.     Comments: Seems less smile than usual upon my examination today.     Labs reviewed: Recent Labs    11/11/23 0505 11/12/23 0608 11/19/23 0000 12/10/23 0000 03/10/24 0000 03/12/24 0000 06/17/24 1219 06/18/24 0123  NA 139 138   < > 138 139 140 141  --   K 3.4* 4.9   < >  --  4.0 4.2 4.2  --   CL 101 102   < > 102 102 102 104  --   CO2 31 31   < > 26* 31* 32* 28  --   GLUCOSE 121* 105*  --   --   --   --  110*  --   BUN 27* 20   < > 16 32* 22* 15  --   CREATININE 0.68 0.70   < > 0.8 0.9 0.7 0.99  --   CALCIUM  8.0* 8.1*   < > 8.9  --  8.8 8.9  --   MG 2.1 2.2  --   --   --   --   --  1.8  PHOS 2.6 2.4*  --   --   --   --   --  3.6   < > = values in this interval not displayed.   Recent Labs    11/11/23 0505 11/12/23 0608 11/19/23 0000 01/07/24 0000 03/10/24 0000 06/17/24 1219  AST 30 32   < > 25 19 36  ALT 16 18   < > 14 15 21   ALKPHOS 36* 38   < > 102 71 92  BILITOT 1.3* 1.7*  --   --   --  0.4  PROT 5.1* 5.2*  --   --   --  6.3*  ALBUMIN 2.6* 2.6*   < > 3.7 3.2* 3.3*   < > = values in this interval not displayed.   Recent Labs    11/12/23 0608 11/19/23 0000 11/22/23 1039 12/10/23 0000 03/10/24 0000 06/17/24 1219  WBC 6.8   < > 7.3 5.4 9.3 5.7  NEUTROABS 3.9  --   --  3,289.00 6,845.00  --   HGB 7.6*   < > 11.4 12.8 12.5 11.7*  HCT 23.8*   < > 36.2 41 39 36.2  MCV 105.3*  --   105*  --   --  100.0  PLT 142*   < > 240 143* 97* 126*   < > = values in this interval not displayed.   Lab Results  Component Value Date   TSH 2.13 05/05/2024   Lab Results  Component Value Date   HGBA1C 4.8 11/08/2023   Lab Results  Component Value Date   CHOL 159 09/14/2023   HDL 42 09/14/2023   LDLCALC 100 (H) 09/14/2023   TRIG 85 09/14/2023   CHOLHDL 3.8 09/14/2023    Significant Diagnostic Results in last 30 days:  CT  CERVICAL SPINE WO CONTRAST Result Date: 06/17/2024 EXAM: CT CERVICAL SPINE WITHOUT CONTRAST 06/17/2024 12:46:42 PM TECHNIQUE: CT of the cervical spine was performed without the administration of intravenous contrast. Multiplanar reformatted images are provided for review. Automated exposure control, iterative reconstruction, and/or weight based adjustment of the mA/kV was utilized to reduce the radiation dose to as low as reasonably achievable. COMPARISON: CT cervical spine 11/06/2023. CLINICAL HISTORY: Polytrauma, blunt. FINDINGS: BONES AND ALIGNMENT: No acute fracture or traumatic malalignment of the cervical spine. Overall, similar trace degenerative changes retrolisthesis of C4 on C5. There is diffuse osseous demineralization. DEGENERATIVE CHANGES: There is multilevel degenerative intervertebral disc height loss. There are multilevel disc bulges/disc osteophyte complexes and posterior ligamentum flavum thickening contributing to possible spinal cord impingement at C3-C4, C4-C5, and C5-C6, with associated moderate spinal canal stenosis. This appears grossly similar to prior exam though is not completely evaluated by CT. There is multilevel uncovertebral spurring and facet arthropathy with neural foraminal stenosis most pronounced at the right C4-C5, left C5-C6 and left C6-C7 levels, similar to prior exam. SOFT TISSUES: No prevertebral soft tissue swelling. Opacification of the bilateral maxillary sinuses is partially evaluated. Mucosal thickening right maxillary sinus.  Opacification of left sphenoid sinus. Left chest wall cardiac device. Atherosclerotic calcifications. IMPRESSION: 1. No acute fracture or traumatic malalignment of the cervical spine. 2. Since 11/06/2023, overall similar though partially evaluated by CT is moderate spinal canal stenosis from C3 through C6, which would be better evaluated by MRI. 3. Opacification of the bilateral maxillary and left sphenoid sinuses, correlate for sinusitis. Electronically signed by: Prentice Spade MD 06/17/2024 03:29 PM EST RP Workstation: [id number]   CT Angio Chest PE W and/or Wo Contrast Result Date: 06/17/2024 CLINICAL DATA:  Possible pulmonary hemorrhage. EXAM: CT ANGIOGRAPHY CHEST WITH CONTRAST TECHNIQUE: Multidetector CT imaging of the chest was performed using the standard protocol during bolus administration of intravenous contrast. Multiplanar CT image reconstructions and MIPs were obtained to evaluate the vascular anatomy. RADIATION DOSE REDUCTION: This exam was performed according to the departmental dose-optimization program which includes automated exposure control, adjustment of the mA and/or kV according to patient size and/or use of iterative reconstruction technique. CONTRAST:  75mL OMNIPAQUE  IOHEXOL  350 MG/ML SOLN COMPARISON:  None Available. FINDINGS: Cardiovascular: There is moderate to marked severity calcification of the aortic arch and descending thoracic aorta. A multi lead AICD is in place. Satisfactory opacification of the pulmonary arteries to the segmental level. No evidence of pulmonary embolism. Normal heart size. No pericardial effusion. Mediastinum/Nodes: A solitary subcentimeter calcified right hilar lymph node is present. Thyroid  gland, trachea, and esophagus demonstrate no significant findings. Lungs/Pleura: A 3 mm calcified pulmonary nodule is seen within the lateral aspect of the right upper lobe (axial CT image 56, CT series 14). 4 mm and 5 mm noncalcified pulmonary nodules are seen within the  lateral aspect of the right middle lobe (axial CT image 93 and 98, CT series 14). Mild to moderate severity areas of atelectasis and/or infiltrate are seen within the posterior aspects of the bilateral lung bases. There is a small, nonhemorrhagic right pleural effusion. No pneumothorax is identified. Upper Abdomen: The liver is cirrhotic in appearance. Musculoskeletal: Multilevel degenerative changes are present throughout the thoracic spine. Review of the MIP images confirms the above findings. IMPRESSION: 1. No evidence of pulmonary embolism. 2. Mild to moderate severity bibasilar atelectasis and/or infiltrate. 3. Small, nonhemorrhagic right pleural effusion. 4. Cirrhotic liver. 5. 4 mm and 5 mm noncalcified right middle lobe pulmonary nodules. No follow-up needed  if patient is low-risk (and has no known or suspected primary neoplasm). Non-contrast chest CT can be considered in 12 months if patient is high-risk. This recommendation follows the consensus statement: Guidelines for Management of Incidental Pulmonary Nodules Detected on CT Images: From the Fleischner Society 2017; Radiology 2017; 284:228-243. 6. Aortic atherosclerosis. Electronically Signed   By: Suzen Dials M.D.   On: 06/17/2024 14:47   CT Lumbar Spine Wo Contrast Result Date: 06/17/2024 EXAM: CT OF THE LUMBAR SPINE WITHOUT CONTRAST 06/17/2024 12:46:42 PM TECHNIQUE: CT of the lumbar spine was performed without the administration of intravenous contrast. Multiplanar reformatted images are provided for review. Automated exposure control, iterative reconstruction, and/or weight based adjustment of the mA/kV was utilized to reduce the radiation dose to as low as reasonably achievable. COMPARISON: Thoracic spine reported separately today. Lumbar radiographs 01/15/2011. CLINICAL HISTORY: 89 year old female with moderate-severe head trauma from a fall while on blood thinners, and posterior head laceration. FINDINGS: This study is mildly motion  degraded. BONES AND ALIGNMENT: Normal lumbar segmentation, concordant with the thoracic numbering today. Osteopenia. Lumbar vertebral height and lordosis are within normal limits. Lower thoracic levels are reported separately today. Visible sacrum and SI joints appear intact. No acute osseous abnormality. DEGENERATIVE CHANGES: Advanced lower lumbar spine degeneration L3-L4 through L5-S1 with severe disc space loss, vacuum disc, moderate and severe posterior element hypertrophy. Subsequently there is CT evidence of severe multifactorial spinal stenosis at both L3-L4 (series 9 image 77) and L4-L5 (series 9 image 92). SOFT TISSUES: Advanced calcified aortoiliac atherosclerosis. Otherwise negative visible non-contrast abdominal viscera. Lower lumbar paraspinal muscle atrophy. IMPRESSION: 1. Mildly motion degraded, No acute traumatic injury identified in the lumbar spine. 2. Advanced lower lumbar degeneration with severe spinal stenosis at L3-L4 and L4-L5. Electronically signed by: Helayne Hurst MD 06/17/2024 01:08 PM EST RP Workstation: [id number]   CT Thoracic Spine Wo Contrast Result Date: 06/17/2024 EXAM: CT THORACIC SPINE WITHOUT CONTRAST 06/17/2024 12:46:42 PM TECHNIQUE: CT of the thoracic spine was performed without the administration of intravenous contrast. Multiplanar reformatted images are provided for review. Automated exposure control, iterative reconstruction, and/or weight based adjustment of the mA/kV was utilized to reduce the radiation dose to as low as reasonably achievable. COMPARISON: 2 view chest radiographs 08/21/2023. CLINICAL HISTORY: 89 year old female with back trauma, no prior imaging. Head trauma, moderate-severe, fall while on blood thinners, posterior head laceration. FINDINGS: This exam is mildly motion degraded. Thoracic segmentation appears to be normal. BONES AND ALIGNMENT: Mildly exaggerated thoracic kyphosis in the setting of chronic T7 and T11 compression fractures which appear  stable from last year. Normal vertebral body heights except for chronic T7 and T11 compression fractures. No acute fracture or suspicious bone lesion. Grossly intact visible posterior ribs. SOFT TISSUES: Partially visible cardiac pacemaker leads. Advanced calcified aortic atherosclerosis. Combined patchy and confluent bilateral lower lobe peribronchial and dependent opacity plus trace layering right pleural effusion. Right lower lobe appearance indeterminate for atelectasis versus infection. Negative non-contrast thoracic paraspinal soft tissues. Visible upper abdominal viscera. DEGENERATIVE: Age appropriate thoracic spine degeneration. No high grade thoracic spinal stenosis by CT. IMPRESSION: 1. No acute thoracic osseous abnormality. Stable chronic T7 and T11 compression fractures. 2. Right > left lower lobe opacities, indeterminate for atelectasis versus infection. Trace right pleural effusion. Electronically signed by: Helayne Hurst MD 06/17/2024 01:04 PM EST RP Workstation: [id number]   CT HEAD WO CONTRAST Result Date: 06/17/2024 EXAM: CT HEAD WITHOUT CONTRAST 06/17/2024 12:46:42 PM TECHNIQUE: CT of the head was performed without the administration of intravenous contrast.  Automated exposure control, iterative reconstruction, and/or weight based adjustment of the mA/kV was utilized to reduce the radiation dose to as low as reasonably achievable. COMPARISON: Head CT 10/29/2023 and 11/06/2023. CLINICAL HISTORY: 89 year old female with head trauma, moderate-severe, fall while on blood thinners, and posterior head laceration. FINDINGS: BRAIN AND VENTRICLES: No acute hemorrhage. No evidence of acute infarct. No hydrocephalus. No extra-axial collection. No mass effect or midline shift. Stable brain volume. Patchy chronic cerebral white matter hypodensity. Stable gray-white differentiation. No suspicious intracranial vascular hyperdensity. Advanced calcified atherosclerosis at the skull base. ORBITS: No acute  abnormality. SINUSES: Chronic left maxillary sinus disease with progressive bilateral paranasal sinus opacification since last year, but appears most likely inflammatory. Tympanic cavities and mastoids remain well aerated. SOFT TISSUES AND SKULL: Left posterior convexity broad based scalp soft tissue swelling and irregularity with some areas of scalp soft tissue gas indicating laceration. Underlying skull appears stable and intact. IMPRESSION: 1. Left posterior convexity scalp soft tissue injury. 2. No acute intracranial abnormality identified. 3. Progressive paranasal sinus opacification, favor inflammatory rather than post-traumatic. Electronically signed by: Helayne Hurst MD 06/17/2024 12:58 PM EST RP Workstation: [id number]   DG Pelvis Portable Result Date: 06/17/2024 CLINICAL DATA:  [facility]. EXAM: PORTABLE PELVIS 1-2 VIEWS COMPARISON:  Pelvis CT dated 11/06/2023 and right femur radiographs dated 01/17/2024. FINDINGS: Again demonstrated is hardware fixation of a previously demonstrated comminuted right intertrochanteric fracture. Unchanged position and alignment. No acute fracture or dislocation. Multiple gallstones in the gallbladder measuring up to 10 mm in maximum diameter each without correction for magnification. Lower lumbar spine degenerative changes. IMPRESSION: 1. No acute fracture or dislocation. 2. Cholelithiasis. Electronically Signed   By: Elspeth Bathe M.D.   On: 06/17/2024 12:15   DG Chest Port 1 View Result Date: 06/17/2024 CLINICAL DATA:  [facility]. EXAM: PORTABLE CHEST 1 VIEW COMPARISON:  11/11/2023 FINDINGS: Stable enlarged cardiac silhouette. Tortuous and partially calcified thoracic aorta. No significant change in linear atelectasis or scarring at the left lung base. Interval mild patchy density at the right lung base. Stable small right mid upper lung zone calcified granuloma. Left subclavian bipolar pacemaker leads in satisfactory position. Moderate right glenohumeral degenerative changes.  IMPRESSION: 1. Interval mild patchy atelectasis, pneumonia or pulmonary hemorrhage at the right lung base. 2. Stable cardiomegaly. Electronically Signed   By: Elspeth Bathe M.D.   On: 06/17/2024 12:12    Assessment/Plan: Acute hypoxic respiratory failure (HCC) Hospitalized 06/17/24-18/26 for acute hypoxic respiratory failure-resolved, will complete 3 days Cefuroxime , Doxycycline  in [facility] for R lung base PNA  Scalp laceration, sequela s/p fall, remove scalp staples 7 days.  Congestive heart failure (HCC) not apparent, no increased respiratory symptoms, no O2 desaturation, weight fluctuating, at baseline as well.   Recurrent depression  Depression scored 2 on recent PHQ-9.  Tolerating Lexapro  well, Na 140 03/12/2024  Major neurocognitive disorder (HCC) Senile dementia, tolerated Namenda  well, helped with her mood as well. MMSE 27/30 04/09/23, CT head 02/12/22 Mild age-related atrophy and chronic microvascular ischemic changes. Also taking Prevagen  Brady-tachy syndrome (HCC) S/p pace maker  Persistent atrial fibrillation (HCC) Heart rate is in control off Metoprolol , on Amiodarone , followed by Cardiology, on  Eliquis .  Hypertension Bp runs low  off Metoprolol  05/27/23 2/2 low Bp, on Furosemide ,  f/u Cardiology. Bun/creat 15/0.99 06/17/24  CKD (chronic kidney disease) stage 3, GFR 30-59 ml/min (HCC) Bun/creat 15/0.99 06/17/24  Esophageal dysphagia nectar liquid.     Family/ staff Communication: plan of care reviewed with the patient and charge nurse.  Labs/tests ordered:  none "

## 2024-06-19 NOTE — Assessment & Plan Note (Signed)
"   Depression scored 2 on recent PHQ-9.  Tolerating Lexapro  well, Na 140 03/12/2024 "

## 2024-06-19 NOTE — Assessment & Plan Note (Signed)
 on Levothyroxine , TSH 2.13 05/05/24

## 2024-06-19 NOTE — Assessment & Plan Note (Signed)
 not apparent, no increased respiratory symptoms, no O2 desaturation, weight fluctuating, at baseline as well.

## 2024-06-19 NOTE — Assessment & Plan Note (Signed)
 Bp runs low  off Metoprolol  05/27/23 2/2 low Bp, on Furosemide ,  f/u Cardiology. Bun/creat 15/0.99 06/17/24

## 2024-06-19 NOTE — Assessment & Plan Note (Signed)
 Senile dementia, tolerated Namenda  well, helped with her mood as well. MMSE 27/30 04/09/23, CT head 02/12/22 Mild age-related atrophy and chronic microvascular ischemic changes. Also taking Prevagen

## 2024-06-19 NOTE — Assessment & Plan Note (Signed)
 Hospitalized 06/17/24-18/26 for acute hypoxic respiratory failure-resolved, will complete 3 days Cefuroxime , Doxycycline  in SN[facility] for R lung base PNA

## 2024-06-19 NOTE — Assessment & Plan Note (Signed)
 Heart rate is in control off Metoprolol , on Amiodarone , followed by Cardiology, on  Eliquis .

## 2024-06-19 NOTE — Assessment & Plan Note (Signed)
 S/p pacemaker

## 2024-06-19 NOTE — Assessment & Plan Note (Signed)
 s/p fall, remove scalp staples 7 days.

## 2024-06-19 NOTE — Assessment & Plan Note (Signed)
 Bun/creat 15/0.99 06/17/24

## 2024-06-19 NOTE — Assessment & Plan Note (Signed)
nectar liquid 

## 2024-07-10 ENCOUNTER — Ambulatory Visit: Payer: Self-pay | Admitting: Cardiology

## 2024-07-10 ENCOUNTER — Encounter: Payer: Self-pay | Admitting: Nurse Practitioner

## 2024-07-10 ENCOUNTER — Non-Acute Institutional Stay (SKILLED_NURSING_FACILITY): Payer: Self-pay | Admitting: Nurse Practitioner

## 2024-07-10 ENCOUNTER — Ambulatory Visit: Attending: Cardiology

## 2024-07-10 DIAGNOSIS — E039 Hypothyroidism, unspecified: Secondary | ICD-10-CM | POA: Diagnosis not present

## 2024-07-10 DIAGNOSIS — F339 Major depressive disorder, recurrent, unspecified: Secondary | ICD-10-CM | POA: Diagnosis not present

## 2024-07-10 DIAGNOSIS — I509 Heart failure, unspecified: Secondary | ICD-10-CM

## 2024-07-10 DIAGNOSIS — N1831 Chronic kidney disease, stage 3a: Secondary | ICD-10-CM

## 2024-07-10 DIAGNOSIS — R1319 Other dysphagia: Secondary | ICD-10-CM

## 2024-07-10 DIAGNOSIS — K5901 Slow transit constipation: Secondary | ICD-10-CM

## 2024-07-10 DIAGNOSIS — K219 Gastro-esophageal reflux disease without esophagitis: Secondary | ICD-10-CM

## 2024-07-10 DIAGNOSIS — I4819 Other persistent atrial fibrillation: Secondary | ICD-10-CM | POA: Diagnosis not present

## 2024-07-10 DIAGNOSIS — F0390 Unspecified dementia without behavioral disturbance: Secondary | ICD-10-CM | POA: Diagnosis not present

## 2024-07-10 DIAGNOSIS — M150 Primary generalized (osteo)arthritis: Secondary | ICD-10-CM

## 2024-07-10 DIAGNOSIS — I10 Essential (primary) hypertension: Secondary | ICD-10-CM | POA: Diagnosis not present

## 2024-07-10 DIAGNOSIS — I480 Paroxysmal atrial fibrillation: Secondary | ICD-10-CM

## 2024-07-10 LAB — CUP PACEART REMOTE DEVICE CHECK
Battery Remaining Longevity: 26 mo
Battery Remaining Percentage: 21 %
Battery Voltage: 2.9 V
Brady Statistic AP VP Percent: 2.4 %
Brady Statistic AP VS Percent: 76 %
Brady Statistic AS VP Percent: 1 %
Brady Statistic AS VS Percent: 22 %
Brady Statistic RA Percent Paced: 78 %
Brady Statistic RV Percent Paced: 2.4 %
Date Time Interrogation Session: 20260130040014
Implantable Lead Connection Status: 753985
Implantable Lead Connection Status: 753985
Implantable Lead Implant Date: 20091109
Implantable Lead Implant Date: 20091109
Implantable Lead Location: 753859
Implantable Lead Location: 753860
Implantable Lead Model: 350
Implantable Lead Serial Number: 24891460
Implantable Lead Serial Number: 28411861
Implantable Pulse Generator Implant Date: 20170227
Lead Channel Impedance Value: 1300 Ohm
Lead Channel Impedance Value: 490 Ohm
Lead Channel Pacing Threshold Amplitude: 0.75 V
Lead Channel Pacing Threshold Amplitude: 0.75 V
Lead Channel Pacing Threshold Pulse Width: 0.4 ms
Lead Channel Pacing Threshold Pulse Width: 0.4 ms
Lead Channel Sensing Intrinsic Amplitude: 1.1 mV
Lead Channel Sensing Intrinsic Amplitude: 12 mV
Lead Channel Setting Pacing Amplitude: 2 V
Lead Channel Setting Pacing Amplitude: 2.5 V
Lead Channel Setting Pacing Pulse Width: 0.4 ms
Lead Channel Setting Sensing Sensitivity: 2 mV
Pulse Gen Model: 2240
Pulse Gen Serial Number: 7885781

## 2024-07-10 NOTE — Assessment & Plan Note (Signed)
"    CHF, AS, chronic edema BLE, on Furosemide . EF 70-75% 09/19/21, 03/27/22 Venous US , negative DVT. 03/30/22 arterial US  mild to moderate obstructive disease "

## 2024-07-10 NOTE — Assessment & Plan Note (Signed)
 takes Tylenol , ambulates with walker prior to fx of R hip 11/06/23, w/c for mobility now.

## 2024-07-10 NOTE — Assessment & Plan Note (Signed)
 Blood pressure is controlled  off Metoprolol  05/27/23 2/2 low Bp, on Furosemide ,  f/u Cardiology. Bun/creat 15/0.99 06/17/24

## 2024-07-10 NOTE — Assessment & Plan Note (Signed)
 Heart rate is in control off Metoprolol , on Amiodarone , followed by Cardiology, on  Eliquis .

## 2024-07-10 NOTE — Assessment & Plan Note (Signed)
 Bun/creat 15/0.99 06/17/24

## 2024-07-10 NOTE — Assessment & Plan Note (Signed)
 Senile dementia, tolerated Namenda  well, helped with her mood as well. MMSE 27/30 04/09/23, CT head 02/12/22 Mild age-related atrophy and chronic microvascular ischemic changes. Also taking Prevagen

## 2024-07-10 NOTE — Assessment & Plan Note (Signed)
 Stable, taking Senokot S, MiraLax 

## 2024-07-10 NOTE — Assessment & Plan Note (Signed)
 on Levothyroxine , TSH 2.13 05/05/24

## 2024-07-10 NOTE — Assessment & Plan Note (Signed)
 Her mood is stable  Depression scored 2 on recent PHQ-9.  Tolerating Lexapro  well, Na 141 06/17/24

## 2024-07-10 NOTE — Assessment & Plan Note (Signed)
nectar liquid 

## 2024-07-13 ENCOUNTER — Other Ambulatory Visit: Payer: Self-pay

## 2024-07-13 ENCOUNTER — Emergency Department (HOSPITAL_COMMUNITY): Admission: EM | Admit: 2024-07-13 | Discharge: 2024-07-14 | Disposition: A | Discharge status: Skilled Nursing Facility

## 2024-07-13 ENCOUNTER — Emergency Department (HOSPITAL_COMMUNITY)

## 2024-07-13 DIAGNOSIS — R519 Headache, unspecified: Secondary | ICD-10-CM | POA: Insufficient documentation

## 2024-07-13 DIAGNOSIS — Z79890 Hormone replacement therapy (postmenopausal): Secondary | ICD-10-CM | POA: Insufficient documentation

## 2024-07-13 DIAGNOSIS — Z7901 Long term (current) use of anticoagulants: Secondary | ICD-10-CM | POA: Insufficient documentation

## 2024-07-13 DIAGNOSIS — E039 Hypothyroidism, unspecified: Secondary | ICD-10-CM | POA: Insufficient documentation

## 2024-07-13 DIAGNOSIS — R079 Chest pain, unspecified: Secondary | ICD-10-CM | POA: Insufficient documentation

## 2024-07-13 DIAGNOSIS — I4891 Unspecified atrial fibrillation: Secondary | ICD-10-CM | POA: Insufficient documentation

## 2024-07-13 LAB — BASIC METABOLIC PANEL WITH GFR
Anion gap: 10 (ref 5–15)
BUN: 18 mg/dL (ref 8–23)
CO2: 21 mmol/L — ABNORMAL LOW (ref 22–32)
Calcium: 7.3 mg/dL — ABNORMAL LOW (ref 8.9–10.3)
Chloride: 111 mmol/L (ref 98–111)
Creatinine, Ser: 0.7 mg/dL (ref 0.44–1.00)
GFR, Estimated: >60 mL/min
Glucose, Bld: 71 mg/dL (ref 70–99)
Potassium: 3.2 mmol/L — ABNORMAL LOW (ref 3.5–5.1)
Sodium: 142 mmol/L (ref 135–145)

## 2024-07-13 LAB — CBC
HCT: 33.8 % — ABNORMAL LOW (ref 36.0–46.0)
Hemoglobin: 10.6 g/dL — ABNORMAL LOW (ref 12.0–15.0)
MCH: 32.8 pg (ref 26.0–34.0)
MCHC: 31.4 g/dL (ref 30.0–36.0)
MCV: 104.6 fL — ABNORMAL HIGH (ref 80.0–100.0)
Platelets: 78 10*3/uL — ABNORMAL LOW (ref 150–400)
RBC: 3.23 MIL/uL — ABNORMAL LOW (ref 3.87–5.11)
RDW: 13.2 % (ref 11.5–15.5)
WBC: 3.9 10*3/uL — ABNORMAL LOW (ref 4.0–10.5)
nRBC: 0 % (ref 0.0–0.2)

## 2024-07-13 LAB — TROPONIN T, HIGH SENSITIVITY
Troponin T High Sensitivity: 20 ng/L — ABNORMAL HIGH (ref 0–19)
Troponin T High Sensitivity: 18 ng/L (ref 0–19)

## 2024-07-13 MED ORDER — POTASSIUM CHLORIDE CRYS ER 20 MEQ PO TBCR
40.0000 meq | EXTENDED_RELEASE_TABLET | Freq: Once | ORAL | Status: AC
  Administered 2024-07-13: 40 meq via ORAL
  Filled 2024-07-13: qty 2

## 2024-07-13 NOTE — Discharge Instructions (Signed)
 Your workup today was reassuring.  If you have recurrent symptoms try Tylenol .  If your symptoms are persisting please return to the ER for reevaluation.

## 2024-07-13 NOTE — ED Provider Triage Note (Signed)
 Emergency Medicine Provider Triage Evaluation Note  [name] , a 89 y.o. female  was evaluated in triage.  Pt complains of headache and chest pain which is since resolved.  Started earlier today.  Reports she is feeling better on arrival to the emergency department.  Review of Systems  Positive: See above  Negative: Focal weakness  Physical Exam  BP (!) 151/57   Pulse 65   Temp 98.1 F (36.7 C) (Oral)   Resp 16   SpO2 95%  Gen:   Awake, no distress   Resp:  Normal effort  MSK:   Moves extremities without difficulty  Other:  No focal since  Medical Decision Making  Medically screening exam initiated at 6:10 PM.  Appropriate orders placed.  [name] was informed that the remainder of the evaluation will be completed by another provider, this initial triage assessment does not replace that evaluation, and the importance of remaining in the ED until their evaluation is complete.     [name] Prentice SAUNDERS, MD 07/13/24 [phone]

## 2024-07-13 NOTE — ED Notes (Signed)
 Family states patient is wheelchair bound and does not walk. [facility] called for transport.

## 2024-07-14 NOTE — ED Notes (Signed)
 [facility]

## 2024-07-15 ENCOUNTER — Encounter: Payer: Self-pay | Admitting: Nurse Practitioner

## 2024-07-16 ENCOUNTER — Encounter: Payer: Self-pay | Admitting: Nurse Practitioner

## 2024-07-16 ENCOUNTER — Non-Acute Institutional Stay: Payer: Self-pay | Admitting: Nurse Practitioner

## 2024-07-16 DIAGNOSIS — I495 Sick sinus syndrome: Secondary | ICD-10-CM

## 2024-07-16 DIAGNOSIS — K5901 Slow transit constipation: Secondary | ICD-10-CM

## 2024-07-16 DIAGNOSIS — F0390 Unspecified dementia without behavioral disturbance: Secondary | ICD-10-CM

## 2024-07-16 DIAGNOSIS — E876 Hypokalemia: Secondary | ICD-10-CM

## 2024-07-16 DIAGNOSIS — I480 Paroxysmal atrial fibrillation: Secondary | ICD-10-CM

## 2024-07-16 DIAGNOSIS — I509 Heart failure, unspecified: Secondary | ICD-10-CM

## 2024-07-16 DIAGNOSIS — K219 Gastro-esophageal reflux disease without esophagitis: Secondary | ICD-10-CM

## 2024-07-16 DIAGNOSIS — I10 Essential (primary) hypertension: Secondary | ICD-10-CM

## 2024-07-16 DIAGNOSIS — E039 Hypothyroidism, unspecified: Secondary | ICD-10-CM

## 2024-07-16 DIAGNOSIS — D509 Iron deficiency anemia, unspecified: Secondary | ICD-10-CM

## 2024-07-16 DIAGNOSIS — F339 Major depressive disorder, recurrent, unspecified: Secondary | ICD-10-CM

## 2024-07-16 NOTE — Assessment & Plan Note (Signed)
 post op, off Iron , Hgb 10.6 07/13/24

## 2024-07-16 NOTE — Progress Notes (Unsigned)
 " Location:  [facility] Room Number: VOLNEY CERT [id number] Place of Service:  SNF [phone]) Provider:  Adline Lysle OLEGARIO CARROLYN Charlanne Fredia LITTIE, MD  Patient Care Team: Charlanne Fredia LITTIE, MD as PCP - General (Internal Medicine) [name], FNP (Hospice and Palliative Medicine) [name], MD (Inactive) as Consulting Physician (Cardiology) Loni Soyla LABOR, MD as Consulting Physician (Cardiology)  Extended Emergency Contact Information Primary Emergency Contact: [facility] Phone: [phone] Mobile Phone: [phone] Relation: Daughter Interpreter needed? No Secondary Emergency Contact: Pearman,Karen Address: [address]          [place], [place] [address] [place] Home Phone: [phone] Mobile Phone: [phone] Relation: Daughter  Code Status:  DNR Goals of care: Advanced Directive information    07/16/2024   10:59 AM  Advanced Directives  Does Patient Have a Medical Advance Directive? Yes  Type of Estate Agent of [place];Living will;Out of facility DNR (pink MOST or yellow form)  Does patient want to make changes to medical advance directive? No - Patient declined  Copy of Healthcare Power of Attorney in Chart? Yes - validated most recent copy scanned in chart (See row information)  Pre-existing out of facility DNR order (yellow form or pink MOST form) Pink MOST/Yellow Form most recent copy in chart - Physician notified to receive inpatient order     Chief Complaint  Patient presents with   Follow-up    ED Follow up.    HPI:  Pt is a 89 y.o. female seen today for f/u ED eval 07/13/24 for headache, chest pain, fatigue  07/13/24 Ed eval, resolved headache, chest pain, fatigue. BMP, CBC, troponin, CXR, CT head, EKG unremarkable, except K 3.2 07/13/24, supplemented.    Hospitalized 06/17/24-18/26 for acute hypoxic respiratory failure, s/p fall, completed 3 days Cefuroxime , Doxycycline  in SNF [facility] for R lung  base PNA, removed scalp staples 7 days.              Glaucoma, right eye, Combigan  ophthalmic solution                           Depression scored 2 on recent PHQ-9.  Tolerating Lexapro  well, Na 142 07/13/24              Hospitalized 11/06/23-11/12/23 for closed displaced intertrochanteric fx of R femur, s/p IM, WBAT. Hospital stay was complicated with acute blood loss anemia and acute respiratory failure with hypoxia.                BRBPR 05/23/23, FOBT positive 05/24/23, noted hemorrhoids, placed PPI, Hgb 10.6 07/13/24              01/11/23 family expressed concerning of hand and fingers tingling and numbness, desires checking TSH 4.38 01/26/23 If Namenda  is the culprit. The patient stated her tingling and numbness in hands and fingers has been the same, declined workup or neurology referral.              R heel, pressure ulcer, improving, scab over             Chronic numbness of R+L hands, has been using fingerless gloves which helped, declined workup, neurology referral, or medication             Anemia, post op, off Iron , Hgb 10.6 07/13/24             Senile dementia, taking  Namenda , Prvagen, helped with her mood  as well. CT head 07/13/24 Stable atrophy and chronic small vessel ischemia.             Afib, off Metoprolol , on Amiodarone , followed by Cardiology, on  Eliquis .             AS moderate             HTN off Metoprolol ,on Furosemide ,  f/u Cardiology.              Pacemaker, SSS,  f/u Cardiology             CHF, AS, chronic edema BLE, on Furosemide . EF >75 09/14/23 03/27/22 Venous US , negative DVT. 03/30/22 arterial US  mild to moderate obstructive disease             CKD Bun/creat 18/0.7 07/13/24             Dysphagia, nectar liquid.              Hypothyroidism, on Levothyroxine , TSH 2.13 05/05/24             OA, takes Tylenol , ambulates with walker prior to fx of R hip 11/06/23, w/c for mobility now.              GERD, stable, off Pantoprazole.              Constipation, taking Senokot S,  MiraLax   Past Medical History:  Diagnosis Date   Aortic stenosis 03/08/2016   Brady-tachy syndrome (HCC)    a. Biotronik dual chamber PPM implanted 2009 b. gen change to STJ dual chamber PPM 2017   Ejection fraction    EF 70%, echo, October, 2009  //   EF 55-60%, septal dyssynergy consistent with a paced rhythm, mild to moderate mitral regurgitation, echo, November, 2015    GERD (gastroesophageal reflux disease) 04/28/2014   Episodes November, 2015 with fluid refluxing from her esophagus.    Hair loss    Patient questioned Coumadin, changed toPradaxa   Hypertension    Hypothyroidism    Lower extremity edema 01/22/2018   Mitral regurgitation 05/03/2014   Mild-to-moderate mitral regurgitation, echo, November, 2015    Osteoarthritis of left knee 10/25/2017   Paroxysmal atrial fibrillation (HCC)    Episodes rapid atrial fib noted by pacemaker interrogation, August, 2011, diltiazem  added, patient improved. Patient continues on Rythmol   //   Changed to flecainide  2013  //   flecainide  level checked in 2013, good level    Persistent atrial fibrillation (HCC)    Presence of permanent cardiac pacemaker    Past Surgical History:  Procedure Laterality Date   ABDOMINAL HYSTERECTOMY     EP IMPLANTABLE DEVICE N/A 08/08/2015   Generator change with SJM Assurity DR PPM by Dr Kelsie   FEMUR IM NAIL Right 11/07/2023   Procedure: INSERTION, INTRAMEDULLARY ROD, FEMUR, RETROGRADE;  Surgeon: Genelle Standing, MD;  Location: [facility] OR;  Service: Orthopedics;  Laterality: Right;   JOINT REPLACEMENT     PACEMAKER PLACEMENT  2009        Allergies Allergen Reactions   Morphine And Codeine Other (See Comments)    Patient states that she went crazy with this   Penicillins Swelling and Rash    Has patient had a PCN reaction causing immediate rash, facial/tongue/throat swelling, SOB or lightheadedness with hypotension: {Yes/No:30480221} Has patient had a PCN reaction causing severe rash involving mucus  membranes or skin necrosis: {Yes/No:30480221} Has patient had a PCN reaction that required hospitalization {Yes/No:30480221} Has patient had a PCN reaction occurring within  the last 10 years: {Yes/No:30480221} If all of the above answers are NO, then may proceed with Cephalosporin use.   Aspirin      Pacemaker   Clindamycin/Lincomycin Other (See Comments)    Any meds with mycin in the name Unknown reaction Not listed on the [facility]   Crestor  [Rosuvastatin ]     Other reaction(s): muscle pain Not listed on the [facility]   Diltiazem      Skin discoloration, lower extremity swelling Not listed on the [facility]   Erythromycin Other (See Comments)    Any meds with mycin in the name Unknown reaction Not listed on the [facility]   Lisinopril Other (See Comments)    REACTION: Cough   Metronidazole     Other reaction(s): mouth sores, tongue swelling   Morphine    Penicillins Cross Reactors Other (See Comments)    Any meds with mycin in the name Unknown reaction    Outpatient Encounter Medications as of 07/16/2024  Medication Sig   acetaminophen  (TYLENOL ) 325 MG tablet Take 650 mg by mouth See admin instructions. 2 tablets by mouth two times daily and 2 tablets every 4 hours as needed for pain or fever   amiodarone  (PACERONE ) 200 MG tablet Take 200 mg by mouth daily.   apixaban  (ELIQUIS ) 5 MG TABS tablet Take 1 tablet (5 mg total) by mouth 2 (two) times daily.   Apoaequorin (PREVAGEN) 10 MG CAPS Take 10 mg by mouth daily.   brimonidine -timolol  (COMBIGAN ) 0.2-0.5 % ophthalmic solution Place 1 drop into the right eye every 12 (twelve) hours.   Calcium  Carbonate-Vit D-Min (CALCIUM  600+D PLUS MINERALS) 600-400 MG-UNIT TABS Take 1 tablet by mouth daily.   escitalopram  (LEXAPRO ) 5 MG tablet Take 5 mg by mouth daily.   furosemide  (LASIX ) 20 MG tablet TAKE ONE TABLET BY MOUTH EVERY DAY   levothyroxine  (SYNTHROID ) 137 MCG tablet Take 137 mcg by mouth daily before breakfast.   lidocaine  (HM LIDOCAINE  PATCH) 4 % Place 1  patch onto the skin in the morning. Apply topically to L shoulder in the morning. Apply for 12 hours (0900) then removed for 12 hours (2100).   loperamide (IMODIUM A-D) 2 MG tablet Take 2 mg by mouth 3 (three) times daily as needed for diarrhea or loose stools.   melatonin 5 MG TABS Take 1 tablet (5 mg total) by mouth at bedtime.   Multiple Vitamin (MULTIVITAMIN ADULT) TABS Take 1 tablet by mouth in the morning.   Multiple Vitamins-Minerals (OCUVITE PO) Take 1 capsule by mouth in the morning.   NAMENDA  10 MG tablet TAKE ONE TABLET BY MOUTH TWICE A DAY FOR COGNITIVE DECLINE   naphazoline-pheniramine (NAPHCON-A) 0.025-0.3 % ophthalmic solution Place 1 drop into both eyes 2 (two) times daily as needed for eye irritation (Q12 hours as needed).   ondansetron  (ZOFRAN ) 4 MG tablet Take 4 mg by mouth every 8 (eight) hours as needed for nausea or vomiting.   oxyCODONE  (ROXICODONE ) 5 MG immediate release tablet Take 1 tablet (5 mg total) by mouth every 6 (six) hours as needed for severe pain (pain score 7-10).   Polyethyl Glyc-Propyl Glyc PF (SYSTANE HYDRATION PF) 0.4-0.3 % SOLN Place 1 drop into the right eye 4 (four) times daily.   polyethylene glycol (MIRALAX  / GLYCOLAX ) 17 g packet Take 17 g by mouth every other day.   potassium chloride  (KLOR-CON  M) 10 MEQ tablet Take 1 tablet (10 mEq total) by mouth daily.   sennosides-docusate sodium  (SENOKOT-S) 8.6-50 MG tablet Take 1 tablet by mouth daily.  No facility-administered encounter medications on file as of 07/16/2024.    Review of Systems  Constitutional:  Negative for appetite change, fatigue and fever.  HENT:  Positive for hearing loss and trouble swallowing. Negative for congestion.   Eyes:  Negative for visual disturbance.  Respiratory:  Negative for cough, chest tightness and shortness of breath.   Cardiovascular:  Positive for leg swelling.  Gastrointestinal:  Negative for abdominal pain and constipation.  Genitourinary:  Negative for dysuria.   Musculoskeletal:  Positive for arthralgias and gait problem.       Knees, L>R, intermittent in nature.  Left shoulder pain with ROM  Skin:  Negative for wound.       Chronic venous insufficiency skin changes: mild erythema in mid of R+L Right heel pressure ulcer wound, scabbed over   Neurological:  Positive for numbness. Negative for weakness and headaches.       Memory lapses. chronic numbness in fingers.   Psychiatric/Behavioral:  Negative for confusion and sleep disturbance. The patient is not nervous/anxious.        May be less smiling than usual upon my examination today?    Immunization History  Administered Date(s) Administered   Fluad Quad(high Dose 65+) 04/02/2022   Fluzone Influenza virus vaccine,trivalent (IIV3), split virus 02/24/2019, 03/08/2020, 03/25/2021   INFLUENZA, HIGH DOSE SEASONAL PF 03/08/2015, 04/03/2017, 03/17/2018, 03/08/2020, 04/15/2023   Influenza Split 03/11/2009, 03/11/2010, 02/25/2011   Influenza,inj,Quad PF,6+ Mos 03/08/2016   Influenza,inj,quad, With Preservative 03/11/2017   Influenza-Unspecified 03/17/2024   Moderna Covid-19 Vaccine  Bivalent Booster 72yrs & up 10/27/2021, 07/11/2023   Moderna SARS-COV2 Booster Vaccination 11/08/2020   Moderna Sars-Covid-2 Vaccination 06/15/2019, 07/13/2019, 04/19/2020, 02/27/2022   PNEUMOCOCCAL CONJUGATE-20 06/27/2023   Pfizer Covid-19 Vaccine Bivalent Booster 76yrs & up 02/28/2021, 04/12/2022   Pneumococcal Conjugate-13 12/06/2014   Pneumococcal Polysaccharide-23 05/13/2001, 03/11/2009   Polio, Unspecified 02/02/1958   RSV,unspecified 03/05/2024   Tdap 11/21/2011, 04/11/2023   Unspecified SARS-COV-2 Vaccination 04/06/2024   Zoster Recombinant(Shingrix) 07/18/2022, 10/19/2022   Pertinent  Health Maintenance Due  Topic Date Due   Influenza Vaccine  Completed   Bone Density Scan  Completed      02/16/2022    9:45 AM 02/27/2022   11:41 AM 07/06/2022   10:04 AM 07/13/2022    3:55 PM 03/25/2023    4:51 PM  Fall  Risk  Falls in the past year?   0 0 1  Was there an injury with Fall?   0  0  0   Fall Risk Category Calculator   0 0 1  (RETIRED) Patient Fall Risk Level High fall risk  High fall risk      Patient at Risk for Falls Due to  History of fall(s) No Fall Risks No Fall Risks History of fall(s);Impaired balance/gait;Impaired mobility  Fall risk Follow up  Falls evaluation completed   Falls evaluation completed Falls evaluation completed;Falls prevention discussed;Education provided     Data saved with a previous flowsheet row definition   Functional Status Survey:    Vitals:   07/16/24 1056  BP: 135/76  Pulse: 86  Resp: 20  Temp: (!) 97.2 F (36.2 C)  SpO2: 91%  Weight: 182 lb 6.4 oz (82.7 kg)  Height: 5' 3 (1.6 m)   Body mass index is 32.31 kg/m. Physical Exam Vitals and nursing note reviewed.  Constitutional:      Appearance: Normal appearance.  HENT:     Head: Normocephalic and atraumatic.     Nose: Nose normal.  Mouth/Throat:     Mouth: Mucous membranes are moist.  Eyes:     Extraocular Movements: Extraocular movements intact.     Pupils: Pupils are equal, round, and reactive to light.  Cardiovascular:     Rate and Rhythm: Normal rate. Rhythm irregular.     Heart sounds: Murmur heard.     Comments: No dorsalis pedis pulses felt from previous examination.  Pulmonary:     Effort: Pulmonary effort is normal.     Breath sounds: No rales.  Abdominal:     General: Bowel sounds are normal.     Palpations: Abdomen is soft.     Tenderness: There is no abdominal tenderness.  Genitourinary:    Comments: External hemorrhoids from previous examination Musculoskeletal:        General: No tenderness. Normal range of motion.     Cervical back: Normal range of motion and neck supple.     Right lower leg: No edema.     Left lower leg: No edema.     Comments: Tract to 1+ edema BLE Left shoulder pain with ROM R hip pain  Skin:    General: Skin is warm and dry.      Findings: No bruising.     Comments: Chronic venous insufficiency skin changes: mild erythema in mid of R+L R hip surgical scar Right heel pressure ulcer wound, scabbed over    Neurological:     General: No focal deficit present.     Mental Status: She is alert. Mental status is at baseline.     Gait: Gait abnormal.     Comments: 5/5 muscle strength in fingers.  Oriented to person, her room on unit.   Psychiatric:        Mood and Affect: Mood normal.        Behavior: Behavior normal.     Labs reviewed: Recent Labs    11/11/23 0505 11/12/23 0608 11/19/23 0000 03/12/24 0000 06/17/24 1219 06/18/24 0123 07/13/24 1846  NA 139 138   < > 140 141  --  142  K 3.4* 4.9   < > 4.2 4.2  --  3.2*  CL 101 102   < > 102 104  --  111  CO2 31 31   < > 32* 28  --  21*  GLUCOSE 121* 105*  --   --  110*  --  71  BUN 27* 20   < > 22* 15  --  18  CREATININE 0.68 0.70   < > 0.7 0.99  --  0.70  CALCIUM  8.0* 8.1*   < > 8.8 8.9  --  7.3*  MG 2.1 2.2  --   --   --  1.8  --   PHOS 2.6 2.4*  --   --   --  3.6  --    < > = values in this interval not displayed.   Recent Labs    11/11/23 0505 11/12/23 0608 11/19/23 0000 01/07/24 0000 03/10/24 0000 06/17/24 1219  AST 30 32   < > 25 19 36  ALT 16 18   < > 14 15 21   ALKPHOS 36* 38   < > 102 71 92  BILITOT 1.3* 1.7*  --   --   --  0.4  PROT 5.1* 5.2*  --   --   --  6.3*  ALBUMIN 2.6* 2.6*   < > 3.7 3.2* 3.3*   < > = values in this interval not displayed.  Recent Labs    11/12/23 0608 11/19/23 0000 11/22/23 1039 12/10/23 0000 03/10/24 0000 06/17/24 1219 07/13/24 1846  WBC 6.8   < > 7.3 5.4 9.3 5.7 3.9*  NEUTROABS 3.9  --   --  3,289.00 6,845.00  --   --   HGB 7.6*   < > 11.4 12.8 12.5 11.7* 10.6*  HCT 23.8*   < > 36.2 41 39 36.2 33.8*  MCV 105.3*  --  105*  --   --  100.0 104.6*  PLT 142*   < > 240 143* 97* 126* 78*   < > = values in this interval not displayed.   Lab Results  Component Value Date   TSH 2.13 05/05/2024   Lab  Results  Component Value Date   HGBA1C 4.8 11/08/2023   Lab Results  Component Value Date   CHOL 159 09/14/2023   HDL 42 09/14/2023   LDLCALC 100 (H) 09/14/2023   TRIG 85 09/14/2023   CHOLHDL 3.8 09/14/2023    Significant Diagnostic Results in last 30 days:  CT Head Wo Contrast Result Date: 07/13/2024 CLINICAL DATA:  89 year old with headache, increasing frequency or severity. EXAM: CT HEAD WITHOUT CONTRAST TECHNIQUE: Contiguous axial images were obtained from the base of the skull through the vertex without intravenous contrast. RADIATION DOSE REDUCTION: This exam was performed according to the departmental dose-optimization program which includes automated exposure control, adjustment of the mA and/or kV according to patient size and/or use of iterative reconstruction technique. COMPARISON:  Head CT 06/17/2024 FINDINGS: Brain: No intracranial hemorrhage, mass effect, or midline shift. Stable atrophy and chronic small vessel ischemia. No hydrocephalus. The basilar cisterns are patent. No evidence of territorial infarct or acute ischemia. No extra-axial or intracranial fluid collection. Vascular: Atherosclerosis of skullbase vasculature without hyperdense vessel or abnormal calcification. Skull: No fracture or focal lesion. Sinuses/Orbits: Improved paranasal sinus opacification. Residual opacification of scattered ethmoid air cells, left side of sphenoid sinus and left maxillary sinus. No mastoid effusion. Other: None. IMPRESSION: 1. No acute intracranial abnormality. 2. Stable atrophy and chronic small vessel ischemia. 3. Improved paranasal sinus opacification from prior exam, however persistent opacification of left sphenoid and maxillary sinus. Electronically Signed   By: Andrea Gasman M.D.   On: 07/13/2024 19:48   DG Chest Port 1 View Result Date: 07/13/2024 CLINICAL DATA:  Chest pain EXAM: PORTABLE CHEST 1 VIEW COMPARISON:  06/17/2024 FINDINGS: Single frontal view of the chest demonstrates  stable dual lead pacer. The cardiac silhouette is unremarkable. No acute airspace disease, effusion, or pneumothorax. No acute bony abnormalities. IMPRESSION: 1. No acute intrathoracic process. Electronically Signed   By: Ozell Daring M.D.   On: 07/13/2024 19:01    Assessment/Plan No problem-specific Assessment & Plan notes found for this encounter.     Family/ staff Communication: plan of care reviewed with the patient and charge nurse.   Labs/tests ordered:  update serum K level.      "

## 2024-07-16 NOTE — Progress Notes (Signed)
 Remote PPM Transmission

## 2024-07-16 NOTE — Assessment & Plan Note (Signed)
 on Levothyroxine , TSH 2.13 05/05/24

## 2024-07-16 NOTE — Assessment & Plan Note (Signed)
 off Metoprolol , on Amiodarone , followed by Cardiology, on  Eliquis .

## 2024-07-16 NOTE — Assessment & Plan Note (Signed)
 off Metoprolol ,on Furosemide ,  f/u Cardiology.  Bp runs low

## 2024-07-16 NOTE — Assessment & Plan Note (Signed)
 Stable, taking Senokot S, MiraLax 

## 2024-07-16 NOTE — Assessment & Plan Note (Signed)
 No longer ambulatory, w/c for mobility, pain is controlled with Tylenol .

## 2024-07-16 NOTE — Assessment & Plan Note (Signed)
 07/13/24 Ed eval, resolved headache, chest pain, fatigue. BMP, CBC, troponin, CXR, CT head, EKG unremarkable, except K 3.2 07/13/24, supplemented.  Update serum K level.

## 2024-07-16 NOTE — Assessment & Plan Note (Signed)
"   Depression scored 2 on recent PHQ-9.  Tolerating Lexapro  well, Na 142 07/13/24 Her mood is stable.  "

## 2024-07-16 NOTE — Assessment & Plan Note (Signed)
"   Senile dementia, taking  Namenda , Prvagen, helped with her mood as well. CT head 07/13/24 Stable atrophy and chronic small vessel ischemia. "

## 2024-07-16 NOTE — Assessment & Plan Note (Signed)
 AS, chronic edema BLE, on Furosemide . EF >75 09/14/23 03/27/22 Venous US , negative DVT. 03/30/22 arterial US  mild to moderate obstructive disease

## 2024-07-16 NOTE — Assessment & Plan Note (Signed)
 Pacemaker, SSS,  f/u Cardiology

## 2024-07-16 NOTE — Assessment & Plan Note (Signed)
Stable, off acid reducer 

## 2024-07-17 ENCOUNTER — Encounter: Payer: Self-pay | Admitting: Nurse Practitioner

## 2024-09-03 ENCOUNTER — Other Ambulatory Visit (HOSPITAL_COMMUNITY)

## 2024-10-09 ENCOUNTER — Ambulatory Visit

## 2025-01-08 ENCOUNTER — Ambulatory Visit

## 2025-04-09 ENCOUNTER — Ambulatory Visit

## 2025-07-09 ENCOUNTER — Ambulatory Visit
